# Patient Record
Sex: Male | Born: 1950 | Race: White | Hispanic: No | Smoking: Current every day smoker
Health system: Southern US, Community
[De-identification: ages and names within clinical notes are randomized; demographics above are authoritative.]

## PROBLEM LIST (undated history)

## (undated) DIAGNOSIS — G4700 Insomnia, unspecified: Secondary | ICD-10-CM

## (undated) DIAGNOSIS — K5520 Angiodysplasia of colon without hemorrhage: Secondary | ICD-10-CM

## (undated) DIAGNOSIS — E119 Type 2 diabetes mellitus without complications: Secondary | ICD-10-CM

## (undated) DIAGNOSIS — J449 Chronic obstructive pulmonary disease, unspecified: Secondary | ICD-10-CM

## (undated) DIAGNOSIS — M1990 Unspecified osteoarthritis, unspecified site: Secondary | ICD-10-CM

## (undated) DIAGNOSIS — D649 Anemia, unspecified: Secondary | ICD-10-CM

## (undated) DIAGNOSIS — I10 Essential (primary) hypertension: Secondary | ICD-10-CM

## (undated) DIAGNOSIS — E785 Hyperlipidemia, unspecified: Secondary | ICD-10-CM

## (undated) DIAGNOSIS — Z720 Tobacco use: Secondary | ICD-10-CM

## (undated) HISTORY — DX: Hyperlipidemia, unspecified: E78.5

## (undated) HISTORY — PX: TYMPANOPLASTY: SHX33

## (undated) HISTORY — PX: ROTATOR CUFF REPAIR: SHX139

## (undated) HISTORY — PX: HERNIA REPAIR: SHX51

## (undated) HISTORY — DX: Angiodysplasia of colon without hemorrhage: K55.20

## (undated) HISTORY — DX: Insomnia, unspecified: G47.00

## (undated) HISTORY — PX: COLON RESECTION: SHX5231

---

## 2004-01-25 ENCOUNTER — Ambulatory Visit (HOSPITAL_COMMUNITY): Admission: RE | Admit: 2004-01-25 | Discharge: 2004-01-25 | Payer: Self-pay | Admitting: Orthopedic Surgery

## 2006-03-04 ENCOUNTER — Ambulatory Visit: Payer: Self-pay | Admitting: *Deleted

## 2008-05-17 ENCOUNTER — Emergency Department: Payer: Self-pay | Admitting: Emergency Medicine

## 2009-09-05 ENCOUNTER — Emergency Department (HOSPITAL_COMMUNITY): Admission: EM | Admit: 2009-09-05 | Discharge: 2009-09-05 | Payer: Self-pay | Admitting: Emergency Medicine

## 2011-10-01 ENCOUNTER — Other Ambulatory Visit (HOSPITAL_COMMUNITY): Payer: Self-pay | Admitting: Family Medicine

## 2011-10-01 ENCOUNTER — Ambulatory Visit (HOSPITAL_COMMUNITY)
Admission: RE | Admit: 2011-10-01 | Discharge: 2011-10-01 | Disposition: A | Discharge status: Home / Self Care | Payer: PRIVATE HEALTH INSURANCE | Source: Ambulatory Visit | Attending: Family Medicine | Admitting: Family Medicine

## 2011-10-01 DIAGNOSIS — R0602 Shortness of breath: Secondary | ICD-10-CM | POA: Insufficient documentation

## 2011-10-01 DIAGNOSIS — F17210 Nicotine dependence, cigarettes, uncomplicated: Secondary | ICD-10-CM

## 2011-10-01 DIAGNOSIS — F17200 Nicotine dependence, unspecified, uncomplicated: Secondary | ICD-10-CM | POA: Insufficient documentation

## 2012-10-07 ENCOUNTER — Ambulatory Visit (HOSPITAL_COMMUNITY)
Admission: RE | Admit: 2012-10-07 | Discharge: 2012-10-07 | Disposition: A | Discharge status: Home / Self Care | Payer: PRIVATE HEALTH INSURANCE | Source: Ambulatory Visit | Attending: Family Medicine | Admitting: Family Medicine

## 2012-10-07 ENCOUNTER — Other Ambulatory Visit (HOSPITAL_COMMUNITY): Payer: Self-pay | Admitting: Family Medicine

## 2012-10-07 DIAGNOSIS — F17200 Nicotine dependence, unspecified, uncomplicated: Secondary | ICD-10-CM

## 2012-11-21 ENCOUNTER — Emergency Department (HOSPITAL_COMMUNITY)
Admission: EM | Admit: 2012-11-21 | Discharge: 2012-11-21 | Disposition: A | Discharge status: Home / Self Care | Payer: Medicare Other | Attending: Emergency Medicine | Admitting: Emergency Medicine

## 2012-11-21 ENCOUNTER — Emergency Department (HOSPITAL_COMMUNITY): Payer: Medicare Other

## 2012-11-21 ENCOUNTER — Encounter (HOSPITAL_COMMUNITY): Payer: Self-pay | Admitting: Emergency Medicine

## 2012-11-21 DIAGNOSIS — R059 Cough, unspecified: Secondary | ICD-10-CM | POA: Insufficient documentation

## 2012-11-21 DIAGNOSIS — F17200 Nicotine dependence, unspecified, uncomplicated: Secondary | ICD-10-CM | POA: Insufficient documentation

## 2012-11-21 DIAGNOSIS — H9190 Unspecified hearing loss, unspecified ear: Secondary | ICD-10-CM | POA: Insufficient documentation

## 2012-11-21 DIAGNOSIS — R51 Headache: Secondary | ICD-10-CM | POA: Insufficient documentation

## 2012-11-21 DIAGNOSIS — J3489 Other specified disorders of nose and nasal sinuses: Secondary | ICD-10-CM | POA: Insufficient documentation

## 2012-11-21 DIAGNOSIS — R42 Dizziness and giddiness: Secondary | ICD-10-CM | POA: Insufficient documentation

## 2012-11-21 DIAGNOSIS — H5500 Unspecified nystagmus: Secondary | ICD-10-CM | POA: Insufficient documentation

## 2012-11-21 DIAGNOSIS — R112 Nausea with vomiting, unspecified: Secondary | ICD-10-CM | POA: Insufficient documentation

## 2012-11-21 DIAGNOSIS — R05 Cough: Secondary | ICD-10-CM | POA: Insufficient documentation

## 2012-11-21 DIAGNOSIS — E119 Type 2 diabetes mellitus without complications: Secondary | ICD-10-CM | POA: Insufficient documentation

## 2012-11-21 DIAGNOSIS — Z79899 Other long term (current) drug therapy: Secondary | ICD-10-CM | POA: Insufficient documentation

## 2012-11-21 DIAGNOSIS — I10 Essential (primary) hypertension: Secondary | ICD-10-CM | POA: Insufficient documentation

## 2012-11-21 HISTORY — DX: Type 2 diabetes mellitus without complications: E11.9

## 2012-11-21 HISTORY — DX: Essential (primary) hypertension: I10

## 2012-11-21 LAB — CBC WITH DIFFERENTIAL/PLATELET
Eosinophils Absolute: 0.1 10*3/uL (ref 0.0–0.7)
Eosinophils Relative: 1 % (ref 0–5)
Hemoglobin: 14.6 g/dL (ref 13.0–17.0)
MCV: 87.2 fL (ref 78.0–100.0)
Monocytes Absolute: 0.6 10*3/uL (ref 0.1–1.0)
Monocytes Relative: 5 % (ref 3–12)
Neutro Abs: 8.8 10*3/uL — ABNORMAL HIGH (ref 1.7–7.7)
Neutrophils Relative %: 79 % — ABNORMAL HIGH (ref 43–77)
WBC: 11.2 10*3/uL — ABNORMAL HIGH (ref 4.0–10.5)

## 2012-11-21 LAB — COMPREHENSIVE METABOLIC PANEL
ALT: 20 U/L (ref 0–53)
AST: 21 U/L (ref 0–37)
Albumin: 3.8 g/dL (ref 3.5–5.2)
BUN: 11 mg/dL (ref 6–23)
Calcium: 9.3 mg/dL (ref 8.4–10.5)
GFR calc non Af Amer: >90 mL/min (ref >90)
Total Protein: 7.4 g/dL (ref 6.0–8.3)

## 2012-11-21 LAB — GLUCOSE, CAPILLARY: Glucose-Capillary: 214 mg/dL — ABNORMAL HIGH (ref 70–99)

## 2012-11-21 MED ORDER — DIAZEPAM 5 MG/ML IJ SOLN
5.0000 mg | Freq: Once | INTRAMUSCULAR | Status: AC
  Administered 2012-11-21: 5 mg via INTRAVENOUS
  Filled 2012-11-21: qty 2

## 2012-11-21 MED ORDER — ONDANSETRON HCL 4 MG/2ML IJ SOLN
4.0000 mg | Freq: Once | INTRAMUSCULAR | Status: AC
  Administered 2012-11-21: 4 mg via INTRAVENOUS
  Filled 2012-11-21: qty 2

## 2012-11-21 MED ORDER — PROMETHAZINE HCL 25 MG/ML IJ SOLN
12.5000 mg | Freq: Once | INTRAMUSCULAR | Status: AC
  Administered 2012-11-21: 12.5 mg via INTRAVENOUS
  Filled 2012-11-21: qty 1

## 2012-11-21 MED ORDER — MECLIZINE HCL 12.5 MG PO TABS
25.0000 mg | ORAL_TABLET | Freq: Once | ORAL | Status: AC
  Administered 2012-11-21: 25 mg via ORAL
  Filled 2012-11-21: qty 2

## 2012-11-21 MED ORDER — MECLIZINE HCL 25 MG PO TABS: 25.0000 mg | ORAL_TABLET | Freq: Three times a day (TID) | ORAL | Status: DC | PRN

## 2012-11-21 NOTE — ED Notes (Signed)
Pt in X ray

## 2012-11-21 NOTE — ED Notes (Signed)
Pt c/o n/dizziness x 1.5 hours. States pt seems slower that normal to respond. Denies cp/ha. Pt unable to stand due to dizziness. [id number]-[id number] in route per ems.

## 2012-11-21 NOTE — ED Provider Notes (Signed)
History    This chart was scribed for Gregory Lyons, MD by [name], ED Scribe. The patient was seen in room APA14/APA14. Patient's care was started at 1325.   CSN: [id number]  Arrival date & time 11/21/12  1322   First MD Initiated Contact with Patient 11/21/12 1325      Chief Complaint  Patient presents with  . Nausea  . Dizziness    The history is provided by the patient. No language interpreter was used.   Gregory Alvarado is a 62 y.o. male who presents to the Emergency Department complaining of constant, moderate, unchanged dizziness that started an hour and a half ago. He describes the onset as sudden while talking on the phone. He reports associated nausea and vomiting, headache on right side. Symptoms are worse with opening his eyes and improved with sitting still. He denies any tinnitus, new hearing loss, cough, congestion. He reports a h/o similar symptoms but never this severe. He has a h/o HTN and DM. He denies any cardiac hx or stents.   Past Medical History  Diagnosis Date  . Hypertension   . Diabetes mellitus without complication     Past Surgical History  Procedure Laterality Date  . Colon resection    . Rotator cuff repair      No family history on file.  History  Substance Use Topics  . Smoking status: Current Every Day Smoker -- 2.00 packs/day    Types: Cigarettes  . Smokeless tobacco: Not on file  . Alcohol Use: Yes     Comment: rare      Review of Systems  HENT: Negative for hearing loss, congestion and tinnitus.   Respiratory: Negative for cough.   Gastrointestinal: Positive for nausea and vomiting.  Neurological: Positive for dizziness and headaches.  All other systems reviewed and are negative.    Allergies  Review of patient's allergies indicates no known allergies.  Home Medications  No current outpatient prescriptions on file.  BP 196/108  Pulse 87  Temp(Src) 97.8 F (36.6 C)  Ht 6' (1.829 m)  Wt 265 lb (120.203 kg)   BMI 35.93 kg/m2  SpO2 97%  Physical Exam  Nursing note and vitals reviewed. Constitutional: He is oriented to person, place, and time. He appears well-developed and well-nourished. No distress.  HENT:  Head: Normocephalic and atraumatic.  Right Ear: External ear normal.  Left Ear: External ear normal.  Nose: Nose normal.  Mouth/Throat: Oropharynx is clear and moist. No oropharyngeal exudate.  Eyes: Conjunctivae and EOM are normal. Pupils are equal, round, and reactive to light.  Neck: Normal range of motion. Neck supple. No tracheal deviation present.  Cardiovascular: Normal rate, regular rhythm and normal heart sounds.   Pulmonary/Chest: Effort normal and breath sounds normal. No respiratory distress.  Abdominal: Soft. Bowel sounds are normal. He exhibits no distension. There is no tenderness.  Musculoskeletal: Normal range of motion. He exhibits no edema.  Neurological: He is alert and oriented to person, place, and time. No cranial nerve deficit.  Profound horizontal nystagmus noted. EOM otherwise intact. Halpike is positive. Strength 5/5 in all 4 extremities and symmetrical.   Skin: Skin is warm and dry.  Psychiatric: He has a normal mood and affect. His behavior is normal.    ED Course  Procedures (including critical care time)  DIAGNOSTIC STUDIES: Oxygen Saturation is 97% on room air, adequate by my interpretation.    COORDINATION OF CARE:  13:30-Discussed planned course of treatment with the patient including  CT of head, blood work, nausea and dizziness medication, who is agreeable at this time.   13:45-Medication Orders: Meclizine (Antivert) tablet 25 mg-once; Ondansetron (Zofran) injection 4 mg-once; Diazepam (Valium) injection 5 mg-once.    Labs Reviewed  GLUCOSE, CAPILLARY - Abnormal; Notable for the following:    Glucose-Capillary 214 (*)    All other components within normal limits   No results found.   No diagnosis found.   Date: 11/21/2012  Rate: 84   Rhythm: normal sinus rhythm  QRS Axis: normal  Intervals: normal  ST/T Wave abnormalities: normal  Conduction Disutrbances:none  Narrative Interpretation:   Old EKG Reviewed: none available    MDM  The patient presents with severe, incapacitating vertigo.  He was given the usual meds without much relief.  Workup revealed a normal head ct and labs.  As he did not improve, I proceeded to order an mri which was negative for stroke.  He is now feeling better and will be discharged with meclizine, return prn.    I personally performed the services described in this documentation, which was scribed in my presence. The recorded information has been reviewed and is accurate.        Gregory Lyons, MD 11/21/12 [phone]

## 2012-11-21 NOTE — ED Notes (Signed)
Report received 

## 2013-12-07 ENCOUNTER — Ambulatory Visit (HOSPITAL_COMMUNITY)
Admission: RE | Admit: 2013-12-07 | Discharge: 2013-12-07 | Disposition: A | Discharge status: Home / Self Care | Payer: Medicare Other | Source: Ambulatory Visit | Attending: Family Medicine | Admitting: Family Medicine

## 2013-12-07 ENCOUNTER — Encounter (HOSPITAL_COMMUNITY): Payer: Self-pay

## 2013-12-07 ENCOUNTER — Other Ambulatory Visit (HOSPITAL_COMMUNITY): Payer: Self-pay | Admitting: Family Medicine

## 2013-12-07 DIAGNOSIS — R110 Nausea: Secondary | ICD-10-CM

## 2013-12-07 DIAGNOSIS — K5730 Diverticulosis of large intestine without perforation or abscess without bleeding: Secondary | ICD-10-CM | POA: Insufficient documentation

## 2013-12-07 DIAGNOSIS — M5137 Other intervertebral disc degeneration, lumbosacral region: Secondary | ICD-10-CM | POA: Insufficient documentation

## 2013-12-07 DIAGNOSIS — M51379 Other intervertebral disc degeneration, lumbosacral region without mention of lumbar back pain or lower extremity pain: Secondary | ICD-10-CM | POA: Insufficient documentation

## 2013-12-07 DIAGNOSIS — K7689 Other specified diseases of liver: Secondary | ICD-10-CM | POA: Insufficient documentation

## 2013-12-07 DIAGNOSIS — Q762 Congenital spondylolisthesis: Secondary | ICD-10-CM | POA: Insufficient documentation

## 2013-12-07 DIAGNOSIS — R509 Fever, unspecified: Secondary | ICD-10-CM

## 2013-12-07 DIAGNOSIS — R1031 Right lower quadrant pain: Secondary | ICD-10-CM

## 2013-12-07 MED ORDER — IOHEXOL 300 MG/ML  SOLN
100.0000 mL | Freq: Once | INTRAMUSCULAR | Status: AC | PRN
  Administered 2013-12-07: 100 mL via INTRAVENOUS

## 2014-05-15 ENCOUNTER — Ambulatory Visit (HOSPITAL_COMMUNITY)
Admission: RE | Admit: 2014-05-15 | Discharge: 2014-05-15 | Disposition: A | Discharge status: Home / Self Care | Payer: Medicare Other | Source: Ambulatory Visit | Attending: Family Medicine | Admitting: Family Medicine

## 2014-05-15 ENCOUNTER — Other Ambulatory Visit (HOSPITAL_COMMUNITY): Payer: Self-pay | Admitting: Family Medicine

## 2014-05-15 DIAGNOSIS — IMO0002 Reserved for concepts with insufficient information to code with codable children: Secondary | ICD-10-CM | POA: Insufficient documentation

## 2014-05-15 DIAGNOSIS — I517 Cardiomegaly: Secondary | ICD-10-CM | POA: Diagnosis not present

## 2014-05-15 DIAGNOSIS — R059 Cough, unspecified: Secondary | ICD-10-CM | POA: Insufficient documentation

## 2014-05-15 DIAGNOSIS — R05 Cough: Secondary | ICD-10-CM | POA: Diagnosis not present

## 2014-05-15 DIAGNOSIS — F17200 Nicotine dependence, unspecified, uncomplicated: Secondary | ICD-10-CM | POA: Diagnosis not present

## 2014-05-15 DIAGNOSIS — Z87891 Personal history of nicotine dependence: Secondary | ICD-10-CM

## 2015-03-12 ENCOUNTER — Other Ambulatory Visit (HOSPITAL_COMMUNITY): Payer: Self-pay | Admitting: Family Medicine

## 2015-03-12 ENCOUNTER — Ambulatory Visit (HOSPITAL_COMMUNITY)
Admission: RE | Admit: 2015-03-12 | Discharge: 2015-03-12 | Disposition: A | Discharge status: Home / Self Care | Payer: Medicare Other | Source: Ambulatory Visit | Attending: Family Medicine | Admitting: Family Medicine

## 2015-03-12 ENCOUNTER — Other Ambulatory Visit (HOSPITAL_COMMUNITY)
Admission: RE | Admit: 2015-03-12 | Discharge: 2015-03-12 | Disposition: A | Discharge status: Home / Self Care | Payer: Medicare Other | Source: Ambulatory Visit | Attending: Family Medicine | Admitting: Family Medicine

## 2015-03-12 DIAGNOSIS — K5790 Diverticulosis of intestine, part unspecified, without perforation or abscess without bleeding: Secondary | ICD-10-CM

## 2015-03-12 DIAGNOSIS — R197 Diarrhea, unspecified: Secondary | ICD-10-CM | POA: Insufficient documentation

## 2015-03-12 DIAGNOSIS — R933 Abnormal findings on diagnostic imaging of other parts of digestive tract: Secondary | ICD-10-CM | POA: Diagnosis not present

## 2015-03-12 DIAGNOSIS — R1084 Generalized abdominal pain: Secondary | ICD-10-CM | POA: Diagnosis present

## 2015-03-12 DIAGNOSIS — K5730 Diverticulosis of large intestine without perforation or abscess without bleeding: Secondary | ICD-10-CM | POA: Insufficient documentation

## 2015-03-12 LAB — CBC WITH DIFFERENTIAL/PLATELET
BASOS PCT: 0 % (ref 0–1)
Basophils Absolute: 0 10*3/uL (ref 0.0–0.1)
EOS ABS: 0.1 10*3/uL (ref 0.0–0.7)
Eosinophils Relative: 1 % (ref 0–5)
HCT: 41.3 % (ref 39.0–52.0)
Hemoglobin: 13.2 g/dL (ref 13.0–17.0)
Lymphocytes Relative: 7 % — ABNORMAL LOW (ref 12–46)
Lymphs Abs: 0.9 10*3/uL (ref 0.7–4.0)
MCH: 27.7 pg (ref 26.0–34.0)
MCHC: 32 g/dL (ref 30.0–36.0)
MCV: 86.8 fL (ref 78.0–100.0)
Monocytes Absolute: 0.9 10*3/uL (ref 0.1–1.0)
Monocytes Relative: 7 % (ref 3–12)
Neutro Abs: 10.3 10*3/uL — ABNORMAL HIGH (ref 1.7–7.7)
Neutrophils Relative %: 85 % — ABNORMAL HIGH (ref 43–77)
PLATELETS: 235 10*3/uL (ref 150–400)
RBC: 4.76 MIL/uL (ref 4.22–5.81)
RDW: 14.4 % (ref 11.5–15.5)
WBC: 12.2 10*3/uL — ABNORMAL HIGH (ref 4.0–10.5)

## 2015-03-12 LAB — BASIC METABOLIC PANEL
Anion gap: 8 (ref 5–15)
BUN: 9 mg/dL (ref 6–20)
CALCIUM: 8.9 mg/dL (ref 8.9–10.3)
CHLORIDE: 101 mmol/L (ref 101–111)
CO2: 29 mmol/L (ref 22–32)
Creatinine, Ser: 0.86 mg/dL (ref 0.61–1.24)
GFR calc non Af Amer: >60 mL/min (ref >60)
Glucose, Bld: 123 mg/dL — ABNORMAL HIGH (ref 65–99)
POTASSIUM: 4.4 mmol/L (ref 3.5–5.1)
Sodium: 138 mmol/L (ref 135–145)

## 2015-03-12 MED ORDER — IOHEXOL 300 MG/ML  SOLN
100.0000 mL | Freq: Once | INTRAMUSCULAR | Status: AC | PRN
  Administered 2015-03-12: 100 mL via INTRAVENOUS

## 2015-06-19 DIAGNOSIS — M75102 Unspecified rotator cuff tear or rupture of left shoulder, not specified as traumatic: Secondary | ICD-10-CM | POA: Diagnosis not present

## 2015-06-24 ENCOUNTER — Ambulatory Visit (HOSPITAL_COMMUNITY)
Admission: RE | Admit: 2015-06-24 | Discharge: 2015-06-24 | Disposition: A | Discharge status: Home / Self Care | Payer: Medicare Other | Source: Ambulatory Visit | Attending: Family Medicine | Admitting: Family Medicine

## 2015-06-24 ENCOUNTER — Other Ambulatory Visit (HOSPITAL_COMMUNITY): Payer: Self-pay | Admitting: Family Medicine

## 2015-06-24 DIAGNOSIS — M25551 Pain in right hip: Secondary | ICD-10-CM | POA: Insufficient documentation

## 2015-06-26 DIAGNOSIS — M25551 Pain in right hip: Secondary | ICD-10-CM | POA: Diagnosis not present

## 2015-06-26 DIAGNOSIS — S76311A Strain of muscle, fascia and tendon of the posterior muscle group at thigh level, right thigh, initial encounter: Secondary | ICD-10-CM | POA: Diagnosis not present

## 2015-07-04 DIAGNOSIS — I10 Essential (primary) hypertension: Secondary | ICD-10-CM | POA: Diagnosis not present

## 2015-07-04 DIAGNOSIS — E119 Type 2 diabetes mellitus without complications: Secondary | ICD-10-CM | POA: Diagnosis not present

## 2015-07-04 DIAGNOSIS — Z6834 Body mass index (BMI) 34.0-34.9, adult: Secondary | ICD-10-CM | POA: Diagnosis not present

## 2015-07-04 DIAGNOSIS — M7582 Other shoulder lesions, left shoulder: Secondary | ICD-10-CM | POA: Diagnosis not present

## 2015-07-04 DIAGNOSIS — Z1389 Encounter for screening for other disorder: Secondary | ICD-10-CM | POA: Diagnosis not present

## 2015-07-04 DIAGNOSIS — E782 Mixed hyperlipidemia: Secondary | ICD-10-CM | POA: Diagnosis not present

## 2015-07-10 DIAGNOSIS — S76311D Strain of muscle, fascia and tendon of the posterior muscle group at thigh level, right thigh, subsequent encounter: Secondary | ICD-10-CM | POA: Diagnosis not present

## 2015-07-10 DIAGNOSIS — M75102 Unspecified rotator cuff tear or rupture of left shoulder, not specified as traumatic: Secondary | ICD-10-CM | POA: Diagnosis not present

## 2015-07-17 DIAGNOSIS — M25512 Pain in left shoulder: Secondary | ICD-10-CM | POA: Diagnosis not present

## 2015-07-19 DIAGNOSIS — M5431 Sciatica, right side: Secondary | ICD-10-CM | POA: Diagnosis not present

## 2015-07-19 DIAGNOSIS — S76311D Strain of muscle, fascia and tendon of the posterior muscle group at thigh level, right thigh, subsequent encounter: Secondary | ICD-10-CM | POA: Diagnosis not present

## 2015-07-19 DIAGNOSIS — M75102 Unspecified rotator cuff tear or rupture of left shoulder, not specified as traumatic: Secondary | ICD-10-CM | POA: Diagnosis not present

## 2015-08-01 DIAGNOSIS — M7522 Bicipital tendinitis, left shoulder: Secondary | ICD-10-CM | POA: Diagnosis not present

## 2015-08-02 DIAGNOSIS — M75122 Complete rotator cuff tear or rupture of left shoulder, not specified as traumatic: Secondary | ICD-10-CM | POA: Diagnosis not present

## 2015-08-04 DIAGNOSIS — I10 Essential (primary) hypertension: Secondary | ICD-10-CM | POA: Diagnosis not present

## 2015-08-20 DIAGNOSIS — Z6834 Body mass index (BMI) 34.0-34.9, adult: Secondary | ICD-10-CM | POA: Diagnosis not present

## 2015-08-20 DIAGNOSIS — J180 Bronchopneumonia, unspecified organism: Secondary | ICD-10-CM | POA: Diagnosis not present

## 2015-08-20 DIAGNOSIS — Z0001 Encounter for general adult medical examination with abnormal findings: Secondary | ICD-10-CM | POA: Diagnosis not present

## 2015-08-20 DIAGNOSIS — I10 Essential (primary) hypertension: Secondary | ICD-10-CM | POA: Diagnosis not present

## 2015-08-20 DIAGNOSIS — E6609 Other obesity due to excess calories: Secondary | ICD-10-CM | POA: Diagnosis not present

## 2015-08-20 DIAGNOSIS — E1165 Type 2 diabetes mellitus with hyperglycemia: Secondary | ICD-10-CM | POA: Diagnosis not present

## 2015-08-20 DIAGNOSIS — Z1389 Encounter for screening for other disorder: Secondary | ICD-10-CM | POA: Diagnosis not present

## 2015-08-20 DIAGNOSIS — E782 Mixed hyperlipidemia: Secondary | ICD-10-CM | POA: Diagnosis not present

## 2015-09-04 ENCOUNTER — Other Ambulatory Visit: Payer: Self-pay | Admitting: Orthopedic Surgery

## 2015-09-04 DIAGNOSIS — M75122 Complete rotator cuff tear or rupture of left shoulder, not specified as traumatic: Secondary | ICD-10-CM | POA: Diagnosis not present

## 2015-09-18 ENCOUNTER — Encounter (HOSPITAL_BASED_OUTPATIENT_CLINIC_OR_DEPARTMENT_OTHER): Payer: Self-pay | Admitting: *Deleted

## 2015-09-23 ENCOUNTER — Ambulatory Visit (HOSPITAL_BASED_OUTPATIENT_CLINIC_OR_DEPARTMENT_OTHER): Admission: RE | Admit: 2015-09-23 | Payer: Medicare Other | Source: Ambulatory Visit | Admitting: Orthopedic Surgery

## 2015-09-23 HISTORY — DX: Unspecified osteoarthritis, unspecified site: M19.90

## 2015-09-23 HISTORY — DX: Chronic obstructive pulmonary disease, unspecified: J44.9

## 2015-09-23 SURGERY — SHOULDER ARTHROSCOPY WITH BICEPS TENOTOMY
Anesthesia: Choice | Laterality: Left

## 2015-09-24 DIAGNOSIS — M75112 Incomplete rotator cuff tear or rupture of left shoulder, not specified as traumatic: Secondary | ICD-10-CM | POA: Diagnosis not present

## 2015-09-24 DIAGNOSIS — M75122 Complete rotator cuff tear or rupture of left shoulder, not specified as traumatic: Secondary | ICD-10-CM | POA: Diagnosis not present

## 2015-09-24 DIAGNOSIS — M66322 Spontaneous rupture of flexor tendons, left upper arm: Secondary | ICD-10-CM | POA: Diagnosis not present

## 2015-09-24 DIAGNOSIS — M66812 Spontaneous rupture of other tendons, left shoulder: Secondary | ICD-10-CM | POA: Diagnosis not present

## 2015-09-24 DIAGNOSIS — M24112 Other articular cartilage disorders, left shoulder: Secondary | ICD-10-CM | POA: Diagnosis not present

## 2015-09-24 DIAGNOSIS — M659 Synovitis and tenosynovitis, unspecified: Secondary | ICD-10-CM | POA: Diagnosis not present

## 2015-09-24 DIAGNOSIS — M94212 Chondromalacia, left shoulder: Secondary | ICD-10-CM | POA: Diagnosis not present

## 2015-09-24 DIAGNOSIS — G8918 Other acute postprocedural pain: Secondary | ICD-10-CM | POA: Diagnosis not present

## 2015-11-05 DIAGNOSIS — I10 Essential (primary) hypertension: Secondary | ICD-10-CM | POA: Diagnosis not present

## 2015-11-05 DIAGNOSIS — E1165 Type 2 diabetes mellitus with hyperglycemia: Secondary | ICD-10-CM | POA: Diagnosis not present

## 2015-11-05 DIAGNOSIS — Z1389 Encounter for screening for other disorder: Secondary | ICD-10-CM | POA: Diagnosis not present

## 2015-11-05 DIAGNOSIS — E782 Mixed hyperlipidemia: Secondary | ICD-10-CM | POA: Diagnosis not present

## 2015-12-11 DIAGNOSIS — Z9889 Other specified postprocedural states: Secondary | ICD-10-CM | POA: Diagnosis not present

## 2015-12-23 DIAGNOSIS — Z1389 Encounter for screening for other disorder: Secondary | ICD-10-CM | POA: Diagnosis not present

## 2015-12-23 DIAGNOSIS — E1129 Type 2 diabetes mellitus with other diabetic kidney complication: Secondary | ICD-10-CM | POA: Diagnosis not present

## 2015-12-23 DIAGNOSIS — E1165 Type 2 diabetes mellitus with hyperglycemia: Secondary | ICD-10-CM | POA: Diagnosis not present

## 2016-01-01 DIAGNOSIS — Z1389 Encounter for screening for other disorder: Secondary | ICD-10-CM | POA: Diagnosis not present

## 2016-01-01 DIAGNOSIS — E1165 Type 2 diabetes mellitus with hyperglycemia: Secondary | ICD-10-CM | POA: Diagnosis not present

## 2016-01-01 DIAGNOSIS — N289 Disorder of kidney and ureter, unspecified: Secondary | ICD-10-CM | POA: Diagnosis not present

## 2016-02-10 DIAGNOSIS — I10 Essential (primary) hypertension: Secondary | ICD-10-CM | POA: Diagnosis not present

## 2016-02-10 DIAGNOSIS — N179 Acute kidney failure, unspecified: Secondary | ICD-10-CM | POA: Diagnosis not present

## 2016-02-10 DIAGNOSIS — Z720 Tobacco use: Secondary | ICD-10-CM | POA: Diagnosis not present

## 2016-02-10 DIAGNOSIS — E1129 Type 2 diabetes mellitus with other diabetic kidney complication: Secondary | ICD-10-CM | POA: Diagnosis not present

## 2016-02-12 ENCOUNTER — Ambulatory Visit (INDEPENDENT_AMBULATORY_CARE_PROVIDER_SITE_OTHER): Payer: Medicare Other | Admitting: Endocrinology

## 2016-02-12 ENCOUNTER — Encounter: Payer: Self-pay | Admitting: Endocrinology

## 2016-02-12 VITALS — BP 126/70 | HR 104 | Ht 72.0 in | Wt 256.0 lb

## 2016-02-12 DIAGNOSIS — I10 Essential (primary) hypertension: Secondary | ICD-10-CM | POA: Diagnosis not present

## 2016-02-12 DIAGNOSIS — E119 Type 2 diabetes mellitus without complications: Secondary | ICD-10-CM | POA: Diagnosis not present

## 2016-02-12 DIAGNOSIS — M1990 Unspecified osteoarthritis, unspecified site: Secondary | ICD-10-CM

## 2016-02-12 DIAGNOSIS — J449 Chronic obstructive pulmonary disease, unspecified: Secondary | ICD-10-CM | POA: Diagnosis not present

## 2016-02-12 DIAGNOSIS — E785 Hyperlipidemia, unspecified: Secondary | ICD-10-CM

## 2016-02-12 DIAGNOSIS — G4700 Insomnia, unspecified: Secondary | ICD-10-CM

## 2016-02-12 NOTE — Patient Instructions (Addendum)
good diet and exercise significantly improve the control of your diabetes.  please let me know if you wish to be referred to a dietician.  high blood sugar is very risky to your health.  you should see an eye doctor and dentist every year.  It is very important to get all recommended vaccinations.  controlling your blood pressure and cholesterol drastically reduces the damage diabetes does to your body.  Those who smoke should quit.  please discuss these with your doctor.  check your blood sugar once a day.  vary the time of day when you check, between before the 3 meals, and at bedtime.  also check if you have symptoms of your blood sugar being too high or too low.  please keep a record of the readings and bring it to your next appointment here (or you can bring the meter itself).  You can write it on any piece of paper.  please call us sooner if your blood sugar goes below 70, or if you have a lot of readings over 200. If this happens, treatment options would be repaglinide, insulin, bromocriptine, or pioglitizone.  You has a very high risk for heart problems, so you should strongly consider having a treadmill test.  Please let Dr Hilma Favors know if you decide to do it.   Please come back for a follow-up appointment in 3 months.

## 2016-02-12 NOTE — Progress Notes (Signed)
Subjective:    Patient ID: Gregory Alvarado, male    DOB: [birth year], 65 y.o.   MRN: [id number]  HPI pt states DM was dx'ed in 2012; he has mild neuropathy of the lower extremities, and associated renal insufficiency; he has never been on insulin; pt says his diet and exercise are good; he has never had pancreatitis, severe hypoglycemia or DKA.  He took victoza for a short time in 2016, but stopped due to cost.  he brings a record of his cbg's which i have reviewed today, checked in am.  It varies from 100-150.  He stopped metformin 2 weeks ago, and stopped glimepiride 2 days ago.  He says he cannot afford brand name meds Past Medical History  Diagnosis Date  . Hypertension   . Diabetes mellitus without complication ([place])   . COPD (chronic obstructive pulmonary disease) ([place])   . Arthritis     left shoulder  . Dyslipidemia   . Insomnia     Past Surgical History  Procedure Laterality Date  . Colon resection      DIVERTICULITIS  . Rotator cuff repair Bilateral   . Tympanoplasty    . Hernia repair      UHR    Social History   Social History  . Marital Status: Divorced    Spouse Name: N/A  . Number of Children: N/A  . Years of Education: N/A   Occupational History  . Not on file.   Social History Main Topics  . Smoking status: Current Every Day Smoker -- 2.00 packs/day    Types: Cigarettes  . Smokeless tobacco: Not on file  . Alcohol Use: Yes     Comment: rare  . Drug Use: No  . Sexual Activity: Not on file   Other Topics Concern  . Not on file   Social History Narrative    Current Outpatient Prescriptions on File Prior to Visit  Medication Sig Dispense Refill  . albuterol (PROVENTIL HFA;VENTOLIN HFA) 108 (90 Base) MCG/ACT inhaler Inhale into the lungs every 6 (six) hours as needed for wheezing or shortness of breath.    Marland Kitchen aspirin EC 81 MG tablet Take 81 mg by mouth daily.    . fenofibrate (TRICOR) 145 MG tablet Take 145 mg by mouth daily.    Marland Kitchen  HYDROcodone-acetaminophen (NORCO/VICODIN) 5-325 MG tablet Take 1 tablet by mouth every 6 (six) hours as needed for moderate pain.    . simvastatin (ZOCOR) 40 MG tablet Take 40 mg by mouth daily.    . vitamin B-12 (CYANOCOBALAMIN) 250 MCG tablet Take 250 mcg by mouth daily.    Marland Kitchen zolpidem (AMBIEN) 10 MG tablet Take 5-10 mg by mouth at bedtime as needed for sleep.    . vitamin E 100 UNIT capsule Take by mouth daily. Reported on 02/12/2016     No current facility-administered medications on file prior to visit.    Allergies  Allergen Reactions  . Caduet [Amlodipine-Atorvastatin]     swelling    Family History  Problem Relation Age of Onset  . Diabetes Mother     BP 126/70 mmHg  Pulse 104  Ht 6' (1.829 m)  Wt 256 lb (116.121 kg)  BMI 34.71 kg/m2  SpO2 96%   Review of Systems denies blurry vision, headache, chest pain, n/v, muscle cramps, excessive diaphoresis, depression, cold intolerance, rhinorrhea, and easy bruising.  He has urinary frequency, easy bruising, doe, and weight gain    Objective:   Physical Exam VS: see vs page  GEN: no distress HEAD: head: no deformity eyes: no periorbital swelling, no proptosis external nose and ears are normal mouth: no lesion seen NECK: supple, thyroid is not enlarged CHEST WALL: no deformity LUNGS: clear to auscultation CV: reg rate and rhythm, no murmur ABD: abdomen is soft, nontender.  no hepatosplenomegaly.  not distended.  no hernia MUSCULOSKELETAL: muscle bulk and strength are grossly normal.  no obvious joint swelling.  gait is normal and steady EXTEMITIES: no deformity.  no ulcer on the feet.  feet are of normal color and temp.  no edema PULSES: dorsalis pedis intact bilat.  no carotid bruit NEURO:  cn 2-12 grossly intact.   readily moves all 4's.  sensation is intact to touch on the feet SKIN:  Normal texture and temperature.  No rash or suspicious lesion is visible.   NODES:  None palpable at the neck PSYCH: alert,  well-oriented.  Does not appear anxious nor depressed.     outside test results are reviewed: A1c=8.1%  I have reviewed outside records, and summarized: Pt was noted to have elevated a1c, and referred here.  He was also noted to have renal insufficiency, so meds were adjusted  outside test results are reviewed: Creat=2    Assessment & Plan:  Doe: he declines treadmill for now.  Renal insufficiency: this limits oral DM rx options.  Type 2 DM: he declines meds for now.   Patient is advised the following: Patient Instructions  good diet and exercise significantly improve the control of your diabetes.  please let me know if you wish to be referred to a dietician.  high blood sugar is very risky to your health.  you should see an eye doctor and dentist every year.  It is very important to get all recommended vaccinations.  controlling your blood pressure and cholesterol drastically reduces the damage diabetes does to your body.  Those who smoke should quit.  please discuss these with your doctor.  check your blood sugar once a day.  vary the time of day when you check, between before the 3 meals, and at bedtime.  also check if you have symptoms of your blood sugar being too high or too low.  please keep a record of the readings and bring it to your next appointment here (or you can bring the meter itself).  You can write it on any piece of paper.  please call us sooner if your blood sugar goes below 70, or if you have a lot of readings over 200. If this happens, treatment options would be repaglinide, insulin, bromocriptine, or pioglitizone.  You has a very high risk for heart problems, so you should strongly consider having a treadmill test.  Please let Dr Hilma Favors know if you decide to do it.   Please come back for a follow-up appointment in 3 months.      Renato Shin, MD

## 2016-02-13 ENCOUNTER — Encounter: Payer: Self-pay | Admitting: Endocrinology

## 2016-02-13 DIAGNOSIS — E1122 Type 2 diabetes mellitus with diabetic chronic kidney disease: Secondary | ICD-10-CM | POA: Insufficient documentation

## 2016-02-13 DIAGNOSIS — J449 Chronic obstructive pulmonary disease, unspecified: Secondary | ICD-10-CM | POA: Insufficient documentation

## 2016-02-13 DIAGNOSIS — G4700 Insomnia, unspecified: Secondary | ICD-10-CM | POA: Insufficient documentation

## 2016-02-13 DIAGNOSIS — J441 Chronic obstructive pulmonary disease with (acute) exacerbation: Secondary | ICD-10-CM | POA: Insufficient documentation

## 2016-02-13 DIAGNOSIS — I10 Essential (primary) hypertension: Secondary | ICD-10-CM | POA: Insufficient documentation

## 2016-02-13 DIAGNOSIS — E785 Hyperlipidemia, unspecified: Secondary | ICD-10-CM | POA: Insufficient documentation

## 2016-02-13 DIAGNOSIS — E119 Type 2 diabetes mellitus without complications: Secondary | ICD-10-CM | POA: Insufficient documentation

## 2016-02-13 DIAGNOSIS — E782 Mixed hyperlipidemia: Secondary | ICD-10-CM | POA: Insufficient documentation

## 2016-02-13 DIAGNOSIS — E1165 Type 2 diabetes mellitus with hyperglycemia: Secondary | ICD-10-CM | POA: Insufficient documentation

## 2016-02-13 DIAGNOSIS — N183 Chronic kidney disease, stage 3 (moderate): Secondary | ICD-10-CM

## 2016-02-13 DIAGNOSIS — M1990 Unspecified osteoarthritis, unspecified site: Secondary | ICD-10-CM | POA: Insufficient documentation

## 2016-02-20 ENCOUNTER — Other Ambulatory Visit (HOSPITAL_COMMUNITY): Payer: Self-pay | Admitting: Medical

## 2016-02-20 DIAGNOSIS — N183 Chronic kidney disease, stage 3 unspecified: Secondary | ICD-10-CM

## 2016-02-27 ENCOUNTER — Telehealth: Payer: Self-pay

## 2016-02-27 NOTE — Telephone Encounter (Signed)
PATIENT CALLED TO SET UP TCS  RECEIVED LETTER   [phone]

## 2016-03-05 NOTE — Telephone Encounter (Signed)
LMOM to call.

## 2016-03-06 ENCOUNTER — Ambulatory Visit (HOSPITAL_COMMUNITY)
Admission: RE | Admit: 2016-03-06 | Discharge: 2016-03-06 | Disposition: A | Discharge status: Home / Self Care | Payer: Medicare Other | Source: Ambulatory Visit | Attending: Medical | Admitting: Medical

## 2016-03-06 DIAGNOSIS — E559 Vitamin D deficiency, unspecified: Secondary | ICD-10-CM | POA: Diagnosis not present

## 2016-03-06 DIAGNOSIS — I10 Essential (primary) hypertension: Secondary | ICD-10-CM | POA: Diagnosis not present

## 2016-03-06 DIAGNOSIS — R809 Proteinuria, unspecified: Secondary | ICD-10-CM | POA: Diagnosis not present

## 2016-03-06 DIAGNOSIS — N183 Chronic kidney disease, stage 3 unspecified: Secondary | ICD-10-CM

## 2016-03-06 DIAGNOSIS — Z79899 Other long term (current) drug therapy: Secondary | ICD-10-CM | POA: Diagnosis not present

## 2016-03-12 DIAGNOSIS — I10 Essential (primary) hypertension: Secondary | ICD-10-CM | POA: Diagnosis not present

## 2016-03-12 DIAGNOSIS — Z1389 Encounter for screening for other disorder: Secondary | ICD-10-CM | POA: Diagnosis not present

## 2016-03-12 DIAGNOSIS — E782 Mixed hyperlipidemia: Secondary | ICD-10-CM | POA: Diagnosis not present

## 2016-03-12 NOTE — Telephone Encounter (Signed)
Mailed letter to call.  

## 2016-04-01 DIAGNOSIS — R809 Proteinuria, unspecified: Secondary | ICD-10-CM | POA: Diagnosis not present

## 2016-04-01 DIAGNOSIS — D649 Anemia, unspecified: Secondary | ICD-10-CM | POA: Diagnosis not present

## 2016-04-01 DIAGNOSIS — E559 Vitamin D deficiency, unspecified: Secondary | ICD-10-CM | POA: Diagnosis not present

## 2016-04-01 DIAGNOSIS — N183 Chronic kidney disease, stage 3 (moderate): Secondary | ICD-10-CM | POA: Diagnosis not present

## 2016-04-14 DIAGNOSIS — E119 Type 2 diabetes mellitus without complications: Secondary | ICD-10-CM | POA: Diagnosis not present

## 2016-05-13 ENCOUNTER — Ambulatory Visit: Payer: Medicare Other | Admitting: Endocrinology

## 2016-07-06 DIAGNOSIS — N183 Chronic kidney disease, stage 3 (moderate): Secondary | ICD-10-CM | POA: Diagnosis not present

## 2016-07-06 DIAGNOSIS — E1129 Type 2 diabetes mellitus with other diabetic kidney complication: Secondary | ICD-10-CM | POA: Diagnosis not present

## 2016-07-06 DIAGNOSIS — E1165 Type 2 diabetes mellitus with hyperglycemia: Secondary | ICD-10-CM | POA: Diagnosis not present

## 2016-07-06 DIAGNOSIS — I10 Essential (primary) hypertension: Secondary | ICD-10-CM | POA: Diagnosis not present

## 2016-07-06 DIAGNOSIS — E782 Mixed hyperlipidemia: Secondary | ICD-10-CM | POA: Diagnosis not present

## 2016-07-06 DIAGNOSIS — Z1389 Encounter for screening for other disorder: Secondary | ICD-10-CM | POA: Diagnosis not present

## 2016-07-10 DIAGNOSIS — E559 Vitamin D deficiency, unspecified: Secondary | ICD-10-CM | POA: Diagnosis not present

## 2016-07-10 DIAGNOSIS — R809 Proteinuria, unspecified: Secondary | ICD-10-CM | POA: Diagnosis not present

## 2016-07-10 DIAGNOSIS — Z79899 Other long term (current) drug therapy: Secondary | ICD-10-CM | POA: Diagnosis not present

## 2016-07-10 DIAGNOSIS — D509 Iron deficiency anemia, unspecified: Secondary | ICD-10-CM | POA: Diagnosis not present

## 2016-07-10 DIAGNOSIS — N183 Chronic kidney disease, stage 3 (moderate): Secondary | ICD-10-CM | POA: Diagnosis not present

## 2016-07-15 DIAGNOSIS — I10 Essential (primary) hypertension: Secondary | ICD-10-CM | POA: Diagnosis not present

## 2016-07-15 DIAGNOSIS — N183 Chronic kidney disease, stage 3 (moderate): Secondary | ICD-10-CM | POA: Diagnosis not present

## 2016-07-15 DIAGNOSIS — D509 Iron deficiency anemia, unspecified: Secondary | ICD-10-CM | POA: Diagnosis not present

## 2016-07-15 DIAGNOSIS — E1129 Type 2 diabetes mellitus with other diabetic kidney complication: Secondary | ICD-10-CM | POA: Diagnosis not present

## 2016-07-15 DIAGNOSIS — N250 Renal osteodystrophy: Secondary | ICD-10-CM | POA: Diagnosis not present

## 2016-08-04 DIAGNOSIS — M545 Low back pain: Secondary | ICD-10-CM | POA: Diagnosis not present

## 2016-08-04 DIAGNOSIS — M7062 Trochanteric bursitis, left hip: Secondary | ICD-10-CM | POA: Diagnosis not present

## 2016-08-04 DIAGNOSIS — M7061 Trochanteric bursitis, right hip: Secondary | ICD-10-CM | POA: Diagnosis not present

## 2016-09-02 DIAGNOSIS — L820 Inflamed seborrheic keratosis: Secondary | ICD-10-CM | POA: Diagnosis not present

## 2016-11-03 DIAGNOSIS — E1121 Type 2 diabetes mellitus with diabetic nephropathy: Secondary | ICD-10-CM | POA: Diagnosis not present

## 2016-11-03 LAB — HEMOGLOBIN A1C: HEMOGLOBIN A1C: 13.2

## 2016-11-09 DIAGNOSIS — R002 Palpitations: Secondary | ICD-10-CM | POA: Diagnosis not present

## 2016-11-09 DIAGNOSIS — R008 Other abnormalities of heart beat: Secondary | ICD-10-CM | POA: Diagnosis not present

## 2016-11-09 DIAGNOSIS — E1129 Type 2 diabetes mellitus with other diabetic kidney complication: Secondary | ICD-10-CM | POA: Diagnosis not present

## 2016-11-25 DIAGNOSIS — Z79899 Other long term (current) drug therapy: Secondary | ICD-10-CM | POA: Diagnosis not present

## 2016-11-25 DIAGNOSIS — R809 Proteinuria, unspecified: Secondary | ICD-10-CM | POA: Diagnosis not present

## 2016-11-25 DIAGNOSIS — N183 Chronic kidney disease, stage 3 (moderate): Secondary | ICD-10-CM | POA: Diagnosis not present

## 2016-11-25 DIAGNOSIS — D509 Iron deficiency anemia, unspecified: Secondary | ICD-10-CM | POA: Diagnosis not present

## 2016-11-25 DIAGNOSIS — E559 Vitamin D deficiency, unspecified: Secondary | ICD-10-CM | POA: Diagnosis not present

## 2016-11-27 DIAGNOSIS — E871 Hypo-osmolality and hyponatremia: Secondary | ICD-10-CM | POA: Diagnosis not present

## 2016-11-27 DIAGNOSIS — R809 Proteinuria, unspecified: Secondary | ICD-10-CM | POA: Diagnosis not present

## 2016-11-27 DIAGNOSIS — N183 Chronic kidney disease, stage 3 (moderate): Secondary | ICD-10-CM | POA: Diagnosis not present

## 2016-11-27 DIAGNOSIS — E559 Vitamin D deficiency, unspecified: Secondary | ICD-10-CM | POA: Diagnosis not present

## 2016-12-21 ENCOUNTER — Ambulatory Visit (INDEPENDENT_AMBULATORY_CARE_PROVIDER_SITE_OTHER): Payer: Medicare Other | Admitting: "Endocrinology

## 2016-12-21 ENCOUNTER — Encounter: Payer: Self-pay | Admitting: "Endocrinology

## 2016-12-21 VITALS — BP 121/77 | HR 102 | Ht 72.0 in | Wt 262.0 lb

## 2016-12-21 DIAGNOSIS — E1122 Type 2 diabetes mellitus with diabetic chronic kidney disease: Secondary | ICD-10-CM

## 2016-12-21 DIAGNOSIS — E6609 Other obesity due to excess calories: Secondary | ICD-10-CM | POA: Diagnosis not present

## 2016-12-21 DIAGNOSIS — N183 Chronic kidney disease, stage 3 (moderate): Secondary | ICD-10-CM | POA: Diagnosis not present

## 2016-12-21 DIAGNOSIS — IMO0001 Reserved for inherently not codable concepts without codable children: Secondary | ICD-10-CM

## 2016-12-21 DIAGNOSIS — Z6835 Body mass index (BMI) 35.0-35.9, adult: Secondary | ICD-10-CM

## 2016-12-21 DIAGNOSIS — I10 Essential (primary) hypertension: Secondary | ICD-10-CM

## 2016-12-21 DIAGNOSIS — E782 Mixed hyperlipidemia: Secondary | ICD-10-CM | POA: Diagnosis not present

## 2016-12-21 MED ORDER — ONETOUCH ULTRA BLUE VI STRP: 1.0000 | ORAL_STRIP | Freq: Every day | 2 refills | Status: DC

## 2016-12-21 MED ORDER — SITAGLIPTIN PHOSPHATE 25 MG PO TABS: 25.0000 mg | ORAL_TABLET | Freq: Every day | ORAL | 2 refills | Status: DC

## 2016-12-21 NOTE — Progress Notes (Signed)
Subjective:    Patient ID: Gregory Alvarado, male    DOB: 1950/09/30. Patient is being seen in consultation for management of diabetes requested by  Gregory Herring, MD  Past Medical History:  Diagnosis Date  . Arthritis    left shoulder  . COPD (chronic obstructive pulmonary disease) ([place])   . Diabetes mellitus without complication ([place])   . Dyslipidemia   . Hypertension   . Insomnia    Past Surgical History:  Procedure Laterality Date  . COLON RESECTION     DIVERTICULITIS  . HERNIA REPAIR     UHR  . ROTATOR CUFF REPAIR Bilateral   . TYMPANOPLASTY     Social History   Social History  . Marital status: Divorced    Spouse name: N/A  . Number of children: N/A  . Years of education: N/A   Social History Main Topics  . Smoking status: Current Every Day Smoker    Packs/day: 3.00    Types: Cigarettes  . Smokeless tobacco: Never Used  . Alcohol use No     Comment: rare  . Drug use: No  . Sexual activity: Not Asked   Other Topics Concern  . None   Social History Narrative  . None   Outpatient Encounter Prescriptions as of 12/21/2016  Medication Sig  . glimepiride (AMARYL) 4 MG tablet Take 4 mg by mouth daily with breakfast.  . albuterol (PROVENTIL HFA;VENTOLIN HFA) 108 (90 Base) MCG/ACT inhaler Inhale into the lungs every 6 (six) hours as needed for wheezing or shortness of breath.  Marland Kitchen aspirin EC 81 MG tablet Take 81 mg by mouth daily.  . fenofibrate (TRICOR) 145 MG tablet Take 145 mg by mouth daily.  Marland Kitchen HYDROcodone-acetaminophen (NORCO/VICODIN) 5-325 MG tablet Take 1 tablet by mouth every 6 (six) hours as needed for moderate pain.  . ONE TOUCH ULTRA TEST test strip 1 each by Other route daily. And lancets 4/day  . simvastatin (ZOCOR) 40 MG tablet Take 40 mg by mouth daily.  . sitaGLIPtin (JANUVIA) 25 MG tablet Take 1 tablet (25 mg total) by mouth daily.  . vitamin B-12 (CYANOCOBALAMIN) 250 MCG tablet Take 250 mcg by mouth daily.  . vitamin E 100 UNIT capsule Take by  mouth daily. Reported on 02/12/2016  . zolpidem (AMBIEN) 10 MG tablet Take 5-10 mg by mouth at bedtime as needed for sleep.  . [DISCONTINUED] ONE TOUCH ULTRA TEST test strip 1 each by Other route daily. And lancets 1/day   No facility-administered encounter medications on file as of 64/05/2017.    ALLERGIES: Allergies  Allergen Reactions  . Caduet [Amlodipine-Atorvastatin]     swelling   VACCINATION STATUS:  There is no immunization history on file for this patient.  Diabetes  He presents for his initial diabetic visit. He has type 2 diabetes mellitus. Onset time: He was diagnosed at approximate age of 66 years. His disease course has been worsening. There are no hypoglycemic associated symptoms. Pertinent negatives for hypoglycemia include no confusion, headaches, pallor or seizures. Associated symptoms include blurred vision, foot paresthesias, polydipsia and polyuria. Pertinent negatives for diabetes include no chest pain, no fatigue, no polyphagia and no weakness. There are no hypoglycemic complications. Symptoms are worsening. Diabetic complications include nephropathy. Risk factors for coronary artery disease include diabetes mellitus, dyslipidemia, hypertension, male sex, sedentary lifestyle, tobacco exposure, obesity and family history. Current diabetic treatment includes oral agent (monotherapy) (He is taking glimepiride 4 mg by mouth twice a day.). His weight is increasing  steadily. He is following a generally unhealthy diet. When asked about meal planning, he reported none. He has not had a previous visit with a dietitian. He never participates in exercise. (He did not bring any meter nor logs to review today. He admits that he is not monitoring blood glucose regularly. ) An ACE inhibitor/angiotensin II receptor blocker is not being taken.  Hyperlipidemia  This is a chronic problem. The current episode started more than 1 year ago. The problem is uncontrolled. Exacerbating diseases  include diabetes and obesity. Pertinent negatives include no chest pain, myalgias or shortness of breath. Current antihyperlipidemic treatment includes statins. Risk factors for coronary artery disease include diabetes mellitus, dyslipidemia, family history, obesity, male sex and a sedentary lifestyle.       Review of Systems  Constitutional: Negative for chills, fatigue, fever and unexpected weight change.  HENT: Negative for dental problem, mouth sores and trouble swallowing.   Eyes: Positive for blurred vision. Negative for visual disturbance.  Respiratory: Negative for cough, choking, chest tightness, shortness of breath and wheezing.   Cardiovascular: Negative for chest pain, palpitations and leg swelling.  Gastrointestinal: Negative for abdominal distention, abdominal pain, constipation, diarrhea, nausea and vomiting.  Endocrine: Positive for polydipsia and polyuria. Negative for polyphagia.  Genitourinary: Negative for dysuria, flank pain, hematuria and urgency.  Musculoskeletal: Negative for back pain, gait problem, myalgias and neck pain.  Skin: Negative for pallor, rash and wound.  Neurological: Negative for seizures, syncope, weakness, numbness and headaches.  Psychiatric/Behavioral: Negative.  Negative for confusion and dysphoric mood.    Objective:    BP 121/77   Pulse (!) 102   Ht 6' (1.829 m)   Wt 262 lb (118.8 kg)   BMI 35.53 kg/m   Wt Readings from Last 3 Encounters:  12/21/16 262 lb (118.8 kg)  02/12/16 256 lb (116.1 kg)  11/21/12 265 lb (120.2 kg)    Physical Exam  Constitutional: He is oriented to person, place, and time. He appears well-developed. He is cooperative. No distress.  HENT:  Head: Normocephalic and atraumatic.  Eyes: EOM are normal.  Neck: Normal range of motion. Neck supple. No tracheal deviation present. No thyromegaly present.  Cardiovascular: Normal rate, S1 normal, S2 normal and normal heart sounds.  Exam reveals no gallop.   No murmur  heard. Pulses:      Dorsalis pedis pulses are 1+ on the right side, and 1+ on the left side.       Posterior tibial pulses are 1+ on the right side, and 1+ on the left side.  Pulmonary/Chest: Breath sounds normal. No respiratory distress. He has no wheezes.  Abdominal: Soft. Bowel sounds are normal. He exhibits no distension. There is no tenderness. There is no guarding and no CVA tenderness.  Musculoskeletal: He exhibits no edema.       Right shoulder: He exhibits no swelling and no deformity.  Neurological: He is alert and oriented to person, place, and time. He has normal strength and normal reflexes. No cranial nerve deficit or sensory deficit. Gait normal.  Skin: Skin is warm and dry. No rash noted. No cyanosis. Nails show no clubbing.  Psychiatric: His speech is normal. Cognition and memory are normal.  Reluctant affect.    CMP     Component Value Date/Time   NA 138 03/12/2015 0900   K 4.4 03/12/2015 0900   CL 101 03/12/2015 0900   CO2 29 03/12/2015 0900   GLUCOSE 123 (H) 03/12/2015 0900   BUN 9 03/12/2015 0900  CREATININE 0.86 03/12/2015 0900   CALCIUM 8.9 03/12/2015 0900   PROT 7.4 11/21/2012 1234   ALBUMIN 3.8 11/21/2012 1234   AST 21 11/21/2012 1234   ALT 20 11/21/2012 1234   ALKPHOS 82 11/21/2012 1234   BILITOT 0.2 (L) 11/21/2012 1234   GFRNONAA >60 03/12/2015 0900   GFRAA >60 03/12/2015 0900     Diabetic Labs (most recent): Lab Results  Component Value Date   HGBA1C 13.2 11/03/2016      Assessment & Plan:   1. Type 2 diabetes mellitus with stage 3 chronic kidney disease, without long-term current use of insulin ([place])  - Patient has currently uncontrolled symptomatic type 2 DM since  66 years of age,  with most recent A1c of 13 point %. Recent labs reviewed, showing stage 3-4 CKD.   His diabetes is complicated by stage 3-4 renal insufficiency, obesity/sedentary life, chronic heavy smoking (reaching 150 pack years) and patient remains at a high risk for  more acute and chronic complications of diabetes which include CAD, CVA, CKD, retinopathy, and neuropathy. These are all discussed in detail with the patient.  - I have counseled the patient on diet management and weight loss, by adopting a carbohydrate restricted/protein rich diet.  - Suggestion is made for patient to avoid simple carbohydrates   from their diet including Cakes , Desserts, Ice Cream,  Soda (  diet and regular) , Sweet Tea , Candies,  Chips, Cookies, Artificial Sweeteners,   and "Sugar-free" Products . This will help patient to have stable blood glucose profile and potentially avoid unintended weight gain.  - I encouraged the patient to switch to  unprocessed or minimally processed complex starch and increased protein intake (animal or plant source), fruits, and vegetables.  - Patient is advised to stick to a routine mealtimes to eat 3 meals  a day and avoid unnecessary snacks ( to snack only to correct hypoglycemia).  - The patient will be scheduled with Gregory Alvarado, RDN, CDE for individualized DM education.  - I have approached patient with the following individualized plan to manage diabetes and patient agrees:   -  Based on his current glycemic burden with A1c of 13.2%, he would immediately need at least basal insulin treatment. However, unfortunately she declined this offer.  - He agrees to initiate glucose monitoring 4 times a day-before meals and at bedtime and return in 1 week for reevaluation.  -Patient is encouraged to call clinic for blood glucose levels less than 70 or above 300 mg /dl. - In the meantime, I will readjust his therapy as follows : I  will lower his glimepiride to 4 mg by mouth daily with breakfast, start Januvia 25 minute grams by mouth daily with breakfast. -Patient is not a candidate for metformin,SGLT2 inhibitors due to CKD.  - Patient will be considered for incretin therapy as appropriate next visit. - Patient specific target  A1c;  LDL, HDL,  Triglycerides, and  Waist Circumference were discussed in detail.  2) BP/HTN: Controlled. Continue current medications, he is not on ACEI/ARB. 3) Lipids/HPL:    controlled unknown,   Patient is advised tocontinue statins. 4)  Weight/Diet: CDE Consult will be initiated , exercise, and detailed carbohydrates information provided.  5) Chronic Care/Health Maintenance:  -Patient is on statin  medications and encouraged to continue to follow up with Ophthalmology, Podiatrist at least yearly or according to recommendations, and advised to  Quit quit quit smoking he has 150 pack years of smoking). I have recommended yearly  flu vaccine and pneumonia vaccination at least every 5 years; moderate intensity exercise for up to 150 minutes weekly; and  sleep for at least 7 hours a day.  - 60 minutes of time was spent on the care of this patient , 50% of which was applied for counseling on diabetes complications and their preventions.  - Patient to bring meter and  blood glucose logs during his next visit.   - I advised patient to maintain close follow up with Gregory Herring, MD for primary care needs.  Follow up plan: - Return in about 10 days (around 12/31/2016) for follow up with meter and logs- no labs.  Glade Lloyd, MD Phone: [phone]  Fax: [phone]   12/21/2016, 4:20 PM

## 2016-12-21 NOTE — Patient Instructions (Signed)

## 2017-01-05 ENCOUNTER — Encounter: Payer: Self-pay | Admitting: "Endocrinology

## 2017-01-05 ENCOUNTER — Other Ambulatory Visit: Payer: Self-pay

## 2017-01-05 ENCOUNTER — Ambulatory Visit (INDEPENDENT_AMBULATORY_CARE_PROVIDER_SITE_OTHER): Payer: Medicare Other | Admitting: "Endocrinology

## 2017-01-05 VITALS — BP 146/79 | HR 106 | Ht 72.0 in | Wt 265.0 lb

## 2017-01-05 DIAGNOSIS — I10 Essential (primary) hypertension: Secondary | ICD-10-CM | POA: Diagnosis not present

## 2017-01-05 DIAGNOSIS — N183 Chronic kidney disease, stage 3 (moderate): Secondary | ICD-10-CM

## 2017-01-05 DIAGNOSIS — Z720 Tobacco use: Secondary | ICD-10-CM | POA: Insufficient documentation

## 2017-01-05 DIAGNOSIS — Z6835 Body mass index (BMI) 35.0-35.9, adult: Secondary | ICD-10-CM | POA: Diagnosis not present

## 2017-01-05 DIAGNOSIS — F17200 Nicotine dependence, unspecified, uncomplicated: Secondary | ICD-10-CM | POA: Insufficient documentation

## 2017-01-05 DIAGNOSIS — E6609 Other obesity due to excess calories: Secondary | ICD-10-CM | POA: Insufficient documentation

## 2017-01-05 DIAGNOSIS — E782 Mixed hyperlipidemia: Secondary | ICD-10-CM | POA: Diagnosis not present

## 2017-01-05 DIAGNOSIS — IMO0001 Reserved for inherently not codable concepts without codable children: Secondary | ICD-10-CM | POA: Insufficient documentation

## 2017-01-05 DIAGNOSIS — E1122 Type 2 diabetes mellitus with diabetic chronic kidney disease: Secondary | ICD-10-CM | POA: Diagnosis not present

## 2017-01-05 DIAGNOSIS — E66812 Obesity, class 2: Secondary | ICD-10-CM | POA: Insufficient documentation

## 2017-01-05 MED ORDER — LISINOPRIL 10 MG PO TABS: 10.0000 mg | ORAL_TABLET | Freq: Every day | ORAL | 2 refills | Status: DC

## 2017-01-05 MED ORDER — INSULIN PEN NEEDLE 31G X 8 MM MISC: 1.0000 | 3 refills | Status: DC

## 2017-01-05 MED ORDER — ONETOUCH ULTRA BLUE VI STRP: 1.0000 | ORAL_STRIP | Freq: Every day | 2 refills | Status: DC

## 2017-01-05 MED ORDER — BASAGLAR KWIKPEN 100 UNIT/ML ~~LOC~~ SOPN: 20.0000 [IU] | PEN_INJECTOR | Freq: Every day | SUBCUTANEOUS | 2 refills | Status: DC

## 2017-01-05 NOTE — Progress Notes (Signed)
Subjective:    Patient ID: Gregory Alvarado, male    DOB: [birth year]. Patient is being seen in consultation for management of diabetes requested by  Glo Herring, MD  Past Medical History:  Diagnosis Date  . Arthritis    left shoulder  . COPD (chronic obstructive pulmonary disease) ([place])   . Diabetes mellitus without complication ([place])   . Dyslipidemia   . Hypertension   . Insomnia    Past Surgical History:  Procedure Laterality Date  . COLON RESECTION     DIVERTICULITIS  . HERNIA REPAIR     UHR  . ROTATOR CUFF REPAIR Bilateral   . TYMPANOPLASTY     Social History   Social History  . Marital status: Divorced    Spouse name: N/A  . Number of children: N/A  . Years of education: N/A   Social History Main Topics  . Smoking status: Current Every Day Smoker    Packs/day: 3.00    Types: Cigarettes  . Smokeless tobacco: Never Used  . Alcohol use No     Comment: rare  . Drug use: No  . Sexual activity: Not Asked   Other Topics Concern  . None   Social History Narrative  . None   Outpatient Encounter Prescriptions as of 01/05/2017  Medication Sig  . albuterol (PROVENTIL HFA;VENTOLIN HFA) 108 (90 Base) MCG/ACT inhaler Inhale into the lungs every 6 (six) hours as needed for wheezing or shortness of breath.  Marland Kitchen aspirin EC 81 MG tablet Take 81 mg by mouth daily.  . fenofibrate (TRICOR) 145 MG tablet Take 145 mg by mouth daily.  Marland Kitchen glimepiride (AMARYL) 4 MG tablet Take 4 mg by mouth daily with breakfast.  . HYDROcodone-acetaminophen (NORCO/VICODIN) 5-325 MG tablet Take 1 tablet by mouth every 6 (six) hours as needed for moderate pain.  . Insulin Glargine (BASAGLAR KWIKPEN) 100 UNIT/ML SOPN Inject 0.2 mLs (20 Units total) into the skin at bedtime.  . Insulin Pen Needle (B-D ULTRAFINE III SHORT PEN) 31G X 8 MM MISC 1 each by Does not apply route as directed.  Marland Kitchen lisinopril (PRINIVIL,ZESTRIL) 10 MG tablet Take 1 tablet (10 mg total) by mouth daily.  . simvastatin (ZOCOR) 40  MG tablet Take 40 mg by mouth daily.  . vitamin B-12 (CYANOCOBALAMIN) 250 MCG tablet Take 250 mcg by mouth daily.  . vitamin E 100 UNIT capsule Take by mouth daily. Reported on 02/12/2016  . zolpidem (AMBIEN) 10 MG tablet Take 5-10 mg by mouth at bedtime as needed for sleep.  . [DISCONTINUED] ONE TOUCH ULTRA TEST test strip 1 each by Other route daily. And lancets 4/day  . [DISCONTINUED] ONE TOUCH ULTRA TEST test strip 1 each by Other route daily. And lancets 4/day  . [DISCONTINUED] sitaGLIPtin (JANUVIA) 25 MG tablet Take 1 tablet (25 mg total) by mouth daily. (Patient not taking: Reported on 01/05/2017)   No facility-administered encounter medications on file as of 01/05/2017.    ALLERGIES: Allergies  Allergen Reactions  . Caduet [Amlodipine-Atorvastatin]     swelling   VACCINATION STATUS:  There is no immunization history on file for this patient.  Diabetes  He presents for his follow-up diabetic visit. He has type 2 diabetes mellitus. Onset time: He was diagnosed at approximate age of 66 years. His disease course has been worsening. There are no hypoglycemic associated symptoms. Pertinent negatives for hypoglycemia include no confusion, headaches, pallor or seizures. Associated symptoms include blurred vision, foot paresthesias, polydipsia and polyuria. Pertinent negatives  for diabetes include no chest pain, no fatigue, no polyphagia and no weakness. There are no hypoglycemic complications. Symptoms are worsening. Diabetic complications include nephropathy. Risk factors for coronary artery disease include diabetes mellitus, dyslipidemia, hypertension, male sex, sedentary lifestyle, tobacco exposure, obesity and family history. Current diabetic treatment includes oral agent (monotherapy) (He is taking glimepiride 4 mg by mouth twice a day.). His weight is increasing steadily. He is following a generally unhealthy diet. When asked about meal planning, he reported none. He has not had a previous  visit with a dietitian. He never participates in exercise. His breakfast blood glucose range is generally >200 mg/dl. His lunch blood glucose range is generally >200 mg/dl. His dinner blood glucose range is generally >200 mg/dl. His overall blood glucose range is >200 mg/dl. An ACE inhibitor/angiotensin II receptor blocker is not being taken.  Hyperlipidemia  This is a chronic problem. The current episode started more than 1 year ago. The problem is uncontrolled. Exacerbating diseases include diabetes and obesity. Pertinent negatives include no chest pain, myalgias or shortness of breath. Current antihyperlipidemic treatment includes statins. Risk factors for coronary artery disease include diabetes mellitus, dyslipidemia, family history, obesity, male sex and a sedentary lifestyle.       Review of Systems  Constitutional: Negative for chills, fatigue, fever and unexpected weight change.  HENT: Negative for dental problem, mouth sores and trouble swallowing.   Eyes: Positive for blurred vision. Negative for visual disturbance.  Respiratory: Negative for cough, choking, chest tightness, shortness of breath and wheezing.   Cardiovascular: Negative for chest pain, palpitations and leg swelling.  Gastrointestinal: Negative for abdominal distention, abdominal pain, constipation, diarrhea, nausea and vomiting.  Endocrine: Positive for polydipsia and polyuria. Negative for polyphagia.  Genitourinary: Negative for dysuria, flank pain, hematuria and urgency.  Musculoskeletal: Negative for back pain, gait problem, myalgias and neck pain.  Skin: Negative for pallor, rash and wound.  Neurological: Negative for seizures, syncope, weakness, numbness and headaches.  Psychiatric/Behavioral: Negative.  Negative for confusion and dysphoric mood.    Objective:    BP (!) 146/79   Pulse (!) 106   Ht 6' (1.829 m)   Wt 265 lb (120.2 kg)   BMI 35.94 kg/m   Wt Readings from Last 3 Encounters:  01/05/17 265 lb  (120.2 kg)  12/21/16 262 lb (118.8 kg)  02/12/16 256 lb (116.1 kg)    Physical Exam  Constitutional: He is oriented to person, place, and time. He appears well-developed. He is cooperative. No distress.  HENT:  Head: Normocephalic and atraumatic.  Eyes: EOM are normal.  Neck: Normal range of motion. Neck supple. No tracheal deviation present. No thyromegaly present.  Cardiovascular: Normal rate, S1 normal, S2 normal and normal heart sounds.  Exam reveals no gallop.   No murmur heard. Pulses:      Dorsalis pedis pulses are 1+ on the right side, and 1+ on the left side.       Posterior tibial pulses are 1+ on the right side, and 1+ on the left side.  Pulmonary/Chest: Breath sounds normal. No respiratory distress. He has no wheezes.  Abdominal: Soft. Bowel sounds are normal. He exhibits no distension. There is no tenderness. There is no guarding and no CVA tenderness.  Musculoskeletal: He exhibits no edema.       Right shoulder: He exhibits no swelling and no deformity.  Neurological: He is alert and oriented to person, place, and time. He has normal strength and normal reflexes. No cranial nerve deficit or  sensory deficit. Gait normal.  Skin: Skin is warm and dry. No rash noted. No cyanosis. Nails show no clubbing.  Psychiatric: His speech is normal. Cognition and memory are normal.  Reluctant affect.    CMP     Component Value Date/Time   NA 138 03/12/2015 0900   K 4.4 03/12/2015 0900   CL 101 03/12/2015 0900   CO2 29 03/12/2015 0900   GLUCOSE 123 (H) 03/12/2015 0900   BUN 9 03/12/2015 0900   CREATININE 0.86 03/12/2015 0900   CALCIUM 8.9 03/12/2015 0900   PROT 7.4 11/21/2012 1234   ALBUMIN 3.8 11/21/2012 1234   AST 21 11/21/2012 1234   ALT 20 11/21/2012 1234   ALKPHOS 82 11/21/2012 1234   BILITOT 0.2 (L) 11/21/2012 1234   GFRNONAA >60 03/12/2015 0900   GFRAA >60 03/12/2015 0900    Diabetic Labs (most recent): Lab Results  Component Value Date   HGBA1C 13.2 11/03/2016      Assessment & Plan:   1. Type 2 diabetes mellitus with stage 3 chronic kidney disease, without long-term current use of insulin ([place])  - Patient has currently uncontrolled symptomatic type 2 DM since  66 years of age,  with most recent A1c of 13.2%. Recent labs reviewed, showing stage 3-4 CKD.   His diabetes is complicated by stage 3-4 renal insufficiency, obesity/sedentary life, chronic heavy smoking (reaching 150 pack years) and patient remains at a high risk for more acute and chronic complications of diabetes which include CAD, CVA, CKD, retinopathy, and neuropathy. These are all discussed in detail with the patient.  - I have counseled the patient on diet management and weight loss, by adopting a carbohydrate restricted/protein rich diet.  - Suggestion is made for patient to avoid simple carbohydrates   from their diet including Cakes , Desserts, Ice Cream,  Soda (  diet and regular) , Sweet Tea , Candies,  Chips, Cookies, Artificial Sweeteners,   and "Sugar-free" Products . This will help patient to have stable blood glucose profile and potentially avoid unintended weight gain.  - I encouraged the patient to switch to  unprocessed or minimally processed complex starch and increased protein intake (animal or plant source), fruits, and vegetables.  - Patient is advised to stick to a routine mealtimes to eat 3 meals  a day and avoid unnecessary snacks ( to snack only to correct hypoglycemia).  - The patient will be scheduled with Jearld Fenton, RDN, CDE for individualized DM education.  - I have approached patient with the following individualized plan to manage diabetes and patient agrees:   - He came with significant glycemic burden E AG of 279 over the last 7 days. -   Based on his current glycemic burden with A1c of 13.2%, he is reapproached for insulin treatment. -  After some hesitance, he accepted offer of basal insulin.  - I would initiate 20 units of Basaglar subcutaneously  daily at bedtime associated with monitoring of blood glucose twice daily-before breakfast and at bedtime.  -Patient is encouraged to call clinic for blood glucose levels less than 70 or above 300 mg /dl.  I  will intend to  his glimepiride  4 mg by mouth daily with breakfast- this will likely be stopped after next visit.  - He could not afford Januvia.  -Patient is not a candidate for metformin,SGLT2 inhibitors due to CKD.  - Patient will be considered for incretin therapy as appropriate next visit. - Patient specific target  A1c;  LDL, HDL,  Triglycerides, and  Waist Circumference were discussed in detail.  2) BP/HTN: uncontrolled. Continue current medications, he will be initiated on low-dose lisinopril.  3) Lipids/HPL:    controlled unknown,   Patient is advised tocontinue statins. 4)  Weight/Diet: CDE Consult has been  initiated , exercise, and detailed carbohydrates information provided.  5) Chronic Care/Health Maintenance:  -Patient is on statin  medications and encouraged to continue to follow up with Ophthalmology, Podiatrist at least yearly or according to recommendations, and advised to  Quit quit quit smoking he has 150 pack years of smoking). I have recommended yearly flu vaccine and pneumonia vaccination at least every 5 years; moderate intensity exercise for up to 150 minutes weekly; and  sleep for at least 7 hours a day.  - 60 minutes of time was spent on the care of this patient , 50% of which was applied for counseling on diabetes complications and their preventions.  - Patient to bring meter and  blood glucose logs during his next visit.   - I advised patient to maintain close follow up with Glo Herring, MD for primary care needs.  Follow up plan: - Return in about 6 weeks (around 02/16/2017) for follow up with pre-visit labs, meter, and logs.  Glade Lloyd, MD Phone: [phone]  Fax: [phone]   01/05/2017, 10:24 AM

## 2017-01-05 NOTE — Patient Instructions (Signed)

## 2017-01-07 ENCOUNTER — Other Ambulatory Visit: Payer: Self-pay

## 2017-01-07 MED ORDER — INSULIN DETEMIR 100 UNIT/ML FLEXPEN: 20.0000 [IU] | PEN_INJECTOR | Freq: Every day | SUBCUTANEOUS | 2 refills | Status: DC

## 2017-01-11 ENCOUNTER — Other Ambulatory Visit: Payer: Self-pay

## 2017-01-11 MED ORDER — INSULIN DETEMIR 100 UNIT/ML FLEXPEN: 20.0000 [IU] | PEN_INJECTOR | Freq: Every day | SUBCUTANEOUS | 0 refills | Status: DC

## 2017-02-09 DIAGNOSIS — H9202 Otalgia, left ear: Secondary | ICD-10-CM | POA: Diagnosis not present

## 2017-02-09 DIAGNOSIS — Z1389 Encounter for screening for other disorder: Secondary | ICD-10-CM | POA: Diagnosis not present

## 2017-02-09 DIAGNOSIS — E782 Mixed hyperlipidemia: Secondary | ICD-10-CM | POA: Diagnosis not present

## 2017-02-09 DIAGNOSIS — E1129 Type 2 diabetes mellitus with other diabetic kidney complication: Secondary | ICD-10-CM | POA: Diagnosis not present

## 2017-02-10 DIAGNOSIS — E1129 Type 2 diabetes mellitus with other diabetic kidney complication: Secondary | ICD-10-CM | POA: Diagnosis not present

## 2017-02-10 DIAGNOSIS — Z1389 Encounter for screening for other disorder: Secondary | ICD-10-CM | POA: Diagnosis not present

## 2017-02-10 DIAGNOSIS — E781 Pure hyperglyceridemia: Secondary | ICD-10-CM | POA: Diagnosis not present

## 2017-02-16 ENCOUNTER — Ambulatory Visit: Payer: Medicare Other | Admitting: "Endocrinology

## 2017-02-18 ENCOUNTER — Other Ambulatory Visit: Payer: Self-pay | Admitting: "Endocrinology

## 2017-02-18 DIAGNOSIS — N183 Chronic kidney disease, stage 3 (moderate): Secondary | ICD-10-CM | POA: Diagnosis not present

## 2017-02-18 DIAGNOSIS — E1122 Type 2 diabetes mellitus with diabetic chronic kidney disease: Secondary | ICD-10-CM | POA: Diagnosis not present

## 2017-02-18 DIAGNOSIS — E782 Mixed hyperlipidemia: Secondary | ICD-10-CM | POA: Diagnosis not present

## 2017-02-19 LAB — COMPREHENSIVE METABOLIC PANEL
ALK PHOS: 57 IU/L (ref 39–117)
ALT: 15 IU/L (ref 0–44)
AST: 16 IU/L (ref 0–40)
Albumin/Globulin Ratio: 1.6 (ref 1.2–2.2)
Albumin: 4.3 g/dL (ref 3.6–4.8)
BUN/Creatinine Ratio: 15 (ref 10–24)
BUN: 27 mg/dL (ref 8–27)
CHLORIDE: 97 mmol/L (ref 96–106)
CO2: 25 mmol/L (ref 18–29)
CREATININE: 1.76 mg/dL — AB (ref 0.76–1.27)
Calcium: 9.7 mg/dL (ref 8.6–10.2)
GFR calc non Af Amer: 40 mL/min/{1.73_m2} — ABNORMAL LOW (ref >59)
GFR, EST AFRICAN AMERICAN: 47 mL/min/{1.73_m2} — AB (ref >59)
GLUCOSE: 184 mg/dL — AB (ref 65–99)
Globulin, Total: 2.7 g/dL (ref 1.5–4.5)
Potassium: 4.3 mmol/L (ref 3.5–5.2)
Sodium: 137 mmol/L (ref 134–144)
TOTAL PROTEIN: 7 g/dL (ref 6.0–8.5)

## 2017-02-19 LAB — T4, FREE: Free T4: 1.27 ng/dL (ref 0.82–1.77)

## 2017-02-19 LAB — LIPID PANEL W/O CHOL/HDL RATIO
CHOLESTEROL TOTAL: 186 mg/dL (ref 100–199)
HDL: 28 mg/dL — AB (ref >39)
LDL Calculated: 92 mg/dL (ref 0–99)
Triglycerides: 329 mg/dL — ABNORMAL HIGH (ref 0–149)
VLDL CHOLESTEROL CAL: 66 mg/dL — AB (ref 5–40)

## 2017-02-19 LAB — MICROALBUMIN / CREATININE URINE RATIO
Creatinine, Urine: 74 mg/dL
Microalb/Creat Ratio: 21.9 mg/g creat (ref 0.0–30.0)
Microalbumin, Urine: 16.2 ug/mL

## 2017-02-19 LAB — AMBIG ABBREV CMP14 DEFAULT

## 2017-02-19 LAB — AMBIG ABBREV LP DEFAULT

## 2017-02-19 LAB — HGB A1C W/O EAG: Hgb A1c MFr Bld: 11.3 % — ABNORMAL HIGH (ref 4.8–5.6)

## 2017-02-19 LAB — TSH: TSH: 0.827 u[IU]/mL (ref 0.450–4.500)

## 2017-02-24 ENCOUNTER — Ambulatory Visit (INDEPENDENT_AMBULATORY_CARE_PROVIDER_SITE_OTHER): Payer: Medicare Other | Admitting: "Endocrinology

## 2017-02-24 ENCOUNTER — Encounter: Payer: Self-pay | Admitting: "Endocrinology

## 2017-02-24 ENCOUNTER — Telehealth: Payer: Self-pay | Admitting: "Endocrinology

## 2017-02-24 VITALS — BP 129/76 | HR 101 | Ht 72.0 in | Wt 266.0 lb

## 2017-02-24 DIAGNOSIS — IMO0001 Reserved for inherently not codable concepts without codable children: Secondary | ICD-10-CM

## 2017-02-24 DIAGNOSIS — E6609 Other obesity due to excess calories: Secondary | ICD-10-CM | POA: Diagnosis not present

## 2017-02-24 DIAGNOSIS — N183 Chronic kidney disease, stage 3 unspecified: Secondary | ICD-10-CM

## 2017-02-24 DIAGNOSIS — Z6835 Body mass index (BMI) 35.0-35.9, adult: Secondary | ICD-10-CM

## 2017-02-24 DIAGNOSIS — I10 Essential (primary) hypertension: Secondary | ICD-10-CM | POA: Diagnosis not present

## 2017-02-24 DIAGNOSIS — E782 Mixed hyperlipidemia: Secondary | ICD-10-CM

## 2017-02-24 DIAGNOSIS — Z9119 Patient's noncompliance with other medical treatment and regimen: Secondary | ICD-10-CM | POA: Diagnosis not present

## 2017-02-24 DIAGNOSIS — Z91199 Patient's noncompliance with other medical treatment and regimen due to unspecified reason: Secondary | ICD-10-CM | POA: Insufficient documentation

## 2017-02-24 DIAGNOSIS — F17200 Nicotine dependence, unspecified, uncomplicated: Secondary | ICD-10-CM | POA: Diagnosis not present

## 2017-02-24 DIAGNOSIS — E1122 Type 2 diabetes mellitus with diabetic chronic kidney disease: Secondary | ICD-10-CM

## 2017-02-24 MED ORDER — "INSULIN SYRINGE 29G X 1/2"" 0.5 ML MISC": 3 refills | Status: DC

## 2017-02-24 MED ORDER — INSULIN NPH ISOPHANE & REGULAR (70-30) 100 UNIT/ML ~~LOC~~ SUSP: 25.0000 [IU] | Freq: Two times a day (BID) | SUBCUTANEOUS | 2 refills | Status: DC

## 2017-02-24 NOTE — Progress Notes (Signed)
Subjective:    Patient ID: Gregory Alvarado, male    DOB: 1951-04-17. Patient is being seen in consultation for management of diabetes requested by  Redmond School, MD  Past Medical History:  Diagnosis Date  . Arthritis    left shoulder  . COPD (chronic obstructive pulmonary disease) ([place])   . Diabetes mellitus without complication ([place])   . Dyslipidemia   . Hypertension   . Insomnia    Past Surgical History:  Procedure Laterality Date  . COLON RESECTION     DIVERTICULITIS  . HERNIA REPAIR     UHR  . ROTATOR CUFF REPAIR Bilateral   . TYMPANOPLASTY     Social History   Social History  . Marital status: Divorced    Spouse name: N/A  . Number of children: N/A  . Years of education: N/A   Social History Main Topics  . Smoking status: Current Every Day Smoker    Packs/day: 3.00    Types: Cigarettes  . Smokeless tobacco: Never Used  . Alcohol use No     Comment: rare  . Drug use: No  . Sexual activity: Not Asked   Other Topics Concern  . None   Social History Narrative  . None   Outpatient Encounter Prescriptions as of 02/24/2017  Medication Sig  . albuterol (PROVENTIL HFA;VENTOLIN HFA) 108 (90 Base) MCG/ACT inhaler Inhale into the lungs every 6 (six) hours as needed for wheezing or shortness of breath.  Marland Kitchen aspirin EC 81 MG tablet Take 81 mg by mouth daily.  . fenofibrate (TRICOR) 145 MG tablet Take 145 mg by mouth daily.  Marland Kitchen glimepiride (AMARYL) 4 MG tablet Take 4 mg by mouth daily with breakfast.  . HYDROcodone-acetaminophen (NORCO/VICODIN) 5-325 MG tablet Take 1 tablet by mouth every 6 (six) hours as needed for moderate pain.  Marland Kitchen insulin NPH-regular Human (NOVOLIN 70/30) (70-30) 100 UNIT/ML injection Inject 25 Units into the skin 2 (two) times daily with a meal.  . Insulin Pen Needle (B-D ULTRAFINE III SHORT PEN) 31G X 8 MM MISC 1 each by Does not apply route as directed.  . INSULIN SYRINGE .5CC/29G 29G X 1/2" 0.5 ML MISC Use to inject insulin 2 times a day  .  lisinopril (PRINIVIL,ZESTRIL) 10 MG tablet Take 1 tablet (10 mg total) by mouth daily.  . ONE TOUCH ULTRA TEST test strip 1 each by Other route daily. And lancets 4/day  . simvastatin (ZOCOR) 40 MG tablet Take 40 mg by mouth daily.  . vitamin B-12 (CYANOCOBALAMIN) 250 MCG tablet Take 250 mcg by mouth daily.  . vitamin E 100 UNIT capsule Take by mouth daily. Reported on 02/12/2016  . zolpidem (AMBIEN) 10 MG tablet Take 5-10 mg by mouth at bedtime as needed for sleep.  . [DISCONTINUED] Insulin Detemir (LEVEMIR FLEXTOUCH) 100 UNIT/ML Pen Inject 20 Units into the skin at bedtime.  . [DISCONTINUED] Insulin Glargine (BASAGLAR KWIKPEN) 100 UNIT/ML SOPN Inject 0.2 mLs (20 Units total) into the skin at bedtime.   No facility-administered encounter medications on file as of 02/24/2017.    ALLERGIES: Allergies  Allergen Reactions  . Caduet [Amlodipine-Atorvastatin]     swelling   VACCINATION STATUS:  There is no immunization history on file for this patient.  Diabetes  He presents for his follow-up diabetic visit. He has type 2 diabetes mellitus. Onset time: He was diagnosed at approximate age of 63 years. His disease course has been improving. There are no hypoglycemic associated symptoms. Pertinent negatives for hypoglycemia  include no confusion, headaches, pallor or seizures. Associated symptoms include blurred vision, foot paresthesias, polydipsia and polyuria. Pertinent negatives for diabetes include no chest pain, no fatigue, no polyphagia and no weakness. There are no hypoglycemic complications. Symptoms are improving. Diabetic complications include nephropathy. Risk factors for coronary artery disease include diabetes mellitus, dyslipidemia, hypertension, male sex, sedentary lifestyle, tobacco exposure, obesity and family history. Current diabetic treatment includes oral agent (monotherapy). He is compliant with treatment none of the time. His weight is increasing steadily. He is following a  generally unhealthy diet. When asked about meal planning, he reported none. He has not had a previous visit with a dietitian. He never participates in exercise. His breakfast blood glucose range is generally >200 mg/dl. His overall blood glucose range is >200 mg/dl. An ACE inhibitor/angiotensin II receptor blocker is not being taken.  Hyperlipidemia  This is a chronic problem. The current episode started more than 1 year ago. The problem is uncontrolled. Exacerbating diseases include diabetes and obesity. Pertinent negatives include no chest pain, myalgias or shortness of breath. Current antihyperlipidemic treatment includes statins. Risk factors for coronary artery disease include diabetes mellitus, dyslipidemia, family history, obesity, male sex and a sedentary lifestyle.       Review of Systems  Constitutional: Negative for chills, fatigue, fever and unexpected weight change.  HENT: Negative for dental problem, mouth sores and trouble swallowing.   Eyes: Positive for blurred vision. Negative for visual disturbance.  Respiratory: Negative for cough, choking, chest tightness, shortness of breath and wheezing.   Cardiovascular: Negative for chest pain, palpitations and leg swelling.  Gastrointestinal: Negative for abdominal distention, abdominal pain, constipation, diarrhea, nausea and vomiting.  Endocrine: Positive for polydipsia and polyuria. Negative for polyphagia.  Genitourinary: Negative for dysuria, flank pain, hematuria and urgency.  Musculoskeletal: Negative for back pain, gait problem, myalgias and neck pain.  Skin: Negative for pallor, rash and wound.  Neurological: Negative for seizures, syncope, weakness, numbness and headaches.  Psychiatric/Behavioral: Negative.  Negative for confusion and dysphoric mood.    Objective:    BP 129/76   Pulse (!) 101   Ht 6' (1.829 m)   Wt 266 lb (120.7 kg)   BMI 36.08 kg/m   Wt Readings from Last 3 Encounters:  02/24/17 266 lb (120.7 kg)   01/05/17 265 lb (120.2 kg)  12/21/16 262 lb (118.8 kg)    Physical Exam  Constitutional: He is oriented to person, place, and time. He appears well-developed. He is cooperative. No distress.  HENT:  Head: Normocephalic and atraumatic.  Eyes: EOM are normal.  Neck: Normal range of motion. Neck supple. No tracheal deviation present. No thyromegaly present.  Cardiovascular: Normal rate, S1 normal, S2 normal and normal heart sounds.  Exam reveals no gallop.   No murmur heard. Pulses:      Dorsalis pedis pulses are 1+ on the right side, and 1+ on the left side.       Posterior tibial pulses are 1+ on the right side, and 1+ on the left side.  Pulmonary/Chest: Breath sounds normal. No respiratory distress. He has no wheezes.  Abdominal: Soft. Bowel sounds are normal. He exhibits no distension. There is no tenderness. There is no guarding and no CVA tenderness.  Musculoskeletal: He exhibits no edema.       Right shoulder: He exhibits no swelling and no deformity.  Neurological: He is alert and oriented to person, place, and time. He has normal strength and normal reflexes. No cranial nerve deficit or sensory deficit.  Gait normal.  Skin: Skin is warm and dry. No rash noted. No cyanosis. Nails show no clubbing.  Psychiatric: His speech is normal. Cognition and memory are normal.  Reluctant affect.    CMP     Component Value Date/Time   NA 137 02/18/2017 0737   K 4.3 02/18/2017 0737   CL 97 02/18/2017 0737   CO2 25 02/18/2017 0737   GLUCOSE 184 (H) 02/18/2017 0737   GLUCOSE 123 (H) 03/12/2015 0900   BUN 27 02/18/2017 0737   CREATININE 1.76 (H) 02/18/2017 0737   CALCIUM 9.7 02/18/2017 0737   PROT 7.0 02/18/2017 0737   ALBUMIN 4.3 02/18/2017 0737   AST 16 02/18/2017 0737   ALT 15 02/18/2017 0737   ALKPHOS 57 02/18/2017 0737   BILITOT <0.2 02/18/2017 0737   GFRNONAA 40 (L) 02/18/2017 0737   GFRAA 47 (L) 02/18/2017 0737    Diabetic Labs (most recent): Lab Results  Component Value  Date   HGBA1C 11.3 (H) 02/18/2017   HGBA1C 13.2 11/03/2016     Assessment & Plan:   1. Type 2 diabetes mellitus with stage 3 chronic kidney disease, without long-term current use of insulin ([place])  - Patient has currently uncontrolled symptomatic type 2 DM since  66 years of age. - He came with slight improvement in his A1c to 11.3% from 13.2%. Recent labs reviewed, showing stage 3-4 CKD.   His diabetes is complicated by stage 3-4 renal insufficiency, obesity/sedentary life, chronic heavy smoking (reaching 150 pack years) and patient remains at a high risk for more acute and chronic complications of diabetes which include CAD, CVA, CKD, retinopathy, and neuropathy. These are all discussed in detail with the patient.  - I have counseled the patient on diet management and weight loss, by adopting a carbohydrate restricted/protein rich diet.  - Suggestion is made for patient to avoid simple carbohydrates   from their diet including Cakes , Desserts, Ice Cream,  Soda (  diet and regular) , Sweet Tea , Candies,  Chips, Cookies, Artificial Sweeteners,   and "Sugar-free" Products . This will help patient to have stable blood glucose profile and potentially avoid unintended weight gain.  - I encouraged the patient to switch to  unprocessed or minimally processed complex starch and increased protein intake (animal or plant source), fruits, and vegetables.  - Patient is advised to stick to a routine mealtimes to eat 3 meals  a day and avoid unnecessary snacks ( to snack only to correct hypoglycemia).  - The patient will be scheduled with Jearld Fenton, RDN, CDE for individualized DM education.  - I have approached patient with the following individualized plan to manage diabetes and patient agrees:   -  He did not feel his prescriptions saying that he can't afford, however, he continued to smoke 3 packs of cigarettes daily.  - With A1c of 11.3%, he still has significant glycemic burden averaging  above 270 mg/dL.   -  After some hesitance, he accepted offer of a cheaper premixed insulin, I will initiate Novolin 70/3025 units at breakfast and 25 units with supper when pre-meal blood glucose is above 90 mg/dL. - He is advised to continue to strictly monitor blood glucose 2 times a day-before breakfast and before supper.   - I would initiate 20 units of Basaglar subcutaneously daily at bedtime associated with monitoring of blood glucose twice daily-before breakfast and at bedtime.  -Patient is encouraged to call clinic for blood glucose levels less than 70 or above 300  mg /dl. - I will continue with glimepiride 4 mg by mouth daily with breakfast for now. - He could not afford Januvia.  -Patient is not a candidate for metformin,SGLT2 inhibitors due to CKD.  - Patient  is not a candidate for  incretin therapy ,  with significant hypertriglyceridemia and chronic heavy smoking.  - Patient specific target  A1c;  LDL, HDL, Triglycerides, and  Waist Circumference were discussed in detail.  2) BP/HTN: controlled. Continue current medications, he will be initiated on low-dose lisinopril.  3) Lipids/HPL:  controlled unknown,   Patient is advised tocontinue statins. 4)  Weight/Diet: CDE Consult has been  initiated , exercise, and detailed carbohydrates information provided.  5) Chronic Care/Health Maintenance:  -Patient is on statin  medications and encouraged to continue to follow up with Ophthalmology, Podiatrist at least yearly or according to recommendations, and advised to  Quit quit quit smoking he has 150 pack years of smoking). I have recommended yearly flu vaccine and pneumonia vaccination at least every 5 years; moderate intensity exercise for up to 150 minutes weekly; and  sleep for at least 7 hours a day.  - 60 minutes of time was spent on the care of this patient , 50% of which was applied for counseling on diabetes complications and their preventions.  - Patient to bring meter and   blood glucose logs during his next visit.   - I advised patient to maintain close follow up with Redmond School, MD for primary care needs.  Follow up plan: - Return in about 3 months (around 05/27/2017) for follow up with pre-visit labs, meter, and logs.  Glade Lloyd, MD Phone: [phone]  Fax: [phone]   02/24/2017, 10:20 AM

## 2017-02-24 NOTE — Patient Instructions (Signed)

## 2017-02-24 NOTE — Telephone Encounter (Signed)
Medication is going to cost over $200 and he cant afford it, please advise?

## 2017-03-01 MED ORDER — INSULIN NPH ISOPHANE & REGULAR (70-30) 100 UNIT/ML ~~LOC~~ SUSP: 25.0000 [IU] | Freq: Two times a day (BID) | SUBCUTANEOUS | 2 refills | Status: DC

## 2017-03-01 NOTE — Telephone Encounter (Signed)
If Novolin 70/30 is $200, which is unusual, he can try Humulin 70/30.

## 2017-03-01 NOTE — Telephone Encounter (Signed)
Left message for pt to call us back. 

## 2017-03-01 NOTE — Telephone Encounter (Signed)
Rx sent to [facility]

## 2017-03-11 ENCOUNTER — Ambulatory Visit (INDEPENDENT_AMBULATORY_CARE_PROVIDER_SITE_OTHER): Payer: Medicare Other | Admitting: Otolaryngology

## 2017-03-11 DIAGNOSIS — H7012 Chronic mastoiditis, left ear: Secondary | ICD-10-CM

## 2017-03-19 ENCOUNTER — Other Ambulatory Visit: Payer: Self-pay | Admitting: "Endocrinology

## 2017-03-23 ENCOUNTER — Other Ambulatory Visit: Payer: Self-pay

## 2017-03-23 MED ORDER — LISINOPRIL 10 MG PO TABS: 10.0000 mg | ORAL_TABLET | Freq: Every day | ORAL | 0 refills | Status: DC

## 2017-04-16 DIAGNOSIS — Z79899 Other long term (current) drug therapy: Secondary | ICD-10-CM | POA: Diagnosis not present

## 2017-04-16 DIAGNOSIS — D509 Iron deficiency anemia, unspecified: Secondary | ICD-10-CM | POA: Diagnosis not present

## 2017-04-16 DIAGNOSIS — I10 Essential (primary) hypertension: Secondary | ICD-10-CM | POA: Diagnosis not present

## 2017-04-16 DIAGNOSIS — R809 Proteinuria, unspecified: Secondary | ICD-10-CM | POA: Diagnosis not present

## 2017-04-16 DIAGNOSIS — N183 Chronic kidney disease, stage 3 (moderate): Secondary | ICD-10-CM | POA: Diagnosis not present

## 2017-04-21 DIAGNOSIS — N183 Chronic kidney disease, stage 3 (moderate): Secondary | ICD-10-CM | POA: Diagnosis not present

## 2017-04-21 DIAGNOSIS — Z720 Tobacco use: Secondary | ICD-10-CM | POA: Diagnosis not present

## 2017-04-21 DIAGNOSIS — N250 Renal osteodystrophy: Secondary | ICD-10-CM | POA: Diagnosis not present

## 2017-04-21 DIAGNOSIS — D649 Anemia, unspecified: Secondary | ICD-10-CM | POA: Diagnosis not present

## 2017-04-21 DIAGNOSIS — I10 Essential (primary) hypertension: Secondary | ICD-10-CM | POA: Diagnosis not present

## 2017-05-04 DIAGNOSIS — Z0001 Encounter for general adult medical examination with abnormal findings: Secondary | ICD-10-CM | POA: Diagnosis not present

## 2017-05-04 DIAGNOSIS — E781 Pure hyperglyceridemia: Secondary | ICD-10-CM | POA: Diagnosis not present

## 2017-05-04 DIAGNOSIS — Z125 Encounter for screening for malignant neoplasm of prostate: Secondary | ICD-10-CM | POA: Diagnosis not present

## 2017-05-04 DIAGNOSIS — E1129 Type 2 diabetes mellitus with other diabetic kidney complication: Secondary | ICD-10-CM | POA: Diagnosis not present

## 2017-05-04 DIAGNOSIS — Z23 Encounter for immunization: Secondary | ICD-10-CM | POA: Diagnosis not present

## 2017-05-11 DIAGNOSIS — G894 Chronic pain syndrome: Secondary | ICD-10-CM | POA: Diagnosis not present

## 2017-05-11 DIAGNOSIS — E119 Type 2 diabetes mellitus without complications: Secondary | ICD-10-CM | POA: Diagnosis not present

## 2017-05-11 DIAGNOSIS — I10 Essential (primary) hypertension: Secondary | ICD-10-CM | POA: Diagnosis not present

## 2017-05-18 ENCOUNTER — Ambulatory Visit: Payer: Medicare Other | Admitting: "Endocrinology

## 2017-05-18 ENCOUNTER — Other Ambulatory Visit: Payer: Self-pay | Admitting: "Endocrinology

## 2017-05-18 DIAGNOSIS — N183 Chronic kidney disease, stage 3 (moderate): Secondary | ICD-10-CM | POA: Diagnosis not present

## 2017-05-18 DIAGNOSIS — E1122 Type 2 diabetes mellitus with diabetic chronic kidney disease: Secondary | ICD-10-CM | POA: Diagnosis not present

## 2017-05-19 ENCOUNTER — Other Ambulatory Visit: Payer: Self-pay | Admitting: "Endocrinology

## 2017-05-19 LAB — RENAL FUNCTION PANEL
Albumin: 4.4 g/dL (ref 3.6–4.8)
BUN / CREAT RATIO: 14 (ref 10–24)
BUN: 22 mg/dL (ref 8–27)
CO2: 28 mmol/L (ref 20–29)
CREATININE: 1.54 mg/dL — AB (ref 0.76–1.27)
Calcium: 9.6 mg/dL (ref 8.6–10.2)
Chloride: 92 mmol/L — ABNORMAL LOW (ref 96–106)
GFR, EST AFRICAN AMERICAN: 54 mL/min/{1.73_m2} — AB (ref >59)
GFR, EST NON AFRICAN AMERICAN: 47 mL/min/{1.73_m2} — AB (ref >59)
Glucose: 319 mg/dL — ABNORMAL HIGH (ref 65–99)
Phosphorus: 3 mg/dL (ref 2.5–4.5)
Potassium: 4.5 mmol/L (ref 3.5–5.2)
SODIUM: 138 mmol/L (ref 134–144)

## 2017-06-09 ENCOUNTER — Other Ambulatory Visit: Payer: Self-pay | Admitting: "Endocrinology

## 2017-06-09 ENCOUNTER — Encounter: Payer: Self-pay | Admitting: "Endocrinology

## 2017-06-09 ENCOUNTER — Ambulatory Visit (INDEPENDENT_AMBULATORY_CARE_PROVIDER_SITE_OTHER): Payer: Medicare Other | Admitting: "Endocrinology

## 2017-06-09 VITALS — BP 156/81 | HR 104 | Ht 72.0 in | Wt 270.0 lb

## 2017-06-09 DIAGNOSIS — E782 Mixed hyperlipidemia: Secondary | ICD-10-CM | POA: Diagnosis not present

## 2017-06-09 DIAGNOSIS — N183 Chronic kidney disease, stage 3 (moderate): Secondary | ICD-10-CM

## 2017-06-09 DIAGNOSIS — Z6835 Body mass index (BMI) 35.0-35.9, adult: Secondary | ICD-10-CM

## 2017-06-09 DIAGNOSIS — Z9119 Patient's noncompliance with other medical treatment and regimen: Secondary | ICD-10-CM

## 2017-06-09 DIAGNOSIS — F17200 Nicotine dependence, unspecified, uncomplicated: Secondary | ICD-10-CM | POA: Diagnosis not present

## 2017-06-09 DIAGNOSIS — E6609 Other obesity due to excess calories: Secondary | ICD-10-CM

## 2017-06-09 DIAGNOSIS — E1122 Type 2 diabetes mellitus with diabetic chronic kidney disease: Secondary | ICD-10-CM

## 2017-06-09 DIAGNOSIS — I10 Essential (primary) hypertension: Secondary | ICD-10-CM

## 2017-06-09 DIAGNOSIS — Z91199 Patient's noncompliance with other medical treatment and regimen due to unspecified reason: Secondary | ICD-10-CM

## 2017-06-09 DIAGNOSIS — IMO0001 Reserved for inherently not codable concepts without codable children: Secondary | ICD-10-CM

## 2017-06-09 MED ORDER — LISINOPRIL 20 MG PO TABS: 20.0000 mg | ORAL_TABLET | Freq: Every day | ORAL | 6 refills | Status: DC

## 2017-06-09 NOTE — Progress Notes (Signed)
Subjective:    Patient ID: Gregory Alvarado, male    DOB: June 29, 1951. Patient is being seen in f/u for management of diabetes requested by  Redmond School, MD  Past Medical History:  Diagnosis Date  . Arthritis    left shoulder  . COPD (chronic obstructive pulmonary disease) ([place])   . Diabetes mellitus without complication ([place])   . Dyslipidemia   . Hypertension   . Insomnia    Past Surgical History:  Procedure Laterality Date  . COLON RESECTION     DIVERTICULITIS  . HERNIA REPAIR     UHR  . ROTATOR CUFF REPAIR Bilateral   . TYMPANOPLASTY     Social History   Social History  . Marital status: Divorced    Spouse name: N/A  . Number of children: N/A  . Years of education: N/A   Social History Main Topics  . Smoking status: Current Every Day Smoker    Packs/day: 3.00    Types: Cigarettes  . Smokeless tobacco: Never Used  . Alcohol use No     Comment: rare  . Drug use: No  . Sexual activity: Not Asked   Other Topics Concern  . None   Social History Narrative  . None   Outpatient Encounter Prescriptions as of 06/09/2017  Medication Sig  . insulin NPH-regular Human (NOVOLIN 70/30) (70-30) 100 UNIT/ML injection Inject 30 Units into the skin 2 (two) times daily with a meal.  . albuterol (PROVENTIL HFA;VENTOLIN HFA) 108 (90 Base) MCG/ACT inhaler Inhale into the lungs every 6 (six) hours as needed for wheezing or shortness of breath.  Marland Kitchen aspirin EC 81 MG tablet Take 81 mg by mouth daily.  . fenofibrate (TRICOR) 145 MG tablet Take 145 mg by mouth daily.  Marland Kitchen glimepiride (AMARYL) 4 MG tablet Take 4 mg by mouth daily with breakfast.  . HYDROcodone-acetaminophen (NORCO/VICODIN) 5-325 MG tablet Take 1 tablet by mouth every 6 (six) hours as needed for moderate pain.  . Insulin Pen Needle (B-D ULTRAFINE III SHORT PEN) 31G X 8 MM MISC 1 each by Does not apply route as directed.  . INSULIN SYRINGE .5CC/29G 29G X 1/2" 0.5 ML MISC Use to inject insulin 2 times a day  . lisinopril  (PRINIVIL,ZESTRIL) 10 MG tablet TAKE ONE TABLET BY MOUTH DAILY.  . ONE TOUCH ULTRA TEST test strip TEST FOUR TIMES DAILY AS DIRECTED.  Marland Kitchen simvastatin (ZOCOR) 40 MG tablet Take 40 mg by mouth daily.  . vitamin B-12 (CYANOCOBALAMIN) 250 MCG tablet Take 250 mcg by mouth daily.  . vitamin E 100 UNIT capsule Take by mouth daily. Reported on 02/12/2016  . zolpidem (AMBIEN) 10 MG tablet Take 5-10 mg by mouth at bedtime as needed for sleep.  . [DISCONTINUED] insulin NPH-regular Human (NOVOLIN 70/30) (70-30) 100 UNIT/ML injection Inject 25 Units into the skin 2 (two) times daily with a meal.   No facility-administered encounter medications on file as of 06/09/2017.    ALLERGIES: Allergies  Allergen Reactions  . Caduet [Amlodipine-Atorvastatin]     swelling   VACCINATION STATUS:  There is no immunization history on file for this patient.  Diabetes  He presents for his follow-up diabetic visit. He has type 2 diabetes mellitus. Onset time: He was diagnosed at approximate age of 22 years. His disease course has been improving. There are no hypoglycemic associated symptoms. Pertinent negatives for hypoglycemia include no confusion, headaches, pallor or seizures. Associated symptoms include blurred vision, foot paresthesias, polydipsia and polyuria. Pertinent negatives for  diabetes include no chest pain, no fatigue, no polyphagia and no weakness. There are no hypoglycemic complications. Symptoms are improving. Diabetic complications include nephropathy. Risk factors for coronary artery disease include diabetes mellitus, dyslipidemia, hypertension, male sex, sedentary lifestyle, tobacco exposure, obesity and family history. Current diabetic treatment includes oral agent (monotherapy). He is compliant with treatment none of the time. His weight is increasing steadily. He is following a generally unhealthy diet. When asked about meal planning, he reported none. He has not had a previous visit with a dietitian. He  never participates in exercise. His breakfast blood glucose range is generally 180-200 mg/dl. His dinner blood glucose range is generally >200 mg/dl. His overall blood glucose range is >200 mg/dl. An ACE inhibitor/angiotensin II receptor blocker is not being taken.  Hyperlipidemia  This is a chronic problem. The current episode started more than 1 year ago. The problem is uncontrolled. Exacerbating diseases include diabetes and obesity. Pertinent negatives include no chest pain, myalgias or shortness of breath. Current antihyperlipidemic treatment includes statins. Risk factors for coronary artery disease include diabetes mellitus, dyslipidemia, family history, obesity, male sex and a sedentary lifestyle.       Review of Systems  Constitutional: Negative for chills, fatigue, fever and unexpected weight change.  HENT: Negative for dental problem, mouth sores and trouble swallowing.   Eyes: Positive for blurred vision. Negative for visual disturbance.  Respiratory: Negative for cough, choking, chest tightness, shortness of breath and wheezing.   Cardiovascular: Negative for chest pain, palpitations and leg swelling.  Gastrointestinal: Negative for abdominal distention, abdominal pain, constipation, diarrhea, nausea and vomiting.  Endocrine: Positive for polydipsia and polyuria. Negative for polyphagia.  Genitourinary: Negative for dysuria, flank pain, hematuria and urgency.  Musculoskeletal: Negative for back pain, gait problem, myalgias and neck pain.  Skin: Negative for pallor, rash and wound.  Neurological: Negative for seizures, syncope, weakness, numbness and headaches.  Psychiatric/Behavioral: Negative.  Negative for confusion and dysphoric mood.    Objective:    BP (!) 156/81   Pulse (!) 104   Ht 6' (1.829 m)   Wt 270 lb (122.5 kg)   BMI 36.62 kg/m   Wt Readings from Last 3 Encounters:  06/09/17 270 lb (122.5 kg)  02/24/17 266 lb (120.7 kg)  01/05/17 265 lb (120.2 kg)     Physical Exam  Constitutional: He is oriented to person, place, and time. He appears well-developed. He is cooperative. No distress.  HENT:  Head: Normocephalic and atraumatic.  Eyes: EOM are normal.  Neck: Normal range of motion. Neck supple. No tracheal deviation present. No thyromegaly present.  Cardiovascular: Normal rate, S1 normal, S2 normal and normal heart sounds.  Exam reveals no gallop.   No murmur heard. Pulses:      Dorsalis pedis pulses are 1+ on the right side, and 1+ on the left side.       Posterior tibial pulses are 1+ on the right side, and 1+ on the left side.  Pulmonary/Chest: Breath sounds normal. No respiratory distress. He has no wheezes.  Abdominal: Soft. Bowel sounds are normal. He exhibits no distension. There is no tenderness. There is no guarding and no CVA tenderness.  Musculoskeletal: He exhibits no edema.       Right shoulder: He exhibits no swelling and no deformity.  Neurological: He is alert and oriented to person, place, and time. He has normal strength and normal reflexes. No cranial nerve deficit or sensory deficit. Gait normal.  Skin: Skin is warm and dry. No  rash noted. No cyanosis. Nails show no clubbing.  Psychiatric: His speech is normal. Cognition and memory are normal.  Reluctant affect.    CMP     Component Value Date/Time   NA 138 05/18/2017 1002   K 4.5 05/18/2017 1002   CL 92 (L) 05/18/2017 1002   CO2 28 05/18/2017 1002   GLUCOSE 319 (H) 05/18/2017 1002   GLUCOSE 123 (H) 03/12/2015 0900   BUN 22 05/18/2017 1002   CREATININE 1.54 (H) 05/18/2017 1002   CALCIUM 9.6 05/18/2017 1002   PROT 7.0 02/18/2017 0737   ALBUMIN 4.4 05/18/2017 1002   AST 16 02/18/2017 0737   ALT 15 02/18/2017 0737   ALKPHOS 57 02/18/2017 0737   BILITOT <0.2 02/18/2017 0737   GFRNONAA 47 (L) 05/18/2017 1002   GFRAA 54 (L) 05/18/2017 1002    Diabetic Labs (most recent): Lab Results  Component Value Date   HGBA1C 11.3 (H) 02/18/2017   HGBA1C 13.2  11/03/2016     Assessment & Plan:   1. Type 2 diabetes mellitus with stage 3 chronic kidney disease, without long-term current use of insulin ([place])  - Patient has currently uncontrolled symptomatic type 2 DM since  66 years of age. - He came with slight improvement in his Blood glucose profile, his labs did not include A1c. His last A1c was 11.3%. - He will be sent to lab today for A1c and thyroid function test.  His diabetes is complicated by stage 3-4 renal insufficiency, obesity/sedentary life, chronic heavy smoking (reaching 150 pack years) and patient remains at a high risk for more acute and chronic complications of diabetes which include CAD, CVA, CKD, retinopathy, and neuropathy. These are all discussed in detail with the patient.  - I have counseled the patient on diet management and weight loss, by adopting a carbohydrate restricted/protein rich diet.  - Suggestion is made for him to avoid simple carbohydrates  from his diet including Cakes, Sweet Desserts, Ice Cream, Soda (diet and regular), Sweet Tea, Candies, Chips, Cookies, Store Bought Juices, Alcohol in Excess of  1-2 drinks a day, Artificial Sweeteners, and "Sugar-free" Products. This will help patient to have stable blood glucose profile and potentially avoid unintended weight gain.   - I encouraged the patient to switch to  unprocessed or minimally processed complex starch and increased protein intake (animal or plant source), fruits, and vegetables.  - Patient is advised to stick to a routine mealtimes to eat 3 meals  a day and avoid unnecessary snacks ( to snack only to correct hypoglycemia).   - I have approached patient with the following individualized plan to manage diabetes and patient agrees:   - He came with improved blood glucose profile, however, he did not do his A1c. - I will increase Novolin 70/30 to 30 units at breakfast and 30 units with supper when pre-meal blood glucose is above 90 mg/dL. - He is advised  to continue to strictly monitor blood glucose 2 times a day-before breakfast and before supper.  -Patient is encouraged to call clinic for blood glucose levels less than 70 or above 300 mg /dl. - I will continue with glimepiride 4 mg by mouth daily with breakfast for now. -Patient is not a candidate for metformin,SGLT2 inhibitors due to CKD.  - Patient  is not a candidate for  incretin therapy ,  with significant hypertriglyceridemia and chronic heavy smoking.  - Patient specific target  A1c;  LDL, HDL, Triglycerides, and  Waist Circumference were discussed in detail.  2) BP/HTN: uncontrolled. I would increase his lisinopril to 20 mg by mouth daily.   3) Lipids/HPL:  Uncontrolled with triglycerides 329,   Patient is advised to  continue statins. 4)  Weight/Diet: CDE Consult has been  initiated , exercise, and detailed carbohydrates information provided.  5) Chronic Care/Health Maintenance:  -Patient is on statin  medications and encouraged to continue to follow up with Ophthalmology, Podiatrist at least yearly or according to recommendations, and advised to  Quit quit quit smoking he has 150 pack years of smoking). I have recommended yearly flu vaccine and pneumonia vaccination at least every 5 years; moderate intensity exercise for up to 150 minutes weekly; and  sleep for at least 7 hours a day.  - Time spent with the patient: 25 min, of which >50% was spent in reviewing his sugar logs , discussing his hypo- and hyper-glycemic episodes, reviewing his current and  previous labs and insulin doses and developing a plan to avoid hypo- and hyper-glycemia.   - I advised patient to maintain close follow up with Redmond School, MD for primary care needs.  Follow up plan: - Return in about 3 months (around 09/08/2017) for labs today, follow up with pre-visit labs, meter, and logs.  Glade Lloyd, MD Phone: [phone]  Fax: [phone]   This note was partially dictated with voice recognition  software. Similar sounding words can be transcribed inadequately or may not  be corrected upon review.  06/09/2017, 11:20 AM

## 2017-06-09 NOTE — Patient Instructions (Signed)

## 2017-06-10 ENCOUNTER — Ambulatory Visit (INDEPENDENT_AMBULATORY_CARE_PROVIDER_SITE_OTHER): Payer: Medicare Other | Admitting: Otolaryngology

## 2017-06-10 LAB — HEMOGLOBIN: Hemoglobin: 13.7 g/dL (ref 13.0–17.7)

## 2017-06-10 LAB — T4, FREE: FREE T4: 1.34 ng/dL (ref 0.82–1.77)

## 2017-06-10 LAB — TSH: TSH: 0.323 u[IU]/mL — ABNORMAL LOW (ref 0.450–4.500)

## 2017-07-26 DIAGNOSIS — N183 Chronic kidney disease, stage 3 (moderate): Secondary | ICD-10-CM | POA: Diagnosis not present

## 2017-07-26 DIAGNOSIS — E559 Vitamin D deficiency, unspecified: Secondary | ICD-10-CM | POA: Diagnosis not present

## 2017-07-26 DIAGNOSIS — I10 Essential (primary) hypertension: Secondary | ICD-10-CM | POA: Diagnosis not present

## 2017-07-26 DIAGNOSIS — Z79899 Other long term (current) drug therapy: Secondary | ICD-10-CM | POA: Diagnosis not present

## 2017-07-26 DIAGNOSIS — D509 Iron deficiency anemia, unspecified: Secondary | ICD-10-CM | POA: Diagnosis not present

## 2017-07-28 DIAGNOSIS — R809 Proteinuria, unspecified: Secondary | ICD-10-CM | POA: Diagnosis not present

## 2017-07-28 DIAGNOSIS — E559 Vitamin D deficiency, unspecified: Secondary | ICD-10-CM | POA: Diagnosis not present

## 2017-07-28 DIAGNOSIS — Z720 Tobacco use: Secondary | ICD-10-CM | POA: Diagnosis not present

## 2017-07-28 DIAGNOSIS — N183 Chronic kidney disease, stage 3 (moderate): Secondary | ICD-10-CM | POA: Diagnosis not present

## 2017-09-15 ENCOUNTER — Ambulatory Visit: Payer: Medicare Other | Admitting: "Endocrinology

## 2017-10-21 ENCOUNTER — Other Ambulatory Visit: Payer: Self-pay | Admitting: "Endocrinology

## 2018-01-13 DIAGNOSIS — J449 Chronic obstructive pulmonary disease, unspecified: Secondary | ICD-10-CM | POA: Diagnosis not present

## 2018-01-13 DIAGNOSIS — I10 Essential (primary) hypertension: Secondary | ICD-10-CM | POA: Diagnosis not present

## 2018-01-13 DIAGNOSIS — G894 Chronic pain syndrome: Secondary | ICD-10-CM | POA: Diagnosis not present

## 2018-01-13 DIAGNOSIS — G4700 Insomnia, unspecified: Secondary | ICD-10-CM | POA: Diagnosis not present

## 2018-01-13 DIAGNOSIS — E1165 Type 2 diabetes mellitus with hyperglycemia: Secondary | ICD-10-CM | POA: Diagnosis not present

## 2018-01-19 DIAGNOSIS — G894 Chronic pain syndrome: Secondary | ICD-10-CM | POA: Diagnosis not present

## 2018-01-19 DIAGNOSIS — I10 Essential (primary) hypertension: Secondary | ICD-10-CM | POA: Diagnosis not present

## 2018-01-19 DIAGNOSIS — Z0000 Encounter for general adult medical examination without abnormal findings: Secondary | ICD-10-CM | POA: Diagnosis not present

## 2018-01-19 DIAGNOSIS — J449 Chronic obstructive pulmonary disease, unspecified: Secondary | ICD-10-CM | POA: Diagnosis not present

## 2018-01-19 DIAGNOSIS — E1165 Type 2 diabetes mellitus with hyperglycemia: Secondary | ICD-10-CM | POA: Diagnosis not present

## 2018-01-26 DIAGNOSIS — G5622 Lesion of ulnar nerve, left upper limb: Secondary | ICD-10-CM | POA: Diagnosis not present

## 2018-02-08 ENCOUNTER — Other Ambulatory Visit: Payer: Self-pay | Admitting: "Endocrinology

## 2018-02-08 DIAGNOSIS — E782 Mixed hyperlipidemia: Secondary | ICD-10-CM | POA: Diagnosis not present

## 2018-02-08 DIAGNOSIS — I10 Essential (primary) hypertension: Secondary | ICD-10-CM | POA: Diagnosis not present

## 2018-02-08 DIAGNOSIS — D509 Iron deficiency anemia, unspecified: Secondary | ICD-10-CM | POA: Diagnosis not present

## 2018-02-08 DIAGNOSIS — E1165 Type 2 diabetes mellitus with hyperglycemia: Secondary | ICD-10-CM | POA: Diagnosis not present

## 2018-02-23 DIAGNOSIS — G5622 Lesion of ulnar nerve, left upper limb: Secondary | ICD-10-CM | POA: Diagnosis not present

## 2018-02-28 DIAGNOSIS — L820 Inflamed seborrheic keratosis: Secondary | ICD-10-CM | POA: Diagnosis not present

## 2018-03-16 ENCOUNTER — Other Ambulatory Visit: Payer: Self-pay | Admitting: "Endocrinology

## 2018-05-10 DIAGNOSIS — E119 Type 2 diabetes mellitus without complications: Secondary | ICD-10-CM | POA: Diagnosis not present

## 2018-05-10 DIAGNOSIS — Z0100 Encounter for examination of eyes and vision without abnormal findings: Secondary | ICD-10-CM | POA: Diagnosis not present

## 2018-05-25 DIAGNOSIS — I10 Essential (primary) hypertension: Secondary | ICD-10-CM | POA: Diagnosis not present

## 2018-05-25 DIAGNOSIS — E1165 Type 2 diabetes mellitus with hyperglycemia: Secondary | ICD-10-CM | POA: Diagnosis not present

## 2018-05-25 DIAGNOSIS — E782 Mixed hyperlipidemia: Secondary | ICD-10-CM | POA: Diagnosis not present

## 2018-05-25 DIAGNOSIS — D509 Iron deficiency anemia, unspecified: Secondary | ICD-10-CM | POA: Diagnosis not present

## 2018-05-25 DIAGNOSIS — Z0000 Encounter for general adult medical examination without abnormal findings: Secondary | ICD-10-CM | POA: Diagnosis not present

## 2018-05-26 DIAGNOSIS — E1165 Type 2 diabetes mellitus with hyperglycemia: Secondary | ICD-10-CM | POA: Diagnosis not present

## 2018-05-26 DIAGNOSIS — I10 Essential (primary) hypertension: Secondary | ICD-10-CM | POA: Diagnosis not present

## 2018-05-26 DIAGNOSIS — D509 Iron deficiency anemia, unspecified: Secondary | ICD-10-CM | POA: Diagnosis not present

## 2018-05-26 DIAGNOSIS — E782 Mixed hyperlipidemia: Secondary | ICD-10-CM | POA: Diagnosis not present

## 2018-05-26 DIAGNOSIS — G4700 Insomnia, unspecified: Secondary | ICD-10-CM | POA: Diagnosis not present

## 2018-07-28 DIAGNOSIS — N183 Chronic kidney disease, stage 3 (moderate): Secondary | ICD-10-CM | POA: Diagnosis not present

## 2018-07-28 DIAGNOSIS — R809 Proteinuria, unspecified: Secondary | ICD-10-CM | POA: Diagnosis not present

## 2018-07-28 DIAGNOSIS — I10 Essential (primary) hypertension: Secondary | ICD-10-CM | POA: Diagnosis not present

## 2018-07-28 DIAGNOSIS — D649 Anemia, unspecified: Secondary | ICD-10-CM | POA: Diagnosis not present

## 2018-07-28 DIAGNOSIS — Z79899 Other long term (current) drug therapy: Secondary | ICD-10-CM | POA: Diagnosis not present

## 2018-07-29 DIAGNOSIS — I10 Essential (primary) hypertension: Secondary | ICD-10-CM | POA: Diagnosis not present

## 2018-07-29 DIAGNOSIS — D649 Anemia, unspecified: Secondary | ICD-10-CM | POA: Diagnosis not present

## 2018-07-29 DIAGNOSIS — Z79899 Other long term (current) drug therapy: Secondary | ICD-10-CM | POA: Diagnosis not present

## 2018-07-29 DIAGNOSIS — R809 Proteinuria, unspecified: Secondary | ICD-10-CM | POA: Diagnosis not present

## 2018-07-29 DIAGNOSIS — N183 Chronic kidney disease, stage 3 (moderate): Secondary | ICD-10-CM | POA: Diagnosis not present

## 2018-08-15 DIAGNOSIS — I10 Essential (primary) hypertension: Secondary | ICD-10-CM | POA: Diagnosis not present

## 2018-08-15 DIAGNOSIS — R252 Cramp and spasm: Secondary | ICD-10-CM | POA: Diagnosis not present

## 2018-08-15 DIAGNOSIS — R600 Localized edema: Secondary | ICD-10-CM | POA: Diagnosis not present

## 2018-08-15 NOTE — Progress Notes (Signed)
Received order from Dr. Lowanda Foster for Feraheme infusions.  Patient wants to know how much they will cost out of pocket.  Gave patient [facility] Customer Service number [phone].  Once he has information and agrees with cost, he will call us at [phone] to schedule.

## 2018-08-15 NOTE — Addendum Note (Signed)
Encounter addended by: Holley Dexter, RN on: 08/15/2018 4:48 PM  Actions taken: Sign clinical note

## 2018-08-26 ENCOUNTER — Ambulatory Visit: Payer: Medicare Other | Admitting: Cardiology

## 2018-08-26 ENCOUNTER — Encounter: Payer: Self-pay | Admitting: Cardiology

## 2018-08-26 VITALS — BP 150/80 | HR 92 | Ht 72.0 in | Wt 303.0 lb

## 2018-08-26 DIAGNOSIS — I10 Essential (primary) hypertension: Secondary | ICD-10-CM

## 2018-08-26 DIAGNOSIS — R0602 Shortness of breath: Secondary | ICD-10-CM | POA: Diagnosis not present

## 2018-08-26 DIAGNOSIS — R600 Localized edema: Secondary | ICD-10-CM | POA: Diagnosis not present

## 2018-08-26 MED ORDER — TORSEMIDE 20 MG PO TABS: 20.0000 mg | ORAL_TABLET | Freq: Two times a day (BID) | ORAL | 3 refills | Status: DC

## 2018-08-26 NOTE — Patient Instructions (Signed)
Medication Instructions:  Your physician recommends that you continue on your current medications as directed. Please refer to the Current Medication list given to you today.   Labwork: NONE  Testing/Procedures: Your physician has requested that you have an echocardiogram. Echocardiography is a painless test that uses sound waves to create images of your heart. It provides your doctor with information about the size and shape of your heart and how well your heart's chambers and valves are working. This procedure takes approximately one hour. There are no restrictions for this procedure.    Follow-Up: Your physician recommends that you schedule a follow-up appointment in: 1 MONTH    Any Other Special Instructions Will Be Listed Below (If Applicable).     If you need a refill on your cardiac medications before your next appointment, please call your pharmacy.   

## 2018-08-26 NOTE — Progress Notes (Signed)
Clinical Summary Gregory Alvarado is a 67 y.o.male seen as new consult  1. SOB/Leg swelling - ongoing 6-8 months. Increased SOB/DOE. DOE just walking in from [employer] lot - no orthopnea. - no chest pain.  - lasix caused diffuse cramps.  - torsemide prescribed by pcp 20mg  daily, has not tried. - limiting sodium intake.      Past Medical History:  Diagnosis Date  . Arthritis    left shoulder  . COPD (chronic obstructive pulmonary disease) ([place])   . Diabetes mellitus without complication ([place])   . Dyslipidemia   . Hypertension   . Insomnia      Allergies  Allergen Reactions  . Caduet [Amlodipine-Atorvastatin]     swelling     Current Outpatient Medications  Medication Sig Dispense Refill  . albuterol (PROVENTIL HFA;VENTOLIN HFA) 108 (90 Base) MCG/ACT inhaler Inhale into the lungs every 6 (six) hours as needed for wheezing or shortness of breath.    Marland Kitchen aspirin EC 81 MG tablet Take 81 mg by mouth daily.    . fenofibrate (TRICOR) 145 MG tablet Take 145 mg by mouth daily.    Marland Kitchen glimepiride (AMARYL) 4 MG tablet Take 4 mg by mouth daily with breakfast.    . HYDROcodone-acetaminophen (NORCO/VICODIN) 5-325 MG tablet Take 1 tablet by mouth every 6 (six) hours as needed for moderate pain.    Marland Kitchen insulin NPH-regular Human (NOVOLIN 70/30) (70-30) 100 UNIT/ML injection Inject 30 Units into the skin 2 (two) times daily with a meal.    . Insulin Pen Needle (B-D ULTRAFINE III SHORT PEN) 31G X 8 MM MISC 1 each by Does not apply route as directed. 100 each 3  . INSULIN SYRINGE .5CC/29G 29G X 1/2" 0.5 ML MISC Use to inject insulin 2 times a day 100 each 3  . lisinopril (PRINIVIL,ZESTRIL) 10 MG tablet TAKE ONE TABLET BY MOUTH DAILY. 90 tablet 0  . lisinopril (PRINIVIL,ZESTRIL) 20 MG tablet Take 1 tablet (20 mg total) by mouth daily. 30 tablet 6  . ONE TOUCH ULTRA TEST test strip TEST FOUR TIMES DAILY AS DIRECTED. 150 each 5  . simvastatin (ZOCOR) 40 MG tablet Take 40 mg by mouth daily.    .  vitamin B-12 (CYANOCOBALAMIN) 250 MCG tablet Take 250 mcg by mouth daily.    . vitamin E 100 UNIT capsule Take by mouth daily. Reported on 02/12/2016    . zolpidem (AMBIEN) 10 MG tablet Take 5-10 mg by mouth at bedtime as needed for sleep.     No current facility-administered medications for this visit.      Past Surgical History:  Procedure Laterality Date  . COLON RESECTION     DIVERTICULITIS  . HERNIA REPAIR     UHR  . ROTATOR CUFF REPAIR Bilateral   . TYMPANOPLASTY       Allergies  Allergen Reactions  . Caduet [Amlodipine-Atorvastatin]     swelling      Family History  Problem Relation Age of Onset  . Diabetes Mother      Social History Gregory Alvarado reports that he has been smoking cigarettes. He has been smoking about 3.00 packs per day. He has never used smokeless tobacco. Gregory Alvarado reports no history of alcohol use.   Review of Systems CONSTITUTIONAL: No weight loss, fever, chills, weakness or fatigue.  HEENT: Eyes: No visual loss, blurred vision, double vision or yellow sclerae.No hearing loss, sneezing, congestion, runny nose or sore throat.  SKIN: No rash or itching.  CARDIOVASCULAR: per  hpi RESPIRATORY: per hpi  GASTROINTESTINAL: No anorexia, nausea, vomiting or diarrhea. No abdominal pain or blood.  GENITOURINARY: No burning on urination, no polyuria NEUROLOGICAL: No headache, dizziness, syncope, paralysis, ataxia, numbness or tingling in the extremities. No change in bowel or bladder control.  MUSCULOSKELETAL: No muscle, back pain, joint pain or stiffness.  LYMPHATICS: No enlarged nodes. No history of splenectomy.  PSYCHIATRIC: No history of depression or anxiety.  ENDOCRINOLOGIC: No reports of sweating, cold or heat intolerance. No polyuria or polydipsia.  Marland Kitchen   Physical Examination Vitals:   08/26/18 1050 08/26/18 1053  BP: (!) 146/84 (!) 150/80  Pulse: 92   SpO2: 94%    Vitals:   08/26/18 1050  Weight: (!) 303 lb (137.4 kg)  Height: 6' (1.829 m)     Gen: resting comfortably, no acute distress HEENT: no scleral icterus, pupils equal round and reactive, no palptable cervical adenopathy,  CV: RRR, no m/r/g no jvd Resp: faint crackles bilateral bases GI: abdomen is soft, non-tender, non-distended, normal bowel sounds, no hepatosplenomegaly MSK: extremities are warm, 2+ bilateral LE edema Skin: warm, no rash Neuro:  no focal deficits Psych: appropriate affect    Assessment and Plan  1. LE edema/DOB - significant volume overload by exam - we will obtain echo to evaluate for underlying cardiac dysfunction - cramps on lasix, agree with pcp to try torsemide 20mg  daily. If does not tolerate daily due to cramps would try every other day. If able to take steady diuretic would need to repeat his BMET/Mg prior to f/u  2. HTN - elevated in clinic, follow with diuresis   EKG shows SR, nonspecific ST/T changes  F/u 1 month.   Arnoldo Lenis, M.D.

## 2018-08-29 ENCOUNTER — Ambulatory Visit (HOSPITAL_COMMUNITY)
Admission: RE | Admit: 2018-08-29 | Discharge: 2018-08-29 | Disposition: A | Discharge status: Home / Self Care | Payer: Medicare Other | Source: Ambulatory Visit | Attending: Cardiology | Admitting: Cardiology

## 2018-08-29 DIAGNOSIS — R0602 Shortness of breath: Secondary | ICD-10-CM | POA: Diagnosis not present

## 2018-08-29 NOTE — Progress Notes (Signed)
*  PRELIMINARY RESULTS* Echocardiogram 2D Echocardiogram has been performed.  Samuel Germany 08/29/2018, 9:03 AM

## 2018-08-31 ENCOUNTER — Telehealth: Payer: Self-pay

## 2018-08-31 DIAGNOSIS — R0602 Shortness of breath: Secondary | ICD-10-CM

## 2018-08-31 NOTE — Telephone Encounter (Signed)
Called pt. No answer, left message for  Pt to return call. Placed order, messaged front office to schedule.

## 2018-08-31 NOTE — Telephone Encounter (Signed)
-----   Message from Arnoldo Lenis, MD sent at 08/31/2018  9:53 AM EST ----- Echo suggests heart function may be mildly decreased, but the pictures are somewhat limited and we need to confirm this. Can we obtain a limited echo with contrast to evaluate for LV function  Zandra Abts MD

## 2018-09-08 ENCOUNTER — Ambulatory Visit (HOSPITAL_COMMUNITY)
Admission: RE | Admit: 2018-09-08 | Discharge: 2018-09-08 | Disposition: A | Discharge status: Home / Self Care | Payer: Medicare Other | Source: Ambulatory Visit | Attending: Cardiology | Admitting: Cardiology

## 2018-09-08 DIAGNOSIS — R0602 Shortness of breath: Secondary | ICD-10-CM | POA: Diagnosis not present

## 2018-09-08 MED ORDER — PERFLUTREN LIPID MICROSPHERE
1.0000 mL | INTRAVENOUS | Status: AC | PRN
  Administered 2018-09-08: 1 mL via INTRAVENOUS
  Filled 2018-09-08: qty 10

## 2018-09-08 NOTE — Progress Notes (Signed)
*  PRELIMINARY RESULTS* Echocardiogram 2D Echocardiogram LIMITED WITH DEFINITY has been performed.  Leavy Cella 09/08/2018, 11:21 AM

## 2018-09-26 DIAGNOSIS — E1165 Type 2 diabetes mellitus with hyperglycemia: Secondary | ICD-10-CM | POA: Diagnosis not present

## 2018-09-26 DIAGNOSIS — E782 Mixed hyperlipidemia: Secondary | ICD-10-CM | POA: Diagnosis not present

## 2018-09-26 DIAGNOSIS — D509 Iron deficiency anemia, unspecified: Secondary | ICD-10-CM | POA: Diagnosis not present

## 2018-09-26 DIAGNOSIS — I10 Essential (primary) hypertension: Secondary | ICD-10-CM | POA: Diagnosis not present

## 2018-10-05 ENCOUNTER — Ambulatory Visit: Payer: Medicare Other | Admitting: Cardiology

## 2018-10-05 ENCOUNTER — Encounter

## 2018-10-13 ENCOUNTER — Encounter: Payer: Self-pay | Admitting: Cardiology

## 2018-10-13 ENCOUNTER — Ambulatory Visit (INDEPENDENT_AMBULATORY_CARE_PROVIDER_SITE_OTHER): Payer: Medicare Other | Admitting: Cardiology

## 2018-10-13 VITALS — BP 150/70 | HR 72 | Ht 72.0 in | Wt 306.0 lb

## 2018-10-13 DIAGNOSIS — I5033 Acute on chronic diastolic (congestive) heart failure: Secondary | ICD-10-CM

## 2018-10-13 MED ORDER — CHLORTHALIDONE 25 MG PO TABS: 12.5000 mg | ORAL_TABLET | Freq: Every day | ORAL | 0 refills | Status: DC

## 2018-10-13 NOTE — Patient Instructions (Addendum)
Medication Instructions:  START CHLORTHALIDONE 12.5 MG DAILY ( 1/2 TABLET)   Labwork: NONE  Testing/Procedures: NONE  Follow-Up: Your physician recommends that you schedule a follow-up appointment in: 3 months    Any Other Special Instructions Will Be Listed Below (If Applicable).  PLEASE CALL OFFICE NEXT Thursday WITH WEIGHTS AND UPDATE ON SYMPTOMS.    If you need a refill on your cardiac medications before your next appointment, please call your pharmacy.

## 2018-10-13 NOTE — Progress Notes (Signed)
Clinical Summary Gregory Alvarado is a 68 y.o.male seen today for follow up of the following medical problems.   1. SOB/Leg swelling/Chronic diastolic HF - ongoing 6-8 months. Increased SOB/DOE. DOE just walking in from [employer] lot - cramps with low dose furosemide and torsemide, he has stopped taking   - 08/2018 initial echo poor visualization,suggestion LVEF 40-45%, grade I diastoilc dysfunction - 08/2018 limited contrast echo LVEF 55-60%, no WMAs.    Past Medical History:  Diagnosis Date  . Arthritis    left shoulder  . COPD (chronic obstructive pulmonary disease) ([place])   . Diabetes mellitus without complication ([place])   . Dyslipidemia   . Hypertension   . Insomnia      Allergies  Allergen Reactions  . Caduet [Amlodipine-Atorvastatin]     swelling     Current Outpatient Medications  Medication Sig Dispense Refill  . albuterol (PROVENTIL HFA;VENTOLIN HFA) 108 (90 Base) MCG/ACT inhaler Inhale into the lungs every 6 (six) hours as needed for wheezing or shortness of breath.    Marland Kitchen aspirin EC 81 MG tablet Take 81 mg by mouth daily.    . fenofibrate (TRICOR) 145 MG tablet Take 145 mg by mouth daily.    Marland Kitchen HYDROcodone-acetaminophen (NORCO/VICODIN) 5-325 MG tablet Take 1 tablet by mouth every 6 (six) hours as needed for moderate pain.    Marland Kitchen insulin NPH-regular Human (NOVOLIN 70/30) (70-30) 100 UNIT/ML injection Inject 30 Units into the skin 2 (two) times daily with a meal.    . Insulin Pen Needle (B-D ULTRAFINE III SHORT PEN) 31G X 8 MM MISC 1 each by Does not apply route as directed. 100 each 3  . INSULIN SYRINGE .5CC/29G 29G X 1/2" 0.5 ML MISC Use to inject insulin 2 times a day 100 each 3  . lisinopril (PRINIVIL,ZESTRIL) 20 MG tablet Take 1 tablet (20 mg total) by mouth daily. 30 tablet 6  . ONE TOUCH ULTRA TEST test strip TEST FOUR TIMES DAILY AS DIRECTED. 150 each 5  . simvastatin (ZOCOR) 40 MG tablet Take 40 mg by mouth daily.    Marland Kitchen torsemide (DEMADEX) 20 MG tablet Take 1  tablet (20 mg total) by mouth 2 (two) times daily. 180 tablet 3  . vitamin B-12 (CYANOCOBALAMIN) 250 MCG tablet Take 250 mcg by mouth daily.    Marland Kitchen zolpidem (AMBIEN) 10 MG tablet Take 5-10 mg by mouth at bedtime as needed for sleep.     No current facility-administered medications for this visit.      Past Surgical History:  Procedure Laterality Date  . COLON RESECTION     DIVERTICULITIS  . HERNIA REPAIR     UHR  . ROTATOR CUFF REPAIR Bilateral   . TYMPANOPLASTY       Allergies  Allergen Reactions  . Caduet [Amlodipine-Atorvastatin]     swelling      Family History  Problem Relation Age of Onset  . Diabetes Mother   . Heart attack Mother   . Heart attack Father   . Heart attack Brother      Social History Mr. Mannella reports that he has been smoking cigarettes. He has been smoking about 3.00 packs per day. He has never used smokeless tobacco. Mr. Balboa reports no history of alcohol use.   Review of Systems CONSTITUTIONAL: No weight loss, fever, chills, weakness or fatigue.  HEENT: Eyes: No visual loss, blurred vision, double vision or yellow sclerae.No hearing loss, sneezing, congestion, runny nose or sore throat.  SKIN: No rash  or itching.  CARDIOVASCULAR: no chest pain, no palpitations.  RESPIRATORY: na GASTROINTESTINAL: No anorexia, nausea, vomiting or diarrhea. No abdominal pain or blood.  GENITOURINARY: No burning on urination, no polyuria NEUROLOGICAL: No headache, dizziness, syncope, paralysis, ataxia, numbness or tingling in the extremities. No change in bowel or bladder control.  MUSCULOSKELETAL: No muscle, back pain, joint pain or stiffness.  LYMPHATICS: No enlarged nodes. No history of splenectomy.  PSYCHIATRIC: No history of depression or anxiety.  ENDOCRINOLOGIC: No reports of sweating, cold or heat intolerance. No polyuria or polydipsia.  Marland Kitchen   Physical Examination Vitals:   10/13/18 1031  BP: (!) 150/70  Pulse: 72  SpO2: 94%   Vitals:   10/13/18  1031  Weight: (!) 306 lb (138.8 kg)  Height: 6' (1.829 m)    Gen: resting comfortably, no acute distress HEENT: no scleral icterus, pupils equal round and reactive, no palptable cervical adenopathy,  CV: RRR, no m/r/g, no jvd Resp: Clear to auscultation bilaterally GI: abdomen is soft, non-tender, non-distended, normal bowel sounds, no hepatosplenomegaly MSK: extremities are warm, 2+ bilateral LE edema Skin: warm, no rash Neuro:  no focal deficits Psych: appropriate affect   Diagnostic Studies 08/2018 echo Study Conclusions  - Left ventricle: The cavity size was normal. Wall thickness was   increased increased in a pattern of mild to moderate LVH.   Systolic function was mildly to moderately reduced. The estimated   ejection fraction was in the range of 40% to 45%. Diffuse   hypokinesis. Doppler parameters are consistent with abnormal left   ventricular relaxation (grade 1 diastolic dysfunction). Doppler   parameters are consistent with high ventricular filling pressure. - Aortic valve: Valve area (VTI): 3.22 cm^2. Valve area (Vmax):   2.74 cm^2. Valve area (Vmean): 2.29 cm^2. - Left atrium: The atrium was moderately dilated. - Atrial septum: No defect or patent foramen ovale was identified. - Techncically difficult study, recommend limited study with   echocontrast to better clarify LVEF. Based on available images   appears mildly decreased.  08/2018 contrast echo Study Conclusions  - Left ventricle: Limited study with Definity contrast. Systolic   function was normal. The estimated ejection fraction was in the   range of 55% to 60%. Wall motion was normal; there were no   regional wall motion abnormalities. - Mitral valve: There was trivial regurgitation. - Pericardium, extracardiac: A prominent pericardial fat pad was   present.    Assessment and Plan  1. Acute on chronic diastolic HF - did not tolerate low dose lasix or torsemide due to body cramps - we will  try chlorthalidone 12.5mg  daily. He has asked for just a 7 day prescription to try, will call us in a week to update [place] on his medication tolerance and weights - if tolerates would need bMET/mg in 2 weeks   F/u 3 months   Arnoldo Lenis, M.D.

## 2018-11-14 DIAGNOSIS — Z79899 Other long term (current) drug therapy: Secondary | ICD-10-CM | POA: Diagnosis not present

## 2018-11-14 DIAGNOSIS — R809 Proteinuria, unspecified: Secondary | ICD-10-CM | POA: Diagnosis not present

## 2018-11-14 DIAGNOSIS — D509 Iron deficiency anemia, unspecified: Secondary | ICD-10-CM | POA: Diagnosis not present

## 2018-11-14 DIAGNOSIS — E559 Vitamin D deficiency, unspecified: Secondary | ICD-10-CM | POA: Diagnosis not present

## 2018-11-14 DIAGNOSIS — I10 Essential (primary) hypertension: Secondary | ICD-10-CM | POA: Diagnosis not present

## 2018-11-14 DIAGNOSIS — N183 Chronic kidney disease, stage 3 (moderate): Secondary | ICD-10-CM | POA: Diagnosis not present

## 2018-11-16 DIAGNOSIS — Z720 Tobacco use: Secondary | ICD-10-CM | POA: Diagnosis not present

## 2018-11-16 DIAGNOSIS — E559 Vitamin D deficiency, unspecified: Secondary | ICD-10-CM | POA: Diagnosis not present

## 2018-11-16 DIAGNOSIS — D509 Iron deficiency anemia, unspecified: Secondary | ICD-10-CM | POA: Diagnosis not present

## 2018-11-16 DIAGNOSIS — N182 Chronic kidney disease, stage 2 (mild): Secondary | ICD-10-CM | POA: Diagnosis not present

## 2018-11-16 DIAGNOSIS — R809 Proteinuria, unspecified: Secondary | ICD-10-CM | POA: Diagnosis not present

## 2018-12-08 ENCOUNTER — Encounter: Payer: Self-pay | Admitting: Gastroenterology

## 2018-12-27 ENCOUNTER — Other Ambulatory Visit: Payer: Self-pay

## 2018-12-27 NOTE — Patient Outreach (Signed)
[facility]) Care Management  12/27/2018  Armani Brar Brennan 29-Mar-1951 [id number]   Medication Adherence call to Mr. Bradd Eve HIPPA Compliant Voice message left with a call back number. Mr. Steward is showing past due on Losartan 100 mg under [place].   [place] Management Direct Dial [phone]  Fax [phone] [email or web address]

## 2019-01-20 ENCOUNTER — Telehealth: Payer: Self-pay

## 2019-01-20 DIAGNOSIS — Z0000 Encounter for general adult medical examination without abnormal findings: Secondary | ICD-10-CM | POA: Diagnosis not present

## 2019-01-20 NOTE — Telephone Encounter (Signed)
Pt agrees to Virtual visit. Consent obtained. Knows to have vitals/medications ready prior to appointment.

## 2019-01-20 NOTE — Telephone Encounter (Signed)
Virtual Visit Pre-Appointment Phone Call  "(Name), I am calling you today to discuss your upcoming appointment. We are currently trying to limit exposure to the virus that causes COVID-19 by seeing patients at home rather than in the office."  1. "What is the BEST phone number to call the day of the visit?" - include this in appointment notes  2. "Do you have or have access to (through a family member/friend) a smartphone with video capability that we can use for your visit?" a. If yes - list this number in appt notes as "cell" (if different from BEST phone #) and list the appointment type as a VIDEO visit in appointment notes b. If no - list the appointment type as a PHONE visit in appointment notes  Confirm consent - "In the setting of the current Covid19 crisis, you are scheduled for a (phone or video) visit with your provider on (date) at (time).  Just as we do with many in-office visits, in order for you to participate in this visit, we must obtain consent.  If you'd like, I can send this to your mychart (if signed up) or email for you to review.  Otherwise, I can obtain your verbal consent now.  All virtual visits are billed to your insurance company just like a normal visit would be.  By agreeing to a virtual visit, we'd like you to understand that the technology does not allow for your provider to perform an examination, and thus may limit your provider's ability to fully assess your condition. If your provider identifies any concerns that need to be evaluated in person, we will make arrangements to do so.  Finally, though the technology is pretty good, we cannot assure that it will always work on either your or our end, and in the setting of a video visit, we may have to convert it to a phone-only visit.  In either situation, we cannot ensure that we have a secure connection.  Are you willing to proceed?" YES  3. Advise patient to be prepared - "Two hours prior to your appointment, go ahead  and check your blood pressure, pulse, oxygen saturation, and your weight (if you have the equipment to check those) and write them all down. When your visit starts, your provider will ask you for this information. If you have an Apple Watch or Kardia device, please plan to have heart rate information ready on the day of your appointment. Please have a pen and paper handy nearby the day of the visit as well."  4. Give patient instructions for MyChart download to smartphone OR [email or web address] as below if video visit (depending on what platform provider is using)  5. Inform patient they will receive a phone call 15 minutes prior to their appointment time (may be from unknown caller ID) so they should be prepared to answer    TELEPHONE CALL NOTE  Orlando Penner Arabie has been deemed a candidate for a follow-up tele-health visit to limit community exposure during the Covid-19 pandemic. I spoke with the patient via phone to ensure availability of phone/video source, confirm preferred email & phone number, and discuss instructions and expectations.  I reminded Lillie Bollig Mcmahill to be prepared with any vital sign and/or heart rhythm information that could potentially be obtained via home monitoring, at the time of his visit. I reminded [place] Chelf Chasen to expect a phone call prior to his visit.  Drema Dallas, CMA 01/20/2019 8:32 AM   INSTRUCTIONS FOR  DOWNLOADING THE MYCHART APP TO SMARTPHONE  - The patient must first make sure to have activated MyChart and know their login information - If Apple, go to [employer] and type in MyChart in the search bar and download the app. If Android, ask patient to go to [employer] and type in [place] in the search bar and download the app. The app is free but as with any other app downloads, their phone may require them to verify saved payment information or Apple/Android password.  - The patient will need to then log into the app with their MyChart username and password, and  select [facility] as their healthcare provider to link the account. When it is time for your visit, go to the MyChart app, find appointments, and click Begin Video Visit. Be sure to Select Allow for your device to access the Microphone and Camera for your visit. You will then be connected, and your provider will be with you shortly.  **If they have any issues connecting, or need assistance please contact MyChart service desk [phone])**  **If using a computer, in order to ensure the best quality for their visit they will need to use either of the following Internet Browsers: [employer], or Google Chrome**  IF USING DOXIMITY or DOXY.ME - The patient will receive a link just prior to their visit by text.     FULL LENGTH CONSENT FOR TELE-HEALTH VISIT   I hereby voluntarily request, consent and authorize [facility] and its employed or contracted physicians, physician assistants, nurse practitioners or other licensed health care professionals (the Practitioner), to provide me with telemedicine health care services (the "Services") as deemed necessary by the treating Practitioner. I acknowledge and consent to receive the Services by the Practitioner via telemedicine. I understand that the telemedicine visit will involve communicating with the Practitioner through live audiovisual communication technology and the disclosure of certain medical information by electronic transmission. I acknowledge that I have been given the opportunity to request an in-person assessment or other available alternative prior to the telemedicine visit and am voluntarily participating in the telemedicine visit.  I understand that I have the right to withhold or withdraw my consent to the use of telemedicine in the course of my care at any time, without affecting my right to future care or treatment, and that the Practitioner or I may terminate the telemedicine visit at any time. I understand that I have  the right to inspect all information obtained and/or recorded in the course of the telemedicine visit and may receive copies of available information for a reasonable fee.  I understand that some of the potential risks of receiving the Services via telemedicine include:  Marland Kitchen Delay or interruption in medical evaluation due to technological equipment failure or disruption; . Information transmitted may not be sufficient (e.g. poor resolution of images) to allow for appropriate medical decision making by the Practitioner; and/or  . In rare instances, security protocols could fail, causing a breach of personal health information.  Furthermore, I acknowledge that it is my responsibility to provide information about my medical history, conditions and care that is complete and accurate to the best of my ability. I acknowledge that Practitioner's advice, recommendations, and/or decision may be based on factors not within their control, such as incomplete or inaccurate data provided by me or distortions of diagnostic images or specimens that may result from electronic transmissions. I understand that the practice of medicine is not an exact science and that  Practitioner makes no warranties or guarantees regarding treatment outcomes. I acknowledge that I will receive a copy of this consent concurrently upon execution via email to the email address I last provided but may also request a printed copy by calling the office of [facility].    I understand that my insurance will be billed for this visit.   I have read or had this consent read to me. . I understand the contents of this consent, which adequately explains the benefits and risks of the Services being provided via telemedicine.  . I have been provided ample opportunity to ask questions regarding this consent and the Services and have had my questions answered to my satisfaction. . I give my informed consent for the services to be provided through the use  of telemedicine in my medical care  By participating in this telemedicine visit I agree to the above.

## 2019-01-21 DIAGNOSIS — G894 Chronic pain syndrome: Secondary | ICD-10-CM | POA: Diagnosis not present

## 2019-01-21 DIAGNOSIS — G4700 Insomnia, unspecified: Secondary | ICD-10-CM | POA: Diagnosis not present

## 2019-01-21 DIAGNOSIS — I10 Essential (primary) hypertension: Secondary | ICD-10-CM | POA: Diagnosis not present

## 2019-01-21 DIAGNOSIS — E1165 Type 2 diabetes mellitus with hyperglycemia: Secondary | ICD-10-CM | POA: Diagnosis not present

## 2019-01-21 DIAGNOSIS — E782 Mixed hyperlipidemia: Secondary | ICD-10-CM | POA: Diagnosis not present

## 2019-01-25 ENCOUNTER — Telehealth: Payer: Medicare Other | Admitting: Cardiology

## 2019-01-27 ENCOUNTER — Telehealth (INDEPENDENT_AMBULATORY_CARE_PROVIDER_SITE_OTHER): Payer: Medicare Other | Admitting: Cardiology

## 2019-01-27 ENCOUNTER — Encounter: Payer: Self-pay | Admitting: Cardiology

## 2019-01-27 VITALS — BP 146/75 | Ht 72.0 in | Wt 300.0 lb

## 2019-01-27 DIAGNOSIS — R600 Localized edema: Secondary | ICD-10-CM

## 2019-01-27 DIAGNOSIS — R0602 Shortness of breath: Secondary | ICD-10-CM

## 2019-01-27 DIAGNOSIS — I5032 Chronic diastolic (congestive) heart failure: Secondary | ICD-10-CM

## 2019-01-27 NOTE — Progress Notes (Signed)

## 2019-01-27 NOTE — Addendum Note (Signed)
Addended by: Debbora Lacrosse R on: 01/27/2019 02:53 PM   Modules accepted: Orders

## 2019-01-27 NOTE — Progress Notes (Signed)
Virtual Visit via Telephone Note   This visit type was conducted due to national recommendations for restrictions regarding the COVID-19 Pandemic (e.g. social distancing) in an effort to limit this patient's exposure and mitigate transmission in our community.  Due to his co-morbid illnesses, this patient is at least at moderate risk for complications without adequate follow up.  This format is felt to be most appropriate for this patient at this time.  The patient did not have access to video technology/had technical difficulties with video requiring transitioning to audio format only (telephone).  All issues noted in this document were discussed and addressed.  No physical exam could be performed with this format.  Please refer to the patient's chart for his  consent to telehealth for [facility].   Date:  01/27/2019   ID:  Gregory Alvarado, DOB 1951/07/29, MRN [id number]  Patient Location: Home Provider Location: Home  PCP:  Celene Squibb, MD  Cardiologist:  Carlyle Dolly, MD  Electrophysiologist:  None   Evaluation Performed:  Follow-Up Visit  Chief Complaint:  Follow up visit  History of Present Illness:    Gregory Alvarado is a 68 y.o. male seen today for follow up of the following medical problems.   1. SOB/Leg swelling/Chronic diastolic HF - ongoing 6-8 months. Increased SOB/DOE. DOE just walking in from [employer] lot - cramps with low dose furosemide and torsemide, he has stopped taking   - 08/2018 initial echo poor visualization,suggestion LVEF 40-45%, grade I diastoilc dysfunction - 08/2018 limited contrast echo LVEF 55-60%, no WMAs.    - cramps on both daily lasix and torsemide. We tried chlorthalidone but he stopped taking due to lack of benefit. He is taking lasix 40mg  every other day and tolerating, swelling has been improving. By documented weights he is down 6 lbs. He reports recent labs with pcp. SOB improving    The patient does not have symptoms concerning  for COVID-19 infection (fever, chills, cough, or new shortness of breath).    Past Medical History:  Diagnosis Date  . Arthritis    left shoulder  . COPD (chronic obstructive pulmonary disease) ([place])   . Diabetes mellitus without complication ([place])   . Dyslipidemia   . Hypertension   . Insomnia    Past Surgical History:  Procedure Laterality Date  . COLON RESECTION     DIVERTICULITIS  . HERNIA REPAIR     UHR  . ROTATOR CUFF REPAIR Bilateral   . TYMPANOPLASTY       Current Meds  Medication Sig  . albuterol (PROVENTIL HFA;VENTOLIN HFA) 108 (90 Base) MCG/ACT inhaler Inhale into the lungs every 6 (six) hours as needed for wheezing or shortness of breath.  Marland Kitchen aspirin EC 81 MG tablet Take 81 mg by mouth daily.  . chlorthalidone (HYGROTON) 25 MG tablet Take 0.5 tablets (12.5 mg total) by mouth daily.  . fenofibrate (TRICOR) 145 MG tablet Take 145 mg by mouth daily.  . insulin NPH-regular Human (NOVOLIN 70/30) (70-30) 100 UNIT/ML injection Inject 30 Units into the skin 2 (two) times daily with a meal.  . Insulin Pen Needle (B-D ULTRAFINE III SHORT PEN) 31G X 8 MM MISC 1 each by Does not apply route as directed.  . INSULIN SYRINGE .5CC/29G 29G X 1/2" 0.5 ML MISC Use to inject insulin 2 times a day  . lisinopril (PRINIVIL,ZESTRIL) 20 MG tablet Take 1 tablet (20 mg total) by mouth daily.  . ONE TOUCH ULTRA TEST test strip TEST FOUR TIMES  DAILY AS DIRECTED.  Marland Kitchen simvastatin (ZOCOR) 40 MG tablet Take 40 mg by mouth daily.  . vitamin B-12 (CYANOCOBALAMIN) 250 MCG tablet Take 250 mcg by mouth daily.  Marland Kitchen zolpidem (AMBIEN) 10 MG tablet Take 5-10 mg by mouth at bedtime as needed for sleep.     Allergies:   Caduet [amlodipine-atorvastatin]   Social History   Tobacco Use  . Smoking status: Current Every Day Smoker    Packs/day: 3.00    Types: Cigarettes  . Smokeless tobacco: Never Used  Substance Use Topics  . Alcohol use: No    Comment: rare  . Drug use: No     Family Hx: The  patient's family history includes Diabetes in his mother; Heart attack in his brother, father, and mother.  ROS:   Please see the history of present illness.    All other systems reviewed and are negative.   Prior CV studies:   The following studies were reviewed today:  08/2018 echo Study Conclusions  - Left ventricle: The cavity size was normal. Wall thickness was increased increased in a pattern of mild to moderate LVH. Systolic function was mildly to moderately reduced. The estimated ejection fraction was in the range of 40% to 45%. Diffuse hypokinesis. Doppler parameters are consistent with abnormal left ventricular relaxation (grade 1 diastolic dysfunction). Doppler parameters are consistent with high ventricular filling pressure. - Aortic valve: Valve area (VTI): 3.22 cm^2. Valve area (Vmax): 2.74 cm^2. Valve area (Vmean): 2.29 cm^2. - Left atrium: The atrium was moderately dilated. - Atrial septum: No defect or patent foramen ovale was identified. - Techncically difficult study, recommend limited study with echocontrast to better clarify LVEF. Based on available images appears mildly decreased.  08/2018 contrast echo Study Conclusions  - Left ventricle: Limited study with Definity contrast. Systolic function was normal. The estimated ejection fraction was in the range of 55% to 60%. Wall motion was normal; there were no regional wall motion abnormalities. - Mitral valve: There was trivial regurgitation. - Pericardium, extracardiac: A prominent pericardial fat pad was present.   Labs/Other Tests and Data Reviewed:    EKG:    Recent Labs: No results found for requested labs within last 8760 hours.   Recent Lipid Panel Lab Results  Component Value Date/Time   CHOL 186 02/18/2017 07:37 AM   TRIG 329 (H) 02/18/2017 07:37 AM   HDL 28 (L) 02/18/2017 07:37 AM   LDLCALC 92 02/18/2017 07:37 AM    Wt Readings from Last 3 Encounters:   01/27/19 300 lb (136.1 kg)  10/13/18 (!) 306 lb (138.8 kg)  08/26/18 (!) 303 lb (137.4 kg)     Objective:    Vital Signs:  BP (!) 146/75   Ht 6' (1.829 m)   Wt 300 lb (136.1 kg)   BMI 40.69 kg/m    Normal affect. Normal speech pattern and tone. Comfortable, no apparent distress. No audible signs of SOB or wheezing.   ASSESSMENT & PLAN:    1. Chronic diastolic HF - difficultly finding a diuretic regimen as outlined above due to side effects, tolerating lasix 40mg  qod and will continue - continue to limit sodium intake - overall doing well    COVID-19 Education: The signs and symptoms of COVID-19 were discussed with the patient and how to seek care for testing (follow up with PCP or arrange E-visit).  The importance of social distancing was discussed today.  Time:   Today, I have spent 14 minutes with the patient with telehealth technology discussing  the above problems.     Medication Adjustments/Labs and Tests Ordered: Current medicines are reviewed at length with the patient today.  Concerns regarding medicines are outlined above.   Tests Ordered: No orders of the defined types were placed in this encounter.   Medication Changes: No orders of the defined types were placed in this encounter.   Disposition:  Follow up 6 months  Signed, Carlyle Dolly, MD  01/27/2019 2:06 PM    [place]

## 2019-02-17 ENCOUNTER — Ambulatory Visit: Payer: Medicare Other | Admitting: Gastroenterology

## 2019-05-16 DIAGNOSIS — I10 Essential (primary) hypertension: Secondary | ICD-10-CM | POA: Diagnosis not present

## 2019-05-16 DIAGNOSIS — E782 Mixed hyperlipidemia: Secondary | ICD-10-CM | POA: Diagnosis not present

## 2019-05-16 DIAGNOSIS — E1165 Type 2 diabetes mellitus with hyperglycemia: Secondary | ICD-10-CM | POA: Diagnosis not present

## 2019-05-16 DIAGNOSIS — G4700 Insomnia, unspecified: Secondary | ICD-10-CM | POA: Diagnosis not present

## 2019-05-16 DIAGNOSIS — G894 Chronic pain syndrome: Secondary | ICD-10-CM | POA: Diagnosis not present

## 2019-05-31 DIAGNOSIS — D509 Iron deficiency anemia, unspecified: Secondary | ICD-10-CM | POA: Diagnosis not present

## 2019-05-31 DIAGNOSIS — E782 Mixed hyperlipidemia: Secondary | ICD-10-CM | POA: Diagnosis not present

## 2019-05-31 DIAGNOSIS — I10 Essential (primary) hypertension: Secondary | ICD-10-CM | POA: Diagnosis not present

## 2019-05-31 DIAGNOSIS — E1165 Type 2 diabetes mellitus with hyperglycemia: Secondary | ICD-10-CM | POA: Diagnosis not present

## 2019-06-05 DIAGNOSIS — E782 Mixed hyperlipidemia: Secondary | ICD-10-CM | POA: Diagnosis not present

## 2019-06-05 DIAGNOSIS — E1165 Type 2 diabetes mellitus with hyperglycemia: Secondary | ICD-10-CM | POA: Diagnosis not present

## 2019-06-05 DIAGNOSIS — I10 Essential (primary) hypertension: Secondary | ICD-10-CM | POA: Diagnosis not present

## 2019-06-05 DIAGNOSIS — G4700 Insomnia, unspecified: Secondary | ICD-10-CM | POA: Diagnosis not present

## 2019-06-05 DIAGNOSIS — D509 Iron deficiency anemia, unspecified: Secondary | ICD-10-CM | POA: Diagnosis not present

## 2019-06-14 ENCOUNTER — Other Ambulatory Visit: Payer: Self-pay

## 2019-06-14 NOTE — Patient Outreach (Signed)
[facility]) Care Management  06/14/2019  [place] Ottmar Fehnel March 26, 1951 [id number]   Medication Adherence call to Mr. Alante Leppanen Hippa Identifiers Verify spoke with patient he is past due on Metformin 500 mg patient explain he is no longer taking this medication doctor took him off he was having side effects,he is now using insulin instead. Mr. Arcos is showing past due under [place].   [facility] Management Direct Dial [phone]  Fax [phone] [email or web address]

## 2019-07-17 ENCOUNTER — Other Ambulatory Visit: Payer: Self-pay

## 2019-07-17 NOTE — Patient Outreach (Signed)
[facility]) Care Management  07/17/2019  Yeraldo Dorff Chismar [birth year] [id number]   Medication Adherence call to Mr. Finneus Mccadden Hippa Identifiers Verify spoke with patient he is past due on Metformin 500 mg patient explain he is no longer taking this medication. Mr. Baylis is showing past due under [place].   [facility] Management Direct Dial [phone]  Fax [phone] [email or web address]

## 2019-07-18 DIAGNOSIS — E1165 Type 2 diabetes mellitus with hyperglycemia: Secondary | ICD-10-CM | POA: Diagnosis not present

## 2019-07-18 DIAGNOSIS — I10 Essential (primary) hypertension: Secondary | ICD-10-CM | POA: Diagnosis not present

## 2019-07-18 DIAGNOSIS — G4700 Insomnia, unspecified: Secondary | ICD-10-CM | POA: Diagnosis not present

## 2019-07-18 DIAGNOSIS — D509 Iron deficiency anemia, unspecified: Secondary | ICD-10-CM | POA: Diagnosis not present

## 2019-07-18 DIAGNOSIS — E782 Mixed hyperlipidemia: Secondary | ICD-10-CM | POA: Diagnosis not present

## 2019-08-22 DIAGNOSIS — E782 Mixed hyperlipidemia: Secondary | ICD-10-CM | POA: Diagnosis not present

## 2019-08-22 DIAGNOSIS — I10 Essential (primary) hypertension: Secondary | ICD-10-CM | POA: Diagnosis not present

## 2019-08-22 DIAGNOSIS — J449 Chronic obstructive pulmonary disease, unspecified: Secondary | ICD-10-CM | POA: Diagnosis not present

## 2019-08-22 DIAGNOSIS — D509 Iron deficiency anemia, unspecified: Secondary | ICD-10-CM | POA: Diagnosis not present

## 2019-08-22 DIAGNOSIS — E1165 Type 2 diabetes mellitus with hyperglycemia: Secondary | ICD-10-CM | POA: Diagnosis not present

## 2019-09-20 DIAGNOSIS — I10 Essential (primary) hypertension: Secondary | ICD-10-CM | POA: Diagnosis not present

## 2019-09-20 DIAGNOSIS — E782 Mixed hyperlipidemia: Secondary | ICD-10-CM | POA: Diagnosis not present

## 2019-09-20 DIAGNOSIS — E1165 Type 2 diabetes mellitus with hyperglycemia: Secondary | ICD-10-CM | POA: Diagnosis not present

## 2019-09-20 DIAGNOSIS — D509 Iron deficiency anemia, unspecified: Secondary | ICD-10-CM | POA: Diagnosis not present

## 2019-09-20 DIAGNOSIS — G4700 Insomnia, unspecified: Secondary | ICD-10-CM | POA: Diagnosis not present

## 2019-09-21 DIAGNOSIS — D509 Iron deficiency anemia, unspecified: Secondary | ICD-10-CM | POA: Diagnosis not present

## 2019-09-21 DIAGNOSIS — J449 Chronic obstructive pulmonary disease, unspecified: Secondary | ICD-10-CM | POA: Diagnosis not present

## 2019-09-21 DIAGNOSIS — I10 Essential (primary) hypertension: Secondary | ICD-10-CM | POA: Diagnosis not present

## 2019-09-21 DIAGNOSIS — E782 Mixed hyperlipidemia: Secondary | ICD-10-CM | POA: Diagnosis not present

## 2019-09-21 DIAGNOSIS — E1165 Type 2 diabetes mellitus with hyperglycemia: Secondary | ICD-10-CM | POA: Diagnosis not present

## 2019-10-23 DIAGNOSIS — D509 Iron deficiency anemia, unspecified: Secondary | ICD-10-CM | POA: Diagnosis not present

## 2019-10-23 DIAGNOSIS — E1165 Type 2 diabetes mellitus with hyperglycemia: Secondary | ICD-10-CM | POA: Diagnosis not present

## 2019-10-23 DIAGNOSIS — E782 Mixed hyperlipidemia: Secondary | ICD-10-CM | POA: Diagnosis not present

## 2019-10-23 DIAGNOSIS — J449 Chronic obstructive pulmonary disease, unspecified: Secondary | ICD-10-CM | POA: Diagnosis not present

## 2019-10-23 DIAGNOSIS — I10 Essential (primary) hypertension: Secondary | ICD-10-CM | POA: Diagnosis not present

## 2019-10-24 DIAGNOSIS — D509 Iron deficiency anemia, unspecified: Secondary | ICD-10-CM | POA: Diagnosis not present

## 2019-10-24 DIAGNOSIS — R944 Abnormal results of kidney function studies: Secondary | ICD-10-CM | POA: Diagnosis not present

## 2019-10-24 DIAGNOSIS — I10 Essential (primary) hypertension: Secondary | ICD-10-CM | POA: Diagnosis not present

## 2019-10-24 DIAGNOSIS — M545 Low back pain: Secondary | ICD-10-CM | POA: Diagnosis not present

## 2019-10-24 DIAGNOSIS — Z0000 Encounter for general adult medical examination without abnormal findings: Secondary | ICD-10-CM | POA: Diagnosis not present

## 2019-10-24 DIAGNOSIS — Z712 Person consulting for explanation of examination or test findings: Secondary | ICD-10-CM | POA: Diagnosis not present

## 2019-10-24 DIAGNOSIS — D72829 Elevated white blood cell count, unspecified: Secondary | ICD-10-CM | POA: Diagnosis not present

## 2019-10-24 DIAGNOSIS — E1165 Type 2 diabetes mellitus with hyperglycemia: Secondary | ICD-10-CM | POA: Diagnosis not present

## 2019-10-24 DIAGNOSIS — G4700 Insomnia, unspecified: Secondary | ICD-10-CM | POA: Diagnosis not present

## 2019-10-24 DIAGNOSIS — E782 Mixed hyperlipidemia: Secondary | ICD-10-CM | POA: Diagnosis not present

## 2019-10-24 DIAGNOSIS — Z720 Tobacco use: Secondary | ICD-10-CM | POA: Diagnosis not present

## 2019-10-26 ENCOUNTER — Inpatient Hospital Stay (HOSPITAL_COMMUNITY)
Admission: EM | Admit: 2019-10-26 | Discharge: 2019-10-29 | DRG: 377 | Disposition: A | Discharge status: Home / Self Care | Payer: Medicare Other | Attending: Family Medicine | Admitting: Family Medicine

## 2019-10-26 ENCOUNTER — Encounter (HOSPITAL_COMMUNITY): Payer: Self-pay

## 2019-10-26 ENCOUNTER — Other Ambulatory Visit: Payer: Self-pay

## 2019-10-26 DIAGNOSIS — Z79899 Other long term (current) drug therapy: Secondary | ICD-10-CM

## 2019-10-26 DIAGNOSIS — K921 Melena: Secondary | ICD-10-CM | POA: Diagnosis not present

## 2019-10-26 DIAGNOSIS — E1122 Type 2 diabetes mellitus with diabetic chronic kidney disease: Secondary | ICD-10-CM | POA: Diagnosis not present

## 2019-10-26 DIAGNOSIS — K259 Gastric ulcer, unspecified as acute or chronic, without hemorrhage or perforation: Secondary | ICD-10-CM | POA: Diagnosis not present

## 2019-10-26 DIAGNOSIS — Z9119 Patient's noncompliance with other medical treatment and regimen: Secondary | ICD-10-CM | POA: Diagnosis not present

## 2019-10-26 DIAGNOSIS — R000 Tachycardia, unspecified: Secondary | ICD-10-CM | POA: Diagnosis not present

## 2019-10-26 DIAGNOSIS — B9681 Helicobacter pylori [H. pylori] as the cause of diseases classified elsewhere: Secondary | ICD-10-CM | POA: Diagnosis not present

## 2019-10-26 DIAGNOSIS — M19012 Primary osteoarthritis, left shoulder: Secondary | ICD-10-CM | POA: Diagnosis not present

## 2019-10-26 DIAGNOSIS — K2970 Gastritis, unspecified, without bleeding: Secondary | ICD-10-CM | POA: Diagnosis not present

## 2019-10-26 DIAGNOSIS — Z720 Tobacco use: Secondary | ICD-10-CM | POA: Diagnosis present

## 2019-10-26 DIAGNOSIS — E538 Deficiency of other specified B group vitamins: Secondary | ICD-10-CM | POA: Diagnosis present

## 2019-10-26 DIAGNOSIS — K5909 Other constipation: Secondary | ICD-10-CM | POA: Diagnosis present

## 2019-10-26 DIAGNOSIS — E785 Hyperlipidemia, unspecified: Secondary | ICD-10-CM | POA: Diagnosis not present

## 2019-10-26 DIAGNOSIS — Z7982 Long term (current) use of aspirin: Secondary | ICD-10-CM

## 2019-10-26 DIAGNOSIS — D509 Iron deficiency anemia, unspecified: Secondary | ICD-10-CM | POA: Diagnosis not present

## 2019-10-26 DIAGNOSIS — K922 Gastrointestinal hemorrhage, unspecified: Secondary | ICD-10-CM | POA: Diagnosis present

## 2019-10-26 DIAGNOSIS — K2951 Unspecified chronic gastritis with bleeding: Principal | ICD-10-CM | POA: Diagnosis present

## 2019-10-26 DIAGNOSIS — Z833 Family history of diabetes mellitus: Secondary | ICD-10-CM

## 2019-10-26 DIAGNOSIS — F17210 Nicotine dependence, cigarettes, uncomplicated: Secondary | ICD-10-CM | POA: Diagnosis present

## 2019-10-26 DIAGNOSIS — E1165 Type 2 diabetes mellitus with hyperglycemia: Secondary | ICD-10-CM | POA: Diagnosis present

## 2019-10-26 DIAGNOSIS — D62 Acute posthemorrhagic anemia: Secondary | ICD-10-CM | POA: Diagnosis not present

## 2019-10-26 DIAGNOSIS — F17200 Nicotine dependence, unspecified, uncomplicated: Secondary | ICD-10-CM | POA: Diagnosis not present

## 2019-10-26 DIAGNOSIS — Q394 Esophageal web: Secondary | ICD-10-CM | POA: Diagnosis not present

## 2019-10-26 DIAGNOSIS — K5900 Constipation, unspecified: Secondary | ICD-10-CM | POA: Diagnosis not present

## 2019-10-26 DIAGNOSIS — N183 Chronic kidney disease, stage 3 unspecified: Secondary | ICD-10-CM | POA: Diagnosis not present

## 2019-10-26 DIAGNOSIS — K625 Hemorrhage of anus and rectum: Secondary | ICD-10-CM

## 2019-10-26 DIAGNOSIS — Z794 Long term (current) use of insulin: Secondary | ICD-10-CM

## 2019-10-26 DIAGNOSIS — K2980 Duodenitis without bleeding: Secondary | ICD-10-CM | POA: Diagnosis present

## 2019-10-26 DIAGNOSIS — Z20822 Contact with and (suspected) exposure to covid-19: Secondary | ICD-10-CM | POA: Diagnosis present

## 2019-10-26 DIAGNOSIS — E66813 Obesity, class 3: Secondary | ICD-10-CM | POA: Diagnosis present

## 2019-10-26 DIAGNOSIS — I129 Hypertensive chronic kidney disease with stage 1 through stage 4 chronic kidney disease, or unspecified chronic kidney disease: Secondary | ICD-10-CM | POA: Diagnosis present

## 2019-10-26 DIAGNOSIS — Z8249 Family history of ischemic heart disease and other diseases of the circulatory system: Secondary | ICD-10-CM

## 2019-10-26 DIAGNOSIS — Z6841 Body Mass Index (BMI) 40.0 and over, adult: Secondary | ICD-10-CM | POA: Diagnosis not present

## 2019-10-26 DIAGNOSIS — Z888 Allergy status to other drugs, medicaments and biological substances status: Secondary | ICD-10-CM | POA: Diagnosis not present

## 2019-10-26 DIAGNOSIS — K219 Gastro-esophageal reflux disease without esophagitis: Secondary | ICD-10-CM | POA: Diagnosis not present

## 2019-10-26 DIAGNOSIS — E119 Type 2 diabetes mellitus without complications: Secondary | ICD-10-CM | POA: Diagnosis present

## 2019-10-26 DIAGNOSIS — R1310 Dysphagia, unspecified: Secondary | ICD-10-CM | POA: Diagnosis not present

## 2019-10-26 DIAGNOSIS — I10 Essential (primary) hypertension: Secondary | ICD-10-CM | POA: Diagnosis present

## 2019-10-26 DIAGNOSIS — D649 Anemia, unspecified: Secondary | ICD-10-CM | POA: Diagnosis present

## 2019-10-26 DIAGNOSIS — J449 Chronic obstructive pulmonary disease, unspecified: Secondary | ICD-10-CM | POA: Diagnosis not present

## 2019-10-26 DIAGNOSIS — Z91199 Patient's noncompliance with other medical treatment and regimen due to unspecified reason: Secondary | ICD-10-CM

## 2019-10-26 DIAGNOSIS — K2950 Unspecified chronic gastritis without bleeding: Secondary | ICD-10-CM | POA: Diagnosis not present

## 2019-10-26 DIAGNOSIS — J441 Chronic obstructive pulmonary disease with (acute) exacerbation: Secondary | ICD-10-CM | POA: Diagnosis not present

## 2019-10-26 HISTORY — DX: Tobacco use: Z72.0

## 2019-10-26 HISTORY — DX: Morbid (severe) obesity due to excess calories: E66.01

## 2019-10-26 LAB — GLUCOSE, CAPILLARY
Glucose-Capillary: 79 mg/dL (ref 70–99)
Glucose-Capillary: 101 mg/dL — ABNORMAL HIGH (ref 70–99)

## 2019-10-26 LAB — COMPREHENSIVE METABOLIC PANEL
ALT: 13 U/L (ref 0–44)
AST: 12 U/L — ABNORMAL LOW (ref 15–41)
Albumin: 3.3 g/dL — ABNORMAL LOW (ref 3.5–5.0)
Alkaline Phosphatase: 74 U/L (ref 38–126)
Anion gap: 5 (ref 5–15)
BUN: 17 mg/dL (ref 8–23)
CO2: 28 mmol/L (ref 22–32)
Calcium: 8.3 mg/dL — ABNORMAL LOW (ref 8.9–10.3)
Chloride: 103 mmol/L (ref 98–111)
Creatinine, Ser: 1.4 mg/dL — ABNORMAL HIGH (ref 0.61–1.24)
GFR calc Af Amer: >60 mL/min (ref >60)
GFR calc non Af Amer: 52 mL/min — ABNORMAL LOW (ref >60)
Glucose, Bld: 119 mg/dL — ABNORMAL HIGH (ref 70–99)
Potassium: 4.1 mmol/L (ref 3.5–5.1)
Sodium: 136 mmol/L (ref 135–145)
Total Bilirubin: 0.5 mg/dL (ref 0.3–1.2)
Total Protein: 6.6 g/dL (ref 6.5–8.1)

## 2019-10-26 LAB — HIV ANTIBODY (ROUTINE TESTING W REFLEX): HIV Screen 4th Generation wRfx: NONREACTIVE

## 2019-10-26 LAB — CBC WITH DIFFERENTIAL/PLATELET
Abs Immature Granulocytes: 0.13 10*3/uL — ABNORMAL HIGH (ref 0.00–0.07)
Basophils Absolute: 0 10*3/uL (ref 0.0–0.1)
Basophils Relative: 0 %
Eosinophils Absolute: 0.2 10*3/uL (ref 0.0–0.5)
Eosinophils Relative: 2 %
HCT: 21.5 % — ABNORMAL LOW (ref 39.0–52.0)
Hemoglobin: 5.4 g/dL — CL (ref 13.0–17.0)
Immature Granulocytes: 1 %
Lymphocytes Relative: 7 %
Lymphs Abs: 0.9 10*3/uL (ref 0.7–4.0)
MCH: 18.5 pg — ABNORMAL LOW (ref 26.0–34.0)
MCHC: 25.1 g/dL — ABNORMAL LOW (ref 30.0–36.0)
MCV: 73.6 fL — ABNORMAL LOW (ref 80.0–100.0)
Monocytes Absolute: 0.8 10*3/uL (ref 0.1–1.0)
Monocytes Relative: 7 %
Neutro Abs: 10.2 10*3/uL — ABNORMAL HIGH (ref 1.7–7.7)
Neutrophils Relative %: 83 %
Platelets: 298 10*3/uL (ref 150–400)
RBC: 2.92 MIL/uL — ABNORMAL LOW (ref 4.22–5.81)
RDW: 19.6 % — ABNORMAL HIGH (ref 11.5–15.5)
WBC: 12.3 10*3/uL — ABNORMAL HIGH (ref 4.0–10.5)
nRBC: 0.2 % (ref 0.0–0.2)

## 2019-10-26 LAB — IRON AND TIBC
Iron: 12 ug/dL — ABNORMAL LOW (ref 45–182)
Saturation Ratios: 2 % — ABNORMAL LOW (ref 17.9–39.5)
TIBC: 516 ug/dL — ABNORMAL HIGH (ref 250–450)
UIBC: 504 ug/dL

## 2019-10-26 LAB — PROTIME-INR
INR: 1.1 (ref 0.8–1.2)
Prothrombin Time: 14.3 seconds (ref 11.4–15.2)

## 2019-10-26 LAB — RETICULOCYTES
Immature Retic Fract: 32.1 % — ABNORMAL HIGH (ref 2.3–15.9)
RBC.: 2.9 MIL/uL — ABNORMAL LOW (ref 4.22–5.81)
Retic Count, Absolute: 105.4 10*3/uL (ref 19.0–186.0)
Retic Ct Pct: 3.6 % — ABNORMAL HIGH (ref 0.4–3.1)

## 2019-10-26 LAB — FERRITIN: Ferritin: 4 ng/mL — ABNORMAL LOW (ref 24–336)

## 2019-10-26 LAB — PREPARE RBC (CROSSMATCH)

## 2019-10-26 LAB — SARS CORONAVIRUS 2 (TAT 6-24 HRS): SARS Coronavirus 2: NEGATIVE

## 2019-10-26 LAB — HEMOGLOBIN A1C
Hgb A1c MFr Bld: 6.2 % — ABNORMAL HIGH (ref 4.8–5.6)
Mean Plasma Glucose: 131.24 mg/dL

## 2019-10-26 LAB — ABO/RH: ABO/RH(D): A POS

## 2019-10-26 LAB — POC OCCULT BLOOD, ED: Fecal Occult Bld: POSITIVE — AB

## 2019-10-26 LAB — VITAMIN B12: Vitamin B-12: 224 pg/mL (ref 180–914)

## 2019-10-26 LAB — FOLATE: Folate: 3.7 ng/mL — ABNORMAL LOW (ref >5.9)

## 2019-10-26 LAB — CBG MONITORING, ED: Glucose-Capillary: 125 mg/dL — ABNORMAL HIGH (ref 70–99)

## 2019-10-26 MED ORDER — POLYETHYLENE GLYCOL 3350 17 G PO PACK: 17.0000 g | PACK | Freq: Every day | ORAL | Status: DC | PRN

## 2019-10-26 MED ORDER — SODIUM CHLORIDE 0.9% FLUSH
3.0000 mL | Freq: Two times a day (BID) | INTRAVENOUS | Status: DC
  Administered 2019-10-26 – 2019-10-29 (×7): 3 mL via INTRAVENOUS

## 2019-10-26 MED ORDER — ONDANSETRON HCL 4 MG/2ML IJ SOLN: 4.0000 mg | Freq: Four times a day (QID) | INTRAMUSCULAR | Status: DC | PRN

## 2019-10-26 MED ORDER — INSULIN ASPART 100 UNIT/ML ~~LOC~~ SOLN
0.0000 [IU] | Freq: Three times a day (TID) | SUBCUTANEOUS | Status: DC
  Administered 2019-10-26: 12:00:00 2 [IU] via SUBCUTANEOUS
  Administered 2019-10-27: 17:00:00 5 [IU] via SUBCUTANEOUS
  Administered 2019-10-27: 13:00:00 2 [IU] via SUBCUTANEOUS
  Administered 2019-10-28: 8 [IU] via SUBCUTANEOUS
  Administered 2019-10-28 – 2019-10-29 (×2): 5 [IU] via SUBCUTANEOUS
  Administered 2019-10-29: 12:00:00 8 [IU] via SUBCUTANEOUS
  Filled 2019-10-26: qty 1

## 2019-10-26 MED ORDER — VITAMIN B-12 100 MCG PO TABS
250.0000 ug | ORAL_TABLET | Freq: Every day | ORAL | Status: DC
  Administered 2019-10-26 – 2019-10-28 (×3): 250 ug via ORAL
  Filled 2019-10-26 (×5): qty 3

## 2019-10-26 MED ORDER — ALBUTEROL SULFATE (2.5 MG/3ML) 0.083% IN NEBU: 2.5000 mg | INHALATION_SOLUTION | RESPIRATORY_TRACT | Status: DC | PRN

## 2019-10-26 MED ORDER — SODIUM CHLORIDE 0.9% IV SOLUTION: Freq: Once | INTRAVENOUS | Status: AC

## 2019-10-26 MED ORDER — ONDANSETRON HCL 4 MG PO TABS: 4.0000 mg | ORAL_TABLET | Freq: Four times a day (QID) | ORAL | Status: DC | PRN

## 2019-10-26 MED ORDER — FUROSEMIDE 10 MG/ML IJ SOLN: 20.0000 mg | Freq: Once | INTRAMUSCULAR | Status: DC

## 2019-10-26 MED ORDER — SODIUM CHLORIDE 0.9 % IV BOLUS
500.0000 mL | Freq: Once | INTRAVENOUS | Status: AC
  Administered 2019-10-26: 10:00:00 500 mL via INTRAVENOUS

## 2019-10-26 MED ORDER — FOLIC ACID 1 MG PO TABS
1.0000 mg | ORAL_TABLET | Freq: Every day | ORAL | Status: DC
  Administered 2019-10-26 – 2019-10-29 (×4): 1 mg via ORAL
  Filled 2019-10-26 (×4): qty 1

## 2019-10-26 MED ORDER — ACETAMINOPHEN 325 MG PO TABS
650.0000 mg | ORAL_TABLET | Freq: Four times a day (QID) | ORAL | Status: DC | PRN
  Administered 2019-10-27: 650 mg via ORAL
  Filled 2019-10-26: qty 2

## 2019-10-26 MED ORDER — PANTOPRAZOLE SODIUM 40 MG IV SOLR
40.0000 mg | Freq: Two times a day (BID) | INTRAVENOUS | Status: DC
  Administered 2019-10-26 – 2019-10-28 (×5): 40 mg via INTRAVENOUS
  Filled 2019-10-26 (×5): qty 40

## 2019-10-26 MED ORDER — TRAZODONE HCL 50 MG PO TABS
50.0000 mg | ORAL_TABLET | Freq: Every evening | ORAL | Status: DC | PRN
  Administered 2019-10-26: 50 mg via ORAL
  Filled 2019-10-26: qty 1

## 2019-10-26 MED ORDER — SODIUM CHLORIDE 0.9 % IV SOLN: 250.0000 mL | INTRAVENOUS | Status: DC | PRN

## 2019-10-26 MED ORDER — LISINOPRIL 10 MG PO TABS
20.0000 mg | ORAL_TABLET | Freq: Every day | ORAL | Status: DC
Start: 2019-10-26 — End: 2019-10-29
  Administered 2019-10-26 – 2019-10-29 (×4): 20 mg via ORAL
  Filled 2019-10-26 (×4): qty 2

## 2019-10-26 MED ORDER — ATORVASTATIN CALCIUM 40 MG PO TABS
40.0000 mg | ORAL_TABLET | Freq: Every day | ORAL | Status: DC
  Administered 2019-10-26 – 2019-10-28 (×3): 40 mg via ORAL
  Filled 2019-10-26 (×3): qty 1

## 2019-10-26 MED ORDER — ACETAMINOPHEN 650 MG RE SUPP: 650.0000 mg | Freq: Four times a day (QID) | RECTAL | Status: DC | PRN

## 2019-10-26 MED ORDER — SODIUM CHLORIDE 0.9% FLUSH: 3.0000 mL | INTRAVENOUS | Status: DC | PRN

## 2019-10-26 MED ORDER — ZOLPIDEM TARTRATE 5 MG PO TABS
5.0000 mg | ORAL_TABLET | Freq: Every evening | ORAL | Status: DC | PRN
  Administered 2019-10-26: 21:00:00 5 mg via ORAL
  Filled 2019-10-26: qty 1

## 2019-10-26 MED ORDER — ALBUTEROL SULFATE HFA 108 (90 BASE) MCG/ACT IN AERS: 2.0000 | INHALATION_SPRAY | RESPIRATORY_TRACT | Status: DC | PRN

## 2019-10-26 MED ORDER — CYANOCOBALAMIN 1000 MCG/ML IJ SOLN
1000.0000 ug | Freq: Once | INTRAMUSCULAR | Status: AC
  Administered 2019-10-26: 1000 ug via INTRAMUSCULAR
  Filled 2019-10-26: qty 1

## 2019-10-26 MED ORDER — INSULIN ASPART 100 UNIT/ML ~~LOC~~ SOLN
0.0000 [IU] | Freq: Every day | SUBCUTANEOUS | Status: DC
  Administered 2019-10-27: 3 [IU] via SUBCUTANEOUS
  Administered 2019-10-28: 21:00:00 2 [IU] via SUBCUTANEOUS

## 2019-10-26 MED ORDER — FUROSEMIDE 10 MG/ML IJ SOLN
40.0000 mg | Freq: Once | INTRAMUSCULAR | Status: AC
  Administered 2019-10-26: 19:00:00 40 mg via INTRAVENOUS
  Filled 2019-10-26: qty 4

## 2019-10-26 MED ORDER — LINACLOTIDE 145 MCG PO CAPS
145.0000 ug | ORAL_CAPSULE | Freq: Every day | ORAL | Status: DC
  Administered 2019-10-26 – 2019-10-29 (×4): 145 ug via ORAL
  Filled 2019-10-26 (×4): qty 1

## 2019-10-26 NOTE — Consult Note (Addendum)
Referring Provider: Roxan Hockey, MD Primary Care Physician:  Celene Squibb, MD Primary Gastroenterologist:  Garfield Cornea, MD (previously unassigned)  Reason for Consultation:  IDA, GI bleeding  HPI: Gregory Alvarado is a 69 y.o. male presenting to the ED this morning due to low hemoglobin at the request of PCP.  Patient reports progressive fatigue, dyspnea on exertion, black stools off and on for 6 months.  Heartburn depending on what he eats.  No dysphagia.  No vomiting.  No abdominal pain.  Chronically constipated for years.  BM 1-2 times per week.  Failed over-the-counter stool softeners, MiraLAX.  Denies bright red blood per rectum.  Remote colonoscopy prior to segmental colon resection for complicated diverticulitis back in the 1990s.  No prior upper endoscopy.  No family history of colon cancer.  Admits to frequent aspirin powder use.  At baseline some shortness of breath and dyspnea on exertion but symptoms have definitely worsened over the past several weeks.  Having increased issues with edema.  In the ED, BUN 17, creatinine 1.4, albumin 3.3 otherwise LFTs normal.  White blood cell count 12,300, hemoglobin 5.4, MCV 73.6, platelets 298,000.  Hemoglobin normal 2 years ago.  INR 1.1.  Folate 3.7, B12 normal.  Iron 12, TIBC 516, iron saturations 2%, ferritin 4.  On digital rectal exam, stool was brown, heme positive.   Prior to Admission medications   Medication Sig Start Date End Date Taking? Authorizing Provider  albuterol (PROVENTIL HFA;VENTOLIN HFA) 108 (90 Base) MCG/ACT inhaler Inhale into the lungs every 6 (six) hours as needed for wheezing or shortness of breath.   Yes [provider]  aspirin EC 81 MG tablet Take 81 mg by mouth daily.   Yes [provider]  atorvastatin (LIPITOR) 40 MG tablet Take 40 mg by mouth at bedtime. 08/30/19  Yes [provider]  furosemide (LASIX) 40 MG tablet Take 40 mg by mouth every other day.  01/21/19  Yes [provider]  insulin NPH-regular Human (NOVOLIN 70/30) (70-30) 100 UNIT/ML injection Inject 30 Units into the skin 2 (two) times daily with a meal.   Yes [provider]  Insulin Pen Needle (B-D ULTRAFINE III SHORT PEN) 31G X 8 MM MISC 1 each by Does not apply route as directed. 01/05/17  Yes Nida, Marella Chimes, MD  INSULIN SYRINGE .5CC/29G 29G X 1/2" 0.5 ML MISC Use to inject insulin 2 times a day 02/24/17  Yes Nida, Marella Chimes, MD  lisinopril (PRINIVIL,ZESTRIL) 20 MG tablet Take 1 tablet (20 mg total) by mouth daily. 06/09/17  Yes Nida, Marella Chimes, MD  traMADol (ULTRAM) 50 MG tablet Take 50 mg by mouth every 6 (six) hours as needed. 09/20/19  Yes [provider]  vitamin B-12 (CYANOCOBALAMIN) 250 MCG tablet Take 250 mcg by mouth daily.   Yes [provider]  zolpidem (AMBIEN) 10 MG tablet Take 5-10 mg by mouth at bedtime as needed for sleep.   Yes [provider]  ONE TOUCH ULTRA TEST test strip TEST FOUR TIMES DAILY AS DIRECTED. 05/21/17   Cassandria Anger, MD    Current Facility-Administered Medications  Medication Dose Route Frequency Provider Last Rate Last Admin   insulin aspart (novoLOG) injection 0-15 Units  0-15 Units Subcutaneous TID WC Emokpae, Courage, MD   2 Units at 10/26/19 1212   insulin aspart (novoLOG) injection 0-5 Units  0-5 Units Subcutaneous QHS Emokpae, Courage, MD       pantoprazole (PROTONIX) injection 40 mg  40 mg  Intravenous Q12H Roxan Hockey, MD   40 mg at 10/26/19 1212   Current Outpatient Medications  Medication Sig Dispense Refill   albuterol (PROVENTIL HFA;VENTOLIN HFA) 108 (90 Base) MCG/ACT inhaler Inhale into the lungs every 6 (six) hours as needed for wheezing or shortness of breath.     aspirin EC 81 MG tablet Take 81 mg by mouth daily.     atorvastatin (LIPITOR) 40 MG tablet Take 40 mg by mouth at bedtime.     furosemide (LASIX) 40 MG tablet Take 40 mg by mouth every other day.      insulin NPH-regular Human  (NOVOLIN 70/30) (70-30) 100 UNIT/ML injection Inject 30 Units into the skin 2 (two) times daily with a meal.     Insulin Pen Needle (B-D ULTRAFINE III SHORT PEN) 31G X 8 MM MISC 1 each by Does not apply route as directed. 100 each 3   INSULIN SYRINGE .5CC/29G 29G X 1/2" 0.5 ML MISC Use to inject insulin 2 times a day 100 each 3   lisinopril (PRINIVIL,ZESTRIL) 20 MG tablet Take 1 tablet (20 mg total) by mouth daily. 30 tablet 6   traMADol (ULTRAM) 50 MG tablet Take 50 mg by mouth every 6 (six) hours as needed.     vitamin B-12 (CYANOCOBALAMIN) 250 MCG tablet Take 250 mcg by mouth daily.     zolpidem (AMBIEN) 10 MG tablet Take 5-10 mg by mouth at bedtime as needed for sleep.     ONE TOUCH ULTRA TEST test strip TEST FOUR TIMES DAILY AS DIRECTED. 150 each 5    Allergies as of 10/26/2019 - Review Complete 10/26/2019  Allergen Reaction Noted   Caduet [amlodipine-atorvastatin]  09/18/2015    Past Medical History:  Diagnosis Date   Arthritis    left shoulder   COPD (chronic obstructive pulmonary disease) ([place])    Diabetes mellitus without complication ([place])    Dyslipidemia    Hypertension    Insomnia    Insomnia    Morbid obesity ([place])    Tobacco use     Past Surgical History:  Procedure Laterality Date   COLON RESECTION  1990s?   DIVERTICULITIS   HERNIA REPAIR     UHR   ROTATOR CUFF REPAIR Bilateral    TYMPANOPLASTY      Family History  Problem Relation Age of Onset   Diabetes Mother    Heart attack Mother    Heart attack Father    Heart attack Brother    Colon cancer Neg Hx     Social History   Socioeconomic History   Marital status: Divorced    Spouse name: Not on file   Number of children: Not on file   Years of education: Not on file   Highest education level: Not on file  Occupational History   Not on file  Tobacco Use   Smoking status: Current Every Day Smoker    Packs/day: 3.00    Types: Cigarettes   Smokeless tobacco: Never Used  Substance and Sexual  Activity   Alcohol use: No    Comment: rare   Drug use: No   Sexual activity: Not on file  Other Topics Concern   Not on file  Social History Narrative   Not on file   Social Determinants of Health   Financial Resource Strain:    Difficulty of Paying Living Expenses: Not on file  Food Insecurity:    Worried About [place] in the Last Year: Not on file  Ran Out of Food in the Last Year: Not on file  Transportation Needs:    Lack of Transportation (Medical): Not on file   Lack of Transportation (Non-Medical): Not on file  Physical Activity:    Days of Exercise per Week: Not on file   Minutes of Exercise per Session: Not on file  Stress:    Feeling of Stress : Not on file  Social Connections:    Frequency of Communication with Friends and Family: Not on file   Frequency of Social Gatherings with Friends and Family: Not on file   Attends Religious Services: Not on file   Active Member of Clubs or Organizations: Not on file   Attends Archivist Meetings: Not on file   Marital Status: Not on file  Intimate Partner Violence:    Fear of Current or Ex-Partner: Not on file   Emotionally Abused: Not on file   Physically Abused: Not on file   Sexually Abused: Not on file     ROS:  General: Negative for anorexia, weight loss, fever, chills, fatigue, positive weakness. Eyes: Negative for vision changes.  ENT: Negative for hoarseness, difficulty swallowing , nasal congestion. CV: Negative for chest pain, angina, palpitations, positive dyspnea on exertion, positive peripheral edema.  Respiratory: Negative for dyspnea at rest, positive dyspnea on exertion, no cough, sputum, or wheezing.  GI: See history of present illness. GU:  Negative for dysuria, hematuria, urinary incontinence, urinary frequency, nocturnal urination.  MS: Negative for joint pain, low back pain.  Derm: Negative for rash or itching.  Neuro: Negative for weakness, abnormal sensation, seizure,  frequent headaches, memory loss, confusion.  Psych: Negative for anxiety, depression, suicidal ideation, hallucinations.  Endo: Negative for unusual weight change.  Heme: Negative for bruising or bleeding. Allergy: Negative for rash or hives.       Physical Examination: Vital signs in last 24 hours: Temp:  [98 F (36.7 C)-98.3 F (36.8 C)] 98.3 F (36.8 C) (02/11 1143) Pulse Rate:  [92-98] 95 (02/11 1143) Resp:  [15-20] 18 (02/11 1143) BP: (114-133)/(49-65) 133/52 (02/11 1143) SpO2:  [94 %-100 %] 99 % (02/11 1143) Weight:  [140.6 kg] 140.6 kg (02/11 0900)    General: Obese white male appears slightly short of breath at rest.  He is able to complete sentences. Head: Normocephalic, atraumatic.   Eyes: Conjunctiva pink, no icterus. Mouth: masked Neck: Supple without thyromegaly, masses, or lymphadenopathy.  Lungs: Diminished breath sounds but no audible wheezing, rhonchi  heart: Regular rate and rhythm, no murmurs rubs or gallops.  Abdomen: Bowel sounds are normal, nontender, nondistended, no hepatosplenomegaly or masses, no abdominal bruits or    hernia , no rebound or guarding.   Rectal: Not performed Extremities: 2+ pitting edema bilateral lower extremity edema, clubbing, deformity.  Neuro: Alert and oriented x 4 , grossly normal neurologically.  Skin: Warm and dry, no rash or jaundice.   Psych: Alert and cooperative, normal mood and affect.        Intake/Output from previous day: No intake/output data recorded. Intake/Output this shift: No intake/output data recorded.  Lab Results: CBC Recent Labs    10/26/19 0924  WBC 12.3*  HGB 5.4*  HCT 21.5*  MCV 73.6*  PLT 298   BMET Recent Labs    10/26/19 0924  NA 136  K 4.1  CL 103  CO2 28  GLUCOSE 119*  BUN 17  CREATININE 1.40*  CALCIUM 8.3*   LFT Recent Labs    10/26/19 0924  BILITOT 0.5  ALKPHOS 74  AST 12*  ALT 13  PROT 6.6  ALBUMIN 3.3*    Lipase No results for input(s): LIPASE in the last 72  hours.  PT/INR Recent Labs    10/26/19 1013  LABPROT 14.3  INR 1.1      Imaging Studies: No results found.Minnie.Brome week]   Impression: 69 year old gentleman with history of COPD, obesity, hypertension, diabetes presenting for symptomatic anemia.  Hemoglobin 5.4 on admission with evidence of both iron deficiency and folate deficiency.  He reports intermittent black stools for several months, denies bright red blood per rectum.  On digital rectal exam in the ED, stool was brown, heme positive.  Admits to frequent aspirin powder use.  Given reported melena, cannot exclude upper GI source for his iron deficiency anemia but I do not suspect acute upper GI bleed at this time given normal BUN and lack of melena on exam. Would be concerned about colonic source as well.  Chronic constipation.  Evaluate at time of colonoscopy.  With iron deficiency anemia, need to exclude malignancy.  Plan: Transfuse as needed. Clear liquid diet. Anticipate colonoscopy with upper endoscopy this admission, but acutely needs blood products and stabilization of his respiratory status.  Agree with PPI IV for now. Follow up pending covid results.  Folic acid 1 mg daily. For chronic constipation, try Linzess 198mcg daily.   We would like to thank you for the opportunity to participate in the care of Willis.  Laureen Ochs. Bernarda Caffey [facility] [phone] 2/11/20212:17 PM     LOS: 0 days   Attending note:  Agree with consult as outlined above.  Timing of EGD and colonoscopy while he is here to be determined.  He will be reassessed tomorrow morning.

## 2019-10-26 NOTE — ED Notes (Signed)
Date and time results received: 10/26/19 1004 (use smartphrase ".now" to insert current time)  Test: hemoglobin  Critical Value: 5.4  Name of Provider Notified: Dr Eulis Foster @1005   Orders Received? Or Actions Taken?: see new orders

## 2019-10-26 NOTE — ED Provider Notes (Signed)
[facility] Provider Note   CSN: [id number] Arrival date & time: 10/26/19  0849     History Chief Complaint  Patient presents with  . Abnormal Lab    Gregory Alvarado is a 69 y.o. male.  HPI He presents for evaluation of known anemia associated with dyspnea on exertion, and general fatigue.  He has seen black-colored stool on and off for 6 months.  No prior history of rectal bleeding.  He has a history of diverticulitis.  He denies fever, chills, chest pain, persistent shortness of breath, nausea, vomiting, anorexia, diarrhea or visible blood in the urine or stool.  There are no other known modifying factors.    Past Medical History:  Diagnosis Date  . Arthritis    left shoulder  . COPD (chronic obstructive pulmonary disease) ([place])   . Diabetes mellitus without complication ([place])   . Dyslipidemia   . Hypertension   . Insomnia   . Insomnia   . Morbid obesity ([place])   . Tobacco use     Patient Active Problem List   Diagnosis Date Noted  . Symptomatic anemia 10/26/2019  . Personal history of noncompliance with medical treatment, presenting hazards to health 02/24/2017  . Class 2 obesity due to excess calories with serious comorbidity and body mass index (BMI) of 35.0 to 35.9 in adult 01/05/2017  . Current smoker 01/05/2017  . Hypertension   . Type 2 diabetes mellitus with stage 3 chronic kidney disease, without long-term current use of insulin ([place])   . COPD (chronic obstructive pulmonary disease) ([place])   . Arthritis   . Hyperlipidemia   . Insomnia     Past Surgical History:  Procedure Laterality Date  . COLON RESECTION     DIVERTICULITIS  . HERNIA REPAIR     UHR  . ROTATOR CUFF REPAIR Bilateral   . TYMPANOPLASTY         Family History  Problem Relation Age of Onset  . Diabetes Mother   . Heart attack Mother   . Heart attack Father   . Heart attack Brother     Social History   Tobacco Use  . Smoking status: Current Every Day Smoker      Packs/day: 3.00    Types: Cigarettes  . Smokeless tobacco: Never Used  Substance Use Topics  . Alcohol use: No    Comment: rare  . Drug use: No    Home Medications Prior to Admission medications   Medication Sig Start Date End Date Taking? Authorizing Provider  albuterol (PROVENTIL HFA;VENTOLIN HFA) 108 (90 Base) MCG/ACT inhaler Inhale into the lungs every 6 (six) hours as needed for wheezing or shortness of breath.   Yes [provider]  aspirin EC 81 MG tablet Take 81 mg by mouth daily.   Yes [provider]  atorvastatin (LIPITOR) 40 MG tablet Take 40 mg by mouth at bedtime. 08/30/19  Yes [provider]  furosemide (LASIX) 40 MG tablet Take 40 mg by mouth every other day.  01/21/19  Yes [provider]  insulin NPH-regular Human (NOVOLIN 70/30) (70-30) 100 UNIT/ML injection Inject 30 Units into the skin 2 (two) times daily with a meal.   Yes [provider]  Insulin Pen Needle (B-D ULTRAFINE III SHORT PEN) 31G X 8 MM MISC 1 each by Does not apply route as directed. 01/05/17  Yes Nida, Marella Chimes, MD  INSULIN SYRINGE .5CC/29G 29G X 1/2" 0.5 ML MISC Use to inject insulin 2 times a  day 02/24/17  Yes Nida, Marella Chimes, MD  lisinopril (PRINIVIL,ZESTRIL) 20 MG tablet Take 1 tablet (20 mg total) by mouth daily. 06/09/17  Yes Nida, Marella Chimes, MD  traMADol (ULTRAM) 50 MG tablet Take 50 mg by mouth every 6 (six) hours as needed. 09/20/19  Yes [provider]  vitamin B-12 (CYANOCOBALAMIN) 250 MCG tablet Take 250 mcg by mouth daily.   Yes [provider]  zolpidem (AMBIEN) 10 MG tablet Take 5-10 mg by mouth at bedtime as needed for sleep.   Yes [provider]  ONE TOUCH ULTRA TEST test strip TEST FOUR TIMES DAILY AS DIRECTED. 05/21/17   Nida, Marella Chimes, MD    Allergies    Caduet [amlodipine-atorvastatin]  Review of Systems   Review of Systems  All other systems reviewed and are negative.   Physical  Exam Updated Vital Signs BP 128/65   Pulse 93   Temp 98.2 F (36.8 C) (Oral)   Resp 19   Wt (!) 140.6 kg   SpO2 98%   BMI 42.04 kg/m   Physical Exam Vitals and nursing note reviewed.  Constitutional:      General: He is not in acute distress.    Appearance: He is well-developed. He is obese. He is not ill-appearing, toxic-appearing or diaphoretic.  HENT:     Head: Normocephalic and atraumatic.     Right Ear: External ear normal.     Left Ear: External ear normal.     Mouth/Throat:     Mouth: Mucous membranes are moist.     Pharynx: No oropharyngeal exudate or posterior oropharyngeal erythema.  Eyes:     Conjunctiva/sclera: Conjunctivae normal.     Pupils: Pupils are equal, round, and reactive to light.  Neck:     Trachea: Phonation normal.  Cardiovascular:     Rate and Rhythm: Normal rate and regular rhythm.     Heart sounds: Normal heart sounds.  Pulmonary:     Effort: Pulmonary effort is normal.     Breath sounds: Normal breath sounds.  Abdominal:     General: There is no distension.     Palpations: Abdomen is soft.     Tenderness: There is no abdominal tenderness.  Genitourinary:    Comments: Brown stool in rectal vault.  No rectal mass.  Stool is heme positive. Musculoskeletal:        General: Normal range of motion.     Cervical back: Normal range of motion and neck supple.  Skin:    General: Skin is warm and dry.  Neurological:     Mental Status: He is alert and oriented to person, place, and time.     Cranial Nerves: No cranial nerve deficit.     Sensory: No sensory deficit.     Motor: No abnormal muscle tone.     Coordination: Coordination normal.  Psychiatric:        Mood and Affect: Mood normal.        Behavior: Behavior normal.        Thought Content: Thought content normal.        Judgment: Judgment normal.     ED Results / Procedures / Treatments   Labs (all labs ordered are listed, but only abnormal results are displayed) Labs Reviewed   COMPREHENSIVE METABOLIC PANEL - Abnormal; Notable for the following components:      Result Value   Glucose, Bld 119 (*)    Creatinine, Ser 1.40 (*)    Calcium 8.3 (*)  Albumin 3.3 (*)    AST 12 (*)    GFR calc non Af Amer 52 (*)    All other components within normal limits  CBC WITH DIFFERENTIAL/PLATELET - Abnormal; Notable for the following components:   WBC 12.3 (*)    RBC 2.92 (*)    Hemoglobin 5.4 (*)    HCT 21.5 (*)    MCV 73.6 (*)    MCH 18.5 (*)    MCHC 25.1 (*)    RDW 19.6 (*)    Neutro Abs 10.2 (*)    Abs Immature Granulocytes 0.13 (*)    All other components within normal limits  RETICULOCYTES - Abnormal; Notable for the following components:   Retic Ct Pct 3.6 (*)    RBC. 2.90 (*)    Immature Retic Fract 32.1 (*)    All other components within normal limits  POC OCCULT BLOOD, ED - Abnormal; Notable for the following components:   Fecal Occult Bld POSITIVE (*)    All other components within normal limits  PROTIME-INR  VITAMIN B12  FOLATE  IRON AND TIBC  FERRITIN  OCCULT BLOOD X 1 CARD TO LAB, STOOL  HEMOGLOBIN A1C  PREPARE RBC (CROSSMATCH)  TYPE AND SCREEN  ABO/RH    EKG EKG Interpretation  Date/Time:  Thursday October 26 2019 09:01:52 EST Ventricular Rate:  102 PR Interval:    QRS Duration: 96 QT Interval:  363 QTC Calculation: 473 R Axis:   86 Text Interpretation: Sinus tachycardia Borderline right axis deviation Nonspecific repol abnormality, diffuse leads Since last tracing ST abnormality is new Otherwise no significant change Confirmed by Daleen Bo [phone]) on 10/26/2019 9:06:59 AM   Radiology No results found.  Procedures .Critical Care Performed by: Daleen Bo, MD Authorized by: Daleen Bo, MD   Critical care provider statement:    Critical care time (minutes):  35   Critical care start time:  10/26/2019 9:01 AM   Critical care end time:  10/26/2019 11:26 AM   Critical care time was exclusive of:  Separately billable  procedures and treating other patients   Critical care was necessary to treat or prevent imminent or life-threatening deterioration of the following conditions:  Circulatory failure   Critical care was time spent personally by me on the following activities:  Blood draw for specimens, development of treatment plan with patient or surrogate, discussions with consultants, evaluation of patient's response to treatment, examination of patient, obtaining history from patient or surrogate, ordering and performing treatments and interventions, ordering and review of laboratory studies, pulse oximetry, re-evaluation of patient's condition, review of old charts and ordering and review of radiographic studies   (including critical care time)  Medications Ordered in ED Medications  insulin aspart (novoLOG) injection 0-5 Units (has no administration in time range)  insulin aspart (novoLOG) injection 0-15 Units (has no administration in time range)  pantoprazole (PROTONIX) injection 40 mg (has no administration in time range)  sodium chloride 0.9 % bolus 500 mL (0 mLs Intravenous Stopped 10/26/19 1136)  0.9 %  sodium chloride infusion (Manually program via Guardrails IV Fluids) ( Intravenous New Bag/Given 10/26/19 1136)    ED Course  I have reviewed the triage vital signs and the nursing notes.  Pertinent labs & imaging results that were available during my care of the patient were reviewed by me and considered in my medical decision making (see chart for details).  Clinical Course as of Oct 26 1135  Thu Oct 26, 2019  1055 Abnormal, blood present  POC occult blood, ED Provider will collect [EW]  1134 Case discussed with gastroenterology, consultation accepted by Dr. Gala Romney.   [EW]    Clinical Course User Index [EW] Daleen Bo, MD   MDM Rules/Calculators/A&P                       Patient Vitals for the past 24 hrs:  BP Temp Temp src Pulse Resp SpO2 Weight  10/26/19 1117 128/65 98.2 F (36.8  C) Oral 93 19 98 % --  10/26/19 1100 131/64 -- -- 93 19 94 % --  10/26/19 1030 (!) 129/49 -- -- 92 20 100 % --  10/26/19 1000 (!) 118/52 -- -- 94 19 98 % --  10/26/19 0930 (!) 114/56 -- -- 96 15 94 % --  10/26/19 0904 121/60 98 F (36.7 C) Oral 98 18 100 % --  10/26/19 0900 -- -- -- -- -- -- (!) 140.6 kg    10:32 AM Reevaluation with update and discussion. After initial assessment and treatment, an updated evaluation reveals he remains comfortable and has no further complaints.  Findings discussed, he agrees to transfusion, all questions answered. Daleen Bo   Medical Decision Making: Rectal bleeding, with significant anemia, causing symptoms.  Patient has significant shortness of breath and general weakness.  Doubt congestive heart failure, pneumonia or metabolic instability.  Blood pressure is reassuring.  Suspect all intestinal bleeding since stool is brown color, and he has normal.  He will require hospitalization for treatment with blood transfusion, and assessment by GI.  He is stable for floor admission.  Gregory Alvarado was evaluated in Emergency Department on 10/26/2019 for the symptoms described in the history of present illness. He was evaluated in the context of the global COVID-19 pandemic, which necessitated consideration that the patient might be at risk for infection with the SARS-CoV-2 virus that causes COVID-19. Institutional protocols and algorithms that pertain to the evaluation of patients at risk for COVID-19 are in a state of rapid change based on information released by regulatory bodies including the CDC and federal and state organizations. These policies and algorithms were followed during the patient's care in the ED.   CRITICAL CARE- yes Performed by: Daleen Bo   Nursing Notes Reviewed/ Care Coordinated Applicable Imaging Reviewed Interpretation of Laboratory Data incorporated into ED treatment   11:04 AM-Consult complete with hospitalist. Patient case  explained and discussed.  He agrees to admit patient for further evaluation and treatment. Call ended at 11:10 AM  Plan: admit   Final Clinical Impression(s) / ED Diagnoses Final diagnoses:  Rectal bleeding  Iron deficiency anemia, unspecified iron deficiency anemia type    Rx / DC Orders ED Discharge Orders    None       Daleen Bo, MD 10/26/19 1137

## 2019-10-26 NOTE — Progress Notes (Signed)
Patient refusing post-blood lasix.  Explained the importance of the medication to the patient and the patient still refused.  On-call MD  Notified via text page.  Will continue to monitor.

## 2019-10-26 NOTE — H&P (Signed)
Patient Demographics:    Gregory Alvarado, is a 69 y.o. male  MRN: [id number]   DOB - 07-Nov-1950  Admit Date - 10/26/2019  Outpatient Primary MD for the patient is Celene Squibb, MD   Assessment & Plan:    Principal Problem:   Symptomatic anemia Active Problems:   Hypertension   Type 2 diabetes mellitus with stage 3 chronic kidney disease, without long-term current use of insulin (HCC)   COPD (chronic obstructive pulmonary disease) ([place])   Current smoker   Personal history of noncompliance with medical treatment, presenting hazards to health   Obesity, Class III, BMI 40-49.9 (morbid obesity) ([place])    1) symptomatic possibly acute blood loss anemia--- admitted with fatigue, dizziness and shortness of breath in the setting of hemoglobin of 5.4 with previous baseline around 13 -Patient admits to [facility] powder use at least twice daily as well as intermittent dark stools and epigastric pain -IV Protonix as ordered -GI consult for endoluminal evaluation appreciated -Transfused 2 units of PRBC -Patient with hypochromic and microcytic anemia -Work-up reveals both iron and B12 as well as operative folate deficiency -Give IM B12 shot and replace folic acid -  2) GI bleed presumed acute--- manage as above #1 -Note that patient had segmental colon resection for complicated diverticulitis in the 1990s, he apparently had colonoscopy at that time, no endoluminal evaluation since then  3)DM2--A1c 6.2 reflecting excellent diabetic control PTA, hold scheduled insulin,Use Novolog/Humalog Sliding scale insulin with Accu-Cheks/Fingersticks as ordered   4)HTN-stable, continue lisinopril 20 mg daily, hold p.o. Lasix  5)HLD-stable, continue atorvastatin, hold aspirin  6) COPD/tobacco abuse--- patient smokes 2 packs/day, smoking  cessation advised, bronchodilators as ordered     With History of - Reviewed by me  Past Medical History:  Diagnosis Date  . Arthritis    left shoulder  . COPD (chronic obstructive pulmonary disease) ([place])   . Diabetes mellitus without complication ([place])   . Dyslipidemia   . Hypertension   . Insomnia   . Insomnia   . Morbid obesity ([place])   . Tobacco use       Past Surgical History:  Procedure Laterality Date  . COLON RESECTION  1990s?   DIVERTICULITIS  . HERNIA REPAIR     UHR  . ROTATOR CUFF REPAIR Bilateral   . TYMPANOPLASTY      Chief Complaint  Patient presents with  . Abnormal Lab      HPI:    Gregory Alvarado  is a 69 y.o. male smoker with past medical history relevant for DM2, HTN and morbid obesity who presents with complaints of fatigue, dyspnea and dyspnea on exertion as well and dizziness after being told by PCP to come to the ED due to low hemoglobin  -Patient reports using BC powders twice a day for the last 6 to 8 months, having intermittent dark stools as well as epigastric discomfort, -In the ED hemoglobin is 5.4 with low MCV and  low MCH, as well as low ferritin, low serum iron and low B12 and relatively low folate  -Denies hematochezia denies hematemesis denies emesis, no chest pains -No fevers or chills, no productive cough, no leg pains no leg swelling no pleuritic type symptoms -Note that patient had segmental colon resection for complicated diverticulitis in the 1990s, he apparently had colonoscopy at that time, no endoluminal evaluation since then  =-Stool occult blood is positive    Review of systems:    In addition to the HPI above,   A full Review of  Systems was done, all other systems reviewed are negative except as noted above in HPI , .    Social History:  Reviewed by me    Social History   Tobacco Use  . Smoking status: Current Every Day Smoker    Packs/day: 2.00    Types: Cigarettes  . Smokeless tobacco: Never Used    Substance Use Topics  . Alcohol use: No    Comment: rare     Family History :  Reviewed by me    Family History  Problem Relation Age of Onset  . Diabetes Mother   . Heart attack Mother   . Heart attack Father   . Heart attack Brother   . Colon cancer Neg Hx      Home Medications:   Prior to Admission medications   Medication Sig Start Date End Date Taking? Authorizing Provider  albuterol (PROVENTIL HFA;VENTOLIN HFA) 108 (90 Base) MCG/ACT inhaler Inhale into the lungs every 6 (six) hours as needed for wheezing or shortness of breath.   Yes [provider]  aspirin EC 81 MG tablet Take 81 mg by mouth daily.   Yes [provider]  atorvastatin (LIPITOR) 40 MG tablet Take 40 mg by mouth at bedtime. 08/30/19  Yes [provider]  furosemide (LASIX) 40 MG tablet Take 40 mg by mouth every other day.  01/21/19  Yes [provider]  insulin NPH-regular Human (NOVOLIN 70/30) (70-30) 100 UNIT/ML injection Inject 30 Units into the skin 2 (two) times daily with a meal.   Yes [provider]  Insulin Pen Needle (B-D ULTRAFINE III SHORT PEN) 31G X 8 MM MISC 1 each by Does not apply route as directed. 01/05/17  Yes Nida, Marella Chimes, MD  INSULIN SYRINGE .5CC/29G 29G X 1/2" 0.5 ML MISC Use to inject insulin 2 times a day 02/24/17  Yes Nida, Marella Chimes, MD  lisinopril (PRINIVIL,ZESTRIL) 20 MG tablet Take 1 tablet (20 mg total) by mouth daily. 06/09/17  Yes Nida, Marella Chimes, MD  traMADol (ULTRAM) 50 MG tablet Take 50 mg by mouth every 6 (six) hours as needed. 09/20/19  Yes [provider]  vitamin B-12 (CYANOCOBALAMIN) 250 MCG tablet Take 250 mcg by mouth daily.   Yes [provider]  zolpidem (AMBIEN) 10 MG tablet Take 5-10 mg by mouth at bedtime as needed for sleep.   Yes [provider]  ONE TOUCH ULTRA TEST test strip TEST FOUR TIMES DAILY AS DIRECTED. 05/21/17   Cassandria Anger, MD     Allergies:      Allergies  Allergen Reactions  . Caduet [Amlodipine-Atorvastatin]     swelling     Physical Exam:   Vitals  Blood pressure (!) 154/79, pulse 96, temperature 98.8 F (37.1 C), temperature source Oral, resp. rate 20, height 6' (1.829 m), weight (!) 139.8 kg, SpO2 98 %.  Physical Examination: General appearance - alert, obese appearing, and in  no distress  Mental status - alert, oriented to person, place, and time, Eyes - sclera anicteric Neck - supple, no JVD elevation , Chest - clear  to auscultation bilaterally, symmetrical air movement,  Heart - S1 and S2 normal, regular  Abdomen - soft, nontender, nondistended, healed lower abdominal prior laparotomy scar  neurological - screening mental status exam normal, neck supple without rigidity, cranial nerves II through XII intact, DTR's normal and symmetric Extremities - no pedal edema noted, intact peripheral pulses  Skin - warm, dry     Data Review:    CBC Recent Labs  Lab 10/26/19 0924  WBC 12.3*  HGB 5.4*  HCT 21.5*  PLT 298  MCV 73.6*  MCH 18.5*  MCHC 25.1*  RDW 19.6*  LYMPHSABS 0.9  MONOABS 0.8  EOSABS 0.2  BASOSABS 0.0   ------------------------------------------------------------------------------------------------------------------  Chemistries  Recent Labs  Lab 10/26/19 0924  NA 136  K 4.1  CL 103  CO2 28  GLUCOSE 119*  BUN 17  CREATININE 1.40*  CALCIUM 8.3*  AST 12*  ALT 13  ALKPHOS 74  BILITOT 0.5   ------------------------------------------------------------------------------------------------------------------ estimated creatinine clearance is 75.2 mL/min (A) (by C-G formula based on SCr of 1.4 mg/dL (H)). ------------------------------------------------------------------------------------------------------------------ No results for input(s): TSH, T4TOTAL, T3FREE, THYROIDAB in the last 72 hours.  Invalid input(s): FREET3   Coagulation profile Recent Labs  Lab 10/26/19 1013  INR  1.1   ------------------------------------------------------------------------------------------------------------------- No results for input(s): DDIMER in the last 72 hours. -------------------------------------------------------------------------------------------------------------------  Cardiac Enzymes No results for input(s): CKMB, TROPONINI, MYOGLOBIN in the last 168 hours.  Invalid input(s): CK ------------------------------------------------------------------------------------------------------------------ No results found for: BNP   ---------------------------------------------------------------------------------------------------------------  Urinalysis No results found for: COLORURINE, APPEARANCEUR, LABSPEC, [place], GLUCOSEU, HGBUR, BILIRUBINUR, KETONESUR, PROTEINUR, UROBILINOGEN, NITRITE, LEUKOCYTESUR  ----------------------------------------------------------------------------------------------------------------   Imaging Results:    No results found.  Radiological Exams on Admission: No results found.  DVT Prophylaxis -SCD (Gi bleed)  AM Labs Ordered, also please review Full Orders  Family Communication: Admission, patients condition and plan of care including tests being ordered have been discussed with the patient  who indicate understanding and agree with the plan   Code Status - Full Code  Likely DC to home after stabilization and transfusion and endoluminal evaluation  Condition   stable  Roxan Hockey M.D on 10/26/2019 at 6:39 PM Go to [email or web address] -  for contact info  [facility] - Office  [phone]

## 2019-10-26 NOTE — ED Notes (Signed)
Dr. Wentz at bedside. 

## 2019-10-26 NOTE — ED Notes (Signed)
Pt pacing in room and in hall at times.  Asked twice to not pace in hall.  Pt cooperated and when back in room.

## 2019-10-26 NOTE — Progress Notes (Signed)
Blood transfusion is started and pt is sitting up in bedside chair. Breathing has improved with positional change. This will be pt's second unit of the day. He shows no visible distress at this time and is talking on the phone with family. Pt confirms understanding of notifying staff if he has any adverse reactions to transfusion. Call bell is within reach.

## 2019-10-26 NOTE — Progress Notes (Signed)
Pt arrived on floor at approximately 1500. He is alert and oriented and shows no visible signs of distress. He is requesting something to drink. Call bell is within reach.

## 2019-10-26 NOTE — ED Triage Notes (Signed)
Pt reports that he was sent by Dr Nevada Crane due to low hemoglobin. Pt reports he has had black stools for 6 months

## 2019-10-27 DIAGNOSIS — Z833 Family history of diabetes mellitus: Secondary | ICD-10-CM | POA: Diagnosis not present

## 2019-10-27 DIAGNOSIS — K219 Gastro-esophageal reflux disease without esophagitis: Secondary | ICD-10-CM | POA: Diagnosis present

## 2019-10-27 DIAGNOSIS — K2980 Duodenitis without bleeding: Secondary | ICD-10-CM | POA: Diagnosis present

## 2019-10-27 DIAGNOSIS — Z9119 Patient's noncompliance with other medical treatment and regimen: Secondary | ICD-10-CM

## 2019-10-27 DIAGNOSIS — E1122 Type 2 diabetes mellitus with diabetic chronic kidney disease: Secondary | ICD-10-CM | POA: Diagnosis present

## 2019-10-27 DIAGNOSIS — J449 Chronic obstructive pulmonary disease, unspecified: Secondary | ICD-10-CM | POA: Diagnosis not present

## 2019-10-27 DIAGNOSIS — N183 Chronic kidney disease, stage 3 unspecified: Secondary | ICD-10-CM | POA: Diagnosis present

## 2019-10-27 DIAGNOSIS — I129 Hypertensive chronic kidney disease with stage 1 through stage 4 chronic kidney disease, or unspecified chronic kidney disease: Secondary | ICD-10-CM | POA: Diagnosis present

## 2019-10-27 DIAGNOSIS — Z20822 Contact with and (suspected) exposure to covid-19: Secondary | ICD-10-CM | POA: Diagnosis present

## 2019-10-27 DIAGNOSIS — J441 Chronic obstructive pulmonary disease with (acute) exacerbation: Secondary | ICD-10-CM | POA: Diagnosis present

## 2019-10-27 DIAGNOSIS — F17210 Nicotine dependence, cigarettes, uncomplicated: Secondary | ICD-10-CM | POA: Diagnosis present

## 2019-10-27 DIAGNOSIS — B9681 Helicobacter pylori [H. pylori] as the cause of diseases classified elsewhere: Secondary | ICD-10-CM | POA: Diagnosis present

## 2019-10-27 DIAGNOSIS — K625 Hemorrhage of anus and rectum: Secondary | ICD-10-CM

## 2019-10-27 DIAGNOSIS — F17200 Nicotine dependence, unspecified, uncomplicated: Secondary | ICD-10-CM | POA: Diagnosis not present

## 2019-10-27 DIAGNOSIS — M19012 Primary osteoarthritis, left shoulder: Secondary | ICD-10-CM | POA: Diagnosis present

## 2019-10-27 DIAGNOSIS — K2970 Gastritis, unspecified, without bleeding: Secondary | ICD-10-CM | POA: Diagnosis present

## 2019-10-27 DIAGNOSIS — Q394 Esophageal web: Secondary | ICD-10-CM | POA: Diagnosis not present

## 2019-10-27 DIAGNOSIS — D509 Iron deficiency anemia, unspecified: Secondary | ICD-10-CM

## 2019-10-27 DIAGNOSIS — E785 Hyperlipidemia, unspecified: Secondary | ICD-10-CM | POA: Diagnosis present

## 2019-10-27 DIAGNOSIS — Z888 Allergy status to other drugs, medicaments and biological substances status: Secondary | ICD-10-CM | POA: Diagnosis not present

## 2019-10-27 DIAGNOSIS — K922 Gastrointestinal hemorrhage, unspecified: Secondary | ICD-10-CM | POA: Diagnosis present

## 2019-10-27 DIAGNOSIS — K2951 Unspecified chronic gastritis with bleeding: Secondary | ICD-10-CM | POA: Diagnosis present

## 2019-10-27 DIAGNOSIS — Z6841 Body Mass Index (BMI) 40.0 and over, adult: Secondary | ICD-10-CM | POA: Diagnosis not present

## 2019-10-27 DIAGNOSIS — E538 Deficiency of other specified B group vitamins: Secondary | ICD-10-CM | POA: Diagnosis present

## 2019-10-27 DIAGNOSIS — Z8249 Family history of ischemic heart disease and other diseases of the circulatory system: Secondary | ICD-10-CM | POA: Diagnosis not present

## 2019-10-27 DIAGNOSIS — Z7982 Long term (current) use of aspirin: Secondary | ICD-10-CM | POA: Diagnosis not present

## 2019-10-27 DIAGNOSIS — D649 Anemia, unspecified: Secondary | ICD-10-CM | POA: Diagnosis not present

## 2019-10-27 DIAGNOSIS — D62 Acute posthemorrhagic anemia: Secondary | ICD-10-CM | POA: Diagnosis present

## 2019-10-27 DIAGNOSIS — Z794 Long term (current) use of insulin: Secondary | ICD-10-CM | POA: Diagnosis not present

## 2019-10-27 LAB — CBC
HCT: 27.2 % — ABNORMAL LOW (ref 39.0–52.0)
Hemoglobin: 7.4 g/dL — ABNORMAL LOW (ref 13.0–17.0)
MCH: 20.6 pg — ABNORMAL LOW (ref 26.0–34.0)
MCHC: 27.2 g/dL — ABNORMAL LOW (ref 30.0–36.0)
MCV: 75.8 fL — ABNORMAL LOW (ref 80.0–100.0)
Platelets: 330 10*3/uL (ref 150–400)
RBC: 3.59 MIL/uL — ABNORMAL LOW (ref 4.22–5.81)
RDW: 20.1 % — ABNORMAL HIGH (ref 11.5–15.5)
WBC: 12.7 10*3/uL — ABNORMAL HIGH (ref 4.0–10.5)
nRBC: 0.3 % — ABNORMAL HIGH (ref 0.0–0.2)

## 2019-10-27 LAB — BASIC METABOLIC PANEL
Anion gap: 11 (ref 5–15)
BUN: 15 mg/dL (ref 8–23)
CO2: 27 mmol/L (ref 22–32)
Calcium: 9 mg/dL (ref 8.9–10.3)
Chloride: 100 mmol/L (ref 98–111)
Creatinine, Ser: 1.47 mg/dL — ABNORMAL HIGH (ref 0.61–1.24)
GFR calc Af Amer: 57 mL/min — ABNORMAL LOW (ref >60)
GFR calc non Af Amer: 49 mL/min — ABNORMAL LOW (ref >60)
Glucose, Bld: 109 mg/dL — ABNORMAL HIGH (ref 70–99)
Potassium: 4.6 mmol/L (ref 3.5–5.1)
Sodium: 138 mmol/L (ref 135–145)

## 2019-10-27 LAB — GLUCOSE, CAPILLARY
Glucose-Capillary: 108 mg/dL — ABNORMAL HIGH (ref 70–99)
Glucose-Capillary: 149 mg/dL — ABNORMAL HIGH (ref 70–99)
Glucose-Capillary: 231 mg/dL — ABNORMAL HIGH (ref 70–99)
Glucose-Capillary: 265 mg/dL — ABNORMAL HIGH (ref 70–99)

## 2019-10-27 LAB — PREPARE RBC (CROSSMATCH)

## 2019-10-27 MED ORDER — METHOCARBAMOL 500 MG PO TABS
750.0000 mg | ORAL_TABLET | Freq: Three times a day (TID) | ORAL | Status: DC
  Administered 2019-10-27 – 2019-10-29 (×6): 750 mg via ORAL
  Filled 2019-10-27 (×6): qty 2

## 2019-10-27 MED ORDER — IPRATROPIUM-ALBUTEROL 0.5-2.5 (3) MG/3ML IN SOLN
3.0000 mL | Freq: Four times a day (QID) | RESPIRATORY_TRACT | Status: DC
  Administered 2019-10-27 – 2019-10-29 (×8): 3 mL via RESPIRATORY_TRACT
  Filled 2019-10-27 (×8): qty 3

## 2019-10-27 MED ORDER — POTASSIUM CHLORIDE CRYS ER 20 MEQ PO TBCR
40.0000 meq | EXTENDED_RELEASE_TABLET | Freq: Once | ORAL | Status: AC
  Administered 2019-10-27: 40 meq via ORAL
  Filled 2019-10-27: qty 2

## 2019-10-27 MED ORDER — METHYLPREDNISOLONE SODIUM SUCC 40 MG IJ SOLR
40.0000 mg | Freq: Three times a day (TID) | INTRAMUSCULAR | Status: DC
  Administered 2019-10-27 – 2019-10-28 (×4): 40 mg via INTRAVENOUS
  Filled 2019-10-27 (×4): qty 1

## 2019-10-27 MED ORDER — GUAIFENESIN ER 600 MG PO TB12
600.0000 mg | ORAL_TABLET | Freq: Two times a day (BID) | ORAL | Status: DC
  Administered 2019-10-27 – 2019-10-29 (×5): 600 mg via ORAL
  Filled 2019-10-27 (×5): qty 1

## 2019-10-27 MED ORDER — TRAZODONE HCL 50 MG PO TABS
150.0000 mg | ORAL_TABLET | Freq: Every day | ORAL | Status: DC
  Administered 2019-10-27 – 2019-10-28 (×2): 150 mg via ORAL
  Filled 2019-10-27 (×2): qty 3

## 2019-10-27 MED ORDER — FUROSEMIDE 10 MG/ML IJ SOLN: 40.0000 mg | Freq: Once | INTRAMUSCULAR | Status: DC

## 2019-10-27 MED ORDER — NICOTINE 21 MG/24HR TD PT24
21.0000 mg | MEDICATED_PATCH | Freq: Every day | TRANSDERMAL | Status: DC
  Administered 2019-10-27 – 2019-10-29 (×3): 21 mg via TRANSDERMAL
  Filled 2019-10-27 (×3): qty 1

## 2019-10-27 MED ORDER — FUROSEMIDE 10 MG/ML IJ SOLN
40.0000 mg | Freq: Once | INTRAMUSCULAR | Status: AC
  Administered 2019-10-27: 40 mg via INTRAVENOUS
  Filled 2019-10-27: qty 4

## 2019-10-27 MED ORDER — SODIUM CHLORIDE 0.9% IV SOLUTION: Freq: Once | INTRAVENOUS | Status: DC

## 2019-10-27 NOTE — Progress Notes (Signed)
Subjective: Still with breathing issues. 2 ppd smoker since 69 years old. Refused Lasix and understands endoscopy cannot be completed until his respiratory status is improved. Has had 2 colonoscopies "many years ago" (>10 years). Admits prolonged melena, denies overt hematochezia. Takes about 4 [place]y powders a day. Occasional GERD symptoms, occasional/rare solid food dysphagia (every 2-3 months, no regurgitation). Denies ongoing abdominal pain, N/V. No other GI complaints this morning. Had a good bowel movement this morning with linzess.  Objective: Vital signs in last 24 hours: Temp:  [97.7 F (36.5 C)-99.3 F (37.4 C)] 99.3 F (37.4 C) (02/12 0610) Pulse Rate:  [88-108] 108 (02/12 0610) Resp:  [15-22] 18 (02/12 0610) BP: (100-159)/(49-79) 146/61 (02/12 0610) SpO2:  [92 %-100 %] 96 % (02/12 0610) Weight:  [139.8 kg] 139.8 kg (02/11 1546) Last BM Date: 10/26/19 General:   Alert and oriented, pleasant Head:  Normocephalic and atraumatic. Eyes:  No icterus, sclera clear. Conjuctiva pink.  Heart:  S1, S2 present, no murmurs noted.  Lungs: Bilateral wheezing and mild rales. Appears somewhat dyspneic.  Abdomen:  Bowel sounds present, soft, non-tender, non-distended. No HSM or hernias noted. No rebound or guarding. No masses appreciated  Msk:  Symmetrical without gross deformities. Pulses:  Normal pulses noted. Extremities:  Without clubbing. Mild bilateral non-pitting LE edema. Neurologic:  Alert and  oriented x4;  grossly normal neurologically. Psych:  Alert and cooperative. Normal mood and affect.  Intake/Output from previous day: 02/11 0701 - 02/12 0700 In: 250 [I.V.:250] Out: -  Intake/Output this shift: No intake/output data recorded.  Lab Results: Recent Labs    10/26/19 0924 10/27/19 0552  WBC 12.3* 12.7*  HGB 5.4* 7.4*  HCT 21.5* 27.2*  PLT 298 330   BMET Recent Labs    10/26/19 0924 10/27/19 0552  NA 136 138  K 4.1 4.6  CL 103 100  CO2 28 27  GLUCOSE  119* 109*  BUN 17 15  CREATININE 1.40* 1.47*  CALCIUM 8.3* 9.0   LFT Recent Labs    10/26/19 0924  PROT 6.6  ALBUMIN 3.3*  AST 12*  ALT 13  ALKPHOS 74  BILITOT 0.5   PT/INR Recent Labs    10/26/19 1013  LABPROT 14.3  INR 1.1   Hepatitis Panel No results for input(s): HEPBSAG, HCVAB, HEPAIGM, HEPBIGM in the last 72 hours.   Studies/Results: No results found.  Assessment: 69 year old gentleman with history of COPD, obesity, hypertension, diabetes presenting for symptomatic anemia.  Hemoglobin 5.4 on admission with evidence of both iron deficiency and folate deficiency.  He reported intermittent black stools for several months, denied bright red blood per rectum.  On digital rectal exam in the ED, stool was brown, heme positive.  Admits to frequent aspirin powder use.  Given reported melena, cannot exclude upper GI source for his iron deficiency anemia but I do not suspect acute upper GI bleed at this time given normal BUN and lack of melena on exam. Would be concerned about colonic source as well. Remains on IV PPI for now. COVID-19 resulted negative.  Chronic constipation.  Evaluate at time of colonoscopy.  With iron deficiency anemia, need to exclude malignancy. Started on Linzess 145 mcg (has worked well for him thus far)  Anemia: Hgb yesterday 5.6, improved today to 7.6 with 2 units PRBC. Refused post-transfusion lasix. Folate low (being replaced as per above), ferritin 4 and iron sat 2% representing likely chronic losses. Will eventually need colonoscopy and EGD to evaluate when respiratory status improved.  Respiratory difficulty: Per nursing, respiratory status improved yesterday after transfusion. Was a bit dyspneic yesterday with history of COPD, obesity in the setting of acute anemia. Today he is still dyspneic long-standing heavy smoker. Discussed need for improved respiratory status for endoscopic evaluation. Previously refused Lasix, hospitalist ordered steroid and  lasix with frank discussion about need to follow medical advice for improvement in clinical scenario.    Plan: 1. Continue IV PPI 2. Will discuss timing of colonoscopy/EGD with Dr. Oneida Alar (has not prepped yet) 3. Remains on clear liquids, will change to full liquids today 4. NPO after midnight 5. Reassess hgb and respiratory status in the morning   Thank you for allowing [place] to participate in the care of Gregory Alvarado  Walden Field, DNP, AGNP-C Adult & Gerontological Nurse Practitioner [facility]     LOS: 0 days    10/27/2019, 9:02 AM

## 2019-10-27 NOTE — Progress Notes (Signed)
Patient Demographics:    Gregory Alvarado, is a 69 y.o. male, DOB - 1951/08/08, [id number]  Admit date - 10/26/2019   Admitting Physician Michelene Keniston Denton Brick, MD  Outpatient Primary MD for the patient is Celene Squibb, MD  LOS - 0   Chief Complaint  Patient presents with  . Abnormal Lab        Subjective:    Gregory Alvarado today has no fevers, no emesis,  No chest pain --has cough, wheezing and dyspnea -expiratory wheezes audible from the doorway   Assessment  & Plan :    Principal Problem:   Symptomatic anemia Active Problems:   Hypertension   Type 2 diabetes mellitus with stage 3 chronic kidney disease, without long-term current use of insulin (HCC)   COPD (chronic obstructive pulmonary disease) (HCC)   Current smoker   Personal history of noncompliance with medical treatment, presenting hazards to health   Obesity, Class III, BMI 40-49.9 (morbid obesity) ([place])   Rectal bleeding  Brief Summary:- 69 y.o. male smoker with past medical history relevant for DM2, HTN and morbid obesity admitted on 10/25/2018 with symptomatic anemia with hemoglobin down to 5.4 in the setting of twice daily BC powder use  A/p symptomatic possibly acute blood loss anemia--- admitted with fatigue, dizziness and shortness of breath in the setting of hemoglobin of 5.4 with previous baseline around 13 -Hemoglobin up to 7.4 after transfusion of 2 units of packed red blood cell -Transfuse additional unit of packed cells with Lasix -Patient admits to [facility] powder use at least twice daily as well as intermittent dark stools and epigastric pain for over 6 months -Continue IV Protonix  -GI consult for endoluminal evaluation appreciated --Patient with hypochromic and microcytic anemia -Work-up reveals both iron and B12 as well as operative folate deficiency -Patient received IM B12 shot and replace folic acid --Plan is possibly for  EGD on 10/28/2019  2) GI bleed presumed acute--- manage as above #1 -Note that patient had segmental colon resection for complicated diverticulitis in the 1990s, he apparently had colonoscopy at that time, no endoluminal evaluation since then  3)DM2--A1c 6.2 reflecting excellent diabetic control PTA, hold scheduled insulin,Use Novolog/Humalog Sliding scale insulin with Accu-Cheks/Fingersticks as ordered   4)HTN-stable, continue lisinopril 20 mg daily, hold p.o. Lasix  5)HLD-stable, continue atorvastatin, hold aspirin  6) acute COPD exacerbation-patient very wheezy, short of breath and cough--- IV Solu-Medrol, bronchodilators as ordered -expiratory wheezes audible from the doorway   7)Tobacco Abuse--- patient smokes 2 packs/day, smoking cessation advised, nicotine patch given  Disposition/Need for in-Hospital Stay- patient unable to be discharged at this time due to --- symptomatic anemia, -We will transfuse additional unit of packed cells, will optimize respiratory status to allow for EGD with sedation on 10/28/2019  Code Status : Full  Family Communication:   NA (patient is alert, awake and coherent)  Consults  :  Gi  DVT Prophylaxis  :  Gi bleed- SCDs   Lab Results  Component Value Date   PLT 330 10/27/2019    Inpatient Medications  Scheduled Meds: . atorvastatin  40 mg Oral QHS  . folic acid  1 mg Oral Daily  . guaiFENesin  600 mg Oral BID  . insulin aspart  0-15 Units Subcutaneous TID  WC  . insulin aspart  0-5 Units Subcutaneous QHS  . ipratropium-albuterol  3 mL Nebulization QID  . linaclotide  145 mcg Oral QAC breakfast  . lisinopril  20 mg Oral Daily  . methocarbamol  750 mg Oral TID  . methylPREDNISolone (SOLU-MEDROL) injection  40 mg Intravenous Q8H  . nicotine  21 mg Transdermal Daily  . pantoprazole (PROTONIX) IV  40 mg Intravenous Q12H  . sodium chloride flush  3 mL Intravenous Q12H  . vitamin B-12  250 mcg Oral Daily   Continuous Infusions: . sodium  chloride     PRN Meds:.sodium chloride, acetaminophen **OR** acetaminophen, albuterol, ondansetron **OR** ondansetron (ZOFRAN) IV, polyethylene glycol, sodium chloride flush, traZODone, zolpidem   Anti-infectives (From admission, onward)   None        Objective:   Vitals:   10/27/19 0610 10/27/19 1204 10/27/19 1409 10/27/19 1606  BP: (!) 146/61  (!) 153/79   Pulse: (!) 108  98   Resp: 18     Temp: 99.3 F (37.4 C)  98.4 F (36.9 C)   TempSrc: Oral  Oral   SpO2: 96% 94% 96% 94%  Weight:      Height:        Wt Readings from Last 3 Encounters:  10/26/19 (!) 139.8 kg  01/27/19 136.1 kg  10/13/18 (!) 138.8 kg     Intake/Output Summary (Last 24 hours) at 10/27/2019 1754 Last data filed at 10/27/2019 1531 Gross per 24 hour  Intake 1210 ml  Output -  Net 1210 ml     Physical Exam  Gen:- Awake Alert,  In no apparent distress  HEENT:- Mark.AT, No sclera icterus Neck-Supple Neck,No JVD,.  Lungs-diminished breath sounds, expiratory wheezes audible from the doorway CV- S1, S2 normal, regular  Abd-  +ve B.Sounds, Abd Soft, epigastric discomfort without rebound, increased truncal adiposity    Extremity/Skin:- No  edema, pedal pulses present  Psych-affect is appropriate, oriented x3 Neuro-no new focal deficits, no tremors   Data Review:   Micro Results Recent Results (from the past 240 hour(s))  SARS CORONAVIRUS 2 (TAT 6-24 HRS) Nasopharyngeal Nasopharyngeal Swab     Status: None   Collection Time: 10/26/19 11:59 AM   Specimen: Nasopharyngeal Swab  Result Value Ref Range Status   SARS Coronavirus 2 NEGATIVE NEGATIVE Final    Comment: (NOTE) SARS-CoV-2 target nucleic acids are NOT DETECTED. The SARS-CoV-2 RNA is generally detectable in upper and lower respiratory specimens during the acute phase of infection. Negative results do not preclude SARS-CoV-2 infection, do not rule out co-infections with other pathogens, and should not be used as the sole basis for treatment  or other patient management decisions. Negative results must be combined with clinical observations, patient history, and epidemiological information. The expected result is Negative. Fact Sheet for Patients: SugarRoll.be Fact Sheet for Healthcare Providers: https://www.woods-mathews.com/ This test is not yet approved or cleared by the [place] FDA and  has been authorized for detection and/or diagnosis of SARS-CoV-2 by FDA under an Emergency Use Authorization (EUA). This EUA will remain  in effect (meaning this test can be used) for the duration of the COVID-19 declaration under Section 56 4(b)(1) of the Act, 21 U.S.C. section 360bbb-3(b)(1), unless the authorization is terminated or revoked sooner. Performed at [facility], [place] [address] [place], [place] [address]    Radiology Reports No results found.   CBC Recent Labs  Lab 10/26/19 0924 10/27/19 0552  WBC 12.3* 12.7*  HGB 5.4* 7.4*  HCT  21.5* 27.2*  PLT 298 330  MCV 73.6* 75.8*  MCH 18.5* 20.6*  MCHC 25.1* 27.2*  RDW 19.6* 20.1*  LYMPHSABS 0.9  --   MONOABS 0.8  --   EOSABS 0.2  --   BASOSABS 0.0  --     Chemistries  Recent Labs  Lab 10/26/19 0924 10/27/19 0552  NA 136 138  K 4.1 4.6  CL 103 100  CO2 28 27  GLUCOSE 119* 109*  BUN 17 15  CREATININE 1.40* 1.47*  CALCIUM 8.3* 9.0  AST 12*  --   ALT 13  --   ALKPHOS 74  --   BILITOT 0.5  --    ------------------------------------------------------------------------------------------------------------------ No results for input(s): CHOL, HDL, LDLCALC, TRIG, CHOLHDL, LDLDIRECT in the last 72 hours.  Lab Results  Component Value Date   HGBA1C 6.2 (H) 10/26/2019   ------------------------------------------------------------------------------------------------------------------ No results for input(s): TSH, T4TOTAL, T3FREE, THYROIDAB in the last 72 hours.  Invalid input(s): FREET3  ------------------------------------------------------------------------------------------------------------------ Recent Labs    10/26/19 1105 10/26/19 1128  VITAMINB12 224  --   FOLATE 3.7*  --   FERRITIN 4*  --   TIBC 516*  --   IRON 12*  --   RETICCTPCT  --  3.6*    Coagulation profile Recent Labs  Lab 10/26/19 1013  INR 1.1    No results for input(s): DDIMER in the last 72 hours.  Cardiac Enzymes No results for input(s): CKMB, TROPONINI, MYOGLOBIN in the last 168 hours.  Invalid input(s): CK ------------------------------------------------------------------------------------------------------------------ No results found for: BNP   Roxan Hockey M.D on 10/27/2019 at 5:54 PM  Go to [email or web address] - for contact info  [facility] - Office  [phone]

## 2019-10-27 NOTE — Care Management Obs Status (Signed)
[facility] NOTIFICATION   Patient Details  Name: Gregory Alvarado MRN: [id number] Date of Birth: 05-Dec-1950   Medicare Observation Status Notification Given:  Yes    [name] 10/27/2019, 3:09 PM

## 2019-10-27 NOTE — Progress Notes (Signed)
Patient has seemed to improve since his last round of medications. His breathing sounds much better. He has tolerated his medications well. I will continue to monitor throughout the shift.

## 2019-10-27 NOTE — H&P (View-Only) (Signed)
Subjective: Still with breathing issues. 2 ppd smoker since 69 years old. Refused Lasix and understands endoscopy cannot be completed until his respiratory status is improved. Has had 2 colonoscopies "many years ago" (>10 years). Admits prolonged melena, denies overt hematochezia. Takes about 4 Goody powders a day. Occasional GERD symptoms, occasional/rare solid food dysphagia (every 2-3 months, no regurgitation). Denies ongoing abdominal pain, N/V. No other GI complaints this morning. Had a good bowel movement this morning with linzess.  Objective: Vital signs in last 24 hours: Temp:  [97.7 F (36.5 C)-99.3 F (37.4 C)] 99.3 F (37.4 C) (02/12 0610) Pulse Rate:  [88-108] 108 (02/12 0610) Resp:  [15-22] 18 (02/12 0610) BP: (100-159)/(49-79) 146/61 (02/12 0610) SpO2:  [92 %-100 %] 96 % (02/12 0610) Weight:  [139.8 kg] 139.8 kg (02/11 1546) Last BM Date: 10/26/19 General:   Alert and oriented, pleasant Head:  Normocephalic and atraumatic. Eyes:  No icterus, sclera clear. Conjuctiva pink.  Heart:  S1, S2 present, no murmurs noted.  Lungs: Bilateral wheezing and mild rales. Appears somewhat dyspneic.  Abdomen:  Bowel sounds present, soft, non-tender, non-distended. No HSM or hernias noted. No rebound or guarding. No masses appreciated  Msk:  Symmetrical without gross deformities. Pulses:  Normal pulses noted. Extremities:  Without clubbing. Mild bilateral non-pitting LE edema. Neurologic:  Alert and  oriented x4;  grossly normal neurologically. Psych:  Alert and cooperative. Normal mood and affect.  Intake/Output from previous day: 02/11 0701 - 02/12 0700 In: 250 [I.V.:250] Out: -  Intake/Output this shift: No intake/output data recorded.  Lab Results: Recent Labs    10/26/19 0924 10/27/19 0552  WBC 12.3* 12.7*  HGB 5.4* 7.4*  HCT 21.5* 27.2*  PLT 298 330   BMET Recent Labs    10/26/19 0924 10/27/19 0552  NA 136 138  K 4.1 4.6  CL 103 100  CO2 28 27  GLUCOSE  119* 109*  BUN 17 15  CREATININE 1.40* 1.47*  CALCIUM 8.3* 9.0   LFT Recent Labs    10/26/19 0924  PROT 6.6  ALBUMIN 3.3*  AST 12*  ALT 13  ALKPHOS 74  BILITOT 0.5   PT/INR Recent Labs    10/26/19 1013  LABPROT 14.3  INR 1.1   Hepatitis Panel No results for input(s): HEPBSAG, HCVAB, HEPAIGM, HEPBIGM in the last 72 hours.   Studies/Results: No results found.  Assessment: 69 year old gentleman with history of COPD, obesity, hypertension, diabetes presenting for symptomatic anemia.  Hemoglobin 5.4 on admission with evidence of both iron deficiency and folate deficiency.  He reported intermittent black stools for several months, denied bright red blood per rectum.  On digital rectal exam in the ED, stool was brown, heme positive.  Admits to frequent aspirin powder use.  Given reported melena, cannot exclude upper GI source for his iron deficiency anemia but I do not suspect acute upper GI bleed at this time given normal BUN and lack of melena on exam. Would be concerned about colonic source as well. Remains on IV PPI for now. COVID-19 resulted negative.  Chronic constipation.  Evaluate at time of colonoscopy.  With iron deficiency anemia, need to exclude malignancy. Started on Linzess 145 mcg (has worked well for him thus far)  Anemia: Hgb yesterday 5.6, improved today to 7.6 with 2 units PRBC. Refused post-transfusion lasix. Folate low (being replaced as per above), ferritin 4 and iron sat 2% representing likely chronic losses. Will eventually need colonoscopy and EGD to evaluate when respiratory status improved.  Respiratory difficulty: Per nursing, respiratory status improved yesterday after transfusion. Was a bit dyspneic yesterday with history of COPD, obesity in the setting of acute anemia. Today he is still dyspneic long-standing heavy smoker. Discussed need for improved respiratory status for endoscopic evaluation. Previously refused Lasix, hospitalist ordered steroid and  lasix with frank discussion about need to follow medical advice for improvement in clinical scenario.    Plan: 1. Continue IV PPI 2. Will discuss timing of colonoscopy/EGD with Dr. Oneida Alar (has not prepped yet) 3. Remains on clear liquids, will change to full liquids today 4. NPO after midnight 5. Reassess hgb and respiratory status in the morning   Thank you for allowing [place] to participate in the care of Gregory Alvarado  Walden Field, DNP, AGNP-C Adult & Gerontological Nurse Practitioner [facility]     LOS: 0 days    10/27/2019, 9:02 AM

## 2019-10-27 NOTE — Progress Notes (Signed)
Patient is alert and oriented this morning, he was noncompliant about taking his medications at first but changed his mind. He tolerated medications with no complications. Will continue to monitor patient for the duration of the shift.

## 2019-10-28 ENCOUNTER — Encounter (HOSPITAL_COMMUNITY)
Admission: EM | Disposition: A | Discharge status: Home / Self Care | Payer: Self-pay | Source: Home / Self Care | Attending: Family Medicine

## 2019-10-28 ENCOUNTER — Inpatient Hospital Stay (HOSPITAL_COMMUNITY): Payer: Medicare Other | Admitting: Anesthesiology

## 2019-10-28 HISTORY — PX: BIOPSY: SHX5522

## 2019-10-28 HISTORY — PX: ESOPHAGOGASTRODUODENOSCOPY (EGD) WITH PROPOFOL: SHX5813

## 2019-10-28 LAB — TYPE AND SCREEN
ABO/RH(D): A POS
Antibody Screen: NEGATIVE
Unit division: 0
Unit division: 0
Unit division: 0

## 2019-10-28 LAB — BASIC METABOLIC PANEL WITH GFR
Anion gap: 12 (ref 5–15)
BUN: 23 mg/dL (ref 8–23)
CO2: 25 mmol/L (ref 22–32)
Calcium: 9 mg/dL (ref 8.9–10.3)
Chloride: 96 mmol/L — ABNORMAL LOW (ref 98–111)
Creatinine, Ser: 1.41 mg/dL — ABNORMAL HIGH (ref 0.61–1.24)
GFR calc Af Amer: 60 mL/min — ABNORMAL LOW
GFR calc non Af Amer: 52 mL/min — ABNORMAL LOW
Glucose, Bld: 250 mg/dL — ABNORMAL HIGH (ref 70–99)
Potassium: 4.9 mmol/L (ref 3.5–5.1)
Sodium: 133 mmol/L — ABNORMAL LOW (ref 135–145)

## 2019-10-28 LAB — GLUCOSE, CAPILLARY
Glucose-Capillary: 250 mg/dL — ABNORMAL HIGH (ref 70–99)
Glucose-Capillary: 245 mg/dL — ABNORMAL HIGH (ref 70–99)
Glucose-Capillary: 225 mg/dL — ABNORMAL HIGH (ref 70–99)
Glucose-Capillary: 276 mg/dL — ABNORMAL HIGH (ref 70–99)
Glucose-Capillary: 211 mg/dL — ABNORMAL HIGH (ref 70–99)

## 2019-10-28 LAB — CBC
HCT: 30.2 % — ABNORMAL LOW (ref 39.0–52.0)
Hemoglobin: 8.4 g/dL — ABNORMAL LOW (ref 13.0–17.0)
MCH: 21.4 pg — ABNORMAL LOW (ref 26.0–34.0)
MCHC: 27.8 g/dL — ABNORMAL LOW (ref 30.0–36.0)
MCV: 76.8 fL — ABNORMAL LOW (ref 80.0–100.0)
Platelets: 354 10*3/uL (ref 150–400)
RBC: 3.93 MIL/uL — ABNORMAL LOW (ref 4.22–5.81)
RDW: 21.4 % — ABNORMAL HIGH (ref 11.5–15.5)
WBC: 13 10*3/uL — ABNORMAL HIGH (ref 4.0–10.5)
nRBC: 0 % (ref 0.0–0.2)

## 2019-10-28 LAB — BPAM RBC
Blood Product Expiration Date: 202102112359
Blood Product Expiration Date: 202102242359
Blood Product Expiration Date: 202102282359
ISSUE DATE / TIME: 202102111124
ISSUE DATE / TIME: 202102111811
ISSUE DATE / TIME: 202102121845
Unit Type and Rh: 600
Unit Type and Rh: 6200
Unit Type and Rh: 6200

## 2019-10-28 SURGERY — ESOPHAGOGASTRODUODENOSCOPY (EGD) WITH PROPOFOL
Anesthesia: General

## 2019-10-28 MED ORDER — LACTATED RINGERS IV SOLN: INTRAVENOUS | Status: DC | PRN

## 2019-10-28 MED ORDER — PROPOFOL 10 MG/ML IV BOLUS
INTRAVENOUS | Status: AC
  Filled 2019-10-28: qty 40

## 2019-10-28 MED ORDER — PROPOFOL 500 MG/50ML IV EMUL: INTRAVENOUS | Status: DC | PRN

## 2019-10-28 MED ORDER — ONDANSETRON HCL 4 MG/2ML IJ SOLN
INTRAMUSCULAR | Status: DC | PRN
  Administered 2019-10-28: 4 mg via INTRAVENOUS

## 2019-10-28 MED ORDER — GLYCOPYRROLATE PF 0.2 MG/ML IJ SOSY
PREFILLED_SYRINGE | INTRAMUSCULAR | Status: AC
  Filled 2019-10-28: qty 1

## 2019-10-28 MED ORDER — LIDOCAINE 2% (20 MG/ML) 5 ML SYRINGE
INTRAMUSCULAR | Status: AC
  Filled 2019-10-28: qty 5

## 2019-10-28 MED ORDER — PANTOPRAZOLE SODIUM 40 MG PO TBEC
40.0000 mg | DELAYED_RELEASE_TABLET | Freq: Two times a day (BID) | ORAL | Status: DC
  Administered 2019-10-28 – 2019-10-29 (×2): 40 mg via ORAL
  Filled 2019-10-28 (×2): qty 1

## 2019-10-28 MED ORDER — KETAMINE HCL 50 MG/5ML IJ SOSY
PREFILLED_SYRINGE | INTRAMUSCULAR | Status: AC
  Filled 2019-10-28: qty 5

## 2019-10-28 MED ORDER — PROPOFOL 500 MG/50ML IV EMUL
INTRAVENOUS | Status: DC | PRN
  Administered 2019-10-28: 25 ug/kg/min via INTRAVENOUS

## 2019-10-28 MED ORDER — GLYCOPYRROLATE PF 0.2 MG/ML IJ SOSY
PREFILLED_SYRINGE | INTRAMUSCULAR | Status: DC | PRN
  Administered 2019-10-28: .2 mg via INTRAVENOUS

## 2019-10-28 MED ORDER — METHYLPREDNISOLONE SODIUM SUCC 40 MG IJ SOLR
20.0000 mg | Freq: Three times a day (TID) | INTRAMUSCULAR | Status: DC
  Administered 2019-10-28 – 2019-10-29 (×3): 20 mg via INTRAVENOUS
  Filled 2019-10-28 (×3): qty 1

## 2019-10-28 MED ORDER — KETAMINE HCL 10 MG/ML IJ SOLN
INTRAMUSCULAR | Status: DC | PRN
  Administered 2019-10-28 (×2): 10 mg via INTRAVENOUS

## 2019-10-28 MED ORDER — PROPOFOL 10 MG/ML IV BOLUS
INTRAVENOUS | Status: DC | PRN
  Administered 2019-10-28: 100 mg via INTRAVENOUS

## 2019-10-28 MED ORDER — ONDANSETRON HCL 4 MG/2ML IJ SOLN
INTRAMUSCULAR | Status: AC
  Filled 2019-10-28: qty 2

## 2019-10-28 MED ORDER — SIMETHICONE 40 MG/0.6ML PO SUSP
ORAL | Status: DC | PRN
  Administered 2019-10-28: 12:00:00 2.5 mL

## 2019-10-28 NOTE — Interval H&P Note (Signed)
History and Physical Interval Note:  10/28/2019 12:07 PM  Gregory Alvarado  has presented today for surgery, with the diagnosis of MELENA.  The various methods of treatment have been discussed with the patient and family. After consideration of risks, benefits and other options for treatment, the patient has consented to  Procedure(s): ESOPHAGOGASTRODUODENOSCOPY (EGD) WITH PROPOFOL (N/A) as a surgical intervention.  The patient's history has been reviewed, patient examined, no change in status, stable for surgery.  I have reviewed the patient's chart and labs.  Questions were answered to the patient's satisfaction.     [employer]

## 2019-10-28 NOTE — Progress Notes (Signed)
Held 1000 am meds due to patient having EDG this am. Will give when patient returns to floor.

## 2019-10-28 NOTE — Progress Notes (Addendum)
Pt refused his post blood dose of lasix tonight. Educated patient as to why he needed it. He verbalized understanding but states that "I don't want to be up all night using the bathroom."

## 2019-10-28 NOTE — Anesthesia Preprocedure Evaluation (Signed)
Anesthesia Evaluation  Patient identified by MRN, date of birth, ID band Patient awake    Reviewed: Allergy & Precautions, NPO status , Patient's Chart, lab work & pertinent test results  Airway Mallampati: II  TM Distance: >3 FB Neck ROM: Full    Dental  (+) Edentulous Upper, Edentulous Lower   Pulmonary COPD,  COPD inhaler, Current Smoker and Patient abstained from smoking.,    breath sounds clear to auscultation       Cardiovascular Exercise Tolerance: Poor hypertension, Pt. on medications + Valvular Problems/Murmurs  Rhythm:Regular Rate:Normal  26-Oct-2019 09:01:52 [place] [facility]-ER ROUTINE RECORD Sinus tachycardia Borderline right axis deviation Nonspecific repol abnormality, diffuse leads Since last tracing ST abnormality is new Otherwise no significant change Confirmed by Daleen Bo [phone]) on 10/26/2019 9:06:59 AM   Neuro/Psych negative neurological ROS  negative psych ROS   GI/Hepatic GERD (occasional)  ,  Endo/Other  diabetes, Poorly Controlled, Type 2, Oral Hypoglycemic Agents, Insulin Dependent  Renal/GU Renal disease     Musculoskeletal  (+) Arthritis , Osteoarthritis,    Abdominal   Peds  Hematology  (+) anemia ,   Anesthesia Other Findings   Reproductive/Obstetrics                             Anesthesia Physical Anesthesia Plan  ASA: IV  Anesthesia Plan: General   Post-op Pain Management:    Induction: Intravenous  PONV Risk Score and Plan: 1 and TIVA  Airway Management Planned: Nasal Cannula, Natural Airway and Simple Face Mask  Additional Equipment:   Intra-op Plan:   Post-operative Plan: Possible Post-op intubation/ventilation  Informed Consent: I have reviewed the patients History and Physical, chart, labs and discussed the procedure including the risks, benefits and alternatives for the proposed anesthesia with the patient or authorized  representative who has indicated his/her understanding and acceptance.       Plan Discussed with: Surgeon  Anesthesia Plan Comments:         Anesthesia Quick Evaluation

## 2019-10-28 NOTE — Progress Notes (Signed)
Patient Demographics:    Gregory Alvarado, is a 69 y.o. male, DOB - [birth year], [id number]  Admit date - 10/26/2019   Admitting Physician Shuaib Corsino Denton Brick, MD  Outpatient Primary MD for the patient is Celene Squibb, MD  LOS - 1   Chief Complaint  Patient presents with  . Abnormal Lab        Subjective:    Gregory Alvarado today has no fevers, no emesis,  No chest pain No dyspnea at rest   Assessment  & Plan :    Principal Problem:   Symptomatic anemia Active Problems:   Hypertension   Type 2 diabetes mellitus with stage 3 chronic kidney disease, without long-term current use of insulin (HCC)   COPD (chronic obstructive pulmonary disease) (HCC)   Current smoker   Personal history of noncompliance with medical treatment, presenting hazards to health   Obesity, Class III, BMI 40-49.9 (morbid obesity) (HCC)   Rectal bleeding   GI bleed  Brief Summary:- 69 y.o. male smoker with past medical history relevant for DM2, HTN and morbid obesity admitted on 10/25/2018 with symptomatic anemia with hemoglobin down to 5.4 in the setting of twice daily BC powder use  A/p 1)symptomatic possibly acute blood loss anemia--- admitted with fatigue, dizziness and shortness of breath in the setting of hemoglobin of 5.4 with previous baseline around 13 -Hemoglobin up to 8.4 after transfusion of 3 units of packed red blood cell -Patient admits to [facility] powder use at least twice daily as well as intermittent dark stools and epigastric pain for over 6 months -EGD on 10/27/2018 with gastric ulcers and moderate gastroduodenitis, biopsy/pathology pending --Continue IV Protonix  -GI consult for endoluminal evaluation appreciated --Patient with hypochromic and microcytic anemia -Work-up reveals both iron and B12 as well as operative folate deficiency -Patient received IM B12 shot and replace folic acid   2) GI bleed presumed  acute--- manage as above #1 -Note that patient had segmental colon resection for complicated diverticulitis in the 1990s, he apparently had colonoscopy at that time, no endoluminal evaluation since then  3)DM2--A1c 6.2 reflecting excellent diabetic control PTA, hold scheduled insulin,Use Novolog/Humalog Sliding scale insulin with Accu-Cheks/Fingersticks as ordered   4)HTN-stable, continue lisinopril 20 mg daily, hold p.o. Lasix  5)HLD-stable, continue atorvastatin, hold aspirin  6) acute COPD exacerbation-resolved wheezing,, dyspnea and cough improving --- continue IV Solu-Medrol, bronchodilators as ordered -Respiratory status much improved with steroids and bronchodilators   7)Tobacco Abuse--- patient smokes 2 packs/day, smoking cessation advised, nicotine patch given  Disposition/Need for in-Hospital Stay- patient unable to be discharged at this time due to --- symptomatic anemia, -We will discharge home on 10/29/2019 if tolerating oral intake and no further GI bleed concerns  Code Status : Full  Family Communication:   NA (patient is alert, awake and coherent)  Consults  :  Gi  -Procedures:-  EGD 10/28/2019   Dysphagia DUE TO UNCONTROLLED GERD V. ESOPHAGEAL WEB.                           - MELENA DUE TO gastric ulcers.                           -  MODERATE Gastritis/Duodenitis.  DVT Prophylaxis  :  Gi bleed- SCDs   Lab Results  Component Value Date   PLT 354 10/28/2019    Inpatient Medications  Scheduled Meds: . sodium chloride   Intravenous Once  . atorvastatin  40 mg Oral QHS  . folic acid  1 mg Oral Daily  . furosemide  40 mg Intravenous Once  . guaiFENesin  600 mg Oral BID  . insulin aspart  0-15 Units Subcutaneous TID WC  . insulin aspart  0-5 Units Subcutaneous QHS  . ipratropium-albuterol  3 mL Nebulization QID  . linaclotide  145 mcg Oral QAC breakfast  . lisinopril  20 mg Oral Daily  . methocarbamol  750 mg Oral TID  . methylPREDNISolone (SOLU-MEDROL)  injection  20 mg Intravenous Q8H  . nicotine  21 mg Transdermal Daily  . pantoprazole  40 mg Oral BID AC  . sodium chloride flush  3 mL Intravenous Q12H  . traZODone  150 mg Oral QHS  . vitamin B-12  250 mcg Oral Daily   Continuous Infusions: . sodium chloride     PRN Meds:.sodium chloride, acetaminophen **OR** acetaminophen, albuterol, ondansetron **OR** ondansetron (ZOFRAN) IV, polyethylene glycol, sodium chloride flush, zolpidem   Anti-infectives (From admission, onward)   None        Objective:   Vitals:   10/28/19 1242 10/28/19 1245 10/28/19 1300 10/28/19 1523  BP:  (!) 111/55 (!) 116/57   Pulse: (!) 104 (!) 108 (!) 101   Resp: 19 20 14    Temp:      TempSrc:      SpO2: 93% 92% 91% 93%  Weight:      Height:        Wt Readings from Last 3 Encounters:  10/26/19 (!) 139.8 kg  01/27/19 136.1 kg  10/13/18 (!) 138.8 kg     Intake/Output Summary (Last 24 hours) at 10/28/2019 1621 Last data filed at 10/28/2019 1238 Gross per 24 hour  Intake 1762 ml  Output 2100 ml  Net -338 ml     Physical Exam  Gen:- Awake Alert,  In no apparent distress  HEENT:- [place].AT, No sclera icterus Neck-Supple Neck,No JVD,.  Lungs-fair air movement, no wheezing CV- S1, S2 normal, regular  Abd-  +ve B.Sounds, Abd Soft, epigastric discomfort without rebound, increased truncal adiposity    Extremity/Skin:- No  edema, pedal pulses present  Psych-affect is appropriate, oriented x3 Neuro-no new focal deficits, no tremors   Data Review:   Micro Results Recent Results (from the past 240 hour(s))  SARS CORONAVIRUS 2 (TAT 6-24 HRS) Nasopharyngeal Nasopharyngeal Swab     Status: None   Collection Time: 10/26/19 11:59 AM   Specimen: Nasopharyngeal Swab  Result Value Ref Range Status   SARS Coronavirus 2 NEGATIVE NEGATIVE Final    Comment: (NOTE) SARS-CoV-2 target nucleic acids are NOT DETECTED. The SARS-CoV-2 RNA is generally detectable in upper and lower respiratory specimens during the  acute phase of infection. Negative results do not preclude SARS-CoV-2 infection, do not rule out co-infections with other pathogens, and should not be used as the sole basis for treatment or other patient management decisions. Negative results must be combined with clinical observations, patient history, and epidemiological information. The expected result is Negative. Fact Sheet for Patients: SugarRoll.be Fact Sheet for Healthcare Providers: https://www.woods-mathews.com/ This test is not yet approved or cleared by the [place] FDA and  has been authorized for detection and/or diagnosis of SARS-CoV-2 by FDA under an Emergency Use Authorization (EUA). This  EUA will remain  in effect (meaning this test can be used) for the duration of the COVID-19 declaration under Section 56 4(b)(1) of the Act, 21 U.S.C. section 360bbb-3(b)(1), unless the authorization is terminated or revoked sooner. Performed at [facility], [place] [address] [place], [place] [address]    Radiology Reports No results found.   CBC Recent Labs  Lab 10/26/19 0924 10/27/19 0552 10/28/19 0656  WBC 12.3* 12.7* 13.0*  HGB 5.4* 7.4* 8.4*  HCT 21.5* 27.2* 30.2*  PLT 298 330 354  MCV 73.6* 75.8* 76.8*  MCH 18.5* 20.6* 21.4*  MCHC 25.1* 27.2* 27.8*  RDW 19.6* 20.1* 21.4*  LYMPHSABS 0.9  --   --   MONOABS 0.8  --   --   EOSABS 0.2  --   --   BASOSABS 0.0  --   --     Chemistries  Recent Labs  Lab 10/26/19 0924 10/27/19 0552 10/28/19 0656  NA 136 138 133*  K 4.1 4.6 4.9  CL 103 100 96*  CO2 28 27 25   GLUCOSE 119* 109* 250*  BUN 17 15 23   CREATININE 1.40* 1.47* 1.41*  CALCIUM 8.3* 9.0 9.0  AST 12*  --   --   ALT 13  --   --   ALKPHOS 74  --   --   BILITOT 0.5  --   --    ------------------------------------------------------------------------------------------------------------------ No results for input(s): CHOL, HDL, LDLCALC, TRIG, CHOLHDL,  LDLDIRECT in the last 72 hours.  Lab Results  Component Value Date   HGBA1C 6.2 (H) 10/26/2019   ------------------------------------------------------------------------------------------------------------------ No results for input(s): TSH, T4TOTAL, T3FREE, THYROIDAB in the last 72 hours.  Invalid input(s): FREET3 ------------------------------------------------------------------------------------------------------------------ Recent Labs    10/26/19 1105 10/26/19 1128  VITAMINB12 224  --   FOLATE 3.7*  --   FERRITIN 4*  --   TIBC 516*  --   IRON 12*  --   RETICCTPCT  --  3.6*    Coagulation profile Recent Labs  Lab 10/26/19 1013  INR 1.1    No results for input(s): DDIMER in the last 72 hours.  Cardiac Enzymes No results for input(s): CKMB, TROPONINI, MYOGLOBIN in the last 168 hours.  Invalid input(s): CK ------------------------------------------------------------------------------------------------------------------ No results found for: BNP   Roxan Hockey M.D on 10/28/2019 at 4:21 PM  Go to [email or web address] - for contact info  [facility] - Office  [phone]

## 2019-10-28 NOTE — Op Note (Addendum)
[facility] Patient Name: Gregory Alvarado Procedure Date: 10/28/2019 11:23 AM MRN: [id number] Date of Birth: August 31, 1951 Attending MD: Barney Drain MD, MD CSN: [id number] Age: 69 Admit Type: Inpatient Procedure:                Upper GI endoscopy WITH COLD FORCEPS                            BIOPSY/ESOPHAGEAL DILATION Indications:              Melena Providers:                Barney Drain MD, MD, Lurline Del, RN, Aram Candela Referring MD:             Norvel Richards, MD Medicines:                Propofol per Anesthesia Complications:            No immediate complications. Estimated Blood Loss:     Estimated blood loss was minimal. Procedure:                Pre-Anesthesia Assessment:                           - Prior to the procedure, a History and Physical                            was performed, and patient medications and                            allergies were reviewed. The patient's tolerance of                            previous anesthesia was also reviewed. The risks                            and benefits of the procedure and the sedation                            options and risks were discussed with the patient.                            All questions were answered, and informed consent                            was obtained. Prior Anticoagulants: The patient has                            taken no previous anticoagulant or antiplatelet                            agents except for aspirin. ASA Grade Assessment:                            III - A patient with severe systemic disease. After  reviewing the risks and benefits, the patient was                            deemed in satisfactory condition to undergo the                            procedure. After obtaining informed consent, the                            endoscope was passed under direct vision.                            Throughout the procedure, the patient's blood                pressure, pulse, and oxygen saturations were                            monitored continuously. The [id number]                            scope was introduced through the mouth, and                            advanced to the second part of duodenum. The upper                            GI endoscopy was accomplished without difficulty.                            The patient tolerated the procedure well. Scope In: 12:13:54 PM Scope Out: 12:23:09 PM Total Procedure Duration: 0 hours 9 minutes 15 seconds  Findings:      No endoscopic abnormality was evident in the esophagus to explain the       patient's complaint of dysphagia. It was decided, however, to proceed       with dilation of the entire esophagus DUE TO POSSIBLE OCCULT ESOPAHGEAL       WEB. A guidewire was placed and the scope was withdrawn. Dilation was       performed with a Savary dilator with mild resistance at 15 mm, 16 mm and       17 mm.      Six non-bleeding cratered gastric ulcers with no stigmata of bleeding       were found in the gastric antrum and in the prepyloric region of the       stomach.      Diffuse moderate inflammation characterized by congestion (edema),       erosions and erythema was found in the entire examined stomach.       Biopsies(2;BODY,1:INCISURA,2:ANTRUM) were taken with a cold forceps for       Helicobacter pylori testing.      Diffuse moderate inflammation characterized by congestion (edema),       erosions and erythema was found in the entire duodenum. Impression:               - Dysphagia DUE TO UNCONTROLLED GERD V. ESOPHAGEAL  WEB.                           - MELENA DUE TO gastric ulcers.                           - MODERATE Gastritis/Duodenitis. Moderate Sedation:      Per Anesthesia Care Recommendation:           - Return patient to hospital [facility] for ongoing care.                           - Diabetic (ADA) diet.                           -  Continue present medications. PROTONIX BID FOR                            ONE MONTH THEN ONCE DAILY FOR 4 MOS.AFTER 5 MOS USE                            PROTONIX PRN TO PREVENT ULCERS IF ASA/NSAIDS ARE                            MEDICALLY NECESSARY.                           - Await pathology results.                           - Repeat upper endoscopy in 3 months for                            surveillance AS WELL AS A SCREENING COLONOSCOPY                           - Return to GI office in 6 months. Procedure Code(s):        --- Professional ---                           [phone], Esophagogastroduodenoscopy, flexible,                            transoral; with insertion of guide wire followed by                            passage of dilator(s) through esophagus over guide                            wire                           [id number], 59, Esophagogastroduodenoscopy, flexible,                            transoral; with biopsy, single or multiple Diagnosis Code(s):        ---  Professional ---                           R13.10, Dysphagia, unspecified                           K25.9, Gastric ulcer, unspecified as acute or                            chronic, without hemorrhage or perforation                           K29.70, Gastritis, unspecified, without bleeding                           K29.80, Duodenitis without bleeding                           K92.1, Melena (includes Hematochezia) CPT copyright 2019 American Medical Association. All rights reserved. The codes documented in this report are preliminary and upon coder review may  be revised to meet current compliance requirements. Barney Drain, MD Barney Drain MD, MD 10/28/2019 12:35:47 PM This report has been signed electronically. Number of Addenda: 0

## 2019-10-28 NOTE — Transfer of Care (Signed)
Immediate Anesthesia Transfer of Care Note  Patient: Gregory Alvarado  Procedure(s) Performed: ESOPHAGOGASTRODUODENOSCOPY (EGD) WITH PROPOFOL (N/A ) BIOPSY  Patient Location: PACU  Anesthesia Type:General  Level of Consciousness: awake, sedated and drowsy  Airway & Oxygen Therapy: Patient Spontanous Breathing and Patient connected to nasal cannula oxygen  Post-op Assessment: Report given to RN and Post -op Vital signs reviewed and stable  Post vital signs: stable  Last Vitals:  Vitals Value Taken Time  BP 127/64   Temp 98.4   Pulse 109   Resp 18   SpO2 95     Last Pain:  Vitals:   10/28/19 1149  TempSrc: Oral  PainSc: 0-No pain      Patients Stated Pain Goal: 6 ([phone])  Complications: No apparent anesthesia complications

## 2019-10-28 NOTE — Anesthesia Postprocedure Evaluation (Signed)
Anesthesia Post Note  Patient: Gregory Alvarado  Procedure(s) Performed: ESOPHAGOGASTRODUODENOSCOPY (EGD) WITH PROPOFOL (N/A ) BIOPSY  Patient location during evaluation: PACU Anesthesia Type: General Level of consciousness: awake and alert and oriented Pain management: pain level controlled Vital Signs Assessment: post-procedure vital signs reviewed and stable Respiratory status: spontaneous breathing Cardiovascular status: blood pressure returned to baseline and stable (heart rate to preop levels) Postop Assessment: no apparent nausea or vomiting Anesthetic complications: no     Last Vitals:  Vitals:   10/28/19 1232 10/28/19 1245  BP: 127/64   Pulse: (!) 109 (!) 108  Resp: 18 20  Temp: 36.9 C   SpO2: 93% 92%    Last Pain:  Vitals:   10/28/19 1232  TempSrc:   PainSc: 0-No pain                 Zyan Mirkin C Alinna Siple

## 2019-10-29 ENCOUNTER — Telehealth: Payer: Self-pay | Admitting: Gastroenterology

## 2019-10-29 DIAGNOSIS — D649 Anemia, unspecified: Secondary | ICD-10-CM

## 2019-10-29 LAB — BASIC METABOLIC PANEL
Anion gap: 9 (ref 5–15)
BUN: 34 mg/dL — ABNORMAL HIGH (ref 8–23)
CO2: 27 mmol/L (ref 22–32)
Calcium: 9.1 mg/dL (ref 8.9–10.3)
Chloride: 97 mmol/L — ABNORMAL LOW (ref 98–111)
Creatinine, Ser: 1.38 mg/dL — ABNORMAL HIGH (ref 0.61–1.24)
GFR calc Af Amer: >60 mL/min (ref >60)
GFR calc non Af Amer: 53 mL/min — ABNORMAL LOW (ref >60)
Glucose, Bld: 220 mg/dL — ABNORMAL HIGH (ref 70–99)
Potassium: 5.2 mmol/L — ABNORMAL HIGH (ref 3.5–5.1)
Sodium: 133 mmol/L — ABNORMAL LOW (ref 135–145)

## 2019-10-29 LAB — CBC
HCT: 31.1 % — ABNORMAL LOW (ref 39.0–52.0)
Hemoglobin: 8.4 g/dL — ABNORMAL LOW (ref 13.0–17.0)
MCH: 21.3 pg — ABNORMAL LOW (ref 26.0–34.0)
MCHC: 27 g/dL — ABNORMAL LOW (ref 30.0–36.0)
MCV: 78.7 fL — ABNORMAL LOW (ref 80.0–100.0)
Platelets: 342 10*3/uL (ref 150–400)
RBC: 3.95 MIL/uL — ABNORMAL LOW (ref 4.22–5.81)
RDW: 22.5 % — ABNORMAL HIGH (ref 11.5–15.5)
WBC: 20.5 10*3/uL — ABNORMAL HIGH (ref 4.0–10.5)
nRBC: 0 % (ref 0.0–0.2)

## 2019-10-29 LAB — GLUCOSE, CAPILLARY
Glucose-Capillary: 214 mg/dL — ABNORMAL HIGH (ref 70–99)
Glucose-Capillary: 276 mg/dL — ABNORMAL HIGH (ref 70–99)

## 2019-10-29 MED ORDER — GUAIFENESIN ER 600 MG PO TB12: 600.0000 mg | ORAL_TABLET | Freq: Two times a day (BID) | ORAL | 0 refills | Status: DC

## 2019-10-29 MED ORDER — POLYETHYLENE GLYCOL 3350 17 G PO PACK: 17.0000 g | PACK | Freq: Every day | ORAL | 0 refills | Status: DC

## 2019-10-29 MED ORDER — NICOTINE 21 MG/24HR TD PT24: 21.0000 mg | MEDICATED_PATCH | Freq: Every day | TRANSDERMAL | 0 refills | Status: DC

## 2019-10-29 MED ORDER — PREDNISONE 20 MG PO TABS: 40.0000 mg | ORAL_TABLET | Freq: Every day | ORAL | 0 refills | Status: DC

## 2019-10-29 MED ORDER — FOLIC ACID 1 MG PO TABS: 1.0000 mg | ORAL_TABLET | Freq: Every day | ORAL | 2 refills | Status: DC

## 2019-10-29 MED ORDER — ALBUTEROL SULFATE HFA 108 (90 BASE) MCG/ACT IN AERS: 2.0000 | INHALATION_SPRAY | Freq: Four times a day (QID) | RESPIRATORY_TRACT | 0 refills | Status: DC | PRN

## 2019-10-29 MED ORDER — FLUTICASONE-SALMETEROL 250-50 MCG/DOSE IN AEPB: 1.0000 | INHALATION_SPRAY | Freq: Two times a day (BID) | RESPIRATORY_TRACT | 2 refills | Status: DC

## 2019-10-29 MED ORDER — PANTOPRAZOLE SODIUM 40 MG PO TBEC: 40.0000 mg | DELAYED_RELEASE_TABLET | Freq: Two times a day (BID) | ORAL | 2 refills | Status: DC

## 2019-10-29 MED ORDER — ACETAMINOPHEN 325 MG PO TABS: 650.0000 mg | ORAL_TABLET | Freq: Four times a day (QID) | ORAL | 0 refills | Status: DC | PRN

## 2019-10-29 MED ORDER — SODIUM ZIRCONIUM CYCLOSILICATE 5 G PO PACK
5.0000 g | PACK | Freq: Once | ORAL | Status: AC
  Administered 2019-10-29: 5 g via ORAL
  Filled 2019-10-29: qty 1

## 2019-10-29 MED ORDER — VITAMIN B-12 100 MCG PO TABS
250.0000 ug | ORAL_TABLET | Freq: Every day | ORAL | Status: DC
  Administered 2019-10-29: 12:00:00 250 ug via ORAL

## 2019-10-29 NOTE — Progress Notes (Signed)
  Assessment/Plan: ADMITTED WITH MELENA/ANEMIA WHILE TAKING 4 BC POWDERS A DAY AND NO PPI. EGD APR 13: PUD. CLINICALLY IMPROVED.  PLAN: 1. BID PPI FOR ONE MONTH THEN DAILY FOR 4 MOS THEN PRN. 2. OPV IN 3 MOS TO SCHEDULE EGD/TCS WITH DR. Gala Romney. 3. MAY RE-START ASA PRODUCTS IN 2 WEEKS.   Subjective: Since I last evaluated the patient HE DENIES ABDOMINAL PAIN, NAUSEA, OR VOMITING. NO BRBPR OR MELENA.  Objective: Vital signs in last 24 hours: Vitals:   10/29/19 0513 10/29/19 0823  BP: (!) 120/51   Pulse: 94   Resp: 17   Temp: 98.4 F (36.9 C)   SpO2: 93% 94%   General appearance: alert, cooperative and no distress Resp: clear to auscultation bilaterally Cardio: regular rate and rhythm GI: soft, non-tender; bowel sounds normal;   Lab Results:  K 5.2 Cr 1.38 WBC 20.5 Hb 8.4 PLT CT 342   Studies/Results: No results found.  Medications: I have reviewed the patient's current medications.

## 2019-10-29 NOTE — Discharge Summary (Signed)
Shanna Rugar Stuber, is a 69 y.o. male  DOB Feb 21, 1951  MRN [id number].  Admission date:  10/26/2019  Admitting Physician  Shemika Robbs Denton Brick, MD  Discharge Date:  10/29/2019   Primary MD  Celene Squibb, MD  Recommendations for primary care physician for things to follow:   1)Avoid ibuprofen/Advil/Aleve/Motrin/Goody Powders/Naproxen/BC powders/Meloxicam/Diclofenac/Indomethacin and other Nonsteroidal anti-inflammatory medications as these will make you more likely to bleed and can cause stomach ulcers, can also cause Kidney problems.   2) please take Protonix 40 mg twice a day as prescribed for stomach ulcers  3) please call or return if dark stools or concerns about further bleeding  4) follow-up with gastroenterologist Dr. Oneida Alar as advised in 3 months for recheck and reevaluation and possible colonoscopy as outpatient  5) abstinence from tobacco and complete smoking cessation advised  6) you have B12 deficiency----you need to get vitamin B12 injections every month starting around March 10th, 2021  Admission Diagnosis  Rectal bleeding [K62.5] Symptomatic anemia [D64.9] Iron deficiency anemia, unspecified iron deficiency anemia type [D50.9] GI bleed [K92.2]   Discharge Diagnosis  Rectal bleeding [K62.5] Symptomatic anemia [D64.9] Iron deficiency anemia, unspecified iron deficiency anemia type [D50.9] GI bleed [K92.2]   Principal Problem:   Symptomatic anemia Active Problems:   Hypertension   Type 2 diabetes mellitus with stage 3 chronic kidney disease, without long-term current use of insulin (HCC)   COPD (chronic obstructive pulmonary disease) ([place])   Current smoker   Personal history of noncompliance with medical treatment, presenting hazards to health   Obesity, Class III, BMI 40-49.9 (morbid obesity) ([place])   Rectal bleeding   GI bleed      Past Medical History:  Diagnosis Date  . Arthritis    left shoulder  . COPD (chronic obstructive pulmonary disease) ([place])   . Diabetes mellitus without complication ([place])   . Dyslipidemia   . Hypertension   . Insomnia   . Insomnia   . Morbid obesity ([place])   . Tobacco use     Past Surgical History:  Procedure Laterality Date  . COLON RESECTION  1990s?   DIVERTICULITIS  . HERNIA REPAIR     UHR  . ROTATOR CUFF REPAIR Bilateral   . TYMPANOPLASTY       HPI  from the history and physical done on the day of admission:     Joniel Kokoszka  is a 69 y.o. male smoker with past medical history relevant for DM2, HTN and morbid obesity who presents with complaints of fatigue, dyspnea and dyspnea on exertion as well and dizziness after being told by PCP to come to the ED due to low hemoglobin  -Patient reports using BC powders twice a day for the last 6 to 8 months, having intermittent dark stools as well as epigastric discomfort, -In the ED hemoglobin is 5.4 with low MCV and low MCH, as well as low ferritin, low serum iron and low B12 and relatively low folate  -Denies hematochezia denies hematemesis denies emesis, no chest pains -No fevers or  chills, no productive cough, no leg pains no leg swelling no pleuritic type symptoms -Note that patient had segmental colon resection for complicated diverticulitis in the 1990s, he apparently had colonoscopy at that time, no endoluminal evaluation since then  =-Stool occult blood is positive   Hospital Course:    Brief Summary:- 69 y.o.malesmoker with past medical history relevant for DM2, HTN and morbid obesity admitted on 10/25/2018 with symptomatic anemia with hemoglobin down to 5.4 in the setting of twice daily BC powder use  A/p 1)symptomatic  acute blood loss anemia---admitted with fatigue, dizziness and shortness of breath in the setting of hemoglobin of 5.4 with previous baseline around 13 -Hemoglobin up to 8.4 after transfusion of 3 units of packed red blood cell -Patient [facility] to [facility]  powder use at least twice daily as well as intermittent dark stools and epigastric pain for over 6 months -EGD on 10/27/2018 with gastric ulcers and moderate gastroduodenitis, biopsy/pathology pending --Continue IV Protonix  -GI consult for endoluminal evaluation appreciated --Patient with hypochromic and microcytic anemia -Work-up reveals both iron and B12 as well as operative folate deficiency -Patient received IM B12 shot and replace folic acid -Continue monthly B12 shots  2)GI bleed presumed acute---manage as above #1 -Note that patient had segmental colon resection for complicated diverticulitis in the 1990s, he apparently had colonoscopy at that time,no endoluminal evaluation since then -Please see #1 above,  3)DM2--A1c 6.2 reflecting excellent diabetic control PTA,resume home insulin regimen   4)HTN-stable, continue lisinopril 20 mg daily,hold p.o. Lasix  5)HLD-stable, continue atorvastatin, hold aspirin  6) acute COPD exacerbation-overall improved respiratory status, transition from IV Solu-Medrol to p.o. prednisone, discharged on scheduled Advair and as needed albuterol inhaler  7)Tobacco Abuse---patient smokes 2 packs/day, smoking cessation advised, okay to use OTC nicotine patch   Disposition/--- discharge home in stable condition  Code Status : Full  Family Communication:   NA (patient is alert, awake and coherent)  Consults  :  Gi  -Procedures:-  EGD 10/28/2019  Dysphagia DUE TO UNCONTROLLED GERD V. ESOPHAGEAL WEB. - MELENA DUE TO gastric ulcers. - MODERATE Gastritis/Duodenitis.   Discharge Condition: stable  Follow UP--- GI physician Dr. Oneida Alar in a couple months   Diet and Activity recommendation:  As advised  Discharge Instructions    Discharge Instructions    Call MD for:  difficulty breathing, headache or visual disturbances   Complete by: As directed    Call MD for:  persistant  dizziness or light-headedness   Complete by: As directed    Call MD for:  persistant nausea and vomiting   Complete by: As directed    Call MD for:  severe uncontrolled pain   Complete by: As directed    Call MD for:  temperature >100.4   Complete by: As directed    Diet - low sodium heart healthy   Complete by: As directed    Discharge instructions   Complete by: As directed    1)Avoid ibuprofen/Advil/Aleve/Motrin/Goody Powders/Naproxen/BC powders/Meloxicam/Diclofenac/Indomethacin and other Nonsteroidal anti-inflammatory medications as these will make you more likely to bleed and can cause stomach ulcers, can also cause Kidney problems.   2) please take Protonix 40 mg twice a day as prescribed for stomach ulcers  3) please call or return if dark stools or concerns about further bleeding  4) follow-up with gastroenterologist Dr. Oneida Alar as advised in 3 months for recheck and reevaluation and possible colonoscopy as outpatient  5) abstinence from tobacco and complete smoking cessation advised  6) you have  B12 deficiency----you need to get vitamin B12 injections every month starting around March 10th, 2021   Increase activity slowly   Complete by: As directed         Discharge Medications     Allergies as of 10/29/2019      Reactions   Caduet [amlodipine-atorvastatin]    swelling      Medication List    STOP taking these medications   aspirin EC 81 MG tablet     TAKE these medications   acetaminophen 325 MG tablet Commonly known as: TYLENOL Take 2 tablets (650 mg total) by mouth every 6 (six) hours as needed for mild pain (or Fever >/= 101).   albuterol 108 (90 Base) MCG/ACT inhaler Commonly known as: VENTOLIN HFA Inhale 2 puffs into the lungs every 6 (six) hours as needed for wheezing or shortness of breath. What changed: how much to take   atorvastatin 40 MG tablet Commonly known as: LIPITOR Take 40 mg by mouth at bedtime.   Fluticasone-Salmeterol 250-50  MCG/DOSE Aepb Commonly known as: Advair Diskus Inhale 1 puff into the lungs 2 (two) times daily.   folic acid 1 MG tablet Commonly known as: FOLVITE Take 1 tablet (1 mg total) by mouth daily. Start taking on: October 30, 2019   furosemide 40 MG tablet Commonly known as: LASIX Take 40 mg by mouth every other day.   guaiFENesin 600 MG 12 hr tablet Commonly known as: MUCINEX Take 1 tablet (600 mg total) by mouth 2 (two) times daily.   insulin NPH-regular Human (70-30) 100 UNIT/ML injection Inject 30 Units into the skin 2 (two) times daily with a meal.   Insulin Pen Needle 31G X 8 MM Misc Commonly known as: B-D ULTRAFINE III SHORT PEN 1 each by Does not apply route as directed.   INSULIN SYRINGE .5CC/29G 29G X 1/2" 0.5 ML Misc Use to inject insulin 2 times a day   lisinopril 20 MG tablet Commonly known as: ZESTRIL Take 1 tablet (20 mg total) by mouth daily.   nicotine 21 mg/24hr patch Commonly known as: NICODERM CQ - dosed in mg/24 hours Place 1 patch (21 mg total) onto the skin daily. Start taking on: October 30, 2019   ONE TOUCH ULTRA TEST test strip Generic drug: glucose blood TEST FOUR TIMES DAILY AS DIRECTED.   pantoprazole 40 MG tablet Commonly known as: PROTONIX Take 1 tablet (40 mg total) by mouth 2 (two) times daily before a meal.   polyethylene glycol 17 g packet Commonly known as: MIRALAX / GLYCOLAX Take 17 g by mouth daily.   predniSONE 20 MG tablet Commonly known as: Deltasone Take 2 tablets (40 mg total) by mouth daily with breakfast.   traMADol 50 MG tablet Commonly known as: ULTRAM Take 50 mg by mouth every 6 (six) hours as needed.   vitamin B-12 250 MCG tablet Commonly known as: CYANOCOBALAMIN Take 250 mcg by mouth daily.   zolpidem 10 MG tablet Commonly known as: AMBIEN Take 5-10 mg by mouth at bedtime as needed for sleep.      Major procedures and Radiology Reports - PLEASE review detailed and final reports for all details, in brief -     No results found.  Micro Results    Recent Results (from the past 240 hour(s))  SARS CORONAVIRUS 2 (TAT 6-24 HRS) Nasopharyngeal Nasopharyngeal Swab     Status: None   Collection Time: 10/26/19 11:59 AM   Specimen: Nasopharyngeal Swab  Result Value Ref Range Status  SARS Coronavirus 2 NEGATIVE NEGATIVE Final    Comment: (NOTE) SARS-CoV-2 target nucleic acids are NOT DETECTED. The SARS-CoV-2 RNA is generally detectable in upper and lower respiratory specimens during the acute phase of infection. Negative results do not preclude SARS-CoV-2 infection, do not rule out co-infections with other pathogens, and should not be used as the sole basis for treatment or other patient management decisions. Negative results must be combined with clinical observations, patient history, and epidemiological information. The expected result is Negative. Fact Sheet for Patients: SugarRoll.be Fact Sheet for Healthcare Providers: https://www.woods-mathews.com/ This test is not yet approved or cleared by the [place] FDA and  has been authorized for detection and/or diagnosis of SARS-CoV-2 by FDA under an Emergency Use Authorization (EUA). This EUA will remain  in effect (meaning this test can be used) for the duration of the COVID-19 declaration under Section 56 4(b)(1) of the Act, 21 U.S.C. section 360bbb-3(b)(1), unless the authorization is terminated or revoked sooner. Performed at [facility], [place] [address], [place], [place] [address]        Today   Subjective    Gregory Alvarado today has no new complaints No fever  Or chills   No Nausea, Vomiting or Diarrhea           Patient has been seen and examined prior to discharge   Objective   Blood pressure (!) 120/51, pulse 94, temperature 98.4 F (36.9 C), resp. rate 17, height 6' (1.829 m), weight (!) 139.8 kg, SpO2 94 %.   Intake/Output Summary (Last 24 hours) at 10/29/2019  1206 Last data filed at 10/29/2019 0846 Gross per 24 hour  Intake 920 ml  Output --  Net 920 ml    Exam Gen:- Awake Alert, no acute distress  HEENT:- [place].AT, No sclera icterus Neck-Supple Neck,No JVD,.  Lungs-  CTAB , good air movement bilaterally  CV- S1, S2 normal, regular Abd-  +ve B.Sounds, Abd Soft, No tenderness,    Extremity/Skin:- No  edema,   good pulses Psych-affect is appropriate, oriented x3 Neuro-no new focal deficits, no tremors    Data Review   CBC w Diff:  Lab Results  Component Value Date   WBC 20.5 (H) 10/29/2019   HGB 8.4 (L) 10/29/2019   HGB 13.7 06/09/2017   HCT 31.1 (L) 10/29/2019   PLT 342 10/29/2019   LYMPHOPCT 7 10/26/2019   MONOPCT 7 10/26/2019   EOSPCT 2 10/26/2019   BASOPCT 0 10/26/2019    CMP:  Lab Results  Component Value Date   NA 133 (L) 10/29/2019   NA 138 05/18/2017   K 5.2 (H) 10/29/2019   CL 97 (L) 10/29/2019   CO2 27 10/29/2019   BUN 34 (H) 10/29/2019   BUN 22 05/18/2017   CREATININE 1.38 (H) 10/29/2019   PROT 6.6 10/26/2019   PROT 7.0 02/18/2017   ALBUMIN 3.3 (L) 10/26/2019   ALBUMIN 4.4 05/18/2017   BILITOT 0.5 10/26/2019   BILITOT <0.2 02/18/2017   ALKPHOS 74 10/26/2019   AST 12 (L) 10/26/2019   ALT 13 10/26/2019  .   Total Discharge time is about 33 minutes  Roxan Hockey M.D on 10/29/2019 at 12:06 PM  Go to [email or web address] -  for contact info  [facility] - Office  [phone]

## 2019-10-29 NOTE — Progress Notes (Signed)
Nsg Discharge Note  Admit Date:  10/26/2019 Discharge date: 10/29/2019   Gregory Alvarado to be D/C'd Home per MD order.  AVS completed.  Copy for chart, and copy for patient signed, and dated. Patient/caregiver able to verbalize understanding.  Discharge Medication: Allergies as of 10/29/2019      Reactions   Caduet [amlodipine-atorvastatin]    swelling      Medication List    STOP taking these medications   aspirin EC 81 MG tablet     TAKE these medications   acetaminophen 325 MG tablet Commonly known as: TYLENOL Take 2 tablets (650 mg total) by mouth every 6 (six) hours as needed for mild pain (or Fever >/= 101).   albuterol 108 (90 Base) MCG/ACT inhaler Commonly known as: VENTOLIN HFA Inhale 2 puffs into the lungs every 6 (six) hours as needed for wheezing or shortness of breath. What changed: how much to take   atorvastatin 40 MG tablet Commonly known as: LIPITOR Take 40 mg by mouth at bedtime.   Fluticasone-Salmeterol 250-50 MCG/DOSE Aepb Commonly known as: Advair Diskus Inhale 1 puff into the lungs 2 (two) times daily.   folic acid 1 MG tablet Commonly known as: FOLVITE Take 1 tablet (1 mg total) by mouth daily. Start taking on: October 30, 2019   furosemide 40 MG tablet Commonly known as: LASIX Take 40 mg by mouth every other day.   guaiFENesin 600 MG 12 hr tablet Commonly known as: MUCINEX Take 1 tablet (600 mg total) by mouth 2 (two) times daily.   insulin NPH-regular Human (70-30) 100 UNIT/ML injection Inject 30 Units into the skin 2 (two) times daily with a meal.   Insulin Pen Needle 31G X 8 MM Misc Commonly known as: B-D ULTRAFINE III SHORT PEN 1 each by Does not apply route as directed.   INSULIN SYRINGE .5CC/29G 29G X 1/2" 0.5 ML Misc Use to inject insulin 2 times a day   lisinopril 20 MG tablet Commonly known as: ZESTRIL Take 1 tablet (20 mg total) by mouth daily.   nicotine 21 mg/24hr patch Commonly known as: NICODERM CQ - dosed in mg/24  hours Place 1 patch (21 mg total) onto the skin daily. Start taking on: October 30, 2019   ONE TOUCH ULTRA TEST test strip Generic drug: glucose blood TEST FOUR TIMES DAILY AS DIRECTED.   pantoprazole 40 MG tablet Commonly known as: PROTONIX Take 1 tablet (40 mg total) by mouth 2 (two) times daily before a meal.   polyethylene glycol 17 g packet Commonly known as: MIRALAX / GLYCOLAX Take 17 g by mouth daily.   predniSONE 20 MG tablet Commonly known as: Deltasone Take 2 tablets (40 mg total) by mouth daily with breakfast.   traMADol 50 MG tablet Commonly known as: ULTRAM Take 50 mg by mouth every 6 (six) hours as needed.   vitamin B-12 250 MCG tablet Commonly known as: CYANOCOBALAMIN Take 250 mcg by mouth daily.   zolpidem 10 MG tablet Commonly known as: AMBIEN Take 5-10 mg by mouth at bedtime as needed for sleep.       Discharge Assessment: Vitals:   10/29/19 0823 10/29/19 1223  BP:    Pulse:    Resp:    Temp:    SpO2: 94% 94%   Skin clean, dry and intact without evidence of skin break down, no evidence of skin tears noted. IV catheter discontinued intact. Site without signs and symptoms of complications - no redness or edema noted at insertion  site, patient denies c/o pain - only slight tenderness at site.  Dressing with slight pressure applied.  D/c Instructions-Education: Discharge instructions given to patient/family with verbalized understanding. D/c education completed with patient/family including follow up instructions, medication list, d/c activities limitations if indicated, with other d/c instructions as indicated by MD - patient able to verbalize understanding, all questions fully answered. Patient instructed to return to ED, call 911, or call MD for any changes in condition.  Patient escorted via [place], and D/C home via private auto.  Loa Socks, RN 10/29/2019 1:18 PM

## 2019-10-29 NOTE — Telephone Encounter (Signed)
OPV IN 3 MOS, Dx:PUD/COLON CANCER SCREENING. NEEDSTO SCHEDULE EGD/TCS WITH DR. Gala Romney.

## 2019-10-29 NOTE — Discharge Instructions (Signed)
1)Avoid ibuprofen/Advil/Aleve/Motrin/Goody Powders/Naproxen/BC powders/Meloxicam/Diclofenac/Indomethacin and other Nonsteroidal anti-inflammatory medications as these will make you more likely to bleed and can cause stomach ulcers, can also cause Kidney problems.   2) please take Protonix 40 mg twice a day as prescribed for stomach ulcers  3) please call or return if dark stools or concerns about further bleeding  4) follow-up with gastroenterologist Dr. Oneida Alar as advised in 3 months for recheck and reevaluation and possible colonoscopy as outpatient  5) abstinence from tobacco and complete smoking cessation advised  6) you have B12 deficiency----you need to get vitamin B12 injections every month starting around March 10th, 2021

## 2019-10-30 ENCOUNTER — Encounter: Payer: Self-pay | Admitting: Gastroenterology

## 2019-10-30 DIAGNOSIS — Z0001 Encounter for general adult medical examination with abnormal findings: Secondary | ICD-10-CM | POA: Diagnosis not present

## 2019-10-30 DIAGNOSIS — D509 Iron deficiency anemia, unspecified: Secondary | ICD-10-CM | POA: Diagnosis not present

## 2019-10-30 LAB — GLUCOSE, CAPILLARY: Glucose-Capillary: 221 mg/dL — ABNORMAL HIGH (ref 70–99)

## 2019-10-31 ENCOUNTER — Telehealth: Payer: Self-pay | Admitting: Gastroenterology

## 2019-10-31 ENCOUNTER — Other Ambulatory Visit: Payer: Self-pay

## 2019-10-31 LAB — SURGICAL PATHOLOGY

## 2019-10-31 MED ORDER — AMOXICILLIN 500 MG PO TABS: ORAL_TABLET | ORAL | 0 refills | Status: DC

## 2019-10-31 MED ORDER — CLARITHROMYCIN 500 MG PO TABS: ORAL_TABLET | ORAL | 0 refills | Status: DC

## 2019-10-31 NOTE — Telephone Encounter (Signed)
LMOM to call.

## 2019-10-31 NOTE — Telephone Encounter (Signed)
PLEASE CALL PT. HIS stomach Bx showed H. Pylori infection. He needs AMOXICILLIN 500 mg 2 po BID for 10 days and Biaxin 500 mg po bid for 10 days. TAKE PROTONIX BID . DO NOT TAKE LIPITOR WHILE TAKING THE ANTIBIOTICS. Med side effects include NVD, abd pain, and metallic taste.  OPV IN MAY 2021. Dx: PUD/COLON CANCER SCREENING.

## 2019-11-01 ENCOUNTER — Telehealth: Payer: Self-pay | Admitting: Gastroenterology

## 2019-11-01 DIAGNOSIS — I10 Essential (primary) hypertension: Secondary | ICD-10-CM | POA: Diagnosis not present

## 2019-11-01 DIAGNOSIS — Z09 Encounter for follow-up examination after completed treatment for conditions other than malignant neoplasm: Secondary | ICD-10-CM | POA: Diagnosis not present

## 2019-11-01 DIAGNOSIS — G4700 Insomnia, unspecified: Secondary | ICD-10-CM | POA: Diagnosis not present

## 2019-11-01 DIAGNOSIS — D509 Iron deficiency anemia, unspecified: Secondary | ICD-10-CM | POA: Diagnosis not present

## 2019-11-01 DIAGNOSIS — E1165 Type 2 diabetes mellitus with hyperglycemia: Secondary | ICD-10-CM | POA: Diagnosis not present

## 2019-11-01 NOTE — Telephone Encounter (Signed)
Documentation in other phone note.

## 2019-11-01 NOTE — Telephone Encounter (Signed)
OV MADE °

## 2019-11-01 NOTE — Telephone Encounter (Signed)
Pt returned call. Pt notified of results. Pt has picked up his medication.

## 2019-11-01 NOTE — Telephone Encounter (Signed)
Pt was returning a call from DS. I told him DS was not here today and would let her be aware that he had called. He also said that he requested a video or pictures of his procedure and wanted SF to be aware of that as well. [phone]

## 2019-11-02 ENCOUNTER — Other Ambulatory Visit (HOSPITAL_COMMUNITY): Payer: Self-pay | Admitting: Internal Medicine

## 2019-11-02 ENCOUNTER — Other Ambulatory Visit: Payer: Self-pay | Admitting: Internal Medicine

## 2019-11-02 DIAGNOSIS — I739 Peripheral vascular disease, unspecified: Secondary | ICD-10-CM

## 2019-11-06 ENCOUNTER — Other Ambulatory Visit: Payer: Self-pay | Admitting: *Deleted

## 2019-11-06 NOTE — Patient Outreach (Signed)
[facility]) Care Management  11/06/2019  Gregory Alvarado Feb 10, 1951 [id number]    EMMI-GENERAL DISCHARGE-RESOLVED RED ON EMMI ALERT Day # 4 Date: 11/03/2019 Red Alert Reason: Lost of interest  Outreach #1 RN spoke with pt concerning the above EMMI. Pt clarifies and indicated no lost of interest. States it was a lack of energy. Pt denies any depression or again lost of interest in things however reports he is still having some bleeding from the recent endoscopy. States his stools remain "dark". Pending a colonoscopy to find the source of his dark stools but still weak with lack of energy. No other source of bleeding and pt is no in an acute state at this time. No other needs or issues at to address via [facility] RN case manager. Pt has not contacted his provider with this information. RN offered to communicate this information to his GI provider (receptive). RN send message to Dr. Oneida Alar concerning the above complaint from pt and requested an outreach to pt for possible intervention.   Plan: Will close this EMMI with no other needs to address at this time as RN has notified pt's provider with the above information and encourage pt to contact this provider if no response over the next 24 hrs. Pt very appreciative and grateful for the assistance. Pt feels he does not need a followed call from this RN case manager concerning this matter at this time. No other issues or needs at this time.  Raina Mina, RN Care Management Coordinator [place] Office [phone]

## 2019-11-08 DIAGNOSIS — M545 Low back pain: Secondary | ICD-10-CM | POA: Diagnosis not present

## 2019-11-08 DIAGNOSIS — R600 Localized edema: Secondary | ICD-10-CM | POA: Diagnosis not present

## 2019-11-08 DIAGNOSIS — R944 Abnormal results of kidney function studies: Secondary | ICD-10-CM | POA: Diagnosis not present

## 2019-11-08 DIAGNOSIS — Z720 Tobacco use: Secondary | ICD-10-CM | POA: Diagnosis not present

## 2019-11-08 DIAGNOSIS — G4700 Insomnia, unspecified: Secondary | ICD-10-CM | POA: Diagnosis not present

## 2019-11-08 DIAGNOSIS — I10 Essential (primary) hypertension: Secondary | ICD-10-CM | POA: Diagnosis not present

## 2019-11-08 DIAGNOSIS — D72829 Elevated white blood cell count, unspecified: Secondary | ICD-10-CM | POA: Diagnosis not present

## 2019-11-08 DIAGNOSIS — R062 Wheezing: Secondary | ICD-10-CM | POA: Diagnosis not present

## 2019-11-08 DIAGNOSIS — D519 Vitamin B12 deficiency anemia, unspecified: Secondary | ICD-10-CM | POA: Diagnosis not present

## 2019-11-08 DIAGNOSIS — K922 Gastrointestinal hemorrhage, unspecified: Secondary | ICD-10-CM | POA: Diagnosis not present

## 2019-11-08 DIAGNOSIS — D649 Anemia, unspecified: Secondary | ICD-10-CM | POA: Diagnosis not present

## 2019-11-08 DIAGNOSIS — E1165 Type 2 diabetes mellitus with hyperglycemia: Secondary | ICD-10-CM | POA: Diagnosis not present

## 2019-11-08 DIAGNOSIS — Z712 Person consulting for explanation of examination or test findings: Secondary | ICD-10-CM | POA: Diagnosis not present

## 2019-11-08 DIAGNOSIS — D509 Iron deficiency anemia, unspecified: Secondary | ICD-10-CM | POA: Diagnosis not present

## 2019-11-13 NOTE — Telephone Encounter (Signed)
Report printed and will mail.

## 2019-11-14 NOTE — Telephone Encounter (Signed)
REVIEWED-NO ADDITIONAL RECOMMENDATIONS. 

## 2019-11-14 NOTE — Telephone Encounter (Signed)
Mailed report to pt.

## 2019-11-15 DIAGNOSIS — E1165 Type 2 diabetes mellitus with hyperglycemia: Secondary | ICD-10-CM | POA: Diagnosis not present

## 2019-11-15 DIAGNOSIS — D509 Iron deficiency anemia, unspecified: Secondary | ICD-10-CM | POA: Diagnosis not present

## 2019-11-15 DIAGNOSIS — G4700 Insomnia, unspecified: Secondary | ICD-10-CM | POA: Diagnosis not present

## 2019-11-15 DIAGNOSIS — I10 Essential (primary) hypertension: Secondary | ICD-10-CM | POA: Diagnosis not present

## 2019-11-15 DIAGNOSIS — D519 Vitamin B12 deficiency anemia, unspecified: Secondary | ICD-10-CM | POA: Diagnosis not present

## 2019-11-20 DIAGNOSIS — E1165 Type 2 diabetes mellitus with hyperglycemia: Secondary | ICD-10-CM | POA: Diagnosis not present

## 2019-11-20 DIAGNOSIS — I10 Essential (primary) hypertension: Secondary | ICD-10-CM | POA: Diagnosis not present

## 2019-11-20 DIAGNOSIS — J449 Chronic obstructive pulmonary disease, unspecified: Secondary | ICD-10-CM | POA: Diagnosis not present

## 2019-11-20 DIAGNOSIS — E782 Mixed hyperlipidemia: Secondary | ICD-10-CM | POA: Diagnosis not present

## 2019-11-20 DIAGNOSIS — D509 Iron deficiency anemia, unspecified: Secondary | ICD-10-CM | POA: Diagnosis not present

## 2019-12-06 DIAGNOSIS — K922 Gastrointestinal hemorrhage, unspecified: Secondary | ICD-10-CM | POA: Diagnosis not present

## 2019-12-06 DIAGNOSIS — E1165 Type 2 diabetes mellitus with hyperglycemia: Secondary | ICD-10-CM | POA: Diagnosis not present

## 2019-12-06 DIAGNOSIS — D509 Iron deficiency anemia, unspecified: Secondary | ICD-10-CM | POA: Diagnosis not present

## 2019-12-06 DIAGNOSIS — I10 Essential (primary) hypertension: Secondary | ICD-10-CM | POA: Diagnosis not present

## 2019-12-06 DIAGNOSIS — G4700 Insomnia, unspecified: Secondary | ICD-10-CM | POA: Diagnosis not present

## 2019-12-06 DIAGNOSIS — D649 Anemia, unspecified: Secondary | ICD-10-CM | POA: Diagnosis not present

## 2019-12-06 DIAGNOSIS — D519 Vitamin B12 deficiency anemia, unspecified: Secondary | ICD-10-CM | POA: Diagnosis not present

## 2019-12-06 DIAGNOSIS — I509 Heart failure, unspecified: Secondary | ICD-10-CM | POA: Diagnosis not present

## 2019-12-06 DIAGNOSIS — D72829 Elevated white blood cell count, unspecified: Secondary | ICD-10-CM | POA: Diagnosis not present

## 2020-01-03 DIAGNOSIS — I10 Essential (primary) hypertension: Secondary | ICD-10-CM | POA: Diagnosis not present

## 2020-01-03 DIAGNOSIS — E1165 Type 2 diabetes mellitus with hyperglycemia: Secondary | ICD-10-CM | POA: Diagnosis not present

## 2020-01-03 DIAGNOSIS — J449 Chronic obstructive pulmonary disease, unspecified: Secondary | ICD-10-CM | POA: Diagnosis not present

## 2020-01-03 DIAGNOSIS — D509 Iron deficiency anemia, unspecified: Secondary | ICD-10-CM | POA: Diagnosis not present

## 2020-01-03 DIAGNOSIS — D519 Vitamin B12 deficiency anemia, unspecified: Secondary | ICD-10-CM | POA: Diagnosis not present

## 2020-01-03 DIAGNOSIS — G4700 Insomnia, unspecified: Secondary | ICD-10-CM | POA: Diagnosis not present

## 2020-01-03 DIAGNOSIS — E782 Mixed hyperlipidemia: Secondary | ICD-10-CM | POA: Diagnosis not present

## 2020-01-03 DIAGNOSIS — K922 Gastrointestinal hemorrhage, unspecified: Secondary | ICD-10-CM | POA: Diagnosis not present

## 2020-01-05 DIAGNOSIS — J449 Chronic obstructive pulmonary disease, unspecified: Secondary | ICD-10-CM | POA: Diagnosis not present

## 2020-01-22 NOTE — Progress Notes (Signed)
Primary Care Physician:  Celene Squibb, MD Primary GI:  Garfield Cornea, MD    Patient Location: Home  Provider Location: [facility] office  Reason for Phone Visit:  Chief Complaint  Patient presents with   PUD/COLON CANCER SCREENING    f/u. Doing okay. no concerns. needs TCS/EGD     Persons present on the phone encounter, with roles: Patient, myself (provider),Mindy Estudillo CMA (updated meds and allergies)  Total time (minutes) spent on medical discussion: 15 minutes  Due to COVID-19, visit was conducted using telephonic method (no video was available).  Visit was requested by patient.  Virtual Visit via Telephone only  I connected with Mr. Arcos on 01/23/20 at  9:00 AM EDT by telephone and verified that I am speaking with the correct person using two identifiers.   I discussed the limitations, risks, security and privacy concerns of performing an evaluation and management service by telephone and the availability of in person appointments. I also discussed with the patient that there may be a patient responsible charge related to this service. The patient expressed understanding and agreed to proceed.   HPI:   Patient is a pleasant 69 y/o male presents for telephone visit regarding PUD, need for surveillance EGD and screening colonoscopy.  Patient here to consider colon cancer screening and for follow up of PUD/schedule surveillance EGD. He was seen while inpatient back in 10/2019 for symptomatic anemia with intermittent melena. Hgb was 5.4, ferritin 4, folate 3.7. Patient had EGD 10/2019 with multiple cratered gastric ulcers, gastritis/duodenitis. Path showed H.pylori, and patient was treated with PPI BID, amoxicillin, biaxin. At time of discharge his Hgb was 8.4. He was also noted to take 4 BC powders daily without PPI.   Last colonoscopy prior to colon resection in the 1990s.  Patient feels good.  BM every 2-3 days is his normal. Miralax did not seem to help. If gets bad, then  prune juice, which helps.  And then "I am good for a month".  Sometimes stools hard.  Does not feel he needs a daily bowel regimen.  We will plan to use prune juice more regularly.  No melena, brbpr. Stools are very dark. Admits to taking two Alka Seltzer plus in the past two months. No other ASA products. No heartburn, but if has it it is usually food related. No n/v. Sometimes feels a "knot" in the throat while eating and has to wait until goes down.  Recent esophageal dilation did not seem to help much.  Tramadol once per day.  Has chronic back pain.  If he does too much he hurts.  History of COPD.  Has oxygen at home but does not use on a regular basis.  Current Outpatient Medications  Medication Sig Dispense Refill   acetaminophen (TYLENOL) 325 MG tablet Take 2 tablets (650 mg total) by mouth every 6 (six) hours as needed for mild pain (or Fever >/= 101). 12 tablet 0   albuterol (VENTOLIN HFA) 108 (90 Base) MCG/ACT inhaler Inhale 2 puffs into the lungs every 6 (six) hours as needed for wheezing or shortness of breath. 18 g 0   atorvastatin (LIPITOR) 40 MG tablet Take 40 mg by mouth at bedtime.     Fluticasone-Salmeterol (ADVAIR DISKUS) 250-50 MCG/DOSE AEPB Inhale 1 puff into the lungs 2 (two) times daily. 60 each 2   folic acid (FOLVITE) 1 MG tablet Take 1 tablet (1 mg total) by mouth daily. 30 tablet 2   furosemide (LASIX) 40  MG tablet Take 40 mg by mouth every other day. As needed     insulin NPH-regular Human (NOVOLIN 70/30) (70-30) 100 UNIT/ML injection 50 units in AM and 100 units at night     Insulin Pen Needle (B-D ULTRAFINE III SHORT PEN) 31G X 8 MM MISC 1 each by Does not apply route as directed. 100 each 3   INSULIN SYRINGE .5CC/29G 29G X 1/2" 0.5 ML MISC Use to inject insulin 2 times a day 100 each 3   lisinopril (PRINIVIL,ZESTRIL) 20 MG tablet Take 1 tablet (20 mg total) by mouth daily. 30 tablet 6   ONE TOUCH ULTRA TEST test strip TEST FOUR TIMES DAILY AS DIRECTED. 150  each 5   pantoprazole (PROTONIX) 40 MG tablet Take 1 tablet (40 mg total) by mouth 2 (two) times daily before a meal. 60 tablet 2   traMADol (ULTRAM) 50 MG tablet Take 50 mg by mouth every 6 (six) hours as needed.     vitamin B-12 (CYANOCOBALAMIN) 250 MCG tablet Take 250 mcg by mouth. Once a month     zolpidem (AMBIEN) 10 MG tablet Take 5-10 mg by mouth at bedtime as needed for sleep.     No current facility-administered medications for this visit.    Past Medical History:  Diagnosis Date   Arthritis    left shoulder   COPD (chronic obstructive pulmonary disease) ([place])    Diabetes mellitus without complication ([place])    Dyslipidemia    Hypertension    Insomnia    Insomnia    Morbid obesity ([place])    Tobacco use     Past Surgical History:  Procedure Laterality Date   BIOPSY  10/28/2019   Procedure: BIOPSY;  Surgeon: Danie Binder, MD;  [facility];  Service: Endoscopy;;  gastric   COLON RESECTION  1990s?   DIVERTICULITIS   ESOPHAGOGASTRODUODENOSCOPY (EGD) WITH PROPOFOL N/A 10/28/2019   Dr. Oneida Alar: Multiple cratered gastric ulcers, gastritis/duodenitis.  Path showed H. pylori.  Patient treated with amoxicillin/Biaxin/PPI twice daily.   HERNIA REPAIR     UHR   ROTATOR CUFF REPAIR Bilateral    TYMPANOPLASTY      Family History  Problem Relation Age of Onset   Diabetes Mother    Heart attack Mother    Heart attack Father    Heart attack Brother    Colon cancer Neg Hx     Social History   Socioeconomic History   Marital status: Divorced    Spouse name: Not on file   Number of children: Not on file   Years of education: Not on file   Highest education level: Not on file  Occupational History   Not on file  Tobacco Use   Smoking status: Current Every Day Smoker    Packs/day: 2.00    Types: Cigarettes   Smokeless tobacco: Never Used  Substance and Sexual Activity   Alcohol use: Yes    Comment: rare   Drug use: No    Sexual activity: Not on file  Other Topics Concern   Not on file  Social History Narrative   Not on file   Social Determinants of Health   Financial Resource Strain:    Difficulty of Paying Living Expenses:   Food Insecurity:    Worried About [place] in the Last Year:    Human resources officer of Food in the Last Year:   Transportation Needs:    Lack of Transportation (Medical):    Lack of  Transportation (Non-Medical):   Physical Activity:    Days of Exercise per Week:    Minutes of Exercise per Session:   Stress:    Feeling of Stress :   Social Connections:    Frequency of Communication with Friends and Family:    Frequency of Social Gatherings with Friends and Family:    Attends Religious Services:    Active Member of Clubs or Organizations:    Attends Music therapist:    Marital Status:   Intimate Partner Violence:    Fear of Current or Ex-Partner:    Emotionally Abused:    Physically Abused:    Sexually Abused:       ROS:  General: Negative for anorexia, weight loss, fever, chills, fatigue, weakness. Eyes: Negative for vision changes.  ENT: Negative for hoarseness,  nasal congestion. See hpi CV: Negative for chest pain, angina, palpitations, dyspnea on exertion, peripheral edema.  Respiratory: Negative for dyspnea at rest, dyspnea on exertion, intermittent cough, sputum, wheezing.  GI: See history of present illness. GU:  Negative for dysuria, hematuria, urinary incontinence, urinary frequency, nocturnal urination.  MS: Negative for joint pain, chronic low back pain.  Derm: Negative for rash or itching.  Neuro: Negative for weakness, abnormal sensation, seizure, frequent headaches, memory loss, confusion.  Psych: Negative for anxiety, depression, suicidal ideation, hallucinations.  Endo: Negative for unusual weight change.  Heme: Negative for bruising or bleeding. Allergy: Negative for rash or hives.    Observations/Objective: Very pleasant cooperative.  No apparent distress over the phone.  Otherwise exam unavailable.   Lab Results  Component Value Date   WBC 20.5 (H) 10/29/2019   HGB 8.4 (L) 10/29/2019   HCT 31.1 (L) 10/29/2019   MCV 78.7 (L) 10/29/2019   PLT 342 10/29/2019   Lab Results  Component Value Date   CREATININE 1.38 (H) 10/29/2019   BUN 34 (H) 10/29/2019   NA 133 (L) 10/29/2019   K 5.2 (H) 10/29/2019   CL 97 (L) 10/29/2019   CO2 27 10/29/2019   Lab Results  Component Value Date   ALT 13 10/26/2019   AST 12 (L) 10/26/2019   ALKPHOS 74 10/26/2019   BILITOT 0.5 10/26/2019   Lab Results  Component Value Date   IRON 12 (L) 10/26/2019   TIBC 516 (H) 10/26/2019   FERRITIN 4 (L) 10/26/2019   Lab Results  Component Value Date   FOLATE 3.7 (L) 10/26/2019   Lab Results  Component Value Date   VITAMINB12 224 10/26/2019    Assessment and Plan: Pleasant 69 year old gentleman with history of iron deficiency anemia, profound symptomatic anemia with hospitalization back in February.  Required blood transfusion.  Found to have multiple gastric ulcers in the setting of H. pylori and ASA powder use.  Patient reports follow-up hemoglobin with PCP up in the 10 range.  We have requested records.  Clinically he is doing well from a GI standpoint.  He completed H. pylori treatment.  Continues pantoprazole twice daily.  Due for 19-month surveillance EGD for gastric ulcers.  He continues to have some solid food dysphagia, previous esophageal dilation in February did not seem to help.  Consider repeat dilation if appropriate at time of endoscopy.  Plan for deep sedation given chronic pain medication use and COPD.  I have discussed the risks, alternatives, benefits with regards to but not limited to the risk of reaction to medication, bleeding, infection, perforation and the patient is agreeable to proceed. Written consent to be obtained.  He  will continue pantoprazole 40 mg twice  daily until endoscopy, consider dropping to once daily at that time based on findings.  Colon cancer screening, remote colonoscopy in the 90s.  He has had previous partial colon resection due to complicated diverticulitis.  No family history of colon cancer.  He has mild constipation which she manages with prune juice.  He declines daily bowel regimen.  Previously did not respond to MiraLAX.  Discussed need to keep bowels regulated especially prior to colonoscopy to ensure adequate bowel prep.  We will plan for extra Dulcolax 10 mg daily for 3 days prior to prep.  He will use prune juice regularly as needed.  Plan for deep sedation given chronic pain medication and COPD.  I have discussed the risks, alternatives, benefits with regards to but not limited to the risk of reaction to medication, bleeding, infection, perforation and the patient is agreeable to proceed. Written consent to be obtained.  We will request copy of most recent labs from PCP for follow-up of anemia.   Follow Up Instructions:    I discussed the assessment and treatment plan with the patient. The patient was provided an opportunity to ask questions and all were answered. The patient agreed with the plan and demonstrated an understanding of the instructions. AVS mailed to patient's home address.   The patient was advised to call back or seek an in-person evaluation if the symptoms worsen or if the condition fails to improve as anticipated.  I provided 15 minutes of non-face-to-face time during this encounter.   Neil Crouch, PA-C

## 2020-01-23 ENCOUNTER — Other Ambulatory Visit: Payer: Self-pay

## 2020-01-23 ENCOUNTER — Encounter: Payer: Self-pay | Admitting: Gastroenterology

## 2020-01-23 ENCOUNTER — Telehealth: Payer: Self-pay | Admitting: Cardiology

## 2020-01-23 ENCOUNTER — Ambulatory Visit (INDEPENDENT_AMBULATORY_CARE_PROVIDER_SITE_OTHER): Payer: Medicare Other | Admitting: Gastroenterology

## 2020-01-23 DIAGNOSIS — K5900 Constipation, unspecified: Secondary | ICD-10-CM | POA: Diagnosis not present

## 2020-01-23 DIAGNOSIS — D500 Iron deficiency anemia secondary to blood loss (chronic): Secondary | ICD-10-CM | POA: Diagnosis not present

## 2020-01-23 DIAGNOSIS — I10 Essential (primary) hypertension: Secondary | ICD-10-CM | POA: Diagnosis not present

## 2020-01-23 DIAGNOSIS — E782 Mixed hyperlipidemia: Secondary | ICD-10-CM | POA: Diagnosis not present

## 2020-01-23 DIAGNOSIS — E1165 Type 2 diabetes mellitus with hyperglycemia: Secondary | ICD-10-CM | POA: Diagnosis not present

## 2020-01-23 DIAGNOSIS — R1319 Other dysphagia: Secondary | ICD-10-CM | POA: Insufficient documentation

## 2020-01-23 DIAGNOSIS — Z1211 Encounter for screening for malignant neoplasm of colon: Secondary | ICD-10-CM | POA: Diagnosis not present

## 2020-01-23 DIAGNOSIS — K279 Peptic ulcer, site unspecified, unspecified as acute or chronic, without hemorrhage or perforation: Secondary | ICD-10-CM | POA: Diagnosis not present

## 2020-01-23 DIAGNOSIS — D509 Iron deficiency anemia, unspecified: Secondary | ICD-10-CM | POA: Diagnosis not present

## 2020-01-23 DIAGNOSIS — A048 Other specified bacterial intestinal infections: Secondary | ICD-10-CM | POA: Insufficient documentation

## 2020-01-23 DIAGNOSIS — R1310 Dysphagia, unspecified: Secondary | ICD-10-CM

## 2020-01-23 DIAGNOSIS — J449 Chronic obstructive pulmonary disease, unspecified: Secondary | ICD-10-CM | POA: Diagnosis not present

## 2020-01-23 NOTE — Telephone Encounter (Signed)

## 2020-01-23 NOTE — Patient Instructions (Signed)
1. Continue pantoprazole twice daily until your upper endoscopy. At that time, based on findings, we may decrease to once daily. 2. Upper endoscopy and colonoscopy as scheduled. See separate instructions.  3. Continue prune juice on regular basis to keep bowels regular. It is important not to get "backed up" before your colonoscopy so we can make sure you are cleaned out enough to complete your exam.  4. I will get copy of your last labs from Dr. Nevada Crane for review.

## 2020-01-24 ENCOUNTER — Telehealth (INDEPENDENT_AMBULATORY_CARE_PROVIDER_SITE_OTHER): Payer: Medicare Other | Admitting: Cardiology

## 2020-01-24 ENCOUNTER — Encounter: Payer: Self-pay | Admitting: Cardiology

## 2020-01-24 ENCOUNTER — Encounter: Payer: Self-pay | Admitting: *Deleted

## 2020-01-24 VITALS — BP 170/96 | HR 95 | Ht 72.0 in | Wt 300.0 lb

## 2020-01-24 DIAGNOSIS — I5032 Chronic diastolic (congestive) heart failure: Secondary | ICD-10-CM

## 2020-01-24 DIAGNOSIS — I10 Essential (primary) hypertension: Secondary | ICD-10-CM

## 2020-01-24 NOTE — Progress Notes (Signed)
Virtual Visit via Telephone Note   This visit type was conducted due to national recommendations for restrictions regarding the COVID-19 Pandemic (e.g. social distancing) in an effort to limit this patient's exposure and mitigate transmission in our community.  Due to his co-morbid illnesses, this patient is at least at moderate risk for complications without adequate follow up.  This format is felt to be most appropriate for this patient at this time.  The patient did not have access to video technology/had technical difficulties with video requiring transitioning to audio format only (telephone).  All issues noted in this document were discussed and addressed.  No physical exam could be performed with this format.  Please refer to the patient's chart for his  consent to telehealth for [facility].   The patient was identified using 2 identifiers.  Date:  01/24/2020   ID:  Gregory Alvarado, DOB 1950/11/22, MRN [id number]  Patient Location: Home Provider Location: Home  PCP:  Celene Squibb, MD  Cardiologist:  Carlyle Dolly, MD  Electrophysiologist:  None   Evaluation Performed:  Follow-Up Visit  Chief Complaint:  Follow up visit  History of Present Illness:    Gregory Alvarado is a 69 y.o. male seen today for follow up of the following medical problems.  1. SOB/Leg swelling/Chronic diastolic HF - ongoing 6-8 months. Increased SOB/DOE. DOE just walking in from [employer] lot -cramps with low dose furosemide and torsemide, he has stopped taking   - 08/2018 initial echo poor visualization,suggestion LVEF 40-45%, grade I diastoilc dysfunction - 08/2018 limited contrast echo LVEF 55-60%, no WMAs.   - cramps on both daily lasix and torsemide, even with low dosing and every other day dosing. . We tried chlorthalidone but he stopped taking due to lack of benefit.  -pcp just started on HCTZ 12.5mg  daily.  - some ongoing swelling    2.Anemia - admission 10/2019 with symptomatic  anemia and GI bleed - heavy bc powder use at home at the time - Hgb 5.4 on admissoin. Given 3 units pRBCs - EGD with gastric ulcers   3. HTN - pcp started HCTZ 12.5mg  dialy - not checking home bp's regularly    The patient does not have symptoms concerning for COVID-19 infection (fever, chills, cough, or new shortness of breath).    Past Medical History:  Diagnosis Date  . Arthritis    left shoulder  . COPD (chronic obstructive pulmonary disease) ([place])   . Diabetes mellitus without complication ([place])   . Dyslipidemia   . Hypertension   . Insomnia   . Insomnia   . Morbid obesity ([place])   . Tobacco use    Past Surgical History:  Procedure Laterality Date  . BIOPSY  10/28/2019   Procedure: BIOPSY;  Surgeon: Danie Binder, MD;  [facility];  Service: Endoscopy;;  gastric  . COLON RESECTION  1990s?   DIVERTICULITIS  . ESOPHAGOGASTRODUODENOSCOPY (EGD) WITH PROPOFOL N/A 10/28/2019   Dr. Oneida Alar: Multiple cratered gastric ulcers, gastritis/duodenitis.  Path showed H. pylori.  Patient treated with amoxicillin/Biaxin/PPI twice daily.  Marland Kitchen HERNIA REPAIR     UHR  . ROTATOR CUFF REPAIR Bilateral   . TYMPANOPLASTY       No outpatient medications have been marked as taking for the 01/24/20 encounter (Appointment) with Arnoldo Lenis, MD.     Allergies:   Caduet [amlodipine-atorvastatin]   Social History   Tobacco Use  . Smoking status: Current Every Day Smoker    Packs/day: 2.00  Types: Cigarettes  . Smokeless tobacco: Never Used  Substance Use Topics  . Alcohol use: Yes    Comment: rare  . Drug use: No     Family Hx: The patient's family history includes Diabetes in his mother; Heart attack in his brother, father, and mother. There is no history of Colon cancer.  ROS:   Please see the history of present illness.     All other systems reviewed and are negative.   Prior CV studies:   The following studies were reviewed today:  08/2018 echo Study  Conclusions  - Left ventricle: The cavity size was normal. Wall thickness was increased increased in a pattern of mild to moderate LVH. Systolic function was mildly to moderately reduced. The estimated ejection fraction was in the range of 40% to 45%. Diffuse hypokinesis. Doppler parameters are consistent with abnormal left ventricular relaxation (grade 1 diastolic dysfunction). Doppler parameters are consistent with high ventricular filling pressure. - Aortic valve: Valve area (VTI): 3.22 cm^2. Valve area (Vmax): 2.74 cm^2. Valve area (Vmean): 2.29 cm^2. - Left atrium: The atrium was moderately dilated. - Atrial septum: No defect or patent foramen ovale was identified. - Techncically difficult study, recommend limited study with echocontrast to better clarify LVEF. Based on available images appears mildly decreased.  08/2018 contrast echo Study Conclusions  - Left ventricle: Limited study with Definity contrast. Systolic function was normal. The estimated ejection fraction was in the range of 55% to 60%. Wall motion was normal; there were no regional wall motion abnormalities. - Mitral valve: There was trivial regurgitation. - Pericardium, extracardiac: A prominent pericardial fat pad was present.  Labs/Other Tests and Data Reviewed:    EKG:  No ECG reviewed.  Recent Labs: 10/26/2019: ALT 13 10/29/2019: BUN 34; Creatinine, Ser 1.38; Hemoglobin 8.4; Platelets 342; Potassium 5.2; Sodium 133   Recent Lipid Panel Lab Results  Component Value Date/Time   CHOL 186 02/18/2017 07:37 AM   TRIG 329 (H) 02/18/2017 07:37 AM   HDL 28 (L) 02/18/2017 07:37 AM   LDLCALC 92 02/18/2017 07:37 AM    Wt Readings from Last 3 Encounters:  10/26/19 (!) 308 lb 3.3 oz (139.8 kg)  01/27/19 300 lb (136.1 kg)  10/13/18 (!) 306 lb (138.8 kg)     Objective:    Vital Signs:   Today's Vitals   01/24/20 0911  BP: (!) 170/96  Pulse: 95  Weight: 300 lb (136.1 kg)    Height: 6' (1.829 m)   Body mass index is 40.69 kg/m. Normal affect. Normal speech pattern and tone.Comfortbale, no apparent distress. No audible signs of sob or wheezing.   ASSESSMENT & PLAN:    1.Chronic diastolic HF - difficultly finding a diuretic regimen as outlined above due to side effects - just started on HCTZ 12.5mg  daily by pcp, follow response   2.. HTN - elevated today but by 10/2019 admission bp's were ok - just started on HCTZ by pcp, has f/u in just a few weeks, f/u bp    COVID-19 Education: The signs and symptoms of COVID-19 were discussed with the patient and how to seek care for testing (follow up with PCP or arrange E-visit).  The importance of social distancing was discussed today.  Time:   Today, I have spent 17 minutes with the patient with telehealth technology discussing the above problems.     Medication Adjustments/Labs and Tests Ordered: Current medicines are reviewed at length with the patient today.  Concerns regarding medicines are outlined above.  Tests Ordered: No orders of the defined types were placed in this encounter.   Medication Changes: No orders of the defined types were placed in this encounter.   Follow Up:  In Person in 6 month(s)  Signed, Carlyle Dolly, MD  01/24/2020 8:15 AM    [place]

## 2020-01-24 NOTE — Patient Instructions (Signed)
Medication Instructions:  Your physician recommends that you continue on your current medications as directed. Please refer to the Current Medication list given to you today.  *If you need a refill on your cardiac medications before your next appointment, please call your pharmacy*   Lab Work: NONE   If you have labs (blood work) drawn today and your tests are completely normal, you will receive your results only by: . MyChart Message (if you have MyChart) OR . A paper copy in the mail If you have any lab test that is abnormal or we need to change your treatment, we will call you to review the results.   Testing/Procedures: NONE    Follow-Up: At [facility], you and your health needs are our priority.  As part of our continuing mission to provide you with exceptional heart care, we have created designated Provider Care Teams.  These Care Teams include your primary Cardiologist (physician) and Advanced Practice Providers (APPs -  Physician Assistants and Nurse Practitioners) who all work together to provide you with the care you need, when you need it.  We recommend signing up for the patient portal called "MyChart".  Sign up information is provided on this After Visit Summary.  MyChart is used to connect with patients for Virtual Visits (Telemedicine).  Patients are able to view lab/test results, encounter notes, upcoming appointments, etc.  Non-urgent messages can be sent to your provider as well.   To learn more about what you can do with MyChart, go to [email or web address].    Your next appointment:   6 month(s)  The format for your next appointment:   In Person  Provider:   Jonathan Branch, MD   Other Instructions Thank you for choosing [facility]!    

## 2020-01-29 DIAGNOSIS — E1165 Type 2 diabetes mellitus with hyperglycemia: Secondary | ICD-10-CM | POA: Diagnosis not present

## 2020-01-29 DIAGNOSIS — D519 Vitamin B12 deficiency anemia, unspecified: Secondary | ICD-10-CM | POA: Diagnosis not present

## 2020-01-29 DIAGNOSIS — D649 Anemia, unspecified: Secondary | ICD-10-CM | POA: Diagnosis not present

## 2020-01-29 DIAGNOSIS — D72829 Elevated white blood cell count, unspecified: Secondary | ICD-10-CM | POA: Diagnosis not present

## 2020-01-29 DIAGNOSIS — D509 Iron deficiency anemia, unspecified: Secondary | ICD-10-CM | POA: Diagnosis not present

## 2020-02-01 DIAGNOSIS — K922 Gastrointestinal hemorrhage, unspecified: Secondary | ICD-10-CM | POA: Diagnosis not present

## 2020-02-01 DIAGNOSIS — D509 Iron deficiency anemia, unspecified: Secondary | ICD-10-CM | POA: Diagnosis not present

## 2020-02-01 DIAGNOSIS — D519 Vitamin B12 deficiency anemia, unspecified: Secondary | ICD-10-CM | POA: Diagnosis not present

## 2020-02-01 DIAGNOSIS — G4700 Insomnia, unspecified: Secondary | ICD-10-CM | POA: Diagnosis not present

## 2020-02-01 DIAGNOSIS — I10 Essential (primary) hypertension: Secondary | ICD-10-CM | POA: Diagnosis not present

## 2020-02-04 DIAGNOSIS — J449 Chronic obstructive pulmonary disease, unspecified: Secondary | ICD-10-CM | POA: Diagnosis not present

## 2020-02-07 ENCOUNTER — Telehealth: Payer: Self-pay | Admitting: *Deleted

## 2020-02-07 NOTE — Telephone Encounter (Signed)
Pt consented to a telephone visit on 01/23/20. 

## 2020-02-07 NOTE — Telephone Encounter (Signed)
Orlando Penner Spilde, you are scheduled for a virtual visit with your provider today.  Just as we do with appointments in the office, we must obtain your consent to participate.  Your consent will be active for this visit and any virtual visit you may have with one of our providers in the next 365 days.  If you have a MyChart account, I can also send a copy of this consent to you electronically.  All virtual visits are billed to your insurance company just like a traditional visit in the office.  As this is a virtual visit, video technology does not allow for your provider to perform a traditional examination.  This may limit your provider's ability to fully assess your condition.  If your provider identifies any concerns that need to be evaluated in person or the need to arrange testing such as labs, EKG, etc, we will make arrangements to do so.  Although advances in technology are sophisticated, we cannot ensure that it will always work on either your end or our end.  If the connection with a video visit is poor, we may have to switch to a telephone visit.  With either a video or telephone visit, we are not always able to ensure that we have a secure connection.   I need to obtain your verbal consent now.   Are you willing to proceed with your visit today?

## 2020-02-13 DIAGNOSIS — D509 Iron deficiency anemia, unspecified: Secondary | ICD-10-CM | POA: Diagnosis not present

## 2020-02-13 DIAGNOSIS — K922 Gastrointestinal hemorrhage, unspecified: Secondary | ICD-10-CM | POA: Diagnosis not present

## 2020-02-13 DIAGNOSIS — I10 Essential (primary) hypertension: Secondary | ICD-10-CM | POA: Diagnosis not present

## 2020-02-13 DIAGNOSIS — J441 Chronic obstructive pulmonary disease with (acute) exacerbation: Secondary | ICD-10-CM | POA: Diagnosis not present

## 2020-02-13 DIAGNOSIS — D519 Vitamin B12 deficiency anemia, unspecified: Secondary | ICD-10-CM | POA: Diagnosis not present

## 2020-03-04 DIAGNOSIS — M25561 Pain in right knee: Secondary | ICD-10-CM | POA: Diagnosis not present

## 2020-03-04 DIAGNOSIS — I10 Essential (primary) hypertension: Secondary | ICD-10-CM | POA: Diagnosis not present

## 2020-03-06 DIAGNOSIS — J449 Chronic obstructive pulmonary disease, unspecified: Secondary | ICD-10-CM | POA: Diagnosis not present

## 2020-03-13 ENCOUNTER — Encounter: Payer: Self-pay | Admitting: *Deleted

## 2020-03-13 ENCOUNTER — Telehealth: Payer: Self-pay | Admitting: *Deleted

## 2020-03-13 MED ORDER — NA SULFATE-K SULFATE-MG SULF 17.5-3.13-1.6 GM/177ML PO SOLN: 1.0000 | Freq: Once | ORAL | 0 refills | Status: AC

## 2020-03-13 NOTE — Telephone Encounter (Signed)
CALLED PT. TCS/EGD +/-ED with propofol scheduled for 8/2 at 7:30am. Patient aware will need pre-op/covid test appt prior. Confirmed mailing address. Will mail prep instructions with pre-op/covid test appt. Also confirmed pharmacy is [facility] Rx sent in. Orders entered.    PA for EGD/DIL approved via [facility] website. Auth# [id number] dates 04/15/2020-07/14/2020

## 2020-04-01 ENCOUNTER — Other Ambulatory Visit (HOSPITAL_COMMUNITY): Payer: Self-pay | Admitting: Internal Medicine

## 2020-04-01 ENCOUNTER — Other Ambulatory Visit: Payer: Self-pay

## 2020-04-01 ENCOUNTER — Ambulatory Visit (HOSPITAL_COMMUNITY)
Admission: RE | Admit: 2020-04-01 | Discharge: 2020-04-01 | Disposition: A | Discharge status: Home / Self Care | Payer: Medicare Other | Source: Ambulatory Visit | Attending: Internal Medicine | Admitting: Internal Medicine

## 2020-04-01 DIAGNOSIS — M25561 Pain in right knee: Secondary | ICD-10-CM

## 2020-04-02 ENCOUNTER — Ambulatory Visit: Payer: Medicare Other | Admitting: Orthopaedic Surgery

## 2020-04-02 ENCOUNTER — Encounter: Payer: Self-pay | Admitting: Orthopaedic Surgery

## 2020-04-02 VITALS — BP 183/86 | HR 102 | Ht 72.0 in | Wt 300.0 lb

## 2020-04-02 DIAGNOSIS — M25561 Pain in right knee: Secondary | ICD-10-CM

## 2020-04-02 DIAGNOSIS — Z6841 Body Mass Index (BMI) 40.0 and over, adult: Secondary | ICD-10-CM

## 2020-04-02 DIAGNOSIS — G8929 Other chronic pain: Secondary | ICD-10-CM | POA: Diagnosis not present

## 2020-04-02 NOTE — Progress Notes (Signed)
Subjective:    Patient ID: Gregory Alvarado, male    DOB: 1951-04-17, 69 y.o.   MRN: [id number]  HPI He has had pain in the right knee for over two months.  It is gradually getting worse. He has swelling and popping.  He has medial pain.  He has seen Dr. Wende Neighbors.  I have reviewed the notes.  He has had X-rays yesterday.  I have independently reviewed and interpreted x-rays of this patient done at another site by another physician or qualified health professional.  He has some giving way of the right knee.  He has taken tylenol.  It is not getting better.  He has no redness.     Review of Systems  Constitutional: Positive for activity change.  Musculoskeletal: Positive for arthralgias, gait problem and joint swelling.   For Review of Systems, all other systems reviewed and are negative.  The following is a summary of the past history medically, past history surgically, known current medicines, social history and family history.  This information is gathered electronically by the computer from prior information and documentation.  I review this each visit and have found including this information at this point in the chart is beneficial and informative.   Past Medical History:  Diagnosis Date  . Arthritis    left shoulder  . COPD (chronic obstructive pulmonary disease) ([place])   . Diabetes mellitus without complication ([place])   . Dyslipidemia   . Hypertension   . Insomnia   . Insomnia   . Morbid obesity ([place])   . Tobacco use     Past Surgical History:  Procedure Laterality Date  . BIOPSY  10/28/2019   Procedure: BIOPSY;  Surgeon: Danie Binder, MD;  Location: [facility];  Service: Endoscopy;;  gastric  . COLON RESECTION  1990s?   DIVERTICULITIS  . ESOPHAGOGASTRODUODENOSCOPY (EGD) WITH PROPOFOL N/A 10/28/2019   Dr. Oneida Alar: Multiple cratered gastric ulcers, gastritis/duodenitis.  Path showed H. pylori.  Patient treated with amoxicillin/Biaxin/PPI twice daily.  Marland Kitchen HERNIA REPAIR      UHR  . ROTATOR CUFF REPAIR Bilateral   . TYMPANOPLASTY      Current Outpatient Medications on File Prior to Visit  Medication Sig Dispense Refill  . acetaminophen (TYLENOL) 325 MG tablet Take 2 tablets (650 mg total) by mouth every 6 (six) hours as needed for mild pain (or Fever >/= 101). 12 tablet 0  . albuterol (VENTOLIN HFA) 108 (90 Base) MCG/ACT inhaler Inhale 2 puffs into the lungs every 6 (six) hours as needed for wheezing or shortness of breath. 18 g 0  . atorvastatin (LIPITOR) 40 MG tablet Take 40 mg by mouth at bedtime.    . Fluticasone-Salmeterol (ADVAIR DISKUS) 250-50 MCG/DOSE AEPB Inhale 1 puff into the lungs 2 (two) times daily. 60 each 2  . folic acid (FOLVITE) 1 MG tablet Take 1 tablet (1 mg total) by mouth daily. 30 tablet 2  . hydrochlorothiazide (HYDRODIURIL) 12.5 MG tablet Take 12.5 mg by mouth daily.    . insulin NPH-regular Human (NOVOLIN 70/30) (70-30) 100 UNIT/ML injection 50 units in AM and 100 units at night    . Insulin Pen Needle (B-D ULTRAFINE III SHORT PEN) 31G X 8 MM MISC 1 each by Does not apply route as directed. 100 each 3  . INSULIN SYRINGE .5CC/29G 29G X 1/2" 0.5 ML MISC Use to inject insulin 2 times a day 100 each 3  . lisinopril (PRINIVIL,ZESTRIL) 20 MG tablet Take 1 tablet (20  mg total) by mouth daily. 30 tablet 6  . ONE TOUCH ULTRA TEST test strip TEST FOUR TIMES DAILY AS DIRECTED. 150 each 5  . pantoprazole (PROTONIX) 40 MG tablet Take 1 tablet (40 mg total) by mouth 2 (two) times daily before a meal. 60 tablet 2  . traMADol (ULTRAM) 50 MG tablet Take 50 mg by mouth every 6 (six) hours as needed.    . vitamin B-12 (CYANOCOBALAMIN) 250 MCG tablet Take 250 mcg by mouth. Once a month    . zolpidem (AMBIEN) 10 MG tablet Take 5-10 mg by mouth at bedtime as needed for sleep.     No current facility-administered medications on file prior to visit.    Social History   Socioeconomic History  . Marital status: Divorced    Spouse name: Not on file  .  Number of children: Not on file  . Years of education: Not on file  . Highest education level: Not on file  Occupational History  . Not on file  Tobacco Use  . Smoking status: Current Every Day Smoker    Packs/day: 2.00    Types: Cigarettes  . Smokeless tobacco: Never Used  Vaping Use  . Vaping Use: Never used  Substance and Sexual Activity  . Alcohol use: Not Currently    Comment: rare  . Drug use: No  . Sexual activity: Not on file  Other Topics Concern  . Not on file  Social History Narrative  . Not on file   Social Determinants of Health   Financial Resource Strain:   . Difficulty of Paying Living Expenses:   Food Insecurity:   . Worried About Charity fundraiser in the Last Year:   . Arboriculturist in the Last Year:   Transportation Needs:   . Film/video editor (Medical):   Marland Kitchen Lack of Transportation (Non-Medical):   Physical Activity:   . Days of Exercise per Week:   . Minutes of Exercise per Session:   Stress:   . Feeling of Stress :   Social Connections:   . Frequency of Communication with Friends and Family:   . Frequency of Social Gatherings with Friends and Family:   . Attends Religious Services:   . Active Member of Clubs or Organizations:   . Attends Archivist Meetings:   Marland Kitchen Marital Status:   Intimate Partner Violence:   . Fear of Current or Ex-Partner:   . Emotionally Abused:   Marland Kitchen Physically Abused:   . Sexually Abused:     Family History  Problem Relation Age of Onset  . Diabetes Mother   . Heart attack Mother   . Heart attack Father   . Heart attack Brother   . Colon cancer Neg Hx     BP (!) 183/86   Pulse (!) 102   Ht 6' (1.829 m)   Wt 300 lb (136.1 kg)   BMI 40.69 kg/m   Body mass index is 40.69 kg/m.     Objective:   Physical Exam Vitals and nursing note reviewed.  Constitutional:      Appearance: He is well-developed.  HENT:     Head: Normocephalic and atraumatic.  Eyes:     Conjunctiva/sclera:  Conjunctivae normal.     Pupils: Pupils are equal, round, and reactive to light.  Cardiovascular:     Rate and Rhythm: Normal rate and regular rhythm.  Pulmonary:     Effort: Pulmonary effort is normal.  Abdominal:  Palpations: Abdomen is soft.  Musculoskeletal:     Cervical back: Normal range of motion and neck supple.       Legs:  Skin:    General: Skin is warm and dry.  Neurological:     Mental Status: He is alert and oriented to person, place, and time.     Cranial Nerves: No cranial nerve deficit.     Motor: No abnormal muscle tone.     Coordination: Coordination normal.     Deep Tendon Reflexes: Reflexes are normal and symmetric. Reflexes normal.  Psychiatric:        Behavior: Behavior normal.        Thought Content: Thought content normal.        Judgment: Judgment normal.           Assessment & Plan:   Encounter Diagnoses  Name Primary?  . Chronic pain of right knee Yes  . Body mass index 40.0-44.9, adult ([place])   . Morbid obesity ([place])    PROCEDURE NOTE:  The patient requests injections of the right knee , verbal consent was obtained.  The right knee was prepped appropriately after time out was performed.   Sterile technique was observed and injection of 1 cc of Depo-Medrol 40 mg with several cc's of plain xylocaine. Anesthesia was provided by ethyl chloride and a 20-gauge needle was used to inject the knee area. The injection was tolerated well.  A band aid dressing was applied.  The patient was advised to apply ice later today and tomorrow to the injection sight as needed.  I am concerned about medial meniscus tear.  I have ordered a MRI of the right knee.  Return in two weeks.  Call if any problem.  Precautions discussed.   Electronically Signed Sanjuana Kava, MD 7/20/20218:50 AM

## 2020-04-05 DIAGNOSIS — J449 Chronic obstructive pulmonary disease, unspecified: Secondary | ICD-10-CM | POA: Diagnosis not present

## 2020-04-10 NOTE — Patient Instructions (Signed)
Gregory Alvarado  04/10/2020     @PREFPERIOPPHARMACY @   Your procedure is scheduled on  04/15/2020.  Report to [name] at  [phone]  A.M.  Call this number if you have problems the morning of surgery:  [phone]   Remember:  Follow the diet and prep instructions given to you by the office.                    Take these medicines the morning of surgery with A SIP OF WATER   Protonix, tramadol(if needed). Take 1/2 of your usual night time insulin(25 units), the night before your procedure. DO NOT take any medications for diabetes the morning of your procedure. When you check your glucose the morning of your procedure and you are having symptoms because it is too high or too low, come to daysurgery immediately. Make sure you have some clear liquids with sugar in them to drink, in case your glucose gets low during your prep.   Do not wear jewelry, make-up or nail polish.  Do not wear lotions, powders, or perfumes. Please wear deodorant and brush your teeth.  Do not shave 48 hours prior to surgery.  Men may shave face and neck.  Do not bring valuables to the hospital.  [facility] is not responsible for any belongings or valuables.  Contacts, dentures or bridgework may not be worn into surgery.  Leave your suitcase in the car.  After surgery it may be brought to your room.  For patients admitted to the hospital, discharge time will be determined by your treatment team.  Patients discharged the day of surgery will not be allowed to drive home.   Name and phone number of your driver:   family Special instructions:  DO NOT smoke the morning of your procedure.  Please read over the following fact sheets that you were given. Anesthesia Post-op Instructions and Care and Recovery After Surgery       Upper Endoscopy, Adult, Care After This sheet gives you information about how to care for yourself after your procedure. Your health care provider may also give you more specific  instructions. If you have problems or questions, contact your health care provider. What can I expect after the procedure? After the procedure, it is common to have:  A sore throat.  Mild stomach pain or discomfort.  Bloating.  Nausea. Follow these instructions at home:   Follow instructions from your health care provider about what to eat or drink after your procedure.  Return to your normal activities as told by your health care provider. Ask your health care provider what activities are safe for you.  Take over-the-counter and prescription medicines only as told by your health care provider.  Do not drive for 24 hours if you were given a sedative during your procedure.  Keep all follow-up visits as told by your health care provider. This is important. Contact a health care provider if you have:  A sore throat that lasts longer than one day.  Trouble swallowing. Get help right away if:  You vomit blood or your vomit looks like coffee grounds.  You have: ? A fever. ? Bloody, black, or tarry stools. ? A severe sore throat or you cannot swallow. ? Difficulty breathing. ? Severe pain in your chest or abdomen. Summary  After the procedure, it is common to have a sore throat, mild stomach discomfort, bloating, and nausea.  Do not drive for 24 hours if you were given a sedative during the procedure.  Follow instructions from your health care provider about what to eat or drink after your procedure.  Return to your normal activities as told by your health care provider. This information is not intended to replace advice given to you by your health care provider. Make sure you discuss any questions you have with your health care provider. Document Revised: 02/22/2018 Document Reviewed: 01/31/2018 Elsevier Patient Education  [place].  Esophageal Dilatation Esophageal dilatation, also called esophageal dilation, is a procedure to widen or open (dilate) a blocked  or narrowed part of the esophagus. The esophagus is the part of the body that moves food and liquid from the mouth to the stomach. You may need this procedure if:  You have a buildup of scar tissue in your esophagus that makes it difficult, painful, or impossible to swallow. This can be caused by gastroesophageal reflux disease (GERD).  You have cancer of the esophagus.  There is a problem with how food moves through your esophagus. In some cases, you may need this procedure repeated at a later time to dilate the esophagus gradually. Tell a health care provider about:  Any allergies you have.  All medicines you are taking, including vitamins, herbs, eye drops, creams, and over-the-counter medicines.  Any problems you or family members have had with anesthetic medicines.  Any blood disorders you have.  Any surgeries you have had.  Any medical conditions you have.  Any antibiotic medicines you are required to take before dental procedures.  Whether you are pregnant or may be pregnant. What are the risks? Generally, this is a safe procedure. However, problems may occur, including:  Bleeding due to a tear in the lining of the esophagus.  A hole (perforation) in the esophagus. What happens before the procedure?  Follow instructions from your health care provider about eating or drinking restrictions.  Ask your health care provider about changing or stopping your regular medicines. This is especially important if you are taking diabetes medicines or blood thinners.  Plan to have someone take you home from the hospital or clinic.  Plan to have a responsible adult care for you for at least 24 hours after you leave the hospital or clinic. This is important. What happens during the procedure?  You may be given a medicine to help you relax (sedative).  A numbing medicine may be sprayed into the back of your throat, or you may gargle the medicine.  Your health care provider may  perform the dilatation using various surgical instruments, such as: ? Simple dilators. This instrument is carefully placed in the esophagus to stretch it. ? Guided wire bougies. This involves using an endoscope to insert a wire into the esophagus. A dilator is passed over this wire to enlarge the esophagus. Then the wire is removed. ? Balloon dilators. An endoscope with a small balloon at the end is inserted into the esophagus. The balloon is inflated to stretch the esophagus and open it up. The procedure may vary among health care providers and hospitals. What happens after the procedure?  Your blood pressure, heart rate, breathing rate, and blood oxygen level will be monitored until the medicines you were given have worn off.  Your throat may feel slightly sore and numb. This will improve slowly over time.  You will not be allowed to eat or drink until your throat is no longer numb.  When you are able  to drink, urinate, and sit on the edge of the bed without nausea or dizziness, you may be able to return home. Follow these instructions at home:  Take over-the-counter and prescription medicines only as told by your health care provider.  Do not drive for 24 hours if you were given a sedative during your procedure.  You should have a responsible adult with you for 24 hours after the procedure.  Follow instructions from your health care provider about any eating or drinking restrictions.  Do not use any products that contain nicotine or tobacco, such as cigarettes and e-cigarettes. If you need help quitting, ask your health care provider.  Keep all follow-up visits as told by your health care provider. This is important. Get help right away if you:  Have a fever.  Have chest pain.  Have pain that is not relieved by medication.  Have trouble breathing.  Have trouble swallowing.  Vomit blood. Summary  Esophageal dilatation, also called esophageal dilation, is a procedure to  widen or open (dilate) a blocked or narrowed part of the esophagus.  Plan to have someone take you home from the hospital or clinic.  For this procedure, a numbing medicine may be sprayed into the back of your throat, or you may gargle the medicine.  Do not drive for 24 hours if you were given a sedative during your procedure. This information is not intended to replace advice given to you by your health care provider. Make sure you discuss any questions you have with your health care provider. Document Revised: 06/28/2019 Document Reviewed: 07/06/2017 Elsevier Patient Education  [place].  Colonoscopy, Adult, Care After This sheet gives you information about how to care for yourself after your procedure. Your health care provider may also give you more specific instructions. If you have problems or questions, contact your health care provider. What can I expect after the procedure? After the procedure, it is common to have:  A small amount of blood in your stool for 24 hours after the procedure.  Some gas.  Mild cramping or bloating of your abdomen. Follow these instructions at home: Eating and drinking   Drink enough fluid to keep your urine pale yellow.  Follow instructions from your health care provider about eating or drinking restrictions.  Resume your normal diet as instructed by your health care provider. Avoid heavy or fried foods that are hard to digest. Activity  Rest as told by your health care provider.  Avoid sitting for a long time without moving. Get up to take short walks every 1-2 hours. This is important to improve blood flow and breathing. Ask for help if you feel weak or unsteady.  Return to your normal activities as told by your health care provider. Ask your health care provider what activities are safe for you. Managing cramping and bloating   Try walking around when you have cramps or feel bloated.  Apply heat to your abdomen as told by  your health care provider. Use the heat source that your health care provider recommends, such as a moist heat pack or a heating pad. ? Place a towel between your skin and the heat source. ? Leave the heat on for 20-30 minutes. ? Remove the heat if your skin turns bright red. This is especially important if you are unable to feel pain, heat, or cold. You may have a greater risk of getting burned. General instructions  For the first 24 hours after the procedure: ? Do  not drive or use machinery. ? Do not sign important documents. ? Do not drink alcohol. ? Do your regular daily activities at a slower pace than normal. ? Eat soft foods that are easy to digest.  Take over-the-counter and prescription medicines only as told by your health care provider.  Keep all follow-up visits as told by your health care provider. This is important. Contact a health care provider if:  You have blood in your stool 2-3 days after the procedure. Get help right away if you have:  More than a small spotting of blood in your stool.  Large blood clots in your stool.  Swelling of your abdomen.  Nausea or vomiting.  A fever.  Increasing pain in your abdomen that is not relieved with medicine. Summary  After the procedure, it is common to have a small amount of blood in your stool. You may also have mild cramping and bloating of your abdomen.  For the first 24 hours after the procedure, do not drive or use machinery, sign important documents, or drink alcohol.  Get help right away if you have a lot of blood in your stool, nausea or vomiting, a fever, or increased pain in your abdomen. This information is not intended to replace advice given to you by your health care provider. Make sure you discuss any questions you have with your health care provider. Document Revised: 03/27/2019 Document Reviewed: 03/27/2019 Elsevier Patient Education  [place] After These  instructions provide you with information about caring for yourself after your procedure. Your health care provider may also give you more specific instructions. Your treatment has been planned according to current medical practices, but problems sometimes occur. Call your health care provider if you have any problems or questions after your procedure. What can I expect after the procedure? After your procedure, you may:  Feel sleepy for several hours.  Feel clumsy and have poor balance for several hours.  Feel forgetful about what happened after the procedure.  Have poor judgment for several hours.  Feel nauseous or vomit.  Have a sore throat if you had a breathing tube during the procedure. Follow these instructions at home: For at least 24 hours after the procedure:      Have a responsible adult stay with you. It is important to have someone help care for you until you are awake and alert.  Rest as needed.  Do not: ? Participate in activities in which you could fall or become injured. ? Drive. ? Use heavy machinery. ? Drink alcohol. ? Take sleeping pills or medicines that cause drowsiness. ? Make important decisions or sign legal documents. ? Take care of children on your own. Eating and drinking  Follow the diet that is recommended by your health care provider.  If you vomit, drink water, juice, or soup when you can drink without vomiting.  Make sure you have little or no nausea before eating solid foods. General instructions  Take over-the-counter and prescription medicines only as told by your health care provider.  If you have sleep apnea, surgery and certain medicines can increase your risk for breathing problems. Follow instructions from your health care provider about wearing your sleep device: ? Anytime you are sleeping, including during daytime naps. ? While taking prescription pain medicines, sleeping medicines, or medicines that make you drowsy.  If you  smoke, do not smoke without supervision.  Keep all follow-up visits as told by your health care provider.  This is important. Contact a health care provider if:  You keep feeling nauseous or you keep vomiting.  You feel light-headed.  You develop a rash.  You have a fever. Get help right away if:  You have trouble breathing. Summary  For several hours after your procedure, you may feel sleepy and have poor judgment.  Have a responsible adult stay with you for at least 24 hours or until you are awake and alert. This information is not intended to replace advice given to you by your health care provider. Make sure you discuss any questions you have with your health care provider. Document Revised: 11/29/2017 Document Reviewed: 12/22/2015 Elsevier Patient Education  [place].

## 2020-04-12 ENCOUNTER — Other Ambulatory Visit: Payer: Self-pay

## 2020-04-12 ENCOUNTER — Other Ambulatory Visit (HOSPITAL_COMMUNITY)
Admission: RE | Admit: 2020-04-12 | Discharge: 2020-04-12 | Disposition: A | Discharge status: Home / Self Care | Payer: Medicare Other | Source: Ambulatory Visit | Attending: Internal Medicine | Admitting: Internal Medicine

## 2020-04-12 ENCOUNTER — Encounter (HOSPITAL_COMMUNITY): Payer: Self-pay

## 2020-04-12 ENCOUNTER — Encounter (HOSPITAL_COMMUNITY)
Admission: RE | Admit: 2020-04-12 | Discharge: 2020-04-12 | Disposition: A | Discharge status: Home / Self Care | Payer: Medicare Other | Source: Ambulatory Visit | Attending: Internal Medicine | Admitting: Internal Medicine

## 2020-04-12 DIAGNOSIS — Z01812 Encounter for preprocedural laboratory examination: Secondary | ICD-10-CM | POA: Insufficient documentation

## 2020-04-12 DIAGNOSIS — Z20822 Contact with and (suspected) exposure to covid-19: Secondary | ICD-10-CM | POA: Diagnosis not present

## 2020-04-12 LAB — BASIC METABOLIC PANEL
Anion gap: 11 (ref 5–15)
BUN: 20 mg/dL (ref 8–23)
CO2: 26 mmol/L (ref 22–32)
Calcium: 9.1 mg/dL (ref 8.9–10.3)
Chloride: 103 mmol/L (ref 98–111)
Creatinine, Ser: 1.22 mg/dL (ref 0.61–1.24)
GFR calc Af Amer: >60 mL/min (ref >60)
GFR calc non Af Amer: >60 mL/min (ref >60)
Glucose, Bld: 162 mg/dL — ABNORMAL HIGH (ref 70–99)
Potassium: 4.2 mmol/L (ref 3.5–5.1)
Sodium: 140 mmol/L (ref 135–145)

## 2020-04-12 LAB — SARS CORONAVIRUS 2 (TAT 6-24 HRS): SARS Coronavirus 2: NEGATIVE

## 2020-04-15 ENCOUNTER — Encounter (HOSPITAL_COMMUNITY): Payer: Self-pay | Admitting: Internal Medicine

## 2020-04-15 ENCOUNTER — Encounter (HOSPITAL_COMMUNITY)
Admission: RE | Disposition: A | Discharge status: Home / Self Care | Payer: Self-pay | Source: Home / Self Care | Attending: Internal Medicine

## 2020-04-15 ENCOUNTER — Ambulatory Visit (HOSPITAL_COMMUNITY): Payer: Medicare Other | Admitting: Anesthesiology

## 2020-04-15 ENCOUNTER — Ambulatory Visit (HOSPITAL_COMMUNITY)
Admission: RE | Admit: 2020-04-15 | Discharge: 2020-04-15 | Disposition: A | Discharge status: Home / Self Care | Payer: Medicare Other | Attending: Internal Medicine | Admitting: Internal Medicine

## 2020-04-15 DIAGNOSIS — Z9981 Dependence on supplemental oxygen: Secondary | ICD-10-CM | POA: Diagnosis not present

## 2020-04-15 DIAGNOSIS — E119 Type 2 diabetes mellitus without complications: Secondary | ICD-10-CM | POA: Diagnosis not present

## 2020-04-15 DIAGNOSIS — J449 Chronic obstructive pulmonary disease, unspecified: Secondary | ICD-10-CM | POA: Diagnosis not present

## 2020-04-15 DIAGNOSIS — B9681 Helicobacter pylori [H. pylori] as the cause of diseases classified elsewhere: Secondary | ICD-10-CM | POA: Diagnosis not present

## 2020-04-15 DIAGNOSIS — Z79899 Other long term (current) drug therapy: Secondary | ICD-10-CM | POA: Diagnosis not present

## 2020-04-15 DIAGNOSIS — Z7951 Long term (current) use of inhaled steroids: Secondary | ICD-10-CM | POA: Insufficient documentation

## 2020-04-15 DIAGNOSIS — K279 Peptic ulcer, site unspecified, unspecified as acute or chronic, without hemorrhage or perforation: Secondary | ICD-10-CM | POA: Diagnosis not present

## 2020-04-15 DIAGNOSIS — K2970 Gastritis, unspecified, without bleeding: Secondary | ICD-10-CM | POA: Insufficient documentation

## 2020-04-15 DIAGNOSIS — F17210 Nicotine dependence, cigarettes, uncomplicated: Secondary | ICD-10-CM | POA: Diagnosis not present

## 2020-04-15 DIAGNOSIS — Z8711 Personal history of peptic ulcer disease: Secondary | ICD-10-CM | POA: Diagnosis not present

## 2020-04-15 DIAGNOSIS — K2960 Other gastritis without bleeding: Secondary | ICD-10-CM | POA: Diagnosis not present

## 2020-04-15 DIAGNOSIS — D123 Benign neoplasm of transverse colon: Secondary | ICD-10-CM | POA: Diagnosis not present

## 2020-04-15 DIAGNOSIS — N289 Disorder of kidney and ureter, unspecified: Secondary | ICD-10-CM | POA: Diagnosis not present

## 2020-04-15 DIAGNOSIS — M19012 Primary osteoarthritis, left shoulder: Secondary | ICD-10-CM | POA: Diagnosis not present

## 2020-04-15 DIAGNOSIS — K635 Polyp of colon: Secondary | ICD-10-CM

## 2020-04-15 DIAGNOSIS — Z833 Family history of diabetes mellitus: Secondary | ICD-10-CM | POA: Insufficient documentation

## 2020-04-15 DIAGNOSIS — Z794 Long term (current) use of insulin: Secondary | ICD-10-CM | POA: Diagnosis not present

## 2020-04-15 DIAGNOSIS — R1310 Dysphagia, unspecified: Secondary | ICD-10-CM | POA: Insufficient documentation

## 2020-04-15 DIAGNOSIS — Z8249 Family history of ischemic heart disease and other diseases of the circulatory system: Secondary | ICD-10-CM | POA: Diagnosis not present

## 2020-04-15 DIAGNOSIS — Z1211 Encounter for screening for malignant neoplasm of colon: Secondary | ICD-10-CM | POA: Diagnosis not present

## 2020-04-15 DIAGNOSIS — I10 Essential (primary) hypertension: Secondary | ICD-10-CM | POA: Insufficient documentation

## 2020-04-15 DIAGNOSIS — G4700 Insomnia, unspecified: Secondary | ICD-10-CM | POA: Diagnosis not present

## 2020-04-15 DIAGNOSIS — K3189 Other diseases of stomach and duodenum: Secondary | ICD-10-CM | POA: Diagnosis not present

## 2020-04-15 DIAGNOSIS — E785 Hyperlipidemia, unspecified: Secondary | ICD-10-CM | POA: Insufficient documentation

## 2020-04-15 HISTORY — PX: MALONEY DILATION: SHX5535

## 2020-04-15 HISTORY — PX: ESOPHAGOGASTRODUODENOSCOPY (EGD) WITH PROPOFOL: SHX5813

## 2020-04-15 HISTORY — PX: POLYPECTOMY: SHX5525

## 2020-04-15 HISTORY — PX: COLONOSCOPY WITH PROPOFOL: SHX5780

## 2020-04-15 HISTORY — PX: BIOPSY: SHX5522

## 2020-04-15 LAB — GLUCOSE, CAPILLARY
Glucose-Capillary: 98 mg/dL (ref 70–99)
Glucose-Capillary: 141 mg/dL — ABNORMAL HIGH (ref 70–99)

## 2020-04-15 SURGERY — COLONOSCOPY WITH PROPOFOL
Anesthesia: General

## 2020-04-15 MED ORDER — LIDOCAINE VISCOUS HCL 2 % MT SOLN
OROMUCOSAL | Status: AC
  Filled 2020-04-15: qty 15

## 2020-04-15 MED ORDER — IPRATROPIUM-ALBUTEROL 0.5-2.5 (3) MG/3ML IN SOLN
3.0000 mL | Freq: Once | RESPIRATORY_TRACT | Status: AC
  Administered 2020-04-15: 3 mL via RESPIRATORY_TRACT

## 2020-04-15 MED ORDER — DEXAMETHASONE SODIUM PHOSPHATE 4 MG/ML IJ SOLN
INTRAMUSCULAR | Status: AC
  Filled 2020-04-15: qty 2

## 2020-04-15 MED ORDER — KETAMINE HCL 50 MG/5ML IJ SOSY
PREFILLED_SYRINGE | INTRAMUSCULAR | Status: AC
  Filled 2020-04-15: qty 5

## 2020-04-15 MED ORDER — ESMOLOL HCL 100 MG/10ML IV SOLN
INTRAVENOUS | Status: DC | PRN
Start: 2020-04-15 — End: 2020-04-15
  Administered 2020-04-15: 50 mg via INTRAVENOUS

## 2020-04-15 MED ORDER — METOPROLOL TARTRATE 5 MG/5ML IV SOLN
INTRAVENOUS | Status: DC | PRN
  Administered 2020-04-15 (×2): 2.5 mg via INTRAVENOUS
  Administered 2020-04-15: 5 mg via INTRAVENOUS

## 2020-04-15 MED ORDER — PHENYLEPHRINE 40 MCG/ML (10ML) SYRINGE FOR IV PUSH (FOR BLOOD PRESSURE SUPPORT)
PREFILLED_SYRINGE | INTRAVENOUS | Status: AC
  Filled 2020-04-15: qty 20

## 2020-04-15 MED ORDER — PROPOFOL 10 MG/ML IV BOLUS
INTRAVENOUS | Status: AC
  Filled 2020-04-15: qty 80

## 2020-04-15 MED ORDER — LACTATED RINGERS IV SOLN: Freq: Once | INTRAVENOUS | Status: AC

## 2020-04-15 MED ORDER — ONDANSETRON HCL 4 MG/2ML IJ SOLN
INTRAMUSCULAR | Status: AC
  Filled 2020-04-15: qty 2

## 2020-04-15 MED ORDER — IPRATROPIUM-ALBUTEROL 0.5-2.5 (3) MG/3ML IN SOLN
RESPIRATORY_TRACT | Status: AC
  Filled 2020-04-15: qty 3

## 2020-04-15 MED ORDER — GLYCOPYRROLATE 0.2 MG/ML IJ SOLN
0.2000 mg | Freq: Once | INTRAMUSCULAR | Status: AC
  Administered 2020-04-15: 0.2 mg via INTRAVENOUS
  Filled 2020-04-15: qty 1

## 2020-04-15 MED ORDER — KETAMINE HCL 10 MG/ML IJ SOLN
INTRAMUSCULAR | Status: DC | PRN
  Administered 2020-04-15: 20 mg via INTRAVENOUS

## 2020-04-15 MED ORDER — PROPOFOL 10 MG/ML IV BOLUS
INTRAVENOUS | Status: DC | PRN
  Administered 2020-04-15: 20 mg via INTRAVENOUS
  Administered 2020-04-15: 50 mg via INTRAVENOUS
  Administered 2020-04-15: 25 mg via INTRAVENOUS
  Administered 2020-04-15: 40 mg via INTRAVENOUS
  Administered 2020-04-15 (×3): 20 mg via INTRAVENOUS

## 2020-04-15 MED ORDER — LIDOCAINE VISCOUS HCL 2 % MT SOLN
15.0000 mL | Freq: Once | OROMUCOSAL | Status: AC
  Administered 2020-04-15: 15 mL via OROMUCOSAL

## 2020-04-15 MED ORDER — ESMOLOL HCL 100 MG/10ML IV SOLN
INTRAVENOUS | Status: AC
  Filled 2020-04-15: qty 20

## 2020-04-15 MED ORDER — GLYCOPYRROLATE PF 0.2 MG/ML IJ SOSY
PREFILLED_SYRINGE | INTRAMUSCULAR | Status: AC
  Filled 2020-04-15: qty 2

## 2020-04-15 MED ORDER — PROPOFOL 500 MG/50ML IV EMUL
INTRAVENOUS | Status: DC | PRN
  Administered 2020-04-15: 100 ug/kg/min via INTRAVENOUS

## 2020-04-15 MED ORDER — METOPROLOL TARTRATE 5 MG/5ML IV SOLN
INTRAVENOUS | Status: AC
  Filled 2020-04-15: qty 10

## 2020-04-15 MED ORDER — ETOMIDATE 2 MG/ML IV SOLN
INTRAVENOUS | Status: AC
  Filled 2020-04-15: qty 200

## 2020-04-15 NOTE — Discharge Instructions (Signed)
Colonoscopy Discharge Instructions  Read the instructions outlined below and refer to this sheet in the next few weeks. These discharge instructions provide you with general information on caring for yourself after you leave the hospital. Your doctor may also give you specific instructions. While your treatment has been planned according to the most current medical practices available, unavoidable complications occasionally occur. If you have any problems or questions after discharge, call Dr. Gala Romney at [phone]. ACTIVITY  You may resume your regular activity, but move at a slower pace for the next 24 hours.   Take frequent rest periods for the next 24 hours.   Walking will help get rid of the air and reduce the bloated feeling in your belly (abdomen).   No driving for 24 hours (because of the medicine (anesthesia) used during the test).    Do not sign any important legal documents or operate any machinery for 24 hours (because of the anesthesia used during the test).  NUTRITION  Drink plenty of fluids.   You may resume your normal diet as instructed by your doctor.   Begin with a light meal and progress to your normal diet. Heavy or fried foods are harder to digest and may make you feel sick to your stomach (nauseated).   Avoid alcoholic beverages for 24 hours or as instructed.  MEDICATIONS  You may resume your normal medications unless your doctor tells you otherwise.  WHAT YOU CAN EXPECT TODAY  Some feelings of bloating in the abdomen.   Passage of more gas than usual.   Spotting of blood in your stool or on the toilet paper.  IF YOU HAD POLYPS REMOVED DURING THE COLONOSCOPY:  No aspirin products for 7 days or as instructed.   No alcohol for 7 days or as instructed.   Eat a soft diet for the next 24 hours.  FINDING OUT THE RESULTS OF YOUR TEST Not all test results are available during your visit. If your test results are not back during the visit, make an appointment  with your caregiver to find out the results. Do not assume everything is normal if you have not heard from your caregiver or the medical facility. It is important for you to follow up on all of your test results.  SEEK IMMEDIATE MEDICAL ATTENTION IF:  You have more than a spotting of blood in your stool.   Your belly is swollen (abdominal distention).   You are nauseated or vomiting.   You have a temperature over 101.   You have abdominal pain or discomfort that is severe or gets worse throughout the day.    EGD Discharge instructions Please read the instructions outlined below and refer to this sheet in the next few weeks. These discharge instructions provide you with general information on caring for yourself after you leave the hospital. Your doctor may also give you specific instructions. While your treatment has been planned according to the most current medical practices available, unavoidable complications occasionally occur. If you have any problems or questions after discharge, please call your doctor. ACTIVITY  You may resume your regular activity but move at a slower pace for the next 24 hours.   Take frequent rest periods for the next 24 hours.   Walking will help expel (get rid of) the air and reduce the bloated feeling in your abdomen.   No driving for 24 hours (because of the anesthesia (medicine) used during the test).   You may shower.   Do not sign  any important legal documents or operate any machinery for 24 hours (because of the anesthesia used during the test).  NUTRITION  Drink plenty of fluids.   You may resume your normal diet.   Begin with a light meal and progress to your normal diet.   Avoid alcoholic beverages for 24 hours or as instructed by your caregiver.  MEDICATIONS  You may resume your normal medications unless your caregiver tells you otherwise.  WHAT YOU CAN EXPECT TODAY  You may experience abdominal discomfort such as a feeling of  fullness or "gas" pains.  FOLLOW-UP  Your doctor will discuss the results of your test with you.  SEEK IMMEDIATE MEDICAL ATTENTION IF ANY OF THE FOLLOWING OCCUR:  Excessive nausea (feeling sick to your stomach) and/or vomiting.   Severe abdominal pain and distention (swelling).   Trouble swallowing.   Temperature over 101 F (37.8 C).  Rectal bleeding or vomiting of blood.    Colon Polyps  Polyps are tissue growths inside the body. Polyps can grow in many places, including the large intestine (colon). A polyp may be a round bump or a mushroom-shaped growth. You could have one polyp or several. Most colon polyps are noncancerous (benign). However, some colon polyps can become cancerous over time. Finding and removing the polyps early can help prevent this. What are the causes? The exact cause of colon polyps is not known. What increases the risk? You are more likely to develop this condition if you: Have a family history of colon cancer or colon polyps. Are older than 24 or older than 45 if you are African American. Have inflammatory bowel disease, such as ulcerative colitis or Crohn's disease. Have certain hereditary conditions, such as: Familial adenomatous polyposis. Lynch syndrome. Turcot syndrome. Peutz-Jeghers syndrome. Are overweight. Smoke cigarettes. Do not get enough exercise. Drink too much alcohol. Eat a diet that is high in fat and red meat and low in fiber. Had childhood cancer that was treated with abdominal radiation. What are the signs or symptoms? Most polyps do not cause symptoms. If you have symptoms, they may include: Blood coming from your rectum when having a bowel movement. Blood in your stool. The stool may look dark red or black. Abdominal pain. A change in bowel habits, such as constipation or diarrhea. How is this diagnosed? This condition is diagnosed with a colonoscopy. This is a procedure in which a lighted, flexible scope is inserted into  the anus and then passed into the colon to examine the area. Polyps are sometimes found when a colonoscopy is done as part of routine cancer screening tests. How is this treated? Treatment for this condition involves removing any polyps that are found. Most polyps can be removed during a colonoscopy. Those polyps will then be tested for cancer. Additional treatment may be needed depending on the results of testing. Follow these instructions at home: Lifestyle Maintain a healthy weight, or lose weight if recommended by your health care provider. Exercise every day or as told by your health care provider. Do not use any products that contain nicotine or tobacco, such as cigarettes and e-cigarettes. If you need help quitting, ask your health care provider. If you drink alcohol, limit how much you have: 0-1 drink a day for women. 0-2 drinks a day for men. Be aware of how much alcohol is in your drink. In the [place] one drink equals one 12 oz bottle of beer (355 mL), one 5 oz glass of wine (148 mL), or one 1  oz shot of hard liquor (44 mL). Eating and drinking  Eat foods that are high in fiber, such as fruits, vegetables, and whole grains. Eat foods that are high in calcium and vitamin D, such as milk, cheese, yogurt, eggs, liver, fish, and broccoli. Limit foods that are high in fat, such as fried foods and desserts. Limit the amount of red meat and processed meat you eat, such as hot dogs, sausage, bacon, and lunch meats. General instructions Keep all follow-up visits as told by your health care provider. This is important. This includes having regularly scheduled colonoscopies. Talk to your health care provider about when you need a colonoscopy. Contact a health care provider if: You have new or worsening bleeding during a bowel movement. You have new or increased blood in your stool. You have a change in bowel habits. You lose weight for no known reason. Summary Polyps are tissue growths  inside the body. Polyps can grow in many places, including the colon. Most colon polyps are noncancerous (benign), but some can become cancerous over time. This condition is diagnosed with a colonoscopy. Treatment for this condition involves removing any polyps that are found. Most polyps can be removed during a colonoscopy. This information is not intended to replace advice given to you by your health care provider. Make sure you discuss any questions you have with your health care provider. Document Revised: 12/16/2017 Document Reviewed: 12/16/2017 Elsevier Patient Education  [place] esophagus was dilated today  Biopsies of your stomach were taken.  No ulcer seen.  Polyps removed from your colon today.  Your prep was poor.  Office visit with [place] in 3 months  Further recommendations to follow pending review of pathology report  At patient request, I called Olivia Mackie at [phone] and reviewed findings and recommendations

## 2020-04-15 NOTE — Op Note (Addendum)
[facility] Patient Name: Gregory Alvarado Procedure Date: 04/15/2020 7:13 AM MRN: [id number] Date of Birth: October 08, 1950 Attending MD: Norvel Richards , MD CSN: [id number] Age: 69 Admit Type: Outpatient Procedure:                Upper GI endoscopy Indications:              Dysphagia, Peptic ulcer Providers:                Norvel Richards, MD, Jeanann Lewandowsky. Sharon Seller, RN,                            Crystal Page, Nelma Rothman, Technician Referring MD:             Delphina Cahill, MD Medicines:                Propofol per Anesthesia Complications:            No immediate complications. Please note patient                            easily desaturated throughout the procedure. This                            was attended to successfully by the anesthesia                            staff. Estimated Blood Loss:     Estimated blood loss was minimal. Estimated blood                            loss: none. Procedure:                Pre-Anesthesia Assessment:                           - Prior to the procedure, a History and Physical                            was performed, and patient medications and                            allergies were reviewed. The patient's tolerance of                            previous anesthesia was also reviewed. The risks                            and benefits of the procedure and the sedation                            options and risks were discussed with the patient.                            All questions were answered, and informed consent  was obtained. Prior Anticoagulants: The patient has                            taken no previous anticoagulant or antiplatelet                            agents. ASA Grade Assessment: III - A patient with                            severe systemic disease. After reviewing the risks                            and benefits, the patient was deemed in                            satisfactory condition to  undergo the procedure.                           After obtaining informed consent, the endoscope was                            passed under direct vision. Throughout the                            procedure, the patient's blood pressure, pulse, and                            oxygen saturations were monitored continuously. The                            [id number]-[id number] ([id number]) scope was introduced through the                            mouth, and advanced to the second part of duodenum.                            The upper GI endoscopy was accomplished without                            difficulty. The patient tolerated the procedure                            well. Scope In: 0:16:01 AM Scope Out: 8:11:31 AM Total Procedure Duration: 0 hours 24 minutes 53 seconds  Findings:      The examined esophagus was normal.      Localized moderately erythematous mucosa was found in the stomach.       Previously noted gastric ulcer is completely healed.      The duodenal bulb and second portion of the duodenum were normal. The       scope was withdrawn. Dilation was performed with a Maloney dilator with       mild resistance at 56 Fr. The scope was withdrawn. Dilation was       performed with a Maloney dilator with mild resistance at 60 Fr.  The       dilation site was examined following endoscope reinsertion and showed no       change. Estimated blood loss: none. Finally, the abnormal gastric mucosa       was biopsied with a cold forceps for histology. Estimated blood loss was       minimal. Impression:               - Normal esophagus. Status post dilation.                           -Patchy gastric erythema?status post biopsy to                            document eradication of H. pylori. Previously noted                            gastric ulcers completely healed.                           - Normal duodenal bulb and second portion of the                            duodenum. Moderate Sedation:       Moderate (conscious) sedation was personally administered by an       anesthesia professional. The following parameters were monitored: oxygen       saturation, heart rate, blood pressure, respiratory rate, EKG, adequacy       of pulmonary ventilation, and response to care. Recommendation:           - Patient has a contact number available for                            emergencies. The signs and symptoms of potential                            delayed complications were discussed with the                            patient. Return to normal activities tomorrow.                            Written discharge instructions were provided to the                            patient.                           - Advance diet as tolerated.                           - Continue present medications. Follow-up on                            pathology. Office visit with [place] in 3 months. See  colonoscopy report. Procedure Code(s):        --- Professional ---                           [phone], Esophagogastroduodenoscopy, flexible,                            transoral; with biopsy, single or multiple                           43450, Dilation of esophagus, by unguided sound or                            bougie, single or multiple passes Diagnosis Code(s):        --- Professional ---                           K31.89, Other diseases of stomach and duodenum                           R13.10, Dysphagia, unspecified                           K27.9, Peptic ulcer, site unspecified, unspecified                            as acute or chronic, without hemorrhage or                            perforation CPT copyright 2019 American Medical Association. All rights reserved. The codes documented in this report are preliminary and upon coder review may  be revised to meet current compliance requirements. Cristopher Estimable. Zan Triska, MD Norvel Richards, MD 04/15/2020 8:50:39 AM This report has been signed  electronically. Number of Addenda: 0

## 2020-04-15 NOTE — Anesthesia Procedure Notes (Signed)
Procedure Name: MAC Date/Time: 04/15/2020 9:37 AM Performed by: Vista Deck, CRNA Pre-anesthesia Checklist: Patient identified, Emergency Drugs available, Suction available, Timeout performed and Patient being monitored Patient Re-evaluated:Patient Re-evaluated prior to induction Oxygen Delivery Method: Non-rebreather mask

## 2020-04-15 NOTE — Anesthesia Postprocedure Evaluation (Signed)
Anesthesia Post Note  Patient: Gregory Alvarado  Procedure(s) Performed: COLONOSCOPY WITH PROPOFOL (N/A ) ESOPHAGOGASTRODUODENOSCOPY (EGD) WITH PROPOFOL (N/A ) MALONEY DILATION (N/A ) BIOPSY POLYPECTOMY  Patient location during evaluation: Phase II Anesthesia Type: General Level of consciousness: awake and alert and patient cooperative Pain management: satisfactory to patient Vital Signs Assessment: post-procedure vital signs reviewed and stable Respiratory status: spontaneous breathing Cardiovascular status: stable Postop Assessment: no apparent nausea or vomiting Anesthetic complications: no   No complications documented.   Last Vitals:  Vitals:   04/15/20 1013 04/15/20 1019  BP:  (!) 135/73  Pulse:  84  Resp:  16  Temp:  36.6 C  SpO2: 90% 97%    Last Pain:  Vitals:   04/15/20 1019  TempSrc: Oral  PainSc:                  Drucie Opitz

## 2020-04-15 NOTE — Op Note (Signed)
[facility] Patient Name: Gregory Alvarado Procedure Date: 04/15/2020 8:14 AM MRN: [id number] Date of Birth: 03/16/1951 Attending MD: Norvel Richards , MD CSN: [id number] Age: 69 Admit Type: Outpatient Procedure:                Colonoscopy Indications:              Screening for colorectal malignant neoplasm Providers:                Norvel Richards, MD, Jeanann Lewandowsky. Sharon Seller, RN,                            Crystal Page, Nelma Rothman, Technician Referring MD:              Medicines:                Propofol per Anesthesia Complications:            No immediate complications. Estimated Blood Loss:     Estimated blood loss was minimal. Estimated blood                            loss was minimal. Procedure:                Pre-Anesthesia Assessment:                           - Prior to the procedure, a History and Physical                            was performed, and patient medications and                            allergies were reviewed. The patient's tolerance of                            previous anesthesia was also reviewed. The risks                            and benefits of the procedure and the sedation                            options and risks were discussed with the patient.                            All questions were answered, and informed consent                            was obtained. Prior Anticoagulants: The patient has                            taken no previous anticoagulant or antiplatelet                            agents. ASA Grade Assessment: III - A patient with  severe systemic disease. After reviewing the risks                            and benefits, the patient was deemed in                            satisfactory condition to undergo the procedure.                           After obtaining informed consent, the colonoscope                            was passed under direct vision. Throughout the                             procedure, the patient's blood pressure, pulse, and                            oxygen saturations were monitored continuously. The                            [id number]-[id number] ([id number]) scope was introduced through                            the anus and advanced to the the cecum, identified                            by appendiceal orifice and ileocecal valve. The                            colonoscopy was performed without difficulty. The                            patient tolerated the procedure well. The quality                            of the bowel preparation was inadequate. Scope In: 8:18:12 AM Scope Out: 8:34:32 AM Scope Withdrawal Time: 0 hours 7 minutes 19 seconds  Total Procedure Duration: 0 hours 16 minutes 20 seconds  Findings:      The perianal and digital rectal examinations were normal. Inadequate       preparation. Formed stool elements and vegetable matter throughout the       colon made exam more difficult. No gross carcinoma other lesion. A       smaller lesion may have been obscured by the poor prep.      Two sessile polyps were found in the hepatic flexure. The polyps were 6       to 8 mm in size. These polyps were removed with a cold snare. Resection       and retrieval were complete. Estimated blood loss was minimal. Estimated       blood loss was minimal.      The exam was otherwise without abnormality on direct and retroflexion       views. I did not identify any surgical anastomosis. Impression:               -  Preparation of the colon was inadequate.                           - Two 6 to 8 mm polyps at the hepatic flexure,                            removed with a cold snare. Resected and retrieved.                           - The examination was otherwise normal on direct                            and retroflexion views. Moderate Sedation:      Moderate (conscious) sedation was personally administered by an       anesthesia professional. The following parameters  were monitored: oxygen       saturation, heart rate, blood pressure, respiratory rate, EKG, adequacy       of pulmonary ventilation, and response to care. Recommendation:           - Patient has a contact number available for                            emergencies. The signs and symptoms of potential                            delayed complications were discussed with the                            patient. Return to normal activities tomorrow.                            Written discharge instructions were provided to the                            patient.                           - Advance diet as tolerated.                           - Repeat colonoscopy date to be determined after                            pending pathology results are reviewed for                            surveillance based on pathology results in the                            setting of an inadequate preparation today.                           - Return to GI office in 3 months. See EGD report. Procedure Code(s):        --- Professional ---  45385, Colonoscopy, flexible; with removal of                            tumor(s), polyp(s), or other lesion(s) by snare                            technique Diagnosis Code(s):        --- Professional ---                           Z12.11, Encounter for screening for malignant                            neoplasm of colon                           K63.5, Polyp of colon CPT copyright 2019 American Medical Association. All rights reserved. The codes documented in this report are preliminary and upon coder review may  be revised to meet current compliance requirements. Cristopher Estimable. Devita Nies, MD Norvel Richards, MD 04/15/2020 8:56:31 AM This report has been signed electronically. Number of Addenda: 0

## 2020-04-15 NOTE — Anesthesia Postprocedure Evaluation (Signed)
Anesthesia Post Note  Patient: [name]  Procedure(s) Performed: COLONOSCOPY WITH PROPOFOL (N/A ) ESOPHAGOGASTRODUODENOSCOPY (EGD) WITH PROPOFOL (N/A ) MALONEY DILATION (N/A ) BIOPSY POLYPECTOMY  Patient location during evaluation: PACU Anesthesia Type: General Level of consciousness: awake and alert and patient cooperative Pain management: satisfactory to patient Vital Signs Assessment: post-procedure vital signs reviewed and stable Respiratory status: spontaneous breathing and patient connected to nasal cannula oxygen Cardiovascular status: stable Postop Assessment: no apparent nausea or vomiting Anesthetic complications: no   No complications documented. Patient received breathing treatment. Denies complaints.  Last Vitals:  Vitals:   04/15/20 0915 04/15/20 0930  BP: 118/70 120/66  Pulse: 92 88  Resp: 20 19  Temp:    SpO2: 95% 92%    Last Pain:  Vitals:   04/15/20 0930  PainSc: 0-No pain                 Tracen Mahler

## 2020-04-15 NOTE — H&P (Signed)
@LOGO @   Primary Care Physician:  Celene Squibb, MD Primary Gastroenterologist:  Dr. Gala Romney  Pre-Procedure History & Physical: HPI:  Gregory Castrogiovanni Alvarado is a 69 y.o. male here for further evaluation of dysphagia via EGD.  Surveillance EGD to verify ulcer healing and eradication of H. pylori microcytic anemia here for colonoscopy as well.  History of complicated diverticulitis.  Past Medical History:  Diagnosis Date  . Arthritis    left shoulder  . COPD (chronic obstructive pulmonary disease) ([place])   . Diabetes mellitus without complication ([place])   . Dyslipidemia   . Hypertension   . Insomnia   . Insomnia   . Morbid obesity ([place])   . Tobacco use     Past Surgical History:  Procedure Laterality Date  . BIOPSY  10/28/2019   Procedure: BIOPSY;  Surgeon: Danie Binder, MD;  [facility];  Service: Endoscopy;;  gastric  . COLON RESECTION  1990s?   DIVERTICULITIS  . ESOPHAGOGASTRODUODENOSCOPY (EGD) WITH PROPOFOL N/A 10/28/2019   Dr. Oneida Alar: Multiple cratered gastric ulcers, gastritis/duodenitis.  Path showed H. pylori.  Patient treated with amoxicillin/Biaxin/PPI twice daily.  Marland Kitchen HERNIA REPAIR     UHR  . ROTATOR CUFF REPAIR Bilateral   . TYMPANOPLASTY      Prior to Admission medications   Medication Sig Start Date End Date Taking? Authorizing Provider  acetaminophen (TYLENOL) 325 MG tablet Take 2 tablets (650 mg total) by mouth every 6 (six) hours as needed for mild pain (or Fever >/= 101). 10/29/19  Yes Emokpae, Courage, MD  albuterol (VENTOLIN HFA) 108 (90 Base) MCG/ACT inhaler Inhale 2 puffs into the lungs every 6 (six) hours as needed for wheezing or shortness of breath. 10/29/19  Yes Emokpae, Courage, MD  atorvastatin (LIPITOR) 40 MG tablet Take 40 mg by mouth at bedtime. 08/30/19  Yes [provider]  cyanocobalamin (,VITAMIN B-12,) 1000 MCG/ML injection Inject 1,000 mcg into the muscle every 30 (thirty) days.    Yes [provider]  Fluticasone-Salmeterol  (ADVAIR DISKUS) 250-50 MCG/DOSE AEPB Inhale 1 puff into the lungs 2 (two) times daily. 10/29/19 10/28/20 Yes Emokpae, Courage, MD  folic acid (FOLVITE) 1 MG tablet Take 1 tablet (1 mg total) by mouth daily. Patient taking differently: Take 1 mg by mouth at bedtime.  10/30/19  Yes Emokpae, Courage, MD  hydrochlorothiazide (HYDRODIURIL) 12.5 MG tablet Take 12.5 mg by mouth at bedtime.  01/23/20  Yes [provider]  insulin NPH-regular Human (NOVOLIN 70/30) (70-30) 100 UNIT/ML injection Inject 50 Units into the skin in the morning and at bedtime.    Yes [provider]  lisinopril (PRINIVIL,ZESTRIL) 20 MG tablet Take 1 tablet (20 mg total) by mouth daily. 06/09/17  Yes Cassandria Anger, MD  pantoprazole (PROTONIX) 40 MG tablet Take 1 tablet (40 mg total) by mouth 2 (two) times daily before a meal. 10/29/19  Yes Emokpae, Courage, MD  traMADol (ULTRAM) 50 MG tablet Take 100 mg by mouth in the morning and at bedtime.  09/20/19  Yes [provider]  zolpidem (AMBIEN) 10 MG tablet Take 10 mg by mouth at bedtime.    Yes [provider]  Insulin Pen Needle (B-D ULTRAFINE III SHORT PEN) 31G X 8 MM MISC 1 each by Does not apply route as directed. 01/05/17   Cassandria Anger, MD  INSULIN SYRINGE .5CC/29G 29G X 1/2" 0.5 ML MISC Use to inject insulin 2 times a day 02/24/17   Cassandria Anger, MD  ONE [facility]  ULTRA TEST test strip TEST FOUR TIMES DAILY AS DIRECTED. 05/21/17   Cassandria Anger, MD    Allergies as of 03/13/2020 - Review Complete 01/23/2020  Allergen Reaction Noted  . Caduet [amlodipine-atorvastatin]  09/18/2015    Family History  Problem Relation Age of Onset  . Diabetes Mother   . Heart attack Mother   . Heart attack Father   . Heart attack Brother   . Colon cancer Neg Hx     Social History   Socioeconomic History  . Marital status: Divorced    Spouse name: Not on file  . Number of children: Not on file  . Years of education: Not on file   . Highest education level: Not on file  Occupational History  . Not on file  Tobacco Use  . Smoking status: Current Every Day Smoker    Packs/day: 2.00    Types: Cigarettes  . Smokeless tobacco: Never Used  Vaping Use  . Vaping Use: Never used  Substance and Sexual Activity  . Alcohol use: Not Currently    Comment: rare  . Drug use: No  . Sexual activity: Not on file  Other Topics Concern  . Not on file  Social History Narrative  . Not on file   Social Determinants of Health   Financial Resource Strain:   . Difficulty of Paying Living Expenses:   Food Insecurity:   . Worried About Charity fundraiser in the Last Year:   . Arboriculturist in the Last Year:   Transportation Needs:   . Film/video editor (Medical):   Marland Kitchen Lack of Transportation (Non-Medical):   Physical Activity:   . Days of Exercise per Week:   . Minutes of Exercise per Session:   Stress:   . Feeling of Stress :   Social Connections:   . Frequency of Communication with Friends and Family:   . Frequency of Social Gatherings with Friends and Family:   . Attends Religious Services:   . Active Member of Clubs or Organizations:   . Attends Archivist Meetings:   Marland Kitchen Marital Status:   Intimate Partner Violence:   . Fear of Current or Ex-Partner:   . Emotionally Abused:   Marland Kitchen Physically Abused:   . Sexually Abused:     Review of Systems: See HPI, otherwise negative ROS  Physical Exam: There were no vitals taken for this visit. General:   Alert,  Well-developed, well-nourished, pleasant and cooperative in NAD SNeck:  Supple; no masses or thyromegaly. No significant cervical adenopathy. Lungs:  Clear throughout to auscultation.   No wheezes, crackles, or rhonchi. No acute distress. Heart:  Regular rate and rhythm; no murmurs, clicks, rubs,  or gallops. Abdomen: Non-distended, normal bowel sounds.  Soft and nontender without appreciable mass or hepatosplenomegaly.  Pulses:  Normal pulses  noted. Extremities:  Without clubbing or edema.  Impression/Plan: 69 year old gentleman here for further evaluation of and treatment of dysphagia.  EGD and esophageal dilation.  History of microcytic anemia.  Here for colonoscopy as well. The risks, benefits, limitations, imponderables and alternatives regarding both EGD and colonoscopy have been reviewed with the patient. Questions have been answered. All parties agreeable.     Notice: This dictation was prepared with Dragon dictation along with smaller phrase technology. Any transcriptional errors that result from this process are unintentional and may not be corrected upon review.

## 2020-04-15 NOTE — Anesthesia Preprocedure Evaluation (Signed)
Anesthesia Evaluation  Patient identified by MRN, date of birth, ID band Patient awake    Reviewed: Allergy & Precautions, NPO status , Patient's Chart, lab work & pertinent test results  History of Anesthesia Complications Negative for: history of anesthetic complications  Airway Mallampati: II  TM Distance: >3 FB Neck ROM: Full    Dental  (+) Edentulous Upper, Edentulous Lower   Pulmonary COPD,  COPD inhaler and oxygen dependent, Current Smoker and Patient abstained from smoking.,    breath sounds clear to auscultation       Cardiovascular Exercise Tolerance: Poor hypertension, Pt. on medications + Valvular Problems/Murmurs  Rhythm:Regular Rate:Normal  26-Oct-2019 09:01:52 [facility] ROUTINE RECORD Sinus tachycardia Borderline right axis deviation Nonspecific repol abnormality, diffuse leads Since last tracing ST abnormality is new Otherwise no significant change Confirmed by Daleen Bo [phone]) on 10/26/2019 9:06:59 AM  Study Conclusions   - Left ventricle: Limited study with Definity contrast. Systolic  function was normal. The estimated ejection fraction was in the  range of 55% to 60%. Wall motion was normal; there were no  regional wall motion abnormalities.  - Mitral valve: There was trivial regurgitation.  - Pericardium, extracardiac: A prominent pericardial fat pad was  present.    Neuro/Psych negative neurological ROS  negative psych ROS   GI/Hepatic PUD, GERD (occasional)  ,  Endo/Other  diabetes, Poorly Controlled, Type 2, Oral Hypoglycemic Agents, Insulin Dependent  Renal/GU Renal disease     Musculoskeletal  (+) Arthritis , Osteoarthritis,    Abdominal   Peds  Hematology  (+) anemia ,   Anesthesia Other Findings   Reproductive/Obstetrics                            Anesthesia Physical  Anesthesia Plan  ASA: IV  Anesthesia Plan:  General   Post-op Pain Management:    Induction: Intravenous  PONV Risk Score and Plan: 1 and TIVA  Airway Management Planned: Nasal Cannula, Natural Airway and Simple Face Mask  Additional Equipment:   Intra-op Plan:   Post-operative Plan: Possible Post-op intubation/ventilation  Informed Consent: I have reviewed the patients History and Physical, chart, labs and discussed the procedure including the risks, benefits and alternatives for the proposed anesthesia with the patient or authorized representative who has indicated his/her understanding and acceptance.       Plan Discussed with: Surgeon and CRNA  Anesthesia Plan Comments:        Anesthesia Quick Evaluation

## 2020-04-15 NOTE — Transfer of Care (Signed)
Immediate Anesthesia Transfer of Care Note  Patient: Gregory Alvarado  Procedure(s) Performed: COLONOSCOPY WITH PROPOFOL (N/A ) ESOPHAGOGASTRODUODENOSCOPY (EGD) WITH PROPOFOL (N/A ) MALONEY DILATION (N/A ) BIOPSY POLYPECTOMY  Patient Location: PACU  Anesthesia Type:General  Level of Consciousness: awake and patient cooperative  Airway & Oxygen Therapy: Patient Spontanous Breathing and Patient connected to nasal cannula oxygen  Post-op Assessment: Report given to RN and Post -op Vital signs reviewed and stable  Post vital signs: Reviewed and stable  Last Vitals:  Vitals Value Taken Time  BP 121/73 04/15/20 0849  Temp 97.8   Pulse 100 04/15/20 0852  Resp 18 04/15/20 0852  SpO2 96 % 04/15/20 0852  Vitals shown include unvalidated device data.  Last Pain:  Vitals:   04/15/20 0742  PainSc: 0-No pain         Complications: No complications documented.

## 2020-04-16 ENCOUNTER — Other Ambulatory Visit: Payer: Self-pay

## 2020-04-16 LAB — SURGICAL PATHOLOGY

## 2020-04-18 ENCOUNTER — Encounter: Payer: Self-pay | Admitting: Internal Medicine

## 2020-04-18 ENCOUNTER — Ambulatory Visit: Payer: Medicare Other | Admitting: Orthopaedic Surgery

## 2020-04-18 ENCOUNTER — Encounter (HOSPITAL_COMMUNITY): Payer: Self-pay | Admitting: Internal Medicine

## 2020-04-25 ENCOUNTER — Other Ambulatory Visit: Payer: Self-pay

## 2020-04-25 ENCOUNTER — Ambulatory Visit
Admission: RE | Admit: 2020-04-25 | Discharge: 2020-04-25 | Disposition: A | Discharge status: Home / Self Care | Payer: Medicare Other | Source: Ambulatory Visit | Attending: Orthopaedic Surgery | Admitting: Orthopaedic Surgery

## 2020-04-25 ENCOUNTER — Other Ambulatory Visit: Payer: Self-pay | Admitting: Gastroenterology

## 2020-04-25 DIAGNOSIS — M25561 Pain in right knee: Secondary | ICD-10-CM | POA: Diagnosis not present

## 2020-04-25 DIAGNOSIS — G8929 Other chronic pain: Secondary | ICD-10-CM

## 2020-04-25 MED ORDER — PANTOPRAZOLE SODIUM 40 MG PO TBEC: 40.0000 mg | DELAYED_RELEASE_TABLET | Freq: Two times a day (BID) | ORAL | 5 refills | Status: DC

## 2020-04-25 MED ORDER — METRONIDAZOLE 500 MG PO TABS: 500.0000 mg | ORAL_TABLET | Freq: Four times a day (QID) | ORAL | 0 refills | Status: AC

## 2020-04-25 MED ORDER — TETRACYCLINE HCL 500 MG PO CAPS: 500.0000 mg | ORAL_CAPSULE | Freq: Four times a day (QID) | ORAL | 0 refills | Status: AC

## 2020-04-25 MED ORDER — BISMUTH SUBSALICYLATE 262 MG PO CHEW: 524.0000 mg | CHEWABLE_TABLET | Freq: Three times a day (TID) | ORAL | 0 refills | Status: AC

## 2020-04-26 ENCOUNTER — Telehealth: Payer: Self-pay | Admitting: Internal Medicine

## 2020-04-26 NOTE — Telephone Encounter (Signed)
OK. Please ask patient to hold on taking the other medications until we either get him tetracycline or an alternative.

## 2020-04-26 NOTE — Telephone Encounter (Signed)
[phone] please call patient about his prescriptions, one was over 100$ and he can not remember which one it was   Needs alternative

## 2020-04-26 NOTE — Telephone Encounter (Signed)
Lmom, waiting on a return call.  

## 2020-04-26 NOTE — Telephone Encounter (Signed)
FYI Spoke with pt.  Tetracycline is going to cost pt over $100.00. I called pts insurance and filled a cost reduction for tetracycline (case number [id number]) waiting to hear back from pts insurance. Pt was able to get all of the other medications to treat H. Pylori.

## 2020-04-26 NOTE — Telephone Encounter (Signed)
Noted, pt is aware to hold all mediation until he gets the Tetracycline.

## 2020-04-30 ENCOUNTER — Ambulatory Visit: Payer: Medicare Other

## 2020-04-30 ENCOUNTER — Encounter: Payer: Self-pay | Admitting: Orthopaedic Surgery

## 2020-04-30 ENCOUNTER — Other Ambulatory Visit: Payer: Self-pay

## 2020-04-30 ENCOUNTER — Ambulatory Visit: Payer: Medicare Other | Admitting: Orthopaedic Surgery

## 2020-04-30 VITALS — BP 146/69 | HR 92 | Ht 72.0 in | Wt 290.0 lb

## 2020-04-30 DIAGNOSIS — G8929 Other chronic pain: Secondary | ICD-10-CM

## 2020-04-30 DIAGNOSIS — M25552 Pain in left hip: Secondary | ICD-10-CM

## 2020-04-30 DIAGNOSIS — M25561 Pain in right knee: Secondary | ICD-10-CM | POA: Diagnosis not present

## 2020-04-30 MED ORDER — NAPROXEN 500 MG PO TABS
500.0000 mg | ORAL_TABLET | Freq: Two times a day (BID) | ORAL | 5 refills | Status: DC
Start: 2020-04-30 — End: 2020-07-16

## 2020-04-30 NOTE — Progress Notes (Signed)
[name], male DOB:[birth year], 69 y.o. UXN:[id number]  Chief Complaint  Patient presents with  . Knee Pain    GO over MRI Right knee, doing better    HPI  Gregory Alvarado is a 69 y.o. male who has right knee pain.  He had MRI which showed: IMPRESSION: 1. Complete tear of the medial meniscus posterior root with mild extrusion of the body. 2. Small longitudinal tear of the lateral meniscus posterior horn with undersurface fraying. 3. Near full-thickness cartilage loss over the posterior lateral tibial plateau. 6 mm intra-articular body in the popliteus tendon Sheath.  I have explained the findings to him.  I recommend arthroscopy.  He wants to wait.  He has a new problem:  persistent left hip pain.  He has no trauma.  It is getting worse over the last few months.  He has more pain after walking a while.   Body mass index is 39.33 kg/m.  ROS  Review of Systems  All other systems reviewed and are negative.  The following is a summary of the past history medically, past history surgically, known current medicines, social history and family history.  This information is gathered electronically by the computer from prior information and documentation.  I review this each visit and have found including this information at this point in the chart is beneficial and informative.    Past Medical History:  Diagnosis Date  . Arthritis    left shoulder  . COPD (chronic obstructive pulmonary disease) ([place])   . Diabetes mellitus without complication ([place])   . Dyslipidemia   . Hypertension   . Insomnia   . Insomnia   . Morbid obesity (Gregory Alvarado)   . Tobacco use     Past Surgical History:  Procedure Laterality Date  . BIOPSY  10/28/2019   Procedure: BIOPSY;  Surgeon: Gregory Binder, MD;  [facility];  Service: Endoscopy;;  gastric  . BIOPSY  04/15/2020   Procedure: BIOPSY;  Surgeon: Gregory Dolin, MD;  [facility];  Service: Endoscopy;;  gastric  .  COLON RESECTION  1990s?   DIVERTICULITIS  . COLONOSCOPY WITH PROPOFOL N/A 04/15/2020   Procedure: COLONOSCOPY WITH PROPOFOL;  Surgeon: Gregory Dolin, MD;  [facility];  Service: Endoscopy;  Laterality: N/A;  7:30am  . ESOPHAGOGASTRODUODENOSCOPY (EGD) WITH PROPOFOL N/A 10/28/2019   Dr. Oneida Alvarado: Multiple cratered gastric ulcers, gastritis/duodenitis.  Path showed H. pylori.  Patient treated with amoxicillin/Biaxin/PPI twice daily.  . ESOPHAGOGASTRODUODENOSCOPY (EGD) WITH PROPOFOL N/A 04/15/2020   Procedure: ESOPHAGOGASTRODUODENOSCOPY (EGD) WITH PROPOFOL;  Surgeon: Gregory Dolin, MD;  [facility];  Service: Endoscopy;  Laterality: N/A;  . HERNIA REPAIR     UHR  . MALONEY DILATION N/A 04/15/2020   Procedure: Gregory Alvarado DILATION;  Surgeon: Gregory Dolin, MD;  [facility];  Service: Endoscopy;  Laterality: N/A;  . POLYPECTOMY  04/15/2020   Procedure: POLYPECTOMY;  Surgeon: Gregory Dolin, MD;  [facility];  Service: Endoscopy;;  colon  . ROTATOR CUFF REPAIR Bilateral   . TYMPANOPLASTY      Family History  Problem Relation Age of Onset  . Diabetes Mother   . Heart attack Mother   . Heart attack Father   . Heart attack Brother   . Colon cancer Neg Hx     Social History Social History   Tobacco Use  . Smoking status: Current Every Day Smoker    Packs/day: 2.00  Types: Cigarettes  . Smokeless tobacco: Never Used  Vaping Use  . Vaping Use: Never used  Substance Use Topics  . Alcohol use: Not Currently    Comment: rare  . Drug use: No    Allergies  Allergen Reactions  . Caduet [Amlodipine-Atorvastatin] Swelling    Current Outpatient Medications  Medication Sig Dispense Refill  . acetaminophen (TYLENOL) 325 MG tablet Take 2 tablets (650 mg total) by mouth every 6 (six) hours as needed for mild pain (or Fever >/= 101). 12 tablet 0  . albuterol (VENTOLIN HFA) 108 (90 Base) MCG/ACT inhaler Inhale 2 puffs into the lungs every 6 (six) hours  as needed for wheezing or shortness of breath. 18 g 0  . atorvastatin (LIPITOR) 40 MG tablet Take 40 mg by mouth at bedtime.    . bismuth subsalicylate (PEPTO-BISMOL) 262 MG chewable tablet Chew 2 tablets (524 mg total) by mouth 4 (four) times daily -  before meals and at bedtime for 14 days. 112 tablet 0  . cyanocobalamin (,VITAMIN B-12,) 1000 MCG/ML injection Inject 1,000 mcg into the muscle every 30 (thirty) days.     . Fluticasone-Salmeterol (ADVAIR DISKUS) 250-50 MCG/DOSE AEPB Inhale 1 puff into the lungs 2 (two) times daily. 60 each 2  . folic acid (FOLVITE) 1 MG tablet Take 1 tablet (1 mg total) by mouth daily. (Patient taking differently: Take 1 mg by mouth at bedtime. ) 30 tablet 2  . hydrochlorothiazide (HYDRODIURIL) 12.5 MG tablet Take 12.5 mg by mouth at bedtime.     . insulin NPH-regular Human (NOVOLIN 70/30) (70-30) 100 UNIT/ML injection Inject 50 Units into the skin in the morning and at bedtime.     . Insulin Pen Needle (B-D ULTRAFINE III SHORT PEN) 31G X 8 MM MISC 1 each by Does not apply route as directed. 100 each 3  . INSULIN SYRINGE .5CC/29G 29G X 1/2" 0.5 ML MISC Use to inject insulin 2 times a day 100 each 3  . lisinopril (PRINIVIL,ZESTRIL) 20 MG tablet Take 1 tablet (20 mg total) by mouth daily. 30 tablet 6  . metroNIDAZOLE (FLAGYL) 500 MG tablet Take 1 tablet (500 mg total) by mouth 4 (four) times daily for 14 days. With food 56 tablet 0  . ONE TOUCH ULTRA TEST test strip TEST FOUR TIMES DAILY AS DIRECTED. 150 each 5  . pantoprazole (PROTONIX) 40 MG tablet Take 1 tablet (40 mg total) by mouth 2 (two) times daily before a meal. 60 tablet 5  . tetracycline (SUMYCIN) 500 MG capsule Take 1 capsule (500 mg total) by mouth 4 (four) times daily for 14 days. With food 56 capsule 0  . traMADol (ULTRAM) 50 MG tablet Take 100 mg by mouth in the morning and at bedtime.     Marland Kitchen zolpidem (AMBIEN) 10 MG tablet Take 10 mg by mouth at bedtime.     . naproxen (NAPROSYN) 500 MG tablet Take 1  tablet (500 mg total) by mouth 2 (two) times daily with a meal. 60 tablet 5   No current facility-administered medications for this visit.     Physical Exam  Blood pressure (!) 146/69, pulse 92, height 6' (1.829 m), weight 290 lb (131.5 kg).  Constitutional: overall normal hygiene, normal nutrition, well developed, normal grooming, normal body habitus. Assistive device:none  Musculoskeletal: gait and station Limp right, muscle tone and strength are normal, no tremors or atrophy is present.  .  Neurological: coordination overall normal.  Deep tendon reflex/nerve stretch intact.  Sensation normal.  Cranial nerves II-XII intact.   Skin:   Normal overall no scars, lesions, ulcers or rashes. No psoriasis.  Psychiatric: Alert and oriented x 3.  Recent memory intact, remote memory unclear.  Normal mood and affect. Well groomed.  Good eye contact.  Cardiovascular: overall no swelling, no varicosities, no edema bilaterally, normal temperatures of the legs and arms, no clubbing, cyanosis and good capillary refill.  Lymphatic: palpation is normal.  Right knee has tenderness, ROM 0 to 110, positive medial Mc[place]ray, limp right.  Left hip has decreased external rotation of 10 degrees, internal of 15, flexes to 90, extension 5.    All other systems reviewed and are negative   The patient has been educated about the nature of the problem(s) and counseled on treatment options.  The patient appeared to understand what I have discussed and is in agreement with it.  Encounter Diagnoses  Name Primary?  . Chronic left hip pain Yes  . Chronic pain of right knee    X-rays were done of the left hip, reported separately.  I will begin naprosyn for the left hip.  He will call if he wants any surgery on the right knee.  PLAN Call if any problems.  Precautions discussed.  Continue current medications.   Return to clinic 1 month   Electronically Signed Sanjuana Kava, MD 8/17/202111:19 AM

## 2020-04-30 NOTE — Progress Notes (Signed)
Dg h

## 2020-05-01 NOTE — Telephone Encounter (Signed)
If cost is going to be an issue, we can substitute the tetracycline with doxycycline 100mg  BID for 14 days. You can send in RX but make sure pharmacy knows we are substituting and patient aware to pick up.  The doxycycline, pepto, metronidazole, and pantoprazole will need to be taken as prescribed for 14 days and then continue pantoprazole BID thereafter.

## 2020-05-02 ENCOUNTER — Other Ambulatory Visit: Payer: Self-pay

## 2020-05-02 MED ORDER — DOXYCYCLINE MONOHYDRATE 100 MG PO TABS: 100.0000 mg | ORAL_TABLET | Freq: Two times a day (BID) | ORAL | 0 refills | Status: AC

## 2020-05-02 NOTE — Telephone Encounter (Signed)
Lmom, waiting on a return call.  

## 2020-05-02 NOTE — Telephone Encounter (Signed)
Noted. Spoke with pt. Pt states the medication is still too expensive. Doxycyline 100 mg one capsule bid x 14 days was sent to pts pharmacy. Pt is aware that he will take Doxycycline, Pepto, Metronidazole and Pantoprazole.

## 2020-05-06 DIAGNOSIS — J449 Chronic obstructive pulmonary disease, unspecified: Secondary | ICD-10-CM | POA: Diagnosis not present

## 2020-05-13 DIAGNOSIS — Z720 Tobacco use: Secondary | ICD-10-CM | POA: Diagnosis not present

## 2020-05-13 DIAGNOSIS — Z0001 Encounter for general adult medical examination with abnormal findings: Secondary | ICD-10-CM | POA: Diagnosis not present

## 2020-05-13 DIAGNOSIS — D72829 Elevated white blood cell count, unspecified: Secondary | ICD-10-CM | POA: Diagnosis not present

## 2020-05-13 DIAGNOSIS — M545 Low back pain: Secondary | ICD-10-CM | POA: Diagnosis not present

## 2020-05-13 DIAGNOSIS — E1165 Type 2 diabetes mellitus with hyperglycemia: Secondary | ICD-10-CM | POA: Diagnosis not present

## 2020-05-13 DIAGNOSIS — Z712 Person consulting for explanation of examination or test findings: Secondary | ICD-10-CM | POA: Diagnosis not present

## 2020-05-14 DIAGNOSIS — I10 Essential (primary) hypertension: Secondary | ICD-10-CM | POA: Diagnosis not present

## 2020-05-14 DIAGNOSIS — G4700 Insomnia, unspecified: Secondary | ICD-10-CM | POA: Diagnosis not present

## 2020-05-14 DIAGNOSIS — R600 Localized edema: Secondary | ICD-10-CM | POA: Diagnosis not present

## 2020-05-14 DIAGNOSIS — D509 Iron deficiency anemia, unspecified: Secondary | ICD-10-CM | POA: Diagnosis not present

## 2020-05-14 DIAGNOSIS — J441 Chronic obstructive pulmonary disease with (acute) exacerbation: Secondary | ICD-10-CM | POA: Diagnosis not present

## 2020-05-22 ENCOUNTER — Other Ambulatory Visit: Payer: Self-pay

## 2020-05-22 ENCOUNTER — Encounter (HOSPITAL_COMMUNITY)
Admission: RE | Admit: 2020-05-22 | Discharge: 2020-05-22 | Disposition: A | Discharge status: Home / Self Care | Payer: Medicare Other | Source: Ambulatory Visit | Attending: Internal Medicine | Admitting: Internal Medicine

## 2020-05-22 DIAGNOSIS — D509 Iron deficiency anemia, unspecified: Secondary | ICD-10-CM | POA: Diagnosis not present

## 2020-05-22 MED ORDER — SODIUM CHLORIDE 0.9 % IV SOLN: Freq: Once | INTRAVENOUS | Status: AC

## 2020-05-22 MED ORDER — SODIUM CHLORIDE 0.9 % IV SOLN
510.0000 mg | Freq: Once | INTRAVENOUS | Status: AC
  Administered 2020-05-22: 510 mg via INTRAVENOUS
  Filled 2020-05-22: qty 510

## 2020-05-28 ENCOUNTER — Ambulatory Visit (INDEPENDENT_AMBULATORY_CARE_PROVIDER_SITE_OTHER): Payer: Medicare Other | Admitting: Orthopaedic Surgery

## 2020-05-28 ENCOUNTER — Encounter: Payer: Self-pay | Admitting: Orthopaedic Surgery

## 2020-05-28 ENCOUNTER — Other Ambulatory Visit: Payer: Self-pay

## 2020-05-28 VITALS — BP 154/75 | HR 106 | Ht 72.0 in | Wt 300.0 lb

## 2020-05-28 DIAGNOSIS — Z6841 Body Mass Index (BMI) 40.0 and over, adult: Secondary | ICD-10-CM | POA: Diagnosis not present

## 2020-05-28 DIAGNOSIS — M25561 Pain in right knee: Secondary | ICD-10-CM | POA: Diagnosis not present

## 2020-05-28 DIAGNOSIS — G8929 Other chronic pain: Secondary | ICD-10-CM | POA: Diagnosis not present

## 2020-05-28 NOTE — Progress Notes (Signed)
My knee is better  His right knee has less pain today.  He still has effusion and crepitus but the pain is less.  ROM 0 to 105.  Encounter Diagnoses  Name Primary?  . Chronic pain of right knee Yes  . Body mass index 40.0-44.9, adult ([place])   . Morbid obesity ([place])    I will see him as needed.  He will need arthroscopy when he decides he wants it.  Call if any problem.  Precautions discussed.   Electronically Signed Sanjuana Kava, MD 9/14/20219:31 AM

## 2020-05-29 ENCOUNTER — Encounter (HOSPITAL_COMMUNITY): Payer: Self-pay

## 2020-05-29 ENCOUNTER — Encounter (HOSPITAL_COMMUNITY)
Admission: RE | Admit: 2020-05-29 | Discharge: 2020-05-29 | Disposition: A | Discharge status: Home / Self Care | Payer: Medicare Other | Source: Ambulatory Visit | Attending: Internal Medicine | Admitting: Internal Medicine

## 2020-05-29 DIAGNOSIS — D509 Iron deficiency anemia, unspecified: Secondary | ICD-10-CM | POA: Diagnosis not present

## 2020-05-29 DIAGNOSIS — B029 Zoster without complications: Secondary | ICD-10-CM | POA: Diagnosis not present

## 2020-05-29 DIAGNOSIS — J441 Chronic obstructive pulmonary disease with (acute) exacerbation: Secondary | ICD-10-CM | POA: Diagnosis not present

## 2020-05-29 HISTORY — DX: Anemia, unspecified: D64.9

## 2020-05-29 MED ORDER — SODIUM CHLORIDE 0.9 % IV SOLN: Freq: Once | INTRAVENOUS | Status: AC

## 2020-05-29 MED ORDER — SODIUM CHLORIDE 0.9 % IV SOLN
510.0000 mg | Freq: Once | INTRAVENOUS | Status: AC
  Administered 2020-05-29: 510 mg via INTRAVENOUS
  Filled 2020-05-29: qty 510

## 2020-06-06 DIAGNOSIS — J449 Chronic obstructive pulmonary disease, unspecified: Secondary | ICD-10-CM | POA: Diagnosis not present

## 2020-06-12 DIAGNOSIS — E1129 Type 2 diabetes mellitus with other diabetic kidney complication: Secondary | ICD-10-CM | POA: Diagnosis not present

## 2020-06-12 DIAGNOSIS — Z720 Tobacco use: Secondary | ICD-10-CM | POA: Diagnosis not present

## 2020-06-12 DIAGNOSIS — Z0001 Encounter for general adult medical examination with abnormal findings: Secondary | ICD-10-CM | POA: Diagnosis not present

## 2020-06-12 DIAGNOSIS — D72829 Elevated white blood cell count, unspecified: Secondary | ICD-10-CM | POA: Diagnosis not present

## 2020-06-12 DIAGNOSIS — M545 Low back pain: Secondary | ICD-10-CM | POA: Diagnosis not present

## 2020-06-12 DIAGNOSIS — E782 Mixed hyperlipidemia: Secondary | ICD-10-CM | POA: Diagnosis not present

## 2020-06-12 DIAGNOSIS — R252 Cramp and spasm: Secondary | ICD-10-CM | POA: Diagnosis not present

## 2020-06-12 DIAGNOSIS — Z712 Person consulting for explanation of examination or test findings: Secondary | ICD-10-CM | POA: Diagnosis not present

## 2020-06-19 DIAGNOSIS — I10 Essential (primary) hypertension: Secondary | ICD-10-CM | POA: Diagnosis not present

## 2020-06-19 DIAGNOSIS — R600 Localized edema: Secondary | ICD-10-CM | POA: Diagnosis not present

## 2020-06-19 DIAGNOSIS — J441 Chronic obstructive pulmonary disease with (acute) exacerbation: Secondary | ICD-10-CM | POA: Diagnosis not present

## 2020-06-19 DIAGNOSIS — G4700 Insomnia, unspecified: Secondary | ICD-10-CM | POA: Diagnosis not present

## 2020-06-19 DIAGNOSIS — D509 Iron deficiency anemia, unspecified: Secondary | ICD-10-CM | POA: Diagnosis not present

## 2020-07-06 DIAGNOSIS — J449 Chronic obstructive pulmonary disease, unspecified: Secondary | ICD-10-CM | POA: Diagnosis not present

## 2020-07-09 ENCOUNTER — Other Ambulatory Visit (HOSPITAL_COMMUNITY)
Admission: RE | Admit: 2020-07-09 | Discharge: 2020-07-09 | Disposition: A | Discharge status: Home / Self Care | Payer: Medicare Other | Source: Ambulatory Visit | Attending: Cardiology | Admitting: Cardiology

## 2020-07-09 ENCOUNTER — Encounter: Payer: Self-pay | Admitting: Cardiology

## 2020-07-09 ENCOUNTER — Ambulatory Visit: Payer: Medicare Other | Admitting: Cardiology

## 2020-07-09 ENCOUNTER — Other Ambulatory Visit: Payer: Self-pay

## 2020-07-09 VITALS — BP 138/80 | HR 90 | Ht 72.0 in | Wt 290.2 lb

## 2020-07-09 DIAGNOSIS — I10 Essential (primary) hypertension: Secondary | ICD-10-CM | POA: Diagnosis not present

## 2020-07-09 DIAGNOSIS — I5032 Chronic diastolic (congestive) heart failure: Secondary | ICD-10-CM | POA: Diagnosis not present

## 2020-07-09 DIAGNOSIS — I2089 Other forms of angina pectoris: Secondary | ICD-10-CM | POA: Insufficient documentation

## 2020-07-09 DIAGNOSIS — J449 Chronic obstructive pulmonary disease, unspecified: Secondary | ICD-10-CM

## 2020-07-09 DIAGNOSIS — Z794 Long term (current) use of insulin: Secondary | ICD-10-CM

## 2020-07-09 DIAGNOSIS — I5042 Chronic combined systolic (congestive) and diastolic (congestive) heart failure: Secondary | ICD-10-CM | POA: Insufficient documentation

## 2020-07-09 DIAGNOSIS — K279 Peptic ulcer, site unspecified, unspecified as acute or chronic, without hemorrhage or perforation: Secondary | ICD-10-CM

## 2020-07-09 DIAGNOSIS — I208 Other forms of angina pectoris: Secondary | ICD-10-CM

## 2020-07-09 DIAGNOSIS — E119 Type 2 diabetes mellitus without complications: Secondary | ICD-10-CM

## 2020-07-09 DIAGNOSIS — N182 Chronic kidney disease, stage 2 (mild): Secondary | ICD-10-CM

## 2020-07-09 LAB — BASIC METABOLIC PANEL
Anion gap: 6 (ref 5–15)
BUN: 16 mg/dL (ref 8–23)
CO2: 27 mmol/L (ref 22–32)
Calcium: 9 mg/dL (ref 8.9–10.3)
Chloride: 101 mmol/L (ref 98–111)
Creatinine, Ser: 1.07 mg/dL (ref 0.61–1.24)
GFR, Estimated: >60 mL/min (ref >60)
Glucose, Bld: 101 mg/dL — ABNORMAL HIGH (ref 70–99)
Potassium: 4.2 mmol/L (ref 3.5–5.1)
Sodium: 134 mmol/L — ABNORMAL LOW (ref 135–145)

## 2020-07-09 LAB — CBC
HCT: 31.3 % — ABNORMAL LOW (ref 39.0–52.0)
Hemoglobin: 8.4 g/dL — ABNORMAL LOW (ref 13.0–17.0)
MCH: 22.7 pg — ABNORMAL LOW (ref 26.0–34.0)
MCHC: 26.8 g/dL — ABNORMAL LOW (ref 30.0–36.0)
MCV: 84.6 fL (ref 80.0–100.0)
Platelets: 343 10*3/uL (ref 150–400)
RBC: 3.7 MIL/uL — ABNORMAL LOW (ref 4.22–5.81)
RDW: 22.1 % — ABNORMAL HIGH (ref 11.5–15.5)
WBC: 10.7 10*3/uL — ABNORMAL HIGH (ref 4.0–10.5)
nRBC: 0 % (ref 0.0–0.2)

## 2020-07-09 MED ORDER — ISOSORBIDE MONONITRATE ER 30 MG PO TB24: 30.0000 mg | ORAL_TABLET | Freq: Every day | ORAL | 3 refills | Status: DC

## 2020-07-09 MED ORDER — NITROGLYCERIN 0.4 MG SL SUBL: 0.4000 mg | SUBLINGUAL_TABLET | SUBLINGUAL | 3 refills | Status: DC | PRN

## 2020-07-09 NOTE — Assessment & Plan Note (Signed)
2 PPD smoker- now on chronic O2

## 2020-07-09 NOTE — Patient Instructions (Addendum)
Medication Instructions:  Your physician has recommended you make the following change in your medication:   Start Imdur 30 mg Daily May take 2 if needed  Start Nitro 0.4 mg as needed for chest pain.   *If you need a refill on your cardiac medications before your next appointment, please call your pharmacy*   Lab Work: Your physician recommends that you return for lab work in: Today   If you have labs (blood work) drawn today and your tests are completely normal, you will receive your results only by: Marland Kitchen MyChart Message (if you have MyChart) OR . A paper copy in the mail If you have any lab test that is abnormal or we need to change your treatment, we will call you to review the results.   Testing/Procedures: NONE    Follow-Up: At [facility], you and your health needs are our priority.  As part of our continuing mission to provide you with exceptional heart care, we have created designated Provider Care Teams.  These Care Teams include your primary Cardiologist (physician) and Advanced Practice Providers (APPs -  Physician Assistants and Nurse Practitioners) who all work together to provide you with the care you need, when you need it.  We recommend signing up for the patient portal called "MyChart".  Sign up information is provided on this After Visit Summary.  MyChart is used to connect with patients for Virtual Visits (Telemedicine).  Patients are able to view lab/test results, encounter notes, upcoming appointments, etc.  Non-urgent messages can be sent to your provider as well.   To learn more about what you can do with MyChart, go to [email or web address].    Your next appointment:    As planned   The format for your next appointment:   In Person  Provider:   Carlyle Dolly, MD   Other Instructions Thank you for choosing [facility]!

## 2020-07-09 NOTE — Assessment & Plan Note (Signed)
New for him- c/o exertional chest ache with pain down both arms for the past 3 weeks. Relieved with rest.

## 2020-07-09 NOTE — Progress Notes (Signed)
Cardiology Office Note:    Date:  07/09/2020   ID:  Gregory Alvarado, DOB 1951/08/25, MRN [id number]  PCP:  Celene Squibb, MD  Cardiologist:  Carlyle Dolly, MD  Electrophysiologist:  None   Referring MD: Celene Squibb, MD   CC: exertional chest pain with radiation down both arms  History of Present Illness:    Gregory Alvarado is a 69 y.o. male with a hx of morbid obesity, COPD-2 pack-a-day smoker now on chronic O2, insulin-dependent diabetes, chronic renal insufficiency stage II, history of peptic ulcer disease and GI bleeding, hypertension, dyslipidemia, and chronic diastolic heart failure.  He has no history of CAD or MI and has not had functional study in the past.  Echocardiogram in 2019 was limited and showed preserved LV function.  Patient has had chronic diastolic heart failure with lower extremity edema.  He has had problems with diuretic intolerance.  He presents to the office today for 30-month follow-up.  Since we saw him last he has been placed on chronic O2.  He saw Dr. Work in August for a follow-up EGD from his GI bleed in February 2021.  His follow-up EGD showed healed ulcers.  The patient says he continues to have dark stools.  For the past 3 weeks he has been complaining of exertional chest discomfort which radiates down his arms.  His activity level is limited to wheelchair and short walking distances.  He has not had rest chest pain.  Past Medical History:  Diagnosis Date  . Anemia   . Arthritis    left shoulder  . COPD (chronic obstructive pulmonary disease) ([place])   . Diabetes mellitus without complication ([place])   . Dyslipidemia   . Hypertension   . Insomnia   . Insomnia   . Morbid obesity ([place])   . Tobacco use     Past Surgical History:  Procedure Laterality Date  . BIOPSY  10/28/2019   Procedure: BIOPSY;  Surgeon: Danie Binder, MD;  [facility];  Service: Endoscopy;;  gastric  . BIOPSY  04/15/2020   Procedure: BIOPSY;  Surgeon: Daneil Dolin,  MD;  [facility];  Service: Endoscopy;;  gastric  . COLON RESECTION  1990s?   DIVERTICULITIS  . COLONOSCOPY WITH PROPOFOL N/A 04/15/2020   Procedure: COLONOSCOPY WITH PROPOFOL;  Surgeon: Daneil Dolin, MD;  [facility];  Service: Endoscopy;  Laterality: N/A;  7:30am  . ESOPHAGOGASTRODUODENOSCOPY (EGD) WITH PROPOFOL N/A 10/28/2019   Dr. Oneida Alar: Multiple cratered gastric ulcers, gastritis/duodenitis.  Path showed H. pylori.  Patient treated with amoxicillin/Biaxin/PPI twice daily.  . ESOPHAGOGASTRODUODENOSCOPY (EGD) WITH PROPOFOL N/A 04/15/2020   Procedure: ESOPHAGOGASTRODUODENOSCOPY (EGD) WITH PROPOFOL;  Surgeon: Daneil Dolin, MD;  [facility];  Service: Endoscopy;  Laterality: N/A;  . HERNIA REPAIR     UHR  . MALONEY DILATION N/A 04/15/2020   Procedure: Venia Minks DILATION;  Surgeon: Daneil Dolin, MD;  [facility];  Service: Endoscopy;  Laterality: N/A;  . POLYPECTOMY  04/15/2020   Procedure: POLYPECTOMY;  Surgeon: Daneil Dolin, MD;  [facility];  Service: Endoscopy;;  colon  . ROTATOR CUFF REPAIR Bilateral   . TYMPANOPLASTY      Current Medications: Current Meds  Medication Sig  . acetaminophen (TYLENOL) 325 MG tablet Take 2 tablets (650 mg total) by mouth every 6 (six) hours as needed for mild pain (or Fever >/= 101).  Marland Kitchen albuterol (VENTOLIN HFA) 108 (90 Base) MCG/ACT  inhaler Inhale 2 puffs into the lungs every 6 (six) hours as needed for wheezing or shortness of breath.  Marland Kitchen atorvastatin (LIPITOR) 40 MG tablet Take 40 mg by mouth at bedtime.  . cyanocobalamin (,VITAMIN B-12,) 1000 MCG/ML injection Inject 1,000 mcg into the muscle every 30 (thirty) days.   . folic acid (FOLVITE) 1 MG tablet Take 1 tablet (1 mg total) by mouth daily. (Patient taking differently: Take 1 mg by mouth at bedtime. )  . hydrochlorothiazide (HYDRODIURIL) 12.5 MG tablet Take 12.5 mg by mouth at bedtime.   . insulin NPH-regular Human (NOVOLIN 70/30) (70-30)  100 UNIT/ML injection Inject 50 Units into the skin in the morning and at bedtime.   . Insulin Pen Needle (B-D ULTRAFINE III SHORT PEN) 31G X 8 MM MISC 1 each by Does not apply route as directed.  . INSULIN SYRINGE .5CC/29G 29G X 1/2" 0.5 ML MISC Use to inject insulin 2 times a day  . lisinopril (PRINIVIL,ZESTRIL) 20 MG tablet Take 1 tablet (20 mg total) by mouth daily.  . naproxen (NAPROSYN) 500 MG tablet Take 1 tablet (500 mg total) by mouth 2 (two) times daily with a meal.  . ONE TOUCH ULTRA TEST test strip TEST FOUR TIMES DAILY AS DIRECTED.  Marland Kitchen pantoprazole (PROTONIX) 40 MG tablet Take 1 tablet (40 mg total) by mouth 2 (two) times daily before a meal.  . traMADol (ULTRAM) 50 MG tablet Take 100 mg by mouth in the morning and at bedtime.   Marland Kitchen zolpidem (AMBIEN) 10 MG tablet Take 10 mg by mouth at bedtime.      Allergies:   Caduet [amlodipine-atorvastatin]   Social History   Socioeconomic History  . Marital status: Divorced    Spouse name: Not on file  . Number of children: Not on file  . Years of education: Not on file  . Highest education level: Not on file  Occupational History  . Not on file  Tobacco Use  . Smoking status: Current Every Day Smoker    Packs/day: 2.00    Types: Cigarettes  . Smokeless tobacco: Never Used  Vaping Use  . Vaping Use: Never used  Substance and Sexual Activity  . Alcohol use: Not Currently    Comment: rare  . Drug use: No  . Sexual activity: Not on file  Other Topics Concern  . Not on file  Social History Narrative  . Not on file   Social Determinants of Health   Financial Resource Strain:   . Difficulty of Paying Living Expenses: Not on file  Food Insecurity:   . Worried About Charity fundraiser in the Last Year: Not on file  . Ran Out of Food in the Last Year: Not on file  Transportation Needs:   . Lack of Transportation (Medical): Not on file  . Lack of Transportation (Non-Medical): Not on file  Physical Activity:   . Days of  Exercise per Week: Not on file  . Minutes of Exercise per Session: Not on file  Stress:   . Feeling of Stress : Not on file  Social Connections:   . Frequency of Communication with Friends and Family: Not on file  . Frequency of Social Gatherings with Friends and Family: Not on file  . Attends Religious Services: Not on file  . Active Member of Clubs or Organizations: Not on file  . Attends Archivist Meetings: Not on file  . Marital Status: Not on file     Family History: The  patient's family history includes Diabetes in his mother; Heart attack in his brother, father, and mother. There is no history of Colon cancer.  ROS:   Please see the history of present illness.     All other systems reviewed and are negative.  EKGs/Labs/Other Studies Reviewed:    The following studies were reviewed today: Echo dec 2019- Limited- EF 55-60%  EKG:  EKG is ordered today.  The ekg ordered today demonstrates NSR, HR 94, NSST changes  Recent Labs: 10/26/2019: ALT 13 10/29/2019: Hemoglobin 8.4; Platelets 342 04/12/2020: BUN 20; Creatinine, Ser 1.22; Potassium 4.2; Sodium 140  Recent Lipid Panel    Component Value Date/Time   CHOL 186 02/18/2017 0737   TRIG 329 (H) 02/18/2017 0737   HDL 28 (L) 02/18/2017 0737   LDLCALC 92 02/18/2017 0737    Physical Exam:    VS:  BP 138/80   Pulse 90   Ht 6' (1.829 m)   Wt 290 lb 3.2 oz (131.6 kg)   SpO2 98% Comment: 2 L O2  BMI 39.36 kg/m     Wt Readings from Last 3 Encounters:  07/09/20 290 lb 3.2 oz (131.6 kg)  05/28/20 300 lb (136.1 kg)  05/22/20 300 lb (136.1 kg)     GEN: Obese, chronically ill appearing Caucasian male who presents in a wheelchair on O2 in no acute distress HEENT: Normal NECK: No JVD; No carotid bruits CARDIAC: RRR, no murmurs, rubs, gallops RESPIRATORY:  Scatter rhonchi and faint expiratory wheezing  ABDOMEN: truncal obesity MUSCULOSKELETAL: 1+ LE edema Lt > Rt; No deformity  SKIN: Warm and dry NEUROLOGIC:   Alert and oriented x 3 PSYCHIATRIC:  Normal affect   ASSESSMENT:    Exertional angina ([place]) New for him- c/o exertional chest ache with pain down both arms for the past 3 weeks. Relieved with rest.  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  He is not a candidate for Nuclear stress secondary to wheezing.  He is not a candidate for CT secondary to weight.  He would be a poor candidate for cath secondary to CRI, inability to lay flat, and PUD may prohibit DAPT Rx.    I reviewed his case with Dr Harl Bowie- for now add Imdur 30 mg.  If his symptoms improve but don't resolve he can increase this to 60mg .  Check CBC and BMP today.  If his symptoms progress we will need to consider a cath.  F/U in one month with Dr Harl Bowie. SL NTG provided.   Medication Adjustments/Labs and Tests Ordered: Current medicines are reviewed at length with the patient today.  Concerns regarding medicines are outlined above.  Orders Placed This Encounter  Procedures  . Basic Metabolic Panel (BMET)  . CBC   Meds ordered this encounter  Medications  . isosorbide mononitrate (IMDUR) 30 MG 24 hr tablet    Sig: Take 1 tablet (30 mg total)  by mouth daily. May take 2 if needed    Dispense:  180 tablet    Refill:  3  . nitroGLYCERIN (NITROSTAT) 0.4 MG SL tablet    Sig: Place 1  tablet (0.4 mg total) under the tongue every 5 (five) minutes as needed for chest pain.    Dispense:  25 tablet    Refill:  3    Patient Instructions  Medication Instructions:  Your physician has recommended you make the following change in your medication:   Start Imdur 30 mg Daily May take 2 if needed  Start Nitro 0.4 mg as needed for chest pain.   *If you need a refill on your cardiac medications before your next appointment, please call your pharmacy*   Lab Work: Your physician recommends that you return for lab work in: Today   If you have labs (blood work) drawn today and your tests are completely normal, you will receive your results only by: Marland Kitchen MyChart Message (if you have MyChart) OR . A paper copy in the mail If you have any lab test that is abnormal or we need to change your treatment, we will call you to review the results.   Testing/Procedures: NONE    Follow-Up: At [facility], you and your health needs are our priority.  As part of our continuing mission to provide you with exceptional heart care, we have created designated Provider Care Teams.  These Care Teams include your primary Cardiologist (physician) and Advanced Practice Providers (APPs -  Physician Assistants and Nurse Practitioners) who all work together to provide you with the care you need, when you need it.  We recommend signing up for the patient portal called "MyChart".  Sign up information is provided on this After Visit Summary.  MyChart is used to connect with patients for Virtual Visits (Telemedicine).  Patients are able to view lab/test results, encounter notes, upcoming appointments, etc.  Non-urgent messages can be sent to your provider as well.   To learn more about what you can do with MyChart, go to [email or web address].    Your next appointment:    As planned   The format for your next appointment:   In Person  Provider:   Carlyle Dolly, MD   Other Instructions Thank you for  choosing [facility]!       Angelena Form, PA-C  07/09/2020 8:53 AM    [facility]

## 2020-07-09 NOTE — Assessment & Plan Note (Signed)
GFR 40-50

## 2020-07-09 NOTE — Assessment & Plan Note (Signed)
Secondary to NSAIDs- GI bleed Feb 2021 requiring transfusion Currently c/o of melana- recent EGD showed healed ulcers (Aug 2021)

## 2020-07-09 NOTE — Assessment & Plan Note (Signed)
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

## 2020-07-09 NOTE — Assessment & Plan Note (Signed)
With CRI-2

## 2020-07-09 NOTE — Assessment & Plan Note (Signed)
Limited echo Dec 2019 showed EF 55-60% Pt has had problems with diuretic intolerance

## 2020-07-12 DIAGNOSIS — R944 Abnormal results of kidney function studies: Secondary | ICD-10-CM | POA: Diagnosis not present

## 2020-07-12 DIAGNOSIS — D72829 Elevated white blood cell count, unspecified: Secondary | ICD-10-CM | POA: Diagnosis not present

## 2020-07-12 DIAGNOSIS — K922 Gastrointestinal hemorrhage, unspecified: Secondary | ICD-10-CM | POA: Diagnosis not present

## 2020-07-12 DIAGNOSIS — Z720 Tobacco use: Secondary | ICD-10-CM | POA: Diagnosis not present

## 2020-07-16 ENCOUNTER — Encounter: Payer: Self-pay | Admitting: Gastroenterology

## 2020-07-16 ENCOUNTER — Ambulatory Visit: Payer: Medicare Other | Admitting: Gastroenterology

## 2020-07-16 ENCOUNTER — Other Ambulatory Visit: Payer: Self-pay

## 2020-07-16 VITALS — BP 127/62 | HR 109 | Temp 96.8°F | Ht 72.0 in | Wt 294.2 lb

## 2020-07-16 DIAGNOSIS — D649 Anemia, unspecified: Secondary | ICD-10-CM | POA: Diagnosis not present

## 2020-07-16 DIAGNOSIS — Z86010 Personal history of colonic polyps: Secondary | ICD-10-CM | POA: Insufficient documentation

## 2020-07-16 DIAGNOSIS — K5900 Constipation, unspecified: Secondary | ICD-10-CM | POA: Diagnosis not present

## 2020-07-16 DIAGNOSIS — A048 Other specified bacterial intestinal infections: Secondary | ICD-10-CM

## 2020-07-16 NOTE — Patient Instructions (Signed)
1. Please stop pantoprazole for 2 weeks. Then go to the lab for your H.pylori breath test at [place] cannot take pantoprazole or other antacids or antibiotics within two weeks of your breath test.  2. I will follow up on labs done through Dr. Juel Burrow office.  3. Please see cardiology for your chest pain as discussed. If your chest pain is persisting despite nitroglycerin you need to call 911.  4. At some point you should consider completing colonoscopy so we can adequately work up your anemia. This cannot be done until your cardiac work up is complete.  5. Return to the office in 2 months.

## 2020-07-16 NOTE — Progress Notes (Addendum)
Primary Care Physician: Celene Squibb, MD  Primary Gastroenterologist:  Garfield Cornea, MD   Chief Complaint  Patient presents with  . Dysphagia    worse since procedure    HPI: Gregory Alvarado is a 69 y.o. male here for follow-up of EGD and colonoscopy done back in August 2021.  Patient has a history of GI bleed back in February 2021, intermittent melena.  Hemoglobin 5.4, ferritin 4, folate 3.7.Patient had EGD 10/2019 with multiple cratered gastric ulcers, gastritis/duodenitis. Path showed H.pylori, and patient was treated with PPI BID, amoxicillin, biaxin. At time of discharge his Hgb was 8.4. He was also noted to take 4 BC powders daily without PPI.   EGD and colonoscopy August 2021.  Patient had poor bowel prep.  2 polyps removed, tubular adenoma.  Advised to follow-up colonoscopy later this year.  EGD showed previous gastric ulcers completely healed.  Gastric erythema somewhat patchy status post biopsy showed persistent H. pylori.  Esophagus appeared normal, status post dilation.  Patient reports completing pantoprazole twice daily along with Pepto-Bismol/tetracycline/metronidazole for repeat H. pylori treatment.  Continues Pantoprazole twice a day.  Sometimes spicy food causes heartburn.  Reported dysphagia to nursing staff today but denied when I spoke with him.  Saw his cardiologist last week.  Patient complains of exertional chest pain.  Associated with achy pain in both arms when it happens.  Short of breath with minimal exertion.  Sleeps in a recliner.  States he was provided nitroglycerin last week.  It is helping with the chest pain that he gets with exertion.  Follows up with cardiology on November 16.      From a GI standpoint he believes his heartburn is fairly well controlled.  He continues to have a bowel movement about every 2 to 3 days.  When he starts going again he has several bowel movements within a day.  Sometimes uses prune juice.  Does not feel like he  needs a laxative and declines a laxative at this time.  Denies melena or rectal bleeding.  No abdominal pain.  At this time he declines colonoscopy, stating he has never been to have one again.  Discussed at length with him that his colonoscopy was incomplete because of his persistent anemia would be concerned that we could not found the source for his anemia and completing a colonoscopy is the next step and then potentially small bowel capsule endoscopy.  First and foremost however his chest pain needs to be addressed.   Current Outpatient Medications  Medication Sig Dispense Refill  . acetaminophen (TYLENOL) 325 MG tablet Take 2 tablets (650 mg total) by mouth every 6 (six) hours as needed for mild pain (or Fever >/= 101). 12 tablet 0  . albuterol (VENTOLIN HFA) 108 (90 Base) MCG/ACT inhaler Inhale 2 puffs into the lungs every 6 (six) hours as needed for wheezing or shortness of breath. 18 g 0  . atorvastatin (LIPITOR) 40 MG tablet Take 40 mg by mouth at bedtime.    . cyanocobalamin (,VITAMIN B-12,) 1000 MCG/ML injection Inject 1,000 mcg into the muscle every 30 (thirty) days.     . folic acid (FOLVITE) 1 MG tablet Take 1 tablet (1 mg total) by mouth daily. (Patient taking differently: Take 1 mg by mouth at bedtime. ) 30 tablet 2  . hydrochlorothiazide (HYDRODIURIL) 12.5 MG tablet Take 12.5 mg by mouth at bedtime.     . insulin NPH-regular Human (NOVOLIN 70/30) (70-30) 100  UNIT/ML injection Inject 50 Units into the skin in the morning and at bedtime.     . Insulin Pen Needle (B-D ULTRAFINE III SHORT PEN) 31G X 8 MM MISC 1 each by Does not apply route as directed. 100 each 3  . INSULIN SYRINGE .5CC/29G 29G X 1/2" 0.5 ML MISC Use to inject insulin 2 times a day 100 each 3  . isosorbide mononitrate (IMDUR) 30 MG 24 hr tablet Take 1 tablet (30 mg total) by mouth daily. May take 2 if needed 180 tablet 3  . lisinopril (PRINIVIL,ZESTRIL) 20 MG tablet Take 1 tablet (20 mg total) by mouth daily. 30 tablet 6    . nitroGLYCERIN (NITROSTAT) 0.4 MG SL tablet Place 1 tablet (0.4 mg total) under the tongue every 5 (five) minutes as needed for chest pain. 25 tablet 3  . ONE TOUCH ULTRA TEST test strip TEST FOUR TIMES DAILY AS DIRECTED. 150 each 5  . pantoprazole (PROTONIX) 40 MG tablet Take 1 tablet (40 mg total) by mouth 2 (two) times daily before a meal. 60 tablet 5  . traMADol (ULTRAM) 50 MG tablet Take 100 mg by mouth in the morning and at bedtime.     Marland Kitchen zolpidem (AMBIEN) 10 MG tablet Take 10 mg by mouth at bedtime.      No current facility-administered medications for this visit.    Allergies as of 07/16/2020 - Review Complete 07/16/2020  Allergen Reaction Noted  . Caduet [amlodipine-atorvastatin] Swelling 09/18/2015    ROS:  General: Negative for anorexia, weight loss, fever, chills, fatigue, weakness. ENT: Negative for hoarseness, difficulty swallowing , nasal congestion. CV: Positive for chest pain, angina, palpitations, positive dyspnea on exertion, positive peripheral edema.  See HPI Respiratory: Negative for dyspnea at rest, positive dyspnea on exertion, positive ough, sputum, positive wheezing.  GI: See history of present illness. GU:  Negative for dysuria, hematuria, urinary incontinence, urinary frequency, nocturnal urination.  Endo: Negative for unusual weight change.    Physical Examination:   BP 127/62   Pulse (!) 109   Temp (!) 96.8 F (36 C) (Temporal)   Ht 6' (1.829 m)   Wt 294 lb 3.2 oz (133.4 kg)   BMI 39.90 kg/m   General: Chronically ill-appearing male in no acute distress.  With minimal ambulation within the exam room he becomes short of breath.  Denies chest pain at this time.  Eyes: No icterus. Mouth: masked Lungs: Scattered wheezes Heart: Regular rate and rhythm, no murmurs rubs or gallops.  Abdomen: Bowel sounds are normal, nontender, nondistended, no hepatosplenomegaly or masses, no abdominal bruits or hernia , no rebound or guarding.   Extremities: 1+  lower extremity edema. No clubbing or deformities. Neuro: Alert and oriented x 4   Skin: Warm and dry, no jaundice.   Psych: Alert and cooperative, normal mood and affect.  Labs:  Lab Results  Component Value Date   WBC 10.7 (H) 07/09/2020   HGB 8.4 (L) 07/09/2020   HCT 31.3 (L) 07/09/2020   MCV 84.6 07/09/2020   PLT 343 07/09/2020   Lab Results  Component Value Date   CREATININE 1.07 07/09/2020   BUN 16 07/09/2020   NA 134 (L) 07/09/2020   K 4.2 07/09/2020   CL 101 07/09/2020   CO2 27 07/09/2020   Lab Results  Component Value Date   IRON 12 (L) 10/26/2019   TIBC 516 (H) 10/26/2019   FERRITIN 4 (L) 10/26/2019   Lab Results  Component Value Date   FOLATE 3.7 (L)  10/26/2019     Imaging Studies: No results found.  Impression/plan: 69 year old male with diabetes, COPD, symptomatic iron deficiency anemia, history of gastric ulcers and H. pylori presenting for follow-up.  Patient was seen in February when he presented with symptomatic anemia/melena and found to have cratered gastric ulcers/duodenitis.  Path showed H. pylori.  Also aspirin powder use at that time.  Treated with amoxicillin/Biaxin/PPI.  Received 3 units of packed red blood cells during February 2021 admission.  At time of discharge his hemoglobin was in the 8 range.  Also started on folic acid for folate deficiency.  Patient's current hemoglobin remains in the mid 8 range.  He has received 2 iron infusions as well back in September under the direction of Dr. [place] Crane.  EGD in August showed completely healed gastric ulcers but continued to have H. pylori gastritis.  He completed treatment with PPI, Pepto-Bismol, tetracycline, metronidazole.  Colonoscopy was attempted, prep was poor.  2 polyps removed, pathology showed tubular adenoma.  Advised follow-up colonoscopy this year.  Discussed with patient at length, persisting anemia unexplained at this point.  He denies melena or rectal bleeding.  His colon has not been  cleared given poor bowel prep recently.  Patient currently declines colonoscopy although may be open to the possibility in the future.  If, Colonoscopy were unremarkable, capsule endoscopy would be needed to complete GI work-up.  Also will check to make sure H. pylori has been eradicated, H. pylori breath test scheduled after holding pantoprazole for 2 weeks.  We will request copy of labs from PCP as patient believes his hemoglobin may have improved in the interim and now has fallen again.  Unfortunately the only hemoglobins I can see are from February and October.  Patient complains of chest pain with exertion which improves with nitroglycerin.  Recent cardiology evaluation with follow-up pending.  They are contemplating catheterization.  We will have patient follow-up in 2 months with [place].

## 2020-07-17 ENCOUNTER — Telehealth (HOSPITAL_COMMUNITY): Payer: Self-pay | Admitting: *Deleted

## 2020-07-17 DIAGNOSIS — I10 Essential (primary) hypertension: Secondary | ICD-10-CM | POA: Diagnosis not present

## 2020-07-17 DIAGNOSIS — D509 Iron deficiency anemia, unspecified: Secondary | ICD-10-CM | POA: Diagnosis not present

## 2020-07-17 DIAGNOSIS — J441 Chronic obstructive pulmonary disease with (acute) exacerbation: Secondary | ICD-10-CM | POA: Diagnosis not present

## 2020-07-17 DIAGNOSIS — Z720 Tobacco use: Secondary | ICD-10-CM | POA: Diagnosis not present

## 2020-07-17 DIAGNOSIS — G4700 Insomnia, unspecified: Secondary | ICD-10-CM | POA: Diagnosis not present

## 2020-07-17 NOTE — Telephone Encounter (Signed)
Order from Dr. [place] Crane received to transfuse 2 units of PRBC.  Spoke with patient and he does not wish to receive blood at this time.  Dr. Nevada Crane notified.

## 2020-07-22 ENCOUNTER — Other Ambulatory Visit: Payer: Self-pay

## 2020-07-22 ENCOUNTER — Emergency Department (HOSPITAL_COMMUNITY): Payer: Medicare Other

## 2020-07-22 ENCOUNTER — Inpatient Hospital Stay (HOSPITAL_COMMUNITY)
Admission: EM | Admit: 2020-07-22 | Discharge: 2020-07-30 | DRG: 378 | Disposition: A | Discharge status: Home / Self Care | Payer: Medicare Other | Attending: Internal Medicine | Admitting: Internal Medicine

## 2020-07-22 ENCOUNTER — Other Ambulatory Visit (HOSPITAL_COMMUNITY)
Admission: RE | Admit: 2020-07-22 | Discharge: 2020-07-22 | Disposition: A | Discharge status: Home / Self Care | Payer: Medicare Other | Source: Ambulatory Visit | Attending: Gastroenterology | Admitting: Gastroenterology

## 2020-07-22 ENCOUNTER — Encounter (HOSPITAL_COMMUNITY)
Admission: RE | Admit: 2020-07-22 | Discharge: 2020-07-22 | Disposition: A | Discharge status: Home / Self Care | Payer: Medicare Other | Source: Ambulatory Visit | Attending: Internal Medicine | Admitting: Internal Medicine

## 2020-07-22 ENCOUNTER — Encounter (HOSPITAL_COMMUNITY): Payer: Self-pay | Admitting: Nephrology

## 2020-07-22 DIAGNOSIS — I209 Angina pectoris, unspecified: Secondary | ICD-10-CM | POA: Diagnosis present

## 2020-07-22 DIAGNOSIS — M7989 Other specified soft tissue disorders: Secondary | ICD-10-CM | POA: Diagnosis present

## 2020-07-22 DIAGNOSIS — R0602 Shortness of breath: Secondary | ICD-10-CM

## 2020-07-22 DIAGNOSIS — R0682 Tachypnea, not elsewhere classified: Secondary | ICD-10-CM | POA: Diagnosis present

## 2020-07-22 DIAGNOSIS — J449 Chronic obstructive pulmonary disease, unspecified: Secondary | ICD-10-CM | POA: Diagnosis present

## 2020-07-22 DIAGNOSIS — Z794 Long term (current) use of insulin: Secondary | ICD-10-CM

## 2020-07-22 DIAGNOSIS — K5520 Angiodysplasia of colon without hemorrhage: Secondary | ICD-10-CM | POA: Diagnosis not present

## 2020-07-22 DIAGNOSIS — K56600 Partial intestinal obstruction, unspecified as to cause: Secondary | ICD-10-CM | POA: Diagnosis present

## 2020-07-22 DIAGNOSIS — K8020 Calculus of gallbladder without cholecystitis without obstruction: Secondary | ICD-10-CM | POA: Diagnosis not present

## 2020-07-22 DIAGNOSIS — R000 Tachycardia, unspecified: Secondary | ICD-10-CM | POA: Diagnosis present

## 2020-07-22 DIAGNOSIS — Z720 Tobacco use: Secondary | ICD-10-CM | POA: Diagnosis present

## 2020-07-22 DIAGNOSIS — K5521 Angiodysplasia of colon with hemorrhage: Secondary | ICD-10-CM | POA: Diagnosis present

## 2020-07-22 DIAGNOSIS — F17200 Nicotine dependence, unspecified, uncomplicated: Secondary | ICD-10-CM

## 2020-07-22 DIAGNOSIS — E66812 Obesity, class 2: Secondary | ICD-10-CM

## 2020-07-22 DIAGNOSIS — K56609 Unspecified intestinal obstruction, unspecified as to partial versus complete obstruction: Secondary | ICD-10-CM

## 2020-07-22 DIAGNOSIS — I130 Hypertensive heart and chronic kidney disease with heart failure and stage 1 through stage 4 chronic kidney disease, or unspecified chronic kidney disease: Secondary | ICD-10-CM | POA: Diagnosis present

## 2020-07-22 DIAGNOSIS — D62 Acute posthemorrhagic anemia: Secondary | ICD-10-CM | POA: Diagnosis present

## 2020-07-22 DIAGNOSIS — E6609 Other obesity due to excess calories: Secondary | ICD-10-CM | POA: Diagnosis not present

## 2020-07-22 DIAGNOSIS — E785 Hyperlipidemia, unspecified: Secondary | ICD-10-CM | POA: Diagnosis not present

## 2020-07-22 DIAGNOSIS — I5042 Chronic combined systolic (congestive) and diastolic (congestive) heart failure: Secondary | ICD-10-CM | POA: Diagnosis present

## 2020-07-22 DIAGNOSIS — E1165 Type 2 diabetes mellitus with hyperglycemia: Secondary | ICD-10-CM | POA: Diagnosis not present

## 2020-07-22 DIAGNOSIS — B9681 Helicobacter pylori [H. pylori] as the cause of diseases classified elsewhere: Secondary | ICD-10-CM | POA: Diagnosis present

## 2020-07-22 DIAGNOSIS — R059 Cough, unspecified: Secondary | ICD-10-CM | POA: Diagnosis not present

## 2020-07-22 DIAGNOSIS — K921 Melena: Secondary | ICD-10-CM | POA: Diagnosis present

## 2020-07-22 DIAGNOSIS — R195 Other fecal abnormalities: Secondary | ICD-10-CM | POA: Diagnosis not present

## 2020-07-22 DIAGNOSIS — K56699 Other intestinal obstruction unspecified as to partial versus complete obstruction: Secondary | ICD-10-CM | POA: Diagnosis not present

## 2020-07-22 DIAGNOSIS — Z6839 Body mass index (BMI) 39.0-39.9, adult: Secondary | ICD-10-CM | POA: Diagnosis not present

## 2020-07-22 DIAGNOSIS — R9431 Abnormal electrocardiogram [ECG] [EKG]: Secondary | ICD-10-CM | POA: Diagnosis present

## 2020-07-22 DIAGNOSIS — K254 Chronic or unspecified gastric ulcer with hemorrhage: Secondary | ICD-10-CM | POA: Diagnosis not present

## 2020-07-22 DIAGNOSIS — I10 Essential (primary) hypertension: Secondary | ICD-10-CM | POA: Diagnosis present

## 2020-07-22 DIAGNOSIS — K31811 Angiodysplasia of stomach and duodenum with bleeding: Secondary | ICD-10-CM | POA: Diagnosis not present

## 2020-07-22 DIAGNOSIS — D649 Anemia, unspecified: Secondary | ICD-10-CM | POA: Diagnosis not present

## 2020-07-22 DIAGNOSIS — K922 Gastrointestinal hemorrhage, unspecified: Secondary | ICD-10-CM

## 2020-07-22 DIAGNOSIS — Z86010 Personal history of colonic polyps: Secondary | ICD-10-CM

## 2020-07-22 DIAGNOSIS — Z888 Allergy status to other drugs, medicaments and biological substances status: Secondary | ICD-10-CM

## 2020-07-22 DIAGNOSIS — I5032 Chronic diastolic (congestive) heart failure: Secondary | ICD-10-CM | POA: Diagnosis not present

## 2020-07-22 DIAGNOSIS — E538 Deficiency of other specified B group vitamins: Secondary | ICD-10-CM | POA: Diagnosis not present

## 2020-07-22 DIAGNOSIS — J9811 Atelectasis: Secondary | ICD-10-CM | POA: Diagnosis not present

## 2020-07-22 DIAGNOSIS — E119 Type 2 diabetes mellitus without complications: Secondary | ICD-10-CM

## 2020-07-22 DIAGNOSIS — D509 Iron deficiency anemia, unspecified: Secondary | ICD-10-CM | POA: Diagnosis not present

## 2020-07-22 DIAGNOSIS — K6389 Other specified diseases of intestine: Secondary | ICD-10-CM | POA: Diagnosis not present

## 2020-07-22 DIAGNOSIS — K567 Ileus, unspecified: Secondary | ICD-10-CM | POA: Diagnosis present

## 2020-07-22 DIAGNOSIS — Z8619 Personal history of other infectious and parasitic diseases: Secondary | ICD-10-CM | POA: Diagnosis not present

## 2020-07-22 DIAGNOSIS — E1122 Type 2 diabetes mellitus with diabetic chronic kidney disease: Secondary | ICD-10-CM | POA: Diagnosis present

## 2020-07-22 DIAGNOSIS — K5900 Constipation, unspecified: Secondary | ICD-10-CM | POA: Diagnosis not present

## 2020-07-22 DIAGNOSIS — I340 Nonrheumatic mitral (valve) insufficiency: Secondary | ICD-10-CM | POA: Diagnosis not present

## 2020-07-22 DIAGNOSIS — J441 Chronic obstructive pulmonary disease with (acute) exacerbation: Secondary | ICD-10-CM | POA: Diagnosis present

## 2020-07-22 DIAGNOSIS — K219 Gastro-esophageal reflux disease without esophagitis: Secondary | ICD-10-CM | POA: Diagnosis present

## 2020-07-22 DIAGNOSIS — N182 Chronic kidney disease, stage 2 (mild): Secondary | ICD-10-CM | POA: Diagnosis not present

## 2020-07-22 DIAGNOSIS — Z79899 Other long term (current) drug therapy: Secondary | ICD-10-CM

## 2020-07-22 DIAGNOSIS — Z20822 Contact with and (suspected) exposure to covid-19: Secondary | ICD-10-CM | POA: Diagnosis present

## 2020-07-22 DIAGNOSIS — Z833 Family history of diabetes mellitus: Secondary | ICD-10-CM

## 2020-07-22 DIAGNOSIS — K31819 Angiodysplasia of stomach and duodenum without bleeding: Secondary | ICD-10-CM | POA: Diagnosis not present

## 2020-07-22 DIAGNOSIS — J42 Unspecified chronic bronchitis: Secondary | ICD-10-CM

## 2020-07-22 DIAGNOSIS — R079 Chest pain, unspecified: Secondary | ICD-10-CM | POA: Diagnosis not present

## 2020-07-22 DIAGNOSIS — R590 Localized enlarged lymph nodes: Secondary | ICD-10-CM | POA: Diagnosis present

## 2020-07-22 DIAGNOSIS — M1990 Unspecified osteoarthritis, unspecified site: Secondary | ICD-10-CM | POA: Diagnosis not present

## 2020-07-22 DIAGNOSIS — F17210 Nicotine dependence, cigarettes, uncomplicated: Secondary | ICD-10-CM | POA: Diagnosis present

## 2020-07-22 DIAGNOSIS — E782 Mixed hyperlipidemia: Secondary | ICD-10-CM | POA: Diagnosis not present

## 2020-07-22 DIAGNOSIS — D72829 Elevated white blood cell count, unspecified: Secondary | ICD-10-CM | POA: Diagnosis present

## 2020-07-22 DIAGNOSIS — R109 Unspecified abdominal pain: Secondary | ICD-10-CM | POA: Diagnosis not present

## 2020-07-22 DIAGNOSIS — Z8249 Family history of ischemic heart disease and other diseases of the circulatory system: Secondary | ICD-10-CM

## 2020-07-22 DIAGNOSIS — Z0189 Encounter for other specified special examinations: Secondary | ICD-10-CM

## 2020-07-22 DIAGNOSIS — D500 Iron deficiency anemia secondary to blood loss (chronic): Secondary | ICD-10-CM | POA: Diagnosis not present

## 2020-07-22 LAB — BRAIN NATRIURETIC PEPTIDE: B Natriuretic Peptide: 165 pg/mL — ABNORMAL HIGH (ref 0.0–100.0)

## 2020-07-22 LAB — CBC WITH DIFFERENTIAL/PLATELET
Abs Immature Granulocytes: 0.04 10*3/uL (ref 0.00–0.07)
Basophils Absolute: 0 10*3/uL (ref 0.0–0.1)
Basophils Relative: 0 %
Eosinophils Absolute: 0.3 10*3/uL (ref 0.0–0.5)
Eosinophils Relative: 3 %
HCT: 22.6 % — ABNORMAL LOW (ref 39.0–52.0)
Hemoglobin: 6.2 g/dL — CL (ref 13.0–17.0)
Immature Granulocytes: 0 %
Lymphocytes Relative: 10 %
Lymphs Abs: 1 10*3/uL (ref 0.7–4.0)
MCH: 21.6 pg — ABNORMAL LOW (ref 26.0–34.0)
MCHC: 27.4 g/dL — ABNORMAL LOW (ref 30.0–36.0)
MCV: 78.7 fL — ABNORMAL LOW (ref 80.0–100.0)
Monocytes Absolute: 0.7 10*3/uL (ref 0.1–1.0)
Monocytes Relative: 7 %
Neutro Abs: 8.2 10*3/uL — ABNORMAL HIGH (ref 1.7–7.7)
Neutrophils Relative %: 80 %
Platelets: 264 10*3/uL (ref 150–400)
RBC: 2.87 MIL/uL — ABNORMAL LOW (ref 4.22–5.81)
RDW: 20.8 % — ABNORMAL HIGH (ref 11.5–15.5)
WBC: 10.3 10*3/uL (ref 4.0–10.5)
nRBC: 0 % (ref 0.0–0.2)

## 2020-07-22 LAB — COMPREHENSIVE METABOLIC PANEL
ALT: 10 U/L (ref 0–44)
AST: 10 U/L — ABNORMAL LOW (ref 15–41)
Albumin: 3.6 g/dL (ref 3.5–5.0)
Alkaline Phosphatase: 62 U/L (ref 38–126)
Anion gap: 8 (ref 5–15)
BUN: 15 mg/dL (ref 8–23)
CO2: 28 mmol/L (ref 22–32)
Calcium: 9 mg/dL (ref 8.9–10.3)
Chloride: 98 mmol/L (ref 98–111)
Creatinine, Ser: 1.08 mg/dL (ref 0.61–1.24)
GFR, Estimated: >60 mL/min (ref >60)
Glucose, Bld: 170 mg/dL — ABNORMAL HIGH (ref 70–99)
Potassium: 4.4 mmol/L (ref 3.5–5.1)
Sodium: 134 mmol/L — ABNORMAL LOW (ref 135–145)
Total Bilirubin: 0.4 mg/dL (ref 0.3–1.2)
Total Protein: 6.8 g/dL (ref 6.5–8.1)

## 2020-07-22 LAB — IRON AND TIBC
Iron: 13 ug/dL — ABNORMAL LOW (ref 45–182)
Saturation Ratios: 3 % — ABNORMAL LOW (ref 17.9–39.5)
TIBC: 481 ug/dL — ABNORMAL HIGH (ref 250–450)
UIBC: 468 ug/dL

## 2020-07-22 LAB — FERRITIN: Ferritin: 4 ng/mL — ABNORMAL LOW (ref 24–336)

## 2020-07-22 LAB — PREPARE RBC (CROSSMATCH)

## 2020-07-22 LAB — HEMOGLOBIN AND HEMATOCRIT, BLOOD
HCT: 23.2 % — ABNORMAL LOW (ref 39.0–52.0)
Hemoglobin: 6.4 g/dL — CL (ref 13.0–17.0)

## 2020-07-22 LAB — RETICULOCYTES
Immature Retic Fract: 23.7 % — ABNORMAL HIGH (ref 2.3–15.9)
RBC.: 2.91 MIL/uL — ABNORMAL LOW (ref 4.22–5.81)
Retic Count, Absolute: 60.5 10*3/uL (ref 19.0–186.0)
Retic Ct Pct: 2.1 % (ref 0.4–3.1)

## 2020-07-22 LAB — RESP PANEL BY RT PCR (RSV, FLU A&B, COVID)
Influenza A by PCR: NEGATIVE
Influenza B by PCR: NEGATIVE
Respiratory Syncytial Virus by PCR: NEGATIVE
SARS Coronavirus 2 by RT PCR: NEGATIVE

## 2020-07-22 LAB — TROPONIN I (HIGH SENSITIVITY)
Troponin I (High Sensitivity): 41 ng/L — ABNORMAL HIGH (ref <18)
Troponin I (High Sensitivity): 50 ng/L — ABNORMAL HIGH (ref <18)

## 2020-07-22 LAB — SAMPLE TO BLOOD BANK

## 2020-07-22 LAB — FOLATE: Folate: 5.4 ng/mL — ABNORMAL LOW (ref >5.9)

## 2020-07-22 LAB — CBG MONITORING, ED: Glucose-Capillary: 163 mg/dL — ABNORMAL HIGH (ref 70–99)

## 2020-07-22 LAB — POC OCCULT BLOOD, ED: Fecal Occult Bld: POSITIVE — AB

## 2020-07-22 LAB — VITAMIN B12: Vitamin B-12: 201 pg/mL (ref 180–914)

## 2020-07-22 MED ORDER — TRAMADOL HCL 50 MG PO TABS
50.0000 mg | ORAL_TABLET | Freq: Two times a day (BID) | ORAL | Status: DC | PRN
  Administered 2020-07-23 – 2020-07-24 (×2): 50 mg via ORAL
  Filled 2020-07-22 (×2): qty 1

## 2020-07-22 MED ORDER — AEROCHAMBER Z-STAT PLUS/MEDIUM MISC
1.0000 | Freq: Once | Status: DC
  Filled 2020-07-22: qty 1

## 2020-07-22 MED ORDER — ZOLPIDEM TARTRATE 5 MG PO TABS
5.0000 mg | ORAL_TABLET | Freq: Every evening | ORAL | Status: DC | PRN
  Administered 2020-07-22 – 2020-07-29 (×4): 5 mg via ORAL
  Filled 2020-07-22 (×5): qty 1

## 2020-07-22 MED ORDER — NICOTINE 21 MG/24HR TD PT24
21.0000 mg | MEDICATED_PATCH | Freq: Every day | TRANSDERMAL | Status: DC
  Administered 2020-07-23 – 2020-07-30 (×8): 21 mg via TRANSDERMAL
  Filled 2020-07-22 (×8): qty 1

## 2020-07-22 MED ORDER — LISINOPRIL 10 MG PO TABS
20.0000 mg | ORAL_TABLET | Freq: Every day | ORAL | Status: DC
  Administered 2020-07-22 – 2020-07-24 (×3): 20 mg via ORAL
  Filled 2020-07-22 (×3): qty 2

## 2020-07-22 MED ORDER — ALBUTEROL SULFATE HFA 108 (90 BASE) MCG/ACT IN AERS: 2.0000 | INHALATION_SPRAY | Freq: Four times a day (QID) | RESPIRATORY_TRACT | Status: DC | PRN

## 2020-07-22 MED ORDER — NICOTINE 21 MG/24HR TD PT24
21.0000 mg | MEDICATED_PATCH | Freq: Once | TRANSDERMAL | Status: AC
  Administered 2020-07-22: 21 mg via TRANSDERMAL
  Filled 2020-07-22: qty 1

## 2020-07-22 MED ORDER — ONDANSETRON HCL 4 MG PO TABS: 4.0000 mg | ORAL_TABLET | Freq: Four times a day (QID) | ORAL | Status: DC | PRN

## 2020-07-22 MED ORDER — POLYETHYLENE GLYCOL 3350 17 G PO PACK
17.0000 g | PACK | Freq: Every day | ORAL | Status: DC | PRN
  Administered 2020-07-27 (×2): 17 g via ORAL
  Filled 2020-07-22 (×2): qty 1

## 2020-07-22 MED ORDER — INSULIN DETEMIR 100 UNIT/ML ~~LOC~~ SOLN
15.0000 [IU] | Freq: Two times a day (BID) | SUBCUTANEOUS | Status: DC
  Administered 2020-07-22 – 2020-07-29 (×13): 15 [IU] via SUBCUTANEOUS
  Filled 2020-07-22 (×18): qty 0.15

## 2020-07-22 MED ORDER — SODIUM CHLORIDE 0.9 % IV SOLN
10.0000 mL/h | Freq: Once | INTRAVENOUS | Status: AC
  Administered 2020-07-22: 10 mL/h via INTRAVENOUS

## 2020-07-22 MED ORDER — FUROSEMIDE 10 MG/ML IJ SOLN
20.0000 mg | Freq: Once | INTRAMUSCULAR | Status: AC
  Administered 2020-07-22: 20 mg via INTRAVENOUS
  Filled 2020-07-22: qty 2

## 2020-07-22 MED ORDER — INSULIN DETEMIR 100 UNIT/ML ~~LOC~~ SOLN: 30.0000 [IU] | Freq: Two times a day (BID) | SUBCUTANEOUS | Status: DC

## 2020-07-22 MED ORDER — PANTOPRAZOLE SODIUM 40 MG IV SOLR
40.0000 mg | Freq: Once | INTRAVENOUS | Status: AC
  Administered 2020-07-22: 40 mg via INTRAVENOUS
  Filled 2020-07-22: qty 40

## 2020-07-22 MED ORDER — ATORVASTATIN CALCIUM 40 MG PO TABS
40.0000 mg | ORAL_TABLET | Freq: Every day | ORAL | Status: DC
  Administered 2020-07-22 – 2020-07-23 (×2): 40 mg via ORAL
  Filled 2020-07-22 (×2): qty 1

## 2020-07-22 MED ORDER — SODIUM CHLORIDE 0.9% FLUSH
3.0000 mL | Freq: Two times a day (BID) | INTRAVENOUS | Status: DC
  Administered 2020-07-23 – 2020-07-29 (×13): 3 mL via INTRAVENOUS

## 2020-07-22 MED ORDER — ONDANSETRON HCL 4 MG/2ML IJ SOLN
4.0000 mg | Freq: Four times a day (QID) | INTRAMUSCULAR | Status: DC | PRN
  Administered 2020-07-23: 4 mg via INTRAVENOUS
  Filled 2020-07-22: qty 2

## 2020-07-22 MED ORDER — INSULIN DETEMIR 100 UNIT/ML ~~LOC~~ SOLN
20.0000 [IU] | Freq: Two times a day (BID) | SUBCUTANEOUS | Status: DC
  Filled 2020-07-22 (×2): qty 0.2

## 2020-07-22 MED ORDER — INSULIN ASPART 100 UNIT/ML ~~LOC~~ SOLN
0.0000 [IU] | SUBCUTANEOUS | Status: DC
  Administered 2020-07-22 – 2020-07-23 (×4): 4 [IU] via SUBCUTANEOUS
  Administered 2020-07-24 (×2): 3 [IU] via SUBCUTANEOUS
  Administered 2020-07-24: 4 [IU] via SUBCUTANEOUS
  Administered 2020-07-24: 3 [IU] via SUBCUTANEOUS
  Administered 2020-07-24: 4 [IU] via SUBCUTANEOUS
  Administered 2020-07-25 – 2020-07-27 (×3): 3 [IU] via SUBCUTANEOUS
  Administered 2020-07-27: 4 [IU] via SUBCUTANEOUS
  Filled 2020-07-22: qty 1

## 2020-07-22 MED ORDER — ISOSORBIDE MONONITRATE ER 60 MG PO TB24
30.0000 mg | ORAL_TABLET | Freq: Every day | ORAL | Status: DC
  Administered 2020-07-23 – 2020-07-30 (×8): 30 mg via ORAL
  Filled 2020-07-22 (×10): qty 1

## 2020-07-22 MED ORDER — FOLIC ACID 1 MG PO TABS
1.0000 mg | ORAL_TABLET | Freq: Every day | ORAL | Status: DC
  Administered 2020-07-22 – 2020-07-23 (×2): 1 mg via ORAL
  Filled 2020-07-22 (×2): qty 1

## 2020-07-22 MED ORDER — SODIUM CHLORIDE 0.9% FLUSH: 3.0000 mL | INTRAVENOUS | Status: DC | PRN

## 2020-07-22 MED ORDER — ALBUTEROL SULFATE HFA 108 (90 BASE) MCG/ACT IN AERS
2.0000 | INHALATION_SPRAY | Freq: Once | RESPIRATORY_TRACT | Status: AC
  Administered 2020-07-22: 2 via RESPIRATORY_TRACT
  Filled 2020-07-22: qty 6.7

## 2020-07-22 MED ORDER — ALBUTEROL SULFATE HFA 108 (90 BASE) MCG/ACT IN AERS
2.0000 | INHALATION_SPRAY | Freq: Three times a day (TID) | RESPIRATORY_TRACT | Status: DC
  Administered 2020-07-23: 2 via RESPIRATORY_TRACT

## 2020-07-22 MED ORDER — HYDROCHLOROTHIAZIDE 25 MG PO TABS
12.5000 mg | ORAL_TABLET | Freq: Every day | ORAL | Status: DC
  Administered 2020-07-22: 12.5 mg via ORAL
  Filled 2020-07-22 (×2): qty 1

## 2020-07-22 MED ORDER — SODIUM CHLORIDE 0.9 % IV SOLN: 250.0000 mL | INTRAVENOUS | Status: DC | PRN

## 2020-07-22 MED ORDER — PANTOPRAZOLE SODIUM 40 MG IV SOLR
40.0000 mg | Freq: Two times a day (BID) | INTRAVENOUS | Status: DC
  Administered 2020-07-23 – 2020-07-29 (×13): 40 mg via INTRAVENOUS
  Filled 2020-07-22 (×13): qty 40

## 2020-07-22 NOTE — ED Provider Notes (Signed)
[facility] EMERGENCY DEPARTMENT Provider Note   CSN: [id number] Arrival date & time: 07/22/20  1634     History Chief Complaint  Patient presents with  . Shortness of Breath  . low hemoglobin    Keyron Pokorski Graf is a 69 y.o. male with a past medical history of tobacco abuse, CHF, CKD, COPD, DM, hypertension, obesity, dyslipidemia, GI bleed, who presents today for evaluation of anemia.  He had his hemoglobin checked today and was told it was 6.4.  He reports that over the past 2 weeks he has had shortness of breath and fatigue.  He is not vaccinated against Covid.  He denies any known Covid contacts.  He reports leg swelling consistent with his baseline.  He is not having any pain in his abdomen.  He states that since his last GI bleed he has had rust colored stools that is been unchanged.  He denies any known fevers.  He states that he smokes "a lot."  He does not normally wear oxygen.  He does wish for blood transfusion if needed.     His shortness of breath is made worse with moving around and any exertion.     HPI     Past Medical History:  Diagnosis Date  . Anemia   . Arthritis    left shoulder  . COPD (chronic obstructive pulmonary disease) ([place])   . Diabetes mellitus without complication ([place])   . Dyslipidemia   . Hypertension   . Insomnia   . Insomnia   . Morbid obesity ([place])   . Tobacco use     Patient Active Problem List   Diagnosis Date Noted  . History of colonic polyps 07/16/2020  . Chronic diastolic CHF (congestive heart failure) ([place]) 07/09/2020  . Exertional angina ([place]) 07/09/2020  . CRI (chronic renal insufficiency), stage 2 (mild) 07/09/2020  . Colon cancer screening 01/23/2020  . PUD (peptic ulcer disease) 01/23/2020  . Esophageal dysphagia 01/23/2020  . H. pylori infection 01/23/2020  . GI bleed 10/27/2019  . Rectal bleeding   . Symptomatic anemia 10/26/2019  . Obesity, Class III, BMI 40-49.9 (morbid obesity) ([place]) 10/26/2019  . Iron deficiency  anemia   . Melena   . Constipation   . Personal history of noncompliance with medical treatment, presenting hazards to health 02/24/2017  . Class 2 obesity due to excess calories with serious comorbidity and body mass index (BMI) of 35.0 to 35.9 in adult 01/05/2017  . Current smoker 01/05/2017  . Essential hypertension   . Insulin dependent type 2 diabetes mellitus ([place])   . COPD (chronic obstructive pulmonary disease) ([place])   . Arthritis   . Hyperlipidemia   . Insomnia     Past Surgical History:  Procedure Laterality Date  . BIOPSY  10/28/2019   Procedure: BIOPSY;  Surgeon: Danie Binder, MD;  Location: [facility] END[facility];  Service: Endoscopy;;  gastric  . BIOPSY  04/15/2020   Procedure: BIOPSY;  Surgeon: Daneil Dolin, MD;  Location: [facility] END[facility];  Service: Endoscopy;;  gastric  . COLON RESECTION  1990s?   DIVERTICULITIS  . COLONOSCOPY WITH PROPOFOL N/A 04/15/2020   Rourk: Poor colon prep. two 6 to 8 mm polyps removed from the hepatic flexure, tubular adenomas.  Repeat colonoscopy later this year.  . ESOPHAGOGASTRODUODENOSCOPY (EGD) WITH PROPOFOL N/A 10/28/2019   Dr. Oneida Alar: Multiple cratered gastric ulcers, gastritis/duodenitis.  Path showed H. pylori.  Patient treated with amoxicillin/Biaxin/PPI twice daily.  . ESOPHAGOGASTRODUODENOSCOPY (EGD) WITH PROPOFOL N/A 04/15/2020  Rourk: Patchy gastric erythema status post biopsy to document eradication of H. pylori.  Biopsy showed persistent H. pylori.  Previously noted gastric ulcer is completely healed.  Marland Kitchen HERNIA REPAIR     UHR  . MALONEY DILATION N/A 04/15/2020   Procedure: Venia Minks DILATION;  Surgeon: Daneil Dolin, MD;  Location: [facility];  Service: Endoscopy;  Laterality: N/A;  . POLYPECTOMY  04/15/2020   Procedure: POLYPECTOMY;  Surgeon: Daneil Dolin, MD;  Location: [facility];  Service: Endoscopy;;  colon  . ROTATOR CUFF REPAIR Bilateral   . TYMPANOPLASTY         Family History  Problem Relation Age of Onset  .  Diabetes Mother   . Heart attack Mother   . Heart attack Father   . Heart attack Brother   . Colon cancer Neg Hx     Social History   Tobacco Use  . Smoking status: Current Every Day Smoker    Packs/day: 2.00    Types: Cigarettes  . Smokeless tobacco: Never Used  Vaping Use  . Vaping Use: Never used  Substance Use Topics  . Alcohol use: Not Currently    Comment: rare  . Drug use: No    Home Medications Prior to Admission medications   Medication Sig Start Date End Date Taking? Authorizing Provider  acetaminophen (TYLENOL) 325 MG tablet Take 2 tablets (650 mg total) by mouth every 6 (six) hours as needed for mild pain (or Fever >/= 101). 10/29/19  Yes Emokpae, Courage, MD  albuterol (VENTOLIN HFA) 108 (90 Base) MCG/ACT inhaler Inhale 2 puffs into the lungs every 6 (six) hours as needed for wheezing or shortness of breath. 10/29/19  Yes Emokpae, Courage, MD  atorvastatin (LIPITOR) 40 MG tablet Take 40 mg by mouth at bedtime. 08/30/19  Yes [provider]  cyanocobalamin (,VITAMIN B-12,) 1000 MCG/ML injection Inject 1,000 mcg into the muscle every 30 (thirty) days.    Yes [provider]  folic acid (FOLVITE) 1 MG tablet Take 1 tablet (1 mg total) by mouth daily. Patient taking differently: Take 1 mg by mouth at bedtime.  10/30/19  Yes Emokpae, Courage, MD  hydrochlorothiazide (HYDRODIURIL) 12.5 MG tablet Take 12.5 mg by mouth at bedtime.  01/23/20  Yes [provider]  insulin NPH-regular Human (NOVOLIN 70/30) (70-30) 100 UNIT/ML injection Inject 50 Units into the skin in the morning and at bedtime. Inject 50 units in morning and 60 units every evening   Yes [provider]  isosorbide mononitrate (IMDUR) 30 MG 24 hr tablet Take 1 tablet (30 mg total) by mouth daily. May take 2 if needed 07/09/20 10/07/20 Yes Kilroy, Lurena Joiner K, PA-C  lisinopril (PRINIVIL,ZESTRIL) 20 MG tablet Take 1 tablet (20 mg total) by mouth daily. 06/09/17  Yes Nida, Marella Chimes, MD   nitroGLYCERIN (NITROSTAT) 0.4 MG SL tablet Place 1 tablet (0.4 mg total) under the tongue every 5 (five) minutes as needed for chest pain. 07/09/20 10/07/20 Yes Kilroy, Luke K, PA-C  pantoprazole (PROTONIX) 40 MG tablet Take 1 tablet (40 mg total) by mouth 2 (two) times daily before a meal. 04/25/20  Yes Mahala Menghini, PA-C  traMADol (ULTRAM) 50 MG tablet Take 50 mg by mouth in the morning and at bedtime.  09/20/19  Yes [provider]  zolpidem (AMBIEN) 10 MG tablet Take 10 mg by mouth at bedtime.    Yes [provider]  Insulin Pen Needle (B-D ULTRAFINE III SHORT PEN) 31G X 8 MM MISC 1  each by Does not apply route as directed. 01/05/17   Cassandria Anger, MD  INSULIN SYRINGE .5CC/29G 29G X 1/2" 0.5 ML MISC Use to inject insulin 2 times a day 02/24/17   Cassandria Anger, MD  ONE TOUCH ULTRA TEST test strip TEST FOUR TIMES DAILY AS DIRECTED. 05/21/17   Cassandria Anger, MD    Allergies    Caduet [amlodipine-atorvastatin]  Review of Systems   Review of Systems  Constitutional: Negative for chills and fever.  HENT: Negative for congestion.   Eyes: Negative for visual disturbance.  Respiratory: Positive for cough, chest tightness and shortness of breath.   Cardiovascular: Positive for chest pain. Negative for palpitations and leg swelling.  Gastrointestinal: Positive for blood in stool. Negative for abdominal pain.  Genitourinary: Negative for dysuria.  Musculoskeletal: Negative for back pain and neck pain.  Skin: Negative for color change, rash and wound.  Neurological: Negative for weakness and headaches.  Psychiatric/Behavioral: Negative for confusion.  All other systems reviewed and are negative.   Physical Exam Updated Vital Signs BP (!) 169/82   Pulse (!) 109   Temp 98.4 F (36.9 C) (Oral)   Resp (!) 22   SpO2 99%   Physical Exam Vitals and nursing note reviewed. Exam conducted with a chaperone present.  Constitutional:      Appearance: He is  obese. He is ill-appearing.  Eyes:     Conjunctiva/sclera: Conjunctivae normal.  Cardiovascular:     Rate and Rhythm: Normal rate.  Pulmonary:     Effort: Pulmonary effort is normal. Tachypnea present. No respiratory distress.     Breath sounds: Examination of the right-upper field reveals wheezing. Examination of the left-upper field reveals wheezing. Examination of the right-middle field reveals wheezing. Examination of the left-middle field reveals wheezing. Examination of the right-lower field reveals wheezing. Examination of the left-lower field reveals wheezing. Wheezing present.  Abdominal:     Palpations: Abdomen is soft.     Tenderness: There is no abdominal tenderness.  Genitourinary:    Comments: DRE with scant soft brown stool in the rectal vault.  Musculoskeletal:     Cervical back: Normal range of motion and neck supple.     Right lower leg: No tenderness. Edema present.     Left lower leg: No tenderness. Edema present.  Skin:    General: Skin is warm.     Coloration: Skin is pale.  Neurological:     Mental Status: He is alert.     Comments: Awake and alert, answers all questions appropriately.  Speech is not slurred.  Psychiatric:        Mood and Affect: Mood normal.        Behavior: Behavior normal.      ED Results / Procedures / Treatments   Labs (all labs ordered are listed, but only abnormal results are displayed) Labs Reviewed  CBC WITH DIFFERENTIAL/PLATELET - Abnormal; Notable for the following components:      Result Value   RBC 2.87 (*)    Hemoglobin 6.2 (*)    HCT 22.6 (*)    MCV 78.7 (*)    MCH 21.6 (*)    MCHC 27.4 (*)    RDW 20.8 (*)    Neutro Abs 8.2 (*)    All other components within normal limits  COMPREHENSIVE METABOLIC PANEL - Abnormal; Notable for the following components:   Sodium 134 (*)    Glucose, Bld 170 (*)    AST 10 (*)    All  other components within normal limits  RETICULOCYTES - Abnormal; Notable for the following components:    RBC. 2.91 (*)    Immature Retic Fract 23.7 (*)    All other components within normal limits  POC OCCULT BLOOD, ED - Abnormal; Notable for the following components:   Fecal Occult Bld POSITIVE (*)    All other components within normal limits  TROPONIN I (HIGH SENSITIVITY) - Abnormal; Notable for the following components:   Troponin I (High Sensitivity) 41 (*)    All other components within normal limits  RESP PANEL BY RT PCR (RSV, FLU A&B, COVID)  VITAMIN B12  FOLATE  IRON AND TIBC  FERRITIN  BRAIN NATRIURETIC PEPTIDE  TYPE AND SCREEN  PREPARE RBC (CROSSMATCH)    EKG EKG Interpretation  Date/Time:  Monday July 22 2020 17:09:38 EST Ventricular Rate:  114 PR Interval:  132 QRS Duration: 90 QT Interval:  330 QTC Calculation: 454 R Axis:   66 Text Interpretation: Sinus tachycardia ST depression, consider subendocardial injury Nonspecific T wave abnormality Abnormal ECG since last tracing no significant change Confirmed by Daleen Bo [phone]) on 07/22/2020 5:29:07 PM   Radiology DG Chest 2 View  Result Date: 07/22/2020 CLINICAL DATA:  69 year old male with cough, shortness of breath, congested. COVID-19 status pending. EXAM: CHEST - 2 VIEW COMPARISON:  Chest radiographs 05/15/2014 and earlier. FINDINGS: PA and lateral views. Stable lung volumes and mediastinal contours, within normal limits. Visualized tracheal air column is within normal limits. No pneumothorax, pulmonary edema, pleural effusion or confluent pulmonary opacity. However, there is mildly increased interstitial opacity at both lung bases. No acute osseous abnormality identified. Negative visible bowel gas pattern. IMPRESSION: Mild increased interstitial opacity at both lung bases suspicious for acute viral/atypical respiratory infection in this setting. Electronically Signed   By: Genevie Ann M.D.   On: 07/22/2020 17:55    Procedures .Critical Care Performed by: Lorin Glass, PA-C Authorized by: Lorin Glass, PA-C   Critical care provider statement:    Critical care time (minutes):  45   Critical care time was exclusive of:  Separately billable procedures and treating other patients and teaching time   Critical care was time spent personally by me on the following activities:  Discussions with consultants, evaluation of patient's response to treatment, examination of patient, ordering and performing treatments and interventions, ordering and review of laboratory studies, ordering and review of radiographic studies, pulse oximetry, re-evaluation of patient's condition, obtaining history from patient or surrogate and review of old charts Comments:     Anemia requiring blood, admission.    (including critical care time)  Medications Ordered in ED Medications  nicotine (NICODERM CQ - dosed in mg/24 hours) patch 21 mg (21 mg Transdermal Patch Applied 07/22/20 2050)  aerochamber Z-Stat Plus/medium 1 each (has no administration in time range)  0.9 %  sodium chloride infusion (has no administration in time range)  albuterol (VENTOLIN HFA) 108 (90 Base) MCG/ACT inhaler 2 puff (2 puffs Inhalation Given 07/22/20 2033)  pantoprazole (PROTONIX) injection 40 mg (40 mg Intravenous Given 07/22/20 2050)    ED Course  I have reviewed the triage vital signs and the nursing notes.  Pertinent labs & imaging results that were available during my care of the patient were reviewed by me and considered in my medical decision making (see chart for details).  Clinical Course as of Jul 23 2131  [facility] Jul 22, 2020  1950 I spoke with on call GI Dr. Amedeo Kinsman who recommended clear  fluids, n.p.o. at midnight, starting on Protonix and have them reconsult.   [EH]  1957 POC occult blood, ED(!) [EH]  2104 I spoke with Dr. [name] who will see patient for admission.    [EH]    Clinical Course User Index [EH] Ollen Gross   MDM Rules/Calculators/A&P                         Patient is a 69 year old man  who presents today for evaluation of shortness of breath and low hemoglobin.  He had labs obtained earlier today showing that his hemoglobin was low in the sixes.  He has previously required blood transfusion for anemia from GI bleed.  On exam he is pale, he is tachycardic and tachypneic.  While he does not endorse chest pain there have been notes that he has been having chest pain therefore EKG obtained.  Troponin is slightly elevated, no prior for comparison however given his anemia suspect possible demand related.  CBC is obtained, hemoglobin is low at 6.2.  Patient consented for blood transfusion.  Anemia panel is sent.  2 units PRBC ordered.  Patient's Covid test was negative, his chest x-ray does show a pattern concerning for atypical/viral infection and he is not vaccinated with shortness of breath.  Patient was given albuterol after which his breathing improved.  He is given nicotine patch.  His occult blood is positive.  I spoke with GI Dr. Amedeo Kinsman who is in agreement with transfusion, starting him on Protonix and reconsulting GI in the morning, request he be n.p.o. at midnight and until then can have clears.  Hospitalist is consulted for admission.  Winifred Balogh Llerena was evaluated in Emergency Department on 07/22/2020 for the symptoms described in the history of present illness. He was evaluated in the context of the global COVID-19 pandemic, which necessitated consideration that the patient might be at risk for infection with the SARS-CoV-2 virus that causes COVID-19. Institutional protocols and algorithms that pertain to the evaluation of patients at risk for COVID-19 are in a state of rapid change based on information released by regulatory bodies including the CDC and federal and state organizations. These policies and algorithms were followed during the patient's care in the ED.  Note: Portions of this report may have been transcribed using voice recognition software. Every effort was made to ensure  accuracy; however, inadvertent computerized transcription errors may be present   Final Clinical Impression(s) / ED Diagnoses Final diagnoses:  Gastrointestinal hemorrhage, unspecified gastrointestinal hemorrhage type  Anemia, unspecified type  Shortness of breath    Rx / DC Orders ED Discharge Orders    None       Ollen Gross 07/22/20 2132    Daleen Bo, MD 07/23/20 [phone]

## 2020-07-22 NOTE — ED Triage Notes (Signed)
Pt reports has been having rust colored stools.  Reports had hemoglobin checked today and was told in 6.4  Pt pale, very congested, and c/o chest pain.

## 2020-07-22 NOTE — ED Provider Notes (Signed)
  Face-to-face evaluation   History: He is here for evaluation of rectal bleeding.  He saw his doctor about 10 days ago for same.  Blood was tested at that time.  He does not see bright red blood.  He has been told that he had a "stomach ulcer," in the past.  He had a colonoscopy which was limited because of poor prep.  Physical exam: Obese, alert and cooperative.  He appears comfortable.  Abdomen soft and nontender to palpation.  There is no dysarthria.  He is lucid.  No respiratory distress.  Medical screening examination/treatment/procedure(s) were conducted as a shared visit with non-physician practitioner(s) and myself.  I personally evaluated the patient during the encounter    Daleen Bo, MD 07/23/20 [phone]

## 2020-07-22 NOTE — H&P (Signed)
Clear until MN, then NPO, start PPI  [facility] History & Physical  Kelly Splinter MD  Xiomar Crompton [place] 07/22/2020  Chief Complaint: Low Hb from PCP's office HPI: The patient is a 69 y.o. year-old w/ hx of 85 yr tobacco use/ current, COPD, DM2 on insulin, HTN, obesity, HL sent to ED for low Hb on office labs , Hb 6.4. Has been fatigued w/ SOB/ DOE for about 1 week. No hx COVID exposure, no fevers, chills, n/v/d or abd pain. Has chronic smoker's cough. Has oxygen at home , wears it "some times". In ED Hb was 6.2, creat 1.0, other labs okay. ED have ordered 2u prbc's for transfusion. GI contacted by ED and plan is to start pt on PPI , clear liquids until MN then npo and GI will see in am.  Asked to see for admission.   Pt states has had black stools for last few days.  No bloody stools. Tired when getting aroung, more tired than usual.  Chronic smoker's cough unchanged, no sputum color change or quantity change. No severe SOB.   Pt lives w/ his son, he is divorced.  Says he doesn't follow the diabetes diet at home.   ROS  denies CP  no joint pain   no HA  no blurry vision  no rash  no diarrhea  no nausea/ vomiting  no dysuria  no difficulty voiding  no change in urine color   Past Medical History  Past Medical History:  Diagnosis Date  . Anemia   . Arthritis    left shoulder  . COPD (chronic obstructive pulmonary disease) ([place])   . Diabetes mellitus without complication ([place])   . Dyslipidemia   . Hypertension   . Insomnia   . Insomnia   . Morbid obesity ([place])   . Tobacco use    Past Surgical History  Past Surgical History:  Procedure Laterality Date  . BIOPSY  10/28/2019   Procedure: BIOPSY;  Surgeon: Danie Binder, MD;  Location: [facility];  Service: Endoscopy;;  gastric  . BIOPSY  04/15/2020   Procedure: BIOPSY;  Surgeon: Daneil Dolin, MD;  Location: [facility];  Service: Endoscopy;;  gastric  . COLON RESECTION  1990s?   DIVERTICULITIS  . COLONOSCOPY  WITH PROPOFOL N/A 04/15/2020   Rourk: Poor colon prep. two 6 to 8 mm polyps removed from the hepatic flexure, tubular adenomas.  Repeat colonoscopy later this year.  . ESOPHAGOGASTRODUODENOSCOPY (EGD) WITH PROPOFOL N/A 10/28/2019   Dr. Oneida Alar: Multiple cratered gastric ulcers, gastritis/duodenitis.  Path showed H. pylori.  Patient treated with amoxicillin/Biaxin/PPI twice daily.  . ESOPHAGOGASTRODUODENOSCOPY (EGD) WITH PROPOFOL N/A 04/15/2020   Rourk: Patchy gastric erythema status post biopsy to document eradication of H. pylori.  Biopsy showed persistent H. pylori.  Previously noted gastric ulcer is completely healed.  Marland Kitchen HERNIA REPAIR     UHR  . MALONEY DILATION N/A 04/15/2020   Procedure: Venia Minks DILATION;  Surgeon: Daneil Dolin, MD;  Location: [facility];  Service: Endoscopy;  Laterality: N/A;  . POLYPECTOMY  04/15/2020   Procedure: POLYPECTOMY;  Surgeon: Daneil Dolin, MD;  Location: [facility];  Service: Endoscopy;;  colon  . ROTATOR CUFF REPAIR Bilateral   . TYMPANOPLASTY     Family History  Family History  Problem Relation Age of Onset  . Diabetes Mother   . Heart attack Mother   . Heart attack Father   . Heart attack Brother   . Colon  cancer Neg Hx    Social History  reports that he has been smoking cigarettes. He has been smoking about 2.00 packs per day. He has never used smokeless tobacco. He reports previous alcohol use. He reports that he does not use drugs. Allergies  Allergies  Allergen Reactions  . Caduet [Amlodipine-Atorvastatin] Swelling   Home medications Prior to Admission medications   Medication Sig Start Date End Date Taking? Authorizing Provider  acetaminophen (TYLENOL) 325 MG tablet Take 2 tablets (650 mg total) by mouth every 6 (six) hours as needed for mild pain (or Fever >/= 101). 10/29/19  Yes Emokpae, Courage, MD  albuterol (VENTOLIN HFA) 108 (90 Base) MCG/ACT inhaler Inhale 2 puffs into the lungs every 6 (six) hours as needed for wheezing or  shortness of breath. 10/29/19  Yes Emokpae, Courage, MD  atorvastatin (LIPITOR) 40 MG tablet Take 40 mg by mouth at bedtime. 08/30/19  Yes [provider]  cyanocobalamin (,VITAMIN B-12,) 1000 MCG/ML injection Inject 1,000 mcg into the muscle every 30 (thirty) days.    Yes [provider]  folic acid (FOLVITE) 1 MG tablet Take 1 tablet (1 mg total) by mouth daily. Patient taking differently: Take 1 mg by mouth at bedtime.  10/30/19  Yes Emokpae, Courage, MD  hydrochlorothiazide (HYDRODIURIL) 12.5 MG tablet Take 12.5 mg by mouth at bedtime.  01/23/20  Yes [provider]  insulin NPH-regular Human (NOVOLIN 70/30) (70-30) 100 UNIT/ML injection Inject 50 Units into the skin in the morning and at bedtime. Inject 50 units in morning and 60 units every evening   Yes [provider]  isosorbide mononitrate (IMDUR) 30 MG 24 hr tablet Take 1 tablet (30 mg total) by mouth daily. May take 2 if needed 07/09/20 10/07/20 Yes Kilroy, Lurena Joiner K, PA-C  lisinopril (PRINIVIL,ZESTRIL) 20 MG tablet Take 1 tablet (20 mg total) by mouth daily. 06/09/17  Yes Nida, Marella Chimes, MD  nitroGLYCERIN (NITROSTAT) 0.4 MG SL tablet Place 1 tablet (0.4 mg total) under the tongue every 5 (five) minutes as needed for chest pain. 07/09/20 10/07/20 Yes Kilroy, Luke K, PA-C  pantoprazole (PROTONIX) 40 MG tablet Take 1 tablet (40 mg total) by mouth 2 (two) times daily before a meal. 04/25/20  Yes Mahala Menghini, PA-C  traMADol (ULTRAM) 50 MG tablet Take 50 mg by mouth in the morning and at bedtime.  09/20/19  Yes [provider]  zolpidem (AMBIEN) 10 MG tablet Take 10 mg by mouth at bedtime.    Yes [provider]  Insulin Pen Needle (B-D ULTRAFINE III SHORT PEN) 31G X 8 MM MISC 1 each by Does not apply route as directed. 01/05/17   Cassandria Anger, MD  INSULIN SYRINGE .5CC/29G 29G X 1/2" 0.5 ML MISC Use to inject insulin 2 times a day 02/24/17   Cassandria Anger, MD  ONE TOUCH ULTRA  TEST test strip TEST FOUR TIMES DAILY AS DIRECTED. 05/21/17   Cassandria Anger, MD       Exam Gen older adult male, nasal O2, chronic cough, no distress No rash, cyanosis or gangrene Sclera anicteric, throat clear  No jvd or bruits Chest insp / exp mild wheezing throughout, no rales/ bronchial b RRR no MRG  Abd soft ntnd no mass or ascites +bs GU normal male MS no joint effusions or deformity Ext 1-2+ bilat pretib/ calf edema, no wounds or ulcers Neuro is alert, Ox 3 , nf    Home meds:  - lipitor 40/ hctz 12.5 hs/ imdur  30 qd/ lisinopril 20 qd/ sl ntg prn  - nph+ reg  insulin 70/30 50u am + 60u pm  - protonix 40 qd/ ultram 50 bid/ ambien 10 hs  - albuterol hfa 2 puffs qid prn/ albuterol nebulizer (doesn't use much)  - prn's/ vitamins/ supplements    Na 134  K 4.4  CO2 28  BN 15  Cr 1.08  Ca 9.0  Alb 3.6  lft's okay    WBC 10K  Hb 6.2, MCB 78       fe sat 3%  Fe 13/ tibc 481   Folate 5.4 (low)     Last echo 08/2018 - normal LVEF 55-60%, no sig valve disease       Assessment/ Plan: 1. Anemia - severe, multifactorial.  Appears to be Fe and folate deficient as well as guiac + in ED.  GI consulted, they recommend clears til MN then NPO, start PPI.  2u prbc's ordered per ED.  Hx gastric ulcers in Feb 2021  2. COPD/ long-term tobacco use - uses albuterol hfa prn at home, plan the same here. O2 2L. Has O2 at home as well.  No sig bronchospasm. Chronic cough is unchanged. CXR w/o edema.  3. HTN - cont home meds (acei, hctz), BP's normal to slightly high here.  4. DM2 - will give insulin levemir bid 20u and SSI q 4h, hold home NPH for now 5. Chronic LE edema - was on lasix per pt but gives him cramps so now is just on hctz. Will give 1 dose IV lasix w/ prbc's given chronic LE edema.        Kelly Splinter  MD 07/22/2020, 9:21 PM

## 2020-07-22 NOTE — ED Notes (Signed)
First unit PRBC complete

## 2020-07-22 NOTE — ED Notes (Signed)
Fingerstick blood glucose checked. Patient given a urinal per request.

## 2020-07-22 NOTE — ED Notes (Signed)
Per lab, patient's PRBC 2 units are ready for transfusion.

## 2020-07-22 NOTE — ED Notes (Signed)
Second troponin drawn prior to initiation of PRBC.

## 2020-07-22 NOTE — ED Notes (Signed)
1st unit PRBC transfusion initiated.

## 2020-07-22 NOTE — Progress Notes (Signed)
Per previous note on 11/3, Dr. Juel Burrow office called on Thursday November 4th to follow up on wether or not patient had spoken to the provider so that I could add him to schedule that day for H/H and Type and screen.  Advised that I would need a call back no later than 3 pm so that I could bring him in for labs Thursday and transfuse Friday, provided he met criteria.  Received telephone call from Mechele Claude at Dr. Josue Hector office this am stating that Mr. Granzow was at front desk of hospital to get his type and screen for transfusion.  Advised her that he needed a Clinic appointment for that testing, therefore I contacted patient and made appointment for him to come for H/H and Hold clot.  Upon arrival, patient stated that he had been having chest pain, for which he took 3 nitroglycerin.  In addition, was extremely short of breath.  Blood work was drawn and I discussed his symptoms, advising that he should be seen in the emergency department.  He declined.  I also made him aware that if his hemaglobin was less than 6.5, I would not be able to administer blood in the Clinic setting, per guidelines, to include his symptoms, as well.  Verbalized understanding.  Patient and Juliann Pulse at Dr. Juel Burrow office notified for results. Made him aware that the office would contact him with the next steps in his care.

## 2020-07-23 ENCOUNTER — Encounter (HOSPITAL_COMMUNITY): Payer: Self-pay | Admitting: Nephrology

## 2020-07-23 ENCOUNTER — Inpatient Hospital Stay (HOSPITAL_COMMUNITY): Payer: Medicare Other | Admitting: Anesthesiology

## 2020-07-23 ENCOUNTER — Inpatient Hospital Stay (HOSPITAL_COMMUNITY): Payer: Medicare Other

## 2020-07-23 ENCOUNTER — Encounter (HOSPITAL_COMMUNITY)
Admission: EM | Disposition: A | Discharge status: Home / Self Care | Payer: Self-pay | Source: Home / Self Care | Attending: Internal Medicine

## 2020-07-23 DIAGNOSIS — K5520 Angiodysplasia of colon without hemorrhage: Secondary | ICD-10-CM

## 2020-07-23 DIAGNOSIS — D500 Iron deficiency anemia secondary to blood loss (chronic): Secondary | ICD-10-CM

## 2020-07-23 DIAGNOSIS — K31819 Angiodysplasia of stomach and duodenum without bleeding: Secondary | ICD-10-CM

## 2020-07-23 DIAGNOSIS — K921 Melena: Secondary | ICD-10-CM

## 2020-07-23 HISTORY — PX: ESOPHAGOGASTRODUODENOSCOPY (EGD) WITH PROPOFOL: SHX5813

## 2020-07-23 LAB — CBC
HCT: 29.4 % — ABNORMAL LOW (ref 39.0–52.0)
Hemoglobin: 8.5 g/dL — ABNORMAL LOW (ref 13.0–17.0)
MCH: 23 pg — ABNORMAL LOW (ref 26.0–34.0)
MCHC: 28.9 g/dL — ABNORMAL LOW (ref 30.0–36.0)
MCV: 79.7 fL — ABNORMAL LOW (ref 80.0–100.0)
Platelets: 296 10*3/uL (ref 150–400)
RBC: 3.69 MIL/uL — ABNORMAL LOW (ref 4.22–5.81)
RDW: 19.9 % — ABNORMAL HIGH (ref 11.5–15.5)
WBC: 12 10*3/uL — ABNORMAL HIGH (ref 4.0–10.5)
nRBC: 0 % (ref 0.0–0.2)

## 2020-07-23 LAB — CBG MONITORING, ED
Glucose-Capillary: 179 mg/dL — ABNORMAL HIGH (ref 70–99)
Glucose-Capillary: 124 mg/dL — ABNORMAL HIGH (ref 70–99)

## 2020-07-23 LAB — BASIC METABOLIC PANEL
Anion gap: 11 (ref 5–15)
BUN: 15 mg/dL (ref 8–23)
CO2: 29 mmol/L (ref 22–32)
Calcium: 9.6 mg/dL (ref 8.9–10.3)
Chloride: 97 mmol/L — ABNORMAL LOW (ref 98–111)
Creatinine, Ser: 1.13 mg/dL (ref 0.61–1.24)
GFR, Estimated: >60 mL/min (ref >60)
Glucose, Bld: 152 mg/dL — ABNORMAL HIGH (ref 70–99)
Potassium: 4 mmol/L (ref 3.5–5.1)
Sodium: 137 mmol/L (ref 135–145)

## 2020-07-23 LAB — HEMOGLOBIN AND HEMATOCRIT, BLOOD
HCT: 30.5 % — ABNORMAL LOW (ref 39.0–52.0)
Hemoglobin: 8.7 g/dL — ABNORMAL LOW (ref 13.0–17.0)

## 2020-07-23 LAB — GLUCOSE, CAPILLARY
Glucose-Capillary: 182 mg/dL — ABNORMAL HIGH (ref 70–99)
Glucose-Capillary: 187 mg/dL — ABNORMAL HIGH (ref 70–99)

## 2020-07-23 LAB — HEMOGLOBIN A1C
Hgb A1c MFr Bld: 5.7 % — ABNORMAL HIGH (ref 4.8–5.6)
Mean Plasma Glucose: 116.89 mg/dL

## 2020-07-23 SURGERY — ESOPHAGOGASTRODUODENOSCOPY (EGD) WITH PROPOFOL
Anesthesia: General

## 2020-07-23 MED ORDER — ESMOLOL HCL 100 MG/10ML IV SOLN
INTRAVENOUS | Status: AC
  Filled 2020-07-23: qty 10

## 2020-07-23 MED ORDER — AZITHROMYCIN 250 MG PO TABS
500.0000 mg | ORAL_TABLET | Freq: Every day | ORAL | Status: DC
  Administered 2020-07-23 – 2020-07-24 (×2): 500 mg via ORAL
  Filled 2020-07-23: qty 2
  Filled 2020-07-23: qty 1
  Filled 2020-07-23: qty 2

## 2020-07-23 MED ORDER — SIMETHICONE 40 MG/0.6ML PO SUSP: 40.0000 mg | Freq: Four times a day (QID) | ORAL | Status: DC | PRN

## 2020-07-23 MED ORDER — LIDOCAINE VISCOUS HCL 2 % MT SOLN
OROMUCOSAL | Status: AC
  Filled 2020-07-23: qty 15

## 2020-07-23 MED ORDER — STERILE WATER FOR IRRIGATION IR SOLN
Status: DC | PRN
  Administered 2020-07-23: 100 mL

## 2020-07-23 MED ORDER — IPRATROPIUM-ALBUTEROL 0.5-2.5 (3) MG/3ML IN SOLN
3.0000 mL | Freq: Once | RESPIRATORY_TRACT | Status: AC
  Administered 2020-07-23: 3 mL via RESPIRATORY_TRACT

## 2020-07-23 MED ORDER — GLUCAGON HCL RDNA (DIAGNOSTIC) 1 MG IJ SOLR
INTRAMUSCULAR | Status: AC
  Filled 2020-07-23: qty 1

## 2020-07-23 MED ORDER — FENTANYL CITRATE (PF) 100 MCG/2ML IJ SOLN
50.0000 ug | Freq: Once | INTRAMUSCULAR | Status: AC
  Administered 2020-07-23: 50 ug via INTRAVENOUS

## 2020-07-23 MED ORDER — LIDOCAINE VISCOUS HCL 2 % MT SOLN
15.0000 mL | Freq: Once | OROMUCOSAL | Status: AC
  Administered 2020-07-23: 15 mL via OROMUCOSAL

## 2020-07-23 MED ORDER — FENTANYL CITRATE (PF) 100 MCG/2ML IJ SOLN
25.0000 ug | Freq: Once | INTRAMUSCULAR | Status: AC
  Administered 2020-07-23: 25 ug via INTRAVENOUS
  Filled 2020-07-23: qty 2

## 2020-07-23 MED ORDER — LACTATED RINGERS IV SOLN: Freq: Once | INTRAVENOUS | Status: AC

## 2020-07-23 MED ORDER — METOPROLOL TARTRATE 5 MG/5ML IV SOLN
INTRAVENOUS | Status: AC
  Filled 2020-07-23: qty 5

## 2020-07-23 MED ORDER — FENTANYL CITRATE (PF) 100 MCG/2ML IJ SOLN
INTRAMUSCULAR | Status: AC
  Filled 2020-07-23: qty 2

## 2020-07-23 MED ORDER — MORPHINE SULFATE (PF) 2 MG/ML IV SOLN
0.5000 mg | INTRAVENOUS | Status: DC | PRN
  Administered 2020-07-23 – 2020-07-24 (×5): 1 mg via INTRAVENOUS
  Administered 2020-07-24: 0.5 mg via INTRAVENOUS
  Filled 2020-07-23 (×6): qty 1

## 2020-07-23 MED ORDER — IPRATROPIUM-ALBUTEROL 0.5-2.5 (3) MG/3ML IN SOLN
3.0000 mL | Freq: Four times a day (QID) | RESPIRATORY_TRACT | Status: DC | PRN
  Filled 2020-07-23: qty 3

## 2020-07-23 MED ORDER — ESMOLOL HCL 100 MG/10ML IV SOLN
INTRAVENOUS | Status: DC | PRN
  Administered 2020-07-23: 30 mg via INTRAVENOUS
  Administered 2020-07-23: 50 mg via INTRAVENOUS
  Administered 2020-07-23: 20 mg via INTRAVENOUS

## 2020-07-23 MED ORDER — IOHEXOL 300 MG/ML  SOLN
150.0000 mL | Freq: Once | INTRAMUSCULAR | Status: AC | PRN
  Administered 2020-07-23: 125 mL via INTRAVENOUS

## 2020-07-23 MED ORDER — LACTATED RINGERS IV SOLN: INTRAVENOUS | Status: DC | PRN

## 2020-07-23 MED ORDER — PROPOFOL 10 MG/ML IV BOLUS
INTRAVENOUS | Status: DC | PRN
  Administered 2020-07-23 (×2): 20 mg via INTRAVENOUS

## 2020-07-23 MED ORDER — METOPROLOL TARTRATE 5 MG/5ML IV SOLN
INTRAVENOUS | Status: DC | PRN
  Administered 2020-07-23: 3 mg via INTRAVENOUS
  Administered 2020-07-23: 2 mg via INTRAVENOUS

## 2020-07-23 MED ORDER — PROPOFOL 500 MG/50ML IV EMUL
INTRAVENOUS | Status: DC | PRN
  Administered 2020-07-23: 100 ug/kg/min via INTRAVENOUS

## 2020-07-23 MED ORDER — SODIUM CHLORIDE 0.9 % IV SOLN: INTRAVENOUS | Status: DC

## 2020-07-23 NOTE — ED Notes (Signed)
Transfusion complete of 2nd unit PRBC.  Patient tolerated well.  0 s/s reaction.

## 2020-07-23 NOTE — Consult Note (Signed)
@LOGO @   Referring Provider: [facility] hospital[facility] Primary Care Physician:  Celene Squibb, MD Primary Gastroenterologist:  Dr. Gala Romney  Date of Admission: 07/22/2020 Date of Consultation: 07/23/2020  Reason for Consultation:  GI bleed, anemia  HPI:  Gregory Alvarado is a 69 y.o. year old male with history of COPD, diabetes, chronic renal insufficiency, HTN, HLD, chronic diastolic heart failure, IDA, GI bleed secondary to PUD with multiple cratered gastric ulcers noted on EGD in February 2021, H. pylori also noted at that time s/p treatment with PPI BID, amoxicillin, and Biaxin repeat EGD in August 2021 with previous gastric ulcers completely healed but with persistent H. Pylori which was treated a second time with Pepto-Bismol/tetracycline/metronidazole/ PPI BID with no documentation of eradication, incomplete colonoscopy in August 2021 with 2 adenomatous colon polyps with recommendations repeat colonoscopy later this year.  Patient presented to the emergency room 07/22/2020 due to having " rust colored stools", having his hemoglobin checked earlier that day by PCP and was told it was 6.4.  He also reported chest pain and shortness of breath with minimal exertion.  Of note, he has recently been evaluated by cardiology on 07/09/2020 due to exertional angina.  Stated he was not a candidate for nuclear stress secondary to wheezing, not a candidate for CT secondary to weight, poor candidate for cath secondary to CRI, inability to lie flat, and PUD may prohibit DPAT therapy.  He was started on indoor 30 mg.  Stated if symptoms progress, would need to consider cath.  ED course: Patient was pale, tachycardic, and tachypneic.  Mildly hypertensive, afebrile.  Hemoglobin 6.2 (L) with microcytic indices, ferritin 4 (L), iron 13 (L), saturation 3% (L), folate 5.4 (L), B12 201, sodium 134 (L), potassium 4.4, BUN 15, creatinine 1.08, LFTs within normal limits.  BNP elevated at 165.  First troponin 41, repeat troponin 50.  EKG  with nonspecific ST and T wave abnormality, prolonged QT.  Chest x-ray with mild increase interstitial opacity at both lung bases suspicious for acute viral/atypical respiratory infection in the setting.  Respiratory panel negative.  Patient was given albuterol and breathing improved.  Patient was started on IV PPI twice daily and transfused 2 units PRBCs with hemoglobin up to 8.5 this morning.    Today:  Came to the emergency room due to hemoglobin being 6.4. Has been having dark red/rust colored stools. Water also turns red. Also on toilet tissue. Reports stools are sticky. No black stools. Has been taking Protonix BID. No GERD. No dysphagia. No NSAIDs other than baby aspirin. BM every 3 days. Constipation has been present at least since February. Hasn't been taking iron.   Upper abdominal pain just started about 1 hour ago. Nausea started last night. No vomiting but has been dry heaving. States when he lays down, he feels phlegm is coming back up and he has to cough it up. Sleeps in a recliner due to worsening shortness of breath when laying down. Doesn't take diuretics at home. Lasix gives him cramps.   Having a lot of congestion for a while. Present for weeks. Coughing up clear phlegm. No fever or chills. Has been having shortness of breath with exertion which has been somewhat worse lately. When he moves around, he has centralized chest pain. Isosorbide has helped the chest pain somewhat. Has also been using nitroglycerine intermittently. Last used yesterday. Nitro helps chest pain. Occasionally with chest pain when sitting still.   No lightheadedness, pre-syncope or syncope. Lost his balance this morning and almost  fell. No weakness.   Rare alcohol use. No history of drug use. 2 to 2.5 pack of cigarette daily.   Patient reports having a pack of graham crackers this morning and some water around 4-5 AM.   Past Medical History:  Diagnosis Date  . Anemia   . Arthritis    left shoulder  .  COPD (chronic obstructive pulmonary disease) ([place])   . Diabetes mellitus without complication ([place])   . Dyslipidemia   . Hypertension   . Insomnia   . Insomnia   . Morbid obesity ([place])   . Tobacco use     Past Surgical History:  Procedure Laterality Date  . BIOPSY  10/28/2019   Procedure: BIOPSY;  Surgeon: Danie Binder, MD;  [facility];  Service: Endoscopy;;  gastric  . BIOPSY  04/15/2020   Procedure: BIOPSY;  Surgeon: Daneil Dolin, MD;  Location: [facility];  Service: Endoscopy;;  gastric  . COLON RESECTION  1990s?   DIVERTICULITIS  . COLONOSCOPY WITH PROPOFOL N/A 04/15/2020   Rourk: Poor colon prep. two 6 to 8 mm polyps removed from the hepatic flexure, tubular adenomas.  Repeat colonoscopy later this year.  . ESOPHAGOGASTRODUODENOSCOPY (EGD) WITH PROPOFOL N/A 10/28/2019   Dr. Oneida Alar: Multiple cratered gastric ulcers, gastritis/duodenitis.  Path showed H. pylori.  Patient treated with amoxicillin/Biaxin/PPI twice daily.  . ESOPHAGOGASTRODUODENOSCOPY (EGD) WITH PROPOFOL N/A 04/15/2020   Rourk: Patchy gastric erythema status post biopsy to document eradication of H. pylori.  Biopsy showed persistent H. pylori.  Previously noted gastric ulcer is completely healed.  Marland Kitchen HERNIA REPAIR     UHR  . MALONEY DILATION N/A 04/15/2020   Procedure: Venia Minks DILATION;  Surgeon: Daneil Dolin, MD;  Location: [facility];  Service: Endoscopy;  Laterality: N/A;  . POLYPECTOMY  04/15/2020   Procedure: POLYPECTOMY;  Surgeon: Daneil Dolin, MD;  Location: [facility];  Service: Endoscopy;;  colon  . ROTATOR CUFF REPAIR Bilateral   . TYMPANOPLASTY      Prior to Admission medications   Medication Sig Start Date End Date Taking? Authorizing Provider  acetaminophen (TYLENOL) 325 MG tablet Take 2 tablets (650 mg total) by mouth every 6 (six) hours as needed for mild pain (or Fever >/= 101). 10/29/19  Yes Emokpae, Courage, MD  albuterol (VENTOLIN HFA) 108 (90 Base) MCG/ACT inhaler Inhale 2  puffs into the lungs every 6 (six) hours as needed for wheezing or shortness of breath. 10/29/19  Yes Emokpae, Courage, MD  atorvastatin (LIPITOR) 40 MG tablet Take 40 mg by mouth at bedtime. 08/30/19  Yes [provider]  cyanocobalamin (,VITAMIN B-12,) 1000 MCG/ML injection Inject 1,000 mcg into the muscle every 30 (thirty) days.    Yes [provider]  folic acid (FOLVITE) 1 MG tablet Take 1 tablet (1 mg total) by mouth daily. Patient taking differently: Take 1 mg by mouth at bedtime.  10/30/19  Yes Emokpae, Courage, MD  hydrochlorothiazide (HYDRODIURIL) 12.5 MG tablet Take 12.5 mg by mouth at bedtime.  01/23/20  Yes [provider]  insulin NPH-regular Human (NOVOLIN 70/30) (70-30) 100 UNIT/ML injection Inject 50 Units into the skin in the morning and at bedtime. Inject 50 units in morning and 60 units every evening   Yes [provider]  isosorbide mononitrate (IMDUR) 30 MG 24 hr tablet Take 1 tablet (30 mg total) by mouth daily. May take 2 if needed 07/09/20 10/07/20 Yes Kilroy, Lurena Joiner K, PA-C  lisinopril (PRINIVIL,ZESTRIL) 20 MG tablet Take  1 tablet (20 mg total) by mouth daily. 06/09/17  Yes Nida, Marella Chimes, MD  nitroGLYCERIN (NITROSTAT) 0.4 MG SL tablet Place 1 tablet (0.4 mg total) under the tongue every 5 (five) minutes as needed for chest pain. 07/09/20 10/07/20 Yes Kilroy, Luke K, PA-C  pantoprazole (PROTONIX) 40 MG tablet Take 1 tablet (40 mg total) by mouth 2 (two) times daily before a meal. 04/25/20  Yes Mahala Menghini, PA-C  traMADol (ULTRAM) 50 MG tablet Take 50 mg by mouth in the morning and at bedtime.  09/20/19  Yes [provider]  zolpidem (AMBIEN) 10 MG tablet Take 10 mg by mouth at bedtime.    Yes [provider]  Insulin Pen Needle (B-D ULTRAFINE III SHORT PEN) 31G X 8 MM MISC 1 each by Does not apply route as directed. 01/05/17   Cassandria Anger, MD  INSULIN SYRINGE .5CC/29G 29G X 1/2" 0.5 ML MISC Use to inject insulin 2  times a day 02/24/17   Cassandria Anger, MD  ONE TOUCH ULTRA TEST test strip TEST FOUR TIMES DAILY AS DIRECTED. 05/21/17   Cassandria Anger, MD    Current Facility-Administered Medications  Medication Dose Route Frequency Provider Last Rate Last Admin  . 0.9 %  sodium chloride infusion  250 mL Intravenous PRN Roney Jaffe, MD      . aerochamber Z-Stat Plus/medium 1 each  1 each Other Once Lorin Glass, PA-C      . albuterol (VENTOLIN HFA) 108 (90 Base) MCG/ACT inhaler 2 puff  2 puff Inhalation TID Roney Jaffe, MD      . atorvastatin (LIPITOR) tablet 40 mg  40 mg Oral QHS Roney Jaffe, MD   40 mg at 07/22/20 2347  . folic acid (FOLVITE) tablet 1 mg  1 mg Oral QHS Roney Jaffe, MD   1 mg at 07/22/20 2347  . hydrochlorothiazide (HYDRODIURIL) tablet 12.5 mg  12.5 mg Oral QHS Roney Jaffe, MD   12.5 mg at 07/22/20 2338  . insulin aspart (novoLOG) injection 0-20 Units  0-20 Units Subcutaneous Q4H Roney Jaffe, MD   4 Units at 07/23/20 0507  . insulin detemir (LEVEMIR) injection 15 Units  15 Units Subcutaneous BID Roney Jaffe, MD   15 Units at 07/22/20 2342  . isosorbide mononitrate (IMDUR) 24 hr tablet 30 mg  30 mg Oral Daily Roney Jaffe, MD      . lisinopril (ZESTRIL) tablet 20 mg  20 mg Oral Daily Roney Jaffe, MD   20 mg at 07/22/20 2339  . nicotine (NICODERM CQ - dosed in mg/24 hours) patch 21 mg  21 mg Transdermal Once Lorin Glass, PA-C   21 mg at 07/22/20 2050  . nicotine (NICODERM CQ - dosed in mg/24 hours) patch 21 mg  21 mg Transdermal Daily Roney Jaffe, MD      . ondansetron [facility]) tablet 4 mg  4 mg Oral Q6H PRN Roney Jaffe, MD       Or  . ondansetron (ZOFRAN) injection 4 mg  4 mg Intravenous Q6H PRN Roney Jaffe, MD      . pantoprazole (PROTONIX) injection 40 mg  40 mg Intravenous Q12H Roney Jaffe, MD      . polyethylene glycol (MIRALAX / GLYCOLAX) packet 17 g  17 g Oral Daily PRN Roney Jaffe, MD      . sodium  chloride flush (NS) 0.9 % injection 3 mL  3 mL Intravenous Q12H Roney Jaffe, MD      . sodium chloride flush (  NS) 0.9 % injection 3 mL  3 mL Intravenous PRN Roney Jaffe, MD      . traMADol Veatrice Bourbon) tablet 50 mg  50 mg Oral Q12H PRN Roney Jaffe, MD      . zolpidem Lorrin Mais) tablet 5 mg  5 mg Oral QHS PRN Roney Jaffe, MD   5 mg at 07/22/20 2338   Current Outpatient Medications  Medication Sig Dispense Refill  . acetaminophen (TYLENOL) 325 MG tablet Take 2 tablets (650 mg total) by mouth every 6 (six) hours as needed for mild pain (or Fever >/= 101). 12 tablet 0  . albuterol (VENTOLIN HFA) 108 (90 Base) MCG/ACT inhaler Inhale 2 puffs into the lungs every 6 (six) hours as needed for wheezing or shortness of breath. 18 g 0  . atorvastatin (LIPITOR) 40 MG tablet Take 40 mg by mouth at bedtime.    . cyanocobalamin (,VITAMIN B-12,) 1000 MCG/ML injection Inject 1,000 mcg into the muscle every 30 (thirty) days.     . folic acid (FOLVITE) 1 MG tablet Take 1 tablet (1 mg total) by mouth daily. (Patient taking differently: Take 1 mg by mouth at bedtime. ) 30 tablet 2  . hydrochlorothiazide (HYDRODIURIL) 12.5 MG tablet Take 12.5 mg by mouth at bedtime.     . insulin NPH-regular Human (NOVOLIN 70/30) (70-30) 100 UNIT/ML injection Inject 50 Units into the skin in the morning and at bedtime. Inject 50 units in morning and 60 units every evening    . isosorbide mononitrate (IMDUR) 30 MG 24 hr tablet Take 1 tablet (30 mg total) by mouth daily. May take 2 if needed 180 tablet 3  . lisinopril (PRINIVIL,ZESTRIL) 20 MG tablet Take 1 tablet (20 mg total) by mouth daily. 30 tablet 6  . nitroGLYCERIN (NITROSTAT) 0.4 MG SL tablet Place 1 tablet (0.4 mg total) under the tongue every 5 (five) minutes as needed for chest pain. 25 tablet 3  . pantoprazole (PROTONIX) 40 MG tablet Take 1 tablet (40 mg total) by mouth 2 (two) times daily before a meal. 60 tablet 5  . traMADol (ULTRAM) 50 MG tablet Take 50 mg by mouth  in the morning and at bedtime.     Marland Kitchen zolpidem (AMBIEN) 10 MG tablet Take 10 mg by mouth at bedtime.     . Insulin Pen Needle (B-D ULTRAFINE III SHORT PEN) 31G X 8 MM MISC 1 each by Does not apply route as directed. 100 each 3  . INSULIN SYRINGE .5CC/29G 29G X 1/2" 0.5 ML MISC Use to inject insulin 2 times a day 100 each 3  . ONE TOUCH ULTRA TEST test strip TEST FOUR TIMES DAILY AS DIRECTED. 150 each 5    Allergies as of 07/22/2020 - Review Complete 07/22/2020  Allergen Reaction Noted  . Caduet [amlodipine-atorvastatin] Swelling 09/18/2015    Family History  Problem Relation Age of Onset  . Diabetes Mother   . Heart attack Mother   . Heart attack Father   . Heart attack Brother   . Colon cancer Neg Hx     Social History   Socioeconomic History  . Marital status: Divorced    Spouse name: Not on file  . Number of children: Not on file  . Years of education: Not on file  . Highest education level: Not on file  Occupational History  . Not on file  Tobacco Use  . Smoking status: Current Every Day Smoker    Packs/day: 2.00    Types: Cigarettes  . Smokeless tobacco: Never  Used  Vaping Use  . Vaping Use: Never used  Substance and Sexual Activity  . Alcohol use: Not Currently    Comment: rare  . Drug use: No  . Sexual activity: Not on file  Other Topics Concern  . Not on file  Social History Narrative  . Not on file   Social Determinants of Health   Financial Resource Strain:   . Difficulty of Paying Living Expenses: Not on file  Food Insecurity:   . Worried About Charity fundraiser in the Last Year: Not on file  . Ran Out of Food in the Last Year: Not on file  Transportation Needs:   . Lack of Transportation (Medical): Not on file  . Lack of Transportation (Non-Medical): Not on file  Physical Activity:   . Days of Exercise per Week: Not on file  . Minutes of Exercise per Session: Not on file  Stress:   . Feeling of Stress : Not on file  Social Connections:   .  Frequency of Communication with Friends and Family: Not on file  . Frequency of Social Gatherings with Friends and Family: Not on file  . Attends Religious Services: Not on file  . Active Member of Clubs or Organizations: Not on file  . Attends Archivist Meetings: Not on file  . Marital Status: Not on file  Intimate Partner Violence:   . Fear of Current or Ex-Partner: Not on file  . Emotionally Abused: Not on file  . Physically Abused: Not on file  . Sexually Abused: Not on file    Review of Systems: Gen: Denies fever, chills.  CV: See HPI Resp: See HPI GI: See HPI.  MS: Chronic back pain.  Heme: See HPI  Physical Exam: Vital signs in last 24 hours: Temp:  [98.4 F (36.9 C)-98.6 F (37 C)] 98.5 F (36.9 C) (11/09 0305) Pulse Rate:  [96-131] 105 (11/09 0608) Resp:  [11-36] 26 (11/09 0608) BP: (99-173)/(56-105) 146/72 (11/09 0608) SpO2:  [85 %-100 %] 94 % (11/09 2876)   General:   Alert,  Well-developed, well-nourished, pleasant and cooperative in NAD but notably some increased work of breathing when moving in bed. Somewhat pale.  Head:  Normocephalic and atraumatic. Eyes:  Sclera clear, no icterus.   Conjunctiva pink. Ears:  Normal auditory acuity. Lungs:  Rhonchi throughout lower lung fields.  Heart:  Regular rate and rhythm; no murmurs, clicks, rubs,  or gallops. Abdomen:  Obese, soft, mild TTP in epigastric area. No masses, hepatosplenomegaly or hernias noted. Normal bowel sounds, without guarding, and without rebound.   Rectal:  Deferred  Msk:  Symmetrical without gross deformities. Normal posture. Extremities:  With 1+ bilateral LE edema. Neurologic:  Alert and  oriented x4;  grossly normal neurologically. Skin:  Intact without significant lesions or rashes. Psych: Normal mood and affect.  Intake/Output from previous day: 11/08 0701 - 11/09 0700 In: 1070 [I.V.:25; Blood:1045] Out: 575 [Urine:575] Intake/Output this shift: No intake/output data  recorded.  Lab Results: Recent Labs    07/22/20 1253 07/22/20 1938 07/23/20 0600  WBC  --  10.3 12.0*  HGB 6.4* 6.2* 8.5*  HCT 23.2* 22.6* 29.4*  PLT  --  264 296   BMET Recent Labs    07/22/20 1938 07/23/20 0600  NA 134* 137  K 4.4 4.0  CL 98 97*  CO2 28 29  GLUCOSE 170* 152*  BUN 15 15  CREATININE 1.08 1.13  CALCIUM 9.0 9.6   LFT Recent Labs  07/22/20 1938  PROT 6.8  ALBUMIN 3.6  AST 10*  ALT 10  ALKPHOS 62  BILITOT 0.4   Studies/Results: DG Chest 2 View  Result Date: 07/22/2020 CLINICAL DATA:  69 year old male with cough, shortness of breath, congested. COVID-19 status pending. EXAM: CHEST - 2 VIEW COMPARISON:  Chest radiographs 05/15/2014 and earlier. FINDINGS: PA and lateral views. Stable lung volumes and mediastinal contours, within normal limits. Visualized tracheal air column is within normal limits. No pneumothorax, pulmonary edema, pleural effusion or confluent pulmonary opacity. However, there is mildly increased interstitial opacity at both lung bases. No acute osseous abnormality identified. Negative visible bowel gas pattern. IMPRESSION: Mild increased interstitial opacity at both lung bases suspicious for acute viral/atypical respiratory infection in this setting. Electronically Signed   By: Genevie Ann M.D.   On: 07/22/2020 17:55    Impression: 69 year old male with history ofCOPD, diabetes, chronic renal insufficiency, HTN, HLD, chronic diastolic heart failure, IDA, GI bleed secondary to PUD with multiple cratered gastric ulcers noted on EGD in February 2021, H. pylori also noted at that time s/p treatment with PPI BID, amoxicillin, and Biaxin repeat EGD in August 2021 with previous gastric ulcers completely healed but with persistent H. Pylori which was treated a second time with Pepto-Bismol/tetracycline/metronidazole/ PPI BID with no documentation of eradication, incomplete colonoscopy in August 2021 with 2 adenomatous colon polyps with recommendations  repeat colonoscopy later this year who presented to the emergency room due to low hemoglobin and ongoing "rust colored stools". Also reported few week history of shortness of breath and intermittent chest pain.  He was found have hemoglobin 6.2 with microcytic indices, ferritin 4, occult blood positive.  Folate low at 5.4 as well.  BUN within normal limits. BNP elevated at 165.  First troponin 41, repeat troponin 50.  EKG with nonspecific ST and T wave abnormality, prolonged QT.  Chest x-ray with mild increase interstitial opacity at both lung bases suspicious for acute viral/atypical respiratory infection in the setting.  Respiratory panel negative.  Patient was given albuterol and breathing improved.  Respiratory panel negative.  Anemia with rectal bleeding: Several months of ongoing "rust colored" bowel movements.  Hemoglobin 6.2 on admission s/p 2 units PRBCs with hemoglobin 8.5 this morning. Denies NSAIDs other than baby aspirin.  He has been on Protonix twice daily for several months now.  No significant GERD symptoms.  Had not been having any upper abdominal pain until the last hour which she thinks may be secondary to being hungry.  Also with mild nausea and dry heaving but no vomiting.  Differentials include small bowel AVMs, H. pylori gastritis, intermittent diverticular bleed, or possible right-sided colonic lesion as prior colonoscopy in August was incomplete.  Discussed case with Dr. Jenetta Downer.  Recommended EGD with push enteroscopy today.  May need to consider colonoscopy.  Notably, he is a marginal candidate for procedure considering shortness of breath, congestion, and intermittent chest pain. Discussed with Dr. Jenetta Downer who will discuss this with anesthesiology to determine appropriateness of moving forward with procedure.  The symptoms could be secondary to anemia, but cannot rule out other etiology. May need further evaluation cardiology.   Plan: NPO Plan for EGD with push enteroscopy  with propofol today with Dr. Jenetta Downer. The risks, benefits, and alternatives have been discussed with the patient in detail. The patient states understanding and desires to proceed.  Anesthesia to review chart to determine appropriateness of procedure in light of SOB and intermittent chest discomfort.  Continue IV PPI BID Further recommendations  to follow procedures.  He will need iron infusion or oral iron as an outpatient.      LOS: 1 day    07/23/2020, 7:29 AM   Aliene Altes, PA-C [facility]

## 2020-07-23 NOTE — Op Note (Signed)
[facility] Patient Name: Gregory Alvarado Procedure Date: 07/23/2020 12:43 PM MRN: [id number] Date of Birth: [birth year] Attending MD: Maylon Peppers ,  CSN: [id number] Age: 69 Admit Type: Outpatient Procedure:                Small bowel enteroscopy Indications:              Melena Providers:                Maylon Peppers, Lambert Mody, Aram Candela Referring MD:              Medicines:                Monitored Anesthesia Care Complications:            No immediate complications. Estimated Blood Loss:     Estimated blood loss: none. Procedure:                Pre-Anesthesia Assessment:                           - Prior to the procedure, a History and Physical                            was performed, and patient medications, allergies                            and sensitivities were reviewed. The patient's                            tolerance of previous anesthesia was reviewed.                           - The risks and benefits of the procedure and the                            sedation options and risks were discussed with the                            patient. All questions were answered and informed                            consent was obtained.                           - ASA Grade Assessment: III - A patient with severe                            systemic disease.                           After obtaining informed consent, the endoscope was                            passed under direct vision. Throughout the                            procedure, the patient's blood pressure, pulse, and  oxygen saturations were monitored continuously. The                            [id number]-[id number] ([id number]) scope was introduced through                            the mouth, and advanced to the proximal jejunum.                            After obtaining informed consent, the endoscope was                            passed under direct vision. Throughout the                             procedure, the patient's blood pressure, pulse, and                            oxygen saturations were monitored continuously.The                            small bowel enteroscopy was accomplished without                            difficulty. The patient tolerated the procedure                            well. Scope In: 2:52:01 PM Scope Out: 3:23:41 PM Total Procedure Duration: 0 hours 31 minutes 40 seconds  Findings:      The esophagus was normal.      The stomach was normal.      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 Coagulation for bleeding prevention using argon plasma at 0.3       liters/minute and 20 watts was successful.      Three angiodysplastic lesions with no bleeding were found in the       proximal jejunum. Coagulation for bleeding prevention using argon plasma       at 0.3 liters/minute and 20 watts was successful. Impression:               - Normal esophagus.                           - Normal stomach.                           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                            Treated with argon plasma coagulation (APC).                           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 Treated with argon plasma coagulation                            (APC).                           - No specimens collected. Moderate Sedation:      Per Anesthesia Care Recommendation:           - Return patient to hospital [facility] for ongoing care.                           - Clear liquid diet.                           - If patient rebleeds clinically or has further                            drop in hemoglobin, may consider performing capsule                            endoscopy in-house.                           - May consider octreotide SQ if recurrent drop in                            hemoglobin. Procedure Code(s):        ---  Professional ---                           [id number], GC, Small intestinal endoscopy, enteroscopy                            beyond second portion of duodenum, not including                            ileum; with control of bleeding (eg, injection,                            bipolar cautery, unipolar cautery, laser, heater                            probe, stapler, plasma coagulator) Diagnosis Code(s):        --- Professional ---                           X32.355, Angiodysplasia of stomach and duodenum                            without bleeding                           K55.20, Angiodysplasia of colon without hemorrhage                           K92.1, Melena (includes Hematochezia) CPT copyright 2019 American  Medical Association. All rights reserved. The codes documented in this report are preliminary and upon coder review may  be revised to meet current compliance requirements. Maylon Peppers, MD Maylon Peppers,  07/23/2020 3:30:37 PM This report has been signed electronically. Number of Addenda: 0

## 2020-07-23 NOTE — Progress Notes (Signed)
PROGRESS NOTE    Gregory Alvarado  TML:[id number] DOB: 05/25/1951 DOA: 07/22/2020 PCP: Celene Squibb, MD    Chief Complaint  Patient presents with  . Shortness of Breath  . low hemoglobin    Brief Narrative:  69 year old gentleman with known prior history of tobacco abuse, COPD, diabetes, hypertension, obesity, hyperlipidemia sent to ED from office with a low hemoglobin.  In addition patient patient reports exertional chest pain. GI and gastroenterology consulted.  Patient is scheduled to undergo EGD later today.  Assessment & Plan:   Principal Problem:   Symptomatic anemia Active Problems:   Essential hypertension   Insulin dependent type 2 diabetes mellitus (HCC)   COPD (chronic obstructive pulmonary disease) (HCC)   Current smoker   Iron deficiency anemia   Melena   GI bleed   Chronic diastolic CHF (congestive heart failure) (HCC)   Guaiac positive stools   Folate deficiency   Acute anemia of blood loss/GI bleed in the setting of prior history of gastric ulcers, GERD, tubular adenoma, recent H pylori infection. .  Stool for occult blood is positive.  S/p 1 unit of PRBC transfusion, repeat hemoglobin is around 8.5.  Continue with IV PPI BID. GI on board and plan for EGD today.     Nausea, abdominal pain:  Pt's abd appears distended, reports feeling bloated as he didn't have anything to eat.  If his pain is persistent will get CT abd and pelvis for further evaluation.  Liver enzymes wnl.    Exertional chest pain Minimally elevated troponins and EKG shows ST and T wave abnormalities in the lateral leads and elevated BNP.  Echocardiogram ordered for further evaluation. Heart score is 7, has h/o hypertension, DM and smoker.  Cardiology consulted, who will see the patient in the morning. Repeat EKG in am.    Iron deficiency/ microcytic anemia and folate deficiency:  Plan for IV iron infusion tomorrow as his iron stores are low. , replace folic acid.   Anemia panel  shows low ferritin, iron and low folate levels.    Leukocytosis: CXR shows  Mild increased interstitial opacity at both lung bases suspicious for acute viral/atypical respiratory infection. He is a smoker and has a cough.  Will start him on oral azithromycin for 3 days for bronchitis.     COPD/ current smoker:  No wheezing heard, resume duonebs.  Keep sats greater than 90 %   Essential Hypertension:  Well controlled. Resume home meds.     Type 2 DM:  CBG (last 3)  Recent Labs    07/22/20 2251 07/23/20 0457 07/23/20 0842  GLUCAP 163* 179* 124*   Resume SSI. Well controlled CBG'S. Resume Levemir.  Last A1c is 5.7%   Active smoker on nicotine patch.       DVT prophylaxis: scd's Code Status: (Full code) Family Communication: None at bedside.  Disposition:   Status is: Inpatient  Remains inpatient appropriate because:Ongoing diagnostic testing needed not appropriate for outpatient work up, IV treatments appropriate due to intensity of illness or inability to take PO and Inpatient level of care appropriate due to severity of illness   Dispo: The patient is from: Home              Anticipated d/c is to: Home              Anticipated d/c date is: 2 days              Patient currently is not medically stable to d/c.  Consultants:   Cardiology  GI  Procedures: egd ECHOCARDIOGRAM.    Antimicrobials: Azithromycin.     Subjective: Pt reports feeling bloated, requesting something for gas,.   Objective: Vitals:   07/23/20 1300 07/23/20 1315 07/23/20 1330 07/23/20 1403  BP: 139/78 (!) 142/82 138/83 (!) 144/69  Pulse: 95 96 92 91  Resp: 19 (!) 22 13   Temp:    98.8 F (37.1 C)  TempSrc:    Oral  SpO2: 97% 96% 99% 99%    Intake/Output Summary (Last 24 hours) at 07/23/2020 1418 Last data filed at 07/23/2020 0514 Gross per 24 hour  Intake 1070 ml  Output 575 ml  Net 495 ml   There were no vitals filed for this  visit.  Examination:  General exam: appears to be in mild discomfort   Respiratory system: Clear to auscultation. Respiratory effort normal. Cardiovascular system: S1 & S2 heard, RRR. No JVD, pedal edema present.  Gastrointestinal system: Abdomen is soft, distended, mild discomfort on palpation.  bowel sounds heard.  Central nervous system: Alert and oriented. No focal neurological deficits. Extremities: Symmetric 5 x 5 power. Skin: No rashes, lesions or ulcers Psychiatry:  Mood & affect appropriate.     Data Reviewed: I have personally reviewed following labs and imaging studies  CBC: Recent Labs  Lab 07/22/20 1253 07/22/20 1938 07/23/20 0600  WBC  --  10.3 12.0*  NEUTROABS  --  8.2*  --   HGB 6.4* 6.2* 8.5*  HCT 23.2* 22.6* 29.4*  MCV  --  78.7* 79.7*  PLT  --  264 867    Basic Metabolic Panel: Recent Labs  Lab 07/22/20 1938 07/23/20 0600  NA 134* 137  K 4.4 4.0  CL 98 97*  CO2 28 29  GLUCOSE 170* 152*  BUN 15 15  CREATININE 1.08 1.13  CALCIUM 9.0 9.6    GFR: Estimated Creatinine Clearance: 89.6 mL/min (by C-G formula based on SCr of 1.13 mg/dL).  Liver Function Tests: Recent Labs  Lab 07/22/20 1938  AST 10*  ALT 10  ALKPHOS 62  BILITOT 0.4  PROT 6.8  ALBUMIN 3.6    CBG: Recent Labs  Lab 07/22/20 2251 07/23/20 0457 07/23/20 0842  GLUCAP 163* 179* 124*     Recent Results (from the past 240 hour(s))  Resp Panel by RT PCR (RSV, Flu A&B, Covid) - Nasopharyngeal Swab     Status: None   Collection Time: 07/22/20  5:12 PM   Specimen: Nasopharyngeal Swab  Result Value Ref Range Status   SARS Coronavirus 2 by RT PCR NEGATIVE NEGATIVE Final    Comment: (NOTE) SARS-CoV-2 target nucleic acids are NOT DETECTED.  The SARS-CoV-2 RNA is generally detectable in upper respiratoy specimens during the acute phase of infection. The lowest concentration of SARS-CoV-2 viral copies this assay can detect is 131 copies/mL. A negative result does not preclude  SARS-Cov-2 infection and should not be used as the sole basis for treatment or other patient management decisions. A negative result may occur with  improper specimen collection/handling, submission of specimen other than nasopharyngeal swab, presence of viral mutation(s) within the areas targeted by this assay, and inadequate number of viral copies (<131 copies/mL). A negative result must be combined with clinical observations, patient history, and epidemiological information. The expected result is Negative.  Fact Sheet for Patients:  PinkCheek.be  Fact Sheet for Healthcare Providers:  GravelBags.it  This test is no t yet approved or cleared by the [place] FDA and  has been  authorized for detection and/or diagnosis of SARS-CoV-2 by FDA under an Emergency Use Authorization (EUA). This EUA will remain  in effect (meaning this test can be used) for the duration of the COVID-19 declaration under Section 564(b)(1) of the Act, 21 U.S.C. section 360bbb-3(b)(1), unless the authorization is terminated or revoked sooner.     Influenza A by PCR NEGATIVE NEGATIVE Final   Influenza B by PCR NEGATIVE NEGATIVE Final    Comment: (NOTE) The Xpert Xpress SARS-CoV-2/FLU/RSV assay is intended as an aid in  the diagnosis of influenza from Nasopharyngeal swab specimens and  should not be used as a sole basis for treatment. Nasal washings and  aspirates are unacceptable for Xpert Xpress SARS-CoV-2/FLU/RSV  testing.  Fact Sheet for Patients: PinkCheek.be  Fact Sheet for Healthcare Providers: GravelBags.it  This test is not yet approved or cleared by the [place] FDA and  has been authorized for detection and/or diagnosis of SARS-CoV-2 by  FDA under an Emergency Use Authorization (EUA). This EUA will remain  in effect (meaning this test can be used) for the duration of the   Covid-19 declaration under Section 564(b)(1) of the Act, 21  U.S.C. section 360bbb-3(b)(1), unless the authorization is  terminated or revoked.    Respiratory Syncytial Virus by PCR NEGATIVE NEGATIVE Final    Comment: (NOTE) Fact Sheet for Patients: PinkCheek.be  Fact Sheet for Healthcare Providers: GravelBags.it  This test is not yet approved or cleared by the [place] FDA and  has been authorized for detection and/or diagnosis of SARS-CoV-2 by  FDA under an Emergency Use Authorization (EUA). This EUA will remain  in effect (meaning this test can be used) for the duration of the  COVID-19 declaration under Section 564(b)(1) of the Act, 21 U.S.C.  section 360bbb-3(b)(1), unless the authorization is terminated or  revoked. Performed at [facility], [address], [place], [place] [address]          Radiology Studies: DG Chest 2 View  Result Date: 07/22/2020 CLINICAL DATA:  69 year old male with cough, shortness of breath, congested. COVID-19 status pending. EXAM: CHEST - 2 VIEW COMPARISON:  Chest radiographs 05/15/2014 and earlier. FINDINGS: PA and lateral views. Stable lung volumes and mediastinal contours, within normal limits. Visualized tracheal air column is within normal limits. No pneumothorax, pulmonary edema, pleural effusion or confluent pulmonary opacity. However, there is mildly increased interstitial opacity at both lung bases. No acute osseous abnormality identified. Negative visible bowel gas pattern. IMPRESSION: Mild increased interstitial opacity at both lung bases suspicious for acute viral/atypical respiratory infection in this setting. Electronically Signed   By: Genevie Ann M.D.   On: 07/22/2020 17:55        Scheduled Meds: . [MAR Hold] aerochamber Z-Stat Plus/medium  1 each Other Once  . [MAR Hold] atorvastatin  40 mg Oral QHS  . [MAR Hold] folic acid  1 mg Oral QHS  . [MAR Hold]  hydrochlorothiazide  12.5 mg Oral QHS  . [MAR Hold] insulin aspart  0-20 Units Subcutaneous Q4H  . [MAR Hold] insulin detemir  15 Units Subcutaneous BID  . [MAR Hold] isosorbide mononitrate  30 mg Oral Daily  . [MAR Hold] lisinopril  20 mg Oral Daily  . nicotine  21 mg Transdermal Once  . [MAR Hold] nicotine  21 mg Transdermal Daily  . [MAR Hold] pantoprazole (PROTONIX) IV  40 mg Intravenous Q12H  . [MAR Hold] sodium chloride flush  3 mL Intravenous Q12H   Continuous Infusions: . [MAR Hold] sodium chloride    .  sodium chloride    . lactated ringers       LOS: 1 day        Hosie Poisson, MD [facility]   To contact the attending provider between 7A-7P or the covering provider during after hours 7P-7A, please log into the web site [email or web address] and access using universal [place] password for that web site. If you do not have the password, please call the hospital operator.  07/23/2020, 2:18 PM

## 2020-07-23 NOTE — Anesthesia Preprocedure Evaluation (Addendum)
Anesthesia Evaluation  Patient identified by MRN, date of birth, ID band Patient awake    Reviewed: Allergy & Precautions, NPO status , Patient's Chart, lab work & pertinent test results  History of Anesthesia Complications (+) AWARENESS UNDER ANESTHESIANegative for: history of anesthetic complications  Airway Mallampati: II  TM Distance: >3 FB Neck ROM: Full    Dental  (+) Edentulous Upper, Edentulous Lower   Pulmonary COPD,  COPD inhaler and oxygen dependent, Current Smoker and Patient abstained from smoking.,     (-) decreased breath sounds+ wheezing  rales    Cardiovascular Exercise Tolerance: Poor hypertension, Pt. on medications + angina with exertion +CHF  Normal cardiovascular exam+ Valvular Problems/Murmurs  Rhythm:Regular Rate:Normal  26-Oct-2019 09:01:52 [place] [facility]-ER ROUTINE RECORD Sinus tachycardia Borderline right axis deviation Nonspecific repol abnormality, diffuse leads Since last tracing ST abnormality is new Otherwise no significant change Confirmed by Daleen Bo [id number]) on 10/26/2019 9:06:59 AM  Study Conclusions   - Left ventricle: Limited study with Definity contrast. Systolic  function was normal. The estimated ejection fraction was in the  range of 55% to 60%. Wall motion was normal; there were no  regional wall motion abnormalities.  - Mitral valve: There was trivial regurgitation.  - Pericardium, extracardiac: A prominent pericardial fat pad was  present.    Neuro/Psych negative neurological ROS  negative psych ROS   GI/Hepatic PUD, GERD (occasional)  ,  Endo/Other  diabetes, Poorly Controlled, Type 2, Oral Hypoglycemic Agents, Insulin Dependent  Renal/GU Renal disease     Musculoskeletal  (+) Arthritis , Osteoarthritis,    Abdominal   Peds  Hematology  (+) anemia ,   Anesthesia Other Findings   Reproductive/Obstetrics                             Anesthesia Physical  Anesthesia Plan  ASA: IV  Anesthesia Plan: General   Post-op Pain Management:    Induction: Intravenous  PONV Risk Score and Plan: 1 and TIVA  Airway Management Planned: Nasal Cannula, Natural Airway and Simple Face Mask  Additional Equipment:   Intra-op Plan:   Post-operative Plan: Possible Post-op intubation/ventilation  Informed Consent: I have reviewed the patients History and Physical, chart, labs and discussed the procedure including the risks, benefits and alternatives for the proposed anesthesia with the patient or authorized representative who has indicated his/her understanding and acceptance.       Plan Discussed with: Surgeon and CRNA  Anesthesia Plan Comments:         Anesthesia Quick Evaluation

## 2020-07-23 NOTE — Transfer of Care (Signed)
Immediate Anesthesia Transfer of Care Note  Patient: Gregory Alvarado  Procedure(s) Performed: ESOPHAGOGASTRODUODENOSCOPY (EGD) WITH PROPOFOL (N/A )  Patient Location: PACU  Anesthesia Type:General  Level of Consciousness: awake, alert  and patient cooperative  Airway & Oxygen Therapy: Patient Spontanous Breathing and Patient connected to face mask oxygen  Post-op Assessment: Report given to RN, Post -op Vital signs reviewed and stable and Patient moving all extremities X 4  Post vital signs: Reviewed and stable  Last Vitals:  Vitals Value Taken Time  BP    Temp    Pulse    Resp    SpO2      Last Pain:  Vitals:   07/23/20 1445  TempSrc:   PainSc: 4       Patients Stated Pain Goal: 0 (02/98/47 3085)  Complications: No complications documented.

## 2020-07-23 NOTE — ED Notes (Signed)
Report given to oncoming nurse.

## 2020-07-23 NOTE — ED Notes (Signed)
Pt states he is feeling worse. Note sent to Dr. Karleen Hampshire as below: pt reporting to me that his stomach is feeling worse, he states it feels tight and he is in pain. He has a ready bed room 313. I was about to take him upstairs but wanted to let you know he is feeling worse

## 2020-07-23 NOTE — ED Notes (Signed)
Transfusion of 2nd unit PRBC initiated.  

## 2020-07-23 NOTE — Anesthesia Postprocedure Evaluation (Signed)
Anesthesia Post Note  Patient: Gregory Alvarado  Procedure(s) Performed: ESOPHAGOGASTRODUODENOSCOPY (EGD) WITH PROPOFOL (N/A )  Patient location during evaluation: PACU Anesthesia Type: General Level of consciousness: awake, patient cooperative and oriented Pain management: pain level controlled Vital Signs Assessment: post-procedure vital signs reviewed and stable Respiratory status: spontaneous breathing and patient connected to face mask oxygen Cardiovascular status: stable Postop Assessment: no apparent nausea or vomiting Anesthetic complications: no   No complications documented.   Last Vitals:  Vitals:   07/23/20 1403 07/23/20 1530  BP: (!) 144/69 (P) 129/76  Pulse: 91 (!) (P) 101  Resp:  (!) (P) 26  Temp: 37.1 C (P) 36.9 C  SpO2: 99%     Last Pain:  Vitals:   07/23/20 1445  TempSrc:   PainSc: 4                  Clarabelle Oscarson

## 2020-07-23 NOTE — Brief Op Note (Signed)
07/22/2020 - 07/23/2020  3:30 PM  PATIENT:  Gregory Alvarado  69 y.o. male  PRE-OPERATIVE DIAGNOSIS:  melena  POST-OPERATIVE DIAGNOSIS:  9 AVM's  PROCEDURE:  Procedure(s): ESOPHAGOGASTRODUODENOSCOPY (EGD) WITH PROPOFOL (N/A)  SURGEON:  Surgeon(s) and Role:    * Harvel Quale, MD - Primary  Performed push enteroscopy under propofol sedation today.  Patient had normal esophagus and stomach.  There was evidence of 3 nonbleeding small AVMs in his duodenum and 5 nonbleeding small AVMs in his proximal jejunum, all of which were ablated with APC with adequate hemostasis.  No presence of hematin or any alterations were observed.  RECOMMENDATIONS: - Return patient to hospital [facility] for ongoing care.  - Clear liquid diet.  - If patient rebleeds clinically or has further drop in hemoglobin, may consider performing capsule endoscopy in-house. - May consider octreotide SQ if recurrent drop in hemoglobin.  Maylon Peppers, MD Gastroenterology and Hepatology [facility]

## 2020-07-24 ENCOUNTER — Inpatient Hospital Stay (HOSPITAL_COMMUNITY): Payer: Medicare Other

## 2020-07-24 DIAGNOSIS — D62 Acute posthemorrhagic anemia: Secondary | ICD-10-CM

## 2020-07-24 DIAGNOSIS — I5032 Chronic diastolic (congestive) heart failure: Secondary | ICD-10-CM

## 2020-07-24 DIAGNOSIS — R079 Chest pain, unspecified: Secondary | ICD-10-CM

## 2020-07-24 DIAGNOSIS — D649 Anemia, unspecified: Secondary | ICD-10-CM

## 2020-07-24 DIAGNOSIS — K921 Melena: Secondary | ICD-10-CM

## 2020-07-24 DIAGNOSIS — K56609 Unspecified intestinal obstruction, unspecified as to partial versus complete obstruction: Secondary | ICD-10-CM

## 2020-07-24 DIAGNOSIS — E538 Deficiency of other specified B group vitamins: Secondary | ICD-10-CM

## 2020-07-24 DIAGNOSIS — I10 Essential (primary) hypertension: Secondary | ICD-10-CM

## 2020-07-24 DIAGNOSIS — I340 Nonrheumatic mitral (valve) insufficiency: Secondary | ICD-10-CM

## 2020-07-24 DIAGNOSIS — N182 Chronic kidney disease, stage 2 (mild): Secondary | ICD-10-CM

## 2020-07-24 LAB — BPAM RBC
Blood Product Expiration Date: 202111282359
Blood Product Expiration Date: 202112092359
ISSUE DATE / TIME: 202111082128
ISSUE DATE / TIME: 202111090246
Unit Type and Rh: 6200
Unit Type and Rh: 6200

## 2020-07-24 LAB — ECHOCARDIOGRAM COMPLETE
AR max vel: 2.04 cm2
AV Area VTI: 2.48 cm2
AV Area mean vel: 1.97 cm2
AV Mean grad: 5.5 mmHg
AV Peak grad: 10.9 mmHg
Ao pk vel: 1.65 m/s
Area-P 1/2: 5.79 cm2
Height: 72 in
MV M vel: 5.3 m/s
MV Peak grad: 112.4 mmHg
S' Lateral: 4.14 cm
Weight: 4691.39 oz

## 2020-07-24 LAB — TYPE AND SCREEN
ABO/RH(D): A POS
Antibody Screen: NEGATIVE
Unit division: 0
Unit division: 0

## 2020-07-24 LAB — GLUCOSE, CAPILLARY
Glucose-Capillary: 127 mg/dL — ABNORMAL HIGH (ref 70–99)
Glucose-Capillary: 132 mg/dL — ABNORMAL HIGH (ref 70–99)
Glucose-Capillary: 139 mg/dL — ABNORMAL HIGH (ref 70–99)
Glucose-Capillary: 145 mg/dL — ABNORMAL HIGH (ref 70–99)
Glucose-Capillary: 147 mg/dL — ABNORMAL HIGH (ref 70–99)
Glucose-Capillary: 156 mg/dL — ABNORMAL HIGH (ref 70–99)
Glucose-Capillary: 168 mg/dL — ABNORMAL HIGH (ref 70–99)
Glucose-Capillary: 171 mg/dL — ABNORMAL HIGH (ref 70–99)

## 2020-07-24 LAB — CBC
HCT: 31.5 % — ABNORMAL LOW (ref 39.0–52.0)
Hemoglobin: 8.9 g/dL — ABNORMAL LOW (ref 13.0–17.0)
MCH: 22.2 pg — ABNORMAL LOW (ref 26.0–34.0)
MCHC: 28.3 g/dL — ABNORMAL LOW (ref 30.0–36.0)
MCV: 78.6 fL — ABNORMAL LOW (ref 80.0–100.0)
Platelets: 273 10*3/uL (ref 150–400)
RBC: 4.01 MIL/uL — ABNORMAL LOW (ref 4.22–5.81)
RDW: 20.2 % — ABNORMAL HIGH (ref 11.5–15.5)
WBC: 16 10*3/uL — ABNORMAL HIGH (ref 4.0–10.5)
nRBC: 0 % (ref 0.0–0.2)

## 2020-07-24 LAB — BASIC METABOLIC PANEL
Anion gap: 11 (ref 5–15)
BUN: 17 mg/dL (ref 8–23)
CO2: 27 mmol/L (ref 22–32)
Calcium: 9.2 mg/dL (ref 8.9–10.3)
Chloride: 93 mmol/L — ABNORMAL LOW (ref 98–111)
Creatinine, Ser: 1.05 mg/dL (ref 0.61–1.24)
GFR, Estimated: >60 mL/min (ref >60)
Glucose, Bld: 146 mg/dL — ABNORMAL HIGH (ref 70–99)
Potassium: 3.8 mmol/L (ref 3.5–5.1)
Sodium: 131 mmol/L — ABNORMAL LOW (ref 135–145)

## 2020-07-24 MED ORDER — PERFLUTREN LIPID MICROSPHERE
1.0000 mL | INTRAVENOUS | Status: AC | PRN
  Administered 2020-07-24: 2 mL via INTRAVENOUS
  Filled 2020-07-24: qty 10

## 2020-07-24 MED ORDER — SODIUM CHLORIDE 0.9 % IV SOLN: INTRAVENOUS | Status: AC

## 2020-07-24 MED ORDER — TRAMADOL HCL 50 MG PO TABS
50.0000 mg | ORAL_TABLET | Freq: Two times a day (BID) | ORAL | Status: DC | PRN
  Administered 2020-07-25 – 2020-07-29 (×3): 50 mg via ORAL
  Filled 2020-07-24 (×4): qty 1

## 2020-07-24 MED ORDER — DEXTROSE 5 % IV SOLN
250.0000 mg | INTRAVENOUS | Status: DC
  Filled 2020-07-24 (×2): qty 250

## 2020-07-24 MED ORDER — HYDROMORPHONE HCL 1 MG/ML IJ SOLN
2.0000 mg | INTRAMUSCULAR | Status: DC | PRN
  Administered 2020-07-24 – 2020-07-28 (×11): 2 mg via INTRAVENOUS
  Filled 2020-07-24 (×12): qty 2

## 2020-07-24 MED FILL — Insulin Aspart Inj 100 Unit/ML: SUBCUTANEOUS | Qty: 0.04 | Status: AC

## 2020-07-24 NOTE — Progress Notes (Signed)
GI Inpatient Follow-up Note  Subjective: Reports abdominal pain beginning yesterday around 10 AM.  He reports tightness in his mid abdomen.  Does not feel pain was present on admission.  Does not feel it is progressed overnight and feels it is similar to onset.  He reports some heaving overnight.  Last bowel movement was yesterday and appeared normal.  Reports he got cranberry juice broth and coffee for breakfast.  His pain is resolved since he received 2 units of blood.  Scheduled Inpatient Medications:  . aerochamber Z-Stat Plus/medium  1 each Other Once  . atorvastatin  40 mg Oral QHS  . azithromycin  500 mg Oral Daily  . folic acid  1 mg Oral QHS  . hydrochlorothiazide  12.5 mg Oral QHS  . insulin aspart  0-20 Units Subcutaneous Q4H  . insulin detemir  15 Units Subcutaneous BID  . isosorbide mononitrate  30 mg Oral Daily  . lisinopril  20 mg Oral Daily  . nicotine  21 mg Transdermal Daily  . pantoprazole (PROTONIX) IV  40 mg Intravenous Q12H  . sodium chloride flush  3 mL Intravenous Q12H    Continuous Inpatient Infusions:   . sodium chloride      PRN Inpatient Medications:  sodium chloride, ipratropium-albuterol, morphine injection, ondansetron **OR** ondansetron (ZOFRAN) IV, polyethylene glycol, simethicone, sodium chloride flush, traMADol, zolpidem  Review of Systems: Constitutional: Weight is stable.  Eyes: No changes in vision. ENT: No oral lesions, sore throat.  GI: see HPI.  Heme/Lymph: No easy bruising.  CV: No chest pain.  GU: No hematuria.  Integumentary: No rashes.  Neuro: No headaches.  Psych: No depression/anxiety.  Endocrine: No heat/cold intolerance.  Allergic/Immunologic: No urticaria.  Resp: No cough, SOB.  Musculoskeletal: No joint swelling.    Physical Examination: BP 138/67 (BP Location: Right Arm)   Pulse (!) 102   Temp 98.3 F (36.8 C) (Oral)   Resp 19   Ht 6' (1.829 m)   Wt 133 kg   SpO2 94%   BMI 39.77 kg/m  Gen: NAD, alert and  oriented x 4 HEENT: PEERLA, EOMI, Neck: supple, no JVD or thyromegaly Chest: CTA bilaterally, no wheezes, crackles, or other adventitious sounds CV: RRR, no m/g/c/r Abd: soft, full but not tensely distended. TTP mid upper abd. Hypoactive bowel sounds  Ext: no edema, well perfused with 2+ pulses, Skin: no rash or lesions noted Lymph: no LAD  Data: Lab Results  Component Value Date   WBC 16.0 (H) 07/24/2020   HGB 8.9 (L) 07/24/2020   HCT 31.5 (L) 07/24/2020   MCV 78.6 (L) 07/24/2020   PLT 273 07/24/2020   Recent Labs  Lab 07/23/20 0600 07/23/20 1931 07/24/20 0435  HGB 8.5* 8.7* 8.9*   Lab Results  Component Value Date   NA 131 (L) 07/24/2020   K 3.8 07/24/2020   CL 93 (L) 07/24/2020   CO2 27 07/24/2020   BUN 17 07/24/2020   CREATININE 1.05 07/24/2020   Lab Results  Component Value Date   ALT 10 07/22/2020   AST 10 (L) 07/22/2020   ALKPHOS 62 07/22/2020   BILITOT 0.4 07/22/2020   No results for input(s): APTT, INR, PTT in the last 168 hours.   IMPRESSION: CT Scan yesterday  1. Small-bowel obstruction, transition in the mid jejunum in the central abdomen as above. 2. Lymphadenopathy within the bilateral external iliac and retroperitoneal distributions as above, of uncertain etiology. 3. Cholelithiasis without cholecystitis. 4. Left adrenal myelolipoma, increased in size since prior  study now measuring 3.4 cm. 5. 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 6.  Aortic Atherosclerosis (ICD10-I70.0).  Assessment/Plan: Mr. Vancuren is a 69 y.o. male with past medical history of prior gastric ulcers, COPD, diabetes, chronic kidney disease, hypertension, hyperlipidemia, heart failure, IDA, history of H. pylori in August 2021 admitted for dark-colored stools.  Pale & tachycardic in ER.  Found to have hemoglobin 6.2 with ferritin of 4.  1.  Iron deficiency anemia-severe.  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  3 angiectasia's in jejunum treated with APC.  Normal esophagus and stomach.  Currently on clear liquid diet.  Morning hemoglobin was stable at 8.9. On PPI BID. Denies dark stool overnight.  2. Abd pain - acute x 24 hrs. CT last night showed small bowel obstruction.  Reports a history of surgery for diverticulitis many years ago, likely due to adhesions. Will check acute abdominal series and transition back to n.p.o. except oral meds.   3.  Leukocytosis-white blood count increased to 16 this morning. Was 10.3 on admission. On zithromax since yesterday. Afebrile. Plan as above.     Recommendations: 1. NPO for now given SBO  2. Acute abd series 3. Continue PPI BID and monitoring H/H   Dr Laural Golden to see patient after xray with further recommendations   Please call with questions or concerns.    Ronney Asters, PA-C [facility]

## 2020-07-24 NOTE — Consult Note (Addendum)
Cardiology Consult    Patient ID: Gregory Alvarado; [id number]; 19-Jun-1951   Admit date: 07/22/2020 Date of Consult: 07/24/2020  Primary Care Provider: Celene Squibb, MD Primary Cardiologist: Carlyle Dolly, MD   Patient Profile    Gregory Alvarado is a 69 y.o. male with past medical history of chronic diastolic CHF, HTN, HLD, Type 2 DM, Stage 2-3 CKD, symptomatic anemia/GIB (occurring in 10/2019 and EGD showing gastric ulcers) and COPD who is being seen today for the evaluation of chest pain at the request of Dr. Karleen Hampshire.   History of Present Illness    Gregory Alvarado was examined in the office by Kerin Ransom, PA-C on 07/09/2020 and reported having chest pain with exertion for the past 3 weeks which would radiate down his arms but also reported continuing to have dark stools. Was not felt to be a candidate for NST given his wheezing and cath was not ideal given his continued melena and renal insufficiency. His case was reviewed with Dr. Harl Bowie and it was recommended to add Imdur 30mg  daily and recheck labs which showed Hgb at 8.4 and creatinine improved to 1.07. Scheduled to see Dr. Harl Bowie next week for reassessment of his symptoms.   He presented to [facility] ED on 07/22/2020 after being found to have progressive anemia with Hgb at 6.4 when checked by his PCP. Reported still having dark stools and had experienced worsening dyspnea and fatigue for the past week. Initial labs showed WBC 10.3, Hgb 6.2, platelets 264, Ferritin 4, Na+ 134, K+ 4.4 and creatinine 1.08. BNP 165. Hgb A1c 5.7. Initial HS Troponin 41 with repeat of 50. CXR showed mild increased interstitial opacity at both lung bases suspicious for acute viral/atypical respiratory infection (COVID negative this admission). EKG shows sinus tachycardia, HR 114 with ST depression along the lateral leads which is similar to prior tracings.   He did receive 2 units pRBC's on admission. 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 CT Abdomen was also performed yesterday and showed a small bowel obstruction , lymphadenopathy and a left adrenal myelolipoma. Repeat labs this AM show Hgb is stable at 8.9. He also had a repeat EKG this AM that shows the ST depression along his lateral leads has resolved when compared to his prior tracing on admission.   Cardiology is asked to see for reports of exertional chest pain prior to admission. In talking with the patient today, he reports he was having pain along his epigastric region prior to admission which was sometimes worse with activity but could also occur at rest. Upon receiving his transfusion on admission, he reports that pain resolved and has not represented. Still reports generalized abdominal discomfort which waxes and wanes. He has been mostly in bed while on the floor but says he has walked to and from the bathroom and feels like his dyspnea has improved. No specific orthopnea or PND. He does have chronic lower extremity edema but says he cannot take Lasix daily due to lower extremity cramps.  Past Medical History:  Diagnosis Date   Anemia    Arthritis    left shoulder   COPD (chronic obstructive pulmonary disease) (HCC)    Diabetes mellitus without complication ([place])    Dyslipidemia    Hypertension    Insomnia    Insomnia    Morbid obesity ([place])    Tobacco use  Past Surgical History:  Procedure Laterality Date   BIOPSY  10/28/2019   Procedure: BIOPSY;  Surgeon: Danie Binder, MD;  Location: [facility];  Service: Endoscopy;;  gastric   BIOPSY  04/15/2020   Procedure: BIOPSY;  Surgeon: Daneil Dolin, MD;  Location: [facility];  Service: Endoscopy;;  gastric   COLON RESECTION  1990s?   DIVERTICULITIS   COLONOSCOPY WITH PROPOFOL N/A 04/15/2020   Rourk: Poor colon prep. two 6 to 8 mm polyps removed from the hepatic flexure, tubular  adenomas.  Repeat colonoscopy later this year.   ESOPHAGOGASTRODUODENOSCOPY (EGD) WITH PROPOFOL N/A 10/28/2019   Dr. Oneida Alar: Multiple cratered gastric ulcers, gastritis/duodenitis.  Path showed H. pylori.  Patient treated with amoxicillin/Biaxin/PPI twice daily.   ESOPHAGOGASTRODUODENOSCOPY (EGD) WITH PROPOFOL N/A 04/15/2020   Rourk: Patchy gastric erythema status post biopsy to document eradication of H. pylori.  Biopsy showed persistent H. pylori.  Previously noted gastric ulcer is completely healed.   HERNIA REPAIR     UHR   MALONEY DILATION N/A 04/15/2020   Procedure: Venia Minks DILATION;  Surgeon: Daneil Dolin, MD;  Location: [facility];  Service: Endoscopy;  Laterality: N/A;   POLYPECTOMY  04/15/2020   Procedure: POLYPECTOMY;  Surgeon: Daneil Dolin, MD;  Location: [facility];  Service: Endoscopy;;  colon   ROTATOR CUFF REPAIR Bilateral    TYMPANOPLASTY       Home Medications:  Prior to Admission medications   Medication Sig Start Date End Date Taking? Authorizing Provider  acetaminophen (TYLENOL) 325 MG tablet Take 2 tablets (650 mg total) by mouth every 6 (six) hours as needed for mild pain (or Fever >/= 101). 10/29/19  Yes Emokpae, Courage, MD  albuterol (VENTOLIN HFA) 108 (90 Base) MCG/ACT inhaler Inhale 2 puffs into the lungs every 6 (six) hours as needed for wheezing or shortness of breath. 10/29/19  Yes Emokpae, Courage, MD  atorvastatin (LIPITOR) 40 MG tablet Take 40 mg by mouth at bedtime. 08/30/19  Yes [provider]  cyanocobalamin (,VITAMIN B-12,) 1000 MCG/ML injection Inject 1,000 mcg into the muscle every 30 (thirty) days.    Yes [provider]  folic acid (FOLVITE) 1 MG tablet Take 1 tablet (1 mg total) by mouth daily. Patient taking differently: Take 1 mg by mouth at bedtime.  10/30/19  Yes Emokpae, Courage, MD  hydrochlorothiazide (HYDRODIURIL) 12.5 MG tablet Take 12.5 mg by mouth at bedtime.  01/23/20  Yes [provider]  insulin  NPH-regular Human (NOVOLIN 70/30) (70-30) 100 UNIT/ML injection Inject 50 Units into the skin in the morning and at bedtime. Inject 50 units in morning and 60 units every evening   Yes [provider]  isosorbide mononitrate (IMDUR) 30 MG 24 hr tablet Take 1 tablet (30 mg total) by mouth daily. May take 2 if needed 07/09/20 10/07/20 Yes Kilroy, Lurena Joiner K, PA-C  lisinopril (PRINIVIL,ZESTRIL) 20 MG tablet Take 1 tablet (20 mg total) by mouth daily. 06/09/17  Yes Nida, Marella Chimes, MD  nitroGLYCERIN (NITROSTAT) 0.4 MG SL tablet Place 1 tablet (0.4 mg total) under the tongue every 5 (five) minutes as needed for chest pain. 07/09/20 10/07/20 Yes Kilroy, Luke K, PA-C  pantoprazole (PROTONIX) 40 MG tablet Take 1 tablet (40 mg total) by mouth 2 (two) times daily before a meal. 04/25/20  Yes Mahala Menghini, PA-C  traMADol (ULTRAM) 50 MG tablet Take 50 mg by mouth in the morning and at bedtime.  09/20/19  Yes [provider]  zolpidem (  AMBIEN) 10 MG tablet Take 10 mg by mouth at bedtime.    Yes [provider]  Insulin Pen Needle (B-D ULTRAFINE III SHORT PEN) 31G X 8 MM MISC 1 each by Does not apply route as directed. 01/05/17   Cassandria Anger, MD  INSULIN SYRINGE .5CC/29G 29G X 1/2" 0.5 ML MISC Use to inject insulin 2 times a day 02/24/17   Cassandria Anger, MD  ONE TOUCH ULTRA TEST test strip TEST FOUR TIMES DAILY AS DIRECTED. 05/21/17   Cassandria Anger, MD    Inpatient Medications: Scheduled Meds:  aerochamber Z-Stat Plus/medium  1 each Other Once   atorvastatin  40 mg Oral QHS   azithromycin  500 mg Oral Daily   folic acid  1 mg Oral QHS   hydrochlorothiazide  12.5 mg Oral QHS   insulin aspart  0-20 Units Subcutaneous Q4H   insulin detemir  15 Units Subcutaneous BID   isosorbide mononitrate  30 mg Oral Daily   lisinopril  20 mg Oral Daily   nicotine  21 mg Transdermal Daily   pantoprazole (PROTONIX) IV  40 mg Intravenous Q12H   sodium chloride flush   3 mL Intravenous Q12H   Continuous Infusions:  sodium chloride     PRN Meds: sodium chloride, ipratropium-albuterol, morphine injection, ondansetron **OR** ondansetron (ZOFRAN) IV, polyethylene glycol, simethicone, sodium chloride flush, traMADol, zolpidem  Allergies:    Allergies  Allergen Reactions   Caduet [Amlodipine-Atorvastatin] Swelling    Social History:   Social History   Socioeconomic History   Marital status: Divorced    Spouse name: Not on file   Number of children: Not on file   Years of education: Not on file   Highest education level: Not on file  Occupational History   Not on file  Tobacco Use   Smoking status: Current Every Day Smoker    Packs/day: 2.00    Types: Cigarettes   Smokeless tobacco: Never Used  Vaping Use   Vaping Use: Never used  Substance and Sexual Activity   Alcohol use: Not Currently    Comment: rare   Drug use: No   Sexual activity: Not on file  Other Topics Concern   Not on file  Social History Narrative   Not on file   Social Determinants of Health   Financial Resource Strain:    Difficulty of Paying Living Expenses: Not on file  Food Insecurity:    Worried About [place] in the Last Year: Not on file   Ran Out of Food in the Last Year: Not on file  Transportation Needs:    Lack of Transportation (Medical): Not on file   Lack of Transportation (Non-Medical): Not on file  Physical Activity:    Days of Exercise per Week: Not on file   Minutes of Exercise per Session: Not on file  Stress:    Feeling of Stress : Not on file  Social Connections:    Frequency of Communication with Friends and Family: Not on file   Frequency of Social Gatherings with Friends and Family: Not on file   Attends Religious Services: Not on file   Active Member of Clubs or Organizations: Not on file   Attends Archivist Meetings: Not on file   Marital Status: Not on file  Intimate Partner  Violence:    Fear of Current or Ex-Partner: Not on file   Emotionally Abused: Not on file   Physically Abused: Not on file  Sexually Abused: Not on file     Family History:    Family History  Problem Relation Age of Onset   Diabetes Mother    Heart attack Mother    Heart attack Father    Heart attack Brother    Colon cancer Neg Hx    Stomach cancer Neg Hx       Review of Systems    General:  No chills, fever, night sweats or weight changes.  Cardiovascular:  No chest pain, orthopnea, palpitations, paroxysmal nocturnal dyspnea. Positive for dyspnea on exertion and edema.  Dermatological: No rash, lesions/masses Respiratory: No cough, dyspnea Urologic: No hematuria, dysuria Abdominal:   No nausea, vomiting, diarrhea, bright red blood per rectum, or hematemesis. Positive for melena and abdominal pain.  Neurologic:  No visual changes, wkns, changes in mental status. All other systems reviewed and are otherwise negative except as noted above.  Physical Exam/Data    Vitals:   07/23/20 2304 07/24/20 0101 07/24/20 0540 07/24/20 0800  BP: (!) 154/87 (!) 144/58 (!) 156/58 138/67  Pulse: (!) 101 98 (!) 104 (!) 102  Resp: 20 20 20 19   Temp: 98 F (36.7 C) 98.2 F (36.8 C) 98.4 F (36.9 C) 98.3 F (36.8 C)  TempSrc: Oral Oral Oral Oral  SpO2: 96% 99% 94%   Weight:      Height:        Intake/Output Summary (Last 24 hours) at 07/24/2020 0856 Last data filed at 07/24/2020 0700 Gross per 24 hour  Intake 500 ml  Output 350 ml  Net 150 ml   Filed Weights   07/23/20 1607  Weight: 133 kg   Body mass index is 39.77 kg/m.   General: Pleasant obese male appearing in NAD Psych: Normal affect. Neuro: Alert and oriented X 3. Moves all extremities spontaneously. HEENT: Normal  Neck: Supple without bruits or JVD. Lungs:  Resp regular and unlabored, scattered rhonchi throughout lung fields. Heart: Regular rhythm, tachycardiac. No s3, s4, or murmurs. Abdomen: Soft,  non-tender, non-distended, BS + x 4.  Extremities: No clubbing or cyanosis. 1+ pitting edema bilaterally. DP/PT/Radials 2+ and equal bilaterally.   EKG:  The EKG was personally reviewed and demonstrates: Sinus tachycardia, HR 114 with ST depression along the lateral leads which is similar to prior tracings.   Telemetry:  Not currently connected to telemetry.    Labs/Studies     Relevant CV Studies:  Echo Complete: 08/2018 Study Conclusions   - Left ventricle: The cavity size was normal. Wall thickness was  increased increased in a pattern of mild to moderate LVH.  Systolic function was mildly to moderately reduced. The estimated  ejection fraction was in the range of 40% to 45%. Diffuse  hypokinesis. Doppler parameters are consistent with abnormal left  ventricular relaxation (grade 1 diastolic dysfunction). Doppler  parameters are consistent with high ventricular filling pressure.  - Aortic valve: Valve area (VTI): 3.22 cm^2. Valve area (Vmax):  2.74 cm^2. Valve area (Vmean): 2.29 cm^2.  - Left atrium: The atrium was moderately dilated.  - Atrial septum: No defect or patent foramen ovale was identified.  - Techncically difficult study, recommend limited study with  echocontrast to better clarify LVEF. Based on available images  appears mildly decreased.   Limited Echo: 08/2018 Study Conclusions   - Left ventricle: Limited study with Definity contrast. Systolic  function was normal. The estimated ejection fraction was in the  range of 55% to 60%. Wall motion was normal; there were no  regional wall  motion abnormalities.  - Mitral valve: There was trivial regurgitation.  - Pericardium, extracardiac: A prominent pericardial fat pad was  present.    Laboratory Data:  Chemistry Recent Labs  Lab 07/22/20 1938 07/23/20 0600 07/24/20 0435  NA 134* 137 131*  K 4.4 4.0 3.8  CL 98 97* 93*  CO2 28 29 27   GLUCOSE 170* 152* 146*  BUN 15 15 17    CREATININE 1.08 1.13 1.05  CALCIUM 9.0 9.6 9.2  GFRNONAA >60 >60 >60  ANIONGAP 8 11 11     Recent Labs  Lab 07/22/20 1938  PROT 6.8  ALBUMIN 3.6  AST 10*  ALT 10  ALKPHOS 62  BILITOT 0.4   Hematology Recent Labs  Lab 07/22/20 1938 07/22/20 1938 07/23/20 0600 07/23/20 1931 07/24/20 0435  WBC 10.3  --  12.0*  --  16.0*  RBC 2.87*  --  3.69*  --  4.01*  HGB 6.2*   < > 8.5* 8.7* 8.9*  HCT 22.6*   < > 29.4* 30.5* 31.5*  MCV 78.7*  --  79.7*  --  78.6*  MCH 21.6*  --  23.0*  --  22.2*  MCHC 27.4*  --  28.9*  --  28.3*  RDW 20.8*  --  19.9*  --  20.2*  PLT 264  --  296  --  273   < > = values in this interval not displayed.   Cardiac EnzymesNo results for input(s): TROPONINI in the last 168 hours. No results for input(s): TROPIPOC in the last 168 hours.  BNP Recent Labs  Lab 07/22/20 1936  BNP 165.0*    DDimer No results for input(s): DDIMER in the last 168 hours.  Radiology/Studies:  DG Chest 2 View  Result Date: 07/22/2020 CLINICAL DATA:  69 year old male with cough, shortness of breath, congested. COVID-19 status pending. EXAM: CHEST - 2 VIEW COMPARISON:  Chest radiographs 05/15/2014 and earlier. FINDINGS: PA and lateral views. Stable lung volumes and mediastinal contours, within normal limits. Visualized tracheal air column is within normal limits. No pneumothorax, pulmonary edema, pleural effusion or confluent pulmonary opacity. However, there is mildly increased interstitial opacity at both lung bases. No acute osseous abnormality identified. Negative visible bowel gas pattern. IMPRESSION: Mild increased interstitial opacity at both lung bases suspicious for acute viral/atypical respiratory infection in this setting. Electronically Signed   By: Genevie Ann M.D.   On: 07/22/2020 17:55   CT ABDOMEN PELVIS W CONTRAST  Result Date: 07/23/2020 CLINICAL DATA:  Nausea and abdominal pain since noon today EXAM: CT ABDOMEN AND PELVIS WITH CONTRAST TECHNIQUE: Multidetector CT  imaging of the abdomen and pelvis was performed using the standard protocol following bolus administration of intravenous contrast. CONTRAST:  112mL OMNIPAQUE IOHEXOL 300 MG/ML  SOLN COMPARISON:  03/12/2015 FINDINGS: Lower chest: No acute pleural or parenchymal lung disease. Hepatobiliary: Calcified gallstones are identified without cholecystitis. The liver is unremarkable. No biliary dilation. Pancreas: Unremarkable. No pancreatic ductal dilatation or surrounding inflammatory changes. Spleen: Normal in size without focal abnormality. Adrenals/Urinary Tract: Interval enlargement of the left adrenal myelolipoma, now measuring 3.4 x 3.2 cm. The right adrenal is unremarkable. There is bilateral renal cortical thinning. Stable cyst upper pole left kidney. Otherwise the kidneys enhance normally and symmetrically. No urinary tract calculi or obstructive uropathy. Bladder is decompressed, limiting its evaluation. Stomach/Bowel: There is dilation of the proximal small bowel measuring up to 3.3 cm in diameter. Scattered gas fluid levels are seen. There is abrupt transition within the mid jejunum in the central  abdomen, reference image 63 of series 2. This is likely due to adhesion, given the adjacent postsurgical changes from hernia repair. Normal appendix right lower quadrant. Scattered diverticulosis of the distal colon, without diverticulitis. Vascular/Lymphatic: Aortic atherosclerosis. Pathologic adenopathy is seen within the pelvis, with a 17 mm short axis left external iliac lymph node seen on image 80. 18 mm short axis right external iliac node, reference image 82. There are multiple subcentimeter lymph nodes within the retroperitoneum and left para-aortic distribution. Reproductive: Prostate is not enlarged. Other: No free fluid or free gas. Postsurgical changes from previous midline ventral hernia repair. No residual or recurrent hernia. Musculoskeletal: There are no acute or destructive bony lesions. There is  bilateral L5 spondylolysis, with grade 2 anterolisthesis of L5 on S1 and severe spondylosis. Reconstructed images demonstrate no additional findings. IMPRESSION: 1. Small-bowel obstruction, transition in the mid jejunum in the central abdomen as above. 2. Lymphadenopathy within the bilateral external iliac and retroperitoneal distributions as above, of uncertain etiology. 3. Cholelithiasis without cholecystitis. 4. Left adrenal myelolipoma, increased in size since prior study now measuring 3.4 cm. 5. 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 6.  Aortic Atherosclerosis (ICD10-I70.0). Electronically Signed   By: Randa Ngo M.D.   On: 07/23/2020 20:08     Assessment & Plan    1. Exertional Chest Pain - Reported pain mostly along his epigastric region prior to admission but this was at times associated with exertion. Resolved upon receiving 2 units pRBC's on admission.  - His EKG on admission showed ST depression along the lateral leads but this was in the setting of his anemia with Hgb at 6.2. Repeat tracing this AM shows ST depression has resolved. HS Troponin values flat at 41 and 50. An echocardiogram is pending to assess LV function and wall motion. Given his anemia, AVM's by Endoscopy and SBO by repeat CT Abdomen, would not anticipate further cardiac testing this admission. If EF reduced by echo, would anticipate adjusting his medications and reassessing as an outpatient. NST previously not pursued given his intermittent wheezing. Not a cardiac catheterization candidate at this time given his recurrent GIB and concerns with being able to tolerate DAPT. Suspect his symptoms were exacerbated by his anemia as both his dyspnea and chest discomfort have improved with Hgb trending up. Continue Imdur 30mg  daily (which can be further titrated as an outpatient if needed) and Atorvastatin 40mg  daily. Not on a BB given COPD and prior wheezing. Will order cardiac telemetry given his  persistent tachycardia.   2. Chronic Diastolic CHF - BNP mildly elevated at 165 on admission and he did receive IV Lasix 20mg  x1 along with being continued on HCTZ 12.5mg  daily. He does have 1+ pitting edema on examination today. Given his decreased oral intake, will hold on further IV Lasix for now.   3. Symptomatic Anemia/GIB/SBO - Previously hospitalized for GIB in 10/2019 and EGD showing gastric ulcers. Hgb was 6.2 this admission and EGD performed yesterday showed AVM's as outlined above and CT Abdomen shows a SBO. Further management per GI.   4. Abnormal CXR - Imaging on admission showed mild increased interstitial opacity at both lung bases suspicious for acute viral/atypical respiratory infection (COVID negative this admission). He was started on Azithromycin by the admitting team.   5. HTN - BP has been variable from 126/74 - 156/58 within the past 24 hours, at 138/67 on most recent check. Continue Lisinopril, Imdur and HCTZ for now.  6. Stage 2-3 CKD -  Creatinine stable at 1.05 this AM which is close to his baseline. Na+ low at 131 and will continue to follow. May need to hold HCTZ if hyponatremia persists.    For questions or updates, please contact [place] Please consult [email or web address] for contact info under Cardiology/STEMI.  Signed, Erma Heritage, PA-C 07/24/2020, 8:56 AM Pager: [phone]  Attending note Patient seen and discussed with PA Ahmed Prima, I agree with her documentation. 69 yo male history of chronic diastolic HF and LE edema, HTN, CKD 2-3, COPD, DM2 admitted with Hgb of 6.4 along with SOB and DOE. From notes FOBT + in ER    Hgb 6.4 Ferritin 4 BNP 165 K 4.4 Cr 1.08  hstrop 41-->50--> COVID neg CXR mild opacity bilateral bases CT A/P: SBO EKG SR, chronic ST/T changes 08/2018 echo LVEF 55-60%, no WMAs  Patient admitted with Hgb of 6.4 from outpatient labs. In this setting he has had exertional chest pain and SOB. Ongoing workup by GI, has been  transfused. Symptoms would be consistent with symptomatic anemia. He has a very mild flat troponin and chronic changes on EKG, would not suggest a combined component of ACS. In general not a cath candidate with severe anemia, FOBT +. No indication for noninvasive ischemic testing. Can f/u his pending echo. No other cardiac plans at this time. Management of his symptomatic anemia by GI and primary team, we will sign off. If recurrent symptoms once his anemia is resolved could reconsider ischemic testing as outpatient.    Carlyle Dolly MD

## 2020-07-24 NOTE — Progress Notes (Signed)
*  PRELIMINARY RESULTS* Echocardiogram 2D Echocardiogram has been performed.  Leavy Cella 07/24/2020, 11:36 AM

## 2020-07-24 NOTE — Progress Notes (Signed)
PROGRESS NOTE    Gregory Alvarado  NID:[id number] DOB: 06/18/1951 DOA: 07/22/2020 PCP: Celene Squibb, MD    Chief Complaint  Patient presents with  . Shortness of Breath  . low hemoglobin    Brief Narrative:  69 year old gentleman with known prior history of tobacco abuse, COPD, diabetes, hypertension, obesity, hyperlipidemia sent to ED from office with a low hemoglobin.  In addition patient patient reports exertional chest pain. GI and gastroenterology consulted.  Patient is scheduled to undergo EGD later today.  Assessment & Plan:   Principal Problem:   Symptomatic anemia Active Problems:   Essential hypertension   Insulin dependent type 2 diabetes mellitus (HCC)   COPD (chronic obstructive pulmonary disease) (HCC)   Current smoker   Iron deficiency anemia   Melena   GI bleed   Chronic diastolic CHF (congestive heart failure) (HCC)   Guaiac positive stools   Folate deficiency   Acute anemia of blood loss/GI bleed  -Secondary to AVMs -Continue PPI -patient is status post 1 unit PRBC transfusion and IV iron infusion. -Hemoglobin 8.9 -No overt bleeding reported -Continue to follow GI service recommendations.    Nausea, abdominal pain: Secondary to SBO -N.p.o. status -Supportive care and conservative management -General surgery has been consulted there is some transition point into the jejunal and a prior history of surgical intervention due to diverticulitis in the past; very suggestive for adhesions. -Follow abdominal x-ray -Continue as needed analgesics and antiemetics -No actively vomiting and will hold on the use of NG tube currently. -LFTs within normal limits.  Chronic diastolic heart failure -Appears stable and euvolemic -Will hold HCTZ at this time in the setting of SBO and n.p.o. status.  Exertional chest pain -Minimally elevated troponins and EKG shows ST and T wave abnormalities in the lateral leads and elevated BNP.  -Appears to be demand ischemia in  the setting of anemia -Telemetry and 2D echo reassuring -Cardiology consultation appreciated; no further ischemic work-up anticipated. -Continue risk factor modifications and outpatient follow-up.  Iron deficiency/ microcytic anemia and folate deficiency:  -Status post iron infusion -Continue folic acid supplementation -Patient is status 1 unit of PRBC transfusion during this admission -Hemoglobin stable at 8.9 currently -Follow hemoglobin trend.  Leukocytosis: -Stress demargination and transfusion -CXR shows  Mild increased interstitial opacity at both lung bases suspicious for acute viral/atypical respiratory infection. He is a smoker and has a cough.  Suggesting bronchitis component. -Continue Zithromax.  COPD/ current smoker:  -Cessation counseling has been provided -Good oxygen saturation on room air and no requiring oxygen supplementation -No wheezing on examination. -Continue as needed bronchodilator therapy  Essential Hypertension:  -Currently stable and well-controlled -Continue current antihypertensive agents -Follow vital signs.  Type 2 DM:  CBG (last 3)  Recent Labs    07/24/20 0357 07/24/20 0805 07/24/20 1200  GLUCAP 168* 127* 147*  -Continue to follow CBGs -last A1c is 5.7% -Continue the use of sliding scale insulin and Levemir.   Active smoker on nicotine patch.    DVT prophylaxis: scd's Code Status: (Full code) Family Communication: No family at bedside; patient expressed that he will update them by himself. Disposition:   Status is: Inpatient  Remains inpatient appropriate because:Ongoing diagnostic testing needed not appropriate for outpatient work up, IV treatments appropriate due to intensity of illness or inability to take PO and Inpatient level of care appropriate due to severity of illness   Dispo: The patient is from: Home  Anticipated d/c is to: Home              Anticipated d/c date is: 2 days              Patient  currently is not medically stable to d/c.  Patient is complaining of dry heaving/nausea and severe abdominal pain.  Images demonstrating SBO.  We will keep n.p.o., provide fluid resuscitation, antiemetics and analgesics.  Follow general surgery and GI recommendations.  Follow electrolytes and replete as needed.     Consultants:   Cardiology  GI  General surgery  Procedures: egd ECHOCARDIOGRAM: See below for full details.  But in summary preserved ejection fraction, no wall motion abnormalities, no significant valvular disorder.   Antimicrobials: Azithromycin.     Subjective: Reports dry heaving, no chest pain, no shortness of breath.  Complaining of significant abdominal pain mid/lower quadrants.  Patient is afebrile.  Reports decreased appetite.  She denies overt bleeding.  Objective: Vitals:   07/23/20 2304 07/24/20 0101 07/24/20 0540 07/24/20 0800  BP: (!) 154/87 (!) 144/58 (!) 156/58 138/67  Pulse: (!) 101 98 (!) 104 (!) 102  Resp: 20 20 20 19   Temp: 98 F (36.7 C) 98.2 F (36.8 C) 98.4 F (36.9 C) 98.3 F (36.8 C)  TempSrc: Oral Oral Oral Oral  SpO2: 96% 99% 94%   Weight:      Height:        Intake/Output Summary (Last 24 hours) at 07/24/2020 1645 Last data filed at 07/24/2020 1500 Gross per 24 hour  Intake 121.42 ml  Output 350 ml  Net -228.58 ml   Filed Weights   07/23/20 1607  Weight: 133 kg    Examination: General exam: Alert, awake, oriented x 3; reports dry heaving and significant abdominal pain.  Decreased appetite.  No fever Respiratory system: Good air movement bilaterally; no requiring oxygen supplementation.  No using accessory muscle. Cardiovascular system:RRR. No murmurs, rubs, gallops.  No JVD. Gastrointestinal system: Abdomen is obese, nondistended, soft and vaguely tender to palpation across mid/lower abdomen.  No organomegaly or masses felt. Central nervous system: Alert and oriented. No focal neurological deficits. Extremities: No  cyanosis or clubbing; pedal edema appreciated bilaterally. Skin: No petechiae. Psychiatry: Mood & affect appropriate.    Data Reviewed: I have personally reviewed following labs and imaging studies  CBC: Recent Labs  Lab 07/22/20 1253 07/22/20 1938 07/23/20 0600 07/23/20 1931 07/24/20 0435  WBC  --  10.3 12.0*  --  16.0*  NEUTROABS  --  8.2*  --   --   --   HGB 6.4* 6.2* 8.5* 8.7* 8.9*  HCT 23.2* 22.6* 29.4* 30.5* 31.5*  MCV  --  78.7* 79.7*  --  78.6*  PLT  --  264 296  --  169    Basic Metabolic Panel: Recent Labs  Lab 07/22/20 1938 07/23/20 0600 07/24/20 0435  NA 134* 137 131*  K 4.4 4.0 3.8  CL 98 97* 93*  CO2 28 29 27   GLUCOSE 170* 152* 146*  BUN 15 15 17   CREATININE 1.08 1.13 1.05  CALCIUM 9.0 9.6 9.2    GFR: Estimated Creatinine Clearance: 96.4 mL/min (by C-G formula based on SCr of 1.05 mg/dL).  Liver Function Tests: Recent Labs  Lab 07/22/20 1938  AST 10*  ALT 10  ALKPHOS 62  BILITOT 0.4  PROT 6.8  ALBUMIN 3.6    CBG: Recent Labs  Lab 07/23/20 2306 07/24/20 0024 07/24/20 0357 07/24/20 0805 07/24/20 1200  GLUCAP 187* 156*  168* 127* 147*     Recent Results (from the past 240 hour(s))  Resp Panel by RT PCR (RSV, Flu A&B, Covid) - Nasopharyngeal Swab     Status: None   Collection Time: 07/22/20  5:12 PM   Specimen: Nasopharyngeal Swab  Result Value Ref Range Status   SARS Coronavirus 2 by RT PCR NEGATIVE NEGATIVE Final    Comment: (NOTE) SARS-CoV-2 target nucleic acids are NOT DETECTED.  The SARS-CoV-2 RNA is generally detectable in upper respiratoy specimens during the acute phase of infection. The lowest concentration of SARS-CoV-2 viral copies this assay can detect is 131 copies/mL. A negative result does not preclude SARS-Cov-2 infection and should not be used as the sole basis for treatment or other patient management decisions. A negative result may occur with  improper specimen collection/handling, submission of specimen  other than nasopharyngeal swab, presence of viral mutation(s) within the areas targeted by this assay, and inadequate number of viral copies (<131 copies/mL). A negative result must be combined with clinical observations, patient history, and epidemiological information. The expected result is Negative.  Fact Sheet for Patients:  PinkCheek.be  Fact Sheet for Healthcare Providers:  GravelBags.it  This test is no t yet approved or cleared by the [place] FDA and  has been authorized for detection and/or diagnosis of SARS-CoV-2 by FDA under an Emergency Use Authorization (EUA). This EUA will remain  in effect (meaning this test can be used) for the duration of the COVID-19 declaration under Section 564(b)(1) of the Act, 21 U.S.C. section 360bbb-3(b)(1), unless the authorization is terminated or revoked sooner.     Influenza A by PCR NEGATIVE NEGATIVE Final   Influenza B by PCR NEGATIVE NEGATIVE Final    Comment: (NOTE) The Xpert Xpress SARS-CoV-2/FLU/RSV assay is intended as an aid in  the diagnosis of influenza from Nasopharyngeal swab specimens and  should not be used as a sole basis for treatment. Nasal washings and  aspirates are unacceptable for Xpert Xpress SARS-CoV-2/FLU/RSV  testing.  Fact Sheet for Patients: PinkCheek.be  Fact Sheet for Healthcare Providers: GravelBags.it  This test is not yet approved or cleared by the [place] FDA and  has been authorized for detection and/or diagnosis of SARS-CoV-2 by  FDA under an Emergency Use Authorization (EUA). This EUA will remain  in effect (meaning this test can be used) for the duration of the  Covid-19 declaration under Section 564(b)(1) of the Act, 21  U.S.C. section 360bbb-3(b)(1), unless the authorization is  terminated or revoked.    Respiratory Syncytial Virus by PCR NEGATIVE NEGATIVE Final     Comment: (NOTE) Fact Sheet for Patients: PinkCheek.be  Fact Sheet for Healthcare Providers: GravelBags.it  This test is not yet approved or cleared by the [place] FDA and  has been authorized for detection and/or diagnosis of SARS-CoV-2 by  FDA under an Emergency Use Authorization (EUA). This EUA will remain  in effect (meaning this test can be used) for the duration of the  COVID-19 declaration under Section 564(b)(1) of the Act, 21 U.S.C.  section 360bbb-3(b)(1), unless the authorization is terminated or  revoked. Performed at [facility], [address] [place],  [address]      Radiology Studies: DG Chest 2 View  Result Date: 07/22/2020 CLINICAL DATA:  69 year old male with cough, shortness of breath, congested. COVID-19 status pending. EXAM: CHEST - 2 VIEW COMPARISON:  Chest radiographs 05/15/2014 and earlier. FINDINGS: PA and lateral views. Stable lung volumes and mediastinal contours, within normal limits. Visualized  tracheal air column is within normal limits. No pneumothorax, pulmonary edema, pleural effusion or confluent pulmonary opacity. However, there is mildly increased interstitial opacity at both lung bases. No acute osseous abnormality identified. Negative visible bowel gas pattern. IMPRESSION: Mild increased interstitial opacity at both lung bases suspicious for acute viral/atypical respiratory infection in this setting. Electronically Signed   By: Genevie Ann M.D.   On: 07/22/2020 17:55   CT ABDOMEN PELVIS W CONTRAST  Result Date: 07/23/2020 CLINICAL DATA:  Nausea and abdominal pain since noon today EXAM: CT ABDOMEN AND PELVIS WITH CONTRAST TECHNIQUE: Multidetector CT imaging of the abdomen and pelvis was performed using the standard protocol following bolus administration of intravenous contrast. CONTRAST:  141mL OMNIPAQUE IOHEXOL 300 MG/ML  SOLN COMPARISON:  03/12/2015 FINDINGS: Lower chest: No acute  pleural or parenchymal lung disease. Hepatobiliary: Calcified gallstones are identified without cholecystitis. The liver is unremarkable. No biliary dilation. Pancreas: Unremarkable. No pancreatic ductal dilatation or surrounding inflammatory changes. Spleen: Normal in size without focal abnormality. Adrenals/Urinary Tract: Interval enlargement of the left adrenal myelolipoma, now measuring 3.4 x 3.2 cm. The right adrenal is unremarkable. There is bilateral renal cortical thinning. Stable cyst upper pole left kidney. Otherwise the kidneys enhance normally and symmetrically. No urinary tract calculi or obstructive uropathy. Bladder is decompressed, limiting its evaluation. Stomach/Bowel: There is dilation of the proximal small bowel measuring up to 3.3 cm in diameter. Scattered gas fluid levels are seen. There is abrupt transition within the mid jejunum in the central abdomen, reference image 63 of series 2. This is likely due to adhesion, given the adjacent postsurgical changes from hernia repair. Normal appendix right lower quadrant. Scattered diverticulosis of the distal colon, without diverticulitis. Vascular/Lymphatic: Aortic atherosclerosis. Pathologic adenopathy is seen within the pelvis, with a 17 mm short axis left external iliac lymph node seen on image 80. 18 mm short axis right external iliac node, reference image 82. There are multiple subcentimeter lymph nodes within the retroperitoneum and left para-aortic distribution. Reproductive: Prostate is not enlarged. Other: No free fluid or free gas. Postsurgical changes from previous midline ventral hernia repair. No residual or recurrent hernia. Musculoskeletal: There are no acute or destructive bony lesions. There is bilateral L5 spondylolysis, with grade 2 anterolisthesis of L5 on S1 and severe spondylosis. Reconstructed images demonstrate no additional findings. IMPRESSION: 1. Small-bowel obstruction, transition in the mid jejunum in the central abdomen  as above. 2. Lymphadenopathy within the bilateral external iliac and retroperitoneal distributions as above, of uncertain etiology. 3. Cholelithiasis without cholecystitis. 4. Left adrenal myelolipoma, increased in size since prior study now measuring 3.4 cm. 5. 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 6.  Aortic Atherosclerosis (ICD10-I70.0). Electronically Signed   By: Randa Ngo M.D.   On: 07/23/2020 20:08   ECHOCARDIOGRAM COMPLETE  Result Date: 07/24/2020    ECHOCARDIOGRAM REPORT   Patient Name:   EWEL LONA Date of Exam: 07/24/2020 Medical Rec #:  [id number]     Height:       72.0 in Accession #:    [id number]    Weight:       293.2 lb Date of Birth:  19-Sep-1950     BSA:          2.507 m Patient Age:    20 years      BP:           138/67 mmHg Patient Gender: M  HR:           102 bpm. Exam Location:  [name] Procedure: 2D Echo and Intracardiac Opacification Agent Indications:    Chest Pain 786.50 / R07.9  History:        Patient has prior history of Echocardiogram examinations, most                 recent 09/08/2018. CHF, COPD; Risk Factors:Current Smoker,                 Diabetes, Hypertension and Dyslipidemia.  Sonographer:    Leavy Cella RDCS (AE) Referring Phys: [place]  1. Left ventricular ejection fraction, by estimation, is 55 to 60%. The left ventricle has normal function. The left ventricle has no regional wall motion abnormalities. There is moderate left ventricular hypertrophy. Left ventricular diastolic parameters are indeterminate.  2. Right ventricular systolic function is normal. The right ventricular size is normal.  3. The mitral valve is normal in structure. Mild mitral valve regurgitation. No evidence of mitral stenosis.  4. The aortic valve is tricuspid. There is mild calcification of the aortic valve. There is mild thickening of the aortic valve. Aortic valve regurgitation is not visualized. No aortic stenosis is  present.  5. The inferior vena cava is normal in size with greater than 50% respiratory variability, suggesting right atrial pressure of 3 mmHg. FINDINGS  Left Ventricle: Left ventricular ejection fraction, by estimation, is 55 to 60%. The left ventricle has normal function. The left ventricle has no regional wall motion abnormalities. Definity contrast agent was given IV to delineate the left ventricular  endocardial borders. The left ventricular internal cavity size was normal in size. There is moderate left ventricular hypertrophy. Left ventricular diastolic parameters are indeterminate. Right Ventricle: The right ventricular size is normal. No increase in right ventricular wall thickness. Right ventricular systolic function is normal. Left Atrium: Left atrial size was normal in size. Right Atrium: Right atrial size was normal in size. Pericardium: There is no evidence of pericardial effusion. Mitral Valve: The mitral valve is normal in structure. There is mild thickening of the mitral valve leaflet(s). There is mild calcification of the mitral valve leaflet(s). Mild mitral annular calcification. Mild mitral valve regurgitation. No evidence of  mitral valve stenosis. Tricuspid Valve: The tricuspid valve is normal in structure. Tricuspid valve regurgitation is not demonstrated. No evidence of tricuspid stenosis. Aortic Valve: The aortic valve is tricuspid. There is mild calcification of the aortic valve. There is mild thickening of the aortic valve. There is mild aortic valve annular calcification. Aortic valve regurgitation is not visualized. No aortic stenosis  is present. Aortic valve mean gradient measures 5.5 mmHg. Aortic valve peak gradient measures 10.9 mmHg. Aortic valve area, by VTI measures 2.48 cm. Pulmonic Valve: The pulmonic valve was not well visualized. Pulmonic valve regurgitation is not visualized. No evidence of pulmonic stenosis. Aorta: The aortic root is normal in size and structure. Venous:  The inferior vena cava is normal in size with greater than 50% respiratory variability, suggesting right atrial pressure of 3 mmHg. IAS/Shunts: No atrial level shunt detected by color flow Doppler.  LEFT VENTRICLE PLAX 2D LVIDd:         4.96 cm  Diastology LVIDs:         4.14 cm  LV e' medial:    8.92 cm/s LV PW:         1.50 cm  LV E/e' medial:  14.9 LV IVS:  1.55 cm  LV e' lateral:   13.60 cm/s LVOT diam:     2.00 cm  LV E/e' lateral: 9.8 LV SV:         76 LV SV Index:   30 LVOT Area:     3.14 cm  RIGHT VENTRICLE RV S prime:     16.40 cm/s TAPSE (M-mode): 2.4 cm LEFT ATRIUM             Index       RIGHT ATRIUM           Index LA diam:        4.50 cm 1.80 cm/m  RA Area:     16.90 cm LA Vol (A2C):   61.2 ml 24.41 ml/m RA Volume:   45.90 ml  18.31 ml/m LA Vol (A4C):   60.8 ml 24.25 ml/m LA Biplane Vol: 67.6 ml 26.97 ml/m  AORTIC VALVE AV Area (Vmax):    2.04 cm AV Area (Vmean):   1.97 cm AV Area (VTI):     2.48 cm AV Vmax:           165.30 cm/s AV Vmean:          108.114 cm/s AV VTI:            0.305 m AV Peak Grad:      10.9 mmHg AV Mean Grad:      5.5 mmHg LVOT Vmax:         107.40 cm/s LVOT Vmean:        67.714 cm/s LVOT VTI:          0.241 m LVOT/AV VTI ratio: 0.79  AORTA Ao Root diam: 2.90 cm MITRAL VALVE MV Area (PHT): 5.79 cm     SHUNTS MV Decel Time: 131 msec     Systemic VTI:  0.24 m MR Peak grad: 112.4 mmHg    Systemic Diam: 2.00 cm MR Mean grad: 79.0 mmHg MR Vmax:      530.00 cm/s MR Vmean:     426.0 cm/s MV E velocity: 133.00 cm/s MV A velocity: 129.00 cm/s MV E/A ratio:  1.03 Carlyle Dolly MD Electronically signed by Carlyle Dolly MD Signature Date/Time: 07/24/2020/12:04:52 PM    Final     Scheduled Meds: . aerochamber Z-Stat Plus/medium  1 each Other Once  . azithromycin  500 mg Oral Daily  . folic acid  1 mg Oral QHS  . insulin aspart  0-20 Units Subcutaneous Q4H  . insulin detemir  15 Units Subcutaneous BID  . isosorbide mononitrate  30 mg Oral Daily  . nicotine  21 mg  Transdermal Daily  . pantoprazole (PROTONIX) IV  40 mg Intravenous Q12H  . sodium chloride flush  3 mL Intravenous Q12H   Continuous Infusions: . sodium chloride    . sodium chloride 50 mL/hr at 07/24/20 1500     LOS: 2 days     Barton Dubois, MD [facility]   To contact the attending provider between 7A-7P or the covering provider during after hours 7P-7A, please log into the web site [email or web address] and access using universal [place] password for that web site. If you do not have the password, please call the hospital operator.  07/24/2020, 4:45 PM

## 2020-07-24 NOTE — Care Management Important Message (Signed)
Important Message  Patient Details  Name: Gregory Alvarado MRN: [id number] Date of Birth: 12/31/1950   Medicare Important Message Given:  Yes     [name] 07/24/2020, 12:33 PM

## 2020-07-25 DIAGNOSIS — K921 Melena: Secondary | ICD-10-CM | POA: Diagnosis not present

## 2020-07-25 DIAGNOSIS — K56609 Unspecified intestinal obstruction, unspecified as to partial versus complete obstruction: Secondary | ICD-10-CM

## 2020-07-25 DIAGNOSIS — E538 Deficiency of other specified B group vitamins: Secondary | ICD-10-CM | POA: Diagnosis not present

## 2020-07-25 DIAGNOSIS — D649 Anemia, unspecified: Secondary | ICD-10-CM | POA: Diagnosis not present

## 2020-07-25 LAB — GLUCOSE, CAPILLARY
Glucose-Capillary: 115 mg/dL — ABNORMAL HIGH (ref 70–99)
Glucose-Capillary: 110 mg/dL — ABNORMAL HIGH (ref 70–99)
Glucose-Capillary: 100 mg/dL — ABNORMAL HIGH (ref 70–99)
Glucose-Capillary: 115 mg/dL — ABNORMAL HIGH (ref 70–99)
Glucose-Capillary: 102 mg/dL — ABNORMAL HIGH (ref 70–99)

## 2020-07-25 LAB — BASIC METABOLIC PANEL
Anion gap: 9 (ref 5–15)
BUN: 22 mg/dL (ref 8–23)
CO2: 28 mmol/L (ref 22–32)
Calcium: 9.2 mg/dL (ref 8.9–10.3)
Chloride: 97 mmol/L — ABNORMAL LOW (ref 98–111)
Creatinine, Ser: 1.25 mg/dL — ABNORMAL HIGH (ref 0.61–1.24)
GFR, Estimated: >60 mL/min (ref >60)
Glucose, Bld: 116 mg/dL — ABNORMAL HIGH (ref 70–99)
Potassium: 3.8 mmol/L (ref 3.5–5.1)
Sodium: 134 mmol/L — ABNORMAL LOW (ref 135–145)

## 2020-07-25 LAB — CBC
HCT: 31.1 % — ABNORMAL LOW (ref 39.0–52.0)
Hemoglobin: 8.7 g/dL — ABNORMAL LOW (ref 13.0–17.0)
MCH: 22.4 pg — ABNORMAL LOW (ref 26.0–34.0)
MCHC: 28 g/dL — ABNORMAL LOW (ref 30.0–36.0)
MCV: 80.2 fL (ref 80.0–100.0)
Platelets: 284 10*3/uL (ref 150–400)
RBC: 3.88 MIL/uL — ABNORMAL LOW (ref 4.22–5.81)
RDW: 20.1 % — ABNORMAL HIGH (ref 11.5–15.5)
WBC: 13.9 10*3/uL — ABNORMAL HIGH (ref 4.0–10.5)
nRBC: 0 % (ref 0.0–0.2)

## 2020-07-25 MED ORDER — SODIUM CHLORIDE 0.9 % IV SOLN
500.0000 mg | INTRAVENOUS | Status: AC
  Administered 2020-07-25 – 2020-07-28 (×4): 500 mg via INTRAVENOUS
  Filled 2020-07-25 (×4): qty 500

## 2020-07-25 MED ORDER — SODIUM CHLORIDE 0.9 % IV SOLN: INTRAVENOUS | Status: AC

## 2020-07-25 MED FILL — Insulin Aspart Inj 100 Unit/ML: SUBCUTANEOUS | Qty: 0.03 | Status: AC

## 2020-07-25 NOTE — Progress Notes (Addendum)
Subjective:  Denies nausea. Abdominal pain better controlled with dilaudid. No flatus or BM.   Objective: Vital signs in last 24 hours: Temp:  [98.1 F (36.7 C)-99.4 F (37.4 C)] 98.9 F (37.2 C) (11/11 0452) Pulse Rate:  [97-114] 114 (11/11 0452) Resp:  [18-20] 18 (11/11 0452) BP: (105-124)/(57-64) 111/57 (11/11 0452) SpO2:  [91 %-94 %] 92 % (11/11 0452) Last BM Date: 07/22/20 General:   Alert,  Well-developed, well-nourished, pleasant and cooperative in NAD Head:  Normocephalic and atraumatic. Eyes:  Sclera clear, no icterus.  Chest: CTA bilaterally without rales, rhonchi, crackles.    Heart:  Regular rate and rhythm; no murmurs, clicks, rubs,  or gallops. Abdomen:  Soft, nontender and nondistended. No masses, hepatosplenomegaly or hernias noted. Normal bowel sounds, without guarding, and without rebound.   Extremities:  Without clubbing, deformity or edema. Neurologic:  Alert and  oriented x4;  grossly normal neurologically. Skin:  Intact without significant lesions or rashes. Psych:  Alert and cooperative. Normal mood and affect.  Intake/Output from previous day: 11/10 0701 - 11/11 0700 In: 121.4 [I.V.:121.4] Out: -  Intake/Output this shift: No intake/output data recorded.  Lab Results: CBC Recent Labs    07/23/20 0600 07/23/20 0600 07/23/20 1931 07/24/20 0435 07/25/20 0402  WBC 12.0*  --   --  16.0* 13.9*  HGB 8.5*   < > 8.7* 8.9* 8.7*  HCT 29.4*   < > 30.5* 31.5* 31.1*  MCV 79.7*  --   --  78.6* 80.2  PLT 296  --   --  273 284   < > = values in this interval not displayed.   BMET Recent Labs    07/23/20 0600 07/24/20 0435 07/25/20 0402  NA 137 131* 134*  K 4.0 3.8 3.8  CL 97* 93* 97*  CO2 29 27 28   GLUCOSE 152* 146* 116*  BUN 15 17 22   CREATININE 1.13 1.05 1.25*  CALCIUM 9.6 9.2 9.2   LFTs Recent Labs    07/22/20 1938  BILITOT 0.4  ALKPHOS 62  AST 10*  ALT 10  PROT 6.8  ALBUMIN 3.6   No results for input(s): LIPASE in the last 72  hours. PT/INR No results for input(s): LABPROT, INR in the last 72 hours.    Imaging Studies: DG Chest 2 View  Result Date: 07/22/2020 CLINICAL DATA:  69 year old male with cough, shortness of breath, congested. COVID-19 status pending. EXAM: CHEST - 2 VIEW COMPARISON:  Chest radiographs 05/15/2014 and earlier. FINDINGS: PA and lateral views. Stable lung volumes and mediastinal contours, within normal limits. Visualized tracheal air column is within normal limits. No pneumothorax, pulmonary edema, pleural effusion or confluent pulmonary opacity. However, there is mildly increased interstitial opacity at both lung bases. No acute osseous abnormality identified. Negative visible bowel gas pattern. IMPRESSION: Mild increased interstitial opacity at both lung bases suspicious for acute viral/atypical respiratory infection in this setting. Electronically Signed   By: Genevie Ann M.D.   On: 07/22/2020 17:55   CT ABDOMEN PELVIS W CONTRAST  Result Date: 07/23/2020 CLINICAL DATA:  Nausea and abdominal pain since noon today EXAM: CT ABDOMEN AND PELVIS WITH CONTRAST TECHNIQUE: Multidetector CT imaging of the abdomen and pelvis was performed using the standard protocol following bolus administration of intravenous contrast. CONTRAST:  121mL OMNIPAQUE IOHEXOL 300 MG/ML  SOLN COMPARISON:  03/12/2015 FINDINGS: Lower chest: No acute pleural or parenchymal lung disease. Hepatobiliary: Calcified gallstones are identified without cholecystitis. The liver is unremarkable. No biliary dilation. Pancreas: Unremarkable. No pancreatic ductal  dilatation or surrounding inflammatory changes. Spleen: Normal in size without focal abnormality. Adrenals/Urinary Tract: Interval enlargement of the left adrenal myelolipoma, now measuring 3.4 x 3.2 cm. The right adrenal is unremarkable. There is bilateral renal cortical thinning. Stable cyst upper pole left kidney. Otherwise the kidneys enhance normally and symmetrically. No urinary tract  calculi or obstructive uropathy. Bladder is decompressed, limiting its evaluation. Stomach/Bowel: There is dilation of the proximal small bowel measuring up to 3.3 cm in diameter. Scattered gas fluid levels are seen. There is abrupt transition within the mid jejunum in the central abdomen, reference image 63 of series 2. This is likely due to adhesion, given the adjacent postsurgical changes from hernia repair. Normal appendix right lower quadrant. Scattered diverticulosis of the distal colon, without diverticulitis. Vascular/Lymphatic: Aortic atherosclerosis. Pathologic adenopathy is seen within the pelvis, with a 17 mm short axis left external iliac lymph node seen on image 80. 18 mm short axis right external iliac node, reference image 82. There are multiple subcentimeter lymph nodes within the retroperitoneum and left para-aortic distribution. Reproductive: Prostate is not enlarged. Other: No free fluid or free gas. Postsurgical changes from previous midline ventral hernia repair. No residual or recurrent hernia. Musculoskeletal: There are no acute or destructive bony lesions. There is bilateral L5 spondylolysis, with grade 2 anterolisthesis of L5 on S1 and severe spondylosis. Reconstructed images demonstrate no additional findings. IMPRESSION: 1. Small-bowel obstruction, transition in the mid jejunum in the central abdomen as above. 2. Lymphadenopathy within the bilateral external iliac and retroperitoneal distributions as above, of uncertain etiology. 3. Cholelithiasis without cholecystitis. 4. Left adrenal myelolipoma, increased in size since prior study now measuring 3.4 cm. 5. 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 6.  Aortic Atherosclerosis (ICD10-I70.0). Electronically Signed   By: Randa Ngo M.D.   On: 07/23/2020 20:08   DG ABD ACUTE 2+V W 1V CHEST  Result Date: 07/24/2020 CLINICAL DATA:  Abdominal pain EXAM: DG ABDOMEN ACUTE WITH 1 VIEW CHEST COMPARISON:  CT  07/23/2020 FINDINGS: Minimal left basilar atelectasis. Lungs are otherwise clear. No pneumothorax or pleural effusion. Cardiac size within normal limits. Multiple gas-filled dilated loops of small bowel are seen within the mid abdomen, similar to that noted on prior CT examination. There is relatively little gas within the colon, decreased since prior CT examination. Together, the findings are in keeping with a developing mid to distal small bowel obstruction. No free intraperitoneal gas. No abnormal calcifications within the abdomen. Osseous structures are unremarkable. IMPRESSION: Developing mid to distal small bowel obstruction.  No free air. Electronically Signed   By: Fidela Salisbury MD   On: 07/24/2020 20:03   ECHOCARDIOGRAM COMPLETE  Result Date: 07/24/2020    ECHOCARDIOGRAM REPORT   [name] Date of Exam: 07/24/2020 Medical Rec #:  [id number]     Height:       72.0 in Accession #:    [id number]    Weight:       293.2 lb Date of Birth:  1951/09/05     BSA:          2.507 m Patient Age:    8 years      BP:           138/67 mmHg Patient Gender: M             HR:           102 bpm. Exam Location:  [name] Procedure: 2D Echo and  Intracardiac Opacification Agent Indications:    Chest Pain 786.50 / R07.9  History:        Patient has prior history of Echocardiogram examinations, most                 recent 09/08/2018. CHF, COPD; Risk Factors:Current Smoker,                 Diabetes, Hypertension and Dyslipidemia.  Sonographer:    Leavy Cella RDCS (AE) Referring Phys: [place]  1. Left ventricular ejection fraction, by estimation, is 55 to 60%. The left ventricle has normal function. The left ventricle has no regional wall motion abnormalities. There is moderate left ventricular hypertrophy. Left ventricular diastolic parameters are indeterminate.  2. Right ventricular systolic function is normal. The right ventricular size is normal.  3. The mitral valve is normal  in structure. Mild mitral valve regurgitation. No evidence of mitral stenosis.  4. The aortic valve is tricuspid. There is mild calcification of the aortic valve. There is mild thickening of the aortic valve. Aortic valve regurgitation is not visualized. No aortic stenosis is present.  5. The inferior vena cava is normal in size with greater than 50% respiratory variability, suggesting right atrial pressure of 3 mmHg. FINDINGS  Left Ventricle: Left ventricular ejection fraction, by estimation, is 55 to 60%. The left ventricle has normal function. The left ventricle has no regional wall motion abnormalities. Definity contrast agent was given IV to delineate the left ventricular  endocardial borders. The left ventricular internal cavity size was normal in size. There is moderate left ventricular hypertrophy. Left ventricular diastolic parameters are indeterminate. Right Ventricle: The right ventricular size is normal. No increase in right ventricular wall thickness. Right ventricular systolic function is normal. Left Atrium: Left atrial size was normal in size. Right Atrium: Right atrial size was normal in size. Pericardium: There is no evidence of pericardial effusion. Mitral Valve: The mitral valve is normal in structure. There is mild thickening of the mitral valve leaflet(s). There is mild calcification of the mitral valve leaflet(s). Mild mitral annular calcification. Mild mitral valve regurgitation. No evidence of  mitral valve stenosis. Tricuspid Valve: The tricuspid valve is normal in structure. Tricuspid valve regurgitation is not demonstrated. No evidence of tricuspid stenosis. Aortic Valve: The aortic valve is tricuspid. There is mild calcification of the aortic valve. There is mild thickening of the aortic valve. There is mild aortic valve annular calcification. Aortic valve regurgitation is not visualized. No aortic stenosis  is present. Aortic valve mean gradient measures 5.5 mmHg. Aortic valve peak  gradient measures 10.9 mmHg. Aortic valve area, by VTI measures 2.48 cm. Pulmonic Valve: The pulmonic valve was not well visualized. Pulmonic valve regurgitation is not visualized. No evidence of pulmonic stenosis. Aorta: The aortic root is normal in size and structure. Venous: The inferior vena cava is normal in size with greater than 50% respiratory variability, suggesting right atrial pressure of 3 mmHg. IAS/Shunts: No atrial level shunt detected by color flow Doppler.  LEFT VENTRICLE PLAX 2D LVIDd:         4.96 cm  Diastology LVIDs:         4.14 cm  LV e' medial:    8.92 cm/s LV PW:         1.50 cm  LV E/e' medial:  14.9 LV IVS:        1.55 cm  LV e' lateral:   13.60 cm/s LVOT diam:     2.00 cm  LV E/e' lateral: 9.8 LV SV:         76 LV SV Index:   30 LVOT Area:     3.14 cm  RIGHT VENTRICLE RV S prime:     16.40 cm/s TAPSE (M-mode): 2.4 cm LEFT ATRIUM             Index       RIGHT ATRIUM           Index LA diam:        4.50 cm 1.80 cm/m  RA Area:     16.90 cm LA Vol (A2C):   61.2 ml 24.41 ml/m RA Volume:   45.90 ml  18.31 ml/m LA Vol (A4C):   60.8 ml 24.25 ml/m LA Biplane Vol: 67.6 ml 26.97 ml/m  AORTIC VALVE AV Area (Vmax):    2.04 cm AV Area (Vmean):   1.97 cm AV Area (VTI):     2.48 cm AV Vmax:           165.30 cm/s AV Vmean:          108.114 cm/s AV VTI:            0.305 m AV Peak Grad:      10.9 mmHg AV Mean Grad:      5.5 mmHg LVOT Vmax:         107.40 cm/s LVOT Vmean:        67.714 cm/s LVOT VTI:          0.241 m LVOT/AV VTI ratio: 0.79  AORTA Ao Root diam: 2.90 cm MITRAL VALVE MV Area (PHT): 5.79 cm     SHUNTS MV Decel Time: 131 msec     Systemic VTI:  0.24 m MR Peak grad: 112.4 mmHg    Systemic Diam: 2.00 cm MR Mean grad: 79.0 mmHg MR Vmax:      530.00 cm/s MR Vmean:     426.0 cm/s MV E velocity: 133.00 cm/s MV A velocity: 129.00 cm/s MV E/A ratio:  1.03 Carlyle Dolly MD Electronically signed by Carlyle Dolly MD Signature Date/Time: 07/24/2020/12:04:52 PM    Final   [2  weeks]   Assessment: Pleasant 69 year old male with past medical history of GI bleeding secondary to gastric ulcers (10/2019), COPD, diabetes, chronic kidney disease, hypertension, heart failure, IDA, history of H. pylori in August 2021 presenting to the hospital with dark rust-colored stools and Hgb of 6.4  Acute on chronic anemia with IDA: Patient's hemoglobin noted to be 6.2 down from 8.42 weeks ago.  Ferritin 4.  Folate also low.  He has a history of GI bleeding due to gastric ulcers back in February 2021.  In August EGD was performed that showed previous gastric ulcers completely healed but he had persistent H. pylori (failed twice daily PPI, amoxicillin, Biaxin).  He was retreated with Pepto-Bismol/tetracycline/metronidazole/EPI twice daily. He was recently scheduled for H.pylori breath test to confirm eradication but he had not completed yet. Incomplete colonoscopy (poor prep) August 2021 2 adenomatous colon polyps removed with recommendations for repeat colonoscopy later this year. 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  Normal esophagus and stomach.  After 2 units of packed red blood cells hemoglobin 8.7.  Abdominal pain: Acute onset this admission.  CT the evening of November 9 showed small bowel obstruction, transition in the mid jejunum in the central abdomen. Suspected to be secondary to adhesions from  prior surgeries.  This morning his abdominal pain is improved.  He has had no further nausea or heaving.  General surgery to be consulted in case patient does not progress.  Pelvic lymphadenopathy: Bilateral external iliac and retroperitoneal distributions of uncertain etiology.  Left external iliac lymph node measuring 17 mm.  18 mm short axis right external iliac node.  Multiple subcentimeter lymph nodes noted in the retroperitoneum and left para-aortic  distribution.  Folate deficiency: folate low at 5.4, up from 3.79 months ago.  Patient reports taking 1 mg of folic acid daily.  Plan: 1. NPO.  2. Supportive measures.  3. Given SBO, would avoid capsule endoscopy.  4. Follow of pelvic lymphadenopathy, consider hematology evaluation for this and management of IDA/iron infusions. 5. Continue PPI. Will need to confirm H.pylori eradication at later date when he can hold PPI for 2 weeks.  6. Complete colonoscopy in the near future once patient is agreeable.  Laureen Ochs. Bernarda Caffey [facility] [phone] 11/11/202112:22 PM     LOS: 3 days

## 2020-07-25 NOTE — Progress Notes (Signed)
PROGRESS NOTE    Gregory Alvarado  PNT:[id number] DOB: [birth year] DOA: 07/22/2020 PCP: Celene Squibb, MD    Chief Complaint  Patient presents with  . Shortness of Breath  . low hemoglobin    Brief Narrative:  69 year old gentleman with known prior history of tobacco abuse, COPD, diabetes, hypertension, obesity, hyperlipidemia sent to ED from office with a low hemoglobin.  In addition patient patient reports exertional chest pain. GI and gastroenterology consulted.  Patient is scheduled to undergo EGD later today.  Assessment & Plan:   Principal Problem:   Symptomatic anemia Active Problems:   Essential hypertension   Insulin dependent type 2 diabetes mellitus (HCC)   COPD (chronic obstructive pulmonary disease) (HCC)   Current smoker   Iron deficiency anemia   Melena   GI bleed   Chronic diastolic CHF (congestive heart failure) (HCC)   Guaiac positive stools   Folate deficiency   Acute anemia of blood loss/GI bleed  -Secondary to AVMs -Continue PPI -patient is status post 1 unit PRBC transfusion and IV iron infusion. -Hemoglobin 8.7 -No overt bleeding reported -Continue to follow GI service recommendations.    Nausea, abdominal pain: Secondary to SBO -N.p.o. status -Supportive care and conservative management -General surgery has been consulted as there is some transition point into the jejunum and a prior history of surgical intervention due to diverticulitis in the past; very suggestive for adhesions. -Follow general surgery recommendations; no need for NG tube currently -Continue as needed antiemetics and analgesics-continue IV fluids and follow-up lites -Will allow patient to have ice chips and sips with medications (but minimizing overall meds). -Might benefit of laxative/stool softener challenge  Chronic diastolic heart failure -Appears stable and euvolemic -Will hold HCTZ at this time in the setting of SBO and n.p.o. status.  Exertional chest pain -Minimally  elevated troponins and EKG shows ST and T wave abnormalities in the lateral leads and elevated BNP.  -Appears to be demand ischemia in the setting of anemia -Telemetry and 2D echo reassuring -Cardiology consultation appreciated; no further ischemic work-up anticipated. -Continue risk factor modifications and outpatient follow-up.  Iron deficiency/ microcytic anemia and folate deficiency:  -Status post iron infusion -Continue folic acid supplementation -Patient is status 1 unit of PRBC transfusion during this admission -Hemoglobin stable at 8.9 currently -Follow hemoglobin trend.  Leukocytosis: -Stress demargination and transfusion -CXR shows  Mild increased interstitial opacity at both lung bases suspicious for acute viral/atypical respiratory infection. He is a smoker and has a cough.  Suggesting bronchitis component. -Continue Zithromax.  COPD/ current smoker:  -Cessation counseling has been provided -Good oxygen saturation on room air and no requiring oxygen supplementation -No wheezing on examination. -Continue as needed bronchodilator therapy  Essential Hypertension:  -Currently stable and well-controlled -Continue current antihypertensive agents -Follow vital signs.  Type 2 DM:  CBG (last 3)  Recent Labs    07/25/20 0453 07/25/20 0756 07/25/20 1156  GLUCAP 115* 110* 100*  -Continue to follow CBGs -last A1c is 5.7% -Continue the use of sliding scale insulin and Levemir.   Tobacco abuse: -Cessation counseling has been provided -Continue nicotine patch.   DVT prophylaxis: scd's Code Status: (Full code) Family Communication: No family at bedside; patient expressed that he will update them by himself. Disposition:   Status is: Inpatient  Remains inpatient appropriate because:Ongoing diagnostic testing needed not appropriate for outpatient work up, IV treatments appropriate due to intensity of illness or inability to take PO and Inpatient level of care  appropriate due to  severity of illness   Dispo: The patient is from: Home              Anticipated d/c is to: Home              Anticipated d/c date is: 2 days              Patient currently is not medically stable to d/c.  Patient is complaining of dry heaving/nausea and severe abdominal pain.  Images demonstrating SBO.  Continue n.p.o. status; IV fluids will be provided and continue as needed antiemetics and analgesics.  Will follow general surgery recommendations.  No indication for NG tube at this time.  Continue to follow electrolytes stability.      Consultants:   Cardiology  GI  General surgery  Procedures: egd ECHOCARDIOGRAM: See below for full details.  But in summary preserved ejection fraction, no wall motion abnormalities, no significant valvular disorder.   Antimicrobials: Azithromycin.     Subjective: Reports still having some dry heaving, no bowel movements, some improvement in his abdominal pain.  No vomiting.  Patient is afebrile.  No overt bleeding.  Objective: Vitals:   07/24/20 0800 07/24/20 1840 07/24/20 2009 07/25/20 0452  BP: 138/67 124/64 105/60 (!) 111/57  Pulse: (!) 102 (!) 101 97 (!) 114  Resp: 19 20 20 18   Temp: 98.3 F (36.8 C) 98.1 F (36.7 C) 99.4 F (37.4 C) 98.9 F (37.2 C)  TempSrc: Oral Oral Oral Oral  SpO2:  94% 91% 92%  Weight:      Height:        Intake/Output Summary (Last 24 hours) at 07/25/2020 1224 Last data filed at 07/24/2020 1500 Gross per 24 hour  Intake 121.42 ml  Output --  Net 121.42 ml   Filed Weights   07/23/20 1607  Weight: 133 kg    Examination: General exam: Alert, awake, oriented x 3; reports an improvement in his abdominal pain with current analgesics medications.  Still having some dry heaving but no active vomiting.  No overt bleeding reported.  No shortness of breath, no chest pain, no fever. Respiratory system: Clear to auscultation. Respiratory effort normal.  No requiring oxygen supplementation and  having good O2 sat on room air. Cardiovascular system:RRR. No murmurs, rubs, gallops.  No JVD. Gastrointestinal system: Abdomen is nondistended, soft and nontender. No organomegaly or masses felt. Normal bowel sounds heard. Central nervous system: Alert and oriented. No focal neurological deficits. Extremities: No cyanosis or clubbing.  Trace edema appreciated bilaterally. Skin: No petechiae. Psychiatry: Judgement and insight appear normal. Mood & affect appropriate.    Data Reviewed: I have personally reviewed following labs and imaging studies  CBC: Recent Labs  Lab 07/22/20 1938 07/23/20 0600 07/23/20 1931 07/24/20 0435 07/25/20 0402  WBC 10.3 12.0*  --  16.0* 13.9*  NEUTROABS 8.2*  --   --   --   --   HGB 6.2* 8.5* 8.7* 8.9* 8.7*  HCT 22.6* 29.4* 30.5* 31.5* 31.1*  MCV 78.7* 79.7*  --  78.6* 80.2  PLT 264 296  --  273 465    Basic Metabolic Panel: Recent Labs  Lab 07/22/20 1938 07/23/20 0600 07/24/20 0435 07/25/20 0402  NA 134* 137 131* 134*  K 4.4 4.0 3.8 3.8  CL 98 97* 93* 97*  CO2 28 29 27 28   GLUCOSE 170* 152* 146* 116*  BUN 15 15 17 22   CREATININE 1.08 1.13 1.05 1.25*  CALCIUM 9.0 9.6 9.2 9.2    GFR: Estimated  Creatinine Clearance: 80.9 mL/min (A) (by C-G formula based on SCr of 1.25 mg/dL (H)).  Liver Function Tests: Recent Labs  Lab 07/22/20 1938  AST 10*  ALT 10  ALKPHOS 62  BILITOT 0.4  PROT 6.8  ALBUMIN 3.6    CBG: Recent Labs  Lab 07/24/20 2118 07/24/20 2353 07/25/20 0453 07/25/20 0756 07/25/20 1156  GLUCAP 145* 132* 115* 110* 100*     Recent Results (from the past 240 hour(s))  Resp Panel by RT PCR (RSV, Flu A&B, Covid) - Nasopharyngeal Swab     Status: None   Collection Time: 07/22/20  5:12 PM   Specimen: Nasopharyngeal Swab  Result Value Ref Range Status   SARS Coronavirus 2 by RT PCR NEGATIVE NEGATIVE Final    Comment: (NOTE) SARS-CoV-2 target nucleic acids are NOT DETECTED.  The SARS-CoV-2 RNA is generally detectable  in upper respiratoy specimens during the acute phase of infection. The lowest concentration of SARS-CoV-2 viral copies this assay can detect is 131 copies/mL. A negative result does not preclude SARS-Cov-2 infection and should not be used as the sole basis for treatment or other patient management decisions. A negative result may occur with  improper specimen collection/handling, submission of specimen other than nasopharyngeal swab, presence of viral mutation(s) within the areas targeted by this assay, and inadequate number of viral copies (<131 copies/mL). A negative result must be combined with clinical observations, patient history, and epidemiological information. The expected result is Negative.  Fact Sheet for Patients:  PinkCheek.be  Fact Sheet for Healthcare Providers:  GravelBags.it  This test is no t yet approved or cleared by the [place] FDA and  has been authorized for detection and/or diagnosis of SARS-CoV-2 by FDA under an Emergency Use Authorization (EUA). This EUA will remain  in effect (meaning this test can be used) for the duration of the COVID-19 declaration under Section 564(b)(1) of the Act, 21 U.S.C. section 360bbb-3(b)(1), unless the authorization is terminated or revoked sooner.     Influenza A by PCR NEGATIVE NEGATIVE Final   Influenza B by PCR NEGATIVE NEGATIVE Final    Comment: (NOTE) The Xpert Xpress SARS-CoV-2/FLU/RSV assay is intended as an aid in  the diagnosis of influenza from Nasopharyngeal swab specimens and  should not be used as a sole basis for treatment. Nasal washings and  aspirates are unacceptable for Xpert Xpress SARS-CoV-2/FLU/RSV  testing.  Fact Sheet for Patients: PinkCheek.be  Fact Sheet for Healthcare Providers: GravelBags.it  This test is not yet approved or cleared by the [place] FDA and  has been  authorized for detection and/or diagnosis of SARS-CoV-2 by  FDA under an Emergency Use Authorization (EUA). This EUA will remain  in effect (meaning this test can be used) for the duration of the  Covid-19 declaration under Section 564(b)(1) of the Act, 21  U.S.C. section 360bbb-3(b)(1), unless the authorization is  terminated or revoked.    Respiratory Syncytial Virus by PCR NEGATIVE NEGATIVE Final    Comment: (NOTE) Fact Sheet for Patients: PinkCheek.be  Fact Sheet for Healthcare Providers: GravelBags.it  This test is not yet approved or cleared by the [place] FDA and  has been authorized for detection and/or diagnosis of SARS-CoV-2 by  FDA under an Emergency Use Authorization (EUA). This EUA will remain  in effect (meaning this test can be used) for the duration of the  COVID-19 declaration under Section 564(b)(1) of the Act, 21 U.S.C.  section 360bbb-3(b)(1), unless the authorization is terminated or  revoked. Performed at  [facility], [address] [place], [place] [address]      Radiology Studies: CT ABDOMEN PELVIS W CONTRAST  Result Date: 07/23/2020 CLINICAL DATA:  Nausea and abdominal pain since noon today EXAM: CT ABDOMEN AND PELVIS WITH CONTRAST TECHNIQUE: Multidetector CT imaging of the abdomen and pelvis was performed using the standard protocol following bolus administration of intravenous contrast. CONTRAST:  196mL OMNIPAQUE IOHEXOL 300 MG/ML  SOLN COMPARISON:  03/12/2015 FINDINGS: Lower chest: No acute pleural or parenchymal lung disease. Hepatobiliary: Calcified gallstones are identified without cholecystitis. The liver is unremarkable. No biliary dilation. Pancreas: Unremarkable. No pancreatic ductal dilatation or surrounding inflammatory changes. Spleen: Normal in size without focal abnormality. Adrenals/Urinary Tract: Interval enlargement of the left adrenal myelolipoma, now measuring 3.4 x 3.2 cm. The  right adrenal is unremarkable. There is bilateral renal cortical thinning. Stable cyst upper pole left kidney. Otherwise the kidneys enhance normally and symmetrically. No urinary tract calculi or obstructive uropathy. Bladder is decompressed, limiting its evaluation. Stomach/Bowel: There is dilation of the proximal small bowel measuring up to 3.3 cm in diameter. Scattered gas fluid levels are seen. There is abrupt transition within the mid jejunum in the central abdomen, reference image 63 of series 2. This is likely due to adhesion, given the adjacent postsurgical changes from hernia repair. Normal appendix right lower quadrant. Scattered diverticulosis of the distal colon, without diverticulitis. Vascular/Lymphatic: Aortic atherosclerosis. Pathologic adenopathy is seen within the pelvis, with a 17 mm short axis left external iliac lymph node seen on image 80. 18 mm short axis right external iliac node, reference image 82. There are multiple subcentimeter lymph nodes within the retroperitoneum and left para-aortic distribution. Reproductive: Prostate is not enlarged. Other: No free fluid or free gas. Postsurgical changes from previous midline ventral hernia repair. No residual or recurrent hernia. Musculoskeletal: There are no acute or destructive bony lesions. There is bilateral L5 spondylolysis, with grade 2 anterolisthesis of L5 on S1 and severe spondylosis. Reconstructed images demonstrate no additional findings. IMPRESSION: 1. Small-bowel obstruction, transition in the mid jejunum in the central abdomen as above. 2. Lymphadenopathy within the bilateral external iliac and retroperitoneal distributions as above, of uncertain etiology. 3. Cholelithiasis without cholecystitis. 4. Left adrenal myelolipoma, increased in size since prior study now measuring 3.4 cm. 5. 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 6.  Aortic Atherosclerosis (ICD10-I70.0). Electronically Signed   By: Randa Ngo M.D.   On: 07/23/2020 20:08   DG ABD ACUTE 2+V W 1V CHEST  Result Date: 07/24/2020 CLINICAL DATA:  Abdominal pain EXAM: DG ABDOMEN ACUTE WITH 1 VIEW CHEST COMPARISON:  CT 07/23/2020 FINDINGS: Minimal left basilar atelectasis. Lungs are otherwise clear. No pneumothorax or pleural effusion. Cardiac size within normal limits. Multiple gas-filled dilated loops of small bowel are seen within the mid abdomen, similar to that noted on prior CT examination. There is relatively little gas within the colon, decreased since prior CT examination. Together, the findings are in keeping with a developing mid to distal small bowel obstruction. No free intraperitoneal gas. No abnormal calcifications within the abdomen. Osseous structures are unremarkable. IMPRESSION: Developing mid to distal small bowel obstruction.  No free air. Electronically Signed   By: Fidela Salisbury MD   On: 07/24/2020 20:03   ECHOCARDIOGRAM COMPLETE  Result Date: 07/24/2020    ECHOCARDIOGRAM REPORT   Patient Name:   BROGHAN PANNONE Date of Exam: 07/24/2020 Medical Rec #:  [id number]     Height:  72.0 in Accession #:    [id number]    Weight:       293.2 lb Date of Birth:  04-05-1951     BSA:          2.507 m Patient Age:    49 years      BP:           138/67 mmHg Patient Gender: M             HR:           102 bpm. Exam Location:  [name] Procedure: 2D Echo and Intracardiac Opacification Agent Indications:    Chest Pain 786.50 / R07.9  History:        Patient has prior history of Echocardiogram examinations, most                 recent 09/08/2018. CHF, COPD; Risk Factors:Current Smoker,                 Diabetes, Hypertension and Dyslipidemia.  Sonographer:    Leavy Cella RDCS (AE) Referring Phys: Kappa  1. Left ventricular ejection fraction, by estimation, is 55 to 60%. The left ventricle has normal function. The left ventricle has no regional wall motion abnormalities. There is moderate left ventricular  hypertrophy. Left ventricular diastolic parameters are indeterminate.  2. Right ventricular systolic function is normal. The right ventricular size is normal.  3. The mitral valve is normal in structure. Mild mitral valve regurgitation. No evidence of mitral stenosis.  4. The aortic valve is tricuspid. There is mild calcification of the aortic valve. There is mild thickening of the aortic valve. Aortic valve regurgitation is not visualized. No aortic stenosis is present.  5. The inferior vena cava is normal in size with greater than 50% respiratory variability, suggesting right atrial pressure of 3 mmHg. FINDINGS  Left Ventricle: Left ventricular ejection fraction, by estimation, is 55 to 60%. The left ventricle has normal function. The left ventricle has no regional wall motion abnormalities. Definity contrast agent was given IV to delineate the left ventricular  endocardial borders. The left ventricular internal cavity size was normal in size. There is moderate left ventricular hypertrophy. Left ventricular diastolic parameters are indeterminate. Right Ventricle: The right ventricular size is normal. No increase in right ventricular wall thickness. Right ventricular systolic function is normal. Left Atrium: Left atrial size was normal in size. Right Atrium: Right atrial size was normal in size. Pericardium: There is no evidence of pericardial effusion. Mitral Valve: The mitral valve is normal in structure. There is mild thickening of the mitral valve leaflet(s). There is mild calcification of the mitral valve leaflet(s). Mild mitral annular calcification. Mild mitral valve regurgitation. No evidence of  mitral valve stenosis. Tricuspid Valve: The tricuspid valve is normal in structure. Tricuspid valve regurgitation is not demonstrated. No evidence of tricuspid stenosis. Aortic Valve: The aortic valve is tricuspid. There is mild calcification of the aortic valve. There is mild thickening of the aortic valve. There  is mild aortic valve annular calcification. Aortic valve regurgitation is not visualized. No aortic stenosis  is present. Aortic valve mean gradient measures 5.5 mmHg. Aortic valve peak gradient measures 10.9 mmHg. Aortic valve area, by VTI measures 2.48 cm. Pulmonic Valve: The pulmonic valve was not well visualized. Pulmonic valve regurgitation is not visualized. No evidence of pulmonic stenosis. Aorta: The aortic root is normal in size and structure. Venous: The inferior vena cava is normal in size with greater than  50% respiratory variability, suggesting right atrial pressure of 3 mmHg. IAS/Shunts: No atrial level shunt detected by color flow Doppler.  LEFT VENTRICLE PLAX 2D LVIDd:         4.96 cm  Diastology LVIDs:         4.14 cm  LV e' medial:    8.92 cm/s LV PW:         1.50 cm  LV E/e' medial:  14.9 LV IVS:        1.55 cm  LV e' lateral:   13.60 cm/s LVOT diam:     2.00 cm  LV E/e' lateral: 9.8 LV SV:         76 LV SV Index:   30 LVOT Area:     3.14 cm  RIGHT VENTRICLE RV S prime:     16.40 cm/s TAPSE (M-mode): 2.4 cm LEFT ATRIUM             Index       RIGHT ATRIUM           Index LA diam:        4.50 cm 1.80 cm/m  RA Area:     16.90 cm LA Vol (A2C):   61.2 ml 24.41 ml/m RA Volume:   45.90 ml  18.31 ml/m LA Vol (A4C):   60.8 ml 24.25 ml/m LA Biplane Vol: 67.6 ml 26.97 ml/m  AORTIC VALVE AV Area (Vmax):    2.04 cm AV Area (Vmean):   1.97 cm AV Area (VTI):     2.48 cm AV Vmax:           165.30 cm/s AV Vmean:          108.114 cm/s AV VTI:            0.305 m AV Peak Grad:      10.9 mmHg AV Mean Grad:      5.5 mmHg LVOT Vmax:         107.40 cm/s LVOT Vmean:        67.714 cm/s LVOT VTI:          0.241 m LVOT/AV VTI ratio: 0.79  AORTA Ao Root diam: 2.90 cm MITRAL VALVE MV Area (PHT): 5.79 cm     SHUNTS MV Decel Time: 131 msec     Systemic VTI:  0.24 m MR Peak grad: 112.4 mmHg    Systemic Diam: 2.00 cm MR Mean grad: 79.0 mmHg MR Vmax:      530.00 cm/s MR Vmean:     426.0 cm/s MV E velocity: 133.00 cm/s  MV A velocity: 129.00 cm/s MV E/A ratio:  1.03 Carlyle Dolly MD Electronically signed by Carlyle Dolly MD Signature Date/Time: 07/24/2020/12:04:52 PM    Final     Scheduled Meds: . aerochamber Z-Stat Plus/medium  1 each Other Once  . insulin aspart  0-20 Units Subcutaneous Q4H  . insulin detemir  15 Units Subcutaneous BID  . isosorbide mononitrate  30 mg Oral Daily  . nicotine  21 mg Transdermal Daily  . pantoprazole (PROTONIX) IV  40 mg Intravenous Q12H  . sodium chloride flush  3 mL Intravenous Q12H   Continuous Infusions: . sodium chloride    . sodium chloride 75 mL/hr at 07/25/20 1013  . azithromycin       LOS: 3 days     Barton Dubois, MD [facility]   To contact the attending provider between 7A-7P or the covering provider during after hours 7P-7A, please log into the web site  [email or web address] and access using universal [facility] password for that web site. If you do not have the password, please call the hospital operator.  07/25/2020, 12:24 PM

## 2020-07-26 DIAGNOSIS — D649 Anemia, unspecified: Secondary | ICD-10-CM | POA: Diagnosis not present

## 2020-07-26 LAB — GLUCOSE, CAPILLARY
Glucose-Capillary: 100 mg/dL — ABNORMAL HIGH (ref 70–99)
Glucose-Capillary: 107 mg/dL — ABNORMAL HIGH (ref 70–99)
Glucose-Capillary: 124 mg/dL — ABNORMAL HIGH (ref 70–99)
Glucose-Capillary: 91 mg/dL (ref 70–99)
Glucose-Capillary: 96 mg/dL (ref 70–99)

## 2020-07-26 LAB — BASIC METABOLIC PANEL
Anion gap: 10 (ref 5–15)
BUN: 23 mg/dL (ref 8–23)
CO2: 27 mmol/L (ref 22–32)
Calcium: 8.7 mg/dL — ABNORMAL LOW (ref 8.9–10.3)
Chloride: 99 mmol/L (ref 98–111)
Creatinine, Ser: 1.26 mg/dL — ABNORMAL HIGH (ref 0.61–1.24)
GFR, Estimated: >60 mL/min (ref >60)
Glucose, Bld: 95 mg/dL (ref 70–99)
Potassium: 3.6 mmol/L (ref 3.5–5.1)
Sodium: 136 mmol/L (ref 135–145)

## 2020-07-26 LAB — HEMOGLOBIN AND HEMATOCRIT, BLOOD
HCT: 29.9 % — ABNORMAL LOW (ref 39.0–52.0)
Hemoglobin: 8.3 g/dL — ABNORMAL LOW (ref 13.0–17.0)

## 2020-07-26 LAB — MAGNESIUM: Magnesium: 2.2 mg/dL (ref 1.7–2.4)

## 2020-07-26 MED ORDER — POLYETHYLENE GLYCOL 3350 17 G PO PACK
17.0000 g | PACK | Freq: Once | ORAL | Status: AC
  Administered 2020-07-28: 17 g via ORAL
  Filled 2020-07-26: qty 1

## 2020-07-26 NOTE — Progress Notes (Signed)
Subjective: Feeling ok today. Ome abdominal soreness, but no significant pain. Started passing flatus last night, continues to do so today. Denies N/V. No bowel movement yet and subsequently no bleeding. Hungry and wants to eat. No other overt GI complaints.  Objective: Vital signs in last 24 hours: Temp:  [97.9 F (36.6 C)-98.4 F (36.9 C)] 97.9 F (36.6 C) (11/12 0430) Pulse Rate:  [69-95] 69 (11/12 0430) Resp:  [18] 18 (11/12 0430) BP: (123)/(59-64) 123/64 (11/12 0430) SpO2:  [90 %-98 %] 98 % (11/12 0430) Last BM Date: 07/23/20 General:   Alert and oriented, pleasant Head:  Normocephalic and atraumatic. Eyes:  No icterus, sclera clear. Conjuctiva pink.  Heart:  S1, S2 present, no murmurs noted.  Lungs: Clear to auscultation bilaterally, without wheezing, rales, or rhonchi.  Abdomen:  Bowel sounds present, soft, minimal to mild abdominal soreness with palpation, non-distended. No HSM or hernias noted. No rebound or guarding. Msk:  Symmetrical without gross deformities. Pulses:  Normal bilateral DP pulses noted. Extremities:  Without clubbing or edema. Neurologic:  Alert and  oriented x4;  grossly normal neurologically. Psych:  Alert and cooperative. Normal mood and affect.  Intake/Output from previous day: 11/11 0701 - 11/12 0700 In: 1352.8 [I.V.:1102.8; IV Piggyback:250] Out: -  Intake/Output this shift: No intake/output data recorded.  Lab Results: Recent Labs    07/23/20 1931 07/24/20 0435 07/25/20 0402  WBC  --  16.0* 13.9*  HGB 8.7* 8.9* 8.7*  HCT 30.5* 31.5* 31.1*  PLT  --  273 284   BMET Recent Labs    07/24/20 0435 07/25/20 0402 07/26/20 0607  NA 131* 134* 136  K 3.8 3.8 3.6  CL 93* 97* 99  CO2 27 28 27   GLUCOSE 146* 116* 95  BUN 17 22 23   CREATININE 1.05 1.25* 1.26*  CALCIUM 9.2 9.2 8.7*   LFT No results for input(s): PROT, ALBUMIN, AST, ALT, ALKPHOS, BILITOT, BILIDIR, IBILI in the last 72 hours. PT/INR No results for input(s): LABPROT,  INR in the last 72 hours. Hepatitis Panel No results for input(s): HEPBSAG, HCVAB, HEPAIGM, HEPBIGM in the last 72 hours.   Studies/Results: DG ABD ACUTE 2+V W 1V CHEST  Result Date: 07/24/2020 CLINICAL DATA:  Abdominal pain EXAM: DG ABDOMEN ACUTE WITH 1 VIEW CHEST COMPARISON:  CT 07/23/2020 FINDINGS: Minimal left basilar atelectasis. Lungs are otherwise clear. No pneumothorax or pleural effusion. Cardiac size within normal limits. Multiple gas-filled dilated loops of small bowel are seen within the mid abdomen, similar to that noted on prior CT examination. There is relatively little gas within the colon, decreased since prior CT examination. Together, the findings are in keeping with a developing mid to distal small bowel obstruction. No free intraperitoneal gas. No abnormal calcifications within the abdomen. Osseous structures are unremarkable. IMPRESSION: Developing mid to distal small bowel obstruction.  No free air. Electronically Signed   By: Fidela Salisbury MD   On: 07/24/2020 20:03   ECHOCARDIOGRAM COMPLETE  Result Date: 07/24/2020    ECHOCARDIOGRAM REPORT   [name] Date of Exam: 07/24/2020 Medical Rec #:  [id number]     Height:       72.0 in Accession #:    [id number]    Weight:       293.2 lb Date of Birth:  1950-12-13     BSA:          2.507 m Patient Age:    69 years      BP:  138/67 mmHg Patient Gender: M             HR:           102 bpm. Exam Location:  [name] Procedure: 2D Echo and Intracardiac Opacification Agent Indications:    Chest Pain 786.50 / R07.9  History:        Patient has prior history of Echocardiogram examinations, most                 recent 09/08/2018. CHF, COPD; Risk Factors:Current Smoker,                 Diabetes, Hypertension and Dyslipidemia.  Sonographer:    Leavy Cella RDCS (AE) Referring Phys: [place]  1. Left ventricular ejection fraction, by estimation, is 55 to 60%. The left ventricle has normal  function. The left ventricle has no regional wall motion abnormalities. There is moderate left ventricular hypertrophy. Left ventricular diastolic parameters are indeterminate.  2. Right ventricular systolic function is normal. The right ventricular size is normal.  3. The mitral valve is normal in structure. Mild mitral valve regurgitation. No evidence of mitral stenosis.  4. The aortic valve is tricuspid. There is mild calcification of the aortic valve. There is mild thickening of the aortic valve. Aortic valve regurgitation is not visualized. No aortic stenosis is present.  5. The inferior vena cava is normal in size with greater than 50% respiratory variability, suggesting right atrial pressure of 3 mmHg. FINDINGS  Left Ventricle: Left ventricular ejection fraction, by estimation, is 55 to 60%. The left ventricle has normal function. The left ventricle has no regional wall motion abnormalities. Definity contrast agent was given IV to delineate the left ventricular  endocardial borders. The left ventricular internal cavity size was normal in size. There is moderate left ventricular hypertrophy. Left ventricular diastolic parameters are indeterminate. Right Ventricle: The right ventricular size is normal. No increase in right ventricular wall thickness. Right ventricular systolic function is normal. Left Atrium: Left atrial size was normal in size. Right Atrium: Right atrial size was normal in size. Pericardium: There is no evidence of pericardial effusion. Mitral Valve: The mitral valve is normal in structure. There is mild thickening of the mitral valve leaflet(s). There is mild calcification of the mitral valve leaflet(s). Mild mitral annular calcification. Mild mitral valve regurgitation. No evidence of  mitral valve stenosis. Tricuspid Valve: The tricuspid valve is normal in structure. Tricuspid valve regurgitation is not demonstrated. No evidence of tricuspid stenosis. Aortic Valve: The aortic valve is  tricuspid. There is mild calcification of the aortic valve. There is mild thickening of the aortic valve. There is mild aortic valve annular calcification. Aortic valve regurgitation is not visualized. No aortic stenosis  is present. Aortic valve mean gradient measures 5.5 mmHg. Aortic valve peak gradient measures 10.9 mmHg. Aortic valve area, by VTI measures 2.48 cm. Pulmonic Valve: The pulmonic valve was not well visualized. Pulmonic valve regurgitation is not visualized. No evidence of pulmonic stenosis. Aorta: The aortic root is normal in size and structure. Venous: The inferior vena cava is normal in size with greater than 50% respiratory variability, suggesting right atrial pressure of 3 mmHg. IAS/Shunts: No atrial level shunt detected by color flow Doppler.  LEFT VENTRICLE PLAX 2D LVIDd:         4.96 cm  Diastology LVIDs:         4.14 cm  LV e' medial:    8.92 cm/s LV PW:  1.50 cm  LV E/e' medial:  14.9 LV IVS:        1.55 cm  LV e' lateral:   13.60 cm/s LVOT diam:     2.00 cm  LV E/e' lateral: 9.8 LV SV:         76 LV SV Index:   30 LVOT Area:     3.14 cm  RIGHT VENTRICLE RV S prime:     16.40 cm/s TAPSE (M-mode): 2.4 cm LEFT ATRIUM             Index       RIGHT ATRIUM           Index LA diam:        4.50 cm 1.80 cm/m  RA Area:     16.90 cm LA Vol (A2C):   61.2 ml 24.41 ml/m RA Volume:   45.90 ml  18.31 ml/m LA Vol (A4C):   60.8 ml 24.25 ml/m LA Biplane Vol: 67.6 ml 26.97 ml/m  AORTIC VALVE AV Area (Vmax):    2.04 cm AV Area (Vmean):   1.97 cm AV Area (VTI):     2.48 cm AV Vmax:           165.30 cm/s AV Vmean:          108.114 cm/s AV VTI:            0.305 m AV Peak Grad:      10.9 mmHg AV Mean Grad:      5.5 mmHg LVOT Vmax:         107.40 cm/s LVOT Vmean:        67.714 cm/s LVOT VTI:          0.241 m LVOT/AV VTI ratio: 0.79  AORTA Ao Root diam: 2.90 cm MITRAL VALVE MV Area (PHT): 5.79 cm     SHUNTS MV Decel Time: 131 msec     Systemic VTI:  0.24 m MR Peak grad: 112.4 mmHg    Systemic  Diam: 2.00 cm MR Mean grad: 79.0 mmHg MR Vmax:      530.00 cm/s MR Vmean:     426.0 cm/s MV E velocity: 133.00 cm/s MV A velocity: 129.00 cm/s MV E/A ratio:  1.03 Carlyle Dolly MD Electronically signed by Carlyle Dolly MD Signature Date/Time: 07/24/2020/12:04:52 PM    Final     Assessment: Pleasant 69 year old male with past medical history of GI bleeding secondary to gastric ulcers (10/2019), COPD, diabetes, chronic kidney disease, hypertension, heart failure, IDA, history of H. pylori in August 2021 presenting to the hospital with dark rust-colored stools and Hgb of 6.4  Acute on chronic anemia with IDA: Patient's hemoglobin noted to be 6.2 down from 8.4 two weeks ago.  Ferritin 4.  Folate also low.  He has a history of GI bleeding due to gastric ulcers back in February 2021.  In August EGD was performed that showed previous gastric ulcers completely healed but he had persistent H. pylori (failed twice daily PPI, amoxicillin, Biaxin).  He was retreated with Pepto-Bismol/tetracycline/metronidazole/EPI twice daily. He was recently scheduled for H.pylori breath test to confirm eradication but he had not completed yet. Incomplete colonoscopy (poor prep) August 2021 with two adenomatous colon polyps removed with recommendations for repeat colonoscopy later this year. 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  Normal esophagus and stomach.  After 2 units of packed red blood cells  hemoglobin 8.7. H/H today ordered and 8.3 (stable over the last 3 days).  Abdominal pain: Acute onset this admission.  CT the evening of November 9 showed small bowel obstruction, transition in the mid jejunum in the central abdomen. Suspected to be secondary to adhesions from prior surgeries.  Yesterday his abdominal pain improved and continues to do so today. Now passing flatus, but no BM; has  been NPO.  He has had no further nausea or heaving.  General surgery to be consulted in case patient does not progress.  Pelvic lymphadenopathy: Bilateral external iliac and retroperitoneal distributions of uncertain etiology.  Left external iliac lymph node measuring 17 mm.  18 mm short axis right external iliac node.  Multiple subcentimeter lymph nodes noted in the retroperitoneum and left para-aortic distribution.  Folate deficiency: folate low at 5.4, up from 3.79 months ago.  Patient reports taking 1 mg of folic acid daily.  Plan: 1. Cautious trial of clear liquid diet 2. Avoid capsule for now given SBO; if elected in the future would need patency capsule first 3. Needs outpatient colonoscopy per previous recommendations 4. Outpatient confirmation of H. Pylori eradication 5. Supportive measures 6. Follow for any surgical recommendations 7. Consider gastrografin small bowel follow-thorough if stalled improvement   Thank you for allowing [place] to participate in the care of Gregory Alvarado  [place] Field, DNP, AGNP-C Adult & Gerontological Nurse Practitioner [facility]    LOS: 4 days    07/26/2020, 11:27 AM

## 2020-07-26 NOTE — Progress Notes (Signed)
PROGRESS NOTE    Gregory Alvarado Born  TDD:[id number] DOB: [birth year] DOA: 07/22/2020 PCP: Celene Squibb, MD    Chief Complaint  Patient presents with  . Shortness of Breath  . low hemoglobin    Brief Narrative:  69 year old gentleman with known prior history of tobacco abuse, COPD, diabetes, hypertension, obesity, hyperlipidemia sent to ED from office with a low hemoglobin.  In addition patient patient reports exertional chest pain. GI and gastroenterology consulted.  Patient is scheduled to undergo EGD later today.  Assessment & Plan:   Principal Problem:   Symptomatic anemia Active Problems:   Essential hypertension   Insulin dependent type 2 diabetes mellitus (HCC)   COPD (chronic obstructive pulmonary disease) (HCC)   Current smoker   Iron deficiency anemia   Melena   GI bleed   Chronic diastolic CHF (congestive heart failure) (HCC)   Guaiac positive stools   Folate deficiency   SBO (small bowel obstruction) (HCC)   Acute anemia of blood loss/GI bleed  -Secondary to AVMs -Continue PPI -patient is status post 1 unit PRBC transfusion and IV iron infusion. -Hemoglobin 8.3 -No overt bleeding reported; minimal reduction in hemoglobin level secondary to IV fluids and blood draws. -Continue to follow GI service recommendations.    Nausea, abdominal pain: Secondary to SBO -N.p.o. status -Supportive care and conservative management -Patient with history of diverticulitis surgical intervention in the past and CT scan suggesting transitional point on jejunum with SBO. -Follow general surgery recommendations; no need for NG tube currently -Continue as needed antiemetics and analgesics-continue IV fluids and follow-up lites -Will allow patient to have clear liquid diets. -Passing gas with positive bowel sounds on examination; no guarding or rebound appreciated on abdominal exam. -Continue to follow GI and general surgery recommendation. -Repeat abdominal x-ray in a.m.  Chronic  diastolic heart failure -Appears stable and euvolemic -Will continue holding HCTZ at this time in the setting of SBO and n.p.o. status.  Exertional chest pain -Minimally elevated troponins and EKG shows ST and T wave abnormalities in the lateral leads and elevated BNP.  -Appears to be demand ischemia in the setting of anemia -Telemetry and 2D echo reassuring -Cardiology consultation appreciated; no further ischemic work-up anticipated. -Continue risk factor modifications and outpatient follow-up.  Iron deficiency/ microcytic anemia and folate deficiency:  -Status post iron infusion -Continue folic acid supplementation -Patient is status 1 unit of PRBC transfusion during this admission -Hemoglobin stable at 8.9 currently -Follow hemoglobin trend.  Leukocytosis: -Stress demargination and transfusion -CXR shows  Mild increased interstitial opacity at both lung bases suspicious for acute viral/atypical respiratory infection. He is a smoker and has a cough.  Suggesting bronchitis component. -Continue Zithromax.  COPD/ current smoker:  -Cessation counseling has been provided -Good oxygen saturation on room air and no requiring oxygen supplementation -No wheezing on examination. -Continue as needed bronchodilator therapy  Essential Hypertension:  -Currently stable and well-controlled -Continue current antihypertensive agents -Follow vital signs.  Type 2 DM:  CBG (last 3)  Recent Labs    07/26/20 0432 07/26/20 0805 07/26/20 1144  GLUCAP 107* 91 100*  -Continue to follow CBGs -last A1c is 5.7% -Continue the use of sliding scale insulin and Levemir.   Tobacco abuse: -Cessation counseling has been provided -Continue nicotine patch.   DVT prophylaxis: scd's Code Status: (Full code) Family Communication: No family at bedside; patient expressed that he will update them by himself. Disposition:   Status is: Inpatient  Remains inpatient appropriate because:Ongoing  diagnostic testing needed not appropriate  for outpatient work up, IV treatments appropriate due to intensity of illness or inability to take PO and Inpatient level of care appropriate due to severity of illness   Dispo: The patient is from: Home              Anticipated d/c is to: Home              Anticipated d/c date is: 2 days              Patient currently is not medically stable to d/c.  Patient is complaining of dry heaving/nausea and severe abdominal pain.  Images demonstrating SBO.  Passing gas, no guarding or rebound with improvement in abdominal pain.  Still without bowel movements but no nausea vomiting.  Will advance diet to clear liquids.  Continue to follow GI service and general surgery recommendations.      Consultants:   Cardiology  GI  General surgery  Procedures: egd ECHOCARDIOGRAM: See below for full details.  But in summary preserved ejection fraction, no wall motion abnormalities, no significant valvular disorder.   Antimicrobials: Azithromycin.     Subjective: No nausea vomiting today; reports significant improvement in his abdominal pain and is passing gas.  Still no bowel movements.  No overt bleeding.  Objective: Vitals:   07/25/20 1010 07/25/20 1855 07/25/20 2056 07/26/20 0430  BP:  (!) 123/59  123/64  Pulse: 100 95  69  Resp:  18  18  Temp:  98.4 F (36.9 C)  97.9 F (36.6 C)  TempSrc:  Oral  Oral  SpO2:  94% 90% 98%  Weight:      Height:        Intake/Output Summary (Last 24 hours) at 07/26/2020 1639 Last data filed at 07/26/2020 0300 Gross per 24 hour  Intake 755.51 ml  Output --  Net 755.51 ml   Filed Weights   07/23/20 1607  Weight: 133 kg    Examination: General exam: Alert, awake, oriented x 3; patient reports improvement in abdominal pain; no nausea vomiting currently.  No bowel movements, but passing gas and expressed to be hungry.  No overt bleeding reported. Respiratory system: Clear to auscultation. Respiratory effort  normal.  No using accessory muscles.  Normal O2 sat on room air. Cardiovascular system:RRR. No murmurs, rubs, gallops.  No JVD. Gastrointestinal system: Abdomen is nondistended, soft and with positive bowel sounds.  No guarding or rebound appreciated.  Passing gas. Central nervous system: Alert and oriented. No focal neurological deficits. Extremities: No cyanosis or clubbing. Skin: No rashes, no petechiae. Psychiatry: Judgement and insight appear normal. Mood & affect appropriate.   Data Reviewed: I have personally reviewed following labs and imaging studies  CBC: Recent Labs  Lab 07/22/20 1938 07/22/20 1938 07/23/20 0600 07/23/20 1931 07/24/20 0435 07/25/20 0402 07/26/20 1142  WBC 10.3  --  12.0*  --  16.0* 13.9*  --   NEUTROABS 8.2*  --   --   --   --   --   --   HGB 6.2*   < > 8.5* 8.7* 8.9* 8.7* 8.3*  HCT 22.6*   < > 29.4* 30.5* 31.5* 31.1* 29.9*  MCV 78.7*  --  79.7*  --  78.6* 80.2  --   PLT 264  --  296  --  273 284  --    < > = values in this interval not displayed.    Basic Metabolic Panel: Recent Labs  Lab 07/22/20 1938 07/23/20 0600 07/24/20 0435 07/25/20 0402  07/26/20 0607  NA 134* 137 131* 134* 136  K 4.4 4.0 3.8 3.8 3.6  CL 98 97* 93* 97* 99  CO2 28 29 27 28 27   GLUCOSE 170* 152* 146* 116* 95  BUN 15 15 17 22 23   CREATININE 1.08 1.13 1.05 1.25* 1.26*  CALCIUM 9.0 9.6 9.2 9.2 8.7*  MG  --   --   --   --  2.2    GFR: Estimated Creatinine Clearance: 80.3 mL/min (A) (by C-G formula based on SCr of 1.26 mg/dL (H)).  Liver Function Tests: Recent Labs  Lab 07/22/20 1938  AST 10*  ALT 10  ALKPHOS 62  BILITOT 0.4  PROT 6.8  ALBUMIN 3.6    CBG: Recent Labs  Lab 07/25/20 2025 07/26/20 0006 07/26/20 0432 07/26/20 0805 07/26/20 1144  GLUCAP 102* 96 107* 91 100*     Recent Results (from the past 240 hour(s))  Resp Panel by RT PCR (RSV, Flu A&B, Covid) - Nasopharyngeal Swab     Status: None   Collection Time: 07/22/20  5:12 PM   Specimen:  Nasopharyngeal Swab  Result Value Ref Range Status   SARS Coronavirus 2 by RT PCR NEGATIVE NEGATIVE Final    Comment: (NOTE) SARS-CoV-2 target nucleic acids are NOT DETECTED.  The SARS-CoV-2 RNA is generally detectable in upper respiratoy specimens during the acute phase of infection. The lowest concentration of SARS-CoV-2 viral copies this assay can detect is 131 copies/mL. A negative result does not preclude SARS-Cov-2 infection and should not be used as the sole basis for treatment or other patient management decisions. A negative result may occur with  improper specimen collection/handling, submission of specimen other than nasopharyngeal swab, presence of viral mutation(s) within the areas targeted by this assay, and inadequate number of viral copies (<131 copies/mL). A negative result must be combined with clinical observations, patient history, and epidemiological information. The expected result is Negative.  Fact Sheet for Patients:  PinkCheek.be  Fact Sheet for Healthcare Providers:  GravelBags.it  This test is no t yet approved or cleared by the [place] FDA and  has been authorized for detection and/or diagnosis of SARS-CoV-2 by FDA under an Emergency Use Authorization (EUA). This EUA will remain  in effect (meaning this test can be used) for the duration of the COVID-19 declaration under Section 564(b)(1) of the Act, 21 U.S.C. section 360bbb-3(b)(1), unless the authorization is terminated or revoked sooner.     Influenza A by PCR NEGATIVE NEGATIVE Final   Influenza B by PCR NEGATIVE NEGATIVE Final    Comment: (NOTE) The Xpert Xpress SARS-CoV-2/FLU/RSV assay is intended as an aid in  the diagnosis of influenza from Nasopharyngeal swab specimens and  should not be used as a sole basis for treatment. Nasal washings and  aspirates are unacceptable for Xpert Xpress SARS-CoV-2/FLU/RSV  testing.  Fact Sheet  for Patients: PinkCheek.be  Fact Sheet for Healthcare Providers: GravelBags.it  This test is not yet approved or cleared by the [place] FDA and  has been authorized for detection and/or diagnosis of SARS-CoV-2 by  FDA under an Emergency Use Authorization (EUA). This EUA will remain  in effect (meaning this test can be used) for the duration of the  Covid-19 declaration under Section 564(b)(1) of the Act, 21  U.S.C. section 360bbb-3(b)(1), unless the authorization is  terminated or revoked.    Respiratory Syncytial Virus by PCR NEGATIVE NEGATIVE Final    Comment: (NOTE) Fact Sheet for Patients: PinkCheek.be  Fact Sheet for Healthcare  Providers: GravelBags.it  This test is not yet approved or cleared by the [place] and  has been authorized for detection and/or diagnosis of SARS-CoV-2 by  FDA under an Emergency Use Authorization (EUA). This EUA will remain  in effect (meaning this test can be used) for the duration of the  COVID-19 declaration under Section 564(b)(1) of the Act, 21 U.S.C.  section 360bbb-3(b)(1), unless the authorization is terminated or  revoked. Performed at [facility], [address], [place], [place] [address]      Radiology Studies: DG ABD ACUTE 2+V W 1V CHEST  Result Date: 07/24/2020 CLINICAL DATA:  Abdominal pain EXAM: DG ABDOMEN ACUTE WITH 1 VIEW CHEST COMPARISON:  CT 07/23/2020 FINDINGS: Minimal left basilar atelectasis. Lungs are otherwise clear. No pneumothorax or pleural effusion. Cardiac size within normal limits. Multiple gas-filled dilated loops of small bowel are seen within the mid abdomen, similar to that noted on prior CT examination. There is relatively little gas within the colon, decreased since prior CT examination. Together, the findings are in keeping with a developing mid to distal small bowel obstruction. No  free intraperitoneal gas. No abnormal calcifications within the abdomen. Osseous structures are unremarkable. IMPRESSION: Developing mid to distal small bowel obstruction.  No free air. Electronically Signed   By: Fidela Salisbury MD   On: 07/24/2020 20:03    Scheduled Meds: . aerochamber Z-Stat Plus/medium  1 each Other Once  . insulin aspart  0-20 Units Subcutaneous Q4H  . insulin detemir  15 Units Subcutaneous BID  . isosorbide mononitrate  30 mg Oral Daily  . nicotine  21 mg Transdermal Daily  . pantoprazole (PROTONIX) IV  40 mg Intravenous Q12H  . sodium chloride flush  3 mL Intravenous Q12H   Continuous Infusions: . sodium chloride    . azithromycin 500 mg (07/26/20 1302)     LOS: 4 days     Barton Dubois, MD [facility]   To contact the attending provider between 7A-7P or the covering provider during after hours 7P-7A, please log into the web site [email or web address] and access using universal [place] password for that web site. If you do not have the password, please call the hospital operator.  07/26/2020, 4:39 PM

## 2020-07-26 NOTE — Care Management Important Message (Signed)
Important Message  Patient Details  Name: Gregory Alvarado MRN: [id number] Date of Birth: 28-Aug-1951   Medicare Important Message Given:  Yes     [name] 07/26/2020, 2:22 PM

## 2020-07-26 NOTE — Consult Note (Signed)
Reason for Consult:Small bowel obstruction   Referring Physician: Dr. Mertie Clause Gregory Alvarado is an 69 y.o. male.  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  The patient's only history is that of a ventral herniorrhaphy with mesh in the remote past.  He states he has a bowel movement every 2 to 3 days.  He was admitted to the hospital for further work-up.  Last night, he started passing flatus and denies any significant abdominal pain.  He is hungry and was started on a clear liquid diet.  He denies any nausea.  He states he has never had the diagnosis of a bowel obstruction before.  Past Medical History:  Diagnosis Date  . Anemia   . Arthritis    left shoulder  . COPD (chronic obstructive pulmonary disease) ([place])   . Diabetes mellitus without complication ([place])   . Dyslipidemia   . Hypertension   . Insomnia   . Insomnia   . Morbid obesity ([place])   . Tobacco use     Past Surgical History:  Procedure Laterality Date  . BIOPSY  10/28/2019   Procedure: BIOPSY;  Surgeon: Danie Binder, MD;  [facility];  Service: Endoscopy;;  gastric  . BIOPSY  04/15/2020   Procedure: BIOPSY;  Surgeon: Daneil Dolin, MD;  Location: [facility];  Service: Endoscopy;;  gastric  . COLON RESECTION  1990s?   DIVERTICULITIS  . COLONOSCOPY WITH PROPOFOL N/A 04/15/2020   Rourk: Poor colon prep. two 6 to 8 mm polyps removed from the hepatic flexure, tubular adenomas.  Repeat colonoscopy later this year.  . ESOPHAGOGASTRODUODENOSCOPY (EGD) WITH PROPOFOL N/A 10/28/2019   Dr. Oneida Alar: Multiple cratered gastric ulcers, gastritis/duodenitis.  Path showed H. pylori.  Patient treated with amoxicillin/Biaxin/PPI twice daily.  . ESOPHAGOGASTRODUODENOSCOPY (EGD) WITH PROPOFOL N/A 04/15/2020   Rourk: Patchy gastric erythema status post biopsy to document eradication  of H. pylori.  Biopsy showed persistent H. pylori.  Previously noted gastric ulcer is completely healed.  Marland Kitchen HERNIA REPAIR     UHR  . MALONEY DILATION N/A 04/15/2020   Procedure: Venia Minks DILATION;  Surgeon: Daneil Dolin, MD;  [facility];  Service: Endoscopy;  Laterality: N/A;  . POLYPECTOMY  04/15/2020   Procedure: POLYPECTOMY;  Surgeon: Daneil Dolin, MD;  Location: [facility];  Service: Endoscopy;;  colon  . ROTATOR CUFF REPAIR Bilateral   . TYMPANOPLASTY      Family History  Problem Relation Age of Onset  . Diabetes Mother   . Heart attack Mother   . Heart attack Father   . Heart attack Brother   . Colon cancer Neg Hx   . Stomach cancer Neg Hx     Social History:  reports that he has been smoking cigarettes. He has been smoking about 2.00 packs per day. He has never used smokeless tobacco. He reports previous alcohol use. He reports that he does not use drugs.  Allergies:  Allergies  Allergen Reactions  . Caduet [Amlodipine-Atorvastatin] Swelling    Medications: I have reviewed the patient's current medications.  Results for orders placed or performed during the hospital encounter of 07/22/20 (from the past 48 hour(s))  Glucose, capillary     Status: Abnormal   Collection Time: 07/24/20  9:18 PM  Result Value Ref Range   Glucose-Capillary  145 (H) 70 - 99 mg/dL    Comment: Glucose reference range applies only to samples taken after fasting for at least 8 hours.  Glucose, capillary     Status: Abnormal   Collection Time: 07/24/20 11:53 PM  Result Value Ref Range   Glucose-Capillary 132 (H) 70 - 99 mg/dL    Comment: Glucose reference range applies only to samples taken after fasting for at least 8 hours.   Comment 1 Notify RN    Comment 2 Document in Chart   CBC     Status: Abnormal   Collection Time: 07/25/20  4:02 AM  Result Value Ref Range   WBC 13.9 (H) 4.0 - 10.5 K/uL   RBC 3.88 (L) 4.22 - 5.81 MIL/uL   Hemoglobin 8.7 (L) 13.0 - 17.0 g/dL   HCT  31.1 (L) 39 - 52 %   MCV 80.2 80.0 - 100.0 fL   MCH 22.4 (L) 26.0 - 34.0 pg   MCHC 28.0 (L) 30.0 - 36.0 g/dL   RDW 20.1 (H) 11.5 - 15.5 %   Platelets 284 150 - 400 K/uL   nRBC 0.0 0.0 - 0.2 %    Comment: Performed at [facility], [address], [place], [place] [address]  Basic metabolic panel     Status: Abnormal   Collection Time: 07/25/20  4:02 AM  Result Value Ref Range   Sodium 134 (L) 135 - 145 mmol/L   Potassium 3.8 3.5 - 5.1 mmol/L   Chloride 97 (L) 98 - 111 mmol/L   CO2 28 22 - 32 mmol/L   Glucose, Bld 116 (H) 70 - 99 mg/dL    Comment: Glucose reference range applies only to samples taken after fasting for at least 8 hours.   BUN 22 8 - 23 mg/dL   Creatinine, Ser 1.25 (H) 0.61 - 1.24 mg/dL   Calcium 9.2 8.9 - 10.3 mg/dL   GFR, Estimated >60 >60 mL/min    Comment: (NOTE) Calculated using the CKD-EPI Creatinine Equation (2021)    Anion gap 9 5 - 15    Comment: Performed at [facility], [address], [place], [place] [address]  Glucose, capillary     Status: Abnormal   Collection Time: 07/25/20  4:53 AM  Result Value Ref Range   Glucose-Capillary 115 (H) 70 - 99 mg/dL    Comment: Glucose reference range applies only to samples taken after fasting for at least 8 hours.  Glucose, capillary     Status: Abnormal   Collection Time: 07/25/20  7:56 AM  Result Value Ref Range   Glucose-Capillary 110 (H) 70 - 99 mg/dL    Comment: Glucose reference range applies only to samples taken after fasting for at least 8 hours.  Glucose, capillary     Status: Abnormal   Collection Time: 07/25/20 11:56 AM  Result Value Ref Range   Glucose-Capillary 100 (H) 70 - 99 mg/dL    Comment: Glucose reference range applies only to samples taken after fasting for at least 8 hours.  Glucose, capillary     Status: Abnormal   Collection Time: 07/25/20  5:10 PM  Result Value Ref Range   Glucose-Capillary 115 (H) 70 - 99 mg/dL    Comment: Glucose reference range applies only to samples taken after  fasting for at least 8 hours.  Glucose, capillary     Status: Abnormal   Collection Time: 07/25/20  8:25 PM  Result Value Ref Range   Glucose-Capillary 102 (H) 70 - 99 mg/dL  Comment: Glucose reference range applies only to samples taken after fasting for at least 8 hours.  Glucose, capillary     Status: None   Collection Time: 07/26/20 12:06 AM  Result Value Ref Range   Glucose-Capillary 96 70 - 99 mg/dL    Comment: Glucose reference range applies only to samples taken after fasting for at least 8 hours.  Glucose, capillary     Status: Abnormal   Collection Time: 07/26/20  4:32 AM  Result Value Ref Range   Glucose-Capillary 107 (H) 70 - 99 mg/dL    Comment: Glucose reference range applies only to samples taken after fasting for at least 8 hours.  Basic metabolic panel     Status: Abnormal   Collection Time: 07/26/20  6:07 AM  Result Value Ref Range   Sodium 136 135 - 145 mmol/L   Potassium 3.6 3.5 - 5.1 mmol/L   Chloride 99 98 - 111 mmol/L   CO2 27 22 - 32 mmol/L   Glucose, Bld 95 70 - 99 mg/dL    Comment: Glucose reference range applies only to samples taken after fasting for at least 8 hours.   BUN 23 8 - 23 mg/dL   Creatinine, Ser 1.26 (H) 0.61 - 1.24 mg/dL   Calcium 8.7 (L) 8.9 - 10.3 mg/dL   GFR, Estimated >60 >60 mL/min    Comment: (NOTE) Calculated using the CKD-EPI Creatinine Equation (2021)    Anion gap 10 5 - 15    Comment: Performed at [facility], [address] [place], [place] [address]  Magnesium     Status: None   Collection Time: 07/26/20  6:07 AM  Result Value Ref Range   Magnesium 2.2 1.7 - 2.4 mg/dL    Comment: Performed at [facility], [address], [place], [place] [address]  Glucose, capillary     Status: None   Collection Time: 07/26/20  8:05 AM  Result Value Ref Range   Glucose-Capillary 91 70 - 99 mg/dL    Comment: Glucose reference range applies only to samples taken after fasting for at least 8 hours.  Hemoglobin and hematocrit, blood      Status: Abnormal   Collection Time: 07/26/20 11:42 AM  Result Value Ref Range   Hemoglobin 8.3 (L) 13.0 - 17.0 g/dL   HCT 29.9 (L) 39 - 52 %    Comment: Performed at [facility], [address] [place],  [address]  Glucose, capillary     Status: Abnormal   Collection Time: 07/26/20 11:44 AM  Result Value Ref Range   Glucose-Capillary 100 (H) 70 - 99 mg/dL    Comment: Glucose reference range applies only to samples taken after fasting for at least 8 hours.    DG ABD ACUTE 2+V W 1V CHEST  Result Date: 07/24/2020 CLINICAL DATA:  Abdominal pain EXAM: DG ABDOMEN ACUTE WITH 1 VIEW CHEST COMPARISON:  CT 07/23/2020 FINDINGS: Minimal left basilar atelectasis. Lungs are otherwise clear. No pneumothorax or pleural effusion. Cardiac size within normal limits. Multiple gas-filled dilated loops of small bowel are seen within the mid abdomen, similar to that noted on prior CT examination. There is relatively little gas within the colon, decreased since prior CT examination. Together, the findings are in keeping with a developing mid to distal small bowel obstruction. No free intraperitoneal gas. No abnormal calcifications within the abdomen. Osseous structures are unremarkable. IMPRESSION: Developing mid to distal small bowel obstruction.  No free air. Electronically Signed   By: Fidela Salisbury MD  On: 07/24/2020 20:03    ROS:  Pertinent items are noted in HPI.  Blood pressure 123/64, pulse 69, temperature 97.9 F (36.6 C), temperature source Oral, resp. rate 18, height 6' (1.829 m), weight 133 kg, SpO2 98 %. Physical Exam: Pleasant white male no acute distress Head is normocephalic, atraumatic Lungs clear to auscultation with good breath sounds bilaterally Abdomen is rotund, soft with minimal bowel sounds appreciated.  No rigidity is noted.  CT scan images personally reviewed  Assessment/Plan: Impression: Partial small bowel obstruction versus ileus as his bowel sounds are limited. Plan:  No need for acute surgical invention at this time.  I have advanced him to full liquid diet and gave him MiraLAX.  I told him we would reassess things in the morning.  He understands and agrees.  We will follow with you.  Aviva Signs 07/26/2020, 5:31 PM

## 2020-07-27 ENCOUNTER — Inpatient Hospital Stay (HOSPITAL_COMMUNITY): Payer: Medicare Other

## 2020-07-27 DIAGNOSIS — D649 Anemia, unspecified: Secondary | ICD-10-CM | POA: Diagnosis not present

## 2020-07-27 LAB — CBC
HCT: 29.7 % — ABNORMAL LOW (ref 39.0–52.0)
Hemoglobin: 8.1 g/dL — ABNORMAL LOW (ref 13.0–17.0)
MCH: 21.9 pg — ABNORMAL LOW (ref 26.0–34.0)
MCHC: 27.3 g/dL — ABNORMAL LOW (ref 30.0–36.0)
MCV: 80.3 fL (ref 80.0–100.0)
Platelets: 255 10*3/uL (ref 150–400)
RBC: 3.7 MIL/uL — ABNORMAL LOW (ref 4.22–5.81)
RDW: 19.6 % — ABNORMAL HIGH (ref 11.5–15.5)
WBC: 8.6 10*3/uL (ref 4.0–10.5)
nRBC: 0 % (ref 0.0–0.2)

## 2020-07-27 LAB — BASIC METABOLIC PANEL
Anion gap: 9 (ref 5–15)
BUN: 19 mg/dL (ref 8–23)
CO2: 26 mmol/L (ref 22–32)
Calcium: 8.7 mg/dL — ABNORMAL LOW (ref 8.9–10.3)
Chloride: 98 mmol/L (ref 98–111)
Creatinine, Ser: 1.12 mg/dL (ref 0.61–1.24)
GFR, Estimated: >60 mL/min (ref >60)
Glucose, Bld: 133 mg/dL — ABNORMAL HIGH (ref 70–99)
Potassium: 3.9 mmol/L (ref 3.5–5.1)
Sodium: 133 mmol/L — ABNORMAL LOW (ref 135–145)

## 2020-07-27 LAB — GLUCOSE, CAPILLARY
Glucose-Capillary: 95 mg/dL (ref 70–99)
Glucose-Capillary: 123 mg/dL — ABNORMAL HIGH (ref 70–99)
Glucose-Capillary: 152 mg/dL — ABNORMAL HIGH (ref 70–99)
Glucose-Capillary: 116 mg/dL — ABNORMAL HIGH (ref 70–99)
Glucose-Capillary: 141 mg/dL — ABNORMAL HIGH (ref 70–99)

## 2020-07-27 MED ORDER — INSULIN ASPART 100 UNIT/ML ~~LOC~~ SOLN
0.0000 [IU] | Freq: Three times a day (TID) | SUBCUTANEOUS | Status: DC
  Administered 2020-07-28: 3 [IU] via SUBCUTANEOUS
  Administered 2020-07-28: 4 [IU] via SUBCUTANEOUS
  Administered 2020-07-28 – 2020-07-29 (×2): 3 [IU] via SUBCUTANEOUS
  Administered 2020-07-29 (×2): 4 [IU] via SUBCUTANEOUS
  Administered 2020-07-30: 3 [IU] via SUBCUTANEOUS

## 2020-07-27 NOTE — Progress Notes (Signed)
Subjective: Patient tired this morning. No abdominal pain nausea or vomiting. Tolerating liquid diet without any issues. No bowel movement in previous 24 hours. Does note flatus yesterday.  Objective: Vital signs in last 24 hours: Temp:  [97.9 F (36.6 C)-98.1 F (36.7 C)] 97.9 F (36.6 C) (11/13 0514) Pulse Rate:  [89-90] 89 (11/13 0514) Resp:  [19] 19 (11/13 0514) BP: (138-143)/(49-60) 138/49 (11/13 0514) SpO2:  [86 %-96 %] 96 % (11/13 0514) Last BM Date: 07/23/20 General:   Alert and oriented, pleasant Head:  Normocephalic and atraumatic. Eyes:  No icterus, sclera clear. Conjuctiva pink.  Abdomen:  Bowel sounds present, soft, non-tender, non-distended. No HSM or hernias noted. No rebound or guarding. No masses appreciated  Msk:  Symmetrical without gross deformities. Normal posture. Pulses:  Normal pulses noted. Extremities:  Without clubbing or edema. Neurologic:  Alert and  oriented x4;  grossly normal neurologically. Skin:  Warm and dry, intact without significant lesions.  Cervical Nodes:  No significant cervical adenopathy. Psych:  Alert and cooperative. Normal mood and affect.  Intake/Output from previous day: No intake/output data recorded. Intake/Output this shift: No intake/output data recorded.  Lab Results: Recent Labs    07/25/20 0402 07/26/20 1142 07/27/20 0729  WBC 13.9*  --  8.6  HGB 8.7* 8.3* 8.1*  HCT 31.1* 29.9* 29.7*  PLT 284  --  255   BMET Recent Labs    07/25/20 0402 07/26/20 0607 07/27/20 0729  NA 134* 136 133*  K 3.8 3.6 3.9  CL 97* 99 98  CO2 28 27 26   GLUCOSE 116* 95 133*  BUN 22 23 19   CREATININE 1.25* 1.26* 1.12  CALCIUM 9.2 8.7* 8.7*   LFT No results for input(s): PROT, ALBUMIN, AST, ALT, ALKPHOS, BILITOT, BILIDIR, IBILI in the last 72 hours. PT/INR No results for input(s): LABPROT, INR in the last 72 hours. Hepatitis Panel No results for input(s): HEPBSAG, HCVAB, HEPAIGM, HEPBIGM in the last 72  hours.   Studies/Results: DG Abd 2 Views  Result Date: 07/27/2020 CLINICAL DATA:  69 year old male with abdominal pain, constipation and small-bowel obstruction EXAM: ABDOMEN - 2 VIEW COMPARISON:  Prior x-ray 07/24/2020 FINDINGS: No evidence of free air. The visualized lungs are essentially clear. Small amount of oral contrast material visualized within the cecum and hepatic flexure of the colon. Some gas is present within minimally dilated loops of small bowel in the left hemiabdomen. Overall, the degree of small-bowel distention has significantly improved compared to the prior radiograph. No acute osseous abnormality. IMPRESSION: 1. Significant improvement in the appearance of small-bowel obstruction compared to 07/24/2020. Only a small amount of gas-filled and borderline dilated loops of small bowel are identified in the left hemiabdomen. 2. No evidence of free air. Electronically Signed   By: Jacqulynn Cadet M.D.   On: 07/27/2020 10:49    Assessment: *Small bowel obstruction-partial, improving *Abdominal pain-resolved *Acute on chronic anemia  Plan: Patient improved, tolerating full liquid diet.  Surgery following, appreciate their input.  Can consider capsule endoscopy in the future 1 small bowel obstruction has resolved.  He will need outpatient colonoscopy which we will arrange.  We also need to test him for H. pylori eradication.  Consider Gastrografin small bowel follow-through if he does not improve as expected.  GI to continue to follow.  Elon Alas. Abbey Chatters, D.O. Gastroenterology and Hepatology [facility]   LOS: 5 days    07/27/2020, 11:13 AM

## 2020-07-27 NOTE — Progress Notes (Signed)
4 Days Post-Op  Subjective: Patient denies any abdominal pain, nausea, vomiting.  He has not had a bowel movement over the past 24 hours.  He is feeling movement in his stomach.  No flatus is noted.  Objective: Vital signs in last 24 hours: Temp:  [97.9 F (36.6 C)-98.1 F (36.7 C)] 97.9 F (36.6 C) (11/13 0514) Pulse Rate:  [89-90] 89 (11/13 0514) Resp:  [19] 19 (11/13 0514) BP: (138-143)/(49-60) 138/49 (11/13 0514) SpO2:  [86 %-96 %] 96 % (11/13 0514) Last BM Date: 07/23/20  Intake/Output from previous day: No intake/output data recorded. Intake/Output this shift: No intake/output data recorded.  General appearance: alert, cooperative and no distress GI: soft, non-tender; bowel sounds normal; no masses,  no organomegaly  Lab Results:  Recent Labs    07/25/20 0402 07/25/20 0402 07/26/20 1142 07/27/20 0729  WBC 13.9*  --   --  8.6  HGB 8.7*   < > 8.3* 8.1*  HCT 31.1*   < > 29.9* 29.7*  PLT 284  --   --  255   < > = values in this interval not displayed.   BMET Recent Labs    07/26/20 0607 07/27/20 0729  NA 136 133*  K 3.6 3.9  CL 99 98  CO2 27 26  GLUCOSE 95 133*  BUN 23 19  CREATININE 1.26* 1.12  CALCIUM 8.7* 8.7*   PT/INR No results for input(s): LABPROT, INR in the last 72 hours.  Studies/Results: No results found.  Anti-infectives: Anti-infectives (From admission, onward)   Start     Dose/Rate Route Frequency Ordered Stop   07/25/20 1200  azithromycin (ZITHROMAX) 250 mg in dextrose 5 % 125 mL IVPB  Status:  Discontinued        250 mg 125 mL/hr over 60 Minutes Intravenous Every 24 hours 07/24/20 1706 07/25/20 1056   07/25/20 1200  azithromycin (ZITHROMAX) 500 mg in sodium chloride 0.9 % 250 mL IVPB        500 mg 250 mL/hr over 60 Minutes Intravenous Every 24 hours 07/25/20 1056 07/29/20 1159   07/23/20 1600  azithromycin (ZITHROMAX) tablet 500 mg  Status:  Discontinued        500 mg Oral Daily 07/23/20 1448 07/24/20 1706       Assessment/Plan: Impression: Partial small bowel obstruction.  Clinically seems to be resolving.  Did receive MiraLAX this morning.  Would continue full liquid diet for now.  May advance if bowel function returns.  LOS: 5 days    Aviva Signs 07/27/2020

## 2020-07-27 NOTE — Progress Notes (Signed)
PROGRESS NOTE    Gregory Alvarado  WUJ:[id number] DOB: 1951-07-16 DOA: 07/22/2020 PCP: Celene Squibb, MD    Chief Complaint  Patient presents with   Shortness of Breath   low hemoglobin    Brief Narrative:  69 year old gentleman with known prior history of tobacco abuse, COPD, diabetes, hypertension, obesity, hyperlipidemia sent to ED from office with a low hemoglobin.  In addition patient patient reports exertional chest pain. GI and gastroenterology consulted.  Patient is scheduled to undergo EGD later today.  Assessment & Plan:   Principal Problem:   Symptomatic anemia Active Problems:   Essential hypertension   Insulin dependent type 2 diabetes mellitus (HCC)   COPD (chronic obstructive pulmonary disease) (HCC)   Current smoker   Iron deficiency anemia   Melena   GI bleed   Chronic diastolic CHF (congestive heart failure) (HCC)   Guaiac positive stools   Folate deficiency   SBO (small bowel obstruction) (HCC)   Acute anemia of blood loss/GI bleed  -Secondary to AVMs -Continue PPI -patient is status post 1 unit PRBC transfusion and IV iron infusion. -Hemoglobin 8.3 -No overt bleeding reported; minimal reduction in hemoglobin level secondary to IV fluids and blood draws. -Continue to follow GI service recommendations.    Nausea, abdominal pain: Secondary to SBO -N.p.o. status -Supportive care and conservative management -Patient with history of diverticulitis surgical intervention in the past and CT scan suggesting transitional point on jejunum with SBO. -Follow general surgery recommendations; no need for NG tube currently -Continue as needed antiemetics and analgesics-continue IV fluids and follow-up lites -So far tolerating full liquid diet, passing gas but no bowel movement.  Reports no abdominal pain, no nausea, no vomiting.  -Laxative/stool softener challenge using MiraLAX recommended by general surgery. -Continue to follow GI and general surgery  recommendation.  Chronic diastolic heart failure -Appears stable and euvolemic -Will continue holding HCTZ at this time in the setting of SBO and n.p.o. status.  Exertional chest pain -Minimally elevated troponins and EKG shows ST and T wave abnormalities in the lateral leads and elevated BNP.  -Appears to be demand ischemia in the setting of anemia -Telemetry and 2D echo reassuring -Cardiology consultation appreciated; no further ischemic work-up anticipated. -Continue risk factor modifications and outpatient follow-up.  Iron deficiency/ microcytic anemia and folate deficiency:  -Status post iron infusion -Continue folic acid supplementation -Patient is status 1 unit of PRBC transfusion during this admission -Hemoglobin stable at 8.3 currently -Follow hemoglobin trend.  Leukocytosis: -Stress demargination and transfusion -CXR shows  Mild increased interstitial opacity at both lung bases suspicious for acute viral/atypical respiratory infection. He is a smoker and has a cough.  Suggesting bronchitis component. -Continue Zithromax.  COPD/ current smoker:  -Cessation counseling has been provided -Good oxygen saturation on room air and no requiring oxygen supplementation -No wheezing on examination. -Continue as needed bronchodilator therapy  Essential Hypertension:  -Currently stable and well-controlled -Continue current antihypertensive agents -Follow vital signs.  Type 2 DM:  CBG (last 3)  Recent Labs    07/27/20 0759 07/27/20 1108 07/27/20 1717  GLUCAP 123* 152* 116*  -Continue to follow CBGs -last A1c is 5.7% -Continue the use of sliding scale insulin and Levemir.   Tobacco abuse: -Cessation counseling has been provided -Continue nicotine patch.   DVT prophylaxis: scd's Code Status: (Full code) Family Communication: No family at bedside; patient expressed that he will update them by himself. Disposition:   Status is: Inpatient  Remains inpatient  appropriate because:Ongoing diagnostic testing needed  not appropriate for outpatient work up, IV treatments appropriate due to intensity of illness or inability to take PO and Inpatient level of care appropriate due to severity of illness   Dispo: The patient is from: Home              Anticipated d/c is to: Home              Anticipated d/c date is: 2 days              Patient currently is not medically stable to d/c.  Patient is complaining of dry heaving/nausea and severe abdominal pain.  Images demonstrating SBO.  Passing gas, no guarding or rebound with improvement in abdominal pain.  Still without bowel movements but no nausea vomiting.  Will advance diet to clear liquids.  Continue to follow GI service and general surgery recommendations.      Consultants:   Cardiology  GI  General surgery  Procedures: egd ECHOCARDIOGRAM: See below for full details.  But in summary preserved ejection fraction, no wall motion abnormalities, no significant valvular disorder.   Antimicrobials: Azithromycin.     Subjective: No nausea, no vomiting, no shortness of breath or chest pain.  Patient reports passing gas, still no bowel movements.  Significant improvement in his abdominal discomfort.  Tolerating full liquid diet.  Objective: Vitals:   07/26/20 2024 07/26/20 2030 07/27/20 0514 07/27/20 1403  BP:  (!) 143/60 (!) 138/49 (!) 127/58  Pulse:  90 89 85  Resp:  19 19 19   Temp:  98.1 F (36.7 C) 97.9 F (36.6 C) 97.8 F (36.6 C)  TempSrc:  Oral  Oral  SpO2: (!) 86% 96% 96% 93%  Weight:      Height:        Intake/Output Summary (Last 24 hours) at 07/27/2020 1804 Last data filed at 07/27/2020 0900 Gross per 24 hour  Intake 480 ml  Output --  Net 480 ml   Filed Weights   07/23/20 1607  Weight: 133 kg    Examination: General exam: Alert, awake, oriented x 3; no chest pain, no nausea, no vomiting, no fever.  No overt bleeding described.  Patient reports to tolerate full liquid  diet without problems of far.  Is passing gas, still without bowel movements. Respiratory system: Clear to auscultation.  No crackles, no wheezing, good oxygen saturation on room air. Cardiovascular system:RRR.  No rubs or gallops; no JVD appreciated on exam. Gastrointestinal system: Abdomen is obese, soft and currently nontender to palpation.  Positive bowel sounds heard on examination. Central nervous system: Alert and oriented. No focal neurological deficits. Extremities: No cyanosis or clubbing. Skin: No petechiae. Psychiatry: Judgement and insight appear normal. Mood & affect appropriate.    Data Reviewed: I have personally reviewed following labs and imaging studies  CBC: Recent Labs  Lab 07/22/20 1938 07/22/20 1938 07/23/20 0600 07/23/20 0600 07/23/20 1931 07/24/20 0435 07/25/20 0402 07/26/20 1142 07/27/20 0729  WBC 10.3  --  12.0*  --   --  16.0* 13.9*  --  8.6  NEUTROABS 8.2*  --   --   --   --   --   --   --   --   HGB 6.2*   < > 8.5*   < > 8.7* 8.9* 8.7* 8.3* 8.1*  HCT 22.6*   < > 29.4*   < > 30.5* 31.5* 31.1* 29.9* 29.7*  MCV 78.7*  --  79.7*  --   --  78.6* 80.2  --  80.3  PLT 264  --  296  --   --  273 284  --  255   < > = values in this interval not displayed.    Basic Metabolic Panel: Recent Labs  Lab 07/23/20 0600 07/24/20 0435 07/25/20 0402 07/26/20 0607 07/27/20 0729  NA 137 131* 134* 136 133*  K 4.0 3.8 3.8 3.6 3.9  CL 97* 93* 97* 99 98  CO2 29 27 28 27 26   GLUCOSE 152* 146* 116* 95 133*  BUN 15 17 22 23 19   CREATININE 1.13 1.05 1.25* 1.26* 1.12  CALCIUM 9.6 9.2 9.2 8.7* 8.7*  MG  --   --   --  2.2  --     GFR: Estimated Creatinine Clearance: 90.3 mL/min (by C-G formula based on SCr of 1.12 mg/dL).  Liver Function Tests: Recent Labs  Lab 07/22/20 1938  AST 10*  ALT 10  ALKPHOS 62  BILITOT 0.4  PROT 6.8  ALBUMIN 3.6    CBG: Recent Labs  Lab 07/26/20 2028 07/27/20 0511 07/27/20 0759 07/27/20 1108 07/27/20 1717  GLUCAP 124*  95 123* 152* 116*     Recent Results (from the past 240 hour(s))  Resp Panel by RT PCR (RSV, Flu A&B, Covid) - Nasopharyngeal Swab     Status: None   Collection Time: 07/22/20  5:12 PM   Specimen: Nasopharyngeal Swab  Result Value Ref Range Status   SARS Coronavirus 2 by RT PCR NEGATIVE NEGATIVE Final    Comment: (NOTE) SARS-CoV-2 target nucleic acids are NOT DETECTED.  The SARS-CoV-2 RNA is generally detectable in upper respiratoy specimens during the acute phase of infection. The lowest concentration of SARS-CoV-2 viral copies this assay can detect is 131 copies/mL. A negative result does not preclude SARS-Cov-2 infection and should not be used as the sole basis for treatment or other patient management decisions. A negative result may occur with  improper specimen collection/handling, submission of specimen other than nasopharyngeal swab, presence of viral mutation(s) within the areas targeted by this assay, and inadequate number of viral copies (<131 copies/mL). A negative result must be combined with clinical observations, patient history, and epidemiological information. The expected result is Negative.  Fact Sheet for Patients:  PinkCheek.be  Fact Sheet for Healthcare Providers:  GravelBags.it  This test is no t yet approved or cleared by the [place] FDA and  has been authorized for detection and/or diagnosis of SARS-CoV-2 by FDA under an Emergency Use Authorization (EUA). This EUA will remain  in effect (meaning this test can be used) for the duration of the COVID-19 declaration under Section 564(b)(1) of the Act, 21 U.S.C. section 360bbb-3(b)(1), unless the authorization is terminated or revoked sooner.     Influenza A by PCR NEGATIVE NEGATIVE Final   Influenza B by PCR NEGATIVE NEGATIVE Final    Comment: (NOTE) The Xpert Xpress SARS-CoV-2/FLU/RSV assay is intended as an aid in  the diagnosis of  influenza from Nasopharyngeal swab specimens and  should not be used as a sole basis for treatment. Nasal washings and  aspirates are unacceptable for Xpert Xpress SARS-CoV-2/FLU/RSV  testing.  Fact Sheet for Patients: PinkCheek.be  Fact Sheet for Healthcare Providers: GravelBags.it  This test is not yet approved or cleared by the [place] FDA and  has been authorized for detection and/or diagnosis of SARS-CoV-2 by  FDA under an Emergency Use Authorization (EUA). This EUA will remain  in effect (meaning this test can be used) for the duration of the  Covid-19 declaration under Section 564(b)(1) of the Act, 21  U.S.C. section 360bbb-3(b)(1), unless the authorization is  terminated or revoked.    Respiratory Syncytial Virus by PCR NEGATIVE NEGATIVE Final    Comment: (NOTE) Fact Sheet for Patients: PinkCheek.be  Fact Sheet for Healthcare Providers: GravelBags.it  This test is not yet approved or cleared by the [place] FDA and  has been authorized for detection and/or diagnosis of SARS-CoV-2 by  FDA under an Emergency Use Authorization (EUA). This EUA will remain  in effect (meaning this test can be used) for the duration of the  COVID-19 declaration under Section 564(b)(1) of the Act, 21 U.S.C.  section 360bbb-3(b)(1), unless the authorization is terminated or  revoked. Performed at [facility], [address] [place], [place] [address]      Radiology Studies: DG Abd 2 Views  Result Date: 07/27/2020 CLINICAL DATA:  69 year old male with abdominal pain, constipation and small-bowel obstruction EXAM: ABDOMEN - 2 VIEW COMPARISON:  Prior x-ray 07/24/2020 FINDINGS: No evidence of free air. The visualized lungs are essentially clear. Small amount of oral contrast material visualized within the cecum and hepatic flexure of the colon. Some gas is present within  minimally dilated loops of small bowel in the left hemiabdomen. Overall, the degree of small-bowel distention has significantly improved compared to the prior radiograph. No acute osseous abnormality. IMPRESSION: 1. Significant improvement in the appearance of small-bowel obstruction compared to 07/24/2020. Only a small amount of gas-filled and borderline dilated loops of small bowel are identified in the left hemiabdomen. 2. No evidence of free air. Electronically Signed   By: Jacqulynn Cadet M.D.   On: 07/27/2020 10:49    Scheduled Meds:  aerochamber Z-Stat Plus/medium  1 each Other Once   insulin aspart  0-20 Units Subcutaneous Q4H   insulin detemir  15 Units Subcutaneous BID   isosorbide mononitrate  30 mg Oral Daily   nicotine  21 mg Transdermal Daily   pantoprazole (PROTONIX) IV  40 mg Intravenous Q12H   polyethylene glycol  17 g Oral Once   sodium chloride flush  3 mL Intravenous Q12H   Continuous Infusions:  sodium chloride     azithromycin 500 mg (07/27/20 1344)     LOS: 5 days     Barton Dubois, MD [facility]   To contact the attending provider between 7A-7P or the covering provider during after hours 7P-7A, please log into the web site [email or web address] and access using universal [place] password for that web site. If you do not have the password, please call the hospital operator.  07/27/2020, 6:04 PM

## 2020-07-28 ENCOUNTER — Inpatient Hospital Stay (HOSPITAL_COMMUNITY): Payer: Medicare Other

## 2020-07-28 DIAGNOSIS — D649 Anemia, unspecified: Secondary | ICD-10-CM | POA: Diagnosis not present

## 2020-07-28 LAB — GLUCOSE, CAPILLARY
Glucose-Capillary: 125 mg/dL — ABNORMAL HIGH (ref 70–99)
Glucose-Capillary: 141 mg/dL — ABNORMAL HIGH (ref 70–99)
Glucose-Capillary: 176 mg/dL — ABNORMAL HIGH (ref 70–99)
Glucose-Capillary: 141 mg/dL — ABNORMAL HIGH (ref 70–99)

## 2020-07-28 MED ORDER — DIATRIZOATE MEGLUMINE & SODIUM 66-10 % PO SOLN
ORAL | Status: AC
  Filled 2020-07-28: qty 90

## 2020-07-28 MED ORDER — DIATRIZOATE MEGLUMINE & SODIUM 66-10 % PO SOLN
90.0000 mL | Freq: Once | ORAL | Status: AC
  Administered 2020-07-28: 90 mL via ORAL
  Filled 2020-07-28: qty 90

## 2020-07-28 NOTE — Progress Notes (Signed)
5 Days Post-Op  Subjective: Still has not had a bowel movement yet.  He does feel movement in his abdomen.  Tolerating full liquids well.  Objective: Vital signs in last 24 hours: Temp:  [97.8 F (36.6 C)-98.5 F (36.9 C)] 98.4 F (36.9 C) (11/14 0546) Pulse Rate:  [81-93] 93 (11/14 0546) Resp:  [19] 19 (11/14 0546) BP: (127-155)/(55-83) 155/83 (11/14 0546) SpO2:  [93 %-98 %] 98 % (11/14 0546) Last BM Date: 07/23/20  Intake/Output from previous day: 11/13 0701 - 11/14 0700 In: 1440 [P.O.:1440] Out: -  Intake/Output this shift: No intake/output data recorded.  General appearance: alert, cooperative and no distress GI: Soft, nontender.  Difficult to assess distention due to body habitus.  Minimal bowel sounds appreciated.  Lab Results:  Recent Labs    07/26/20 1142 07/27/20 0729  WBC  --  8.6  HGB 8.3* 8.1*  HCT 29.9* 29.7*  PLT  --  255   BMET Recent Labs    07/26/20 0607 07/27/20 0729  NA 136 133*  K 3.6 3.9  CL 99 98  CO2 27 26  GLUCOSE 95 133*  BUN 23 19  CREATININE 1.26* 1.12  CALCIUM 8.7* 8.7*   PT/INR No results for input(s): LABPROT, INR in the last 72 hours.  Studies/Results: DG Abd 2 Views  Result Date: 07/27/2020 CLINICAL DATA:  69 year old male with abdominal pain, constipation and small-bowel obstruction EXAM: ABDOMEN - 2 VIEW COMPARISON:  Prior x-ray 07/24/2020 FINDINGS: No evidence of free air. The visualized lungs are essentially clear. Small amount of oral contrast material visualized within the cecum and hepatic flexure of the colon. Some gas is present within minimally dilated loops of small bowel in the left hemiabdomen. Overall, the degree of small-bowel distention has significantly improved compared to the prior radiograph. No acute osseous abnormality. IMPRESSION: 1. Significant improvement in the appearance of small-bowel obstruction compared to 07/24/2020. Only a small amount of gas-filled and borderline dilated loops of small bowel are  identified in the left hemiabdomen. 2. No evidence of free air. Electronically Signed   By: Jacqulynn Cadet M.D.   On: 07/27/2020 10:49    Anti-infectives: Anti-infectives (From admission, onward)   Start     Dose/Rate Route Frequency Ordered Stop   07/25/20 1200  azithromycin (ZITHROMAX) 250 mg in dextrose 5 % 125 mL IVPB  Status:  Discontinued        250 mg 125 mL/hr over 60 Minutes Intravenous Every 24 hours 07/24/20 1706 07/25/20 1056   07/25/20 1200  azithromycin (ZITHROMAX) 500 mg in sodium chloride 0.9 % 250 mL IVPB        500 mg 250 mL/hr over 60 Minutes Intravenous Every 24 hours 07/25/20 1056 07/29/20 1159   07/23/20 1600  azithromycin (ZITHROMAX) tablet 500 mg  Status:  Discontinued        500 mg Oral Daily 07/23/20 1448 07/24/20 1706      Assessment/Plan: s/p Procedure(s): ESOPHAGOGASTRODUODENOSCOPY (EGD) WITH PROPOFOL Impression: Ongoing small bowel obstruction versus ileus.  Will get small bowel follow-through series today to assess.  Further management is pending those results.  LOS: 6 days    Aviva Signs 07/28/2020

## 2020-07-28 NOTE — Progress Notes (Signed)
Subjective: Tolerating full liquid diet.  Occasional pain.  No bowel movement yet.  Does note flatus.  Objective: Vital signs in last 24 hours: Temp:  [97.8 F (36.6 C)-98.5 F (36.9 C)] 98.4 F (36.9 C) (11/14 0546) Pulse Rate:  [81-93] 93 (11/14 0546) Resp:  [19] 19 (11/14 0546) BP: (127-155)/(55-83) 155/83 (11/14 0546) SpO2:  [93 %-98 %] 98 % (11/14 0546) Last BM Date: 07/23/20 General:   Alert and oriented, pleasant Head:  Normocephalic and atraumatic. Eyes:  No icterus, sclera clear. Conjuctiva pink.  Mouth:  Without lesions, mucosa pink and moist.  Neck:  Supple, without thyromegaly or masses.  Heart:  S1, S2 present, no murmurs noted.  Lungs: Clear to auscultation bilaterally, without wheezing, rales, or rhonchi.  Abdomen:  Bowel sounds present, soft, non-tender, non-distended. No HSM or hernias noted. No rebound or guarding. No masses appreciated  Msk:  Symmetrical without gross deformities. Normal posture. Pulses:  Normal pulses noted. Extremities:  Without clubbing or edema. Neurologic:  Alert and  oriented x4;  grossly normal neurologically. Skin:  Warm and dry, intact without significant lesions.  Cervical Nodes:  No significant cervical adenopathy. Psych:  Alert and cooperative. Normal mood and affect.  Intake/Output from previous day: 11/13 0701 - 11/14 0700 In: 1440 [P.O.:1440] Out: -  Intake/Output this shift: No intake/output data recorded.  Lab Results: Recent Labs    07/26/20 1142 07/27/20 0729  WBC  --  8.6  HGB 8.3* 8.1*  HCT 29.9* 29.7*  PLT  --  255   BMET Recent Labs    07/26/20 0607 07/27/20 0729  NA 136 133*  K 3.6 3.9  CL 99 98  CO2 27 26  GLUCOSE 95 133*  BUN 23 19  CREATININE 1.26* 1.12  CALCIUM 8.7* 8.7*   LFT No results for input(s): PROT, ALBUMIN, AST, ALT, ALKPHOS, BILITOT, BILIDIR, IBILI in the last 72 hours. PT/INR No results for input(s): LABPROT, INR in the last 72 hours. Hepatitis Panel No results for input(s):  HEPBSAG, HCVAB, HEPAIGM, HEPBIGM in the last 72 hours.   Studies/Results: DG Abd 2 Views  Result Date: 07/27/2020 CLINICAL DATA:  69 year old male with abdominal pain, constipation and small-bowel obstruction EXAM: ABDOMEN - 2 VIEW COMPARISON:  Prior x-ray 07/24/2020 FINDINGS: No evidence of free air. The visualized lungs are essentially clear. Small amount of oral contrast material visualized within the cecum and hepatic flexure of the colon. Some gas is present within minimally dilated loops of small bowel in the left hemiabdomen. Overall, the degree of small-bowel distention has significantly improved compared to the prior radiograph. No acute osseous abnormality. IMPRESSION: 1. Significant improvement in the appearance of small-bowel obstruction compared to 07/24/2020. Only a small amount of gas-filled and borderline dilated loops of small bowel are identified in the left hemiabdomen. 2. No evidence of free air. Electronically Signed   By: Jacqulynn Cadet M.D.   On: 07/27/2020 10:49    Assessment: *Small bowel obstruction-partial, improving *Abdominal pain-improved *Acute on chronic anemia  Plan: Patient improved, tolerating full liquid diet.  Surgery following, appreciate their input.  Can consider capsule endoscopy in the future once small bowel obstruction has resolved.  He will need outpatient colonoscopy which we will arrange.  We also need to test him for H. pylori eradication.  Gastrografin small bowel follow-through today as patient has not had a bowel movement yet.  GI to continue to follow.  Elon Alas. Abbey Chatters, D.O. Gastroenterology and Hepatology [facility]  LOS: 6 days  07/28/2020, 12:10 PM

## 2020-07-28 NOTE — Progress Notes (Signed)
PROGRESS NOTE    Gregory Alvarado  EZM:[id number] DOB: [birth year] DOA: 07/22/2020 PCP: Celene Squibb, MD    Chief Complaint  Patient presents with  . Shortness of Breath  . low hemoglobin    Brief Narrative:  69 year old gentleman with known prior history of tobacco abuse, COPD, diabetes, hypertension, obesity, hyperlipidemia sent to ED from office with a low hemoglobin.  In addition patient patient reports exertional chest pain. GI and gastroenterology consulted.  Patient is scheduled to undergo EGD later today.  Assessment & Plan:   Principal Problem:   Symptomatic anemia Active Problems:   Essential hypertension   Insulin dependent type 2 diabetes mellitus (HCC)   COPD (chronic obstructive pulmonary disease) (HCC)   Current smoker   Iron deficiency anemia   Melena   GI bleed   Chronic diastolic CHF (congestive heart failure) (HCC)   Guaiac positive stools   Folate deficiency   SBO (small bowel obstruction) (HCC)   Acute anemia of blood loss/GI bleed  -Secondary to AVMs -Continue PPI -patient is status post 1 unit PRBC transfusion and IV iron infusion. -Hemoglobin 8.3 -No overt bleeding reported; minimal reduction in hemoglobin level secondary to IV fluids and blood draws. -Continue to follow GI service recommendations.    Nausea, abdominal pain: Secondary to SBO -N.p.o. status -Supportive care and conservative management -Patient with history of diverticulitis surgical intervention in the past and CT scan suggesting transitional point on jejunum with SBO. -Follow general surgery recommendations; no need for NG tube currently -Continue as needed antiemetics and analgesics-continue IV fluids and follow-up lites -So far tolerating full liquid diet, passing gas but no bowel movement.  Reports no abdominal pain, no nausea, no vomiting.  -Laxative/stool softener challenge using MiraLAX recommended by general surgery. -Continue to follow GI and general surgery  recommendation.  Chronic diastolic heart failure -Appears stable and euvolemic -Will continue holding HCTZ at this time in the setting of SBO and n.p.o. status.  Exertional chest pain -Minimally elevated troponins and EKG shows ST and T wave abnormalities in the lateral leads and elevated BNP.  -Appears to be demand ischemia in the setting of anemia -Telemetry and 2D echo reassuring -Cardiology consultation appreciated; no further ischemic work-up anticipated. -Continue risk factor modifications and outpatient follow-up.  Iron deficiency/ microcytic anemia and folate deficiency:  -Status post iron infusion -Continue folic acid supplementation -Patient is status 1 unit of PRBC transfusion during this admission -Hemoglobin stable at 8.3 currently -Follow hemoglobin trend.  Leukocytosis: -Stress demargination and transfusion -CXR shows  Mild increased interstitial opacity at both lung bases suspicious for acute viral/atypical respiratory infection. He is a smoker and has a cough.  Suggesting bronchitis component. -Continue Zithromax.  COPD/ current smoker:  -Cessation counseling has been provided -Good oxygen saturation on room air and no requiring oxygen supplementation -No wheezing on examination. -Continue as needed bronchodilator therapy  Essential Hypertension:  -Currently stable and well-controlled -Continue current antihypertensive agents -Follow vital signs.  Type 2 DM:  CBG (last 3)  Recent Labs    07/27/20 2127 07/28/20 0743 07/28/20 1105  GLUCAP 141* 125* 141*  -Continue to follow CBGs -last A1c is 5.7% -Continue the use of sliding scale insulin and Levemir.   Tobacco abuse: -Cessation counseling has been provided -Continue nicotine patch.   DVT prophylaxis: scd's Code Status: (Full code) Family Communication: No family at bedside; patient expressed that he will update them by himself. Disposition:   Status is: Inpatient  Remains inpatient  appropriate because:Ongoing diagnostic testing needed  not appropriate for outpatient work up, IV treatments appropriate due to intensity of illness or inability to take PO and Inpatient level of care appropriate due to severity of illness   Dispo: The patient is from: Home              Anticipated d/c is to: Home              Anticipated d/c date is: 2 days              Patient currently is not medically stable to d/c.  Patient is complaining of dry heaving/nausea and severe abdominal pain.  Images demonstrating SBO.  Passing gas, no guarding or rebound with improvement in abdominal pain.  Still without bowel movements but no nausea vomiting.  Continue full liquid diet; Gastrografin small bowel follow-through study today.  Continue to follow GI service and general surgery recommendations.      Consultants:   Cardiology  GI  General surgery  Procedures: egd ECHOCARDIOGRAM: See below for full details.  But in summary preserved ejection fraction, no wall motion abnormalities, no significant valvular disorder.   Antimicrobials: Azithromycin.     Subjective: No nausea, no vomiting, no shortness of breath.  Patient denies chest pain.  Good oxygen saturation on room air.  No bowel movements.  Reports passing gas and is having positive bowel sounds.  Objective: Vitals:   07/27/20 0514 07/27/20 1403 07/27/20 2125 07/28/20 0546  BP: (!) 138/49 (!) 127/58 (!) 134/55 (!) 155/83  Pulse: 89 85 81 93  Resp: 19 19 19 19   Temp: 97.9 F (36.6 C) 97.8 F (36.6 C) 98.5 F (36.9 C) 98.4 F (36.9 C)  TempSrc:  Oral  Oral  SpO2: 96% 93% 95% 98%  Weight:      Height:        Intake/Output Summary (Last 24 hours) at 07/28/2020 1257 Last data filed at 07/27/2020 1700 Gross per 24 hour  Intake 960 ml  Output --  Net 960 ml   Filed Weights   07/23/20 1607  Weight: 133 kg    Examination: General exam: Alert, awake, oriented x 3; tolerating full liquid diet; no abdominal pain, no vomiting.   Reports passing gas but no bowel movement.   Respiratory system: Clear to auscultation. Respiratory effort normal. Cardiovascular system:RRR. No rubs or gallops.  No JVD. Gastrointestinal system: Abdomen is obese, nontender to palpation; positive bowel sounds appreciated.   Central nervous system: Alert and oriented. No focal neurological deficits. Extremities: No cyanosis or clubbing. Skin: No petechiae. Psychiatry: Judgement and insight appear normal. Mood & affect appropriate.    Data Reviewed: I have personally reviewed following labs and imaging studies  CBC: Recent Labs  Lab 07/22/20 1938 07/22/20 1938 07/23/20 0600 07/23/20 0600 07/23/20 1931 07/24/20 0435 07/25/20 0402 07/26/20 1142 07/27/20 0729  WBC 10.3  --  12.0*  --   --  16.0* 13.9*  --  8.6  NEUTROABS 8.2*  --   --   --   --   --   --   --   --   HGB 6.2*   < > 8.5*   < > 8.7* 8.9* 8.7* 8.3* 8.1*  HCT 22.6*   < > 29.4*   < > 30.5* 31.5* 31.1* 29.9* 29.7*  MCV 78.7*  --  79.7*  --   --  78.6* 80.2  --  80.3  PLT 264  --  296  --   --  273 284  --  255   < > = values in this interval not displayed.    Basic Metabolic Panel: Recent Labs  Lab 07/23/20 0600 07/24/20 0435 07/25/20 0402 07/26/20 0607 07/27/20 0729  NA 137 131* 134* 136 133*  K 4.0 3.8 3.8 3.6 3.9  CL 97* 93* 97* 99 98  CO2 29 27 28 27 26   GLUCOSE 152* 146* 116* 95 133*  BUN 15 17 22 23 19   CREATININE 1.13 1.05 1.25* 1.26* 1.12  CALCIUM 9.6 9.2 9.2 8.7* 8.7*  MG  --   --   --  2.2  --     GFR: Estimated Creatinine Clearance: 90.3 mL/min (by C-G formula based on SCr of 1.12 mg/dL).  Liver Function Tests: Recent Labs  Lab 07/22/20 1938  AST 10*  ALT 10  ALKPHOS 62  BILITOT 0.4  PROT 6.8  ALBUMIN 3.6    CBG: Recent Labs  Lab 07/27/20 1108 07/27/20 1717 07/27/20 2127 07/28/20 0743 07/28/20 1105  GLUCAP 152* 116* 141* 125* 141*     Recent Results (from the past 240 hour(s))  Resp Panel by RT PCR (RSV, Flu A&B, Covid) -  Nasopharyngeal Swab     Status: None   Collection Time: 07/22/20  5:12 PM   Specimen: Nasopharyngeal Swab  Result Value Ref Range Status   SARS Coronavirus 2 by RT PCR NEGATIVE NEGATIVE Final    Comment: (NOTE) SARS-CoV-2 target nucleic acids are NOT DETECTED.  The SARS-CoV-2 RNA is generally detectable in upper respiratoy specimens during the acute phase of infection. The lowest concentration of SARS-CoV-2 viral copies this assay can detect is 131 copies/mL. A negative result does not preclude SARS-Cov-2 infection and should not be used as the sole basis for treatment or other patient management decisions. A negative result may occur with  improper specimen collection/handling, submission of specimen other than nasopharyngeal swab, presence of viral mutation(s) within the areas targeted by this assay, and inadequate number of viral copies (<131 copies/mL). A negative result must be combined with clinical observations, patient history, and epidemiological information. The expected result is Negative.  Fact Sheet for Patients:  PinkCheek.be  Fact Sheet for Healthcare Providers:  GravelBags.it  This test is no t yet approved or cleared by the [place] FDA and  has been authorized for detection and/or diagnosis of SARS-CoV-2 by FDA under an Emergency Use Authorization (EUA). This EUA will remain  in effect (meaning this test can be used) for the duration of the COVID-19 declaration under Section 564(b)(1) of the Act, 21 U.S.C. section 360bbb-3(b)(1), unless the authorization is terminated or revoked sooner.     Influenza A by PCR NEGATIVE NEGATIVE Final   Influenza B by PCR NEGATIVE NEGATIVE Final    Comment: (NOTE) The Xpert Xpress SARS-CoV-2/FLU/RSV assay is intended as an aid in  the diagnosis of influenza from Nasopharyngeal swab specimens and  should not be used as a sole basis for treatment. Nasal washings and   aspirates are unacceptable for Xpert Xpress SARS-CoV-2/FLU/RSV  testing.  Fact Sheet for Patients: PinkCheek.be  Fact Sheet for Healthcare Providers: GravelBags.it  This test is not yet approved or cleared by the [place] FDA and  has been authorized for detection and/or diagnosis of SARS-CoV-2 by  FDA under an Emergency Use Authorization (EUA). This EUA will remain  in effect (meaning this test can be used) for the duration of the  Covid-19 declaration under Section 564(b)(1) of the Act, 21  U.S.C. section 360bbb-3(b)(1), unless the authorization is  terminated  or revoked.    Respiratory Syncytial Virus by PCR NEGATIVE NEGATIVE Final    Comment: (NOTE) Fact Sheet for Patients: PinkCheek.be  Fact Sheet for Healthcare Providers: GravelBags.it  This test is not yet approved or cleared by the [place] FDA and  has been authorized for detection and/or diagnosis of SARS-CoV-2 by  FDA under an Emergency Use Authorization (EUA). This EUA will remain  in effect (meaning this test can be used) for the duration of the  COVID-19 declaration under Section 564(b)(1) of the Act, 21 U.S.C.  section 360bbb-3(b)(1), unless the authorization is terminated or  revoked. Performed at [facility], [address] [place], [place] [address]      Radiology Studies: DG Abd 2 Views  Result Date: 07/27/2020 CLINICAL DATA:  69 year old male with abdominal pain, constipation and small-bowel obstruction EXAM: ABDOMEN - 2 VIEW COMPARISON:  Prior x-ray 07/24/2020 FINDINGS: No evidence of free air. The visualized lungs are essentially clear. Small amount of oral contrast material visualized within the cecum and hepatic flexure of the colon. Some gas is present within minimally dilated loops of small bowel in the left hemiabdomen. Overall, the degree of small-bowel distention has  significantly improved compared to the prior radiograph. No acute osseous abnormality. IMPRESSION: 1. Significant improvement in the appearance of small-bowel obstruction compared to 07/24/2020. Only a small amount of gas-filled and borderline dilated loops of small bowel are identified in the left hemiabdomen. 2. No evidence of free air. Electronically Signed   By: Jacqulynn Cadet M.D.   On: 07/27/2020 10:49    Scheduled Meds: . aerochamber Z-Stat Plus/medium  1 each Other Once  . insulin aspart  0-20 Units Subcutaneous TID WC  . insulin detemir  15 Units Subcutaneous BID  . isosorbide mononitrate  30 mg Oral Daily  . nicotine  21 mg Transdermal Daily  . pantoprazole (PROTONIX) IV  40 mg Intravenous Q12H  . polyethylene glycol  17 g Oral Once  . sodium chloride flush  3 mL Intravenous Q12H   Continuous Infusions: . sodium chloride       LOS: 6 days     Barton Dubois, MD [facility]   To contact the attending provider between 7A-7P or the covering provider during after hours 7P-7A, please log into the web site [email or web address] and access using universal [place] password for that web site. If you do not have the password, please call the hospital operator.  07/28/2020, 12:57 PM

## 2020-07-29 ENCOUNTER — Encounter (HOSPITAL_COMMUNITY): Payer: Self-pay | Admitting: Gastroenterology

## 2020-07-29 ENCOUNTER — Telehealth: Payer: Self-pay | Admitting: Gastroenterology

## 2020-07-29 DIAGNOSIS — D649 Anemia, unspecified: Secondary | ICD-10-CM | POA: Diagnosis not present

## 2020-07-29 LAB — GLUCOSE, CAPILLARY
Glucose-Capillary: 125 mg/dL — ABNORMAL HIGH (ref 70–99)
Glucose-Capillary: 166 mg/dL — ABNORMAL HIGH (ref 70–99)
Glucose-Capillary: 158 mg/dL — ABNORMAL HIGH (ref 70–99)
Glucose-Capillary: 194 mg/dL — ABNORMAL HIGH (ref 70–99)

## 2020-07-29 MED ORDER — PANTOPRAZOLE SODIUM 40 MG PO TBEC
40.0000 mg | DELAYED_RELEASE_TABLET | Freq: Two times a day (BID) | ORAL | Status: DC
  Administered 2020-07-29 – 2020-07-30 (×2): 40 mg via ORAL
  Filled 2020-07-29 (×2): qty 1

## 2020-07-29 MED ORDER — MAGNESIUM HYDROXIDE 400 MG/5ML PO SUSP
30.0000 mL | Freq: Three times a day (TID) | ORAL | Status: DC
  Administered 2020-07-29 – 2020-07-30 (×4): 30 mL via ORAL
  Filled 2020-07-29 (×4): qty 30

## 2020-07-29 MED ORDER — DOCUSATE SODIUM 100 MG PO CAPS
100.0000 mg | ORAL_CAPSULE | Freq: Every day | ORAL | Status: DC
  Administered 2020-07-29 – 2020-07-30 (×2): 100 mg via ORAL
  Filled 2020-07-29 (×2): qty 1

## 2020-07-29 MED ORDER — MELATONIN 3 MG PO TABS
6.0000 mg | ORAL_TABLET | Freq: Every evening | ORAL | Status: DC | PRN
  Filled 2020-07-29: qty 2

## 2020-07-29 MED ORDER — POLYETHYLENE GLYCOL 3350 17 G PO PACK
17.0000 g | PACK | Freq: Every day | ORAL | Status: DC
  Administered 2020-07-29 – 2020-07-30 (×2): 17 g via ORAL
  Filled 2020-07-29 (×2): qty 1

## 2020-07-29 NOTE — Progress Notes (Signed)
° ° °  Subjective: No bowel movement yet. Mild discomfort lower abdomen but much improved since admission. +flatus. Advanced to solid food this morning.   Objective: Vital signs in last 24 hours: Temp:  [97.7 F (36.5 C)-98.4 F (36.9 C)] 98.4 F (36.9 C) (11/15 0443) Pulse Rate:  [79-88] 79 (11/15 0443) Resp:  [20] 20 (11/15 0443) BP: (118-134)/(50-55) 134/55 (11/15 0443) SpO2:  [95 %-97 %] 96 % (11/15 0443) Last BM Date: 07/23/20 General:   Alert and oriented, pleasant Head:  Normocephalic and atraumatic. Eyes:  No icterus, sclera clear. Conjuctiva pink.  Abdomen:  Bowel sounds present, soft, non-tender, non-distended. Obese Extremities:  With mild pedal edema. Neurologic:  Alert and  oriented x4   Intake/Output from previous day: No intake/output data recorded. Intake/Output this shift: Total I/O In: 3 [I.V.:3] Out: -   Lab Results: Recent Labs    07/26/20 1142 07/27/20 0729  WBC  --  8.6  HGB 8.3* 8.1*  HCT 29.9* 29.7*  PLT  --  255   BMET Recent Labs    07/27/20 0729  NA 133*  K 3.9  CL 98  CO2 26  GLUCOSE 133*  BUN 19  CREATININE 1.12  CALCIUM 8.7*     Studies/Results: DG Abd Portable 1V-Small Bowel Obstruction Protocol-initial, 8 hr delay  Result Date: 07/28/2020 CLINICAL DATA:  Bowel obstruction.  8 hour delay. EXAM: PORTABLE ABDOMEN - 1 VIEW COMPARISON:  July 28, 2020 FINDINGS: Oral contrast is noted in the small bowel and colon. No definite oral contrast at the level of the rectum. The small bowel loops appear to be nondilated. There is no free air. IMPRESSION: Nonobstructive bowel gas pattern. Oral contrast is noted in the small bowel and colon. No definite oral contrast at the level of the rectum. Electronically Signed   By: Constance Holster M.D.   On: 07/28/2020 19:54   DG Abd Portable 1V  Result Date: 07/28/2020 CLINICAL DATA:  Follow-up small-bowel obstruction protocol EXAM: PORTABLE ABDOMEN - 1 VIEW COMPARISON:  07/27/2020 FINDINGS:  Contrast previously largely present within the right colon has now progressed at least to the splenic flexure. No dilated small bowel loops are visible. IMPRESSION: Progression of contrast to the splenic flexure. Electronically Signed   By: Nelson Chimes M.D.   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   Clinically improving from SBO standpoint, tolerating heart healthy diet. Surgery continuing to follow.  Acute on chronic anemia: receiving 2 units PRBCs this admission. No overt GI bleeding. Last Hgb 8.1 two days ago. Will need outpatient colonoscopy due to poor prep earlier this year. H.pylori eradication documentation as outpatient. If capsule study needed as outpatient, recommend Agile first.   Plan: Appreciate Surgery following Heart healthy diet Follow-up with [facility] as outpatient to arrange colonoscopy.  Needs H.pylori eradication documentation as outpatient Agile capsule prior to capsule study Continue PPI BID  Annitta Needs, PhD, ANP-BC [facility]    LOS: 7 days    07/29/2020, 10:13 AM

## 2020-07-29 NOTE — Care Management Important Message (Signed)
Important Message  Patient Details  Name: Gregory Alvarado MRN: [id number] Date of Birth: 1951-05-18   Medicare Important Message Given:  Yes     [name] 07/29/2020, 12:42 PM

## 2020-07-29 NOTE — Progress Notes (Signed)
PROGRESS NOTE    Gregory Alvarado  SJG:[id number] DOB: June 10, 1951 DOA: 07/22/2020 PCP: Celene Squibb, MD    Chief Complaint  Patient presents with  . Shortness of Breath  . low hemoglobin    Brief Narrative:  69 year old gentleman with known prior history of tobacco abuse, COPD, diabetes, hypertension, obesity, hyperlipidemia sent to ED from office with a low hemoglobin.  In addition patient patient reports exertional chest pain. GI and gastroenterology consulted.  Patient is scheduled to undergo EGD later today.  Assessment & Plan:   Principal Problem:   Symptomatic anemia Active Problems:   Essential hypertension   Insulin dependent type 2 diabetes mellitus (HCC)   COPD (chronic obstructive pulmonary disease) (HCC)   Current smoker   Iron deficiency anemia   Melena   GI bleed   Chronic diastolic CHF (congestive heart failure) (HCC)   Guaiac positive stools   Folate deficiency   SBO (small bowel obstruction) (HCC)   Acute anemia of blood loss/GI bleed  -Secondary to AVMs -Continue PPI -patient is status post 1 unit PRBC transfusion and IV iron infusion. -Hemoglobin 8.1-8.3 -No overt bleeding reported; minimal reduction in hemoglobin level secondary to IV fluids and blood draws. -Continue to follow GI service recommendations.   -Repeat CBC in a.m.  Nausea, abdominal pain: Secondary to SBO/ileus -Supportive care and conservative management -Patient with history of diverticulitis surgical intervention in the past and CT scan suggesting transitional point on jejunum with SBO. -Follow general surgery recommendations; no need for NG tube currently -Continue as needed antiemetics and analgesics-continue IV fluids and follow-up lites -Patient denies abdominal pain, no nausea, no vomiting, passing gas. -Based on results from Gastrografin study plan is to further advance his diet to heart healthy and continue to be more aggressive with laxative therapy. -Continue to follow GI  and general surgery recommendation.  Chronic diastolic heart failure -Appears stable and euvolemic -Will continue holding HCTZ at this time in the setting of SBO/ileus  Exertional chest pain -Minimally elevated troponins and EKG shows ST and T wave abnormalities in the lateral leads and elevated BNP.  -Appears to be demand ischemia in the setting of anemia -Telemetry and 2D echo reassuring -Cardiology consultation appreciated; no further ischemic work-up anticipated. -Continue risk factor modifications and outpatient follow-up.  Iron deficiency/ microcytic anemia and folate deficiency:  -Status post iron infusion -Continue folic acid supplementation -Patient is status 1 unit of PRBC transfusion during this admission -Hemoglobin stable at 8.3 currently -Follow hemoglobin trend.  Leukocytosis: -Stress demargination and transfusion -CXR shows  Mild increased interstitial opacity at both lung bases suspicious for acute viral/atypical respiratory infection. He is a smoker and has a cough.  Suggesting bronchitis component. -Continue Zithromax.  COPD/ current smoker:  -Cessation counseling has been provided -Good oxygen saturation on room air and no requiring oxygen supplementation -No wheezing on examination. -Continue as needed bronchodilator therapy  Essential Hypertension:  -Currently stable and well-controlled -Continue current antihypertensive agents -Follow vital signs.  Type 2 DM:  CBG (last 3)  Recent Labs    07/28/20 2200 07/29/20 0757 07/29/20 1137  GLUCAP 141* 125* 166*  -Continue to follow CBGs -last A1c is 5.7% -Continue the use of sliding scale insulin and Levemir.   Tobacco abuse: -Cessation counseling has been provided -Continue nicotine patch.   DVT prophylaxis: scd's Code Status: (Full code) Family Communication: No family at bedside; patient expressed that he will update them by himself. Disposition:   Status is: Inpatient  Remains inpatient  appropriate because:Ongoing  diagnostic testing needed not appropriate for outpatient work up, IV treatments appropriate due to intensity of illness or inability to take PO and Inpatient level of care appropriate due to severity of illness   Dispo: The patient is from: Home              Anticipated d/c is to: Home              Anticipated d/c date is: 1 day              Patient currently is not medically stable to d/c.  Patient is no longer having nausea, vomiting or abdominal pain.  He is passing gas and feeling hungry.  Still no bowel movement.  After discussion with general surgery and GI there is concern for ongoing ileus. Gastrografin small bowel follow-through study  On 07/28/20 demonstrated contrast reaching all the way to the colon; following general surgery recommendations will advance diet to heart healthy and increase laxative therapy.    Consultants:   Cardiology  GI  General surgery  Procedures: egd ECHOCARDIOGRAM: See below for full details.  But in summary preserved ejection fraction, no wall motion abnormalities, no significant valvular disorder.   Antimicrobials: Azithromycin.     Subjective: No acute distress.  No fever, no overt bleeding.  Reports no chest pain, no nausea, no vomiting, no abdominal pain.  Passing gas, but still no bowel movements.  Feels hungry  Objective: Vitals:   07/28/20 0546 07/28/20 1358 07/28/20 2149 07/29/20 0443  BP: (!) 155/83 (!) 118/55 (!) 130/50 (!) 134/55  Pulse: 93 88 79 79  Resp: 19 20 20 20   Temp: 98.4 F (36.9 C) 97.7 F (36.5 C)  98.4 F (36.9 C)  TempSrc: Oral Oral  Oral  SpO2: 98% 97% 95% 96%  Weight:      Height:        Intake/Output Summary (Last 24 hours) at 07/29/2020 1536 Last data filed at 07/29/2020 0909 Gross per 24 hour  Intake 3 ml  Output --  Net 3 ml   Filed Weights   07/23/20 1607  Weight: 133 kg    Examination: General exam: Alert, awake, oriented x 3; no chest pain, no nausea, no vomiting,  continue to pass flatus and expressed no abdominal pain.  Still no bowel movements. Respiratory system: Clear to auscultation. Respiratory effort normal. Cardiovascular system:RRR. No murmurs, rubs, gallops. Gastrointestinal system: Abdomen is nondistended, soft and nontender. No organomegaly or masses felt. Normal bowel sounds heard. Central nervous system: Alert and oriented. No focal neurological deficits. Extremities: No cyanosis or clubbing. Skin: No petechiae. Psychiatry: Judgement and insight appear normal. Mood & affect appropriate.    Data Reviewed: I have personally reviewed following labs and imaging studies  CBC: Recent Labs  Lab 07/22/20 1938 07/22/20 1938 07/23/20 0600 07/23/20 0600 07/23/20 1931 07/24/20 0435 07/25/20 0402 07/26/20 1142 07/27/20 0729  WBC 10.3  --  12.0*  --   --  16.0* 13.9*  --  8.6  NEUTROABS 8.2*  --   --   --   --   --   --   --   --   HGB 6.2*   < > 8.5*   < > 8.7* 8.9* 8.7* 8.3* 8.1*  HCT 22.6*   < > 29.4*   < > 30.5* 31.5* 31.1* 29.9* 29.7*  MCV 78.7*  --  79.7*  --   --  78.6* 80.2  --  80.3  PLT 264  --  296  --   --  273 284  --  255   < > = values in this interval not displayed.    Basic Metabolic Panel: Recent Labs  Lab 07/23/20 0600 07/24/20 0435 07/25/20 0402 07/26/20 0607 07/27/20 0729  NA 137 131* 134* 136 133*  K 4.0 3.8 3.8 3.6 3.9  CL 97* 93* 97* 99 98  CO2 29 27 28 27 26   GLUCOSE 152* 146* 116* 95 133*  BUN 15 17 22 23 19   CREATININE 1.13 1.05 1.25* 1.26* 1.12  CALCIUM 9.6 9.2 9.2 8.7* 8.7*  MG  --   --   --  2.2  --     GFR: Estimated Creatinine Clearance: 90.3 mL/min (by C-G formula based on SCr of 1.12 mg/dL).  Liver Function Tests: Recent Labs  Lab 07/22/20 1938  AST 10*  ALT 10  ALKPHOS 62  BILITOT 0.4  PROT 6.8  ALBUMIN 3.6    CBG: Recent Labs  Lab 07/28/20 1105 07/28/20 1613 07/28/20 2200 07/29/20 0757 07/29/20 1137  GLUCAP 141* 176* 141* 125* 166*     Recent Results (from the  past 240 hour(s))  Resp Panel by RT PCR (RSV, Flu A&B, Covid) - Nasopharyngeal Swab     Status: None   Collection Time: 07/22/20  5:12 PM   Specimen: Nasopharyngeal Swab  Result Value Ref Range Status   SARS Coronavirus 2 by RT PCR NEGATIVE NEGATIVE Final    Comment: (NOTE) SARS-CoV-2 target nucleic acids are NOT DETECTED.  The SARS-CoV-2 RNA is generally detectable in upper respiratoy specimens during the acute phase of infection. The lowest concentration of SARS-CoV-2 viral copies this assay can detect is 131 copies/mL. A negative result does not preclude SARS-Cov-2 infection and should not be used as the sole basis for treatment or other patient management decisions. A negative result may occur with  improper specimen collection/handling, submission of specimen other than nasopharyngeal swab, presence of viral mutation(s) within the areas targeted by this assay, and inadequate number of viral copies (<131 copies/mL). A negative result must be combined with clinical observations, patient history, and epidemiological information. The expected result is Negative.  Fact Sheet for Patients:  PinkCheek.be  Fact Sheet for Healthcare Providers:  GravelBags.it  This test is no t yet approved or cleared by the [place] FDA and  has been authorized for detection and/or diagnosis of SARS-CoV-2 by FDA under an Emergency Use Authorization (EUA). This EUA will remain  in effect (meaning this test can be used) for the duration of the COVID-19 declaration under Section 564(b)(1) of the Act, 21 U.S.C. section 360bbb-3(b)(1), unless the authorization is terminated or revoked sooner.     Influenza A by PCR NEGATIVE NEGATIVE Final   Influenza B by PCR NEGATIVE NEGATIVE Final    Comment: (NOTE) The Xpert Xpress SARS-CoV-2/FLU/RSV assay is intended as an aid in  the diagnosis of influenza from Nasopharyngeal swab specimens and   should not be used as a sole basis for treatment. Nasal washings and  aspirates are unacceptable for Xpert Xpress SARS-CoV-2/FLU/RSV  testing.  Fact Sheet for Patients: PinkCheek.be  Fact Sheet for Healthcare Providers: GravelBags.it  This test is not yet approved or cleared by the [place] FDA and  has been authorized for detection and/or diagnosis of SARS-CoV-2 by  FDA under an Emergency Use Authorization (EUA). This EUA will remain  in effect (meaning this test can be used) for the duration of the  Covid-19 declaration under Section 564(b)(1) of the Act, 21  U.S.C. section 360bbb-3(b)(1), unless  the authorization is  terminated or revoked.    Respiratory Syncytial Virus by PCR NEGATIVE NEGATIVE Final    Comment: (NOTE) Fact Sheet for Patients: PinkCheek.be  Fact Sheet for Healthcare Providers: GravelBags.it  This test is not yet approved or cleared by the [place] FDA and  has been authorized for detection and/or diagnosis of SARS-CoV-2 by  FDA under an Emergency Use Authorization (EUA). This EUA will remain  in effect (meaning this test can be used) for the duration of the  COVID-19 declaration under Section 564(b)(1) of the Act, 21 U.S.C.  section 360bbb-3(b)(1), unless the authorization is terminated or  revoked. Performed at [facility], [address], [place], [place] [address]      Radiology Studies: DG Abd Portable 1V-Small Bowel Obstruction Protocol-initial, 8 hr delay  Result Date: 07/28/2020 CLINICAL DATA:  Bowel obstruction.  8 hour delay. EXAM: PORTABLE ABDOMEN - 1 VIEW COMPARISON:  July 28, 2020 FINDINGS: Oral contrast is noted in the small bowel and colon. No definite oral contrast at the level of the rectum. The small bowel loops appear to be nondilated. There is no free air. IMPRESSION: Nonobstructive bowel gas pattern. Oral  contrast is noted in the small bowel and colon. No definite oral contrast at the level of the rectum. Electronically Signed   By: Constance Holster M.D.   On: 07/28/2020 19:54   DG Abd Portable 1V  Result Date: 07/28/2020 CLINICAL DATA:  Follow-up small-bowel obstruction protocol EXAM: PORTABLE ABDOMEN - 1 VIEW COMPARISON:  07/27/2020 FINDINGS: Contrast previously largely present within the right colon has now progressed at least to the splenic flexure. No dilated small bowel loops are visible. IMPRESSION: Progression of contrast to the splenic flexure. Electronically Signed   By: Nelson Chimes M.D.   On: 07/28/2020 13:22    Scheduled Meds: . aerochamber Z-Stat Plus/medium  1 each Other Once  . docusate sodium  100 mg Oral Daily  . insulin aspart  0-20 Units Subcutaneous TID WC  . insulin detemir  15 Units Subcutaneous BID  . isosorbide mononitrate  30 mg Oral Daily  . magnesium hydroxide  30 mL Oral TID PC  . nicotine  21 mg Transdermal Daily  . pantoprazole  40 mg Oral BID  . polyethylene glycol  17 g Oral Daily  . sodium chloride flush  3 mL Intravenous Q12H   Continuous Infusions: . sodium chloride       LOS: 7 days     Barton Dubois, MD [facility]   To contact the attending provider between 7A-7P or the covering provider during after hours 7P-7A, please log into the web site [email or web address] and access using universal [place] password for that web site. If you do not have the password, please call the hospital operator.  07/29/2020, 3:36 PM

## 2020-07-29 NOTE — Telephone Encounter (Signed)
Gregory Alvarado, patient needs to be seen in hospital follow-up in next 2-4 weeks. Please bump up the January appt. Thanks!

## 2020-07-29 NOTE — Progress Notes (Signed)
6 Days Post-Op  Subjective: Patient denies any abdominal pain.  Is passing flatus.  No bowel movement yet.  Objective: Vital signs in last 24 hours: Temp:  [97.7 F (36.5 C)-98.4 F (36.9 C)] 98.4 F (36.9 C) (11/15 0443) Pulse Rate:  [79-88] 79 (11/15 0443) Resp:  [20] 20 (11/15 0443) BP: (118-134)/(50-55) 134/55 (11/15 0443) SpO2:  [95 %-97 %] 96 % (11/15 0443) Last BM Date: 07/23/20  Intake/Output from previous day: No intake/output data recorded. Intake/Output this shift: No intake/output data recorded.  General appearance: alert, cooperative and no distress GI: Soft, nontender.  Occasional bowel sounds appreciated.  Lab Results:  Recent Labs    07/26/20 1142 07/27/20 0729  WBC  --  8.6  HGB 8.3* 8.1*  HCT 29.9* 29.7*  PLT  --  255   BMET Recent Labs    07/27/20 0729  NA 133*  K 3.9  CL 98  CO2 26  GLUCOSE 133*  BUN 19  CREATININE 1.12  CALCIUM 8.7*   PT/INR No results for input(s): LABPROT, INR in the last 72 hours.  Studies/Results: DG Abd 2 Views  Result Date: 07/27/2020 CLINICAL DATA:  69 year old male with abdominal pain, constipation and small-bowel obstruction EXAM: ABDOMEN - 2 VIEW COMPARISON:  Prior x-ray 07/24/2020 FINDINGS: No evidence of free air. The visualized lungs are essentially clear. Small amount of oral contrast material visualized within the cecum and hepatic flexure of the colon. Some gas is present within minimally dilated loops of small bowel in the left hemiabdomen. Overall, the degree of small-bowel distention has significantly improved compared to the prior radiograph. No acute osseous abnormality. IMPRESSION: 1. Significant improvement in the appearance of small-bowel obstruction compared to 07/24/2020. Only a small amount of gas-filled and borderline dilated loops of small bowel are identified in the left hemiabdomen. 2. No evidence of free air. Electronically Signed   By: Jacqulynn Cadet M.D.   On: 07/27/2020 10:49   DG Abd  Portable 1V-Small Bowel Obstruction Protocol-initial, 8 hr delay  Result Date: 07/28/2020 CLINICAL DATA:  Bowel obstruction.  8 hour delay. EXAM: PORTABLE ABDOMEN - 1 VIEW COMPARISON:  July 28, 2020 FINDINGS: Oral contrast is noted in the small bowel and colon. No definite oral contrast at the level of the rectum. The small bowel loops appear to be nondilated. There is no free air. IMPRESSION: Nonobstructive bowel gas pattern. Oral contrast is noted in the small bowel and colon. No definite oral contrast at the level of the rectum. Electronically Signed   By: Constance Holster M.D.   On: 07/28/2020 19:54   DG Abd Portable 1V  Result Date: 07/28/2020 CLINICAL DATA:  Follow-up small-bowel obstruction protocol EXAM: PORTABLE ABDOMEN - 1 VIEW COMPARISON:  07/27/2020 FINDINGS: Contrast previously largely present within the right colon has now progressed at least to the splenic flexure. No dilated small bowel loops are visible. IMPRESSION: Progression of contrast to the splenic flexure. Electronically Signed   By: Nelson Chimes M.D.   On: 07/28/2020 13:22    Anti-infectives: Anti-infectives (From admission, onward)   Start     Dose/Rate Route Frequency Ordered Stop   07/25/20 1200  azithromycin (ZITHROMAX) 250 mg in dextrose 5 % 125 mL IVPB  Status:  Discontinued        250 mg 125 mL/hr over 60 Minutes Intravenous Every 24 hours 07/24/20 1706 07/25/20 1056   07/25/20 1200  azithromycin (ZITHROMAX) 500 mg in sodium chloride 0.9 % 250 mL IVPB  500 mg 250 mL/hr over 60 Minutes Intravenous Every 24 hours 07/25/20 1056 07/28/20 1241   07/23/20 1600  azithromycin (ZITHROMAX) tablet 500 mg  Status:  Discontinued        500 mg Oral Daily 07/23/20 1448 07/24/20 1706      Assessment/Plan: s/p Procedure(s): ESOPHAGOGASTRODUODENOSCOPY (EGD) WITH PROPOFOL Impression: Small bowel protocol shows no evidence of bowel obstruction.  Contrast is in the colon. Plan: Will advance to heart healthy diet.   Will give milk of magnesia.  LOS: 7 days    Aviva Signs 07/29/2020

## 2020-07-29 NOTE — Progress Notes (Signed)
Patient has had 3 large BMs after 1800.

## 2020-07-30 ENCOUNTER — Ambulatory Visit: Payer: Medicare Other | Admitting: Cardiology

## 2020-07-30 DIAGNOSIS — E6609 Other obesity due to excess calories: Secondary | ICD-10-CM

## 2020-07-30 DIAGNOSIS — Z6839 Body mass index (BMI) 39.0-39.9, adult: Secondary | ICD-10-CM

## 2020-07-30 LAB — BASIC METABOLIC PANEL
Anion gap: 8 (ref 5–15)
BUN: 15 mg/dL (ref 8–23)
CO2: 26 mmol/L (ref 22–32)
Calcium: 8.8 mg/dL — ABNORMAL LOW (ref 8.9–10.3)
Chloride: 101 mmol/L (ref 98–111)
Creatinine, Ser: 1.24 mg/dL (ref 0.61–1.24)
GFR, Estimated: >60 mL/min (ref >60)
Glucose, Bld: 132 mg/dL — ABNORMAL HIGH (ref 70–99)
Potassium: 4 mmol/L (ref 3.5–5.1)
Sodium: 135 mmol/L (ref 135–145)

## 2020-07-30 LAB — CBC
HCT: 30.6 % — ABNORMAL LOW (ref 39.0–52.0)
Hemoglobin: 8.4 g/dL — ABNORMAL LOW (ref 13.0–17.0)
MCH: 21.6 pg — ABNORMAL LOW (ref 26.0–34.0)
MCHC: 27.5 g/dL — ABNORMAL LOW (ref 30.0–36.0)
MCV: 78.9 fL — ABNORMAL LOW (ref 80.0–100.0)
Platelets: 293 10*3/uL (ref 150–400)
RBC: 3.88 MIL/uL — ABNORMAL LOW (ref 4.22–5.81)
RDW: 19.8 % — ABNORMAL HIGH (ref 11.5–15.5)
WBC: 8.7 10*3/uL (ref 4.0–10.5)
nRBC: 0 % (ref 0.0–0.2)

## 2020-07-30 LAB — GLUCOSE, CAPILLARY
Glucose-Capillary: 132 mg/dL — ABNORMAL HIGH (ref 70–99)
Glucose-Capillary: 135 mg/dL — ABNORMAL HIGH (ref 70–99)

## 2020-07-30 LAB — MAGNESIUM: Magnesium: 2.4 mg/dL (ref 1.7–2.4)

## 2020-07-30 MED ORDER — DOCUSATE SODIUM 100 MG PO CAPS
100.0000 mg | ORAL_CAPSULE | Freq: Every day | ORAL | 1 refills | Status: DC
Start: 2020-07-30 — End: 2021-01-07

## 2020-07-30 MED ORDER — POLYETHYLENE GLYCOL 3350 17 G PO PACK: 17.0000 g | PACK | Freq: Every day | ORAL | 1 refills | Status: DC

## 2020-07-30 NOTE — Progress Notes (Signed)
7 Days Post-Op  Subjective: Patient has had multiple bowel movements over the past 24 hours.  He denies any abdominal pain, nausea, or vomiting.  Objective: Vital signs in last 24 hours: Temp:  [98 F (36.7 C)-98.6 F (37 C)] 98 F (36.7 C) (11/16 0552) Pulse Rate:  [88-89] 88 (11/16 0552) Resp:  [20] 20 (11/16 0552) BP: (121-141)/(59-67) 121/59 (11/16 0552) SpO2:  [95 %-98 %] 95 % (11/16 0552) Last BM Date: 07/29/20  Intake/Output from previous day: 11/15 0701 - 11/16 0700 In: 723 [P.O.:720; I.V.:3] Out: -  Intake/Output this shift: No intake/output data recorded.  General appearance: alert, cooperative and no distress GI: soft, non-tender; bowel sounds normal; no masses,  no organomegaly  Lab Results:  Recent Labs    07/30/20 0627  WBC 8.7  HGB 8.4*  HCT 30.6*  PLT 293   BMET Recent Labs    07/30/20 0627  NA 135  K 4.0  CL 101  CO2 26  GLUCOSE 132*  BUN 15  CREATININE 1.24  CALCIUM 8.8*   PT/INR No results for input(s): LABPROT, INR in the last 72 hours.  Studies/Results: DG Abd Portable 1V-Small Bowel Obstruction Protocol-initial, 8 hr delay  Result Date: 07/28/2020 CLINICAL DATA:  Bowel obstruction.  8 hour delay. EXAM: PORTABLE ABDOMEN - 1 VIEW COMPARISON:  July 28, 2020 FINDINGS: Oral contrast is noted in the small bowel and colon. No definite oral contrast at the level of the rectum. The small bowel loops appear to be nondilated. There is no free air. IMPRESSION: Nonobstructive bowel gas pattern. Oral contrast is noted in the small bowel and colon. No definite oral contrast at the level of the rectum. Electronically Signed   By: Constance Holster M.D.   On: 07/28/2020 19:54   DG Abd Portable 1V  Result Date: 07/28/2020 CLINICAL DATA:  Follow-up small-bowel obstruction protocol EXAM: PORTABLE ABDOMEN - 1 VIEW COMPARISON:  07/27/2020 FINDINGS: Contrast previously largely present within the right colon has now progressed at least to the splenic  flexure. No dilated small bowel loops are visible. IMPRESSION: Progression of contrast to the splenic flexure. Electronically Signed   By: Nelson Chimes M.D.   On: 07/28/2020 13:22    Anti-infectives: Anti-infectives (From admission, onward)   Start     Dose/Rate Route Frequency Ordered Stop   07/25/20 1200  azithromycin (ZITHROMAX) 250 mg in dextrose 5 % 125 mL IVPB  Status:  Discontinued        250 mg 125 mL/hr over 60 Minutes Intravenous Every 24 hours 07/24/20 1706 07/25/20 1056   07/25/20 1200  azithromycin (ZITHROMAX) 500 mg in sodium chloride 0.9 % 250 mL IVPB        500 mg 250 mL/hr over 60 Minutes Intravenous Every 24 hours 07/25/20 1056 07/28/20 1241   07/23/20 1600  azithromycin (ZITHROMAX) tablet 500 mg  Status:  Discontinued        500 mg Oral Daily 07/23/20 1448 07/24/20 1706      Assessment/Plan: s/p Procedure(s): ESOPHAGOGASTRODUODENOSCOPY (EGD) WITH PROPOFOL Impression: Small bowel obstruction resolved.  Okay for discharge from surgery standpoint.  Will sign off.  LOS: 8 days    Aviva Signs 07/30/2020

## 2020-07-30 NOTE — Plan of Care (Signed)

## 2020-07-30 NOTE — Progress Notes (Signed)
Nsg Discharge Note  Admit Date:  07/22/2020 Discharge date: 07/30/2020   Gregory Alvarado to be D/C'd  Home per MD order.  AVS completed.  Copy for chart, and copy for patient signed, and dated. Patient/caregiver able to verbalize understanding.  Discharge Medication: Allergies as of 07/30/2020      Reactions   Caduet [amlodipine-atorvastatin] Swelling      Medication List    TAKE these medications   acetaminophen 325 MG tablet Commonly known as: TYLENOL Take 2 tablets (650 mg total) by mouth every 6 (six) hours as needed for mild pain (or Fever >/= 101).   albuterol 108 (90 Base) MCG/ACT inhaler Commonly known as: VENTOLIN HFA Inhale 2 puffs into the lungs every 6 (six) hours as needed for wheezing or shortness of breath.   atorvastatin 40 MG tablet Commonly known as: LIPITOR Take 40 mg by mouth at bedtime.   cyanocobalamin 1000 MCG/ML injection Commonly known as: (VITAMIN B-12) Inject 1,000 mcg into the muscle every 30 (thirty) days.   docusate sodium 100 MG capsule Commonly known as: COLACE Take 1 capsule (100 mg total) by mouth daily.   folic acid 1 MG tablet Commonly known as: FOLVITE Take 1 tablet (1 mg total) by mouth daily. What changed: when to take this   hydrochlorothiazide 12.5 MG tablet Commonly known as: HYDRODIURIL Take 12.5 mg by mouth at bedtime.   insulin NPH-regular Human (70-30) 100 UNIT/ML injection Inject 50 Units into the skin in the morning and at bedtime. Inject 50 units in morning and 60 units every evening   Insulin Pen Needle 31G X 8 MM Misc Commonly known as: B-D ULTRAFINE III SHORT PEN 1 each by Does not apply route as directed.   INSULIN SYRINGE .5CC/29G 29G X 1/2" 0.5 ML Misc Use to inject insulin 2 times a day   isosorbide mononitrate 30 MG 24 hr tablet Commonly known as: IMDUR Take 1 tablet (30 mg total) by mouth daily. May take 2 if needed   lisinopril 20 MG tablet Commonly known as: ZESTRIL Take 1 tablet (20 mg total) by  mouth daily.   nitroGLYCERIN 0.4 MG SL tablet Commonly known as: NITROSTAT Place 1 tablet (0.4 mg total) under the tongue every 5 (five) minutes as needed for chest pain.   ONE TOUCH ULTRA TEST test strip Generic drug: glucose blood TEST FOUR TIMES DAILY AS DIRECTED.   pantoprazole 40 MG tablet Commonly known as: PROTONIX Take 1 tablet (40 mg total) by mouth 2 (two) times daily before a meal.   polyethylene glycol 17 g packet Commonly known as: MIRALAX / GLYCOLAX Take 17 g by mouth daily.   traMADol 50 MG tablet Commonly known as: ULTRAM Take 50 mg by mouth in the morning and at bedtime.   zolpidem 10 MG tablet Commonly known as: AMBIEN Take 10 mg by mouth at bedtime.       Discharge Assessment: Vitals:   07/29/20 2142 07/30/20 0552  BP: 135/66 (!) 121/59  Pulse: 88 88  Resp: 20 20  Temp: 98.6 F (37 C) 98 F (36.7 C)  SpO2: 98% 95%   Skin clean, dry and intact without evidence of skin break down, no evidence of skin tears noted. IV catheter discontinued intact. Site without signs and symptoms of complications - no redness or edema noted at insertion site, patient denies c/o pain - only slight tenderness at site.  Dressing with slight pressure applied.  D/c Instructions-Education: Discharge instructions given to patient/family with verbalized understanding. D/c education  completed with patient/family including follow up instructions, medication list, d/c activities limitations if indicated, with other d/c instructions as indicated by MD - patient able to verbalize understanding, all questions fully answered. Patient instructed to return to ED, call 911, or call MD for any changes in condition.  Patient escorted via [place], and D/C home via private auto.  Zachery Conch, RN 07/30/2020 11:29 AM

## 2020-07-30 NOTE — Telephone Encounter (Signed)
There are no openings in 2-3 weeks, everyone is in January schedule.  I added him to a cancellation list

## 2020-07-30 NOTE — Discharge Summary (Signed)
Physician Discharge Summary  Gregory Alvarado VPX:[id number] DOB: 1951/04/10 DOA: 07/22/2020  PCP: Celene Squibb, MD  Admit date: 07/22/2020 Discharge date: 07/30/2020  Time spent: 35 minutes  Recommendations for Outpatient Follow-up:  1. Repeat CBC to follow hemoglobin trend 2. Repeat basic metabolic panel to follow electrolytes and renal function 3. Continue to follow patient CBGs/A1c with further adjustment to hypoglycemic regimen as required 4. Continue to assist patient with tobacco cessation.   Discharge Diagnoses:  Principal Problem:   Symptomatic anemia Active Problems:   Essential hypertension   Insulin dependent type 2 diabetes mellitus (HCC)   COPD (chronic obstructive pulmonary disease) (HCC)   Class 2 obesity due to excess calories with body mass index (BMI) of 39.0 to 39.9 in adult   Current smoker   Iron deficiency anemia   Melena   GI bleed   Chronic diastolic CHF (congestive heart failure) (HCC)   Guaiac positive stools   Folate deficiency   SBO (small bowel obstruction) ([name])   Discharge Condition: Stable and improved.  Discharged home with instruction to follow-up with PCP in 10 days  CODE STATUS: Full code  Diet recommendation: Heart healthy/modified carbohydrate diet.  Filed Weights   07/23/20 1607  Weight: 133 kg    History of present illness:  As per H&P written by Dr. [name] on 07/22/2020 The patient is a 69 y.o. year-old w/ hx of 29 yr tobacco use/ current, COPD, DM2 on insulin, HTN, obesity, HL sent to ED for low Hb on office labs , Hb 6.4. Has been fatigued w/ SOB/ DOE for about 1 week. No hx COVID exposure, no fevers, chills, n/v/d or abd pain. Has chronic smoker's cough. Has oxygen at home , wears it "some times". In ED Hb was 6.2, creat 1.0, other labs okay. ED have ordered 2u prbc's for transfusion. GI contacted by ED and plan is to start pt on PPI , clear liquids until MN then npo and GI will see in am.  Asked to see for admission.   Pt  states has had black stools for last few days.  No bloody stools. Tired when getting aroung, more tired than usual.  Chronic smoker's cough unchanged, no sputum color change or quantity change. No severe SOB.   Hospital Course:   Acute anemia of blood loss/GI bleed  -Secondary to AVMs -Continue PPI twice a day. -patient is status post 1 unit PRBC transfusion and IV iron infusion. -No overt bleeding reported -Continue to follow with GI service as an outpatient (office will arrange follow-up visit). -Repeat CBC demonstrated a hemoglobin of 8.4.  Nausea, abdominal pain: Secondary to SBO/ileus -Supportive care and conservative management -Patient with history of diverticulitis surgical intervention in the past and CT scan suggesting transitional point on jejunum with SBO. -Patient responded well to conservative management and no NG tube was required. -Good bowel movements achieved on 07/29/2020 evening and another 1 on 07/30/2020 morning. -No abdominal pain, no nausea, no vomiting. -Patient will be discharged home with as needed MiraLAX/Colace -Advised to follow low residue diet and adequate hydration.  Chronic diastolic heart failure -Appears stable and euvolemic -Home diuretic regimen -Advised to follow heart healthy/low-sodium diet -To check weight on daily basis.  Exertional chest pain -Minimally elevated troponins and EKG shows ST and T wave abnormalities in the lateral leads and elevated BNP.  -Appears to be demand ischemia in the setting of anemia -Telemetry and 2D echo reassuring -Cardiology consultation appreciated; no further ischemic work-up anticipated. -Continue risk factor modifications  and outpatient follow-up.  Iron deficiency/ microcytic anemia and folate deficiency:  -Status post iron infusion and transfusion of 1 unit of PRBCs. -Continue folic acid supplementation/multivitamin daily. -Hemoglobin stable at 8.4 currently -Follow hemoglobin trend at follow-up  visit..  Leukocytosis: -Stress demargination and transfusion -CXR shows  Mild increased interstitial opacity at both lung bases suspicious for acute viral/atypical respiratory infection. He is a smoker and has a cough.  Suggestive of bronchitis component. -treated with Zithromax.  COPD/ current smoker:  -Cessation counseling has been provided -Good oxygen saturation on room air and no requiring oxygen supplementation -No wheezing on examination. -Continue as needed bronchodilator therapy  Essential Hypertension:  -Currently stable and well-controlled -Continue current antihypertensive agents -Advised to follow heart healthy diet.  Type 2 DM:  CBG (last 3)  Recent Labs (last 2 labs)        Recent Labs    07/28/20 2200 07/29/20 0757 07/29/20 1137  GLUCAP 141* 125* 166*    -Continue to follow CBGs -last A1c is 5.7% -Continue modified carbohydrate diet as an outpatient.  Tobacco abuse: -Cessation counseling has been provided -Patient recommended to use nicotine patch as an outpatient for assistance with tobacco cessation.   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  ECHOCARDIOGRAM: See below for full details.  But in summary preserved ejection fraction, no wall motion abnormalities, no significant valvular disorder.   Consultations:  Cardiology  GI  General surgery  Discharge Exam: Vitals:   07/29/20 2142 07/30/20 0552  BP: 135/66 (!) 121/59  Pulse: 88 88  Resp: 20 20  Temp: 98.6 F (37 C) 98 F (36.7 C)  SpO2: 98% 95%   General exam: Alert, awake, oriented x 3; no chest pain, no nausea, no vomiting, continue to pass flatus and expressed no abdominal pain.    Good bowel movement and stool reported. Respiratory system: Clear to auscultation. Respiratory effort normal. Cardiovascular system:RRR. No murmurs, rubs, gallops. Gastrointestinal system: Abdomen is nondistended, soft and nontender. No organomegaly  or masses felt. Normal bowel sounds heard. Central nervous system: Alert and oriented. No focal neurological deficits. Extremities: No cyanosis or clubbing. Skin: No petechiae. Psychiatry: Judgement and insight appear normal. Mood & affect appropriate.     Discharge Instructions   Discharge Instructions    (HEART FAILURE PATIENTS) Call MD:  Anytime you have any of the following symptoms: 1) 3 pound weight gain in 24 hours or 5 pounds in 1 week 2) shortness of breath, with or without a dry hacking cough 3) swelling in the hands, feet or stomach 4) if you have to sleep on extra pillows at night in order to breathe.   Complete by: As directed    Diet - low sodium heart healthy   Complete by: As directed    Diet Carb Modified   Complete by: As directed    Discharge instructions   Complete by: As directed    Maintain adequate hydration Follow heart healthy/low-sodium diet and watch amount of carbohydrates. Arrange follow-up with PCP in 10 days Follow-up with gastroenterology as an outpatient Continue to check your weight on daily basis.     Allergies as of 07/30/2020      Reactions   Caduet [amlodipine-atorvastatin] Swelling      Medication List    TAKE these medications   acetaminophen 325 MG tablet Commonly known as: TYLENOL Take 2 tablets (650 mg total) by mouth every 6 (six) hours as needed for mild pain (or Fever >/= 101).   albuterol  108 (90 Base) MCG/ACT inhaler Commonly known as: VENTOLIN HFA Inhale 2 puffs into the lungs every 6 (six) hours as needed for wheezing or shortness of breath.   atorvastatin 40 MG tablet Commonly known as: LIPITOR Take 40 mg by mouth at bedtime.   cyanocobalamin 1000 MCG/ML injection Commonly known as: (VITAMIN B-12) Inject 1,000 mcg into the muscle every 30 (thirty) days.   docusate sodium 100 MG capsule Commonly known as: COLACE Take 1 capsule (100 mg total) by mouth daily.   folic acid 1 MG tablet Commonly known as:  FOLVITE Take 1 tablet (1 mg total) by mouth daily. What changed: when to take this   hydrochlorothiazide 12.5 MG tablet Commonly known as: HYDRODIURIL Take 12.5 mg by mouth at bedtime.   insulin NPH-regular Human (70-30) 100 UNIT/ML injection Inject 50 Units into the skin in the morning and at bedtime. Inject 50 units in morning and 60 units every evening   Insulin Pen Needle 31G X 8 MM Misc Commonly known as: B-D ULTRAFINE III SHORT PEN 1 each by Does not apply route as directed.   INSULIN SYRINGE .5CC/29G 29G X 1/2" 0.5 ML Misc Use to inject insulin 2 times a day   isosorbide mononitrate 30 MG 24 hr tablet Commonly known as: IMDUR Take 1 tablet (30 mg total) by mouth daily. May take 2 if needed   lisinopril 20 MG tablet Commonly known as: ZESTRIL Take 1 tablet (20 mg total) by mouth daily.   nitroGLYCERIN 0.4 MG SL tablet Commonly known as: NITROSTAT Place 1 tablet (0.4 mg total) under the tongue every 5 (five) minutes as needed for chest pain.   ONE TOUCH ULTRA TEST test strip Generic drug: glucose blood TEST FOUR TIMES DAILY AS DIRECTED.   pantoprazole 40 MG tablet Commonly known as: PROTONIX Take 1 tablet (40 mg total) by mouth 2 (two) times daily before a meal.   polyethylene glycol 17 g packet Commonly known as: MIRALAX / GLYCOLAX Take 17 g by mouth daily.   traMADol 50 MG tablet Commonly known as: ULTRAM Take 50 mg by mouth in the morning and at bedtime.   zolpidem 10 MG tablet Commonly known as: AMBIEN Take 10 mg by mouth at bedtime.      Allergies  Allergen Reactions  . Caduet [Amlodipine-Atorvastatin] Swelling    Follow-up Information    Celene Squibb, MD. Schedule an appointment as soon as possible for a visit in 10 day(s).   Specialty: Internal Medicine Contact information: [place] [address] [phone]        Arnoldo Lenis, MD .   Specialty: Cardiology Contact information: [address] [place]  [address] [phone]                The results of significant diagnostics from this hospitalization (including imaging, microbiology, ancillary and laboratory) are listed below for reference.    Significant Diagnostic Studies: DG Chest 2 View  Result Date: 07/22/2020 CLINICAL DATA:  69 year old male with cough, shortness of breath, congested. COVID-19 status pending. EXAM: CHEST - 2 VIEW COMPARISON:  Chest radiographs 05/15/2014 and earlier. FINDINGS: PA and lateral views. Stable lung volumes and mediastinal contours, within normal limits. Visualized tracheal air column is within normal limits. No pneumothorax, pulmonary edema, pleural effusion or confluent pulmonary opacity. However, there is mildly increased interstitial opacity at both lung bases. No acute osseous abnormality identified. Negative visible bowel gas pattern. IMPRESSION: Mild increased interstitial opacity at both lung bases  suspicious for acute viral/atypical respiratory infection in this setting. Electronically Signed   By: Genevie Ann M.D.   On: 07/22/2020 17:55   CT ABDOMEN PELVIS W CONTRAST  Result Date: 07/23/2020 CLINICAL DATA:  Nausea and abdominal pain since noon today EXAM: CT ABDOMEN AND PELVIS WITH CONTRAST TECHNIQUE: Multidetector CT imaging of the abdomen and pelvis was performed using the standard protocol following bolus administration of intravenous contrast. CONTRAST:  159mL OMNIPAQUE IOHEXOL 300 MG/ML  SOLN COMPARISON:  03/12/2015 FINDINGS: Lower chest: No acute pleural or parenchymal lung disease. Hepatobiliary: Calcified gallstones are identified without cholecystitis. The liver is unremarkable. No biliary dilation. Pancreas: Unremarkable. No pancreatic ductal dilatation or surrounding inflammatory changes. Spleen: Normal in size without focal abnormality. Adrenals/Urinary Tract: Interval enlargement of the left adrenal myelolipoma, now measuring 3.4 x 3.2 cm. The right adrenal is unremarkable. There is bilateral  renal cortical thinning. Stable cyst upper pole left kidney. Otherwise the kidneys enhance normally and symmetrically. No urinary tract calculi or obstructive uropathy. Bladder is decompressed, limiting its evaluation. Stomach/Bowel: There is dilation of the proximal small bowel measuring up to 3.3 cm in diameter. Scattered gas fluid levels are seen. There is abrupt transition within the mid jejunum in the central abdomen, reference image 63 of series 2. This is likely due to adhesion, given the adjacent postsurgical changes from hernia repair. Normal appendix right lower quadrant. Scattered diverticulosis of the distal colon, without diverticulitis. Vascular/Lymphatic: Aortic atherosclerosis. Pathologic adenopathy is seen within the pelvis, with a 17 mm short axis left external iliac lymph node seen on image 80. 18 mm short axis right external iliac node, reference image 82. There are multiple subcentimeter lymph nodes within the retroperitoneum and left para-aortic distribution. Reproductive: Prostate is not enlarged. Other: No free fluid or free gas. Postsurgical changes from previous midline ventral hernia repair. No residual or recurrent hernia. Musculoskeletal: There are no acute or destructive bony lesions. There is bilateral L5 spondylolysis, with grade 2 anterolisthesis of L5 on S1 and severe spondylosis. Reconstructed images demonstrate no additional findings. IMPRESSION: 1. Small-bowel obstruction, transition in the mid jejunum in the central abdomen as above. 2. Lymphadenopathy within the bilateral external iliac and retroperitoneal distributions as above, of uncertain etiology. 3. Cholelithiasis without cholecystitis. 4. Left adrenal myelolipoma, increased in size since prior study now measuring 3.4 cm. 5. 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 6.  Aortic Atherosclerosis (ICD10-I70.0). Electronically Signed   By: Randa Ngo M.D.   On: 07/23/2020 20:08   DG Abd 2  Views  Result Date: 07/27/2020 CLINICAL DATA:  69 year old male with abdominal pain, constipation and small-bowel obstruction EXAM: ABDOMEN - 2 VIEW COMPARISON:  Prior x-ray 07/24/2020 FINDINGS: No evidence of free air. The visualized lungs are essentially clear. Small amount of oral contrast material visualized within the cecum and hepatic flexure of the colon. Some gas is present within minimally dilated loops of small bowel in the left hemiabdomen. Overall, the degree of small-bowel distention has significantly improved compared to the prior radiograph. No acute osseous abnormality. IMPRESSION: 1. Significant improvement in the appearance of small-bowel obstruction compared to 07/24/2020. Only a small amount of gas-filled and borderline dilated loops of small bowel are identified in the left hemiabdomen. 2. No evidence of free air. Electronically Signed   By: Jacqulynn Cadet M.D.   On: 07/27/2020 10:49   DG ABD ACUTE 2+V W 1V CHEST  Result Date: 07/24/2020 CLINICAL DATA:  Abdominal pain EXAM: DG ABDOMEN ACUTE WITH 1 VIEW  CHEST COMPARISON:  CT 07/23/2020 FINDINGS: Minimal left basilar atelectasis. Lungs are otherwise clear. No pneumothorax or pleural effusion. Cardiac size within normal limits. Multiple gas-filled dilated loops of small bowel are seen within the mid abdomen, similar to that noted on prior CT examination. There is relatively little gas within the colon, decreased since prior CT examination. Together, the findings are in keeping with a developing mid to distal small bowel obstruction. No free intraperitoneal gas. No abnormal calcifications within the abdomen. Osseous structures are unremarkable. IMPRESSION: Developing mid to distal small bowel obstruction.  No free air. Electronically Signed   By: Fidela Salisbury MD   On: 07/24/2020 20:03   DG Abd Portable 1V-Small Bowel Obstruction Protocol-initial, 8 hr delay  Result Date: 07/28/2020 CLINICAL DATA:  Bowel obstruction.  8 hour delay.  EXAM: PORTABLE ABDOMEN - 1 VIEW COMPARISON:  July 28, 2020 FINDINGS: Oral contrast is noted in the small bowel and colon. No definite oral contrast at the level of the rectum. The small bowel loops appear to be nondilated. There is no free air. IMPRESSION: Nonobstructive bowel gas pattern. Oral contrast is noted in the small bowel and colon. No definite oral contrast at the level of the rectum. Electronically Signed   By: Constance Holster M.D.   On: 07/28/2020 19:54   DG Abd Portable 1V  Result Date: 07/28/2020 CLINICAL DATA:  Follow-up small-bowel obstruction protocol EXAM: PORTABLE ABDOMEN - 1 VIEW COMPARISON:  07/27/2020 FINDINGS: Contrast previously largely present within the right colon has now progressed at least to the splenic flexure. No dilated small bowel loops are visible. IMPRESSION: Progression of contrast to the splenic flexure. Electronically Signed   By: Nelson Chimes M.D.   On: 07/28/2020 13:22   ECHOCARDIOGRAM COMPLETE  Result Date: 07/24/2020    ECHOCARDIOGRAM REPORT   [name] Date of Exam: 07/24/2020 Medical Rec #:  [id number]     Height:       72.0 in Accession #:    [id number]    Weight:       293.2 lb Date of Birth:  [birth year]     BSA:          2.507 m Patient Age:    85 years      BP:           138/67 mmHg Patient Gender: M             HR:           102 bpm. Exam Location:  [name] Procedure: 2D Echo and Intracardiac Opacification Agent Indications:    Chest Pain 786.50 / R07.9  History:        Patient has prior history of Echocardiogram examinations, most                 recent 09/08/2018. CHF, COPD; Risk Factors:Current Smoker,                 Diabetes, Hypertension and Dyslipidemia.  Sonographer:    Leavy Cella RDCS (AE) Referring Phys: [place]  1. Left ventricular ejection fraction, by estimation, is 55 to 60%. The left ventricle has normal function. The left ventricle has no regional wall motion abnormalities. There is  moderate left ventricular hypertrophy. Left ventricular diastolic parameters are indeterminate.  2. Right ventricular systolic function is normal. The right ventricular size is normal.  3. The mitral valve is normal in structure. Mild mitral valve regurgitation. No evidence of mitral stenosis.  4. The aortic valve is tricuspid. There is mild calcification of the aortic valve. There is mild thickening of the aortic valve. Aortic valve regurgitation is not visualized. No aortic stenosis is present.  5. The inferior vena cava is normal in size with greater than 50% respiratory variability, suggesting right atrial pressure of 3 mmHg. FINDINGS  Left Ventricle: Left ventricular ejection fraction, by estimation, is 55 to 60%. The left ventricle has normal function. The left ventricle has no regional wall motion abnormalities. Definity contrast agent was given IV to delineate the left ventricular  endocardial borders. The left ventricular internal cavity size was normal in size. There is moderate left ventricular hypertrophy. Left ventricular diastolic parameters are indeterminate. Right Ventricle: The right ventricular size is normal. No increase in right ventricular wall thickness. Right ventricular systolic function is normal. Left Atrium: Left atrial size was normal in size. Right Atrium: Right atrial size was normal in size. Pericardium: There is no evidence of pericardial effusion. Mitral Valve: The mitral valve is normal in structure. There is mild thickening of the mitral valve leaflet(s). There is mild calcification of the mitral valve leaflet(s). Mild mitral annular calcification. Mild mitral valve regurgitation. No evidence of  mitral valve stenosis. Tricuspid Valve: The tricuspid valve is normal in structure. Tricuspid valve regurgitation is not demonstrated. No evidence of tricuspid stenosis. Aortic Valve: The aortic valve is tricuspid. There is mild calcification of the aortic valve. There is mild thickening  of the aortic valve. There is mild aortic valve annular calcification. Aortic valve regurgitation is not visualized. No aortic stenosis  is present. Aortic valve mean gradient measures 5.5 mmHg. Aortic valve peak gradient measures 10.9 mmHg. Aortic valve area, by VTI measures 2.48 cm. Pulmonic Valve: The pulmonic valve was not well visualized. Pulmonic valve regurgitation is not visualized. No evidence of pulmonic stenosis. Aorta: The aortic root is normal in size and structure. Venous: The inferior vena cava is normal in size with greater than 50% respiratory variability, suggesting right atrial pressure of 3 mmHg. IAS/Shunts: No atrial level shunt detected by color flow Doppler.  LEFT VENTRICLE PLAX 2D LVIDd:         4.96 cm  Diastology LVIDs:         4.14 cm  LV e' medial:    8.92 cm/s LV PW:         1.50 cm  LV E/e' medial:  14.9 LV IVS:        1.55 cm  LV e' lateral:   13.60 cm/s LVOT diam:     2.00 cm  LV E/e' lateral: 9.8 LV SV:         76 LV SV Index:   30 LVOT Area:     3.14 cm  RIGHT VENTRICLE RV S prime:     16.40 cm/s TAPSE (M-mode): 2.4 cm LEFT ATRIUM             Index       RIGHT ATRIUM           Index LA diam:        4.50 cm 1.80 cm/m  RA Area:     16.90 cm LA Vol (A2C):   61.2 ml 24.41 ml/m RA Volume:   45.90 ml  18.31 ml/m LA Vol (A4C):   60.8 ml 24.25 ml/m LA Biplane Vol: 67.6 ml 26.97 ml/m  AORTIC VALVE AV Area (Vmax):    2.04 cm AV Area (Vmean):   1.97 cm AV Area (VTI):  2.48 cm AV Vmax:           165.30 cm/s AV Vmean:          108.114 cm/s AV VTI:            0.305 m AV Peak Grad:      10.9 mmHg AV Mean Grad:      5.5 mmHg LVOT Vmax:         107.40 cm/s LVOT Vmean:        67.714 cm/s LVOT VTI:          0.241 m LVOT/AV VTI ratio: 0.79  AORTA Ao Root diam: 2.90 cm MITRAL VALVE MV Area (PHT): 5.79 cm     SHUNTS MV Decel Time: 131 msec     Systemic VTI:  0.24 m MR Peak grad: 112.4 mmHg    Systemic Diam: 2.00 cm MR Mean grad: 79.0 mmHg MR Vmax:      530.00 cm/s MR Vmean:     426.0 cm/s  MV E velocity: 133.00 cm/s MV A velocity: 129.00 cm/s MV E/A ratio:  1.03 Carlyle Dolly MD Electronically signed by Carlyle Dolly MD Signature Date/Time: 07/24/2020/12:04:52 PM    Final     Microbiology: Recent Results (from the past 240 hour(s))  Resp Panel by RT PCR (RSV, Flu A&B, Covid) - Nasopharyngeal Swab     Status: None   Collection Time: 07/22/20  5:12 PM   Specimen: Nasopharyngeal Swab  Result Value Ref Range Status   SARS Coronavirus 2 by RT PCR NEGATIVE NEGATIVE Final    Comment: (NOTE) SARS-CoV-2 target nucleic acids are NOT DETECTED.  The SARS-CoV-2 RNA is generally detectable in upper respiratoy specimens during the acute phase of infection. The lowest concentration of SARS-CoV-2 viral copies this assay can detect is 131 copies/mL. A negative result does not preclude SARS-Cov-2 infection and should not be used as the sole basis for treatment or other patient management decisions. A negative result may occur with  improper specimen collection/handling, submission of specimen other than nasopharyngeal swab, presence of viral mutation(s) within the areas targeted by this assay, and inadequate number of viral copies (<131 copies/mL). A negative result must be combined with clinical observations, patient history, and epidemiological information. The expected result is Negative.  Fact Sheet for Patients:  PinkCheek.be  Fact Sheet for Healthcare Providers:  GravelBags.it  This test is no t yet approved or cleared by the [place] FDA and  has been authorized for detection and/or diagnosis of SARS-CoV-2 by FDA under an Emergency Use Authorization (EUA). This EUA will remain  in effect (meaning this test can be used) for the duration of the COVID-19 declaration under Section 564(b)(1) of the Act, 21 U.S.C. section 360bbb-3(b)(1), unless the authorization is terminated or revoked sooner.     Influenza A by  PCR NEGATIVE NEGATIVE Final   Influenza B by PCR NEGATIVE NEGATIVE Final    Comment: (NOTE) The Xpert Xpress SARS-CoV-2/FLU/RSV assay is intended as an aid in  the diagnosis of influenza from Nasopharyngeal swab specimens and  should not be used as a sole basis for treatment. Nasal washings and  aspirates are unacceptable for Xpert Xpress SARS-CoV-2/FLU/RSV  testing.  Fact Sheet for Patients: PinkCheek.be  Fact Sheet for Healthcare Providers: GravelBags.it  This test is not yet approved or cleared by the [place] FDA and  has been authorized for detection and/or diagnosis of SARS-CoV-2 by  FDA under an Emergency Use Authorization (EUA). This EUA will remain  in effect (meaning  this test can be used) for the duration of the  Covid-19 declaration under Section 564(b)(1) of the Act, 21  U.S.C. section 360bbb-3(b)(1), unless the authorization is  terminated or revoked.    Respiratory Syncytial Virus by PCR NEGATIVE NEGATIVE Final    Comment: (NOTE) Fact Sheet for Patients: PinkCheek.be  Fact Sheet for Healthcare Providers: GravelBags.it  This test is not yet approved or cleared by the [place] FDA and  has been authorized for detection and/or diagnosis of SARS-CoV-2 by  FDA under an Emergency Use Authorization (EUA). This EUA will remain  in effect (meaning this test can be used) for the duration of the  COVID-19 declaration under Section 564(b)(1) of the Act, 21 U.S.C.  section 360bbb-3(b)(1), unless the authorization is terminated or  revoked. Performed at [facility], [address], [place], [place] [address]      Labs: Basic Metabolic Panel: Recent Labs  Lab 07/24/20 0435 07/25/20 0402 07/26/20 0607 07/27/20 0729 07/30/20 0627  NA 131* 134* 136 133* 135  K 3.8 3.8 3.6 3.9 4.0  CL 93* 97* 99 98 101  CO2 27 28 27 26 26   GLUCOSE 146* 116* 95  133* 132*  BUN 17 22 23 19 15   CREATININE 1.05 1.25* 1.26* 1.12 1.24  CALCIUM 9.2 9.2 8.7* 8.7* 8.8*  MG  --   --  2.2  --  2.4   Liver Function Tests: No results for input(s): AST, ALT, ALKPHOS, BILITOT, PROT, ALBUMIN in the last 168 hours. No results for input(s): LIPASE, AMYLASE in the last 168 hours. No results for input(s): AMMONIA in the last 168 hours. CBC: Recent Labs  Lab 07/24/20 0435 07/25/20 0402 07/26/20 1142 07/27/20 0729 07/30/20 0627  WBC 16.0* 13.9*  --  8.6 8.7  HGB 8.9* 8.7* 8.3* 8.1* 8.4*  HCT 31.5* 31.1* 29.9* 29.7* 30.6*  MCV 78.6* 80.2  --  80.3 78.9*  PLT 273 284  --  255 293   Cardiac Enzymes: No results for input(s): CKTOTAL, CKMB, CKMBINDEX, TROPONINI in the last 168 hours. BNP: BNP (last 3 results) Recent Labs    07/22/20 1936  BNP 165.0*    ProBNP (last 3 results) No results for input(s): PROBNP in the last 8760 hours.  CBG: Recent Labs  Lab 07/29/20 0757 07/29/20 1137 07/29/20 1646 07/29/20 2140 07/30/20 0727  GLUCAP 125* 166* 158* 194* 132*    Signed:  Barton Dubois MD.  [facility] 07/30/2020, 9:48 AM

## 2020-08-01 DIAGNOSIS — J449 Chronic obstructive pulmonary disease, unspecified: Secondary | ICD-10-CM | POA: Diagnosis not present

## 2020-08-01 DIAGNOSIS — E782 Mixed hyperlipidemia: Secondary | ICD-10-CM | POA: Diagnosis not present

## 2020-08-01 DIAGNOSIS — I10 Essential (primary) hypertension: Secondary | ICD-10-CM | POA: Diagnosis not present

## 2020-08-01 DIAGNOSIS — D509 Iron deficiency anemia, unspecified: Secondary | ICD-10-CM | POA: Diagnosis not present

## 2020-08-01 DIAGNOSIS — E1165 Type 2 diabetes mellitus with hyperglycemia: Secondary | ICD-10-CM | POA: Diagnosis not present

## 2020-08-02 ENCOUNTER — Encounter: Payer: Self-pay | Admitting: *Deleted

## 2020-08-02 ENCOUNTER — Other Ambulatory Visit: Payer: Self-pay | Admitting: *Deleted

## 2020-08-02 NOTE — Patient Outreach (Signed)
[facility]) Care Management [facility] CM Telephone Outreach- EMMI Red-Alert notification/ General discharge Post-hospital discharge day # 3 PCP office completes Transition of Care follow up post-hospital discharge 08/02/2020  Gregory Alvarado [birth year] [id number]  EMMI Red-Alert notification/ General Discharge EMMI call date/ day #:  Thursday August 01, 2020; day # 1 Red-Alert reason(s): "Unfilled prescriptions;" "Other questions/ problems"  Successful initial outreach attempt to [employer], 69 y/o male referred to [facility] CM this morning by [facility] CMA for EMMI Red-Alert notification as above.  Patient was recently hospitalized November 9-16, 2021 for symptomatic anemia; he was discharged to home/ self-care without home health services in place.  Patient has history including, but not limited to, HTN; DM-II; COPD with ongoing tobacco use; obesity, and CHF.  HIPAA/ identity verified and purpose of call/ [facility] CM program/ services discussed with patient, who immediately agrees to complete screening call for EMMI Red-Alert, and also immediately and adamantly declines ongoing participation in [place] program; states that he is doing fine and is not interested in having regular phone calls, as he is able to self-manage his care at home.  Reports he had PCP office visit yesterday and adds that his medication concern was addressed- stated that the hospital discharge physician wrote a prescription for a medicine that is an OTC medication and his PCP advised for him to pick it up from his outpatient pharmacy, which he has now done.  He declines medication review today and states that "everything is under control" around his medications, which he self-manages.  Reports he continues to drive self to all appointments and errands.  Denies clinical concerns and sounds to be in no distress throughout screening phone call today.  Screening partially completed per patient preference.  Patient denies further  issues, concerns, or problems today.  I provided/ confirmed that patient has my direct phone number, and advised that he would receive a letter in the mail further describing [facility] CM program-- I encouraged him to call [place] office or myself directly should he change his mind and wish to participate in [facility] CM program in the future- he is agreeable, but throughout phone call he adamantly states several times that he is not interested in participating.  Plan:  Will make patient inactive with [facility] CM program and make patient's PCP aware of same.  Oneta Rack, RN, BSN, [employer] [facility] Care Management  [phone]

## 2020-08-06 ENCOUNTER — Encounter (HOSPITAL_COMMUNITY): Payer: Self-pay | Admitting: Surgery

## 2020-08-06 ENCOUNTER — Other Ambulatory Visit: Payer: Self-pay

## 2020-08-06 DIAGNOSIS — J449 Chronic obstructive pulmonary disease, unspecified: Secondary | ICD-10-CM | POA: Diagnosis not present

## 2020-08-07 ENCOUNTER — Inpatient Hospital Stay (HOSPITAL_COMMUNITY): Payer: Medicare Other | Attending: Hematology | Admitting: Hematology

## 2020-08-07 VITALS — BP 154/61 | HR 92 | Temp 97.3°F | Resp 20 | Ht 72.0 in | Wt 284.1 lb

## 2020-08-07 DIAGNOSIS — K56609 Unspecified intestinal obstruction, unspecified as to partial versus complete obstruction: Secondary | ICD-10-CM | POA: Insufficient documentation

## 2020-08-07 DIAGNOSIS — R10819 Abdominal tenderness, unspecified site: Secondary | ICD-10-CM | POA: Diagnosis not present

## 2020-08-07 DIAGNOSIS — I110 Hypertensive heart disease with heart failure: Secondary | ICD-10-CM | POA: Diagnosis not present

## 2020-08-07 DIAGNOSIS — D509 Iron deficiency anemia, unspecified: Secondary | ICD-10-CM | POA: Diagnosis not present

## 2020-08-07 DIAGNOSIS — R079 Chest pain, unspecified: Secondary | ICD-10-CM

## 2020-08-07 DIAGNOSIS — Z833 Family history of diabetes mellitus: Secondary | ICD-10-CM | POA: Insufficient documentation

## 2020-08-07 DIAGNOSIS — M4317 Spondylolisthesis, lumbosacral region: Secondary | ICD-10-CM | POA: Diagnosis not present

## 2020-08-07 DIAGNOSIS — K5730 Diverticulosis of large intestine without perforation or abscess without bleeding: Secondary | ICD-10-CM | POA: Diagnosis not present

## 2020-08-07 DIAGNOSIS — M1990 Unspecified osteoarthritis, unspecified site: Secondary | ICD-10-CM | POA: Diagnosis not present

## 2020-08-07 DIAGNOSIS — R5383 Other fatigue: Secondary | ICD-10-CM | POA: Insufficient documentation

## 2020-08-07 DIAGNOSIS — R0602 Shortness of breath: Secondary | ICD-10-CM | POA: Diagnosis not present

## 2020-08-07 DIAGNOSIS — E119 Type 2 diabetes mellitus without complications: Secondary | ICD-10-CM | POA: Insufficient documentation

## 2020-08-07 DIAGNOSIS — D500 Iron deficiency anemia secondary to blood loss (chronic): Secondary | ICD-10-CM

## 2020-08-07 DIAGNOSIS — K8020 Calculus of gallbladder without cholecystitis without obstruction: Secondary | ICD-10-CM | POA: Insufficient documentation

## 2020-08-07 DIAGNOSIS — M7989 Other specified soft tissue disorders: Secondary | ICD-10-CM

## 2020-08-07 DIAGNOSIS — I700 Atherosclerosis of aorta: Secondary | ICD-10-CM | POA: Insufficient documentation

## 2020-08-07 DIAGNOSIS — I509 Heart failure, unspecified: Secondary | ICD-10-CM | POA: Diagnosis not present

## 2020-08-07 DIAGNOSIS — E669 Obesity, unspecified: Secondary | ICD-10-CM | POA: Insufficient documentation

## 2020-08-07 DIAGNOSIS — D1779 Benign lipomatous neoplasm of other sites: Secondary | ICD-10-CM | POA: Diagnosis not present

## 2020-08-07 DIAGNOSIS — J449 Chronic obstructive pulmonary disease, unspecified: Secondary | ICD-10-CM | POA: Insufficient documentation

## 2020-08-07 DIAGNOSIS — K2980 Duodenitis without bleeding: Secondary | ICD-10-CM | POA: Diagnosis not present

## 2020-08-07 DIAGNOSIS — Z125 Encounter for screening for malignant neoplasm of prostate: Secondary | ICD-10-CM | POA: Diagnosis not present

## 2020-08-07 DIAGNOSIS — K921 Melena: Secondary | ICD-10-CM | POA: Insufficient documentation

## 2020-08-07 DIAGNOSIS — R590 Localized enlarged lymph nodes: Secondary | ICD-10-CM | POA: Insufficient documentation

## 2020-08-07 DIAGNOSIS — Z8249 Family history of ischemic heart disease and other diseases of the circulatory system: Secondary | ICD-10-CM

## 2020-08-07 DIAGNOSIS — I340 Nonrheumatic mitral (valve) insufficiency: Secondary | ICD-10-CM

## 2020-08-07 DIAGNOSIS — R609 Edema, unspecified: Secondary | ICD-10-CM | POA: Diagnosis not present

## 2020-08-07 DIAGNOSIS — F17210 Nicotine dependence, cigarettes, uncomplicated: Secondary | ICD-10-CM | POA: Insufficient documentation

## 2020-08-07 DIAGNOSIS — R935 Abnormal findings on diagnostic imaging of other abdominal regions, including retroperitoneum: Secondary | ICD-10-CM | POA: Insufficient documentation

## 2020-08-07 DIAGNOSIS — Z79899 Other long term (current) drug therapy: Secondary | ICD-10-CM | POA: Insufficient documentation

## 2020-08-07 LAB — RETICULOCYTES
Immature Retic Fract: 16.6 % — ABNORMAL HIGH (ref 2.3–15.9)
RBC.: 4.66 MIL/uL (ref 4.22–5.81)
Retic Count, Absolute: 54.1 10*3/uL (ref 19.0–186.0)
Retic Ct Pct: 1.2 % (ref 0.4–3.1)

## 2020-08-07 LAB — CBC WITH DIFFERENTIAL/PLATELET
Abs Immature Granulocytes: 0.04 10*3/uL (ref 0.00–0.07)
Basophils Absolute: 0.1 10*3/uL (ref 0.0–0.1)
Basophils Relative: 1 %
Eosinophils Absolute: 0.4 10*3/uL (ref 0.0–0.5)
Eosinophils Relative: 4 %
HCT: 35.2 % — ABNORMAL LOW (ref 39.0–52.0)
Hemoglobin: 9.8 g/dL — ABNORMAL LOW (ref 13.0–17.0)
Immature Granulocytes: 0 %
Lymphocytes Relative: 13 %
Lymphs Abs: 1.3 10*3/uL (ref 0.7–4.0)
MCH: 21.9 pg — ABNORMAL LOW (ref 26.0–34.0)
MCHC: 27.8 g/dL — ABNORMAL LOW (ref 30.0–36.0)
MCV: 78.6 fL — ABNORMAL LOW (ref 80.0–100.0)
Monocytes Absolute: 0.7 10*3/uL (ref 0.1–1.0)
Monocytes Relative: 7 %
Neutro Abs: 7.8 10*3/uL — ABNORMAL HIGH (ref 1.7–7.7)
Neutrophils Relative %: 75 %
Platelets: 456 10*3/uL — ABNORMAL HIGH (ref 150–400)
RBC: 4.48 MIL/uL (ref 4.22–5.81)
RDW: 19.9 % — ABNORMAL HIGH (ref 11.5–15.5)
WBC: 10.4 10*3/uL (ref 4.0–10.5)
nRBC: 0 % (ref 0.0–0.2)

## 2020-08-07 LAB — LACTATE DEHYDROGENASE: LDH: 169 U/L (ref 98–192)

## 2020-08-07 LAB — FOLATE: Folate: 5.2 ng/mL — ABNORMAL LOW (ref >5.9)

## 2020-08-07 LAB — PSA: Prostatic Specific Antigen: 0.84 ng/mL (ref 0.00–4.00)

## 2020-08-07 NOTE — Patient Instructions (Signed)
[place] at [facility] Discharge Instructions  You were seen today by Dr. Delton Coombes. He is a Merchandiser, retail.  He talked with you about your medical history, family history and the events that led you here today.  He wants to give you 2 iron infusions because your blood counts are dropping since you got out of the hospital.  He wants to do some additional lab work today to assess the reason for your continued decrease in blood counts.  We also will do a PET scan to check on the lymphnodes that were found in your pelvic region/ groin.  We will see you back after the PET scan.    Thank you for choosing [place] at [facility] to provide your oncology and hematology care.  To afford each patient quality time with our provider, please arrive at least 15 minutes before your scheduled appointment time.   If you have a lab appointment with the [facility] please come in thru the Main Entrance and check in at the main information desk.  You need to re-schedule your appointment should you arrive 10 or more minutes late.  We strive to give you quality time with our providers, and arriving late affects you and other patients whose appointments are after yours.  Also, if you no show three or more times for appointments you may be dismissed from the clinic at the providers discretion.     Again, thank you for choosing [facility].  Our hope is that these requests will decrease the amount of time that you wait before being seen by our physicians.       _____________________________________________________________  Should you have questions after your visit to [facility], please contact our office at [phone] and follow the prompts.  Our office hours are 8:00 a.m. and 4:30 p.m. Monday - Friday.  Please note that voicemails left after 4:00 p.m. may not be returned until the following business day.  We are closed  weekends and major holidays.  You do have access to a nurse 24-7, just call the main number to the clinic [phone] and do not press any options, hold on the line and a nurse will answer the phone.    For prescription refill requests, have your pharmacy contact our office and allow 72 hours.    Due to Covid, you will need to wear a mask upon entering the hospital. If you do not have a mask, a mask will be given to you at the Main Entrance upon arrival. For doctor visits, patients may have 1 support person age 56 or older with them. For treatment visits, patients can not have anyone with them due to social distancing guidelines and our immunocompromised population.

## 2020-08-07 NOTE — Progress Notes (Signed)
[facility] [address], [place] [address]   CLINIC:  Medical Oncology/Hematology  Patient Care Team: Celene Squibb, MD as PCP - General (Internal Medicine) Harl Bowie, Alphonse Guild, MD as PCP - Cardiology (Cardiology) Danie Binder, MD (Inactive) as Consulting Physician (Gastroenterology)  CHIEF COMPLAINTS/PURPOSE OF CONSULTATION:  Evaluation of iron deficiency anemia  HISTORY OF PRESENTING ILLNESS:  Gregory Alvarado 69 y.o. male is here because of evaluation of iron deficiency anemia, at the request of Dr. Allyn Kenner.  Today he reports feeling okay. He denies having any nosebleeds, hematochezia or hematuria, though he has melena often. He needed 2 units of blood on 11/8 and had an EGD done on 11/9 by Dr. 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 He has taken iron tablets before but reports getting terrible constipation; he received 2 Feraheme on 9/8 and 05/29/2020. He reports that he lost 15 lbs due to being in the hospital and disliking the hospital food. He denies having any issues with urination or prostate, though he reports having leg swelling often. He denies having CP, headaches, vision changes, or lightheadedness.  He denies having any history of cancer in his family. He used to run a Retail banker for 30 years and he denies being around chemicals. He smoked 2.5 PPD for 60 years.  MEDICAL HISTORY:  Past Medical History:  Diagnosis Date  . Anemia   . Arthritis    left shoulder  . COPD (chronic obstructive pulmonary disease) ([place])   . Diabetes mellitus without complication ([place])   . Dyslipidemia   . Hypertension   . Insomnia   . Insomnia   . Morbid obesity ([place])   . Tobacco use     SURGICAL HISTORY: Past Surgical History:  Procedure Laterality Date  . BIOPSY  10/28/2019   Procedure: BIOPSY;  Surgeon: Danie Binder, MD;  [facility];  Service: Endoscopy;;  gastric  . BIOPSY  04/15/2020   Procedure: BIOPSY;  Surgeon: Daneil Dolin, MD;  [facility];  Service: Endoscopy;;  gastric  . COLON RESECTION  1990s?   DIVERTICULITIS  . COLONOSCOPY WITH PROPOFOL N/A 04/15/2020   Rourk: Poor colon prep. two 6 to 8 mm polyps removed from the hepatic flexure, tubular adenomas.  Repeat colonoscopy later this year.  . ESOPHAGOGASTRODUODENOSCOPY (EGD) WITH PROPOFOL N/A 10/28/2019   Dr. Oneida Alar: Multiple cratered gastric ulcers, gastritis/duodenitis.  Path showed H. pylori.  Patient treated with amoxicillin/Biaxin/PPI twice daily.  . ESOPHAGOGASTRODUODENOSCOPY (EGD) WITH PROPOFOL N/A 04/15/2020   Rourk: Patchy gastric erythema status post biopsy to document eradication of H. pylori.  Biopsy showed persistent H. pylori.  Previously noted gastric ulcer is completely healed.  . ESOPHAGOGASTRODUODENOSCOPY (EGD) WITH PROPOFOL N/A 07/23/2020   Procedure: ESOPHAGOGASTRODUODENOSCOPY (EGD) WITH PROPOFOL;  Surgeon: Harvel Quale, MD;  [facility];  Service: Gastroenterology;  Laterality: N/A;  . HERNIA REPAIR     UHR  . MALONEY DILATION N/A 04/15/2020   Procedure: Venia Minks DILATION;  Surgeon: Daneil Dolin, MD;  [facility];  Service: Endoscopy;  Laterality: N/A;  . POLYPECTOMY  04/15/2020   Procedure: POLYPECTOMY;  Surgeon: Daneil Dolin, MD;  [facility];  Service: Endoscopy;;  colon  . ROTATOR CUFF REPAIR Bilateral   . TYMPANOPLASTY      SOCIAL HISTORY: Social History   Socioeconomic History  . Marital status: Divorced    Spouse name: Not on file  . Number of  children: 2  . Years of education: Not on file  . Highest education level: Not on file  Occupational History  . Occupation: Retired  Tobacco Use  . Smoking status: Current Every Day Smoker    Packs/day: 2.00    Types: Cigarettes  . Smokeless tobacco: Never Used  Vaping Use  . Vaping Use: Never used  Substance and Sexual Activity  . Alcohol use: Not Currently    Comment: rare  . Drug use: No  . Sexual activity:  Not on file  Other Topics Concern  . Not on file  Social History Narrative  . Not on file   Social Determinants of Health   Financial Resource Strain: Low Risk   . Difficulty of Paying Living Expenses: Not very hard  Food Insecurity: No Food Insecurity  . Worried About Charity fundraiser in the Last Year: Never true  . Ran Out of Food in the Last Year: Never true  Transportation Needs: No Transportation Needs  . Lack of Transportation (Medical): No  . Lack of Transportation (Non-Medical): No  Physical Activity: Inactive  . Days of Exercise per Week: 0 days  . Minutes of Exercise per Session: 0 min  Stress: No Stress Concern Present  . Feeling of Stress : Not at all  Social Connections: Socially Isolated  . Frequency of Communication with Friends and Family: Three times a week  . Frequency of Social Gatherings with Friends and Family: Three times a week  . Attends Religious Services: Never  . Active Member of Clubs or Organizations: No  . Attends Archivist Meetings: Never  . Marital Status: Divorced  Human resources officer Violence: Unknown  . Fear of Current or Ex-Partner: No  . Emotionally Abused: No  . Physically Abused: No  . Sexually Abused: Not on file    FAMILY HISTORY: Family History  Problem Relation Age of Onset  . Diabetes Mother   . Heart attack Mother   . Heart attack Father   . Heart attack Brother   . Colon cancer Neg Hx   . Stomach cancer Neg Hx     ALLERGIES:  is allergic to caduet [amlodipine-atorvastatin].  MEDICATIONS:  Current Outpatient Medications  Medication Sig Dispense Refill  . acetaminophen (TYLENOL) 325 MG tablet Take 2 tablets (650 mg total) by mouth every 6 (six) hours as needed for mild pain (or Fever >/= 101). 12 tablet 0  . albuterol (VENTOLIN HFA) 108 (90 Base) MCG/ACT inhaler Inhale 2 puffs into the lungs every 6 (six) hours as needed for wheezing or shortness of breath. 18 g 0  . atorvastatin (LIPITOR) 40 MG tablet Take 40  mg by mouth at bedtime.    . cyanocobalamin (,VITAMIN B-12,) 1000 MCG/ML injection Inject 1,000 mcg into the muscle every 30 (thirty) days.     Marland Kitchen docusate sodium (COLACE) 100 MG capsule Take 1 capsule (100 mg total) by mouth daily. (Patient not taking: Reported on 08/06/2020) 60 capsule 1  . folic acid (FOLVITE) 1 MG tablet Take 1 tablet (1 mg total) by mouth daily. (Patient taking differently: Take 1 mg by mouth at bedtime. ) 30 tablet 2  . hydrochlorothiazide (HYDRODIURIL) 12.5 MG tablet Take 12.5 mg by mouth at bedtime.     . insulin NPH-regular Human (NOVOLIN 70/30) (70-30) 100 UNIT/ML injection Inject 50 Units into the skin in the morning and at bedtime. Inject 50 units in morning and 50 units every evening    . Insulin Pen Needle (B-D ULTRAFINE III SHORT  PEN) 31G X 8 MM MISC 1 each by Does not apply route as directed. 100 each 3  . INSULIN SYRINGE .5CC/29G 29G X 1/2" 0.5 ML MISC Use to inject insulin 2 times a day 100 each 3  . isosorbide mononitrate (IMDUR) 30 MG 24 hr tablet Take 1 tablet (30 mg total) by mouth daily. May take 2 if needed 180 tablet 3  . lisinopril (PRINIVIL,ZESTRIL) 20 MG tablet Take 1 tablet (20 mg total) by mouth daily. 30 tablet 6  . nitroGLYCERIN (NITROSTAT) 0.4 MG SL tablet Place 1 tablet (0.4 mg total) under the tongue every 5 (five) minutes as needed for chest pain. 25 tablet 3  . ONE TOUCH ULTRA TEST test strip TEST FOUR TIMES DAILY AS DIRECTED. 150 each 5  . pantoprazole (PROTONIX) 40 MG tablet Take 1 tablet (40 mg total) by mouth 2 (two) times daily before a meal. 60 tablet 5  . polyethylene glycol (MIRALAX / GLYCOLAX) 17 g packet Take 17 g by mouth daily. (Patient not taking: Reported on 08/06/2020) 28 each 1  . traMADol (ULTRAM) 50 MG tablet Take 50 mg by mouth in the morning and at bedtime.     Marland Kitchen zolpidem (AMBIEN) 10 MG tablet Take 10 mg by mouth at bedtime.      No current facility-administered medications for this visit.    REVIEW OF SYSTEMS:   Review of  Systems  Constitutional: Positive for fatigue (75%). Negative for appetite change.  Eyes: Negative for eye problems.  Respiratory: Positive for shortness of breath (w/ exertion).   Cardiovascular: Positive for leg swelling (frequent). Negative for chest pain.  Neurological: Negative for headaches and light-headedness.     PHYSICAL EXAMINATION: ECOG PERFORMANCE STATUS: 1 - Symptomatic but completely ambulatory  Vitals:   08/07/20 1530  BP: (!) 154/61  Pulse: 92  Resp: 20  Temp: (!) 97.3 F (36.3 C)  SpO2: 97%   Filed Weights   08/07/20 1530  Weight: 284 lb 1.6 oz (128.9 kg)   Physical Exam Vitals reviewed.  Constitutional:      Appearance: Normal appearance. He is obese.  Cardiovascular:     Rate and Rhythm: Normal rate and regular rhythm.     Pulses: Normal pulses.     Heart sounds: Normal heart sounds.  Pulmonary:     Effort: Pulmonary effort is normal.     Breath sounds: Normal breath sounds.  Abdominal:     Palpations: Abdomen is soft. There is no hepatomegaly or mass.     Tenderness: There is abdominal tenderness (tenderness 4-5 fingerbreaths below left costal margin) in the left upper quadrant.     Hernia: No hernia is present.  Musculoskeletal:     Right lower leg: Edema (trace) present.     Left lower leg: Edema (trace) present.  Lymphadenopathy:     Cervical: No cervical adenopathy.     Upper Body:     Right upper body: No supraclavicular adenopathy.     Left upper body: No supraclavicular adenopathy.  Neurological:     General: No focal deficit present.     Mental Status: He is alert and oriented to person, place, and time.  Psychiatric:        Mood and Affect: Mood normal.        Behavior: Behavior normal.      LABORATORY DATA:  I have reviewed the data as listed Recent Results (from the past 2160 hour(s))  CBC     Status: Abnormal   Collection Time:  07/30/20  6:27 AM  Result Value Ref Range   WBC 8.7 4.0 - 10.5 K/uL   RBC 3.88 (L) 4.22 -  5.81 MIL/uL   Hemoglobin 8.4 (L) 13.0 - 17.0 g/dL   HCT 30.6 (L) 39 - 52 %   MCV 78.9 (L) 80.0 - 100.0 fL   MCH 21.6 (L) 26.0 - 34.0 pg   MCHC 27.5 (L) 30.0 - 36.0 g/dL   RDW 19.8 (H) 11.5 - 15.5 %   Platelets 293 150 - 400 K/uL   nRBC 0.0 0.0 - 0.2 %    Comment: Performed at [facility], [address], [place], [place] [address]  Basic metabolic panel     Status: Abnormal   Collection Time: 07/30/20  6:27 AM  Result Value Ref Range   Sodium 135 135 - 145 mmol/L   Potassium 4.0 3.5 - 5.1 mmol/L   Chloride 101 98 - 111 mmol/L   CO2 26 22 - 32 mmol/L   Glucose, Bld 132 (H) 70 - 99 mg/dL    Comment: Glucose reference range applies only to samples taken after fasting for at least 8 hours.   BUN 15 8 - 23 mg/dL   Creatinine, Ser 1.24 0.61 - 1.24 mg/dL   Calcium 8.8 (L) 8.9 - 10.3 mg/dL   GFR, Estimated >60 >60 mL/min    Comment: (NOTE) Calculated using the CKD-EPI Creatinine Equation (2021)    Anion gap 8 5 - 15    Comment: Performed at [facility], [address] [place], [place] [address]  Magnesium     Status: None   Collection Time: 07/30/20  6:27 AM  Result Value Ref Range   Magnesium 2.4 1.7 - 2.4 mg/dL    Comment: Performed at [facility], [address], [place], [place] [address]  Glucose, capillary     Status: Abnormal   Collection Time: 07/30/20  7:27 AM  Result Value Ref Range   Glucose-Capillary 132 (H) 70 - 99 mg/dL    Comment: Glucose reference range applies only to samples taken after fasting for at least 8 hours.  Glucose, capillary     Status: Abnormal   Collection Time: 07/30/20 11:01 AM  Result Value Ref Range   Glucose-Capillary 135 (H) 70 - 99 mg/dL    Comment: Glucose reference range applies only to samples taken after fasting for at least 8 hours.    RADIOGRAPHIC STUDIES: I have personally reviewed the radiological images as listed and agreed with the findings in the report. DG Chest 2 View  Result Date: 07/22/2020 CLINICAL DATA:  69 year old male  with cough, shortness of breath, congested. COVID-19 status pending. EXAM: CHEST - 2 VIEW COMPARISON:  Chest radiographs 05/15/2014 and earlier. FINDINGS: PA and lateral views. Stable lung volumes and mediastinal contours, within normal limits. Visualized tracheal air column is within normal limits. No pneumothorax, pulmonary edema, pleural effusion or confluent pulmonary opacity. However, there is mildly increased interstitial opacity at both lung bases. No acute osseous abnormality identified. Negative visible bowel gas pattern. IMPRESSION: Mild increased interstitial opacity at both lung bases suspicious for acute viral/atypical respiratory infection in this setting. Electronically Signed   By: Genevie Ann M.D.   On: 07/22/2020 17:55   CT ABDOMEN PELVIS W CONTRAST  Result Date: 07/23/2020 CLINICAL DATA:  Nausea and abdominal pain since noon today EXAM: CT ABDOMEN AND PELVIS WITH CONTRAST TECHNIQUE: Multidetector CT imaging of the abdomen and pelvis was performed using the standard protocol following bolus administration of intravenous contrast. CONTRAST:  161mL OMNIPAQUE IOHEXOL 300 MG/ML  SOLN COMPARISON:  03/12/2015 FINDINGS: Lower chest: No acute pleural or parenchymal lung disease. Hepatobiliary: Calcified gallstones are identified without cholecystitis. The liver is unremarkable. No biliary dilation. Pancreas: Unremarkable. No pancreatic ductal dilatation or surrounding inflammatory changes. Spleen: Normal in size without focal abnormality. Adrenals/Urinary Tract: Interval enlargement of the left adrenal myelolipoma, now measuring 3.4 x 3.2 cm. The right adrenal is unremarkable. There is bilateral renal cortical thinning. Stable cyst upper pole left kidney. Otherwise the kidneys enhance normally and symmetrically. No urinary tract calculi or obstructive uropathy. Bladder is decompressed, limiting its evaluation. Stomach/Bowel: There is dilation of the proximal small bowel measuring up to 3.3 cm in diameter.  Scattered gas fluid levels are seen. There is abrupt transition within the mid jejunum in the central abdomen, reference image 63 of series 2. This is likely due to adhesion, given the adjacent postsurgical changes from hernia repair. Normal appendix right lower quadrant. Scattered diverticulosis of the distal colon, without diverticulitis. Vascular/Lymphatic: Aortic atherosclerosis. Pathologic adenopathy is seen within the pelvis, with a 17 mm short axis left external iliac lymph node seen on image 80. 18 mm short axis right external iliac node, reference image 82. There are multiple subcentimeter lymph nodes within the retroperitoneum and left para-aortic distribution. Reproductive: Prostate is not enlarged. Other: No free fluid or free gas. Postsurgical changes from previous midline ventral hernia repair. No residual or recurrent hernia. Musculoskeletal: There are no acute or destructive bony lesions. There is bilateral L5 spondylolysis, with grade 2 anterolisthesis of L5 on S1 and severe spondylosis. Reconstructed images demonstrate no additional findings. IMPRESSION: 1. Small-bowel obstruction, transition in the mid jejunum in the central abdomen as above. 2. Lymphadenopathy within the bilateral external iliac and retroperitoneal distributions as above, of uncertain etiology. 3. Cholelithiasis without cholecystitis. 4. Left adrenal myelolipoma, increased in size since prior study now measuring 3.4 cm. 5. 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 6.  Aortic Atherosclerosis (ICD10-I70.0). Electronically Signed   By: Randa Ngo M.D.   On: 07/23/2020 20:08   DG Abd 2 Views  Result Date: 07/27/2020 CLINICAL DATA:  69 year old male with abdominal pain, constipation and small-bowel obstruction EXAM: ABDOMEN - 2 VIEW COMPARISON:  Prior x-ray 07/24/2020 FINDINGS: No evidence of free air. The visualized lungs are essentially clear. Small amount of oral contrast material visualized  within the cecum and hepatic flexure of the colon. Some gas is present within minimally dilated loops of small bowel in the left hemiabdomen. Overall, the degree of small-bowel distention has significantly improved compared to the prior radiograph. No acute osseous abnormality. IMPRESSION: 1. Significant improvement in the appearance of small-bowel obstruction compared to 07/24/2020. Only a small amount of gas-filled and borderline dilated loops of small bowel are identified in the left hemiabdomen. 2. No evidence of free air. Electronically Signed   By: Jacqulynn Cadet M.D.   On: 07/27/2020 10:49   DG ABD ACUTE 2+V W 1V CHEST  Result Date: 07/24/2020 CLINICAL DATA:  Abdominal pain EXAM: DG ABDOMEN ACUTE WITH 1 VIEW CHEST COMPARISON:  CT 07/23/2020 FINDINGS: Minimal left basilar atelectasis. Lungs are otherwise clear. No pneumothorax or pleural effusion. Cardiac size within normal limits. Multiple gas-filled dilated loops of small bowel are seen within the mid abdomen, similar to that noted on prior CT examination. There is relatively little gas within the colon, decreased since prior CT examination. Together, the findings are in keeping with a developing mid to distal small bowel obstruction. No  free intraperitoneal gas. No abnormal calcifications within the abdomen. Osseous structures are unremarkable. IMPRESSION: Developing mid to distal small bowel obstruction.  No free air. Electronically Signed   By: Fidela Salisbury MD   On: 07/24/2020 20:03   DG Abd Portable 1V-Small Bowel Obstruction Protocol-initial, 8 hr delay  Result Date: 07/28/2020 CLINICAL DATA:  Bowel obstruction.  8 hour delay. EXAM: PORTABLE ABDOMEN - 1 VIEW COMPARISON:  July 28, 2020 FINDINGS: Oral contrast is noted in the small bowel and colon. No definite oral contrast at the level of the rectum. The small bowel loops appear to be nondilated. There is no free air. IMPRESSION: Nonobstructive bowel gas pattern. Oral contrast is noted  in the small bowel and colon. No definite oral contrast at the level of the rectum. Electronically Signed   By: Constance Holster M.D.   On: 07/28/2020 19:54   DG Abd Portable 1V  Result Date: 07/28/2020 CLINICAL DATA:  Follow-up small-bowel obstruction protocol EXAM: PORTABLE ABDOMEN - 1 VIEW COMPARISON:  07/27/2020 FINDINGS: Contrast previously largely present within the right colon has now progressed at least to the splenic flexure. No dilated small bowel loops are visible. IMPRESSION: Progression of contrast to the splenic flexure. Electronically Signed   By: Nelson Chimes M.D.   On: 07/28/2020 13:22   ECHOCARDIOGRAM COMPLETE  Result Date: 07/24/2020    ECHOCARDIOGRAM REPORT   [name] Date of Exam: 07/24/2020 Medical Rec #:  [id number]     Height:       72.0 in Accession #:    [id number]    Weight:       293.2 lb Date of Birth:  August 24, 1951     BSA:          2.507 m Patient Age:    54 years      BP:           138/67 mmHg Patient Gender: M             HR:           102 bpm. Exam Location:  [name] Procedure: 2D Echo and Intracardiac Opacification Agent Indications:    Chest Pain 786.50 / R07.9  History:        Patient has prior history of Echocardiogram examinations, most                 recent 09/08/2018. CHF, COPD; Risk Factors:Current Smoker,                 Diabetes, Hypertension and Dyslipidemia.  Sonographer:    Leavy Cella RDCS (AE) Referring Phys: [place]  1. Left ventricular ejection fraction, by estimation, is 55 to 60%. The left ventricle has normal function. The left ventricle has no regional wall motion abnormalities. There is moderate left ventricular hypertrophy. Left ventricular diastolic parameters are indeterminate.  2. Right ventricular systolic function is normal. The right ventricular size is normal.  3. The mitral valve is normal in structure. Mild mitral valve regurgitation. No evidence of mitral stenosis.  4. The aortic valve is  tricuspid. There is mild calcification of the aortic valve. There is mild thickening of the aortic valve. Aortic valve regurgitation is not visualized. No aortic stenosis is present.  5. The inferior vena cava is normal in size with greater than 50% respiratory variability, suggesting right atrial pressure of 3 mmHg. FINDINGS  Left Ventricle: Left ventricular ejection fraction, by estimation, is 55 to 60%. The left ventricle  has normal function. The left ventricle has no regional wall motion abnormalities. Definity contrast agent was given IV to delineate the left ventricular  endocardial borders. The left ventricular internal cavity size was normal in size. There is moderate left ventricular hypertrophy. Left ventricular diastolic parameters are indeterminate. Right Ventricle: The right ventricular size is normal. No increase in right ventricular wall thickness. Right ventricular systolic function is normal. Left Atrium: Left atrial size was normal in size. Right Atrium: Right atrial size was normal in size. Pericardium: There is no evidence of pericardial effusion. Mitral Valve: The mitral valve is normal in structure. There is mild thickening of the mitral valve leaflet(s). There is mild calcification of the mitral valve leaflet(s). Mild mitral annular calcification. Mild mitral valve regurgitation. No evidence of  mitral valve stenosis. Tricuspid Valve: The tricuspid valve is normal in structure. Tricuspid valve regurgitation is not demonstrated. No evidence of tricuspid stenosis. Aortic Valve: The aortic valve is tricuspid. There is mild calcification of the aortic valve. There is mild thickening of the aortic valve. There is mild aortic valve annular calcification. Aortic valve regurgitation is not visualized. No aortic stenosis  is present. Aortic valve mean gradient measures 5.5 mmHg. Aortic valve peak gradient measures 10.9 mmHg. Aortic valve area, by VTI measures 2.48 cm. Pulmonic Valve: The pulmonic  valve was not well visualized. Pulmonic valve regurgitation is not visualized. No evidence of pulmonic stenosis. Aorta: The aortic root is normal in size and structure. Venous: The inferior vena cava is normal in size with greater than 50% respiratory variability, suggesting right atrial pressure of 3 mmHg. IAS/Shunts: No atrial level shunt detected by color flow Doppler.  LEFT VENTRICLE PLAX 2D LVIDd:         4.96 cm  Diastology LVIDs:         4.14 cm  LV e' medial:    8.92 cm/s LV PW:         1.50 cm  LV E/e' medial:  14.9 LV IVS:        1.55 cm  LV e' lateral:   13.60 cm/s LVOT diam:     2.00 cm  LV E/e' lateral: 9.8 LV SV:         76 LV SV Index:   30 LVOT Area:     3.14 cm  RIGHT VENTRICLE RV S prime:     16.40 cm/s TAPSE (M-mode): 2.4 cm LEFT ATRIUM             Index       RIGHT ATRIUM           Index LA diam:        4.50 cm 1.80 cm/m  RA Area:     16.90 cm LA Vol (A2C):   61.2 ml 24.41 ml/m RA Volume:   45.90 ml  18.31 ml/m LA Vol (A4C):   60.8 ml 24.25 ml/m LA Biplane Vol: 67.6 ml 26.97 ml/m  AORTIC VALVE AV Area (Vmax):    2.04 cm AV Area (Vmean):   1.97 cm AV Area (VTI):     2.48 cm AV Vmax:           165.30 cm/s AV Vmean:          108.114 cm/s AV VTI:            0.305 m AV Peak Grad:      10.9 mmHg AV Mean Grad:      5.5 mmHg LVOT Vmax:  107.40 cm/s LVOT Vmean:        67.714 cm/s LVOT VTI:          0.241 m LVOT/AV VTI ratio: 0.79  AORTA Ao Root diam: 2.90 cm MITRAL VALVE MV Area (PHT): 5.79 cm     SHUNTS MV Decel Time: 131 msec     Systemic VTI:  0.24 m MR Peak grad: 112.4 mmHg    Systemic Diam: 2.00 cm MR Mean grad: 79.0 mmHg MR Vmax:      530.00 cm/s MR Vmean:     426.0 cm/s MV E velocity: 133.00 cm/s MV A velocity: 129.00 cm/s MV E/A ratio:  1.03 Carlyle Dolly MD Electronically signed by Carlyle Dolly MD Signature Date/Time: 07/24/2020/12:04:52 PM    Final     ASSESSMENT:  1.  Iron deficiency anemia: -Patient had admission in February 2021 with hemoglobin 5.4, status post 3  units PRBC. -Admission on 07/22/2020 with hemoglobin 6.2, 2 units PRBC transfusion. -History of melena, stool positive for occult blood.  No bright blood per rectum. 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 -Cannot tolerate iron pills secondary to constipation. -Feraheme on 05/22/2020 on 05/29/2020.  2.  Pelvic and retroperitoneal adenopathy: -CT AP done for abdominal pain on 07/23/2020 showed pathologic adenopathy in the pelvis, 17 mm short axis left external iliac lymph node and 18 mm short axis right external iliac lymph node and multiple subcentimeter lymph nodes in the retroperitoneum and left para-aortic distribution.    PLAN:  1.  Iron deficiency anemia: -Recent hemoglobin 8.4 on 07/30/2020 with MCV 78.9.  Ferritin was 4 and percent saturation 3 on 07/22/2020. -Recommend checking CBC today and check for other nutritional deficiencies including folic acid, copper, methylmalonic acid. -We will also check SPEP, LDH, reticulocyte count. -Recommend Feraheme weekly x2 because of his continuous blood loss. -RTC 4 to 6 weeks.  2.  Pelvic and retroperitoneal adenopathy: -CT abdomen and pelvis done for abdominal pain on 07/23/2020 showed pathologic adenopathy in the pelvis, 17 mm short axis left external iliac lymph node and 18 mm short axis right external iliac lymph node and multiple subcentimeter lymph nodes in the retroperitoneum and left para-aortic distribution. -He does not report any major B symptoms but lost about 10 pounds during last hospitalization. -We will check PSA. -Recommend PET CT scan to further evaluate these nodes.    All questions were answered. The patient knows to call the clinic with any problems, questions or concerns.   Derek Jack, MD 08/07/20 4:05 PM  [place] [phone]   I, Milinda Antis, am  acting as a scribe for Dr. Sanda Linger.  I, Derek Jack MD, have reviewed the above documentation for accuracy and completeness, and I agree with the above.

## 2020-08-09 LAB — PROTEIN ELECTROPHORESIS, SERUM
A/G Ratio: 1.1 (ref 0.7–1.7)
Albumin ELP: 3.4 g/dL (ref 2.9–4.4)
Alpha-1-Globulin: 0.3 g/dL (ref 0.0–0.4)
Alpha-2-Globulin: 0.8 g/dL (ref 0.4–1.0)
Beta Globulin: 0.9 g/dL (ref 0.7–1.3)
Gamma Globulin: 1.2 g/dL (ref 0.4–1.8)
Globulin, Total: 3.2 g/dL (ref 2.2–3.9)
Total Protein ELP: 6.6 g/dL (ref 6.0–8.5)

## 2020-08-12 ENCOUNTER — Encounter (HOSPITAL_COMMUNITY): Payer: Self-pay | Admitting: *Deleted

## 2020-08-12 NOTE — Progress Notes (Signed)
Per Dr. Delton Coombes, patient contacted about his most recent labs.  Folate was 5.2.  Dr. Delton Coombes wants patient to take folic acid 1 mg daily.  Patient advised and verbalizes understanding.

## 2020-08-13 ENCOUNTER — Encounter (HOSPITAL_COMMUNITY): Payer: Self-pay

## 2020-08-13 ENCOUNTER — Other Ambulatory Visit: Payer: Self-pay

## 2020-08-13 ENCOUNTER — Inpatient Hospital Stay (HOSPITAL_COMMUNITY): Payer: Medicare Other

## 2020-08-13 VITALS — BP 141/50 | HR 102 | Temp 97.4°F | Resp 20

## 2020-08-13 DIAGNOSIS — J449 Chronic obstructive pulmonary disease, unspecified: Secondary | ICD-10-CM | POA: Diagnosis not present

## 2020-08-13 DIAGNOSIS — I700 Atherosclerosis of aorta: Secondary | ICD-10-CM | POA: Diagnosis not present

## 2020-08-13 DIAGNOSIS — R10819 Abdominal tenderness, unspecified site: Secondary | ICD-10-CM | POA: Diagnosis not present

## 2020-08-13 DIAGNOSIS — D509 Iron deficiency anemia, unspecified: Secondary | ICD-10-CM

## 2020-08-13 DIAGNOSIS — K8020 Calculus of gallbladder without cholecystitis without obstruction: Secondary | ICD-10-CM | POA: Diagnosis not present

## 2020-08-13 DIAGNOSIS — K921 Melena: Secondary | ICD-10-CM | POA: Diagnosis not present

## 2020-08-13 DIAGNOSIS — I110 Hypertensive heart disease with heart failure: Secondary | ICD-10-CM | POA: Diagnosis not present

## 2020-08-13 DIAGNOSIS — K2980 Duodenitis without bleeding: Secondary | ICD-10-CM | POA: Diagnosis not present

## 2020-08-13 DIAGNOSIS — I509 Heart failure, unspecified: Secondary | ICD-10-CM | POA: Diagnosis not present

## 2020-08-13 DIAGNOSIS — I340 Nonrheumatic mitral (valve) insufficiency: Secondary | ICD-10-CM | POA: Diagnosis not present

## 2020-08-13 DIAGNOSIS — R0602 Shortness of breath: Secondary | ICD-10-CM | POA: Diagnosis not present

## 2020-08-13 DIAGNOSIS — M7989 Other specified soft tissue disorders: Secondary | ICD-10-CM | POA: Diagnosis not present

## 2020-08-13 DIAGNOSIS — D1779 Benign lipomatous neoplasm of other sites: Secondary | ICD-10-CM | POA: Diagnosis not present

## 2020-08-13 DIAGNOSIS — R5383 Other fatigue: Secondary | ICD-10-CM | POA: Diagnosis not present

## 2020-08-13 DIAGNOSIS — E119 Type 2 diabetes mellitus without complications: Secondary | ICD-10-CM | POA: Diagnosis not present

## 2020-08-13 DIAGNOSIS — K5730 Diverticulosis of large intestine without perforation or abscess without bleeding: Secondary | ICD-10-CM | POA: Diagnosis not present

## 2020-08-13 DIAGNOSIS — M1990 Unspecified osteoarthritis, unspecified site: Secondary | ICD-10-CM | POA: Diagnosis not present

## 2020-08-13 DIAGNOSIS — R590 Localized enlarged lymph nodes: Secondary | ICD-10-CM | POA: Diagnosis not present

## 2020-08-13 DIAGNOSIS — M4317 Spondylolisthesis, lumbosacral region: Secondary | ICD-10-CM | POA: Diagnosis not present

## 2020-08-13 DIAGNOSIS — K56609 Unspecified intestinal obstruction, unspecified as to partial versus complete obstruction: Secondary | ICD-10-CM | POA: Diagnosis not present

## 2020-08-13 DIAGNOSIS — R609 Edema, unspecified: Secondary | ICD-10-CM | POA: Diagnosis not present

## 2020-08-13 DIAGNOSIS — R079 Chest pain, unspecified: Secondary | ICD-10-CM | POA: Diagnosis not present

## 2020-08-13 LAB — COPPER, SERUM: Copper: 121 ug/dL (ref 69–132)

## 2020-08-13 MED ORDER — SODIUM CHLORIDE 0.9 % IV SOLN
510.0000 mg | Freq: Once | INTRAVENOUS | Status: AC
  Administered 2020-08-13: 510 mg via INTRAVENOUS
  Filled 2020-08-13: qty 17

## 2020-08-13 MED ORDER — SODIUM CHLORIDE 0.9 % IV SOLN: Freq: Once | INTRAVENOUS | Status: AC

## 2020-08-13 NOTE — Patient Instructions (Signed)
[facility] at [facility] Discharge Instructions  Received Feraheme infusion today. Follow-up as scheduled   Thank you for choosing [facility] at [facility] to provide your oncology and hematology care.  To afford each patient quality time with our provider, please arrive at least 15 minutes before your scheduled appointment time.   If you have a lab appointment with the Cancer Center please come in thru the Main Entrance and check in at the main information desk.  You need to re-schedule your appointment should you arrive 10 or more minutes late.  We strive to give you quality time with our providers, and arriving late affects you and other patients whose appointments are after yours.  Also, if you no show three or more times for appointments you may be dismissed from the clinic at the providers discretion.     Again, thank you for choosing [facility].  Our hope is that these requests will decrease the amount of time that you wait before being seen by our physicians.       _____________________________________________________________  Should you have questions after your visit to [facility], please contact our office at [phone] and follow the prompts.  Our office hours are 8:00 a.m. and 4:30 p.m. Monday - Friday.  Please note that voicemails left after 4:00 p.m. may not be returned until the following business day.  We are closed weekends and major holidays.  You do have access to a nurse 24-7, just call the main number to the clinic [phone] and do not press any options, hold on the line and a nurse will answer the phone.    For prescription refill requests, have your pharmacy contact our office and allow 72 hours.    Due to Covid, you will need to wear a mask upon entering the hospital. If you do not have a mask, a mask will be given to you at the Main Entrance upon arrival. For doctor visits, patients may have  1 support person age 18 or older with them. For treatment visits, patients can not have anyone with them due to social distancing guidelines and our immunocompromised population.     

## 2020-08-13 NOTE — Progress Notes (Signed)
Gregory Alvarado tolerated Feraheme infusion well without complaints or incident. Peripheral IV site checked with positive blood return noted prior to and after infusion. VSS upon discharge. Pt discharged via wheelchair in satisfactory condition

## 2020-08-14 DIAGNOSIS — D509 Iron deficiency anemia, unspecified: Secondary | ICD-10-CM | POA: Diagnosis not present

## 2020-08-14 DIAGNOSIS — E782 Mixed hyperlipidemia: Secondary | ICD-10-CM | POA: Diagnosis not present

## 2020-08-14 DIAGNOSIS — J449 Chronic obstructive pulmonary disease, unspecified: Secondary | ICD-10-CM | POA: Diagnosis not present

## 2020-08-14 DIAGNOSIS — E1165 Type 2 diabetes mellitus with hyperglycemia: Secondary | ICD-10-CM | POA: Diagnosis not present

## 2020-08-14 DIAGNOSIS — I10 Essential (primary) hypertension: Secondary | ICD-10-CM | POA: Diagnosis not present

## 2020-08-14 LAB — METHYLMALONIC ACID, SERUM: Methylmalonic Acid, Quantitative: 331 nmol/L (ref 0–378)

## 2020-08-19 ENCOUNTER — Encounter (HOSPITAL_COMMUNITY)
Admission: RE | Admit: 2020-08-19 | Discharge: 2020-08-19 | Disposition: A | Discharge status: Home / Self Care | Payer: Medicare Other | Source: Ambulatory Visit | Attending: Hematology | Admitting: Hematology

## 2020-08-19 ENCOUNTER — Other Ambulatory Visit: Payer: Self-pay

## 2020-08-19 DIAGNOSIS — D500 Iron deficiency anemia secondary to blood loss (chronic): Secondary | ICD-10-CM | POA: Diagnosis not present

## 2020-08-19 DIAGNOSIS — D3500 Benign neoplasm of unspecified adrenal gland: Secondary | ICD-10-CM | POA: Diagnosis not present

## 2020-08-19 DIAGNOSIS — D1779 Benign lipomatous neoplasm of other sites: Secondary | ICD-10-CM | POA: Diagnosis not present

## 2020-08-19 MED ORDER — FLUDEOXYGLUCOSE F - 18 (FDG) INJECTION
16.2000 | Freq: Once | INTRAVENOUS | Status: AC | PRN
  Administered 2020-08-19: 16.2 via INTRAVENOUS

## 2020-08-20 ENCOUNTER — Inpatient Hospital Stay (HOSPITAL_COMMUNITY): Payer: Medicare Other | Attending: Hematology

## 2020-08-20 ENCOUNTER — Encounter (HOSPITAL_COMMUNITY): Payer: Self-pay

## 2020-08-20 VITALS — BP 152/77 | HR 87 | Temp 97.9°F | Resp 20

## 2020-08-20 DIAGNOSIS — R3589 Other polyuria: Secondary | ICD-10-CM | POA: Diagnosis not present

## 2020-08-20 DIAGNOSIS — I340 Nonrheumatic mitral (valve) insufficiency: Secondary | ICD-10-CM | POA: Insufficient documentation

## 2020-08-20 DIAGNOSIS — Z8249 Family history of ischemic heart disease and other diseases of the circulatory system: Secondary | ICD-10-CM | POA: Insufficient documentation

## 2020-08-20 DIAGNOSIS — K56609 Unspecified intestinal obstruction, unspecified as to partial versus complete obstruction: Secondary | ICD-10-CM | POA: Insufficient documentation

## 2020-08-20 DIAGNOSIS — Z833 Family history of diabetes mellitus: Secondary | ICD-10-CM | POA: Insufficient documentation

## 2020-08-20 DIAGNOSIS — K8020 Calculus of gallbladder without cholecystitis without obstruction: Secondary | ICD-10-CM | POA: Diagnosis not present

## 2020-08-20 DIAGNOSIS — D509 Iron deficiency anemia, unspecified: Secondary | ICD-10-CM

## 2020-08-20 DIAGNOSIS — I110 Hypertensive heart disease with heart failure: Secondary | ICD-10-CM | POA: Insufficient documentation

## 2020-08-20 DIAGNOSIS — G4700 Insomnia, unspecified: Secondary | ICD-10-CM | POA: Insufficient documentation

## 2020-08-20 DIAGNOSIS — I509 Heart failure, unspecified: Secondary | ICD-10-CM | POA: Insufficient documentation

## 2020-08-20 DIAGNOSIS — I2510 Atherosclerotic heart disease of native coronary artery without angina pectoris: Secondary | ICD-10-CM | POA: Insufficient documentation

## 2020-08-20 DIAGNOSIS — R5383 Other fatigue: Secondary | ICD-10-CM | POA: Insufficient documentation

## 2020-08-20 DIAGNOSIS — M7989 Other specified soft tissue disorders: Secondary | ICD-10-CM | POA: Diagnosis not present

## 2020-08-20 DIAGNOSIS — D500 Iron deficiency anemia secondary to blood loss (chronic): Secondary | ICD-10-CM | POA: Diagnosis not present

## 2020-08-20 DIAGNOSIS — J449 Chronic obstructive pulmonary disease, unspecified: Secondary | ICD-10-CM | POA: Diagnosis not present

## 2020-08-20 DIAGNOSIS — R0602 Shortness of breath: Secondary | ICD-10-CM | POA: Diagnosis not present

## 2020-08-20 DIAGNOSIS — R079 Chest pain, unspecified: Secondary | ICD-10-CM | POA: Insufficient documentation

## 2020-08-20 DIAGNOSIS — F17210 Nicotine dependence, cigarettes, uncomplicated: Secondary | ICD-10-CM | POA: Diagnosis not present

## 2020-08-20 DIAGNOSIS — R590 Localized enlarged lymph nodes: Secondary | ICD-10-CM | POA: Diagnosis not present

## 2020-08-20 DIAGNOSIS — M4317 Spondylolisthesis, lumbosacral region: Secondary | ICD-10-CM | POA: Insufficient documentation

## 2020-08-20 DIAGNOSIS — Z79899 Other long term (current) drug therapy: Secondary | ICD-10-CM | POA: Insufficient documentation

## 2020-08-20 DIAGNOSIS — E119 Type 2 diabetes mellitus without complications: Secondary | ICD-10-CM | POA: Insufficient documentation

## 2020-08-20 DIAGNOSIS — K922 Gastrointestinal hemorrhage, unspecified: Secondary | ICD-10-CM | POA: Diagnosis not present

## 2020-08-20 DIAGNOSIS — Q278 Other specified congenital malformations of peripheral vascular system: Secondary | ICD-10-CM | POA: Insufficient documentation

## 2020-08-20 MED ORDER — SODIUM CHLORIDE 0.9 % IV SOLN
510.0000 mg | Freq: Once | INTRAVENOUS | Status: AC
  Administered 2020-08-20: 510 mg via INTRAVENOUS
  Filled 2020-08-20: qty 510

## 2020-08-20 MED ORDER — SODIUM CHLORIDE 0.9 % IV SOLN: Freq: Once | INTRAVENOUS | Status: AC

## 2020-08-20 NOTE — Progress Notes (Signed)
Tolerated iron infusion well today.  Refused to wait 30 minute post iron per policy.  Discharegd via wheelchair in stable condition.  Vital signs stable prior to discharge.

## 2020-08-20 NOTE — Patient Instructions (Signed)
[facility] at [facility]  Discharge Instructions:   _______________________________________________________________  Thank you for choosing [facility] at [facility] to provide your oncology and hematology care.  To afford each patient quality time with our providers, please arrive at least 15 minutes before your scheduled appointment.  You need to re-schedule your appointment if you arrive 10 or more minutes late.  We strive to give you quality time with our providers, and arriving late affects you and other patients whose appointments are after yours.  Also, if you no show three or more times for appointments you may be dismissed from the clinic.  Again, thank you for choosing [facility] at [facility]. Our hope is that these requests will allow you access to exceptional care and in a timely manner. _______________________________________________________________  If you have questions after your visit, please contact our office at [phone] between the hours of 8:30 a.m. and 5:00 p.m. Voicemails left after 4:30 p.m. will not be returned until the following business day. _______________________________________________________________  For prescription refill requests, have your pharmacy contact our office. _______________________________________________________________  Recommendations made by the consultant and any test results will be sent to your referring physician. _______________________________________________________________ 

## 2020-08-20 NOTE — Progress Notes (Signed)
Feraheme infusion today. Patient declined to wait post Feraheme for 30 minutes per policy. Patient teaching performed. Understanding verbalized and patient left unit without waiting 30 minutes post Feraheme infusion. Tolerated infusion without adverse affects. Vital signs stable. No complaints at this time. Discharged from clinic ambulatory in stable condition. Alert and oriented x 3. F/U with [facility] as scheduled.

## 2020-08-22 NOTE — Progress Notes (Signed)
[facility] [address], [place] [address]   CLINIC:   Medical Oncology/Hematology  Patient Care Team: Celene Squibb, MD as PCP - General (Internal Medicine) Harl Bowie, Alphonse Guild, MD as PCP - Cardiology (Cardiology) Danie Binder, MD (Inactive) as Consulting Physician (Gastroenterology) [name], DO as Consulting Physician (Internal Medicine)  CHIEF COMPLAINTS/PURPOSE OF CONSULTATION:   Evaluation of iron deficiency anemia  HISTORY OF PRESENTING ILLNESS:   Gregory Alvarado 69 y.o. male is here because of evaluation of iron deficiency anemia, at the request of Dr. Allyn Kenner. He was thought to have anemia secondary to GI blood loss. He comes to see Dr Raliegh Ip for periodic FU and monitoring of IDA. He is doing well today, tolerated iron well last week. No bleeding noted in his stool. No melena. No heart burn. Taking all his medications as prescribed. He complains of some wheezing and polyuria at night,.  MEDICAL HISTORY:  Past Medical History:  Diagnosis Date   Anemia    Arthritis    left shoulder   COPD (chronic obstructive pulmonary disease) (HCC)    Diabetes mellitus without complication ([facility])    Dyslipidemia    Hypertension    Insomnia    Insomnia    Morbid obesity ([facility])    Tobacco use     SURGICAL HISTORY: Past Surgical History:  Procedure Laterality Date   BIOPSY  10/28/2019   Procedure: BIOPSY;  Surgeon: Danie Binder, MD;  [facility];  Service: Endoscopy;;  gastric   BIOPSY  04/15/2020   Procedure: BIOPSY;  Surgeon: Daneil Dolin, MD;  [facility]: [facility];  Service: Endoscopy;;  gastric   COLON RESECTION  1990s?   DIVERTICULITIS   COLONOSCOPY WITH PROPOFOL N/A 04/15/2020   Rourk: Poor colon prep. two 6 to 8 mm polyps removed from the hepatic flexure, tubular adenomas.  Repeat colonoscopy later this year.   ESOPHAGOGASTRODUODENOSCOPY (EGD) WITH PROPOFOL N/A 10/28/2019   Dr. Oneida Alar: Multiple cratered gastric  ulcers, gastritis/duodenitis.  Path showed H. pylori.  Patient treated with amoxicillin/Biaxin/PPI twice daily.   ESOPHAGOGASTRODUODENOSCOPY (EGD) WITH PROPOFOL N/A 04/15/2020   Rourk: Patchy gastric erythema status post biopsy to document eradication of H. pylori.  Biopsy showed persistent H. pylori.  Previously noted gastric ulcer is completely healed.   ESOPHAGOGASTRODUODENOSCOPY (EGD) WITH PROPOFOL N/A 07/23/2020   Procedure: ESOPHAGOGASTRODUODENOSCOPY (EGD) WITH PROPOFOL;  Surgeon: Harvel Quale, MD;  [facility];  Service: Gastroenterology;  Laterality: N/A;   HERNIA REPAIR     UHR   MALONEY DILATION N/A 04/15/2020   Procedure: MALONEY DILATION;  Surgeon: Daneil Dolin, MD;  [facility];  Service: Endoscopy;  Laterality: N/A;   POLYPECTOMY  04/15/2020   Procedure: POLYPECTOMY;  Surgeon: Daneil Dolin, MD;  [facility];  Service: Endoscopy;;  colon   ROTATOR CUFF REPAIR Bilateral    TYMPANOPLASTY      SOCIAL HISTORY: Social History   Socioeconomic History   Marital status: Divorced    Spouse name: Not on file   Number of children: 2   Years of education: Not on file   Highest education level: Not on file  Occupational History   Occupation: Retired  Tobacco Use   Smoking status: Current Every Day Smoker    Packs/day: 2.00    Types: Cigarettes   Smokeless tobacco: Never Used  Vaping Use   Vaping Use: Never used  Substance and Sexual Activity   Alcohol  use: Not Currently    Comment: rare   Drug use: No   Sexual activity: Not on file  Other Topics Concern   Not on file  Social History Narrative   Not on file   Social Determinants of Health   Financial Resource Strain: Low Risk    Difficulty of Paying Living Expenses: Not very hard  Food Insecurity: No Food Insecurity   Worried About Running Out of Food in the Last Year: Never true   Ran Out of Food in the Last Year: Never true  Transportation Needs:  No Transportation Needs   Lack of Transportation (Medical): No   Lack of Transportation (Non-Medical): No  Physical Activity: Inactive   Days of Exercise per Week: 0 days   Minutes of Exercise per Session: 0 min  Stress: No Stress Concern Present   Feeling of Stress : Not at all  Social Connections: Socially Isolated   Frequency of Communication with Friends and Family: Three times a week   Frequency of Social Gatherings with Friends and Family: Three times a week   Attends Religious Services: Never   Active Member of Clubs or Organizations: No   Attends Music therapist: Never   Marital Status: Divorced  Human resources officer Violence: Unknown   Fear of Current or Ex-Partner: No   Emotionally Abused: No   Physically Abused: No   Sexually Abused: Not on file    FAMILY HISTORY: Family History  Problem Relation Age of Onset   Diabetes Mother    Heart attack Mother    Heart attack Father    Heart attack Brother    Colon cancer Neg Hx    Stomach cancer Neg Hx     ALLERGIES:  is allergic to caduet [amlodipine-atorvastatin].  MEDICATIONS:  Current Outpatient Medications  Medication Sig Dispense Refill   acetaminophen (TYLENOL) 325 MG tablet Take 2 tablets (650 mg total) by mouth every 6 (six) hours as needed for mild pain (or Fever >/= 101). 12 tablet 0   albuterol (VENTOLIN HFA) 108 (90 Base) MCG/ACT inhaler Inhale 2 puffs into the lungs every 6 (six) hours as needed for wheezing or shortness of breath. 18 g 0   atorvastatin (LIPITOR) 40 MG tablet Take 40 mg by mouth at bedtime.     cyanocobalamin (,VITAMIN B-12,) 1000 MCG/ML injection Inject 1,000 mcg into the muscle every 30 (thirty) days.      docusate sodium (COLACE) 100 MG capsule Take 1 capsule (100 mg total) by mouth daily. 60 capsule 1   folic acid (FOLVITE) 1 MG tablet Take 1 tablet (1 mg total) by mouth daily. (Patient taking differently: Take 1 mg by mouth at bedtime.) 30 tablet 2    gabapentin (NEURONTIN) 100 MG capsule Take 100 mg by mouth daily.     hydrochlorothiazide (HYDRODIURIL) 12.5 MG tablet Take 12.5 mg by mouth at bedtime.      insulin NPH-regular Human (NOVOLIN 70/30) (70-30) 100 UNIT/ML injection Inject 50 Units into the skin in the morning and at bedtime. Inject 50 units in morning and 50 units every evening     Insulin Pen Needle (B-D ULTRAFINE III SHORT PEN) 31G X 8 MM MISC 1 each by Does not apply route as directed. 100 each 3   INSULIN SYRINGE .5CC/29G 29G X 1/2" 0.5 ML MISC Use to inject insulin 2 times a day 100 each 3   isosorbide mononitrate (IMDUR) 30 MG 24 hr tablet Take 1 tablet (30 mg total) by mouth daily. May take  2 if needed 180 tablet 3   lisinopril (PRINIVIL,ZESTRIL) 20 MG tablet Take 1 tablet (20 mg total) by mouth daily. 30 tablet 6   naproxen (NAPROSYN) 500 MG tablet Take 500 mg by mouth 2 (two) times daily.     nitroGLYCERIN (NITROSTAT) 0.4 MG SL tablet Place 1 tablet (0.4 mg total) under the tongue every 5 (five) minutes as needed for chest pain. 25 tablet 3   ONE TOUCH ULTRA TEST test strip TEST FOUR TIMES DAILY AS DIRECTED. 150 each 5   pantoprazole (PROTONIX) 40 MG tablet Take 1 tablet (40 mg total) by mouth 2 (two) times daily before a meal. 60 tablet 5   polyethylene glycol (MIRALAX / GLYCOLAX) 17 g packet Take 17 g by mouth daily. 28 each 1   traMADol (ULTRAM) 50 MG tablet Take 50 mg by mouth in the morning and at bedtime.      zolpidem (AMBIEN) 10 MG tablet Take 10 mg by mouth at bedtime.     No current facility-administered medications for this visit.    REVIEW OF SYSTEMS:    Review of Systems  Constitutional: Positive for fatigue (75%). Negative for appetite change.  Eyes: Negative for eye problems.  Respiratory: Positive for shortness of breath (w/ exertion, wheezing).   Cardiovascular: Positive for leg swelling (frequent). Negative for chest pain.  Gastrointestinal: Positive for constipation.  Neurological:  Negative for headaches and light-headedness.   PHYSICAL EXAMINATION:  ECOG PERFORMANCE STATUS: 1 - Symptomatic but completely ambulatory  Vitals:   08/23/20 0800  BP: (!) 153/62  Pulse: 91  Resp: 18  Temp: 98.1 F (36.7 C)  SpO2: 96%   There were no vitals filed for this visit.   Physical Exam Vitals reviewed.  Constitutional:      Appearance: Normal appearance. He is obese.  Cardiovascular:     Rate and Rhythm: Normal rate and regular rhythm.     Pulses: Normal pulses.     Heart sounds: Normal heart sounds.  Pulmonary:     Effort: Pulmonary effort is normal.     Breath sounds: Normal breath sounds.  Chest:  Breasts:     Right: No supraclavicular adenopathy.     Left: No supraclavicular adenopathy.    Abdominal:     Palpations: Abdomen is soft. There is no hepatomegaly or mass.     Tenderness: There is no abdominal tenderness.     Hernia: No hernia is present.  Musculoskeletal:     Right lower leg: Edema (trace) present.     Left lower leg: Edema (trace) present.  Lymphadenopathy:     Cervical: No cervical adenopathy.     Upper Body:     Right upper body: No supraclavicular adenopathy.     Left upper body: No supraclavicular adenopathy.  Neurological:     General: No focal deficit present.     Mental Status: He is alert and oriented to person, place, and time.  Psychiatric:        Mood and Affect: Mood normal.        Behavior: Behavior normal.    LABORATORY DATA:  I have reviewed the data as listed  I have reviewed labs from 08/07/20  RADIOGRAPHIC STUDIES: I have personally reviewed the radiological images as listed and agreed with the findings in the report. NM PET Image Initial (PI) Skull Base To Thigh  Result Date: 08/20/2020 CLINICAL DATA:  Initial treatment strategy for pelvic adenopathy and anemia. EXAM: NUCLEAR MEDICINE PET SKULL BASE TO THIGH TECHNIQUE: 16.2  mCi F-18 FDG was injected intravenously. Full-ring PET imaging was performed from the  skull base to thigh after the radiotracer. CT data was obtained and used for attenuation correction and anatomic localization. Fasting blood glucose: 83 mg/dl COMPARISON:  CT scan 07/23/2020 FINDINGS: Mediastinal blood pool activity: SUV max 2.4 Liver activity: SUV max 3.2 NECK: Right level IB lymph node approximately 0.7 cm in short axis on image 49 series 3, maximum SUV 2.3. This lymph node is not pathologically enlarged and is below blood pool activity. Incidental CT findings: Left mastoidectomy. CHEST: No significant abnormal hypermetabolic activity in this region. Incidental CT findings: Aberrant right subclavian artery passes behind the esophagus. Coronary, aortic arch, and branch vessel atherosclerotic vascular disease. Mild cardiomegaly. ABDOMEN/PELVIS: Left external iliac node 1.7 cm in short axis on image 285 of series 3, maximum SUV 3.0 (Deauville 3). Right external iliac lymph node 1.7 cm in short axis on image 284 series 3, maximum SUV 3.5 (Deauville 4). Incidental CT findings: Normal appearance of the spleen. Cholelithiasis. 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 Aortoiliac atherosclerotic vascular disease. Dense contrast medium in sigmoid colon diverticula. Suspected flocculated barium in the appendix. Mild retroperitoneal stranding without overt retroperitoneal adenopathy in the upper abdomen. SKELETON: No significant abnormal hypermetabolic activity in this region. Incidental CT findings: Chronic bilateral pars defects at L5 with grade 2 anterolisthesis of L5 on S1 and resulting bilateral foraminal impingement at this level. Small benign-appearing lipoma within the right hip adductor musculature. IMPRESSION: 1. Mildly enlarged (each 1.7 cm in short axis) and mildly hypermetabolic external iliac lymph nodes bilaterally, Deauville 4 on the left and oval 3 on the right. Possibilities include reactive adenopathy and low-grade lymphoproliferative disease. 2. Other imaging findings of potential clinical  significance: Aberrant right subclavian artery. Aortic Atherosclerosis (ICD10-I70.0). Coronary atherosclerosis. Mild cardiomegaly. Cholelithiasis. 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 Electronically Signed   By: Van Clines M.D.   On: 08/20/2020 12:13   DG Abd 2 Views  Result Date: 07/27/2020 CLINICAL DATA:  69 year old male with abdominal pain, constipation and small-bowel obstruction EXAM: ABDOMEN - 2 VIEW COMPARISON:  Prior x-ray 07/24/2020 FINDINGS: No evidence of free air. The visualized lungs are essentially clear. Small amount of oral contrast material visualized within the cecum and hepatic flexure of the colon. Some gas is present within minimally dilated loops of small bowel in the left hemiabdomen. Overall, the degree of small-bowel distention has significantly improved compared to the prior radiograph. No acute osseous abnormality. IMPRESSION: 1. Significant improvement in the appearance of small-bowel obstruction compared to 07/24/2020. Only a small amount of gas-filled and borderline dilated loops of small bowel are identified in the left hemiabdomen. 2. No evidence of free air. Electronically Signed   By: Jacqulynn Cadet M.D.   On: 07/27/2020 10:49   DG ABD ACUTE 2+V W 1V CHEST  Result Date: 07/24/2020 CLINICAL DATA:  Abdominal pain EXAM: DG ABDOMEN ACUTE WITH 1 VIEW CHEST COMPARISON:  CT 07/23/2020 FINDINGS: Minimal left basilar atelectasis. Lungs are otherwise clear. No pneumothorax or pleural effusion. Cardiac size within normal limits. Multiple gas-filled dilated loops of small bowel are seen within the mid abdomen, similar to that noted on prior CT examination. There is relatively little gas within the colon, decreased since prior CT examination. Together, the findings are in keeping with a developing mid to distal small bowel obstruction. No free intraperitoneal gas. No abnormal calcifications within the  abdomen. Osseous structures are unremarkable.  IMPRESSION: Developing mid to distal small bowel obstruction.  No free air. Electronically Signed   By: Fidela Salisbury MD   On: 07/24/2020 20:03   DG Abd Portable 1V-Small Bowel Obstruction Protocol-initial, 8 hr delay  Result Date: 07/28/2020 CLINICAL DATA:  Bowel obstruction.  8 hour delay. EXAM: PORTABLE ABDOMEN - 1 VIEW COMPARISON:  July 28, 2020 FINDINGS: Oral contrast is noted in the small bowel and colon. No definite oral contrast at the level of the rectum. The small bowel loops appear to be nondilated. There is no free air. IMPRESSION: Nonobstructive bowel gas pattern. Oral contrast is noted in the small bowel and colon. No definite oral contrast at the level of the rectum. Electronically Signed   By: Constance Holster M.D.   On: 07/28/2020 19:54   DG Abd Portable 1V  Result Date: 07/28/2020 CLINICAL DATA:  Follow-up small-bowel obstruction protocol EXAM: PORTABLE ABDOMEN - 1 VIEW COMPARISON:  07/27/2020 FINDINGS: Contrast previously largely present within the right colon has now progressed at least to the splenic flexure. No dilated small bowel loops are visible. IMPRESSION: Progression of contrast to the splenic flexure. Electronically Signed   By: Nelson Chimes M.D.   On: 07/28/2020 13:22   ECHOCARDIOGRAM COMPLETE  Result Date: 07/24/2020    ECHOCARDIOGRAM REPORT   Patient Name:   MICHOLAS DRUMWRIGHT Date of Exam: 07/24/2020 Medical Rec #:  [id number]     Height:       72.0 in Accession #:    [id number]    Weight:       293.2 lb Date of Birth:  10/18/1950     BSA:          2.507 m Patient Age:    89 years      BP:           138/67 mmHg Patient Gender: M             HR:           102 bpm. Exam Location:  [name] Procedure: 2D Echo and Intracardiac Opacification Agent Indications:    Chest Pain 786.50 / R07.9  History:        Patient has prior history of Echocardiogram examinations, most                 recent 09/08/2018. CHF, COPD; Risk  Factors:Current Smoker,                 Diabetes, Hypertension and Dyslipidemia.  Sonographer:    Leavy Cella RDCS (AE) Referring Phys: [address]  1. Left ventricular ejection fraction, by estimation, is 55 to 60%. The left ventricle has normal function. The left ventricle has no regional wall motion abnormalities. There is moderate left ventricular hypertrophy. Left ventricular diastolic parameters are indeterminate.  2. Right ventricular systolic function is normal. The right ventricular size is normal.  3. The mitral valve is normal in structure. Mild mitral valve regurgitation. No evidence of mitral stenosis.  4. The aortic valve is tricuspid. There is mild calcification of the aortic valve. There is mild thickening of the aortic valve. Aortic valve regurgitation is not visualized. No aortic stenosis is present.  5. The inferior vena cava is normal in size with greater than 50% respiratory variability, suggesting right atrial pressure of 3 mmHg. FINDINGS  Left Ventricle: Left ventricular ejection fraction, by estimation, is 55 to 60%. The left ventricle has normal function. The left ventricle has no regional wall motion abnormalities. Definity contrast  agent was given IV to delineate the left ventricular  endocardial borders. The left ventricular internal cavity size was normal in size. There is moderate left ventricular hypertrophy. Left ventricular diastolic parameters are indeterminate. Right Ventricle: The right ventricular size is normal. No increase in right ventricular wall thickness. Right ventricular systolic function is normal. Left Atrium: Left atrial size was normal in size. Right Atrium: Right atrial size was normal in size. Pericardium: There is no evidence of pericardial effusion. Mitral Valve: The mitral valve is normal in structure. There is mild thickening of the mitral valve leaflet(s). There is mild calcification of the mitral valve leaflet(s). Mild mitral annular  calcification. Mild mitral valve regurgitation. No evidence of  mitral valve stenosis. Tricuspid Valve: The tricuspid valve is normal in structure. Tricuspid valve regurgitation is not demonstrated. No evidence of tricuspid stenosis. Aortic Valve: The aortic valve is tricuspid. There is mild calcification of the aortic valve. There is mild thickening of the aortic valve. There is mild aortic valve annular calcification. Aortic valve regurgitation is not visualized. No aortic stenosis  is present. Aortic valve mean gradient measures 5.5 mmHg. Aortic valve peak gradient measures 10.9 mmHg. Aortic valve area, by VTI measures 2.48 cm. Pulmonic Valve: The pulmonic valve was not well visualized. Pulmonic valve regurgitation is not visualized. No evidence of pulmonic stenosis. Aorta: The aortic root is normal in size and structure. Venous: The inferior vena cava is normal in size with greater than 50% respiratory variability, suggesting right atrial pressure of 3 mmHg. IAS/Shunts: No atrial level shunt detected by color flow Doppler.  LEFT VENTRICLE PLAX 2D LVIDd:         4.96 cm  Diastology LVIDs:         4.14 cm  LV e' medial:    8.92 cm/s LV PW:         1.50 cm  LV E/e' medial:  14.9 LV IVS:        1.55 cm  LV e' lateral:   13.60 cm/s LVOT diam:     2.00 cm  LV E/e' lateral: 9.8 LV SV:         76 LV SV Index:   30 LVOT Area:     3.14 cm  RIGHT VENTRICLE RV S prime:     16.40 cm/s TAPSE (M-mode): 2.4 cm LEFT ATRIUM             Index       RIGHT ATRIUM           Index LA diam:        4.50 cm 1.80 cm/m  RA Area:     16.90 cm LA Vol (A2C):   61.2 ml 24.41 ml/m RA Volume:   45.90 ml  18.31 ml/m LA Vol (A4C):   60.8 ml 24.25 ml/m LA Biplane Vol: 67.6 ml 26.97 ml/m  AORTIC VALVE AV Area (Vmax):    2.04 cm AV Area (Vmean):   1.97 cm AV Area (VTI):     2.48 cm AV Vmax:           165.30 cm/s AV Vmean:          108.114 cm/s AV VTI:            0.305 m AV Peak Grad:      10.9 mmHg AV Mean Grad:      5.5 mmHg LVOT Vmax:          107.40 cm/s LVOT Vmean:  67.714 cm/s LVOT VTI:          0.241 m LVOT/AV VTI ratio: 0.79  AORTA Ao Root diam: 2.90 cm MITRAL VALVE MV Area (PHT): 5.79 cm     SHUNTS MV Decel Time: 131 msec     Systemic VTI:  0.24 m MR Peak grad: 112.4 mmHg    Systemic Diam: 2.00 cm MR Mean grad: 79.0 mmHg MR Vmax:      530.00 cm/s MR Vmean:     426.0 cm/s MV E velocity: 133.00 cm/s MV A velocity: 129.00 cm/s MV E/A ratio:  1.03 Carlyle Dolly MD Electronically signed by Carlyle Dolly MD Signature Date/Time: 07/24/2020/12:04:52 PM    Final    ASSESSMENT:   1.  Iron deficiency anemia:  -Patient had admission in February 2021 with hemoglobin 5.4, status post 3 units PRBC. -Admission on 07/22/2020 with hemoglobin 6.2, 2 units PRBC transfusion. -History of melena, stool positive for occult blood.  No bright blood per rectum. 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 -Cannot tolerate iron pills secondary to constipation. -Received iron first week of December. Doing well today.  2.  Pelvic and retroperitoneal adenopathy:  -CT [facility] done for abdominal pain on 07/23/2020 showed pathologic adenopathy in the pelvis, 17 mm short axis left external iliac lymph node and 18 mm short axis right external iliac lymph node and multiple subcentimeter lymph nodes in the retroperitoneum and left para-aortic distribution.  PLAN:   1.  Iron deficiency anemia: Likely from acute blood loss in GI tract.  Last labs from 08/07/2020 showed a Hb of 9.8, microcytic hypochromic, no leukocytosis or leukopenia, mild thrombocytosis. Folate levels low, will recommend folic acid 1 mg po daily. M spike not observed on SPEP. LDH 169, no hemolysis. He just received iron and has no complaints consistent with ongoing blood loss. Repeat labs in 3 months and RTC for FU with Dr Raliegh Ip.  2.  Pelvic and  retroperitoneal adenopathy:  -CT abdomen and pelvis done for abdominal pain on 07/23/2020 showed pathologic adenopathy in the pelvis, 17 mm short axis left external iliac lymph node and 18 mm short axis right external iliac lymph node and multiple subcentimeter lymph nodes in the retroperitoneum and left para-aortic distribution. - PSA normal -PET CT with mildly enlarged and mildly hypermetabolic external iliac LN bilaterally, Deauville 4 on the left and oval 3 on the right, possibilities include reactive adenopathy and low grade lymphoproliferative disease. CBC 2 weeks ago with some microcytic hypochromic anemia, WBC normal and platelets normal. Absolute neutrophilia. No absolute lymphocytosis. Given only mildly metabolic LN, and no evidence any findings suggestive of lymphoproliferative disease on CBC, I would recommend monitoring with possibly repeat CT imaging in 6 months.  Benay Pike MD

## 2020-08-23 ENCOUNTER — Other Ambulatory Visit: Payer: Self-pay

## 2020-08-23 ENCOUNTER — Inpatient Hospital Stay (HOSPITAL_COMMUNITY): Payer: Medicare Other | Admitting: Hematology and Oncology

## 2020-08-23 ENCOUNTER — Encounter (HOSPITAL_COMMUNITY): Payer: Self-pay | Admitting: Hematology and Oncology

## 2020-08-23 VITALS — BP 153/62 | HR 91 | Temp 98.1°F | Resp 18

## 2020-08-23 DIAGNOSIS — R590 Localized enlarged lymph nodes: Secondary | ICD-10-CM | POA: Diagnosis not present

## 2020-08-23 DIAGNOSIS — I110 Hypertensive heart disease with heart failure: Secondary | ICD-10-CM | POA: Diagnosis not present

## 2020-08-23 DIAGNOSIS — D509 Iron deficiency anemia, unspecified: Secondary | ICD-10-CM | POA: Diagnosis not present

## 2020-08-23 DIAGNOSIS — D500 Iron deficiency anemia secondary to blood loss (chronic): Secondary | ICD-10-CM | POA: Diagnosis not present

## 2020-08-23 DIAGNOSIS — M4317 Spondylolisthesis, lumbosacral region: Secondary | ICD-10-CM | POA: Diagnosis not present

## 2020-08-23 DIAGNOSIS — R0602 Shortness of breath: Secondary | ICD-10-CM | POA: Diagnosis not present

## 2020-08-23 DIAGNOSIS — F17210 Nicotine dependence, cigarettes, uncomplicated: Secondary | ICD-10-CM | POA: Diagnosis not present

## 2020-08-23 DIAGNOSIS — K8020 Calculus of gallbladder without cholecystitis without obstruction: Secondary | ICD-10-CM | POA: Diagnosis not present

## 2020-08-23 DIAGNOSIS — G4700 Insomnia, unspecified: Secondary | ICD-10-CM | POA: Diagnosis not present

## 2020-08-23 DIAGNOSIS — I340 Nonrheumatic mitral (valve) insufficiency: Secondary | ICD-10-CM | POA: Diagnosis not present

## 2020-08-23 DIAGNOSIS — Q278 Other specified congenital malformations of peripheral vascular system: Secondary | ICD-10-CM | POA: Diagnosis not present

## 2020-08-23 DIAGNOSIS — M7989 Other specified soft tissue disorders: Secondary | ICD-10-CM | POA: Diagnosis not present

## 2020-08-23 DIAGNOSIS — K56609 Unspecified intestinal obstruction, unspecified as to partial versus complete obstruction: Secondary | ICD-10-CM | POA: Diagnosis not present

## 2020-08-23 DIAGNOSIS — E119 Type 2 diabetes mellitus without complications: Secondary | ICD-10-CM | POA: Diagnosis not present

## 2020-08-23 DIAGNOSIS — Z79899 Other long term (current) drug therapy: Secondary | ICD-10-CM | POA: Diagnosis not present

## 2020-08-23 DIAGNOSIS — I509 Heart failure, unspecified: Secondary | ICD-10-CM | POA: Diagnosis not present

## 2020-08-23 DIAGNOSIS — J449 Chronic obstructive pulmonary disease, unspecified: Secondary | ICD-10-CM | POA: Diagnosis not present

## 2020-08-23 DIAGNOSIS — Z8249 Family history of ischemic heart disease and other diseases of the circulatory system: Secondary | ICD-10-CM | POA: Diagnosis not present

## 2020-08-23 DIAGNOSIS — R079 Chest pain, unspecified: Secondary | ICD-10-CM | POA: Diagnosis not present

## 2020-08-23 DIAGNOSIS — Z833 Family history of diabetes mellitus: Secondary | ICD-10-CM | POA: Diagnosis not present

## 2020-08-23 DIAGNOSIS — I2510 Atherosclerotic heart disease of native coronary artery without angina pectoris: Secondary | ICD-10-CM | POA: Diagnosis not present

## 2020-08-23 DIAGNOSIS — R5383 Other fatigue: Secondary | ICD-10-CM | POA: Diagnosis not present

## 2020-08-23 DIAGNOSIS — R3589 Other polyuria: Secondary | ICD-10-CM | POA: Diagnosis not present

## 2020-08-23 DIAGNOSIS — K922 Gastrointestinal hemorrhage, unspecified: Secondary | ICD-10-CM | POA: Diagnosis not present

## 2020-08-23 NOTE — Patient Instructions (Signed)
[facility] at [facility] Discharge Instructions  You were seen today by Dr. Iruku. Follow up as scheduled.   Thank you for choosing [facility] at [facility] to provide your oncology and hematology care.  To afford each patient quality time with our provider, please arrive at least 15 minutes before your scheduled appointment time.   If you have a lab appointment with the Cancer Center please come in thru the Main Entrance and check in at the main information desk.  You need to re-schedule your appointment should you arrive 10 or more minutes late.  We strive to give you quality time with our providers, and arriving late affects you and other patients whose appointments are after yours.  Also, if you no show three or more times for appointments you may be dismissed from the clinic at the providers discretion.     Again, thank you for choosing [facility].  Our hope is that these requests will decrease the amount of time that you wait before being seen by our physicians.       _____________________________________________________________  Should you have questions after your visit to [facility], please contact our office at [phone] and follow the prompts.  Our office hours are 8:00 a.m. and 4:30 p.m. Monday - Friday.  Please note that voicemails left after 4:00 p.m. may not be returned until the following business day.  We are closed weekends and major holidays.  You do have access to a nurse 24-7, just call the main number to the clinic [phone] and do not press any options, hold on the line and a nurse will answer the phone.    For prescription refill requests, have your pharmacy contact our office and allow 72 hours.    Due to Covid, you will need to wear a mask upon entering the hospital. If you do not have a mask, a mask will be given to you at the Main Entrance upon arrival. For doctor visits, patients may  have 1 support person age 18 or older with them. For treatment visits, patients can not have anyone with them due to social distancing guidelines and our immunocompromised population.     

## 2020-09-03 ENCOUNTER — Other Ambulatory Visit: Payer: Self-pay | Admitting: Cardiology

## 2020-09-04 ENCOUNTER — Telehealth: Payer: Self-pay | Admitting: Cardiology

## 2020-09-04 MED ORDER — NITROGLYCERIN 0.4 MG SL SUBL: SUBLINGUAL_TABLET | SUBLINGUAL | 3 refills | Status: DC

## 2020-09-04 NOTE — Telephone Encounter (Signed)
*  STAT* If patient is at the pharmacy, call can be transferred to refill team.   1. Which medications need to be refilled? (please list name of each medication and dose if known) nitro  2. Which pharmacy/location (including street and city if local pharmacy) is medication to be sent to? [facility]   3. Do they need a 30 day or 90 day supply? [place]

## 2020-09-04 NOTE — Telephone Encounter (Signed)
Refilled NTG to [facility]

## 2020-09-05 DIAGNOSIS — J449 Chronic obstructive pulmonary disease, unspecified: Secondary | ICD-10-CM | POA: Diagnosis not present

## 2020-09-11 DIAGNOSIS — I509 Heart failure, unspecified: Secondary | ICD-10-CM | POA: Diagnosis not present

## 2020-09-11 DIAGNOSIS — Z9911 Dependence on respirator [ventilator] status: Secondary | ICD-10-CM | POA: Diagnosis not present

## 2020-09-11 DIAGNOSIS — I5023 Acute on chronic systolic (congestive) heart failure: Secondary | ICD-10-CM | POA: Diagnosis not present

## 2020-09-11 DIAGNOSIS — D509 Iron deficiency anemia, unspecified: Secondary | ICD-10-CM | POA: Diagnosis not present

## 2020-09-11 DIAGNOSIS — R198 Other specified symptoms and signs involving the digestive system and abdomen: Secondary | ICD-10-CM | POA: Diagnosis not present

## 2020-09-11 DIAGNOSIS — E1165 Type 2 diabetes mellitus with hyperglycemia: Secondary | ICD-10-CM | POA: Diagnosis not present

## 2020-09-11 DIAGNOSIS — I110 Hypertensive heart disease with heart failure: Secondary | ICD-10-CM | POA: Diagnosis not present

## 2020-09-11 DIAGNOSIS — Z794 Long term (current) use of insulin: Secondary | ICD-10-CM | POA: Diagnosis not present

## 2020-09-11 DIAGNOSIS — R079 Chest pain, unspecified: Secondary | ICD-10-CM | POA: Diagnosis not present

## 2020-09-11 DIAGNOSIS — I517 Cardiomegaly: Secondary | ICD-10-CM | POA: Diagnosis not present

## 2020-09-11 DIAGNOSIS — R633 Feeding difficulties, unspecified: Secondary | ICD-10-CM | POA: Diagnosis not present

## 2020-09-11 DIAGNOSIS — R739 Hyperglycemia, unspecified: Secondary | ICD-10-CM | POA: Diagnosis not present

## 2020-09-11 DIAGNOSIS — Z7982 Long term (current) use of aspirin: Secondary | ICD-10-CM | POA: Diagnosis not present

## 2020-09-11 DIAGNOSIS — J9600 Acute respiratory failure, unspecified whether with hypoxia or hypercapnia: Secondary | ICD-10-CM | POA: Diagnosis not present

## 2020-09-11 DIAGNOSIS — J9601 Acute respiratory failure with hypoxia: Secondary | ICD-10-CM | POA: Diagnosis not present

## 2020-09-11 DIAGNOSIS — J90 Pleural effusion, not elsewhere classified: Secondary | ICD-10-CM | POA: Diagnosis not present

## 2020-09-11 DIAGNOSIS — R918 Other nonspecific abnormal finding of lung field: Secondary | ICD-10-CM | POA: Diagnosis not present

## 2020-09-11 DIAGNOSIS — J440 Chronic obstructive pulmonary disease with acute lower respiratory infection: Secondary | ICD-10-CM | POA: Diagnosis not present

## 2020-09-11 DIAGNOSIS — Z743 Need for continuous supervision: Secondary | ICD-10-CM | POA: Diagnosis not present

## 2020-09-11 DIAGNOSIS — I10 Essential (primary) hypertension: Secondary | ICD-10-CM | POA: Diagnosis not present

## 2020-09-11 DIAGNOSIS — Z4682 Encounter for fitting and adjustment of non-vascular catheter: Secondary | ICD-10-CM | POA: Diagnosis not present

## 2020-09-11 DIAGNOSIS — J441 Chronic obstructive pulmonary disease with (acute) exacerbation: Secondary | ICD-10-CM | POA: Diagnosis not present

## 2020-09-11 DIAGNOSIS — R0600 Dyspnea, unspecified: Secondary | ICD-10-CM | POA: Diagnosis not present

## 2020-09-11 DIAGNOSIS — R0689 Other abnormalities of breathing: Secondary | ICD-10-CM | POA: Diagnosis not present

## 2020-09-11 DIAGNOSIS — I25110 Atherosclerotic heart disease of native coronary artery with unstable angina pectoris: Secondary | ICD-10-CM | POA: Diagnosis not present

## 2020-09-11 DIAGNOSIS — Z792 Long term (current) use of antibiotics: Secondary | ICD-10-CM | POA: Diagnosis not present

## 2020-09-11 DIAGNOSIS — I2510 Atherosclerotic heart disease of native coronary artery without angina pectoris: Secondary | ICD-10-CM

## 2020-09-11 DIAGNOSIS — R0603 Acute respiratory distress: Secondary | ICD-10-CM | POA: Diagnosis not present

## 2020-09-11 DIAGNOSIS — Z7902 Long term (current) use of antithrombotics/antiplatelets: Secondary | ICD-10-CM | POA: Diagnosis not present

## 2020-09-11 DIAGNOSIS — I214 Non-ST elevation (NSTEMI) myocardial infarction: Secondary | ICD-10-CM | POA: Diagnosis not present

## 2020-09-11 DIAGNOSIS — R6520 Severe sepsis without septic shock: Secondary | ICD-10-CM | POA: Diagnosis not present

## 2020-09-11 DIAGNOSIS — A419 Sepsis, unspecified organism: Secondary | ICD-10-CM | POA: Diagnosis not present

## 2020-09-11 DIAGNOSIS — F17200 Nicotine dependence, unspecified, uncomplicated: Secondary | ICD-10-CM | POA: Diagnosis not present

## 2020-09-11 DIAGNOSIS — E785 Hyperlipidemia, unspecified: Secondary | ICD-10-CM | POA: Diagnosis not present

## 2020-09-11 DIAGNOSIS — J690 Pneumonitis due to inhalation of food and vomit: Secondary | ICD-10-CM | POA: Diagnosis not present

## 2020-09-11 DIAGNOSIS — I5021 Acute systolic (congestive) heart failure: Secondary | ICD-10-CM | POA: Diagnosis not present

## 2020-09-11 DIAGNOSIS — R0602 Shortness of breath: Secondary | ICD-10-CM | POA: Diagnosis not present

## 2020-09-11 DIAGNOSIS — J9811 Atelectasis: Secondary | ICD-10-CM | POA: Diagnosis not present

## 2020-09-11 DIAGNOSIS — J9602 Acute respiratory failure with hypercapnia: Secondary | ICD-10-CM | POA: Diagnosis not present

## 2020-09-11 DIAGNOSIS — I21A1 Myocardial infarction type 2: Secondary | ICD-10-CM | POA: Diagnosis not present

## 2020-09-11 DIAGNOSIS — R1310 Dysphagia, unspecified: Secondary | ICD-10-CM | POA: Diagnosis not present

## 2020-09-11 DIAGNOSIS — J811 Chronic pulmonary edema: Secondary | ICD-10-CM | POA: Diagnosis not present

## 2020-09-11 DIAGNOSIS — R55 Syncope and collapse: Secondary | ICD-10-CM | POA: Diagnosis not present

## 2020-09-11 HISTORY — DX: Atherosclerotic heart disease of native coronary artery without angina pectoris: I25.10

## 2020-09-17 ENCOUNTER — Encounter: Payer: Self-pay | Admitting: Gastroenterology

## 2020-09-17 NOTE — Progress Notes (Deleted)
Primary Care Physician: Benita Stabile, MD  Primary Gastroenterologist:  Roetta Sessions, MD   No chief complaint on file.   HPI: Gregory Alvarado is a 70 y.o. male here for follow up. Seen in 07/2020.   Patient has a history of GI bleed back in February 2021, intermittent melena.  Hemoglobin 5.4, ferritin 4, folate 3.7.Patient had EGD 10/2019 with multiple cratered gastric ulcers, gastritis/duodenitis. Path showed H.pylori, and patient was treated with PPI BID, amoxicillin, biaxin. At time of discharge his Hgb was 8.4. He was also noted to take 4 BC powders daily without PPI.  EGD and colonoscopy August 2021.  Patient had poor bowel prep.  2 polyps removed, tubular adenoma.  Advised to follow-up colonoscopy later this year.  EGD showed previous gastric ulcers completely healed.  Gastric erythema somewhat patchy status post biopsy showed persistent H. Pylori (failed twice daily PPI, amoxicillin, Biaxin).  Esophagus appeared normal, status post dilation.  Patient retreated with pantoprazole twice daily along with Pepto-Bismol/tetracycline/metronidazole for repeat H. pylori treatment.   Previously declined repeat colonoscopy.    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 Hospital course complicated by partial SBO responded to medical management. Labs indicated. IDA.  After admission 07/2020, advised need for outpatient follow up to arrange colonoscopy and H.pylori eradication testing.   Has been seen by hematology for iron infusions.  Current Outpatient Medications  Medication Sig Dispense Refill  . acetaminophen (TYLENOL) 325 MG tablet Take 2 tablets (650 mg total) by mouth every 6 (six) hours as needed for mild pain (or Fever >/= 101). 12 tablet 0  . albuterol (VENTOLIN HFA) 108 (90 Base) MCG/ACT inhaler Inhale 2 puffs into the lungs every 6 (six) hours as needed for wheezing or shortness of  breath. 18 g 0  . atorvastatin (LIPITOR) 40 MG tablet Take 40 mg by mouth at bedtime.    . cyanocobalamin (,VITAMIN B-12,) 1000 MCG/ML injection Inject 1,000 mcg into the muscle every 30 (thirty) days.     Marland Kitchen docusate sodium (COLACE) 100 MG capsule Take 1 capsule (100 mg total) by mouth daily. 60 capsule 1  . folic acid (FOLVITE) 1 MG tablet Take 1 tablet (1 mg total) by mouth daily. (Patient taking differently: Take 1 mg by mouth at bedtime.) 30 tablet 2  . gabapentin (NEURONTIN) 100 MG capsule Take 100 mg by mouth daily.    . hydrochlorothiazide (HYDRODIURIL) 12.5 MG tablet Take 12.5 mg by mouth at bedtime.     . insulin NPH-regular Human (NOVOLIN 70/30) (70-30) 100 UNIT/ML injection Inject 50 Units into the skin in the morning and at bedtime. Inject 50 units in morning and 50 units every evening    . Insulin Pen Needle (B-D ULTRAFINE III SHORT PEN) 31G X 8 MM MISC 1 each by Does not apply route as directed. 100 each 3  . INSULIN SYRINGE .5CC/29G 29G X 1/2" 0.5 ML MISC Use to inject insulin 2 times a day 100 each 3  . isosorbide mononitrate (IMDUR) 30 MG 24 hr tablet Take 1 tablet (30 mg total) by mouth daily. May take 2 if needed 180 tablet 3  . lisinopril (PRINIVIL,ZESTRIL) 20 MG tablet Take 1 tablet (20 mg total) by mouth daily. 30 tablet 6  . naproxen (NAPROSYN) 500 MG tablet Take 500 mg by mouth 2 (two) times daily.    . nitroGLYCERIN (NITROSTAT) 0.4 MG SL tablet DISSOLVE  1 TABLET SUBLINGUALLY AS NEEDED FOR CHEST PAIN, MAY REPEAT EVERY 5 MINUTES. AFTER 3 CALL 911. 25 tablet 3  . ONE TOUCH ULTRA TEST test strip TEST FOUR TIMES DAILY AS DIRECTED. 150 each 5  . pantoprazole (PROTONIX) 40 MG tablet Take 1 tablet (40 mg total) by mouth 2 (two) times daily before a meal. 60 tablet 5  . polyethylene glycol (MIRALAX / GLYCOLAX) 17 g packet Take 17 g by mouth daily. 28 each 1  . traMADol (ULTRAM) 50 MG tablet Take 50 mg by mouth in the morning and at bedtime.     Marland Kitchen zolpidem (AMBIEN) 10 MG tablet Take  10 mg by mouth at bedtime.     No current facility-administered medications for this visit.    Allergies as of 09/18/2020 - Review Complete 08/23/2020  Allergen Reaction Noted  . Caduet [amlodipine-atorvastatin] Swelling 09/18/2015    ROS:  General: Negative for anorexia, weight loss, fever, chills, fatigue, weakness. ENT: Negative for hoarseness, difficulty swallowing , nasal congestion. CV: Negative for chest pain, angina, palpitations, dyspnea on exertion, peripheral edema.  Respiratory: Negative for dyspnea at rest, dyspnea on exertion, cough, sputum, wheezing.  GI: See history of present illness. GU:  Negative for dysuria, hematuria, urinary incontinence, urinary frequency, nocturnal urination.  Endo: Negative for unusual weight change.    Physical Examination:   There were no vitals taken for this visit.  General: Well-nourished, well-developed in no acute distress.  Eyes: No icterus. Mouth: Oropharyngeal mucosa moist and pink , no lesions erythema or exudate. Lungs: Clear to auscultation bilaterally.  Heart: Regular rate and rhythm, no murmurs rubs or gallops.  Abdomen: Bowel sounds are normal, nontender, nondistended, no hepatosplenomegaly or masses, no abdominal bruits or hernia , no rebound or guarding.   Extremities: No lower extremity edema. No clubbing or deformities. Neuro: Alert and oriented x 4   Skin: Warm and dry, no jaundice.   Psych: Alert and cooperative, normal mood and affect.  Labs:  Lab Results  Component Value Date   WBC 10.4 08/07/2020   HGB 9.8 (L) 08/07/2020   HCT 35.2 (L) 08/07/2020   MCV 78.6 (L) 08/07/2020   PLT 456 (H) 08/07/2020   Lab Results  Component Value Date   CREATININE 1.24 07/30/2020   BUN 15 07/30/2020   NA 135 07/30/2020   K 4.0 07/30/2020   CL 101 07/30/2020   CO2 26 07/30/2020   Lab Results  Component Value Date   ALT 10 07/22/2020   AST 10 (L) 07/22/2020   ALKPHOS 62 07/22/2020   BILITOT 0.4 07/22/2020   Lab  Results  Component Value Date   CREATININE 1.24 07/30/2020   BUN 15 07/30/2020   NA 135 07/30/2020   K 4.0 07/30/2020   CL 101 07/30/2020   CO2 26 07/30/2020   Lab Results  Component Value Date   IRON 13 (L) 07/22/2020   TIBC 481 (H) 07/22/2020   FERRITIN 4 (L) 07/22/2020   Lab Results  Component Value Date   FOLATE 5.2 (L) 08/07/2020     Imaging Studies: NM PET Image Initial (PI) Skull Base To Thigh  Result Date: 08/20/2020 CLINICAL DATA:  Initial treatment strategy for pelvic adenopathy and anemia. EXAM: NUCLEAR MEDICINE PET SKULL BASE TO THIGH TECHNIQUE: 16.2 mCi F-18 FDG was injected intravenously. Full-ring PET imaging was performed from the skull base to thigh after the radiotracer. CT data was obtained and used for attenuation correction and anatomic localization. Fasting blood glucose: 83 mg/dl COMPARISON:  CT  scan 07/23/2020 FINDINGS: Mediastinal blood pool activity: SUV max 2.4 Liver activity: SUV max 3.2 NECK: Right level IB lymph node approximately 0.7 cm in short axis on image 49 series 3, maximum SUV 2.3. This lymph node is not pathologically enlarged and is below blood pool activity. Incidental CT findings: Left mastoidectomy. CHEST: No significant abnormal hypermetabolic activity in this region. Incidental CT findings: Aberrant right subclavian artery passes behind the esophagus. Coronary, aortic arch, and branch vessel atherosclerotic vascular disease. Mild cardiomegaly. ABDOMEN/PELVIS: Left external iliac node 1.7 cm in short axis on image 285 of series 3, maximum SUV 3.0 (Deauville 3). Right external iliac lymph node 1.7 cm in short axis on image 284 series 3, maximum SUV 3.5 (Deauville 4). Incidental CT findings: Normal appearance of the spleen. Cholelithiasis. 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 Aortoiliac atherosclerotic vascular disease. Dense contrast medium in sigmoid colon diverticula. Suspected flocculated barium in the appendix. Mild retroperitoneal stranding  without overt retroperitoneal adenopathy in the upper abdomen. SKELETON: No significant abnormal hypermetabolic activity in this region. Incidental CT findings: Chronic bilateral pars defects at L5 with grade 2 anterolisthesis of L5 on S1 and resulting bilateral foraminal impingement at this level. Small benign-appearing lipoma within the right hip adductor musculature. IMPRESSION: 1. Mildly enlarged (each 1.7 cm in short axis) and mildly hypermetabolic external iliac lymph nodes bilaterally, Deauville 4 on the left and oval 3 on the right. Possibilities include reactive adenopathy and low-grade lymphoproliferative disease. 2. Other imaging findings of potential clinical significance: Aberrant right subclavian artery. Aortic Atherosclerosis (ICD10-I70.0). Coronary atherosclerosis. Mild cardiomegaly. Cholelithiasis. 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 Electronically Signed   By: Van Clines M.D.   On: 08/20/2020 12:13

## 2020-09-18 ENCOUNTER — Ambulatory Visit: Payer: Medicare Other | Admitting: Gastroenterology

## 2020-09-18 ENCOUNTER — Encounter: Payer: Self-pay | Admitting: Internal Medicine

## 2020-09-18 NOTE — Progress Notes (Deleted)
Cardiology Office Note    Date:  09/18/2020   ID:  Gregory Alvarado, DOB 12-14-1950, MRN [id number]  PCP:  Celene Squibb, MD  Cardiologist: Carlyle Dolly, MD EPS: None  No chief complaint on file.   History of Present Illness:  Gregory Alvarado is a 70 y.o. male with history of chronic diastolic CHF, morbid obesity, COPD on O2, IDDM, chronic renal insufficiency stage II, history of peptic ulcer disease and GI bleed, hypertension, HLD.  Patient saw Kerin Ransom, PA-C 07/09/2020 with symptoms of exertional angina.  He was not felt to be a candidate for NST due to wheezing and could not have CTA secondary to his weight.  He is a poor candidate for cath secondary to CRI inability to lie flat and peptic ulcer disease may prohibit DAPT.  Dr. Harl Bowie reviewed his case and recommended adding Imdur. If symptoms don't improve consider cath.  Past Medical History:  Diagnosis Date  . Anemia   . Arthritis    left shoulder  . COPD (chronic obstructive pulmonary disease) ([place])   . Diabetes mellitus without complication ([place])   . Dyslipidemia   . Hypertension   . Insomnia   . Insomnia   . Morbid obesity ([place])   . Tobacco use     Past Surgical History:  Procedure Laterality Date  . BIOPSY  10/28/2019   Procedure: BIOPSY;  Surgeon: Danie Binder, MD;  [facility]: [facility];  Service: Endoscopy;;  gastric  . BIOPSY  04/15/2020   Procedure: BIOPSY;  Surgeon: Daneil Dolin, MD;  Location: [facility];  Service: Endoscopy;;  gastric  . COLON RESECTION  1990s?   DIVERTICULITIS  . COLONOSCOPY WITH PROPOFOL N/A 04/15/2020   Rourk: Poor colon prep. two 6 to 8 mm polyps removed from the hepatic flexure, tubular adenomas.  Repeat colonoscopy later this year.  . ESOPHAGOGASTRODUODENOSCOPY (EGD) WITH PROPOFOL N/A 10/28/2019   Dr. Oneida Alar: Multiple cratered gastric ulcers, gastritis/duodenitis.  Path showed H. pylori.  Patient treated with amoxicillin/Biaxin/PPI twice daily.  . ESOPHAGOGASTRODUODENOSCOPY  (EGD) WITH PROPOFOL N/A 04/15/2020   Rourk: Patchy gastric erythema status post biopsy to document eradication of H. pylori.  Biopsy showed persistent H. pylori.  Previously noted gastric ulcer is completely healed.  . 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  Marland Kitchen HERNIA REPAIR     UHR  . MALONEY DILATION N/A 04/15/2020   Procedure: Venia Minks DILATION;  Surgeon: Daneil Dolin, MD;  Location: [facility];  Service: Endoscopy;  Laterality: N/A;  . POLYPECTOMY  04/15/2020   Procedure: POLYPECTOMY;  Surgeon: Daneil Dolin, MD;  Location: [facility];  Service: Endoscopy;;  colon  . ROTATOR CUFF REPAIR Bilateral   . TYMPANOPLASTY      Current Medications: No outpatient medications have been marked as taking for the 09/24/20 encounter (Appointment) with Imogene Burn, PA-C.     Allergies:   Caduet [amlodipine-atorvastatin]   Social History   Socioeconomic History  . Marital status: Divorced    Spouse name: Not on file  . Number of children: 2  . Years of education: Not on file  . Highest education level: Not on file  Occupational History  . Occupation: Retired  Tobacco Use  . Smoking status: Current Every Day Smoker    Packs/day: 2.00    Types: Cigarettes  . Smokeless tobacco: Never Used  Vaping Use  . Vaping  Use: Never used  Substance and Sexual Activity  . Alcohol use: Not Currently    Comment: rare  . Drug use: No  . Sexual activity: Not on file  Other Topics Concern  . Not on file  Social History Narrative  . Not on file   Social Determinants of Health   Financial Resource Strain: Low Risk   . Difficulty of Paying Living Expenses: Not very hard  Food Insecurity: No Food Insecurity  . Worried About Charity fundraiser in the Last Year: Never true  . Ran Out of Food in the Last Year: Never true  Transportation Needs: No Transportation Needs  . Lack of  Transportation (Medical): No  . Lack of Transportation (Non-Medical): No  Physical Activity: Inactive  . Days of Exercise per Week: 0 days  . Minutes of Exercise per Session: 0 min  Stress: No Stress Concern Present  . Feeling of Stress : Not at all  Social Connections: Socially Isolated  . Frequency of Communication with Friends and Family: Three times a week  . Frequency of Social Gatherings with Friends and Family: Three times a week  . Attends Religious Services: Never  . Active Member of Clubs or Organizations: No  . Attends Archivist Meetings: Never  . Marital Status: Divorced     Family History:  The patient's ***family history includes Diabetes in his mother; Heart attack in his brother, father, and mother.   ROS:   Please see the history of present illness.    ROS All other systems reviewed and are negative.   PHYSICAL EXAM:   VS:  There were no vitals taken for this visit.  Physical Exam  GEN: Well nourished, well developed, in no acute distress  HEENT: normal  Neck: no JVD, carotid bruits, or masses Cardiac:RRR; no murmurs, rubs, or gallops  Respiratory:  clear to auscultation bilaterally, normal work of breathing GI: soft, nontender, nondistended, + BS Ext: without cyanosis, clubbing, or edema, Good distal pulses bilaterally MS: no deformity or atrophy  Skin: warm and dry, no rash Neuro:  Alert and Oriented x 3, Strength and sensation are intact Psych: euthymic mood, full affect  Wt Readings from Last 3 Encounters:  08/07/20 284 lb 1.6 oz (128.9 kg)  07/23/20 293 lb 3.4 oz (133 kg)  07/16/20 294 lb 3.2 oz (133.4 kg)      Studies/Labs Reviewed:   EKG:  EKG is*** ordered today.  The ekg ordered today demonstrates ***  Recent Labs: 07/22/2020: ALT 10; B Natriuretic Peptide 165.0 07/30/2020: BUN 15; Creatinine, Ser 1.24; Magnesium 2.4; Potassium 4.0; Sodium 135 08/07/2020: Hemoglobin 9.8; Platelets 456   Lipid Panel    Component Value  Date/Time   CHOL 186 02/18/2017 0737   TRIG 329 (H) 02/18/2017 0737   HDL 28 (L) 02/18/2017 0737   LDLCALC 92 02/18/2017 0737    Additional studies/ records that were reviewed today include:  Echo 07/24/2020 IMPRESSIONS     1. Left ventricular ejection fraction, by estimation, is 55 to 60%. The  left ventricle has normal function. The left ventricle has no regional  wall motion abnormalities. There is moderate left ventricular hypertrophy.  Left ventricular diastolic  parameters are indeterminate.   2. Right ventricular systolic function is normal. The right ventricular  size is normal.   3. The mitral valve is normal in structure. Mild mitral valve  regurgitation. No evidence of mitral stenosis.   4. The aortic valve is tricuspid. There is mild calcification of the  aortic valve. There is mild thickening of the aortic valve. Aortic valve  regurgitation is not visualized. No aortic stenosis is present.   5. The inferior vena cava is normal in size with greater than 50%  respiratory variability, suggesting right atrial pressure of 3 mmHg.   FINDINGS   Left Ventricle: Left ventricular ejection fraction, by estimation, is 55  to 60%. The left ventricle has normal function. The left ventricle has no  regional wall motion abnormalities. Definity contrast agent was given IV  to delineate the left ventricular   endocardial borders. The left ventricular internal cavity size was normal  in size. There is moderate left ventricular hypertrophy. Left ventricular  diastolic parameters are indeterminate.   Right Ventricle: The right ventricular size is normal. No increase in  right ventricular wall thickness. Right ventricular systolic function is  normal.   Left Atrium: Left atrial size was normal in size.   Right Atrium: Right atrial size was normal in size.   Pericardium: There is no evidence of pericardial effusion.   Mitral Valve: The mitral valve is normal in structure. There is  mild  thickening of the mitral valve leaflet(s). There is mild calcification of  the mitral valve leaflet(s). Mild mitral annular calcification. Mild  mitral valve regurgitation. No evidence of   mitral valve stenosis.   Tricuspid Valve: The tricuspid valve is normal in structure. Tricuspid  valve regurgitation is not demonstrated. No evidence of tricuspid  stenosis.   Aortic Valve: The aortic valve is tricuspid. There is mild calcification  of the aortic valve. There is mild thickening of the aortic valve. There  is mild aortic valve annular calcification. Aortic valve regurgitation is  not visualized. No aortic stenosis   is present. Aortic valve mean gradient measures 5.5 mmHg. Aortic valve  peak gradient measures 10.9 mmHg. Aortic valve area, by VTI measures 2.48  cm.   Pulmonic Valve: The pulmonic valve was not well visualized. Pulmonic valve  regurgitation is not visualized. No evidence of pulmonic stenosis.   Aorta: The aortic root is normal in size and structure.   Venous: The inferior vena cava is normal in size with greater than 50%  respiratory variability, suggesting right atrial pressure of 3 mmHg.   IAS/Shunts: No atrial level shunt detected by color flow Doppler.     LEFT VENTRICLE  PLAX 2D  LVIDd:         4.96 cm  Diastology  LVIDs:         4.14 cm  LV e' medial:    8.92 cm/s  LV PW:         1.50 cm  LV E/e' medial:  14.9  LV IVS:        1.55 cm  LV e' lateral:   13.60 cm/s  LVOT diam:     2.00 cm  LV E/e' lateral: 9.8  LV SV:         76  LV SV Index:   30  LVOT Area:     3.14 cm     RIGHT VENTRICLE  RV S prime:     16.40 cm/s  TAPSE (M-mode): 2.4 cm   LEFT ATRIUM             Index       RIGHT ATRIUM           Index  LA diam:        4.50 cm 1.80 cm/m  RA Area:     16.90 cm  LA Vol (A2C):   61.2 ml 24.41 ml/m RA Volume:   45.90 ml  18.31 ml/m  LA Vol (A4C):   60.8 ml 24.25 ml/m  LA Biplane Vol: 67.6 ml 26.97 ml/m   AORTIC VALVE  AV Area  (Vmax):    2.04 cm  AV Area (Vmean):   1.97 cm  AV Area (VTI):     2.48 cm  AV Vmax:           165.30 cm/s  AV Vmean:          108.114 cm/s  AV VTI:            0.305 m  AV Peak Grad:      10.9 mmHg  AV Mean Grad:      5.5 mmHg  LVOT Vmax:         107.40 cm/s  LVOT Vmean:        67.714 cm/s  LVOT VTI:          0.241 m  LVOT/AV VTI ratio: 0.79    AORTA  Ao Root diam: 2.90 cm   MITRAL VALVE  MV Area (PHT): 5.79 cm     SHUNTS  MV Decel Time: 131 msec     Systemic VTI:  0.24 m  MR Peak grad: 112.4 mmHg    Systemic Diam: 2.00 cm  MR Mean grad: 79.0 mmHg  MR Vmax:      530.00 cm/s  MR Vmean:     426.0 cm/s  MV E velocity: 133.00 cm/s  MV A velocity: 129.00 cm/s  MV E/A ratio:  1.03   Dina Rich MD  Electronically signed by Dina Rich MD  Signature Date/Time: 07/24/2020/12:04:52 PM        Final      Risk Assessment/Calculations:   {Does this patient have ATRIAL FIBRILLATION?:[phone]}     ASSESSMENT:    1. Precordial pain   2. Chronic diastolic CHF (congestive heart failure) (HCC)   3. Chronic obstructive pulmonary disease, unspecified COPD type (HCC)   4. Insulin dependent type 2 diabetes mellitus (HCC)   5. Essential hypertension   6. Other hyperlipidemia   7. CRI (chronic renal insufficiency), stage 2 (mild)   8. PUD (peptic ulcer disease)   9. Obesity, Class III, BMI 40-49.9 (morbid obesity) (HCC)      PLAN:  In order of problems listed above:  Chest pain consistent with angina but not a candidate for NST secondary to wheezing, cannot do CT because of weight and poor candidate for cath because of CKD, inability to lay flat and PUD may prohibit DAPT treatment.  Imdur added last office visit on ultimately may need cath if symptoms do not improve.  Chronic diastolic CHF Echo 07/2020 normal LVEF moderate LVH  COPD on home O2  IDDM  Hypertension  HLD  CKD stage II  PUD with GI bleed 10/2019 requiring transfusions  Obesity class  III   Shared Decision Making/Informed Consent   {Are you ordering a CV Procedure (e.g. stress test, cath, DCCV, TEE, etc)?   Press F2        :[id number]}    Medication Adjustments/Labs and Tests Ordered: Current medicines are reviewed at length with the patient today.  Concerns regarding medicines are outlined above.  Medication changes, Labs and Tests ordered today are listed in the Patient Instructions below. There are no Patient Instructions on file for this visit.   Signed, Jacolyn Reedy, PA-C  09/18/2020 10:35 AM    [facility] [address], [place], [place]  [address] Phone: [phone]; Fax: [phone]

## 2020-09-24 ENCOUNTER — Ambulatory Visit: Payer: Medicare Other | Admitting: Physician Assistant

## 2020-09-24 ENCOUNTER — Telehealth: Payer: Self-pay | Admitting: Cardiology

## 2020-09-24 DIAGNOSIS — I5032 Chronic diastolic (congestive) heart failure: Secondary | ICD-10-CM

## 2020-09-24 DIAGNOSIS — E7849 Other hyperlipidemia: Secondary | ICD-10-CM

## 2020-09-24 DIAGNOSIS — N182 Chronic kidney disease, stage 2 (mild): Secondary | ICD-10-CM

## 2020-09-24 DIAGNOSIS — K279 Peptic ulcer, site unspecified, unspecified as acute or chronic, without hemorrhage or perforation: Secondary | ICD-10-CM

## 2020-09-24 DIAGNOSIS — E119 Type 2 diabetes mellitus without complications: Secondary | ICD-10-CM

## 2020-09-24 DIAGNOSIS — I10 Essential (primary) hypertension: Secondary | ICD-10-CM

## 2020-09-24 DIAGNOSIS — R072 Precordial pain: Secondary | ICD-10-CM

## 2020-09-24 DIAGNOSIS — J449 Chronic obstructive pulmonary disease, unspecified: Secondary | ICD-10-CM

## 2020-09-24 NOTE — Telephone Encounter (Signed)
Patient states he only wants to see Dr. Harl Alvarado.  Patient was at [facility] for the [employer] and had a heart attack.  Stents were put in.  He needs a follow up as soon as possible.  Next opening is in April. Please advise

## 2020-09-24 NOTE — Telephone Encounter (Signed)
I will forward to Dr Branch 

## 2020-09-25 DIAGNOSIS — D72829 Elevated white blood cell count, unspecified: Secondary | ICD-10-CM | POA: Diagnosis not present

## 2020-09-25 DIAGNOSIS — E782 Mixed hyperlipidemia: Secondary | ICD-10-CM | POA: Diagnosis not present

## 2020-09-25 DIAGNOSIS — G4700 Insomnia, unspecified: Secondary | ICD-10-CM | POA: Diagnosis not present

## 2020-09-25 DIAGNOSIS — G894 Chronic pain syndrome: Secondary | ICD-10-CM | POA: Diagnosis not present

## 2020-09-25 DIAGNOSIS — R0902 Hypoxemia: Secondary | ICD-10-CM | POA: Diagnosis not present

## 2020-09-25 DIAGNOSIS — E1129 Type 2 diabetes mellitus with other diabetic kidney complication: Secondary | ICD-10-CM | POA: Diagnosis not present

## 2020-09-25 DIAGNOSIS — M79606 Pain in leg, unspecified: Secondary | ICD-10-CM | POA: Diagnosis not present

## 2020-09-25 DIAGNOSIS — Z720 Tobacco use: Secondary | ICD-10-CM | POA: Diagnosis not present

## 2020-09-25 DIAGNOSIS — J441 Chronic obstructive pulmonary disease with (acute) exacerbation: Secondary | ICD-10-CM | POA: Diagnosis not present

## 2020-09-25 DIAGNOSIS — D509 Iron deficiency anemia, unspecified: Secondary | ICD-10-CM | POA: Diagnosis not present

## 2020-09-25 DIAGNOSIS — I10 Essential (primary) hypertension: Secondary | ICD-10-CM | POA: Diagnosis not present

## 2020-09-25 NOTE — Telephone Encounter (Signed)
Can do Jan 20 at 1140, I believe that's in [place] but please double check.   Zandra Abts MD

## 2020-09-26 NOTE — Telephone Encounter (Signed)
I spoke with patient and he will go to the [place] office on 10/03/20 at 1140 am in the [place] office.

## 2020-10-03 ENCOUNTER — Encounter: Payer: Self-pay | Admitting: Cardiology

## 2020-10-03 ENCOUNTER — Ambulatory Visit: Payer: Medicare Other | Admitting: Cardiology

## 2020-10-03 ENCOUNTER — Encounter: Payer: Self-pay | Admitting: *Deleted

## 2020-10-03 VITALS — BP 124/60 | HR 88 | Ht 72.0 in | Wt 286.6 lb

## 2020-10-03 DIAGNOSIS — I5022 Chronic systolic (congestive) heart failure: Secondary | ICD-10-CM | POA: Diagnosis not present

## 2020-10-03 DIAGNOSIS — I2510 Atherosclerotic heart disease of native coronary artery without angina pectoris: Secondary | ICD-10-CM

## 2020-10-03 DIAGNOSIS — I10 Essential (primary) hypertension: Secondary | ICD-10-CM

## 2020-10-03 MED ORDER — FUROSEMIDE 40 MG PO TABS: 40.0000 mg | ORAL_TABLET | Freq: Every day | ORAL | 1 refills | Status: DC | PRN

## 2020-10-03 MED ORDER — METOPROLOL SUCCINATE ER 50 MG PO TB24: 50.0000 mg | ORAL_TABLET | Freq: Every day | ORAL | 1 refills | Status: DC

## 2020-10-03 NOTE — Patient Instructions (Signed)
Your physician recommends that you schedule a follow-up appointment in: [place] physician has recommended you make the following change in your medication:   STOP CARVEDILOL   START LASIX 40 MG AS NEEDED FOR SWELLING   START TOPROL XL 50 MG DAILY   Your physician recommends that you return for lab work NEXT WEEK CBC/BMP/MG  Thank you for choosing [facility]!!

## 2020-10-03 NOTE — Progress Notes (Signed)
Clinical Summary Mr. Kretschmer is a 70 y.o.male seen today for follow up of the following medical problems.  1. CAD   - from notes initially chest pain and dyspnea, became unresponsive en route to ED - intubated in ER. Notes mention issues with COPD exacerbation vs aspiration event, no mention of a cardiac arrest  - diagnosed with NSTEMI during admission, had cath and 3 stents.  - admitted 12/29 to Jan 9  09/12/20 cath: LM LIs, LAD prox 70% and 90% mid, LCX 80% prox with thrombus, OM2 85%, RCA small non dom 60%. LVEDP elevated but number not given,  - received DES to prox/mid LAD, DES to prox LCX, DES to OM2.  08/2020 echo: LVEF 35-40%< grade II DDx, mild MR  - no recent chest pains  2. COPD - severe COPD exacerbation during jan 2022 admission in [place], [place] requiring intubation     3. SOB/Leg swelling -cramps with low dose furosemide and torsemide, he has stopped taking - 08/2018 initial echo poor visualization,suggestion LVEF 40-45%, grade I diastoilc dysfunction - 08/2018 limited contrast echo LVEF 55-60%, no WMAs. 08/2020 echo: LVEF 35-40%< grade II DDx, mild MR  - compliant with meds   4.Anemia - admission 10/2019 with symptomatic anemia and GI bleed - heavy bc powder use at home at the time - Hgb 5.4 on admissoin. Given 3 units pRBCs - EGD with gastric ulcers   5. HTN - compliant with meds    Past Medical History:  Diagnosis Date  . Anemia   . Arthritis    left shoulder  . COPD (chronic obstructive pulmonary disease) ([place])   . Diabetes mellitus without complication ([place])   . Dyslipidemia   . Hypertension   . Insomnia   . Insomnia   . Morbid obesity ([place])   . Tobacco use      Allergies  Allergen Reactions  . Caduet [Amlodipine-Atorvastatin] Swelling     Current Outpatient Medications  Medication Sig Dispense Refill  . acetaminophen (TYLENOL) 325 MG tablet Take 2 tablets (650 mg total) by mouth every 6 (six) hours as needed for  mild pain (or Fever >/= 101). 12 tablet 0  . albuterol (VENTOLIN HFA) 108 (90 Base) MCG/ACT inhaler Inhale 2 puffs into the lungs every 6 (six) hours as needed for wheezing or shortness of breath. 18 g 0  . atorvastatin (LIPITOR) 40 MG tablet Take 40 mg by mouth at bedtime.    . cyanocobalamin (,VITAMIN B-12,) 1000 MCG/ML injection Inject 1,000 mcg into the muscle every 30 (thirty) days.     Marland Kitchen docusate sodium (COLACE) 100 MG capsule Take 1 capsule (100 mg total) by mouth daily. 60 capsule 1  . folic acid (FOLVITE) 1 MG tablet Take 1 tablet (1 mg total) by mouth daily. (Patient taking differently: Take 1 mg by mouth at bedtime.) 30 tablet 2  . gabapentin (NEURONTIN) 100 MG capsule Take 100 mg by mouth daily.    . hydrochlorothiazide (HYDRODIURIL) 12.5 MG tablet Take 12.5 mg by mouth at bedtime.     . insulin NPH-regular Human (NOVOLIN 70/30) (70-30) 100 UNIT/ML injection Inject 50 Units into the skin in the morning and at bedtime. Inject 50 units in morning and 50 units every evening    . Insulin Pen Needle (B-D ULTRAFINE III SHORT PEN) 31G X 8 MM MISC 1 each by Does not apply route as directed. 100 each 3  . INSULIN SYRINGE .5CC/29G 29G X 1/2" 0.5 ML MISC Use to inject insulin  2 times a day 100 each 3  . isosorbide mononitrate (IMDUR) 30 MG 24 hr tablet Take 1 tablet (30 mg total) by mouth daily. May take 2 if needed 180 tablet 3  . lisinopril (PRINIVIL,ZESTRIL) 20 MG tablet Take 1 tablet (20 mg total) by mouth daily. 30 tablet 6  . naproxen (NAPROSYN) 500 MG tablet Take 500 mg by mouth 2 (two) times daily.    . nitroGLYCERIN (NITROSTAT) 0.4 MG SL tablet DISSOLVE 1 TABLET SUBLINGUALLY AS NEEDED FOR CHEST PAIN, MAY REPEAT EVERY 5 MINUTES. AFTER 3 CALL 911. 25 tablet 3  . ONE TOUCH ULTRA TEST test strip TEST FOUR TIMES DAILY AS DIRECTED. 150 each 5  . pantoprazole (PROTONIX) 40 MG tablet Take 1 tablet (40 mg total) by mouth 2 (two) times daily before a meal. 60 tablet 5  . polyethylene glycol (MIRALAX  / GLYCOLAX) 17 g packet Take 17 g by mouth daily. 28 each 1  . traMADol (ULTRAM) 50 MG tablet Take 50 mg by mouth in the morning and at bedtime.     Marland Kitchen zolpidem (AMBIEN) 10 MG tablet Take 10 mg by mouth at bedtime.     No current facility-administered medications for this visit.     Past Surgical History:  Procedure Laterality Date  . BIOPSY  10/28/2019   Procedure: BIOPSY;  Surgeon: Danie Binder, MD;  [facility];  Service: Endoscopy;;  gastric  . BIOPSY  04/15/2020   Procedure: BIOPSY;  Surgeon: Daneil Dolin, MD;  [facility];  Service: Endoscopy;;  gastric  . COLON RESECTION  1990s?   DIVERTICULITIS  . COLONOSCOPY WITH PROPOFOL N/A 04/15/2020   Rourk: Poor colon prep. two 6 to 8 mm polyps removed from the hepatic flexure, tubular adenomas.  Repeat colonoscopy later this year.  . ESOPHAGOGASTRODUODENOSCOPY (EGD) WITH PROPOFOL N/A 10/28/2019   Dr. Oneida Alar: Multiple cratered gastric ulcers, gastritis/duodenitis.  Path showed H. pylori.  Patient treated with amoxicillin/Biaxin/PPI twice daily.  . ESOPHAGOGASTRODUODENOSCOPY (EGD) WITH PROPOFOL N/A 04/15/2020   Rourk: Patchy gastric erythema status post biopsy to document eradication of H. pylori.  Biopsy showed persistent H. pylori.  Previously noted gastric ulcer is completely healed.  . 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  Marland Kitchen HERNIA REPAIR     UHR  . MALONEY DILATION N/A 04/15/2020   Procedure: Venia Minks DILATION;  Surgeon: Daneil Dolin, MD;  [facility];  Service: Endoscopy;  Laterality: N/A;  . POLYPECTOMY  04/15/2020   Procedure: POLYPECTOMY;  Surgeon: Daneil Dolin, MD;  [facility];  Service: Endoscopy;;  colon  . ROTATOR CUFF REPAIR Bilateral   . TYMPANOPLASTY       Allergies  Allergen Reactions  . Caduet [Amlodipine-Atorvastatin] Swelling      Family History   Problem Relation Age of Onset  . Diabetes Mother   . Heart attack Mother   . Heart attack Father   . Heart attack Brother   . Colon cancer Neg Hx   . Stomach cancer Neg Hx      Social History Mr. Widmeyer reports that he has been smoking cigarettes. He has been smoking about 2.00 packs per day. He has never used smokeless tobacco. Mr. Greenlief reports previous alcohol use.   Review of Systems CONSTITUTIONAL: No weight loss, fever, chills, weakness or fatigue.  HEENT: Eyes: No visual loss, blurred vision, double vision or yellow  sclerae.No hearing loss, sneezing, congestion, runny nose or sore throat.  SKIN: No rash or itching.  CARDIOVASCULAR: per hpi RESPIRATORY: No shortness of breath, cough or sputum.  GASTROINTESTINAL: No anorexia, nausea, vomiting or diarrhea. No abdominal pain or blood.  GENITOURINARY: No burning on urination, no polyuria NEUROLOGICAL: No headache, dizziness, syncope, paralysis, ataxia, numbness or tingling in the extremities. No change in bowel or bladder control.  MUSCULOSKELETAL: No muscle, back pain, joint pain or stiffness.  LYMPHATICS: No enlarged nodes. No history of splenectomy.  PSYCHIATRIC: No history of depression or anxiety.  ENDOCRINOLOGIC: No reports of sweating, cold or heat intolerance. No polyuria or polydipsia.  Marland Kitchen   Physical Examination Today's Vitals   10/03/20 1054  BP: 124/60  Pulse: 88  SpO2: 91%  Weight: 286 lb 9.6 oz (130 kg)  Height: 6' (1.829 m)   Body mass index is 38.87 kg/m.  Gen: resting comfortably, no acute distress HEENT: no scleral icterus, pupils equal round and reactive, no palptable cervical adenopathy,  CV: RRR, no mrg, no jvd Resp: Clear to auscultation bilaterally GI: abdomen is soft, non-tender, non-distended, normal bowel sounds, no hepatosplenomegaly MSK: extremities are warm, no edema.  Skin: warm, no rash Neuro:  no focal deficits Psych: appropriate affect   Diagnostic Studies 08/2018 echo Study  Conclusions  - Left ventricle: The cavity size was normal. Wall thickness was increased increased in a pattern of mild to moderate LVH. Systolic function was mildly to moderately reduced. The estimated ejection fraction was in the range of 40% to 45%. Diffuse hypokinesis. Doppler parameters are consistent with abnormal left ventricular relaxation (grade 1 diastolic dysfunction). Doppler parameters are consistent with high ventricular filling pressure. - Aortic valve: Valve area (VTI): 3.22 cm^2. Valve area (Vmax): 2.74 cm^2. Valve area (Vmean): 2.29 cm^2. - Left atrium: The atrium was moderately dilated. - Atrial septum: No defect or patent foramen ovale was identified. - Techncically difficult study, recommend limited study with echocontrast to better clarify LVEF. Based on available images appears mildly decreased.  08/2018 contrast echo Study Conclusions  - Left ventricle: Limited study with Definity contrast. Systolic function was normal. The estimated ejection fraction was in the range of 55% to 60%. Wall motion was normal; there were no regional wall motion abnormalities. - Mitral valve: There was trivial regurgitation. - Pericardium, extracardiac: A prominent pericardial fat pad was present.  07/2020 echo IMPRESSIONS    1. Left ventricular ejection fraction, by estimation, is 55 to 60%. The  left ventricle has normal function. The left ventricle has no regional  wall motion abnormalities. There is moderate left ventricular hypertrophy.  Left ventricular diastolic  parameters are indeterminate.  2. Right ventricular systolic function is normal. The right ventricular  size is normal.  3. The mitral valve is normal in structure. Mild mitral valve  regurgitation. No evidence of mitral stenosis.  4. The aortic valve is tricuspid. There is mild calcification of the  aortic valve. There is mild thickening of the aortic valve. Aortic valve   regurgitation is not visualized. No aortic stenosis is present.  5. The inferior vena cava is normal in size with greater than 50%  respiratory variability, suggesting right atrial pressure of 3 mmHg.     Assessment and Plan  1.CAD/ICM/Chronic systolic HF - no significant symptoms - with severe COPD will change his coreg to toprol 50mg  daily - start lasix 40mg  prn. Recheck labs bmet/mg/cbc - titrate CHF meds further as tolerated at f/u    Arnoldo Lenis, M.D.

## 2020-10-06 DIAGNOSIS — J449 Chronic obstructive pulmonary disease, unspecified: Secondary | ICD-10-CM | POA: Diagnosis not present

## 2020-10-07 ENCOUNTER — Encounter: Payer: Self-pay | Admitting: Internal Medicine

## 2020-10-08 ENCOUNTER — Telehealth (HOSPITAL_COMMUNITY): Payer: Self-pay

## 2020-10-08 NOTE — Telephone Encounter (Signed)
Called and spoke with pt in regards to CR, pt stated he is not interested in CR at this time.  Closed referral

## 2020-10-10 DIAGNOSIS — G43001 Migraine without aura, not intractable, with status migrainosus: Secondary | ICD-10-CM | POA: Diagnosis not present

## 2020-10-23 ENCOUNTER — Ambulatory Visit: Payer: Medicare Other | Admitting: Cardiology

## 2020-10-23 ENCOUNTER — Other Ambulatory Visit: Payer: Self-pay

## 2020-10-23 ENCOUNTER — Encounter: Payer: Self-pay | Admitting: Cardiology

## 2020-10-23 VITALS — BP 130/64 | HR 76 | Ht 72.0 in | Wt 286.8 lb

## 2020-10-23 DIAGNOSIS — I2510 Atherosclerotic heart disease of native coronary artery without angina pectoris: Secondary | ICD-10-CM

## 2020-10-23 DIAGNOSIS — I5022 Chronic systolic (congestive) heart failure: Secondary | ICD-10-CM | POA: Diagnosis not present

## 2020-10-23 MED ORDER — METOPROLOL SUCCINATE ER 100 MG PO TB24: 100.0000 mg | ORAL_TABLET | Freq: Every day | ORAL | 3 refills | Status: DC

## 2020-10-23 NOTE — Progress Notes (Signed)
Clinical Summary Gregory Alvarado is a 70 y.o.male seen today for follow up of the following medical problems.This is a focused visit on recent MI and diagnosis of systolic HF.   1. CAD/ICM/Chronic systolic HF - from notes initially chest pain and dyspnea, became unresponsive en route to ED - intubated in ER. Notes mention issues with COPD exacerbation vs aspiration event, no mention of a cardiac arrest  - diagnosed with NSTEMI during admission, had cath and 3 stents.  - admitted 12/29 to Jan 9  09/12/20 cath: LM LIs, LAD prox 70% and 90% mid, LCX 80% prox with thrombus, OM2 85%, RCA small non dom 60%. LVEDP elevated but number not given,  - received DES to prox/mid LAD, DES to prox LCX, DES to OM2.  08/2020 echo: LVEF 35-40% grade II DDx, mild MR  - last visit we stopped coreg and changed to toprol due to his COPD history - started on lasix 40mg  prn - since last visit no specific chest pains.     2. COPD - severe COPD exacerbation during jan 2022 admission in [place], [place] requiring intubation     Past Medical History:  Diagnosis Date  . Anemia   . Arthritis    left shoulder  . COPD (chronic obstructive pulmonary disease) ([place])   . Diabetes mellitus without complication ([place])   . Dyslipidemia   . Hypertension   . Insomnia   . Insomnia   . Morbid obesity ([place])   . Tobacco use      Allergies  Allergen Reactions  . Caduet [Amlodipine-Atorvastatin] Swelling     Current Outpatient Medications  Medication Sig Dispense Refill  . acetaminophen (TYLENOL) 325 MG tablet Take 2 tablets (650 mg total) by mouth every 6 (six) hours as needed for mild pain (or Fever >/= 101). 12 tablet 0  . albuterol (VENTOLIN HFA) 108 (90 Base) MCG/ACT inhaler Inhale 2 puffs into the lungs every 6 (six) hours as needed for wheezing or shortness of breath. 18 g 0  . atorvastatin (LIPITOR) 40 MG tablet Take 40 mg by mouth at bedtime.    . cyanocobalamin (,VITAMIN B-12,) 1000 MCG/ML  injection Inject 1,000 mcg into the muscle every 30 (thirty) days.     Marland Kitchen docusate sodium (COLACE) 100 MG capsule Take 1 capsule (100 mg total) by mouth daily. 60 capsule 1  . folic acid (FOLVITE) 1 MG tablet Take 1 tablet (1 mg total) by mouth daily. (Patient taking differently: Take 1 mg by mouth at bedtime.) 30 tablet 2  . furosemide (LASIX) 40 MG tablet Take 1 tablet (40 mg total) by mouth daily as needed. 90 tablet 1  . gabapentin (NEURONTIN) 100 MG capsule Take 100 mg by mouth daily.    . hydrochlorothiazide (HYDRODIURIL) 12.5 MG tablet Take 12.5 mg by mouth at bedtime.     . insulin NPH-regular Human (NOVOLIN 70/30) (70-30) 100 UNIT/ML injection Inject 50 Units into the skin in the morning and at bedtime. Inject 50 units in morning and 50 units every evening    . Insulin Pen Needle (B-D ULTRAFINE III SHORT PEN) 31G X 8 MM MISC 1 each by Does not apply route as directed. 100 each 3  . INSULIN SYRINGE .5CC/29G 29G X 1/2" 0.5 ML MISC Use to inject insulin 2 times a day 100 each 3  . isosorbide mononitrate (IMDUR) 30 MG 24 hr tablet Take 1 tablet (30 mg total) by mouth daily. May take 2 if needed 180 tablet 3  .  lisinopril (PRINIVIL,ZESTRIL) 20 MG tablet Take 1 tablet (20 mg total) by mouth daily. 30 tablet 6  . metoprolol succinate (TOPROL-XL) 50 MG 24 hr tablet Take 1 tablet (50 mg total) by mouth daily. Take with or immediately following a meal. 90 tablet 1  . naproxen (NAPROSYN) 500 MG tablet Take 500 mg by mouth 2 (two) times daily.    . nitroGLYCERIN (NITROSTAT) 0.4 MG SL tablet DISSOLVE 1 TABLET SUBLINGUALLY AS NEEDED FOR CHEST PAIN, MAY REPEAT EVERY 5 MINUTES. AFTER 3 CALL 911. 25 tablet 3  . ONE TOUCH ULTRA TEST test strip TEST FOUR TIMES DAILY AS DIRECTED. 150 each 5  . pantoprazole (PROTONIX) 40 MG tablet Take 1 tablet (40 mg total) by mouth 2 (two) times daily before a meal. 60 tablet 5  . polyethylene glycol (MIRALAX / GLYCOLAX) 17 g packet Take 17 g by mouth daily. 28 each 1  .  prasugrel (EFFIENT) 10 MG TABS tablet Take 10 mg by mouth daily.    Marland Kitchen SPIRIVA HANDIHALER 18 MCG inhalation capsule Place 1 capsule into inhaler and inhale daily.    Marland Kitchen spironolactone (ALDACTONE) 25 MG tablet Take 25 mg by mouth daily.    . SYMBICORT 160-4.5 MCG/ACT inhaler Inhale 1 puff into the lungs daily.    . traMADol (ULTRAM) 50 MG tablet Take 50 mg by mouth in the morning and at bedtime.     Marland Kitchen zolpidem (AMBIEN) 5 MG tablet Take 5 mg by mouth at bedtime as needed.     No current facility-administered medications for this visit.     Past Surgical History:  Procedure Laterality Date  . BIOPSY  10/28/2019   Procedure: BIOPSY;  Surgeon: Danie Binder, MD;  [facility];  Service: Endoscopy;;  gastric  . BIOPSY  04/15/2020   Procedure: BIOPSY;  Surgeon: Daneil Dolin, MD;  [facility];  Service: Endoscopy;;  gastric  . COLON RESECTION  1990s?   DIVERTICULITIS  . COLONOSCOPY WITH PROPOFOL N/A 04/15/2020   Rourk: Poor colon prep. two 6 to 8 mm polyps removed from the hepatic flexure, tubular adenomas.  Repeat colonoscopy later this year.  . ESOPHAGOGASTRODUODENOSCOPY (EGD) WITH PROPOFOL N/A 10/28/2019   Dr. Oneida Alar: Multiple cratered gastric ulcers, gastritis/duodenitis.  Path showed H. pylori.  Patient treated with amoxicillin/Biaxin/PPI twice daily.  . ESOPHAGOGASTRODUODENOSCOPY (EGD) WITH PROPOFOL N/A 04/15/2020   Rourk: Patchy gastric erythema status post biopsy to document eradication of H. pylori.  Biopsy showed persistent H. pylori.  Previously noted gastric ulcer is completely healed.  . 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  Marland Kitchen HERNIA REPAIR     UHR  . MALONEY DILATION N/A 04/15/2020   Procedure: Venia Minks DILATION;  Surgeon: Daneil Dolin, MD;  [facility];  Service: Endoscopy;  Laterality: N/A;  . POLYPECTOMY  04/15/2020   Procedure:  POLYPECTOMY;  Surgeon: Daneil Dolin, MD;  [facility];  Service: Endoscopy;;  colon  . ROTATOR CUFF REPAIR Bilateral   . TYMPANOPLASTY       Allergies  Allergen Reactions  . Caduet [Amlodipine-Atorvastatin] Swelling      Family History  Problem Relation Age of Onset  . Diabetes Mother   . Heart attack Mother   . Heart attack Father   . Heart attack Brother   . Colon cancer Neg Hx   . Stomach cancer Neg Hx  Social History Mr. Nickson reports that he has been smoking cigarettes. He has been smoking about 2.00 packs per day. He has never used smokeless tobacco. Mr. Tenbrink reports previous alcohol use.   Review of Systems CONSTITUTIONAL: No weight loss, fever, chills, weakness or fatigue.  HEENT: Eyes: No visual loss, blurred vision, double vision or yellow sclerae.No hearing loss, sneezing, congestion, runny nose or sore throat.  SKIN: No rash or itching.  CARDIOVASCULAR:per hpi  RESPIRATORY: No shortness of breath, cough or sputum.  GASTROINTESTINAL: No anorexia, nausea, vomiting or diarrhea. No abdominal pain or blood.  GENITOURINARY: No burning on urination, no polyuria NEUROLOGICAL: No headache, dizziness, syncope, paralysis, ataxia, numbness or tingling in the extremities. No change in bowel or bladder control.  MUSCULOSKELETAL: No muscle, back pain, joint pain or stiffness.  LYMPHATICS: No enlarged nodes. No history of splenectomy.  PSYCHIATRIC: No history of depression or anxiety.  ENDOCRINOLOGIC: No reports of sweating, cold or heat intolerance. No polyuria or polydipsia.  Marland Kitchen   Physical Examination Today's Vitals   10/23/20 0820  BP: 130/64  Pulse: 76  SpO2: 100%  Weight: 286 lb 12.8 oz (130.1 kg)  Height: 6' (1.829 m)   Body mass index is 38.9 kg/m.  Gen: resting comfortably, no acute distress HEENT: no scleral icterus, pupils equal round and reactive, no palptable cervical adenopathy,  CV: RRR, no m/r/g, no jvd Resp: Clear to  auscultation bilaterally GI: abdomen is soft, non-tender, non-distended, normal bowel sounds, no hepatosplenomegaly MSK: extremities are warm, no edema.  Skin: warm, no rash Neuro:  no focal deficits Psych: appropriate affect   Diagnostic Studies 08/2018 echo Study Conclusions  - Left ventricle: The cavity size was normal. Wall thickness was increased increased in a pattern of mild to moderate LVH. Systolic function was mildly to moderately reduced. The estimated ejection fraction was in the range of 40% to 45%. Diffuse hypokinesis. Doppler parameters are consistent with abnormal left ventricular relaxation (grade 1 diastolic dysfunction). Doppler parameters are consistent with high ventricular filling pressure. - Aortic valve: Valve area (VTI): 3.22 cm^2. Valve area (Vmax): 2.74 cm^2. Valve area (Vmean): 2.29 cm^2. - Left atrium: The atrium was moderately dilated. - Atrial septum: No defect or patent foramen ovale was identified. - Techncically difficult study, recommend limited study with echocontrast to better clarify LVEF. Based on available images appears mildly decreased.  08/2018 contrast echo Study Conclusions  - Left ventricle: Limited study with Definity contrast. Systolic function was normal. The estimated ejection fraction was in the range of 55% to 60%. Wall motion was normal; there were no regional wall motion abnormalities. - Mitral valve: There was trivial regurgitation. - Pericardium, extracardiac: A prominent pericardial fat pad was present.  07/2020 echo IMPRESSIONS    1. Left ventricular ejection fraction, by estimation, is 55 to 60%. The  left ventricle has normal function. The left ventricle has no regional  wall motion abnormalities. There is moderate left ventricular hypertrophy.  Left ventricular diastolic  parameters are indeterminate.  2. Right ventricular systolic function is normal. The right ventricular   size is normal.  3. The mitral valve is normal in structure. Mild mitral valve  regurgitation. No evidence of mitral stenosis.  4. The aortic valve is tricuspid. There is mild calcification of the  aortic valve. There is mild thickening of the aortic valve. Aortic valve  regurgitation is not visualized. No aortic stenosis is present.  5. The inferior vena cava is normal in size with greater than 50%  respiratory variability, suggesting  right atrial pressure of 3 mmHg.     Assessment and Plan  1. CAD/ICM/Chronic systolic HF - no recent symptoms - working to titrate medications, recheck echo later March/early April - increase toprol to 100mg  daily. If repeat echo with ongoing dysfunction would change ACE to entresto - f/u 3 weeks, try to further titrate beta blocker.       Arnoldo Lenis, M.D.

## 2020-10-23 NOTE — Patient Instructions (Signed)
Medication Instructions:  Your physician has recommended you make the following change in your medication:   Increase Toprol XL to 100 mg Daily   *If you need a refill on your cardiac medications before your next appointment, please call your pharmacy*   Lab Work: NONE   If you have labs (blood work) drawn today and your tests are completely normal, you will receive your results only by:  [place] (if you have MyChart) OR  A paper copy in the mail If you have any lab test that is abnormal or we need to change your treatment, we will call you to review the results.   Testing/Procedures: NONE    Follow-Up: At [facility], you and your health needs are our priority.  As part of our continuing mission to provide you with exceptional heart care, we have created designated Provider Care Teams.  These Care Teams include your primary Cardiologist (physician) and Advanced Practice Providers (APPs -  Physician Assistants and Nurse Practitioners) who all work together to provide you with the care you need, when you need it.  We recommend signing up for the patient portal called "MyChart".  Sign up information is provided on this After Visit Summary.  MyChart is used to connect with patients for Virtual Visits (Telemedicine).  Patients are able to view lab/test results, encounter notes, upcoming appointments, etc.  Non-urgent messages can be sent to your provider as well.   To learn more about what you can do with MyChart, go to [email or web address].    Your next appointment:   3 week(s)  The format for your next appointment:   In Person  Provider:   You will see one of the following Advanced Practice Providers on your designated Care Team:    Bernerd Pho, PA-C   Ermalinda Barrios, PA-C    Other Instructions Thank you for choosing [facility]!

## 2020-10-26 ENCOUNTER — Other Ambulatory Visit: Payer: Self-pay | Admitting: Cardiology

## 2020-10-28 DIAGNOSIS — M792 Neuralgia and neuritis, unspecified: Secondary | ICD-10-CM | POA: Diagnosis not present

## 2020-11-06 DIAGNOSIS — J449 Chronic obstructive pulmonary disease, unspecified: Secondary | ICD-10-CM | POA: Diagnosis not present

## 2020-11-11 ENCOUNTER — Other Ambulatory Visit: Payer: Self-pay | Admitting: Cardiology

## 2020-11-12 ENCOUNTER — Ambulatory Visit: Payer: Medicare Other | Admitting: Gastroenterology

## 2020-11-13 ENCOUNTER — Ambulatory Visit: Payer: Medicare Other | Admitting: Physician Assistant

## 2020-11-14 ENCOUNTER — Other Ambulatory Visit: Payer: Self-pay

## 2020-11-14 ENCOUNTER — Encounter (HOSPITAL_COMMUNITY)
Admission: RE | Admit: 2020-11-14 | Discharge: 2020-11-14 | Disposition: A | Discharge status: Home / Self Care | Payer: Medicare Other | Source: Ambulatory Visit | Attending: Cardiology | Admitting: Cardiology

## 2020-11-14 ENCOUNTER — Encounter (HOSPITAL_COMMUNITY): Payer: Self-pay

## 2020-11-14 VITALS — BP 108/52 | HR 77 | Ht 72.0 in | Wt 291.9 lb

## 2020-11-14 DIAGNOSIS — I214 Non-ST elevation (NSTEMI) myocardial infarction: Secondary | ICD-10-CM | POA: Insufficient documentation

## 2020-11-14 DIAGNOSIS — Z955 Presence of coronary angioplasty implant and graft: Secondary | ICD-10-CM | POA: Diagnosis not present

## 2020-11-14 LAB — GLUCOSE, CAPILLARY
Glucose-Capillary: 106 mg/dL — ABNORMAL HIGH (ref 70–99)
Glucose-Capillary: 123 mg/dL — ABNORMAL HIGH (ref 70–99)

## 2020-11-14 NOTE — Progress Notes (Signed)
Cardiac Individual Treatment Plan  Patient Details  Name: Gregory Alvarado MRN: [id number] Date of Birth: July 12, 1951 Referring Provider:   Flowsheet Row CARDIAC REHAB PHASE II ORIENTATION from 11/14/2020 in [place]  Referring Provider Dr. Harl Bowie      Initial Encounter Date:  Flowsheet Row CARDIAC REHAB PHASE II ORIENTATION from 11/14/2020 in [place]  Date 11/14/20      Visit Diagnosis: NSTEMI (non-ST elevated myocardial infarction) [facility])  S/P coronary artery stent placement  Patient's Home Medications on Admission:  Current Outpatient Medications:  .  acetaminophen (TYLENOL) 325 MG tablet, Take 2 tablets (650 mg total) by mouth every 6 (six) hours as needed for mild pain (or Fever >/= 101)., Disp: 12 tablet, Rfl: 0 .  aspirin EC 81 MG tablet, Take 81 mg by mouth daily. Swallow whole., Disp: , Rfl:  .  atorvastatin (LIPITOR) 40 MG tablet, Take 40 mg by mouth at bedtime., Disp: , Rfl:  .  cyanocobalamin (,VITAMIN B-12,) 1000 MCG/ML injection, Inject 1,000 mcg into the muscle every 30 (thirty) days. , Disp: , Rfl:  .  docusate sodium (COLACE) 100 MG capsule, Take 1 capsule (100 mg total) by mouth daily., Disp: 60 capsule, Rfl: 1 .  folic acid (FOLVITE) 1 MG tablet, Take 1 tablet (1 mg total) by mouth daily. (Patient taking differently: Take 1 mg by mouth at bedtime.), Disp: 30 tablet, Rfl: 2 .  furosemide (LASIX) 40 MG tablet, Take 1 tablet (40 mg total) by mouth daily as needed., Disp: 90 tablet, Rfl: 1 .  gabapentin (NEURONTIN) 100 MG capsule, Take 300 mg by mouth 2 (two) times daily., Disp: , Rfl:  .  hydrochlorothiazide (HYDRODIURIL) 12.5 MG tablet, Take 12.5 mg by mouth at bedtime. , Disp: , Rfl:  .  insulin NPH-regular Human (NOVOLIN 70/30) (70-30) 100 UNIT/ML injection, Inject 40 Units into the skin in the morning and at bedtime. Inject 40 units in morning and 40 units every evening, Disp: , Rfl:  .  Insulin Pen Needle (B-D ULTRAFINE  III SHORT PEN) 31G X 8 MM MISC, 1 each by Does not apply route as directed., Disp: 100 each, Rfl: 3 .  INSULIN SYRINGE .5CC/29G 29G X 1/2" 0.5 ML MISC, Use to inject insulin 2 times a day, Disp: 100 each, Rfl: 3 .  isosorbide mononitrate (IMDUR) 30 MG 24 hr tablet, Take 1 tablet (30 mg total) by mouth daily. May take 2 if needed, Disp: 180 tablet, Rfl: 3 .  lisinopril (PRINIVIL,ZESTRIL) 20 MG tablet, Take 1 tablet (20 mg total) by mouth daily., Disp: 30 tablet, Rfl: 6 .  metoprolol succinate (TOPROL XL) 100 MG 24 hr tablet, Take 1 tablet (100 mg total) by mouth daily. (Patient taking differently: Take 50 mg by mouth daily.), Disp: 90 tablet, Rfl: 3 .  naproxen (NAPROSYN) 500 MG tablet, Take 500 mg by mouth 2 (two) times daily., Disp: , Rfl:  .  nitroGLYCERIN (NITROSTAT) 0.4 MG SL tablet, DISSOLVE 1 TABLET SUBLINGUALLY AS NEEDED FOR CHEST PAIN, MAY REPEAT EVERY 5 MINUTES. AFTER 3 CALL 911., Disp: 25 tablet, Rfl: 3 .  ONE TOUCH ULTRA TEST test strip, TEST FOUR TIMES DAILY AS DIRECTED., Disp: 150 each, Rfl: 5 .  pantoprazole (PROTONIX) 40 MG tablet, Take 1 tablet (40 mg total) by mouth 2 (two) times daily before a meal., Disp: 60 tablet, Rfl: 5 .  polyethylene glycol (MIRALAX / GLYCOLAX) 17 g packet, Take 17 g by mouth daily., Disp: 28 each, Rfl: 1 .  prasugrel (EFFIENT) 10 MG TABS tablet, Take 10 mg by mouth daily., Disp: , Rfl:  .  SPIRIVA HANDIHALER 18 MCG inhalation capsule, Place 1 capsule into inhaler and inhale daily., Disp: , Rfl:  .  spironolactone (ALDACTONE) 25 MG tablet, TAKE 1 TABLET BY MOUTH ONCE A DAY., Disp: 30 tablet, Rfl: 11 .  SUMAtriptan (IMITREX) 25 MG tablet, Take by mouth., Disp: , Rfl:  .  SYMBICORT 160-4.5 MCG/ACT inhaler, Inhale 2 puffs into the lungs 2 (two) times daily., Disp: , Rfl:  .  traMADol (ULTRAM) 50 MG tablet, Take 50-100 mg by mouth in the morning and at bedtime., Disp: , Rfl:  .  zolpidem (AMBIEN) 5 MG tablet, Take 5 mg by mouth at bedtime as needed., Disp: , Rfl:    Past Medical History: Past Medical History:  Diagnosis Date  . Anemia   . Arthritis    left shoulder  . COPD (chronic obstructive pulmonary disease) ([place])   . Coronary artery disease 09/11/2020   NSTEMI and 3 stents in [facility]  . Diabetes mellitus without complication ([place])   . Dyslipidemia   . Hypertension   . Insomnia   . Insomnia   . Morbid obesity ([place])   . Tobacco use     Tobacco Use: Social History   Tobacco Use  Smoking Status Current Every Day Smoker  . Packs/day: 2.00  . Types: Cigarettes  Smokeless Tobacco Never Used    Labs: Recent Review Flowsheet Data    Labs for ITP Cardiac and Pulmonary Rehab Latest Ref Rng & Units 11/03/2016 02/18/2017 10/26/2019 07/22/2020   Cholestrol 100 - 199 mg/dL - 186 - -   LDLCALC 0 - 99 mg/dL - 92 - -   HDL >39 mg/dL - 28(L) - -   Trlycerides 0 - 149 mg/dL - 329(H) - -   Hemoglobin A1c 4.8 - 5.6 % 13.2 11.3(H) 6.2(H) 5.7(H)      Capillary Blood Glucose: Lab Results  Component Value Date   GLUCAP 123 (H) 11/14/2020   GLUCAP 106 (H) 11/14/2020   GLUCAP 135 (H) 07/30/2020   GLUCAP 132 (H) 07/30/2020   GLUCAP 194 (H) 07/29/2020    POCT Glucose    Row Name 11/14/20 1231             POCT Blood Glucose   Pre-Exercise 106 mg/dL  His blood glucose was 59 when he arrived. He was given orange juice and cheese crackers. He was rechecked about 30 minutes later and was 106. He needed to be at least 110 to exercise. He ate another pack of crackers and was able to increase to 123.              Exercise Target Goals: Exercise Program Goal: Individual exercise prescription set using results from initial 6 min walk test and THRR while considering  patient's activity barriers and safety.   Exercise Prescription Goal: Starting with aerobic activity 30 plus minutes a day, 3 days per week for initial exercise prescription. Provide home exercise prescription and guidelines that participant acknowledges understanding prior to  discharge.  Activity Barriers & Risk Stratification:  Activity Barriers & Cardiac Risk Stratification - 11/14/20 0831      Activity Barriers & Cardiac Risk Stratification   Activity Barriers Back Problems;Joint Problems;Deconditioning;Shortness of Breath;Balance Concerns;History of Falls    Cardiac Risk Stratification High           6 Minute Walk:  6 Minute Walk    Row Name 11/14/20 1026  6 Minute Walk   Phase Initial     Distance 500 feet     Walk Time 6 minutes     # of Rest Breaks 1  took a 3 min break     MPH 0.9     METS 0.81     RPE 9     VO2 Peak 2.84     Symptoms Yes (comment)     Comments pain in quads 7/10     Resting HR 76 bpm     Resting BP 108/52     Resting Oxygen Saturation  98 %     Exercise Oxygen Saturation  during 6 min walk 98 %     Max Ex. HR 93 bpm     Max Ex. BP 122/48     2 Minute Post BP 102/48            Oxygen Initial Assessment:   Oxygen Re-Evaluation:   Oxygen Discharge (Final Oxygen Re-Evaluation):   Initial Exercise Prescription:  Initial Exercise Prescription - 11/14/20 1000      Date of Initial Exercise RX and Referring Provider   Date 11/14/20    Referring Provider Dr. Harl Bowie    Expected Discharge Date 02/05/21      NuStep   Level 1    SPM 60    Minutes 39      Prescription Details   Frequency (times per week) 3    Duration Progress to 30 minutes of continuous aerobic without signs/symptoms of physical distress      Intensity   THRR 40-80% of Max Heartrate 61-122    Ratings of Perceived Exertion 11-13      Progression   Progression Continue progressive overload as per policy without signs/symptoms or physical distress.      Resistance Training   Training Prescription Yes    Weight 3 lbs    Reps 10-15           Perform Capillary Blood Glucose checks as needed.  Exercise Prescription Changes:   Exercise Comments:   Exercise Goals and Review:  Exercise Goals    Row Name 11/14/20 1029              Exercise Goals   Increase Physical Activity Yes       Intervention Provide advice, education, support and counseling about physical activity/exercise needs.;Develop an individualized exercise prescription for aerobic and resistive training based on initial evaluation findings, risk stratification, comorbidities and participant's personal goals.       Expected Outcomes Short Term: Attend rehab on a regular basis to increase amount of physical activity.;Long Term: Add in home exercise to make exercise part of routine and to increase amount of physical activity.;Long Term: Exercising regularly at least 3-5 days a week.       Increase Strength and Stamina Yes       Intervention Provide advice, education, support and counseling about physical activity/exercise needs.;Develop an individualized exercise prescription for aerobic and resistive training based on initial evaluation findings, risk stratification, comorbidities and participant's personal goals.       Expected Outcomes Short Term: Increase workloads from initial exercise prescription for resistance, speed, and METs.;Short Term: Perform resistance training exercises routinely during rehab and add in resistance training at home;Long Term: Improve cardiorespiratory fitness, muscular endurance and strength as measured by increased METs and functional capacity (6MWT)       Able to understand and use rate of perceived exertion (RPE) scale Yes  Intervention Provide education and explanation on how to use RPE scale       Expected Outcomes Short Term: Able to use RPE daily in rehab to express subjective intensity level;Long Term:  Able to use RPE to guide intensity level when exercising independently       Knowledge and understanding of Target Heart Rate Range (THRR) Yes       Intervention Provide education and explanation of THRR including how the numbers were predicted and where they are located for reference       Expected Outcomes Long  Term: Able to use THRR to govern intensity when exercising independently;Short Term: Able to state/look up THRR;Short Term: Able to use daily as guideline for intensity in rehab       Able to check pulse independently Yes       Intervention Provide education and demonstration on how to check pulse in carotid and radial arteries.;Review the importance of being able to check your own pulse for safety during independent exercise       Expected Outcomes Short Term: Able to explain why pulse checking is important during independent exercise;Long Term: Able to check pulse independently and accurately       Understanding of Exercise Prescription Yes       Intervention Provide education, explanation, and written materials on patient's individual exercise prescription       Expected Outcomes Short Term: Able to explain program exercise prescription;Long Term: Able to explain home exercise prescription to exercise independently              Exercise Goals Re-Evaluation :    Discharge Exercise Prescription (Final Exercise Prescription Changes):   Nutrition:  Target Goals: Understanding of nutrition guidelines, daily intake of sodium 1500mg , cholesterol 200mg , calories 30% from fat and 7% or less from saturated fats, daily to have 5 or more servings of fruits and vegetables.  Biometrics:  Pre Biometrics - 11/14/20 1030      Pre Biometrics   Height 6' (1.829 m)    Weight 291 lb 14.2 oz (132.4 kg)    Waist Circumference 56.25 inches    Hip Circumference 49 inches    Waist to Hip Ratio 1.15 %    BMI (Calculated) 39.58    Triceps Skinfold 20 mm    % Body Fat 40.3 %    Grip Strength 30.7 kg    Flexibility 0 in   did not complete due to back pain   Single Leg Stand 3 seconds            Nutrition Therapy Plan and Nutrition Goals:   Nutrition Assessments:  Nutrition Assessments - 11/14/20 0836      MEDFICTS Scores   Pre Score 110          MEDIFICTS Score Key:  ?70 Need to make  dietary changes   40-70 Heart Healthy Diet  ? 40 Therapeutic Level Cholesterol Diet   Picture Your Plate Scores:  <68 Unhealthy dietary pattern with much room for improvement.  41-50 Dietary pattern unlikely to meet recommendations for good health and room for improvement.  51-60 More healthful dietary pattern, with some room for improvement.   >60 Healthy dietary pattern, although there may be some specific behaviors that could be improved.    Nutrition Goals Re-Evaluation:   Nutrition Goals Discharge (Final Nutrition Goals Re-Evaluation):   Psychosocial: Target Goals: Acknowledge presence or absence of significant depression and/or stress, maximize coping skills, provide positive support system. Participant is able to verbalize  types and ability to use techniques and skills needed for reducing stress and depression.  Initial Review & Psychosocial Screening:  Initial Psych Review & Screening - 11/14/20 0833      Initial Review   Current issues with None Identified      Family Dynamics   Good Support System? Yes    Comments His niece Olivia Mackie) supports him. He has a special needs son who she looks after when he comes to his medical appointments. She also voices her concern about his continuing smoking.      Barriers   Psychosocial barriers to participate in program The patient should benefit from training in stress management and relaxation.      Screening Interventions   Interventions Encouraged to exercise;To provide support and resources with identified psychosocial needs   He is interested in meeting a hypnotist to aid him in smoking cessation.   Expected Outcomes Short Term goal: Utilizing psychosocial counselor, staff and physician to assist with identification of specific Stressors or current issues interfering with healing process. Setting desired goal for each stressor or current issue identified.;Long Term Goal: Stressors or current issues are controlled or  eliminated.;Short Term goal: Identification and review with participant of any Quality of Life or Depression concerns found by scoring the questionnaire.;Long Term goal: The participant improves quality of Life and PHQ9 Scores as seen by post scores and/or verbalization of changes           Quality of Life Scores:  Quality of Life - 11/14/20 1031      Quality of Life   Select Quality of Life      Quality of Life Scores   Health/Function Pre 15.4 %    Socioeconomic Pre 20.92 %    Psych/Spiritual Pre 9.43 %    Family Pre 14.8 %    GLOBAL Pre 15.05 %          Scores of 19 and below usually indicate a poorer quality of life in these areas.  A difference of  2-3 points is a clinically meaningful difference.  A difference of 2-3 points in the total score of the Quality of Life Index has been associated with significant improvement in overall quality of life, self-image, physical symptoms, and general health in studies assessing change in quality of life.  PHQ-9: Recent Review Flowsheet Data    Depression screen [facility] 2/9 11/14/2020 08/06/2020 08/02/2020 06/09/2017 02/24/2017   Decreased Interest 0 0 0 0 0   Down, Depressed, Hopeless 0 0 0 0 0   PHQ - 2 Score 0 0 0 0 0   Altered sleeping 2 - - - -   Tired, decreased energy 3 - - - -   Change in appetite 0 - - - -   Feeling bad or failure about yourself  2 - - - -   Trouble concentrating 0 - - - -   Moving slowly or fidgety/restless 0 - - - -   Suicidal thoughts 0 - - - -   PHQ-9 Score 7 - - - -   Difficult doing work/chores Somewhat difficult - - - -     Interpretation of Total Score  Total Score Depression Severity:  1-4 = Minimal depression, 5-9 = Mild depression, 10-14 = Moderate depression, 15-19 = Moderately severe depression, 20-27 = Severe depression   Psychosocial Evaluation and Intervention:  Psychosocial Evaluation - 11/14/20 1224      Psychosocial Evaluation & Interventions   Interventions Stress management  education;Relaxation education;Encouraged to  exercise with the program and follow exercise prescription    Comments Patient is currently smoking 2 packs of cigarettes per day. He reports he smokes at home because he is bored, which stems from his lack of ability to perform his old hobbies due to his cardiac and pulmonary diseases. He states that he would like to quit smoking and believes being hypnotized will help. I will look into this to see if these services are provided anywhere locally. He otherwise has no identifiable psychosocial issues.    Expected Outcomes Patient will work on smoking cessation and will cut back or completely quit smoking by the time he finishes the cardiac rehab program.    Continue Psychosocial Services  Follow up required by staff           Psychosocial Re-Evaluation:   Psychosocial Discharge (Final Psychosocial Re-Evaluation):   Vocational Rehabilitation: Provide vocational rehab assistance to qualifying candidates.   Vocational Rehab Evaluation & Intervention:  Vocational Rehab - 11/14/20 0845      Initial Vocational Rehab Evaluation & Intervention   Assessment shows need for Vocational Rehabilitation No      Vocational Rehab Re-Evaulation   Comments He is retired and does not want to go back to work.           Education: Education Goals: Education classes will be provided on a weekly basis, covering required topics. Participant will state understanding/return demonstration of topics presented.  Learning Barriers/Preferences:  Learning Barriers/Preferences - 11/14/20 0840      Learning Barriers/Preferences   Learning Barriers None    Learning Preferences Skilled Demonstration           Education Topics: Hypertension, Hypertension Reduction -Define heart disease and high blood pressure. Discus how high blood pressure affects the body and ways to reduce high blood pressure.   Exercise and Your Heart -Discuss why it is important to  exercise, the FITT principles of exercise, normal and abnormal responses to exercise, and how to exercise safely.   Angina -Discuss definition of angina, causes of angina, treatment of angina, and how to decrease risk of having angina.   Cardiac Medications -Review what the following cardiac medications are used for, how they affect the body, and side effects that may occur when taking the medications.  Medications include Aspirin, Beta blockers, calcium channel blockers, ACE Inhibitors, angiotensin receptor blockers, diuretics, digoxin, and antihyperlipidemics.   Congestive Heart Failure -Discuss the definition of CHF, how to live with CHF, the signs and symptoms of CHF, and how keep track of weight and sodium intake.   Heart Disease and Intimacy -Discus the effect sexual activity has on the heart, how changes occur during intimacy as we age, and safety during sexual activity.   Smoking Cessation / COPD -Discuss different methods to quit smoking, the health benefits of quitting smoking, and the definition of COPD.   Nutrition I: Fats -Discuss the types of cholesterol, what cholesterol does to the heart, and how cholesterol levels can be controlled.   Nutrition II: Labels -Discuss the different components of food labels and how to read food label   Heart Parts/Heart Disease and PAD -Discuss the anatomy of the heart, the pathway of blood circulation through the heart, and these are affected by heart disease.   Stress I: Signs and Symptoms -Discuss the causes of stress, how stress may lead to anxiety and depression, and ways to limit stress.   Stress II: Relaxation -Discuss different types of relaxation techniques to limit stress.   Warning  Signs of Stroke / TIA -Discuss definition of a stroke, what the signs and symptoms are of a stroke, and how to identify when someone is having stroke.   Knowledge Questionnaire Score:  Knowledge Questionnaire Score - 11/14/20 0841       Knowledge Questionnaire Score   Pre Score 23/28           Core Components/Risk Factors/Patient Goals at Admission:  Personal Goals and Risk Factors at Admission - 11/14/20 0846      Core Components/Risk Factors/Patient Goals on Admission    Weight Management Yes;Weight Loss    Expected Outcomes Short Term: Continue to assess and modify interventions until short term weight is achieved;Long Term: Adherence to nutrition and physical activity/exercise program aimed toward attainment of established weight goal;Weight Loss: Understanding of general recommendations for a balanced deficit meal plan, which promotes 1-2 lb weight loss per week and includes a negative energy balance of [phone] kcal/d;Understanding recommendations for meals to include 15-35% energy as protein, 25-35% energy from fat, 35-60% energy from carbohydrates, less than 200mg  of dietary cholesterol, 20-35 gm of total fiber daily;Understanding of distribution of calorie intake throughout the day with the consumption of 4-5 meals/snacks    Tobacco Cessation Yes    Number of packs per day 2 packs per day    Intervention Assist the participant in steps to quit. Provide individualized education and counseling about committing to Tobacco Cessation, relapse prevention, and pharmacological support that can be provided by physician.;Advice worker, assist with locating and accessing local/national Quit Smoking programs, and support quit date choice.    Expected Outcomes Short Term: Will demonstrate readiness to quit, by selecting a quit date.;Short Term: Will quit all tobacco product use, adhering to prevention of relapse plan.;Long Term: Complete abstinence from all tobacco products for at least 12 months from quit date.    Improve shortness of breath with ADL's Yes    Intervention Provide education, individualized exercise plan and daily activity instruction to help decrease symptoms of SOB with activities of daily living.     Expected Outcomes Short Term: Improve cardiorespiratory fitness to achieve a reduction of symptoms when performing ADLs;Long Term: Be able to perform more ADLs without symptoms or delay the onset of symptoms    Diabetes Yes    Intervention Provide education about signs/symptoms and action to take for hypo/hyperglycemia.;Provide education about proper nutrition, including hydration, and aerobic/resistive exercise prescription along with prescribed medications to achieve blood glucose in normal ranges: Fasting glucose 65-99 mg/dL    Expected Outcomes Short Term: Participant verbalizes understanding of the signs/symptoms and immediate care of hyper/hypoglycemia, proper foot care and importance of medication, aerobic/resistive exercise and nutrition plan for blood glucose control.;Long Term: Attainment of HbA1C < 7%.    Hypertension Yes    Intervention Provide education on lifestyle modifcations including regular physical activity/exercise, weight management, moderate sodium restriction and increased consumption of fresh fruit, vegetables, and low fat dairy, alcohol moderation, and smoking cessation.;Monitor prescription use compliance.    Expected Outcomes Short Term: Continued assessment and intervention until BP is < 140/30mm HG in hypertensive participants. < 130/11mm HG in hypertensive participants with diabetes, heart failure or chronic kidney disease.;Long Term: Maintenance of blood pressure at goal levels.           Core Components/Risk Factors/Patient Goals Review:    Core Components/Risk Factors/Patient Goals at Discharge (Final Review):    ITP Comments:  ITP Comments    Row Name 11/14/20 1234  ITP Comments Patient's blood glucose was 59 when he arrived for cardiac rehab orientation. He reported that he did not eat dinner the previous night nor had he ate breakfast. He was given orange juice and a pack of cheese crackers. When he was  rechecked, his blood glucose was 106.  Since he is prescribed insulin, his CBG must be at least 110 to exercise. He was provided with another pack of crackers and when he was rechecked his CBG was 123. I explained to him that he will need to eat breakfast in the mornings before he comes to class. He voiced his understanding.              Comments: Patient arrived for 1st visit/orientation/education at 0800. Patient was referred to CR by Dr. Harl Bowie due to NSTEMI (I21.4) and S/P coronary artery stent placement (Z95.5). During orientation advised patient on arrival and appointment times what to wear, what to do before, during and after exercise. Reviewed attendance and class policy.  Pt is scheduled to return Cardiac Rehab on 11/18/2020 at 0930. Pt was advised to come to class 15 minutes before class starts.  Discussed RPE/Dpysnea scales. Patient participated in warm up stretches. Patient was able to complete 6 minute walk test.  Telemetry: NSR. Patient was measured for the equipment. Discussed equipment safety with patient. Took patient pre-anthropometric measurements. Patient finished visit at 1015.

## 2020-11-14 NOTE — Progress Notes (Signed)
Cardiac/Pulmonary Rehab Medication Review by a Pharmacist  Does the patient  feel that his/her medications are working for him/her?  yes  Has the patient been experiencing any side effects to the medications prescribed?  no  Does the patient measure his/her own blood pressure or blood glucose at home?  yes   Does the patient have any problems obtaining medications due to transportation or finances?   no  Understanding of regimen: good Understanding of indications: good Potential of compliance: good  Questions asked to Determine Patient Understanding of Medication Regimen:  1. What is the name of the medication?  2. What is the medication used for?  3. When should it be taken?  4. How much should be taken?  5. How will you take it?  6. What side effects should you report?  Understanding Defined as: Excellent: All questions above are correct Good: Questions 1-4 are correct Fair: Questions 1-2 are correct  Poor: 1 or none of the above questions are correct   Pharmacist comments: Overall, patient has good understanding of medications and high compliance.  Patient states glucose, BP, and cholesterol levels are at desired levels.    Ramond Craver 11/14/2020 9:16 AM

## 2020-11-18 ENCOUNTER — Encounter (HOSPITAL_COMMUNITY): Payer: Medicare Other

## 2020-11-20 ENCOUNTER — Other Ambulatory Visit: Payer: Self-pay

## 2020-11-20 ENCOUNTER — Ambulatory Visit: Payer: Medicare Other | Admitting: Gastroenterology

## 2020-11-20 ENCOUNTER — Encounter (HOSPITAL_COMMUNITY)
Admission: RE | Admit: 2020-11-20 | Discharge: 2020-11-20 | Disposition: A | Discharge status: Home / Self Care | Payer: Medicare Other | Source: Ambulatory Visit | Attending: Cardiology | Admitting: Cardiology

## 2020-11-20 DIAGNOSIS — I214 Non-ST elevation (NSTEMI) myocardial infarction: Secondary | ICD-10-CM

## 2020-11-20 DIAGNOSIS — Z955 Presence of coronary angioplasty implant and graft: Secondary | ICD-10-CM

## 2020-11-20 LAB — GLUCOSE, CAPILLARY
Glucose-Capillary: 94 mg/dL (ref 70–99)
Glucose-Capillary: 89 mg/dL (ref 70–99)

## 2020-11-20 NOTE — Progress Notes (Signed)
Incomplete Session Note  Patient Details  Name: Gregory Alvarado MRN: [id number] Date of Birth: 27-Aug-1951 Referring Provider:   Flowsheet Row CARDIAC REHAB PHASE II ORIENTATION from 11/14/2020 in [place]  Referring Provider Dr. Colvin Caroli [name] did not complete his rehab session.  His blood glucose levels were too low to exercise today.

## 2020-11-20 NOTE — Progress Notes (Signed)
Cardiac Individual Treatment Plan  Patient Details  Name: Gregory Alvarado MRN: [id number] Date of Birth: [birth year] Referring Provider:   Flowsheet Row CARDIAC REHAB PHASE II ORIENTATION from 11/14/2020 in [facility]  Referring Provider Dr. Harl Bowie      Initial Encounter Date:  Flowsheet Row CARDIAC REHAB PHASE II ORIENTATION from 11/14/2020 in [facility]  Date 11/14/20      Visit Diagnosis: NSTEMI (non-ST elevated myocardial infarction) [place])  S/P coronary artery stent placement  Patient's Home Medications on Admission:  Current Outpatient Medications:  .  acetaminophen (TYLENOL) 325 MG tablet, Take 2 tablets (650 mg total) by mouth every 6 (six) hours as needed for mild pain (or Fever >/= 101)., Disp: 12 tablet, Rfl: 0 .  aspirin EC 81 MG tablet, Take 81 mg by mouth daily. Swallow whole., Disp: , Rfl:  .  atorvastatin (LIPITOR) 40 MG tablet, Take 40 mg by mouth at bedtime., Disp: , Rfl:  .  cyanocobalamin (,VITAMIN B-12,) 1000 MCG/ML injection, Inject 1,000 mcg into the muscle every 30 (thirty) days. , Disp: , Rfl:  .  docusate sodium (COLACE) 100 MG capsule, Take 1 capsule (100 mg total) by mouth daily., Disp: 60 capsule, Rfl: 1 .  folic acid (FOLVITE) 1 MG tablet, Take 1 tablet (1 mg total) by mouth daily. (Patient taking differently: Take 1 mg by mouth at bedtime.), Disp: 30 tablet, Rfl: 2 .  furosemide (LASIX) 40 MG tablet, Take 1 tablet (40 mg total) by mouth daily as needed., Disp: 90 tablet, Rfl: 1 .  gabapentin (NEURONTIN) 100 MG capsule, Take 300 mg by mouth 2 (two) times daily., Disp: , Rfl:  .  hydrochlorothiazide (HYDRODIURIL) 12.5 MG tablet, Take 12.5 mg by mouth at bedtime. , Disp: , Rfl:  .  insulin NPH-regular Human (NOVOLIN 70/30) (70-30) 100 UNIT/ML injection, Inject 40 Units into the skin in the morning and at bedtime. Inject 40 units in morning and 40 units every evening, Disp: , Rfl:  .  Insulin Pen Needle (B-D ULTRAFINE  III SHORT PEN) 31G X 8 MM MISC, 1 each by Does not apply route as directed., Disp: 100 each, Rfl: 3 .  INSULIN SYRINGE .5CC/29G 29G X 1/2" 0.5 ML MISC, Use to inject insulin 2 times a day, Disp: 100 each, Rfl: 3 .  isosorbide mononitrate (IMDUR) 30 MG 24 hr tablet, Take 1 tablet (30 mg total) by mouth daily. May take 2 if needed, Disp: 180 tablet, Rfl: 3 .  lisinopril (PRINIVIL,ZESTRIL) 20 MG tablet, Take 1 tablet (20 mg total) by mouth daily., Disp: 30 tablet, Rfl: 6 .  metoprolol succinate (TOPROL XL) 100 MG 24 hr tablet, Take 1 tablet (100 mg total) by mouth daily. (Patient taking differently: Take 50 mg by mouth daily.), Disp: 90 tablet, Rfl: 3 .  naproxen (NAPROSYN) 500 MG tablet, Take 500 mg by mouth 2 (two) times daily., Disp: , Rfl:  .  nitroGLYCERIN (NITROSTAT) 0.4 MG SL tablet, DISSOLVE 1 TABLET SUBLINGUALLY AS NEEDED FOR CHEST PAIN, MAY REPEAT EVERY 5 MINUTES. AFTER 3 CALL 911., Disp: 25 tablet, Rfl: 3 .  ONE TOUCH ULTRA TEST test strip, TEST FOUR TIMES DAILY AS DIRECTED., Disp: 150 each, Rfl: 5 .  pantoprazole (PROTONIX) 40 MG tablet, Take 1 tablet (40 mg total) by mouth 2 (two) times daily before a meal., Disp: 60 tablet, Rfl: 5 .  polyethylene glycol (MIRALAX / GLYCOLAX) 17 g packet, Take 17 g by mouth daily., Disp: 28 each, Rfl: 1 .  prasugrel (EFFIENT) 10 MG TABS tablet, Take 10 mg by mouth daily., Disp: , Rfl:  .  SPIRIVA HANDIHALER 18 MCG inhalation capsule, Place 1 capsule into inhaler and inhale daily., Disp: , Rfl:  .  spironolactone (ALDACTONE) 25 MG tablet, TAKE 1 TABLET BY MOUTH ONCE A DAY., Disp: 30 tablet, Rfl: 11 .  SUMAtriptan (IMITREX) 25 MG tablet, Take by mouth., Disp: , Rfl:  .  SYMBICORT 160-4.5 MCG/ACT inhaler, Inhale 2 puffs into the lungs 2 (two) times daily., Disp: , Rfl:  .  traMADol (ULTRAM) 50 MG tablet, Take 50-100 mg by mouth in the morning and at bedtime., Disp: , Rfl:  .  zolpidem (AMBIEN) 5 MG tablet, Take 5 mg by mouth at bedtime as needed., Disp: , Rfl:    Past Medical History: Past Medical History:  Diagnosis Date  . Anemia   . Arthritis    left shoulder  . COPD (chronic obstructive pulmonary disease) ([place])   . Coronary artery disease 09/11/2020   NSTEMI and 3 stents in [facility]  . Diabetes mellitus without complication ([place])   . Dyslipidemia   . Hypertension   . Insomnia   . Insomnia   . Morbid obesity ([place])   . Tobacco use     Tobacco Use: Social History   Tobacco Use  Smoking Status Current Every Day Smoker  . Packs/day: 2.00  . Types: Cigarettes  Smokeless Tobacco Never Used    Labs: Recent Review Flowsheet Data    Labs for ITP Cardiac and Pulmonary Rehab Latest Ref Rng & Units 11/03/2016 02/18/2017 10/26/2019 07/22/2020   Cholestrol 100 - 199 mg/dL - 186 - -   LDLCALC 0 - 99 mg/dL - 92 - -   HDL >39 mg/dL - 28(L) - -   Trlycerides 0 - 149 mg/dL - 329(H) - -   Hemoglobin A1c 4.8 - 5.6 % 13.2 11.3(H) 6.2(H) 5.7(H)      Capillary Blood Glucose: Lab Results  Component Value Date   GLUCAP 123 (H) 11/14/2020   GLUCAP 106 (H) 11/14/2020   GLUCAP 135 (H) 07/30/2020   GLUCAP 132 (H) 07/30/2020   GLUCAP 194 (H) 07/29/2020    POCT Glucose    Row Name 11/14/20 1231             POCT Blood Glucose   Pre-Exercise 106 mg/dL  His blood glucose was 59 when he arrived. He was given orange juice and cheese crackers. He was rechecked about 30 minutes later and was 106. He needed to be at least 110 to exercise. He ate another pack of crackers and was able to increase to 123.              Exercise Target Goals: Exercise Program Goal: Individual exercise prescription set using results from initial 6 min walk test and THRR while considering  patient's activity barriers and safety.   Exercise Prescription Goal: Starting with aerobic activity 30 plus minutes a day, 3 days per week for initial exercise prescription. Provide home exercise prescription and guidelines that participant acknowledges understanding prior to  discharge.  Activity Barriers & Risk Stratification:  Activity Barriers & Cardiac Risk Stratification - 11/14/20 0831      Activity Barriers & Cardiac Risk Stratification   Activity Barriers Back Problems;Joint Problems;Deconditioning;Shortness of Breath;Balance Concerns;History of Falls    Cardiac Risk Stratification High           6 Minute Walk:  6 Minute Walk    Row Name 11/14/20 1026  6 Minute Walk   Phase Initial     Distance 500 feet     Walk Time 6 minutes     # of Rest Breaks 1  took a 3 min break     MPH 0.9     METS 0.81     RPE 9     VO2 Peak 2.84     Symptoms Yes (comment)     Comments pain in quads 7/10     Resting HR 76 bpm     Resting BP 108/52     Resting Oxygen Saturation  98 %     Exercise Oxygen Saturation  during 6 min walk 98 %     Max Ex. HR 93 bpm     Max Ex. BP 122/48     2 Minute Post BP 102/48            Oxygen Initial Assessment:   Oxygen Re-Evaluation:   Oxygen Discharge (Final Oxygen Re-Evaluation):   Initial Exercise Prescription:  Initial Exercise Prescription - 11/14/20 1000      Date of Initial Exercise RX and Referring Provider   Date 11/14/20    Referring Provider Dr. Harl Bowie    Expected Discharge Date 02/05/21      NuStep   Level 1    SPM 60    Minutes 39      Prescription Details   Frequency (times per week) 3    Duration Progress to 30 minutes of continuous aerobic without signs/symptoms of physical distress      Intensity   THRR 40-80% of Max Heartrate 61-122    Ratings of Perceived Exertion 11-13      Progression   Progression Continue progressive overload as per policy without signs/symptoms or physical distress.      Resistance Training   Training Prescription Yes    Weight 3 lbs    Reps 10-15           Perform Capillary Blood Glucose checks as needed.  Exercise Prescription Changes:   Exercise Comments:   Exercise Goals and Review:   Exercise Goals    Row Name 11/14/20 1029  11/18/20 1142           Exercise Goals   Increase Physical Activity Yes Yes      Intervention Provide advice, education, support and counseling about physical activity/exercise needs.;Develop an individualized exercise prescription for aerobic and resistive training based on initial evaluation findings, risk stratification, comorbidities and participant's personal goals. Provide advice, education, support and counseling about physical activity/exercise needs.;Develop an individualized exercise prescription for aerobic and resistive training based on initial evaluation findings, risk stratification, comorbidities and participant's personal goals.      Expected Outcomes Short Term: Attend rehab on a regular basis to increase amount of physical activity.;Long Term: Add in home exercise to make exercise part of routine and to increase amount of physical activity.;Long Term: Exercising regularly at least 3-5 days a week. Short Term: Attend rehab on a regular basis to increase amount of physical activity.;Long Term: Add in home exercise to make exercise part of routine and to increase amount of physical activity.;Long Term: Exercising regularly at least 3-5 days a week.      Increase Strength and Stamina Yes Yes      Intervention Provide advice, education, support and counseling about physical activity/exercise needs.;Develop an individualized exercise prescription for aerobic and resistive training based on initial evaluation findings, risk stratification, comorbidities and participant's personal goals. Provide advice, education, support and  counseling about physical activity/exercise needs.;Develop an individualized exercise prescription for aerobic and resistive training based on initial evaluation findings, risk stratification, comorbidities and participant's personal goals.      Expected Outcomes Short Term: Increase workloads from initial exercise prescription for resistance, speed, and METs.;Short Term:  Perform resistance training exercises routinely during rehab and add in resistance training at home;Long Term: Improve cardiorespiratory fitness, muscular endurance and strength as measured by increased METs and functional capacity (6MWT) Short Term: Increase workloads from initial exercise prescription for resistance, speed, and METs.;Short Term: Perform resistance training exercises routinely during rehab and add in resistance training at home;Long Term: Improve cardiorespiratory fitness, muscular endurance and strength as measured by increased METs and functional capacity (6MWT)      Able to understand and use rate of perceived exertion (RPE) scale Yes Yes      Intervention Provide education and explanation on how to use RPE scale Provide education and explanation on how to use RPE scale      Expected Outcomes Short Term: Able to use RPE daily in rehab to express subjective intensity level;Long Term:  Able to use RPE to guide intensity level when exercising independently Short Term: Able to use RPE daily in rehab to express subjective intensity level;Long Term:  Able to use RPE to guide intensity level when exercising independently      Knowledge and understanding of Target Heart Rate Range (THRR) Yes Yes      Intervention Provide education and explanation of THRR including how the numbers were predicted and where they are located for reference Provide education and explanation of THRR including how the numbers were predicted and where they are located for reference      Expected Outcomes Long Term: Able to use THRR to govern intensity when exercising independently;Short Term: Able to state/look up THRR;Short Term: Able to use daily as guideline for intensity in rehab Long Term: Able to use THRR to govern intensity when exercising independently;Short Term: Able to state/look up THRR;Short Term: Able to use daily as guideline for intensity in rehab      Able to check pulse independently Yes Yes       Intervention Provide education and demonstration on how to check pulse in carotid and radial arteries.;Review the importance of being able to check your own pulse for safety during independent exercise Provide education and demonstration on how to check pulse in carotid and radial arteries.;Review the importance of being able to check your own pulse for safety during independent exercise      Expected Outcomes Short Term: Able to explain why pulse checking is important during independent exercise;Long Term: Able to check pulse independently and accurately Short Term: Able to explain why pulse checking is important during independent exercise;Long Term: Able to check pulse independently and accurately      Understanding of Exercise Prescription Yes Yes      Intervention Provide education, explanation, and written materials on patient's individual exercise prescription Provide education, explanation, and written materials on patient's individual exercise prescription      Expected Outcomes Short Term: Able to explain program exercise prescription;Long Term: Able to explain home exercise prescription to exercise independently Short Term: Able to explain program exercise prescription;Long Term: Able to explain home exercise prescription to exercise independently             Exercise Goals Re-Evaluation :  Exercise Goals Re-Evaluation    [place] Name 11/18/20 1142  Exercise Goal Re-Evaluation   Exercise Goals Review Increase Physical Activity;Increase Strength and Stamina;Able to understand and use rate of perceived exertion (RPE) scale;Knowledge and understanding of Target Heart Rate Range (THRR);Able to check pulse independently;Understanding of Exercise Prescription       Comments Pt was set to begin exercise on 11/18/2020, but patient arrived at the wrong time and became argumentative with staff. Pt states he will return 11/20/2020.       Expected Outcomes Through exercise in the cardiac rehab  program and by engaging in a home exercise program, the patient will get stronger and reach their goals.               Discharge Exercise Prescription (Final Exercise Prescription Changes):   Nutrition:  Target Goals: Understanding of nutrition guidelines, daily intake of sodium 1500mg , cholesterol 200mg , calories 30% from fat and 7% or less from saturated fats, daily to have 5 or more servings of fruits and vegetables.  Biometrics:  Pre Biometrics - 11/14/20 1030      Pre Biometrics   Height 6' (1.829 m)    Weight 132.4 kg    Waist Circumference 56.25 inches    Hip Circumference 49 inches    Waist to Hip Ratio 1.15 %    BMI (Calculated) 39.58    Triceps Skinfold 20 mm    % Body Fat 40.3 %    Grip Strength 30.7 kg    Flexibility 0 in   did not complete due to back pain   Single Leg Stand 3 seconds            Nutrition Therapy Plan and Nutrition Goals:  Nutrition Therapy & Goals - 11/18/20 0855      Personal Nutrition Goals   Comments Patient scored 110 on his diet assessment. We have 2 educational sessions on heart healthy nutrition with handouts. We also offer our RD services here at [facility]. Patient declined.      Intervention Plan   Intervention Nutrition handout(s) given to patient.           Nutrition Assessments:  Nutrition Assessments - 11/14/20 0836      MEDFICTS Scores   Pre Score 110          MEDIFICTS Score Key:  ?70 Need to make dietary changes   40-70 Heart Healthy Diet  ? 40 Therapeutic Level Cholesterol Diet   Picture Your Plate Scores:  <16 Unhealthy dietary pattern with much room for improvement.  41-50 Dietary pattern unlikely to meet recommendations for good health and room for improvement.  51-60 More healthful dietary pattern, with some room for improvement.   >60 Healthy dietary pattern, although there may be some specific behaviors that could be improved.    Nutrition Goals Re-Evaluation:   Nutrition Goals Discharge  (Final Nutrition Goals Re-Evaluation):   Psychosocial: Target Goals: Acknowledge presence or absence of significant depression and/or stress, maximize coping skills, provide positive support system. Participant is able to verbalize types and ability to use techniques and skills needed for reducing stress and depression.  Initial Review & Psychosocial Screening:  Initial Psych Review & Screening - 11/14/20 0833      Initial Review   Current issues with None Identified      Family Dynamics   Good Support System? Yes    Comments His niece Olivia Mackie) supports him. He has a special needs son who she looks after when he comes to his medical appointments. She also voices her concern about his continuing smoking.  Barriers   Psychosocial barriers to participate in program The patient should benefit from training in stress management and relaxation.      Screening Interventions   Interventions Encouraged to exercise;To provide support and resources with identified psychosocial needs   He is interested in meeting a hypnotist to aid him in smoking cessation.   Expected Outcomes Short Term goal: Utilizing psychosocial counselor, staff and physician to assist with identification of specific Stressors or current issues interfering with healing process. Setting desired goal for each stressor or current issue identified.;Long Term Goal: Stressors or current issues are controlled or eliminated.;Short Term goal: Identification and review with participant of any Quality of Life or Depression concerns found by scoring the questionnaire.;Long Term goal: The participant improves quality of Life and PHQ9 Scores as seen by post scores and/or verbalization of changes           Quality of Life Scores:  Quality of Life - 11/14/20 1031      Quality of Life   Select Quality of Life      Quality of Life Scores   Health/Function Pre 15.4 %    Socioeconomic Pre 20.92 %    Psych/Spiritual Pre 9.43 %    Family  Pre 14.8 %    GLOBAL Pre 15.05 %          Scores of 19 and below usually indicate a poorer quality of life in these areas.  A difference of  2-3 points is a clinically meaningful difference.  A difference of 2-3 points in the total score of the Quality of Life Index has been associated with significant improvement in overall quality of life, self-image, physical symptoms, and general health in studies assessing change in quality of life.  PHQ-9: Recent Review Flowsheet Data    Depression screen [facility] 2/9 11/14/2020 08/06/2020 08/02/2020 06/09/2017 02/24/2017   Decreased Interest 0 0 0 0 0   Down, Depressed, Hopeless 0 0 0 0 0   PHQ - 2 Score 0 0 0 0 0   Altered sleeping 2 - - - -   Tired, decreased energy 3 - - - -   Change in appetite 0 - - - -   Feeling bad or failure about yourself  2 - - - -   Trouble concentrating 0 - - - -   Moving slowly or fidgety/restless 0 - - - -   Suicidal thoughts 0 - - - -   PHQ-9 Score 7 - - - -   Difficult doing work/chores Somewhat difficult - - - -     Interpretation of Total Score  Total Score Depression Severity:  1-4 = Minimal depression, 5-9 = Mild depression, 10-14 = Moderate depression, 15-19 = Moderately severe depression, 20-27 = Severe depression   Psychosocial Evaluation and Intervention:  Psychosocial Evaluation - 11/14/20 1224      Psychosocial Evaluation & Interventions   Interventions Stress management education;Relaxation education;Encouraged to exercise with the program and follow exercise prescription    Comments Patient is currently smoking 2 packs of cigarettes per day. He reports he smokes at home because he is bored, which stems from his lack of ability to perform his old hobbies due to his cardiac and pulmonary diseases. He states that he would like to quit smoking and believes being hypnotized will help. I will look into this to see if these services are provided anywhere locally. He otherwise has no identifiable psychosocial  issues.    Expected Outcomes Patient will work on  smoking cessation and will cut back or completely quit smoking by the time he finishes the cardiac rehab program.    Continue Psychosocial Services  Follow up required by staff           Psychosocial Re-Evaluation:  Psychosocial Re-Evaluation    [place] Name 11/18/20 [phone]             Psychosocial Re-Evaluation   Current issues with None Identified       Comments Patient is new to the program starting today 11/18/20. He has no psychosocial issues identified. His initial QOL score was 15.7 and his PHQ-9 score was 7. He uses Ambien 5 mg for sleep.       Expected Outcomes Patient will have no psychosocial issues identified at discharge.       Interventions Encouraged to attend Cardiac Rehabilitation for the exercise;Stress management education;Relaxation education       Continue Psychosocial Services  No Follow up required              Psychosocial Discharge (Final Psychosocial Re-Evaluation):  Psychosocial Re-Evaluation - 11/18/20 0858      Psychosocial Re-Evaluation   Current issues with None Identified    Comments Patient is new to the program starting today 11/18/20. He has no psychosocial issues identified. His initial QOL score was 15.7 and his PHQ-9 score was 7. He uses Ambien 5 mg for sleep.    Expected Outcomes Patient will have no psychosocial issues identified at discharge.    Interventions Encouraged to attend Cardiac Rehabilitation for the exercise;Stress management education;Relaxation education    Continue Psychosocial Services  No Follow up required           Vocational Rehabilitation: Provide vocational rehab assistance to qualifying candidates.   Vocational Rehab Evaluation & Intervention:  Vocational Rehab - 11/14/20 0845      Initial Vocational Rehab Evaluation & Intervention   Assessment shows need for Vocational Rehabilitation No      Vocational Rehab Re-Evaulation   Comments He is retired and does not want  to go back to work.           Education: Education Goals: Education classes will be provided on a weekly basis, covering required topics. Participant will state understanding/return demonstration of topics presented.  Learning Barriers/Preferences:  Learning Barriers/Preferences - 11/14/20 0840      Learning Barriers/Preferences   Learning Barriers None    Learning Preferences Skilled Demonstration           Education Topics: Hypertension, Hypertension Reduction -Define heart disease and high blood pressure. Discus how high blood pressure affects the body and ways to reduce high blood pressure.   Exercise and Your Heart -Discuss why it is important to exercise, the FITT principles of exercise, normal and abnormal responses to exercise, and how to exercise safely.   Angina -Discuss definition of angina, causes of angina, treatment of angina, and how to decrease risk of having angina.   Cardiac Medications -Review what the following cardiac medications are used for, how they affect the body, and side effects that may occur when taking the medications.  Medications include Aspirin, Beta blockers, calcium channel blockers, ACE Inhibitors, angiotensin receptor blockers, diuretics, digoxin, and antihyperlipidemics.   Congestive Heart Failure -Discuss the definition of CHF, how to live with CHF, the signs and symptoms of CHF, and how keep track of weight and sodium intake.   Heart Disease and Intimacy -Discus the effect sexual activity has on the heart, how changes occur during intimacy  as we age, and safety during sexual activity.   Smoking Cessation / COPD -Discuss different methods to quit smoking, the health benefits of quitting smoking, and the definition of COPD.   Nutrition I: Fats -Discuss the types of cholesterol, what cholesterol does to the heart, and how cholesterol levels can be controlled.   Nutrition II: Labels -Discuss the different components of food  labels and how to read food label   Heart Parts/Heart Disease and PAD -Discuss the anatomy of the heart, the pathway of blood circulation through the heart, and these are affected by heart disease.   Stress I: Signs and Symptoms -Discuss the causes of stress, how stress may lead to anxiety and depression, and ways to limit stress.   Stress II: Relaxation -Discuss different types of relaxation techniques to limit stress.   Warning Signs of Stroke / TIA -Discuss definition of a stroke, what the signs and symptoms are of a stroke, and how to identify when someone is having stroke.   Knowledge Questionnaire Score:  Knowledge Questionnaire Score - 11/14/20 0841      Knowledge Questionnaire Score   Pre Score 23/28           Core Components/Risk Factors/Patient Goals at Admission:  Personal Goals and Risk Factors at Admission - 11/14/20 0846      Core Components/Risk Factors/Patient Goals on Admission    Weight Management Yes;Weight Loss    Expected Outcomes Short Term: Continue to assess and modify interventions until short term weight is achieved;Long Term: Adherence to nutrition and physical activity/exercise program aimed toward attainment of established weight goal;Weight Loss: Understanding of general recommendations for a balanced deficit meal plan, which promotes 1-2 lb weight loss per week and includes a negative energy balance of [id number] kcal/d;Understanding recommendations for meals to include 15-35% energy as protein, 25-35% energy from fat, 35-60% energy from carbohydrates, less than 200mg  of dietary cholesterol, 20-35 gm of total fiber daily;Understanding of distribution of calorie intake throughout the day with the consumption of 4-5 meals/snacks    Tobacco Cessation Yes    Number of packs per day 2 packs per day    Intervention Assist the participant in steps to quit. Provide individualized education and counseling about committing to Tobacco Cessation, relapse  prevention, and pharmacological support that can be provided by physician.;Advice worker, assist with locating and accessing local/national Quit Smoking programs, and support quit date choice.    Expected Outcomes Short Term: Will demonstrate readiness to quit, by selecting a quit date.;Short Term: Will quit all tobacco product use, adhering to prevention of relapse plan.;Long Term: Complete abstinence from all tobacco products for at least 12 months from quit date.    Improve shortness of breath with ADL's Yes    Intervention Provide education, individualized exercise plan and daily activity instruction to help decrease symptoms of SOB with activities of daily living.    Expected Outcomes Short Term: Improve cardiorespiratory fitness to achieve a reduction of symptoms when performing ADLs;Long Term: Be able to perform more ADLs without symptoms or delay the onset of symptoms    Diabetes Yes    Intervention Provide education about signs/symptoms and action to take for hypo/hyperglycemia.;Provide education about proper nutrition, including hydration, and aerobic/resistive exercise prescription along with prescribed medications to achieve blood glucose in normal ranges: Fasting glucose 65-99 mg/dL    Expected Outcomes Short Term: Participant verbalizes understanding of the signs/symptoms and immediate care of hyper/hypoglycemia, proper foot care and importance of medication, aerobic/resistive exercise and nutrition plan  for blood glucose control.;Long Term: Attainment of HbA1C < 7%.    Hypertension Yes    Intervention Provide education on lifestyle modifcations including regular physical activity/exercise, weight management, moderate sodium restriction and increased consumption of fresh fruit, vegetables, and low fat dairy, alcohol moderation, and smoking cessation.;Monitor prescription use compliance.    Expected Outcomes Short Term: Continued assessment and intervention until BP is <  140/75mm HG in hypertensive participants. < 130/2mm HG in hypertensive participants with diabetes, heart failure or chronic kidney disease.;Long Term: Maintenance of blood pressure at goal levels.           Core Components/Risk Factors/Patient Goals Review:   Goals and Risk Factor Review    Row Name 11/18/20 0856             Core Components/Risk Factors/Patient Goals Review   Personal Goals Review Weight Management/Obesity;Tobacco Cessation       Review Patient referred to cardiac rehab with NSTEMI and stent placement. He has multiple risk factors for CAD and is participating in the program for risk modification. His personal goals for the program are to quit smoking and get stronger. We will monitor his progress as he works toward meeting these goals.       Expected Outcomes Patient will complete the program meeting both personal and program goals.              Core Components/Risk Factors/Patient Goals at Discharge (Final Review):   Goals and Risk Factor Review - 11/18/20 0856      Core Components/Risk Factors/Patient Goals Review   Personal Goals Review Weight Management/Obesity;Tobacco Cessation    Review Patient referred to cardiac rehab with NSTEMI and stent placement. He has multiple risk factors for CAD and is participating in the program for risk modification. His personal goals for the program are to quit smoking and get stronger. We will monitor his progress as he works toward meeting these goals.    Expected Outcomes Patient will complete the program meeting both personal and program goals.           ITP Comments:  ITP Comments    Row Name 11/14/20 1234 11/18/20 1134         ITP Comments Patient's blood glucose was 59 when he arrived for cardiac rehab orientation. He reported that he did not eat dinner the previous night nor had he ate breakfast. He was given orange juice and a pack of cheese crackers. When he was  rechecked, his blood glucose was 106. Since he  is prescribed insulin, his CBG must be at least 110 to exercise. He was provided with another pack of crackers and when he was rechecked his CBG was 123. I explained to him that he will need to eat breakfast in the mornings before he comes to class. He voiced his understanding. Pt arrived for his first exercise session today at 0830. Staff explained to patient that his appointment was not until 0930, so he did not need to arrive until 0915. Pt was given paperwork during his orientation on 11/14/20 stating his arrival time to exercise is 0915. Pt became argumentative with staff stating that a staff member had told him to be here at 0830. I explained to patient that we do not have any class times at 0830, so a staff member would not have told him that. I apoligized for any misunderstanding that may have taken place. I asked patient if I had wrote the wrong time on his paperwork, and he admitted that  he had not looked at any of his paperwork. Pt continued to be argumentative with staff and stated that "we needed to get our act together" and "that he would be calling his doctor". At this point it was 0845. Instead of waiting in the lobby, the patient decided to leave. When leaving, he stated, "I better leave before I get angry". Pt's appointment for the day was cancelled.             Comments: ITP REVIEW Patient plans to start today 11/20/20.

## 2020-11-20 NOTE — Progress Notes (Signed)
Pt arrived for his exercise session today, 11/20/2020 at 0925. He reported that he did not eat breakfast and that he had not had any calories since the night before. Pt is on insulin, and per departmental guidelines his CBG must be at least 110 to exercise. I thoroughly discussed this with him at his orientation, as he also did not eat breakfast then, and his CBG was 59 upon arrival. When his CBG was taken today, it was 89. He was provided orange juice and rechecked about 15 minutes later and it was 94. He was not allowed to exercise. He reports that he usually does not eat breakfast until around 1100. We expressed concern with this, as his class time is 0930, and offered him to move to a later class that would accomodate his schedule. He decided that he would like to continue to try the 0930 class and will eat something on Friday before he comes.

## 2020-11-20 NOTE — Progress Notes (Deleted)
Daily Session Note  Patient Details  Name: Gregory Alvarado MRN: [id number] Date of Birth: 1950-09-18 Referring Provider:   Flowsheet Row CARDIAC REHAB PHASE II ORIENTATION from 11/14/2020 in [place]  Referring Provider Dr. Harl Bowie      Encounter Date: 11/20/2020  Check In:  Session Check In - 11/20/20 0930      Check-In   Supervising physician immediately available to respond to emergencies [facility] MD immediately available    Physician(s) Dr. Domenic Polite    Location [facility]    Staff Present Cathren Harsh, MS, Exercise Physiologist;Dalton Kris Mouton, MS, ACSM-CEP, Exercise Physiologist;Debra Wynetta Emery, RN, BSN    Virtual Visit No    Medication changes reported     No    Fall or balance concerns reported    No    Tobacco Cessation No Change    Warm-up and Cool-down Not performed (comment)    Resistance Training Performed No   blood sugar too low   VAD Patient? No    PAD/SET Patient? No      Pain Assessment   Currently in Pain? No/denies    Multiple Pain Sites No           Capillary Blood Glucose: No results found for this or any previous visit (from the past 24 hour(s)).    Social History   Tobacco Use  Smoking Status Current Every Day Smoker  . Packs/day: 2.00  . Types: Cigarettes  Smokeless Tobacco Never Used    Goals Met:  Independence with exercise equipment Exercise tolerated well No report of cardiac concerns or symptoms Strength training completed today  Goals Unmet:  Not Applicable  Comments: checkout 67   Dr. Kathie Dike is Medical Director for [facility] Pulmonary Re[facility].

## 2020-11-21 ENCOUNTER — Other Ambulatory Visit (HOSPITAL_COMMUNITY): Payer: Medicare Other

## 2020-11-22 ENCOUNTER — Other Ambulatory Visit: Payer: Self-pay

## 2020-11-22 ENCOUNTER — Encounter (HOSPITAL_COMMUNITY)
Admission: RE | Admit: 2020-11-22 | Discharge: 2020-11-22 | Disposition: A | Discharge status: Home / Self Care | Payer: Medicare Other | Source: Ambulatory Visit | Attending: Cardiology | Admitting: Cardiology

## 2020-11-22 DIAGNOSIS — Z955 Presence of coronary angioplasty implant and graft: Secondary | ICD-10-CM | POA: Diagnosis not present

## 2020-11-22 DIAGNOSIS — I214 Non-ST elevation (NSTEMI) myocardial infarction: Secondary | ICD-10-CM | POA: Diagnosis not present

## 2020-11-22 LAB — GLUCOSE, CAPILLARY
Glucose-Capillary: 106 mg/dL — ABNORMAL HIGH (ref 70–99)
Glucose-Capillary: 102 mg/dL — ABNORMAL HIGH (ref 70–99)
Glucose-Capillary: 115 mg/dL — ABNORMAL HIGH (ref 70–99)

## 2020-11-22 NOTE — Progress Notes (Signed)
Daily Session Note  Patient Details  Name: Gregory Alvarado MRN: [id number] Date of Birth: 1950-11-14 Referring Provider:   Flowsheet Row CARDIAC REHAB PHASE II ORIENTATION from 11/14/2020 in [place]  Referring Provider Dr. Harl Bowie      Encounter Date: 11/22/2020  Check In:  Session Check In - 11/22/20 0930      Check-In   Supervising physician immediately available to respond to emergencies [facility] MD immediately available    Physician(s) Dr. Domenic Polite    Location [facility]    Staff Present Hoy Register, MS, ACSM-CEP, Exercise Physiologist;Railee Bonillas Wynetta Emery, RN, BSN    Virtual Visit No    Medication changes reported     No    Fall or balance concerns reported    No    Tobacco Cessation No Change    Warm-up and Cool-down Performed as group-led instruction    Resistance Training Performed No    VAD Patient? No    PAD/SET Patient? No      Pain Assessment   Currently in Pain? No/denies    Multiple Pain Sites No           Capillary Blood Glucose: Results for orders placed or performed during the hospital encounter of 11/22/20 (from the past 24 hour(s))  Glucose, capillary     Status: Abnormal   Collection Time: 11/22/20  9:32 AM  Result Value Ref Range   Glucose-Capillary 102 (H) 70 - 99 mg/dL  Glucose, capillary     Status: Abnormal   Collection Time: 11/22/20  9:46 AM  Result Value Ref Range   Glucose-Capillary 115 (H) 70 - 99 mg/dL      Social History   Tobacco Use  Smoking Status Current Every Day Smoker  . Packs/day: 2.00  . Types: Cigarettes  Smokeless Tobacco Never Used    Goals Met:  Independence with exercise equipment Exercise tolerated well No report of cardiac concerns or symptoms Strength training completed today  Goals Unmet:  Not Applicable  Comments: Check out 1030.   Dr. Kathie Dike is Medical Director for [facility] Re[facility].

## 2020-11-25 ENCOUNTER — Encounter (HOSPITAL_COMMUNITY)
Admission: RE | Admit: 2020-11-25 | Discharge: 2020-11-25 | Disposition: A | Discharge status: Home / Self Care | Payer: Medicare Other | Source: Ambulatory Visit | Attending: Cardiology | Admitting: Cardiology

## 2020-11-25 ENCOUNTER — Other Ambulatory Visit: Payer: Self-pay

## 2020-11-25 DIAGNOSIS — I214 Non-ST elevation (NSTEMI) myocardial infarction: Secondary | ICD-10-CM | POA: Diagnosis not present

## 2020-11-25 DIAGNOSIS — Z955 Presence of coronary angioplasty implant and graft: Secondary | ICD-10-CM | POA: Diagnosis not present

## 2020-11-25 LAB — GLUCOSE, CAPILLARY: Glucose-Capillary: 109 mg/dL — ABNORMAL HIGH (ref 70–99)

## 2020-11-25 NOTE — Progress Notes (Signed)
Daily Session Note  Patient Details  Name: Gregory Alvarado MRN: [id number] Date of Birth: 01-26-1951 Referring Provider:   Flowsheet Row CARDIAC REHAB PHASE II ORIENTATION from 11/14/2020 in [place]  Referring Provider Dr. Harl Bowie      Encounter Date: 11/25/2020  Check In:  Session Check In - 11/25/20 0930      Check-In   Supervising physician immediately available to respond to emergencies [facility] MD immediately available    Physician(s) Dr. Harrington Challenger    Location [facility]    Staff Present Hoy Register, MS, ACSM-CEP, Exercise Physiologist;Debra Wynetta Emery, RN, BSN;Madison Audria Nine, MS, Exercise Physiologist    Virtual Visit No    Medication changes reported     No    Fall or balance concerns reported    No    Tobacco Cessation No Change    Warm-up and Cool-down Performed as group-led instruction    Resistance Training Performed Yes    VAD Patient? No    PAD/SET Patient? No      Pain Assessment   Currently in Pain? No/denies    Multiple Pain Sites No           Capillary Blood Glucose: Results for orders placed or performed during the hospital encounter of 11/25/20 (from the past 24 hour(s))  Glucose, capillary     Status: Abnormal   Collection Time: 11/25/20  9:23 AM  Result Value Ref Range   Glucose-Capillary 109 (H) 70 - 99 mg/dL      Social History   Tobacco Use  Smoking Status Current Every Day Smoker  . Packs/day: 2.00  . Types: Cigarettes  Smokeless Tobacco Never Used    Goals Met:  Independence with exercise equipment Exercise tolerated well No report of cardiac concerns or symptoms Strength training completed today  Goals Unmet:  Not Applicable  Comments: checkout time is 1030   Dr. Kathie Dike is Medical Director for [facility] Pulmonary Rehab.

## 2020-11-26 IMAGING — DX DG ABDOMEN ACUTE W/ 1V CHEST
4 series · 4 of 4 positions shown · non-contrast
Comparison: CT 07/23/2020

CLINICAL DATA: Abdominal pain

EXAM:
DG ABDOMEN ACUTE WITH 1 VIEW CHEST

[chest pa]
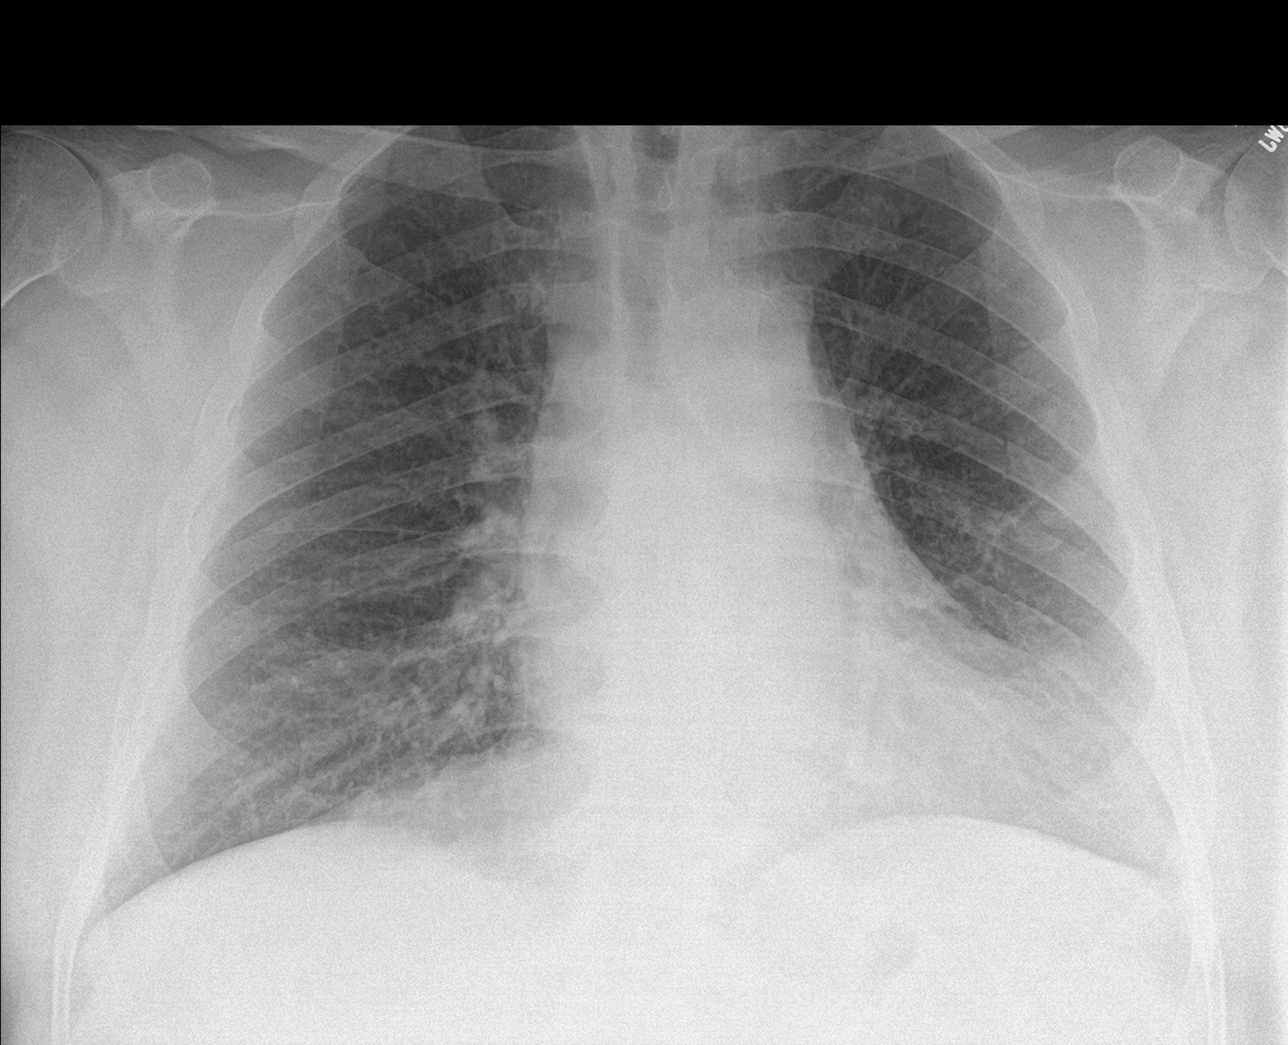

[abdomen erect]
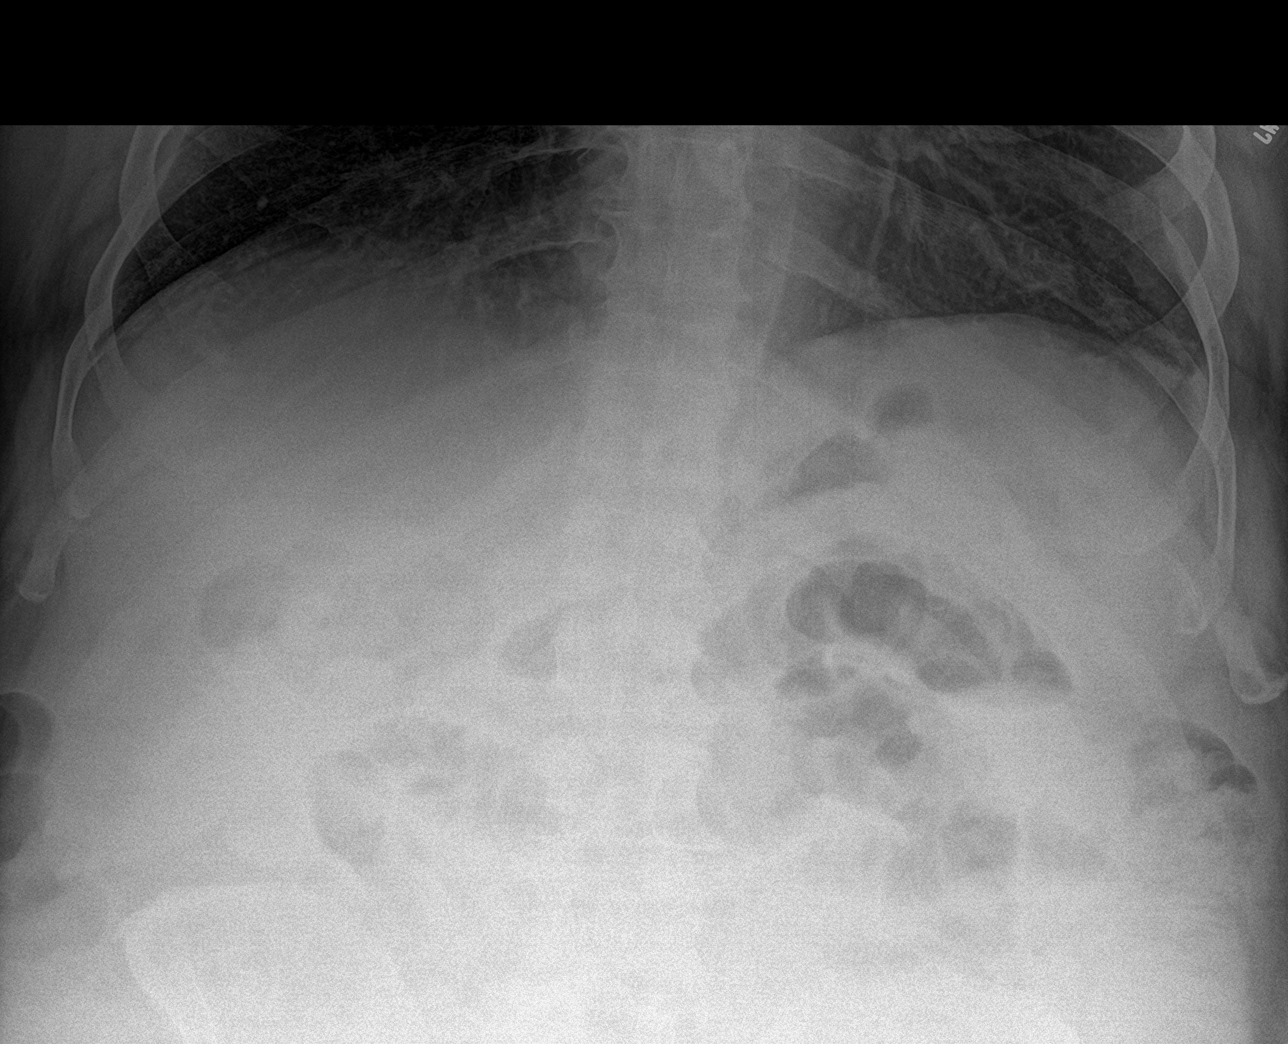

[abdomen supine (1 of 2)]
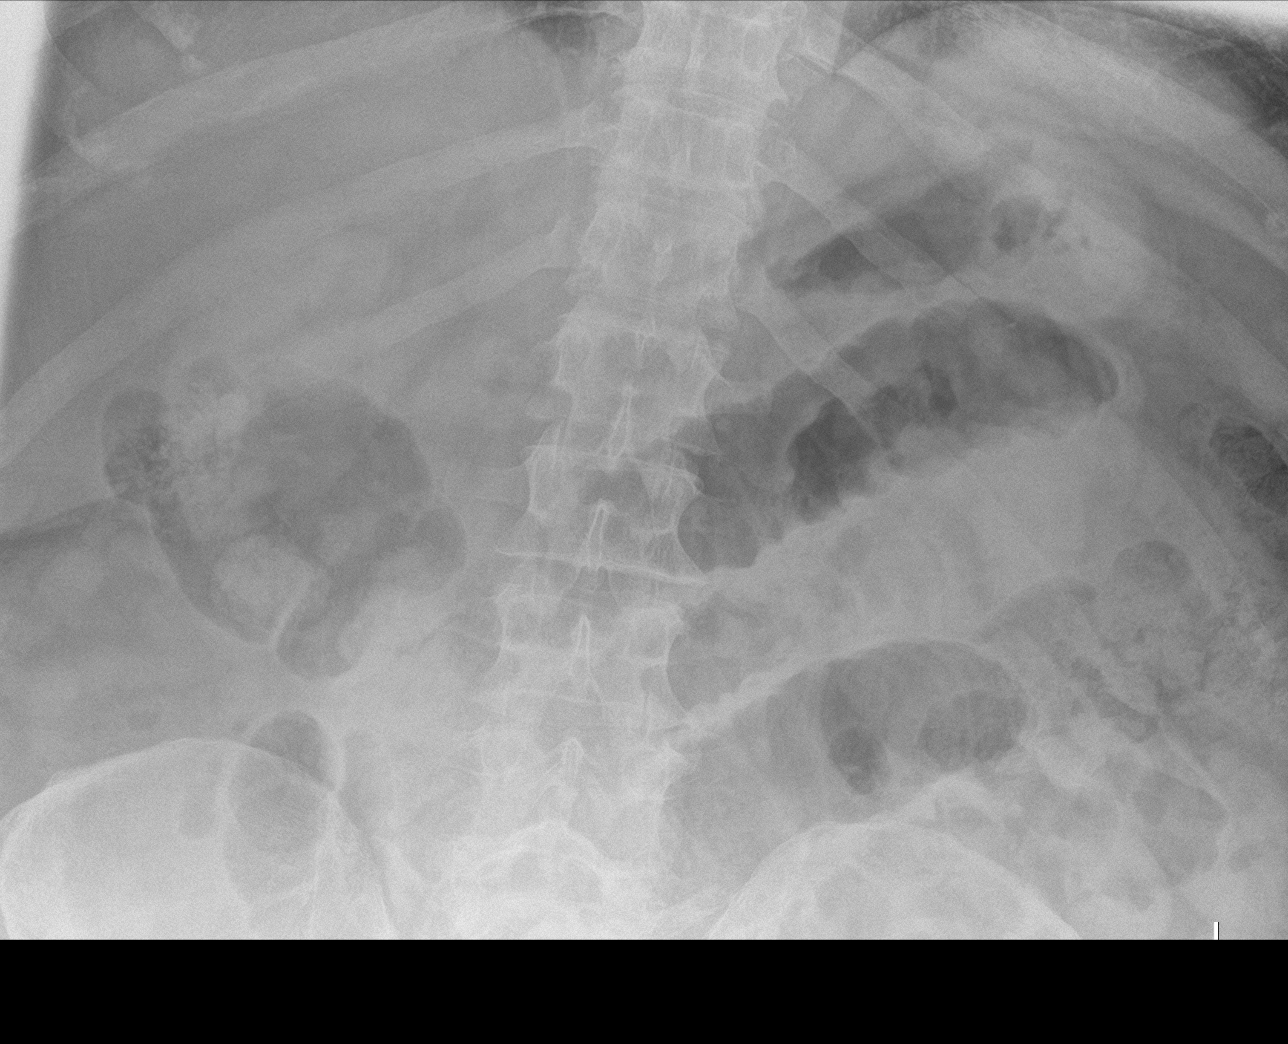

[abdomen supine (2 of 2)]
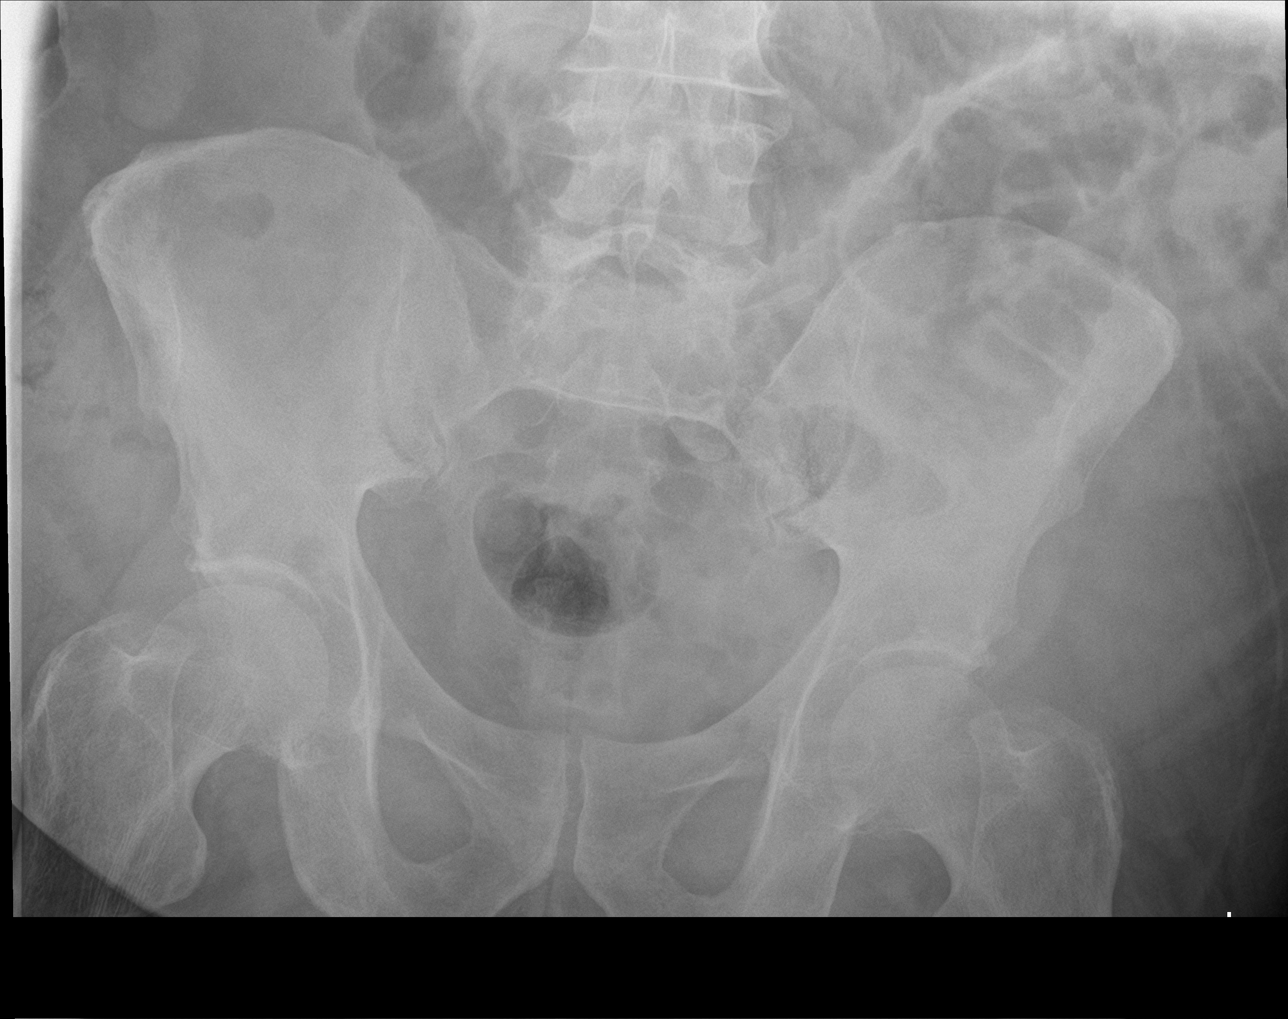

[4 of 4 positions shown; findings below may reference images not displayed]

FINDINGS: Minimal left basilar atelectasis. Lungs are otherwise clear. No
pneumothorax or pleural effusion. Cardiac size within normal limits.

Multiple gas-filled dilated loops of small bowel are seen within the
mid abdomen, similar to that noted on prior CT examination. There is
relatively little gas within the colon, decreased since prior CT
examination. Together, the findings are in keeping with a developing
mid to distal small bowel obstruction. No free intraperitoneal gas.
No abnormal calcifications within the abdomen. Osseous structures
are unremarkable.
IMPRESSION: Developing mid to distal small bowel obstruction.  No free air.

## 2020-11-27 ENCOUNTER — Encounter (HOSPITAL_COMMUNITY)
Admission: RE | Admit: 2020-11-27 | Discharge: 2020-11-27 | Disposition: A | Discharge status: Home / Self Care | Payer: Medicare Other | Source: Ambulatory Visit | Attending: Cardiology | Admitting: Cardiology

## 2020-11-27 ENCOUNTER — Other Ambulatory Visit: Payer: Self-pay

## 2020-11-27 DIAGNOSIS — Z955 Presence of coronary angioplasty implant and graft: Secondary | ICD-10-CM

## 2020-11-27 DIAGNOSIS — I214 Non-ST elevation (NSTEMI) myocardial infarction: Secondary | ICD-10-CM | POA: Diagnosis not present

## 2020-11-27 NOTE — Progress Notes (Signed)
Daily Session Note  Patient Details  Name: KAPIL PETROPOULOS MRN: [id number] Date of Birth: 1951/02/19 Referring Provider:   Flowsheet Row CARDIAC REHAB PHASE II ORIENTATION from 11/14/2020 in [place]  Referring Provider Dr. Harl Bowie      Encounter Date: 11/27/2020  Check In:  Session Check In - 11/27/20 0930      Check-In   Supervising physician immediately available to respond to emergencies [facility] MD immediately available    Physician(s) Dr. Harrington Challenger    Location [facility]    Staff Present Cathren Harsh, MS, Exercise Physiologist;Dalton Kris Mouton, MS, ACSM-CEP, Exercise Physiologist    Virtual Visit No    Medication changes reported     No    Fall or balance concerns reported    No    Tobacco Cessation No Change    Warm-up and Cool-down Performed as group-led instruction    Resistance Training Performed Yes    VAD Patient? No    PAD/SET Patient? No      Pain Assessment   Currently in Pain? No/denies    Multiple Pain Sites No           Capillary Blood Glucose: No results found for this or any previous visit (from the past 24 hour(s)).    Social History   Tobacco Use  Smoking Status Current Every Day Smoker  . Packs/day: 2.00  . Types: Cigarettes  Smokeless Tobacco Never Used    Goals Met:  Independence with exercise equipment Exercise tolerated well No report of cardiac concerns or symptoms Strength training completed today  Goals Unmet:  Not Applicable  Comments: check out 1030   Dr. Kathie Dike is Medical Director for [facility] Pulmonary Re[facility].

## 2020-11-28 ENCOUNTER — Telehealth (HOSPITAL_COMMUNITY): Payer: Medicare Other | Admitting: Oncology

## 2020-11-28 ENCOUNTER — Ambulatory Visit (HOSPITAL_COMMUNITY): Payer: Medicare Other | Admitting: Hematology

## 2020-11-28 DIAGNOSIS — Z0001 Encounter for general adult medical examination with abnormal findings: Secondary | ICD-10-CM | POA: Diagnosis not present

## 2020-11-28 DIAGNOSIS — Z720 Tobacco use: Secondary | ICD-10-CM | POA: Diagnosis not present

## 2020-11-28 DIAGNOSIS — I219 Acute myocardial infarction, unspecified: Secondary | ICD-10-CM | POA: Diagnosis not present

## 2020-11-28 DIAGNOSIS — D72829 Elevated white blood cell count, unspecified: Secondary | ICD-10-CM | POA: Diagnosis not present

## 2020-11-28 DIAGNOSIS — Z712 Person consulting for explanation of examination or test findings: Secondary | ICD-10-CM | POA: Diagnosis not present

## 2020-11-28 DIAGNOSIS — Q2733 Arteriovenous malformation of digestive system vessel: Secondary | ICD-10-CM | POA: Diagnosis not present

## 2020-11-28 DIAGNOSIS — K922 Gastrointestinal hemorrhage, unspecified: Secondary | ICD-10-CM | POA: Diagnosis not present

## 2020-11-29 ENCOUNTER — Encounter (HOSPITAL_COMMUNITY): Payer: Medicare Other

## 2020-12-02 ENCOUNTER — Encounter (HOSPITAL_COMMUNITY)
Admission: RE | Admit: 2020-12-02 | Discharge: 2020-12-02 | Disposition: A | Discharge status: Home / Self Care | Payer: Medicare Other | Source: Ambulatory Visit | Attending: Cardiology | Admitting: Cardiology

## 2020-12-02 ENCOUNTER — Other Ambulatory Visit: Payer: Self-pay

## 2020-12-02 VITALS — Wt 287.3 lb

## 2020-12-02 DIAGNOSIS — I214 Non-ST elevation (NSTEMI) myocardial infarction: Secondary | ICD-10-CM | POA: Diagnosis not present

## 2020-12-02 DIAGNOSIS — Z955 Presence of coronary angioplasty implant and graft: Secondary | ICD-10-CM | POA: Diagnosis not present

## 2020-12-02 LAB — GLUCOSE, CAPILLARY: Glucose-Capillary: 163 mg/dL — ABNORMAL HIGH (ref 70–99)

## 2020-12-02 NOTE — Progress Notes (Signed)
Daily Session Note  Patient Details  Name: Gregory Alvarado MRN: [id number] Date of Birth: [birth year] Referring Provider:   Flowsheet Row CARDIAC REHAB PHASE II ORIENTATION from 11/14/2020 in [place]  Referring Provider Dr. Harl Bowie      Encounter Date: 12/02/2020  Check In:  Session Check In - 12/02/20 0930      Check-In   Supervising physician immediately available to respond to emergencies [facility] MD immediately available    Physician(s) Dr. Harl Bowie    Location [facility]    Staff Present Cathren Harsh, MS, Exercise Physiologist;Dalton Kris Mouton, MS, ACSM-CEP, Exercise Physiologist    Virtual Visit No    Medication changes reported     No    Fall or balance concerns reported    No    Tobacco Cessation No Change    Warm-up and Cool-down Performed as group-led instruction    Resistance Training Performed Yes    VAD Patient? No    PAD/SET Patient? No      Pain Assessment   Currently in Pain? No/denies    Multiple Pain Sites No           Capillary Blood Glucose: Results for orders placed or performed during the hospital encounter of 12/02/20 (from the past 24 hour(s))  Glucose, capillary     Status: Abnormal   Collection Time: 12/02/20  9:40 AM  Result Value Ref Range   Glucose-Capillary 163 (H) 70 - 99 mg/dL      Social History   Tobacco Use  Smoking Status Current Every Day Smoker  . Packs/day: 2.00  . Types: Cigarettes  Smokeless Tobacco Never Used    Goals Met:  Independence with exercise equipment Exercise tolerated well No report of cardiac concerns or symptoms Strength training completed today  Goals Unmet:  Not Applicable  Comments: check out 1030   Dr. Kathie Dike is Medical Director for [facility] Pulmonary Rehab.

## 2020-12-03 ENCOUNTER — Encounter: Payer: Self-pay | Admitting: Cardiology

## 2020-12-03 ENCOUNTER — Ambulatory Visit: Payer: Medicare Other | Admitting: Cardiology

## 2020-12-03 ENCOUNTER — Other Ambulatory Visit: Payer: Self-pay

## 2020-12-03 VITALS — BP 132/68 | HR 90 | Ht 72.0 in | Wt 289.0 lb

## 2020-12-03 DIAGNOSIS — I5022 Chronic systolic (congestive) heart failure: Secondary | ICD-10-CM

## 2020-12-03 DIAGNOSIS — I2510 Atherosclerotic heart disease of native coronary artery without angina pectoris: Secondary | ICD-10-CM | POA: Diagnosis not present

## 2020-12-03 MED ORDER — METOPROLOL SUCCINATE ER 100 MG PO TB24: 150.0000 mg | ORAL_TABLET | Freq: Every day | ORAL | 3 refills | Status: DC

## 2020-12-03 NOTE — Progress Notes (Signed)
Clinical Summary Gregory Alvarado is a 70 y.o.male seen today for a focused visit for history of CAD and chronic sysotlic HF  1.CAD/ICM/Chronic systolic HF - from notes initially chest pain and dyspnea, became unresponsive en route to ED - intubated in ER. Notes mention issues with COPD exacerbation vs aspiration event, no mention of a cardiac arrest  - diagnosed with NSTEMI during admission, had cath and 3 stents.  - admitted 12/29 to Jan 9  09/12/20 cath: LM LIs, LAD prox 70% and 90% mid, LCX 80% prox with thrombus, OM2 85%, RCA small non dom 60%. LVEDP elevated but number not given,  - received DES to prox/mid LAD, DES to prox LCX, DES to OM2.  08/2020 echo: LVEF 35-40% grade II DDx, mild MR   - last visit we increased toprol to 100mg  daily - tolerating well - chronic stable SOB/DOE. Some chest pains with exertion, pressure midchest, resolves with rest which stable.     2. COPD - severe COPD exacerbation during jan 2022 admission in [place], [place] requiring intubation    3. LE edema - cramps on both daily lasix and torsemide, even with low dosing and every other day dosing.    Past Medical History:  Diagnosis Date  . Anemia   . Arthritis    left shoulder  . COPD (chronic obstructive pulmonary disease) ([place])   . Coronary artery disease 09/11/2020   NSTEMI and 3 stents in [facility]  . Diabetes mellitus without complication ([place])   . Dyslipidemia   . Hypertension   . Insomnia   . Insomnia   . Morbid obesity ([place])   . Tobacco use      Allergies  Allergen Reactions  . Caduet [Amlodipine-Atorvastatin] Swelling     Current Outpatient Medications  Medication Sig Dispense Refill  . acetaminophen (TYLENOL) 325 MG tablet Take 2 tablets (650 mg total) by mouth every 6 (six) hours as needed for mild pain (or Fever >/= 101). 12 tablet 0  . aspirin EC 81 MG tablet Take 81 mg by mouth daily. Swallow whole.    Marland Kitchen atorvastatin (LIPITOR) 40 MG tablet Take 40  mg by mouth at bedtime.    . cyanocobalamin (,VITAMIN B-12,) 1000 MCG/ML injection Inject 1,000 mcg into the muscle every 30 (thirty) days.     Marland Kitchen docusate sodium (COLACE) 100 MG capsule Take 1 capsule (100 mg total) by mouth daily. 60 capsule 1  . folic acid (FOLVITE) 1 MG tablet Take 1 tablet (1 mg total) by mouth daily. (Patient taking differently: Take 1 mg by mouth at bedtime.) 30 tablet 2  . furosemide (LASIX) 40 MG tablet Take 1 tablet (40 mg total) by mouth daily as needed. 90 tablet 1  . gabapentin (NEURONTIN) 100 MG capsule Take 300 mg by mouth 2 (two) times daily.    . hydrochlorothiazide (HYDRODIURIL) 12.5 MG tablet Take 12.5 mg by mouth at bedtime.     . insulin NPH-regular Human (NOVOLIN 70/30) (70-30) 100 UNIT/ML injection Inject 40 Units into the skin in the morning and at bedtime. Inject 40 units in morning and 40 units every evening    . Insulin Pen Needle (B-D ULTRAFINE III SHORT PEN) 31G X 8 MM MISC 1 each by Does not apply route as directed. 100 each 3  . INSULIN SYRINGE .5CC/29G 29G X 1/2" 0.5 ML MISC Use to inject insulin 2 times a day 100 each 3  . isosorbide mononitrate (IMDUR) 30 MG 24 hr tablet Take 1  tablet (30 mg total) by mouth daily. May take 2 if needed 180 tablet 3  . lisinopril (PRINIVIL,ZESTRIL) 20 MG tablet Take 1 tablet (20 mg total) by mouth daily. 30 tablet 6  . metoprolol succinate (TOPROL XL) 100 MG 24 hr tablet Take 1 tablet (100 mg total) by mouth daily. (Patient taking differently: Take 50 mg by mouth daily.) 90 tablet 3  . naproxen (NAPROSYN) 500 MG tablet Take 500 mg by mouth 2 (two) times daily.    . nitroGLYCERIN (NITROSTAT) 0.4 MG SL tablet DISSOLVE 1 TABLET SUBLINGUALLY AS NEEDED FOR CHEST PAIN, MAY REPEAT EVERY 5 MINUTES. AFTER 3 CALL 911. 25 tablet 3  . ONE TOUCH ULTRA TEST test strip TEST FOUR TIMES DAILY AS DIRECTED. 150 each 5  . pantoprazole (PROTONIX) 40 MG tablet Take 1 tablet (40 mg total) by mouth 2 (two) times daily before a meal. 60 tablet  5  . polyethylene glycol (MIRALAX / GLYCOLAX) 17 g packet Take 17 g by mouth daily. 28 each 1  . prasugrel (EFFIENT) 10 MG TABS tablet Take 10 mg by mouth daily.    Marland Kitchen SPIRIVA HANDIHALER 18 MCG inhalation capsule Place 1 capsule into inhaler and inhale daily.    Marland Kitchen spironolactone (ALDACTONE) 25 MG tablet TAKE 1 TABLET BY MOUTH ONCE A DAY. 30 tablet 11  . SUMAtriptan (IMITREX) 25 MG tablet Take by mouth.    . SYMBICORT 160-4.5 MCG/ACT inhaler Inhale 2 puffs into the lungs 2 (two) times daily.    . traMADol (ULTRAM) 50 MG tablet Take 50-100 mg by mouth in the morning and at bedtime.    Marland Kitchen zolpidem (AMBIEN) 5 MG tablet Take 5 mg by mouth at bedtime as needed.     No current facility-administered medications for this visit.     Past Surgical History:  Procedure Laterality Date  . BIOPSY  10/28/2019   Procedure: BIOPSY;  Surgeon: Danie Binder, MD;  [facility];  Service: Endoscopy;;  gastric  . BIOPSY  04/15/2020   Procedure: BIOPSY;  Surgeon: Daneil Dolin, MD;  Location[facility];  Service: Endoscopy;;  gastric  . CARDIAC CATHETERIZATION    . COLON RESECTION  1990s?   DIVERTICULITIS  . COLONOSCOPY WITH PROPOFOL N/A 04/15/2020   Rourk: Poor colon prep. two 6 to 8 mm polyps removed from the hepatic flexure, tubular adenomas.  Repeat colonoscopy later this year.  . ESOPHAGOGASTRODUODENOSCOPY (EGD) WITH PROPOFOL N/A 10/28/2019   Dr. Oneida Alar: Multiple cratered gastric ulcers, gastritis/duodenitis.  Path showed H. pylori.  Patient treated with amoxicillin/Biaxin/PPI twice daily.  . ESOPHAGOGASTRODUODENOSCOPY (EGD) WITH PROPOFOL N/A 04/15/2020   Rourk: Patchy gastric erythema status post biopsy to document eradication of H. pylori.  Biopsy showed persistent H. pylori.  Previously noted gastric ulcer is completely healed.  . 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  Marland Kitchen HERNIA  REPAIR     UHR  . MALONEY DILATION N/A 04/15/2020   Procedure: Venia Minks DILATION;  Surgeon: Daneil Dolin, MD;  Location[facility];  Service: Endoscopy;  Laterality: N/A;  . POLYPECTOMY  04/15/2020   Procedure: POLYPECTOMY;  Surgeon: Daneil Dolin, MD;  [facility];  Service: Endoscopy;;  colon  . ROTATOR CUFF REPAIR Bilateral   . TYMPANOPLASTY       Allergies  Allergen Reactions  . Caduet [Amlodipine-Atorvastatin] Swelling      Family History  Problem Relation  Age of Onset  . Diabetes Mother   . Heart attack Mother   . Heart attack Father   . Heart attack Brother   . Colon cancer Neg Hx   . Stomach cancer Neg Hx      Social History Gregory Alvarado reports that he has been smoking cigarettes. He has been smoking about 2.00 packs per day. He has never used smokeless tobacco. Gregory Alvarado reports previous alcohol use.   Review of Systems CONSTITUTIONAL: No weight loss, fever, chills, weakness or fatigue.  HEENT: Eyes: No visual loss, blurred vision, double vision or yellow sclerae.No hearing loss, sneezing, congestion, runny nose or sore throat.  SKIN: No rash or itching.  CARDIOVASCULAR: per hpi RESPIRATORY: No shortness of breath, cough or sputum.  GASTROINTESTINAL: No anorexia, nausea, vomiting or diarrhea. No abdominal pain or blood.  GENITOURINARY: No burning on urination, no polyuria NEUROLOGICAL: No headache, dizziness, syncope, paralysis, ataxia, numbness or tingling in the extremities. No change in bowel or bladder control.  MUSCULOSKELETAL: No muscle, back pain, joint pain or stiffness.  LYMPHATICS: No enlarged nodes. No history of splenectomy.  PSYCHIATRIC: No history of depression or anxiety.  ENDOCRINOLOGIC: No reports of sweating, cold or heat intolerance. No polyuria or polydipsia.  Marland Kitchen   Physical Examination Today's Vitals   12/03/20 0810  BP: 132/68  Pulse: 90  SpO2: 93%  Weight: 289 lb (131.1 kg)  Height: 6' (1.829 m)   Body mass index is  39.2 kg/m.  Gen: resting comfortably, no acute distress HEENT: no scleral icterus, pupils equal round and reactive, no palptable cervical adenopathy,  CV: RRR, no m/r/g, no jvd Resp: Clear to auscultation bilaterally GI: abdomen is soft, non-tender, non-distended, normal bowel sounds, no hepatosplenomegaly MSK: extremities are warm, trace bilaterla edema Skin: warm, no rash Neuro:  no focal deficits Psych: appropriate affect   Diagnostic Studies 08/2018 echo Study Conclusions  - Left ventricle: The cavity size was normal. Wall thickness was increased increased in a pattern of mild to moderate LVH. Systolic function was mildly to moderately reduced. The estimated ejection fraction was in the range of 40% to 45%. Diffuse hypokinesis. Doppler parameters are consistent with abnormal left ventricular relaxation (grade 1 diastolic dysfunction). Doppler parameters are consistent with high ventricular filling pressure. - Aortic valve: Valve area (VTI): 3.22 cm^2. Valve area (Vmax): 2.74 cm^2. Valve area (Vmean): 2.29 cm^2. - Left atrium: The atrium was moderately dilated. - Atrial septum: No defect or patent foramen ovale was identified. - Techncically difficult study, recommend limited study with echocontrast to better clarify LVEF. Based on available images appears mildly decreased.  08/2018 contrast echo Study Conclusions  - Left ventricle: Limited study with Definity contrast. Systolic function was normal. The estimated ejection fraction was in the range of 55% to 60%. Wall motion was normal; there were no regional wall motion abnormalities. - Mitral valve: There was trivial regurgitation. - Pericardium, extracardiac: A prominent pericardial fat pad was present.  07/2020 echo IMPRESSIONS    1. Left ventricular ejection fraction, by estimation, is 55 to 60%. The  left ventricle has normal function. The left ventricle has no regional  wall  motion abnormalities. There is moderate left ventricular hypertrophy.  Left ventricular diastolic  parameters are indeterminate.  2. Right ventricular systolic function is normal. The right ventricular  size is normal.  3. The mitral valve is normal in structure. Mild mitral valve  regurgitation. No evidence of mitral stenosis.  4. The aortic valve is tricuspid. There is mild calcification of  the  aortic valve. There is mild thickening of the aortic valve. Aortic valve  regurgitation is not visualized. No aortic stenosis is present.  5. The inferior vena cava is normal in size with greater than 50%  respiratory variability, suggesting right atrial pressure of 3 mmHg.     Assessment and Plan  1. CAD/ICM/Chronic systolic HF - appears euvolemic. Chronic stable SOB/DOE, some chronic mild chest pains with higher levels of activity unchanged - increase toprol to 150mg  daily - recheck echo, if ongoing LV dysfunction continue titrating meds and would change ACEI to entresto  F/u pending echo results.        Arnoldo Lenis, M.D..

## 2020-12-03 NOTE — Patient Instructions (Addendum)
Medication Instructions:  INCREASE Toprol to 150 mg daily *If you need a refill on your cardiac medications before your next appointment, please call your pharmacy*   Lab Work: None If you have labs (blood work) drawn today and your tests are completely normal, you will receive your results only by: Marland Kitchen MyChart Message (if you have MyChart) OR . A paper copy in the mail If you have any lab test that is abnormal or we need to change your treatment, we will call you to review the results.   Testing/Procedures: Your physician has requested that you have an echocardiogram. Echocardiography is a painless test that uses sound waves to create images of your heart. It provides your doctor with information about the size and shape of your heart and how well your heart's chambers and valves are working. This procedure takes approximately one hour. There are no restrictions for this procedure.     Follow-Up: At [facility], you and your health needs are our priority.  As part of our continuing mission to provide you with exceptional heart care, we have created designated Provider Care Teams.  These Care Teams include your primary Cardiologist (physician) and Advanced Practice Providers (APPs -  Physician Assistants and Nurse Practitioners) who all work together to provide you with the care you need, when you need it.  We recommend signing up for the patient portal called "MyChart".  Sign up information is provided on this After Visit Summary.  MyChart is used to connect with patients for Virtual Visits (Telemedicine).  Patients are able to view lab/test results, encounter notes, upcoming appointments, etc.  Non-urgent messages can be sent to your provider as well.   To learn more about what you can do with MyChart, go to [email or web address].    Your next appointment:   Follow up pending Echocardiogram results  Other Instructions   Echocardiogram An echocardiogram is a test that uses sound  waves (ultrasound) to produce images of the heart. Images from an echocardiogram can provide important information about:  Heart size and shape.  The size and thickness and movement of your heart's walls.  Heart muscle function and strength.  Heart valve function or if you have stenosis. Stenosis is when the heart valves are too narrow.  If blood is flowing backward through the heart valves (regurgitation).  A tumor or infectious growth around the heart valves.  Areas of heart muscle that are not working well because of poor blood flow or injury from a heart attack.  Aneurysm detection. An aneurysm is a weak or damaged part of an artery wall. The wall bulges out from the normal force of blood pumping through the body. Tell a health care provider about:  Any allergies you have.  All medicines you are taking, including vitamins, herbs, eye drops, creams, and over-the-counter medicines.  Any blood disorders you have.  Any surgeries you have had.  Any medical conditions you have.  Whether you are pregnant or may be pregnant. What are the risks? Generally, this is a safe test. However, problems may occur, including an allergic reaction to dye (contrast) that may be used during the test. What happens before the test? No specific preparation is needed. You may eat and drink normally. What happens during the test?  You will take off your clothes from the waist up and put on a hospital gown.  Electrodes or electrocardiogram (ECG)patches may be placed on your chest. The electrodes or patches are then connected to a device that monitors  your heart rate and rhythm.  You will lie down on a table for an ultrasound exam. A gel will be applied to your chest to help sound waves pass through your skin.  A handheld device, called a transducer, will be pressed against your chest and moved over your heart. The transducer produces sound waves that travel to your heart and bounce back (or "echo"  back) to the transducer. These sound waves will be captured in real-time and changed into images of your heart that can be viewed on a video monitor. The images will be recorded on a computer and reviewed by your health care provider.  You may be asked to change positions or hold your breath for a short time. This makes it easier to get different views or better views of your heart.  In some cases, you may receive contrast through an IV in one of your veins. This can improve the quality of the pictures from your heart. The procedure may vary among health care providers and hospitals.   What can I expect after the test? You may return to your normal, everyday life, including diet, activities, and medicines, unless your health care provider tells you not to do that. Follow these instructions at home:  It is up to you to get the results of your test. Ask your health care provider, or the department that is doing the test, when your results will be ready.  Keep all follow-up visits. This is important. Summary  An echocardiogram is a test that uses sound waves (ultrasound) to produce images of the heart.  Images from an echocardiogram can provide important information about the size and shape of your heart, heart muscle function, heart valve function, and other possible heart problems.  You do not need to do anything to prepare before this test. You may eat and drink normally.  After the echocardiogram is completed, you may return to your normal, everyday life, unless your health care provider tells you not to do that. This information is not intended to replace advice given to you by your health care provider. Make sure you discuss any questions you have with your health care provider. Document Revised: 04/23/2020 Document Reviewed: 04/23/2020 Elsevier Patient Education  2021 [employer].

## 2020-12-04 ENCOUNTER — Encounter (HOSPITAL_COMMUNITY)
Admission: RE | Admit: 2020-12-04 | Discharge: 2020-12-04 | Disposition: A | Discharge status: Home / Self Care | Payer: Medicare Other | Source: Ambulatory Visit | Attending: Cardiology | Admitting: Cardiology

## 2020-12-04 DIAGNOSIS — M79606 Pain in leg, unspecified: Secondary | ICD-10-CM | POA: Diagnosis not present

## 2020-12-04 DIAGNOSIS — G894 Chronic pain syndrome: Secondary | ICD-10-CM | POA: Diagnosis not present

## 2020-12-04 DIAGNOSIS — D509 Iron deficiency anemia, unspecified: Secondary | ICD-10-CM | POA: Diagnosis not present

## 2020-12-04 DIAGNOSIS — I214 Non-ST elevation (NSTEMI) myocardial infarction: Secondary | ICD-10-CM | POA: Diagnosis not present

## 2020-12-04 DIAGNOSIS — J449 Chronic obstructive pulmonary disease, unspecified: Secondary | ICD-10-CM | POA: Diagnosis not present

## 2020-12-04 DIAGNOSIS — J441 Chronic obstructive pulmonary disease with (acute) exacerbation: Secondary | ICD-10-CM | POA: Diagnosis not present

## 2020-12-04 DIAGNOSIS — I10 Essential (primary) hypertension: Secondary | ICD-10-CM | POA: Diagnosis not present

## 2020-12-04 DIAGNOSIS — Z955 Presence of coronary angioplasty implant and graft: Secondary | ICD-10-CM | POA: Diagnosis not present

## 2020-12-04 DIAGNOSIS — G4700 Insomnia, unspecified: Secondary | ICD-10-CM | POA: Diagnosis not present

## 2020-12-04 DIAGNOSIS — Z720 Tobacco use: Secondary | ICD-10-CM | POA: Diagnosis not present

## 2020-12-04 DIAGNOSIS — R0902 Hypoxemia: Secondary | ICD-10-CM | POA: Diagnosis not present

## 2020-12-04 DIAGNOSIS — D72829 Elevated white blood cell count, unspecified: Secondary | ICD-10-CM | POA: Diagnosis not present

## 2020-12-04 DIAGNOSIS — E782 Mixed hyperlipidemia: Secondary | ICD-10-CM | POA: Diagnosis not present

## 2020-12-04 DIAGNOSIS — E1129 Type 2 diabetes mellitus with other diabetic kidney complication: Secondary | ICD-10-CM | POA: Diagnosis not present

## 2020-12-04 NOTE — Progress Notes (Signed)
Daily Session Note  Patient Details  Name: Gregory Alvarado MRN: [id number] Date of Birth: 08-07-1951 Referring Provider:   Flowsheet Row CARDIAC REHAB PHASE II ORIENTATION from 11/14/2020 in [place]  Referring Provider Dr. Harl Bowie      Encounter Date: 12/04/2020  Check In:  Session Check In - 12/04/20 0930      Check-In   Supervising physician immediately available to respond to emergencies [facility] MD immediately available    Physician(s) Dr. Harl Bowie    Location [facility]    Staff Present Cathren Harsh, MS, Exercise Physiologist;Dalton Kris Mouton, MS, ACSM-CEP, Exercise Physiologist    Virtual Visit No    Medication changes reported     No    Fall or balance concerns reported    No    Tobacco Cessation No Change    Warm-up and Cool-down Performed as group-led instruction    Resistance Training Performed Yes    VAD Patient? No    PAD/SET Patient? No      Pain Assessment   Currently in Pain? No/denies    Multiple Pain Sites No           Capillary Blood Glucose: No results found for this or any previous visit (from the past 24 hour(s)).    Social History   Tobacco Use  Smoking Status Current Every Day Smoker  . Packs/day: 2.00  . Types: Cigarettes  Smokeless Tobacco Never Used    Goals Met:  Independence with exercise equipment Exercise tolerated well No report of cardiac concerns or symptoms Strength training completed today  Goals Unmet:  Not Applicable  Comments: check out 1030   Dr. Kathie Dike is Medical Director for [facility].

## 2020-12-06 ENCOUNTER — Other Ambulatory Visit: Payer: Self-pay

## 2020-12-06 ENCOUNTER — Encounter (HOSPITAL_COMMUNITY)
Admission: RE | Admit: 2020-12-06 | Discharge: 2020-12-06 | Disposition: A | Discharge status: Home / Self Care | Payer: Medicare Other | Source: Ambulatory Visit | Attending: Cardiology | Admitting: Cardiology

## 2020-12-06 DIAGNOSIS — Z955 Presence of coronary angioplasty implant and graft: Secondary | ICD-10-CM

## 2020-12-06 DIAGNOSIS — I214 Non-ST elevation (NSTEMI) myocardial infarction: Secondary | ICD-10-CM | POA: Diagnosis not present

## 2020-12-06 NOTE — Progress Notes (Signed)
Daily Session Note  Patient Details  Name: Gregory Alvarado MRN: [id number] Date of Birth: Jul 04, 1951 Referring Provider:   Flowsheet Row CARDIAC REHAB PHASE II ORIENTATION from 11/14/2020 in [place]  Referring Provider Dr. Harl Bowie      Encounter Date: 12/06/2020  Check In:  Session Check In - 12/06/20 0930      Check-In   Supervising physician immediately available to respond to emergencies [facility] MD immediately available    Physician(s) Dr. Harrington Challenger    [facility]    Staff Present Cathren Harsh, MS, Exercise Physiologist;Dalton Kris Mouton, MS, ACSM-CEP, Exercise Physiologist    Virtual Visit No    Medication changes reported     No    Fall or balance concerns reported    No    Tobacco Cessation No Change    Warm-up and Cool-down Performed as group-led instruction    Resistance Training Performed Yes    VAD Patient? No    PAD/SET Patient? No      Pain Assessment   Currently in Pain? No/denies    Multiple Pain Sites No           Capillary Blood Glucose: No results found for this or any previous visit (from the past 24 hour(s)).    Social History   Tobacco Use  Smoking Status Current Every Day Smoker  . Packs/day: 2.00  . Types: Cigarettes  Smokeless Tobacco Never Used    Goals Met:  Independence with exercise equipment Exercise tolerated well No report of cardiac concerns or symptoms Strength training completed today  Goals Unmet:  Not Applicable  Comments: check out 1030   Dr. Kathie Dike is Medical Director for [facility].

## 2020-12-09 ENCOUNTER — Encounter (HOSPITAL_COMMUNITY): Payer: Medicare Other

## 2020-12-10 ENCOUNTER — Other Ambulatory Visit: Payer: Self-pay | Admitting: Gastroenterology

## 2020-12-11 ENCOUNTER — Encounter (HOSPITAL_COMMUNITY)
Admission: RE | Admit: 2020-12-11 | Discharge: 2020-12-11 | Disposition: A | Discharge status: Home / Self Care | Payer: Medicare Other | Source: Ambulatory Visit | Attending: Cardiology | Admitting: Cardiology

## 2020-12-11 ENCOUNTER — Other Ambulatory Visit: Payer: Self-pay

## 2020-12-11 DIAGNOSIS — G894 Chronic pain syndrome: Secondary | ICD-10-CM | POA: Diagnosis not present

## 2020-12-11 DIAGNOSIS — E1129 Type 2 diabetes mellitus with other diabetic kidney complication: Secondary | ICD-10-CM | POA: Diagnosis not present

## 2020-12-11 DIAGNOSIS — E782 Mixed hyperlipidemia: Secondary | ICD-10-CM | POA: Diagnosis not present

## 2020-12-11 DIAGNOSIS — I214 Non-ST elevation (NSTEMI) myocardial infarction: Secondary | ICD-10-CM | POA: Diagnosis not present

## 2020-12-11 DIAGNOSIS — Z955 Presence of coronary angioplasty implant and graft: Secondary | ICD-10-CM

## 2020-12-11 DIAGNOSIS — D72829 Elevated white blood cell count, unspecified: Secondary | ICD-10-CM | POA: Diagnosis not present

## 2020-12-11 DIAGNOSIS — I10 Essential (primary) hypertension: Secondary | ICD-10-CM | POA: Diagnosis not present

## 2020-12-11 DIAGNOSIS — Z720 Tobacco use: Secondary | ICD-10-CM | POA: Diagnosis not present

## 2020-12-11 DIAGNOSIS — R0902 Hypoxemia: Secondary | ICD-10-CM | POA: Diagnosis not present

## 2020-12-11 DIAGNOSIS — M79606 Pain in leg, unspecified: Secondary | ICD-10-CM | POA: Diagnosis not present

## 2020-12-11 DIAGNOSIS — J441 Chronic obstructive pulmonary disease with (acute) exacerbation: Secondary | ICD-10-CM | POA: Diagnosis not present

## 2020-12-11 DIAGNOSIS — D509 Iron deficiency anemia, unspecified: Secondary | ICD-10-CM | POA: Diagnosis not present

## 2020-12-11 NOTE — Progress Notes (Signed)
Daily Session Note  Patient Details  Name: Gregory Alvarado MRN: [id number] Date of Birth: [birth year] Referring Provider:   Flowsheet Row CARDIAC REHAB PHASE II ORIENTATION from 11/14/2020 in [place]  Referring Provider Dr. Harl Bowie      Encounter Date: 12/11/2020  Check In:  Session Check In - 12/11/20 0930      Check-In   Supervising physician immediately available to respond to emergencies [facility] MD immediately available    Physician(s) Dr. Domenic Polite    Location [facility]    Staff Present Cathren Harsh, MS, Exercise Physiologist;Dalton Kris Mouton, MS, ACSM-CEP, Exercise Physiologist    Virtual Visit No    Medication changes reported     No    Fall or balance concerns reported    No    Tobacco Cessation No Change    Warm-up and Cool-down Performed as group-led instruction    Resistance Training Performed Yes    VAD Patient? No    PAD/SET Patient? No      Pain Assessment   Currently in Pain? No/denies    Multiple Pain Sites No           Capillary Blood Glucose: No results found for this or any previous visit (from the past 24 hour(s)).    Social History   Tobacco Use  Smoking Status Current Every Day Smoker  . Packs/day: 2.00  . Types: Cigarettes  Smokeless Tobacco Never Used    Goals Met:  Independence with exercise equipment Exercise tolerated well No report of cardiac concerns or symptoms Strength training completed today  Goals Unmet:  Not Applicable  Comments: check out 1030   Dr. Kathie Dike is Medical Director for [facility].

## 2020-12-13 ENCOUNTER — Encounter (HOSPITAL_COMMUNITY): Payer: Medicare Other

## 2020-12-16 ENCOUNTER — Encounter (HOSPITAL_COMMUNITY): Payer: Medicare Other

## 2020-12-18 ENCOUNTER — Encounter (HOSPITAL_COMMUNITY): Payer: Medicare Other

## 2020-12-18 NOTE — Progress Notes (Signed)
Cardiac Individual Treatment Plan  Patient Details  Name: Gregory Alvarado MRN: [id number] Date of Birth: [birth year] Referring Provider:   Flowsheet Row CARDIAC REHAB PHASE II ORIENTATION from 11/14/2020 in [place]  Referring Provider Dr. Harl Bowie      Initial Encounter Date:  Flowsheet Row CARDIAC REHAB PHASE II ORIENTATION from 11/14/2020 in [place]  Date 11/14/20      Visit Diagnosis: S/P coronary artery stent placement  NSTEMI (non-ST elevated myocardial infarction) [facility])  Patient's Home Medications on Admission:  Current Outpatient Medications:  .  acetaminophen (TYLENOL) 325 MG tablet, Take 2 tablets (650 mg total) by mouth every 6 (six) hours as needed for mild pain (or Fever >/= 101)., Disp: 12 tablet, Rfl: 0 .  aspirin EC 81 MG tablet, Take 81 mg by mouth daily. Swallow whole., Disp: , Rfl:  .  atorvastatin (LIPITOR) 40 MG tablet, Take 40 mg by mouth at bedtime., Disp: , Rfl:  .  cyanocobalamin (,VITAMIN B-12,) 1000 MCG/ML injection, Inject 1,000 mcg into the muscle every 30 (thirty) days. , Disp: , Rfl:  .  docusate sodium (COLACE) 100 MG capsule, Take 1 capsule (100 mg total) by mouth daily., Disp: 60 capsule, Rfl: 1 .  folic acid (FOLVITE) 1 MG tablet, Take 1 tablet (1 mg total) by mouth daily. (Patient taking differently: Take 1 mg by mouth at bedtime.), Disp: 30 tablet, Rfl: 2 .  furosemide (LASIX) 40 MG tablet, Take 1 tablet (40 mg total) by mouth daily as needed., Disp: 90 tablet, Rfl: 1 .  gabapentin (NEURONTIN) 100 MG capsule, Take 300 mg by mouth 2 (two) times daily., Disp: , Rfl:  .  hydrochlorothiazide (HYDRODIURIL) 12.5 MG tablet, Take 12.5 mg by mouth at bedtime. , Disp: , Rfl:  .  insulin NPH-regular Human (NOVOLIN 70/30) (70-30) 100 UNIT/ML injection, Inject 40 Units into the skin in the morning and at bedtime. Inject 40 units in morning and 40 units every evening, Disp: , Rfl:  .  Insulin Pen Needle (B-D ULTRAFINE  III SHORT PEN) 31G X 8 MM MISC, 1 each by Does not apply route as directed., Disp: 100 each, Rfl: 3 .  INSULIN SYRINGE .5CC/29G 29G X 1/2" 0.5 ML MISC, Use to inject insulin 2 times a day, Disp: 100 each, Rfl: 3 .  isosorbide mononitrate (IMDUR) 30 MG 24 hr tablet, Take 1 tablet (30 mg total) by mouth daily. May take 2 if needed, Disp: 180 tablet, Rfl: 3 .  lisinopril (PRINIVIL,ZESTRIL) 20 MG tablet, Take 1 tablet (20 mg total) by mouth daily., Disp: 30 tablet, Rfl: 6 .  metoprolol succinate (TOPROL XL) 100 MG 24 hr tablet, Take 1.5 tablets (150 mg total) by mouth daily. Take with or immediately following a meal., Disp: 180 tablet, Rfl: 3 .  naproxen (NAPROSYN) 500 MG tablet, Take 500 mg by mouth 2 (two) times daily., Disp: , Rfl:  .  nitroGLYCERIN (NITROSTAT) 0.4 MG SL tablet, DISSOLVE 1 TABLET SUBLINGUALLY AS NEEDED FOR CHEST PAIN, MAY REPEAT EVERY 5 MINUTES. AFTER 3 CALL 911., Disp: 25 tablet, Rfl: 3 .  ONE TOUCH ULTRA TEST test strip, TEST FOUR TIMES DAILY AS DIRECTED., Disp: 150 each, Rfl: 5 .  pantoprazole (PROTONIX) 40 MG tablet, TAKE ONE TABLET BY MOUTH 2 TIMES A DAY BEFORE A MEAL, Disp: 60 tablet, Rfl: 5 .  polyethylene glycol (MIRALAX / GLYCOLAX) 17 g packet, Take 17 g by mouth daily., Disp: 28 each, Rfl: 1 .  prasugrel (EFFIENT) 10 MG  TABS tablet, Take 10 mg by mouth daily., Disp: , Rfl:  .  SPIRIVA HANDIHALER 18 MCG inhalation capsule, Place 1 capsule into inhaler and inhale daily., Disp: , Rfl:  .  spironolactone (ALDACTONE) 25 MG tablet, TAKE 1 TABLET BY MOUTH ONCE A DAY., Disp: 30 tablet, Rfl: 11 .  SUMAtriptan (IMITREX) 25 MG tablet, Take by mouth., Disp: , Rfl:  .  SYMBICORT 160-4.5 MCG/ACT inhaler, Inhale 2 puffs into the lungs 2 (two) times daily., Disp: , Rfl:  .  traMADol (ULTRAM) 50 MG tablet, Take 50-100 mg by mouth in the morning and at bedtime., Disp: , Rfl:  .  zolpidem (AMBIEN) 5 MG tablet, Take 5 mg by mouth at bedtime as needed., Disp: , Rfl:   Past Medical  History: Past Medical History:  Diagnosis Date  . Anemia   . Arthritis    left shoulder  . COPD (chronic obstructive pulmonary disease) ([place])   . Coronary artery disease 09/11/2020   NSTEMI and 3 stents in [facility]  . Diabetes mellitus without complication ([place])   . Dyslipidemia   . Hypertension   . Insomnia   . Insomnia   . Morbid obesity ([place])   . Tobacco use     Tobacco Use: Social History   Tobacco Use  Smoking Status Current Every Day Smoker  . Packs/day: 2.00  . Types: Cigarettes  Smokeless Tobacco Never Used    Labs: Recent Review Flowsheet Data    Labs for ITP Cardiac and Pulmonary Rehab Latest Ref Rng & Units 11/03/2016 02/18/2017 10/26/2019 07/22/2020   Cholestrol 100 - 199 mg/dL - 186 - -   LDLCALC 0 - 99 mg/dL - 92 - -   HDL >39 mg/dL - 28(L) - -   Trlycerides 0 - 149 mg/dL - 329(H) - -   Hemoglobin A1c 4.8 - 5.6 % 13.2 11.3(H) 6.2(H) 5.7(H)      Capillary Blood Glucose: Lab Results  Component Value Date   GLUCAP 163 (H) 12/02/2020   GLUCAP 109 (H) 11/25/2020   GLUCAP 115 (H) 11/22/2020   GLUCAP 102 (H) 11/22/2020   GLUCAP 106 (H) 11/22/2020    POCT Glucose    Row Name 11/14/20 1231             POCT Blood Glucose   Pre-Exercise 106 mg/dL  His blood glucose was 59 when he arrived. He was given orange juice and cheese crackers. He was rechecked about 30 minutes later and was 106. He needed to be at least 110 to exercise. He ate another pack of crackers and was able to increase to 123.              Exercise Target Goals: Exercise Program Goal: Individual exercise prescription set using results from initial 6 min walk test and THRR while considering  patient's activity barriers and safety.   Exercise Prescription Goal: Starting with aerobic activity 30 plus minutes a day, 3 days per week for initial exercise prescription. Provide home exercise prescription and guidelines that participant acknowledges understanding prior to  discharge.  Activity Barriers & Risk Stratification:  Activity Barriers & Cardiac Risk Stratification - 11/14/20 0831      Activity Barriers & Cardiac Risk Stratification   Activity Barriers Back Problems;Joint Problems;Deconditioning;Shortness of Breath;Balance Concerns;History of Falls    Cardiac Risk Stratification High           6 Minute Walk:  6 Minute Walk    Row Name 11/14/20 1026  6 Minute Walk   Phase Initial     Distance 500 feet     Walk Time 6 minutes     # of Rest Breaks 1  took a 3 min break     MPH 0.9     METS 0.81     RPE 9     VO2 Peak 2.84     Symptoms Yes (comment)     Comments pain in quads 7/10     Resting HR 76 bpm     Resting BP 108/52     Resting Oxygen Saturation  98 %     Exercise Oxygen Saturation  during 6 min walk 98 %     Max Ex. HR 93 bpm     Max Ex. BP 122/48     2 Minute Post BP 102/48            Oxygen Initial Assessment:   Oxygen Re-Evaluation:   Oxygen Discharge (Final Oxygen Re-Evaluation):   Initial Exercise Prescription:  Initial Exercise Prescription - 11/14/20 1000      Date of Initial Exercise RX and Referring Provider   Date 11/14/20    Referring Provider Dr. Harl Bowie    Expected Discharge Date 02/05/21      NuStep   Level 1    SPM 60    Minutes 39      Prescription Details   Frequency (times per week) 3    Duration Progress to 30 minutes of continuous aerobic without signs/symptoms of physical distress      Intensity   THRR 40-80% of Max Heartrate 61-122    Ratings of Perceived Exertion 11-13      Progression   Progression Continue progressive overload as per policy without signs/symptoms or physical distress.      Resistance Training   Training Prescription Yes    Weight 3 lbs    Reps 10-15           Perform Capillary Blood Glucose checks as needed.  Exercise Prescription Changes:   Exercise Prescription Changes    Row Name 12/02/20 1000 12/11/20 1200           Response to  Exercise   Blood Pressure (Admit) 116/42 124/50      Blood Pressure (Exercise) 140/60 132/56      Blood Pressure (Exit) 112/42 108/46      Heart Rate (Admit) 72 bpm 76 bpm      Heart Rate (Exercise) 98 bpm 106 bpm      Heart Rate (Exit) 77 bpm 85 bpm      Rating of Perceived Exertion (Exercise) 12 12      Duration Continue with 30 min of aerobic exercise without signs/symptoms of physical distress. Continue with 30 min of aerobic exercise without signs/symptoms of physical distress.      Intensity THRR unchanged THRR unchanged             Progression   Progression Continue to progress workloads to maintain intensity without signs/symptoms of physical distress. Continue to progress workloads to maintain intensity without signs/symptoms of physical distress.             Resistance Training   Training Prescription Yes Yes      Weight 3 lbs 3 lbs      Reps 10-15 10-15      Time 10 Minutes 10 Minutes             NuStep   Level 1 1  SPM 86 81      Minutes 39 39      METs 1.9 1.9             Exercise Comments:   Exercise Comments    Row Name 11/18/20 1039 11/20/20 1040         Exercise Comments Patient came at the wrong time and did not want to stay and wait for his class time. patient was unable to exercise today due to low blood glucose levels.             Exercise Goals and Review:   Exercise Goals    Row Name 11/14/20 1029 11/18/20 1142 12/11/20 0825         Exercise Goals   Increase Physical Activity Yes Yes Yes     Intervention Provide advice, education, support and counseling about physical activity/exercise needs.;Develop an individualized exercise prescription for aerobic and resistive training based on initial evaluation findings, risk stratification, comorbidities and participant's personal goals. Provide advice, education, support and counseling about physical activity/exercise needs.;Develop an individualized exercise prescription for aerobic and  resistive training based on initial evaluation findings, risk stratification, comorbidities and participant's personal goals. Provide advice, education, support and counseling about physical activity/exercise needs.;Develop an individualized exercise prescription for aerobic and resistive training based on initial evaluation findings, risk stratification, comorbidities and participant's personal goals.     Expected Outcomes Short Term: Attend rehab on a regular basis to increase amount of physical activity.;Long Term: Add in home exercise to make exercise part of routine and to increase amount of physical activity.;Long Term: Exercising regularly at least 3-5 days a week. Short Term: Attend rehab on a regular basis to increase amount of physical activity.;Long Term: Add in home exercise to make exercise part of routine and to increase amount of physical activity.;Long Term: Exercising regularly at least 3-5 days a week. Short Term: Attend rehab on a regular basis to increase amount of physical activity.;Long Term: Add in home exercise to make exercise part of routine and to increase amount of physical activity.;Long Term: Exercising regularly at least 3-5 days a week.     Increase Strength and Stamina Yes Yes Yes     Intervention Provide advice, education, support and counseling about physical activity/exercise needs.;Develop an individualized exercise prescription for aerobic and resistive training based on initial evaluation findings, risk stratification, comorbidities and participant's personal goals. Provide advice, education, support and counseling about physical activity/exercise needs.;Develop an individualized exercise prescription for aerobic and resistive training based on initial evaluation findings, risk stratification, comorbidities and participant's personal goals. Provide advice, education, support and counseling about physical activity/exercise needs.;Develop an individualized exercise  prescription for aerobic and resistive training based on initial evaluation findings, risk stratification, comorbidities and participant's personal goals.     Expected Outcomes Short Term: Increase workloads from initial exercise prescription for resistance, speed, and METs.;Short Term: Perform resistance training exercises routinely during rehab and add in resistance training at home;Long Term: Improve cardiorespiratory fitness, muscular endurance and strength as measured by increased METs and functional capacity (6MWT) Short Term: Increase workloads from initial exercise prescription for resistance, speed, and METs.;Short Term: Perform resistance training exercises routinely during rehab and add in resistance training at home;Long Term: Improve cardiorespiratory fitness, muscular endurance and strength as measured by increased METs and functional capacity (6MWT) Short Term: Increase workloads from initial exercise prescription for resistance, speed, and METs.;Short Term: Perform resistance training exercises routinely during rehab and add in resistance training at home;Long Term: Improve cardiorespiratory  fitness, muscular endurance and strength as measured by increased METs and functional capacity (6MWT)     Able to understand and use rate of perceived exertion (RPE) scale Yes Yes Yes     Intervention Provide education and explanation on how to use RPE scale Provide education and explanation on how to use RPE scale Provide education and explanation on how to use RPE scale     Expected Outcomes Short Term: Able to use RPE daily in rehab to express subjective intensity level;Long Term:  Able to use RPE to guide intensity level when exercising independently Short Term: Able to use RPE daily in rehab to express subjective intensity level;Long Term:  Able to use RPE to guide intensity level when exercising independently Short Term: Able to use RPE daily in rehab to express subjective intensity level;Long Term:   Able to use RPE to guide intensity level when exercising independently     Knowledge and understanding of Target Heart Rate Range (THRR) Yes Yes Yes     Intervention Provide education and explanation of THRR including how the numbers were predicted and where they are located for reference Provide education and explanation of THRR including how the numbers were predicted and where they are located for reference Provide education and explanation of THRR including how the numbers were predicted and where they are located for reference     Expected Outcomes Long Term: Able to use THRR to govern intensity when exercising independently;Short Term: Able to state/look up THRR;Short Term: Able to use daily as guideline for intensity in rehab Long Term: Able to use THRR to govern intensity when exercising independently;Short Term: Able to state/look up THRR;Short Term: Able to use daily as guideline for intensity in rehab Long Term: Able to use THRR to govern intensity when exercising independently;Short Term: Able to state/look up THRR;Short Term: Able to use daily as guideline for intensity in rehab     Able to check pulse independently Yes Yes Yes     Intervention Provide education and demonstration on how to check pulse in carotid and radial arteries.;Review the importance of being able to check your own pulse for safety during independent exercise Provide education and demonstration on how to check pulse in carotid and radial arteries.;Review the importance of being able to check your own pulse for safety during independent exercise Provide education and demonstration on how to check pulse in carotid and radial arteries.;Review the importance of being able to check your own pulse for safety during independent exercise     Expected Outcomes Short Term: Able to explain why pulse checking is important during independent exercise;Long Term: Able to check pulse independently and accurately Short Term: Able to explain  why pulse checking is important during independent exercise;Long Term: Able to check pulse independently and accurately Short Term: Able to explain why pulse checking is important during independent exercise;Long Term: Able to check pulse independently and accurately     Understanding of Exercise Prescription Yes Yes Yes     Intervention Provide education, explanation, and written materials on patient's individual exercise prescription Provide education, explanation, and written materials on patient's individual exercise prescription Provide education, explanation, and written materials on patient's individual exercise prescription     Expected Outcomes Short Term: Able to explain program exercise prescription;Long Term: Able to explain home exercise prescription to exercise independently Short Term: Able to explain program exercise prescription;Long Term: Able to explain home exercise prescription to exercise independently Short Term: Able to explain program exercise prescription;Long Term:  Able to explain home exercise prescription to exercise independently            Exercise Goals Re-Evaluation :  Exercise Goals Re-Evaluation    [place] Name 11/18/20 1142 12/11/20 1200           Exercise Goal Re-Evaluation   Exercise Goals Review Increase Physical Activity;Increase Strength and Stamina;Able to understand and use rate of perceived exertion (RPE) scale;Knowledge and understanding of Target Heart Rate Range (THRR);Able to check pulse independently;Understanding of Exercise Prescription Increase Physical Activity;Increase Strength and Stamina;Able to understand and use rate of perceived exertion (RPE) scale;Knowledge and understanding of Target Heart Rate Range (THRR);Able to check pulse independently;Understanding of Exercise Prescription      Comments Pt was set to begin exercise on 11/18/2020, but patient arrived at the wrong time and became argumentative with staff. Pt states he will return 11/20/2020.  Patient has completed 8 exercise sessions. He has tolerated exercise well for the most part. He takes frequent breaks to catch his breath and to give his legs a rest. He has taken less breaks since the beginning of the program. He is very deconditioned. He has to take breaks during the warm up and strength training. Most days he sits down to complete the warm up and resistance training. He is currently exercising at 1.9 METs on the NuStep. Will continue to monitor and progress as able.      Expected Outcomes Through exercise in the cardiac rehab program and by engaging in a home exercise program, the patient will get stronger and reach their goals. Through exercise in the cardiac rehab program and by engaging in a home exercise program, the patient will get stronger and reach their goals.              Discharge Exercise Prescription (Final Exercise Prescription Changes):  Exercise Prescription Changes - 12/11/20 1200      Response to Exercise   Blood Pressure (Admit) 124/50    Blood Pressure (Exercise) 132/56    Blood Pressure (Exit) 108/46    Heart Rate (Admit) 76 bpm    Heart Rate (Exercise) 106 bpm    Heart Rate (Exit) 85 bpm    Rating of Perceived Exertion (Exercise) 12    Duration Continue with 30 min of aerobic exercise without signs/symptoms of physical distress.    Intensity THRR unchanged      Progression   Progression Continue to progress workloads to maintain intensity without signs/symptoms of physical distress.      Resistance Training   Training Prescription Yes    Weight 3 lbs    Reps 10-15    Time 10 Minutes      NuStep   Level 1    SPM 81    Minutes 39    METs 1.9           Nutrition:  Target Goals: Understanding of nutrition guidelines, daily intake of sodium 1500mg , cholesterol 200mg , calories 30% from fat and 7% or less from saturated fats, daily to have 5 or more servings of fruits and vegetables.  Biometrics:  Pre Biometrics - 12/02/20 1032       Pre Biometrics   Weight 130.3 kg    BMI (Calculated) 38.95            Nutrition Therapy Plan and Nutrition Goals:  Nutrition Therapy & Goals - 11/18/20 0855      Personal Nutrition Goals   Comments Patient scored 110 on his diet assessment. We have 2 educational sessions  on heart healthy nutrition with handouts. We also offer our RD services here at [facility]. Patient declined.      Intervention Plan   Intervention Nutrition handout(s) given to patient.           Nutrition Assessments:  Nutrition Assessments - 11/14/20 0836      MEDFICTS Scores   Pre Score 110          MEDIFICTS Score Key:  ?70 Need to make dietary changes   40-70 Heart Healthy Diet  ? 40 Therapeutic Level Cholesterol Diet   Picture Your Plate Scores:  <17 Unhealthy dietary pattern with much room for improvement.  41-50 Dietary pattern unlikely to meet recommendations for good health and room for improvement.  51-60 More healthful dietary pattern, with some room for improvement.   >60 Healthy dietary pattern, although there may be some specific behaviors that could be improved.    Nutrition Goals Re-Evaluation:   Nutrition Goals Discharge (Final Nutrition Goals Re-Evaluation):   Psychosocial: Target Goals: Acknowledge presence or absence of significant depression and/or stress, maximize coping skills, provide positive support system. Participant is able to verbalize types and ability to use techniques and skills needed for reducing stress and depression.  Initial Review & Psychosocial Screening:  Initial Psych Review & Screening - 11/14/20 0833      Initial Review   Current issues with None Identified      Family Dynamics   Good Support System? Yes    Comments His niece Olivia Mackie) supports him. He has a special needs son who she looks after when he comes to his medical appointments. She also voices her concern about his continuing smoking.      Barriers   Psychosocial barriers to  participate in program The patient should benefit from training in stress management and relaxation.      Screening Interventions   Interventions Encouraged to exercise;To provide support and resources with identified psychosocial needs   He is interested in meeting a hypnotist to aid him in smoking cessation.   Expected Outcomes Short Term goal: Utilizing psychosocial counselor, staff and physician to assist with identification of specific Stressors or current issues interfering with healing process. Setting desired goal for each stressor or current issue identified.;Long Term Goal: Stressors or current issues are controlled or eliminated.;Short Term goal: Identification and review with participant of any Quality of Life or Depression concerns found by scoring the questionnaire.;Long Term goal: The participant improves quality of Life and PHQ9 Scores as seen by post scores and/or verbalization of changes           Quality of Life Scores:  Quality of Life - 11/14/20 1031      Quality of Life   Select Quality of Life      Quality of Life Scores   Health/Function Pre 15.4 %    Socioeconomic Pre 20.92 %    Psych/Spiritual Pre 9.43 %    Family Pre 14.8 %    GLOBAL Pre 15.05 %          Scores of 19 and below usually indicate a poorer quality of life in these areas.  A difference of  2-3 points is a clinically meaningful difference.  A difference of 2-3 points in the total score of the Quality of Life Index has been associated with significant improvement in overall quality of life, self-image, physical symptoms, and general health in studies assessing change in quality of life.  PHQ-9: Recent Review Flowsheet Data    Depression screen  [facility] 2/9 11/14/2020 08/06/2020 08/02/2020 06/09/2017 02/24/2017   Decreased Interest 0 0 0 0 0   Down, Depressed, Hopeless 0 0 0 0 0   PHQ - 2 Score 0 0 0 0 0   Altered sleeping 2 - - - -   Tired, decreased energy 3 - - - -   Change in appetite 0 - - - -    Feeling bad or failure about yourself  2 - - - -   Trouble concentrating 0 - - - -   Moving slowly or fidgety/restless 0 - - - -   Suicidal thoughts 0 - - - -   PHQ-9 Score 7 - - - -   Difficult doing work/chores Somewhat difficult - - - -     Interpretation of Total Score  Total Score Depression Severity:  1-4 = Minimal depression, 5-9 = Mild depression, 10-14 = Moderate depression, 15-19 = Moderately severe depression, 20-27 = Severe depression   Psychosocial Evaluation and Intervention:  Psychosocial Evaluation - 11/14/20 1224      Psychosocial Evaluation & Interventions   Interventions Stress management education;Relaxation education;Encouraged to exercise with the program and follow exercise prescription    Comments Patient is currently smoking 2 packs of cigarettes per day. He reports he smokes at home because he is bored, which stems from his lack of ability to perform his old hobbies due to his cardiac and pulmonary diseases. He states that he would like to quit smoking and believes being hypnotized will help. I will look into this to see if these services are provided anywhere locally. He otherwise has no identifiable psychosocial issues.    Expected Outcomes Patient will work on smoking cessation and will cut back or completely quit smoking by the time he finishes the cardiac rehab program.    Continue Psychosocial Services  Follow up required by staff           Psychosocial Re-Evaluation:  Psychosocial Re-Evaluation    [place] Name 11/18/20 0858 12/11/20 0813           Psychosocial Re-Evaluation   Current issues with None Identified None Identified      Comments Patient is new to the program starting today 11/18/20. He has no psychosocial issues identified. His initial QOL score was 15.7 and his PHQ-9 score was 7. He uses Ambien 5 mg for sleep. Patient continues to have no psychosocial issues identified completing 8 sessions. He continues to use Ambien for sleep. He  demonstrates a positive attitude and is interactive with staff and others in his class. Will continue to monitor for progress.      Expected Outcomes Patient will have no psychosocial issues identified at discharge. Patient will have no psychosocial issues identified at discharge.      Interventions Encouraged to attend Cardiac Rehabilitation for the exercise;Stress management education;Relaxation education Encouraged to attend Cardiac Rehabilitation for the exercise;Stress management education;Relaxation education      Continue Psychosocial Services  No Follow up required No Follow up required             Psychosocial Discharge (Final Psychosocial Re-Evaluation):  Psychosocial Re-Evaluation - 12/11/20 0813      Psychosocial Re-Evaluation   Current issues with None Identified    Comments Patient continues to have no psychosocial issues identified completing 8 sessions. He continues to use Ambien for sleep. He demonstrates a positive attitude and is interactive with staff and others in his class. Will continue to monitor for progress.    Expected  Outcomes Patient will have no psychosocial issues identified at discharge.    Interventions Encouraged to attend Cardiac Rehabilitation for the exercise;Stress management education;Relaxation education    Continue Psychosocial Services  No Follow up required           Vocational Rehabilitation: Provide vocational rehab assistance to qualifying candidates.   Vocational Rehab Evaluation & Intervention:  Vocational Rehab - 11/14/20 0845      Initial Vocational Rehab Evaluation & Intervention   Assessment shows need for Vocational Rehabilitation No      Vocational Rehab Re-Evaulation   Comments He is retired and does not want to go back to work.           Education: Education Goals: Education classes will be provided on a weekly basis, covering required topics. Participant will state understanding/return demonstration of topics  presented.  Learning Barriers/Preferences:  Learning Barriers/Preferences - 11/14/20 0840      Learning Barriers/Preferences   Learning Barriers None    Learning Preferences Skilled Demonstration           Education Topics: Hypertension, Hypertension Reduction -Define heart disease and high blood pressure. Discus how high blood pressure affects the body and ways to reduce high blood pressure.   Exercise and Your Heart -Discuss why it is important to exercise, the FITT principles of exercise, normal and abnormal responses to exercise, and how to exercise safely.   Angina -Discuss definition of angina, causes of angina, treatment of angina, and how to decrease risk of having angina.   Cardiac Medications -Review what the following cardiac medications are used for, how they affect the body, and side effects that may occur when taking the medications.  Medications include Aspirin, Beta blockers, calcium channel blockers, ACE Inhibitors, angiotensin receptor blockers, diuretics, digoxin, and antihyperlipidemics.   Congestive Heart Failure -Discuss the definition of CHF, how to live with CHF, the signs and symptoms of CHF, and how keep track of weight and sodium intake.   Heart Disease and Intimacy -Discus the effect sexual activity has on the heart, how changes occur during intimacy as we age, and safety during sexual activity.   Smoking Cessation / COPD -Discuss different methods to quit smoking, the health benefits of quitting smoking, and the definition of COPD.   Nutrition I: Fats -Discuss the types of cholesterol, what cholesterol does to the heart, and how cholesterol levels can be controlled.   Nutrition II: Labels -Discuss the different components of food labels and how to read food label   Heart Parts/Heart Disease and PAD -Discuss the anatomy of the heart, the pathway of blood circulation through the heart, and these are affected by heart disease.   Stress  I: Signs and Symptoms -Discuss the causes of stress, how stress may lead to anxiety and depression, and ways to limit stress. Flowsheet Row CARDIAC REHAB PHASE II EXERCISE from 12/11/2020 in [place]  Date 11/27/20  Educator mk  Instruction Review Code 2- Demonstrated Understanding      Stress II: Relaxation -Discuss different types of relaxation techniques to limit stress. Flowsheet Row CARDIAC REHAB PHASE II EXERCISE from 12/11/2020 in [place]  Date 12/04/20  Educator mk  Instruction Review Code 2- Demonstrated Understanding      Warning Signs of Stroke / TIA -Discuss definition of a stroke, what the signs and symptoms are of a stroke, and how to identify when someone is having stroke. Flowsheet Row CARDIAC REHAB PHASE II EXERCISE from 12/11/2020 in [place]  [facility]  Date 12/11/20  Educator mk  Instruction Review Code 2- Demonstrated Understanding      Knowledge Questionnaire Score:  Knowledge Questionnaire Score - 11/14/20 0841      Knowledge Questionnaire Score   Pre Score 23/28           Core Components/Risk Factors/Patient Goals at Admission:  Personal Goals and Risk Factors at Admission - 11/14/20 0846      Core Components/Risk Factors/Patient Goals on Admission    Weight Management Yes;Weight Loss    Expected Outcomes Short Term: Continue to assess and modify interventions until short term weight is achieved;Long Term: Adherence to nutrition and physical activity/exercise program aimed toward attainment of established weight goal;Weight Loss: Understanding of general recommendations for a balanced deficit meal plan, which promotes 1-2 lb weight loss per week and includes a negative energy balance of [phone] kcal/d;Understanding recommendations for meals to include 15-35% energy as protein, 25-35% energy from fat, 35-60% energy from carbohydrates, less than 200mg  of dietary cholesterol, 20-35 gm of  total fiber daily;Understanding of distribution of calorie intake throughout the day with the consumption of 4-5 meals/snacks    Tobacco Cessation Yes    Number of packs per day 2 packs per day    Intervention Assist the participant in steps to quit. Provide individualized education and counseling about committing to Tobacco Cessation, relapse prevention, and pharmacological support that can be provided by physician.;Advice worker, assist with locating and accessing local/national Quit Smoking programs, and support quit date choice.    Expected Outcomes Short Term: Will demonstrate readiness to quit, by selecting a quit date.;Short Term: Will quit all tobacco product use, adhering to prevention of relapse plan.;Long Term: Complete abstinence from all tobacco products for at least 12 months from quit date.    Improve shortness of breath with ADL's Yes    Intervention Provide education, individualized exercise plan and daily activity instruction to help decrease symptoms of SOB with activities of daily living.    Expected Outcomes Short Term: Improve cardiorespiratory fitness to achieve a reduction of symptoms when performing ADLs;Long Term: Be able to perform more ADLs without symptoms or delay the onset of symptoms    Diabetes Yes    Intervention Provide education about signs/symptoms and action to take for hypo/hyperglycemia.;Provide education about proper nutrition, including hydration, and aerobic/resistive exercise prescription along with prescribed medications to achieve blood glucose in normal ranges: Fasting glucose 65-99 mg/dL    Expected Outcomes Short Term: Participant verbalizes understanding of the signs/symptoms and immediate care of hyper/hypoglycemia, proper foot care and importance of medication, aerobic/resistive exercise and nutrition plan for blood glucose control.;Long Term: Attainment of HbA1C < 7%.    Hypertension Yes    Intervention Provide education on lifestyle  modifcations including regular physical activity/exercise, weight management, moderate sodium restriction and increased consumption of fresh fruit, vegetables, and low fat dairy, alcohol moderation, and smoking cessation.;Monitor prescription use compliance.    Expected Outcomes Short Term: Continued assessment and intervention until BP is < 140/77mm HG in hypertensive participants. < 130/7mm HG in hypertensive participants with diabetes, heart failure or chronic kidney disease.;Long Term: Maintenance of blood pressure at goal levels.           Core Components/Risk Factors/Patient Goals Review:   Goals and Risk Factor Review    Row Name 11/18/20 0856 12/11/20 0815           Core Components/Risk Factors/Patient Goals Review   Personal Goals Review Weight Management/Obesity;Tobacco Cessation Weight Management/Obesity;Tobacco Cessation;Diabetes  Review Patient referred to cardiac rehab with NSTEMI and stent placement. He has multiple risk factors for CAD and is participating in the program for risk modification. His personal goals for the program are to quit smoking and get stronger. We will monitor his progress as he works toward meeting these goals. Patient has completed 8 sessions gaining 2 lbs since his initial visit. He is doing ok in the program. He only does the nustep and does complain about his legs feeling weak. He is very decontitioned and continues to smoke 2ppd.  He saw his cardiologist Dr Harl Bowie 3/22 and complained of chest pain with exertion but has no complained in CR. He increased his Metoprolol to 150 mg daily. Patient's PCP has also made some adjustments in his insulin dosage after staff notifited him of  hypoglycemic epidoses at night and him not being able to exercise due to being lesss than 110mg /dl. His personal goals for the program are to quit smoking and get stronger. We will continue to monitor his progress as he works toward meeting these goals.      Expected Outcomes  Patient will complete the program meeting both personal and program goals. Patient will complete the program meeting both personal and program goals.             Core Components/Risk Factors/Patient Goals at Discharge (Final Review):   Goals and Risk Factor Review - 12/11/20 0815      Core Components/Risk Factors/Patient Goals Review   Personal Goals Review Weight Management/Obesity;Tobacco Cessation;Diabetes    Review Patient has completed 8 sessions gaining 2 lbs since his initial visit. He is doing ok in the program. He only does the nustep and does complain about his legs feeling weak. He is very decontitioned and continues to smoke 2ppd.  He saw his cardiologist Dr Harl Bowie 3/22 and complained of chest pain with exertion but has no complained in CR. He increased his Metoprolol to 150 mg daily. Patient's PCP has also made some adjustments in his insulin dosage after staff notifited him of  hypoglycemic epidoses at night and him not being able to exercise due to being lesss than 110mg /dl. His personal goals for the program are to quit smoking and get stronger. We will continue to monitor his progress as he works toward meeting these goals.    Expected Outcomes Patient will complete the program meeting both personal and program goals.           ITP Comments:  ITP Comments    Row Name 11/14/20 1234 11/18/20 1134 11/20/20 1253       ITP Comments Patient's blood glucose was 59 when he arrived for cardiac rehab orientation. He reported that he did not eat dinner the previous night nor had he ate breakfast. He was given orange juice and a pack of cheese crackers. When he was  rechecked, his blood glucose was 106. Since he is prescribed insulin, his CBG must be at least 110 to exercise. He was provided with another pack of crackers and when he was rechecked his CBG was 123. I explained to him that he will need to eat breakfast in the mornings before he comes to class. He voiced his understanding. Pt  arrived for his first exercise session today at 0830. Staff explained to patient that his appointment was not until 0930, so he did not need to arrive until 0915. Pt was given paperwork during his orientation on 11/14/20 stating his arrival time to exercise is 0915. Pt became argumentative  with staff stating that a staff member had told him to be here at 0830. I explained to patient that we do not have any class times at 0830, so a staff member would not have told him that. I apoligized for any misunderstanding that may have taken place. I asked patient if I had wrote the wrong time on his paperwork, and he admitted that he had not looked at any of his paperwork. Pt continued to be argumentative with staff and stated that "we needed to get our act together" and "that he would be calling his doctor". At this point it was 0845. Instead of waiting in the lobby, the patient decided to leave. When leaving, he stated, "I better leave before I get angry". Pt's appointment for the day was cancelled. Pt arrived for his exercise session today, 11/20/2020 at 0925. He reported that he did not eat breakfast and that he had not had any calories since the night before. Pt is on insulin, and per departmental guidelines his CBG must be at least 110 to exercise. I thoroughly discussed this with him at his orientation, as he also did not eat breakfast then, and his CBG was 59 upon arrival. When his CBG was taken today, it was 89. He was provided orange juice and rechecked about 15 minutes later and it was 94. He was not allowed to exercise. He reports that he usually does not eat breakfast until around 1100. We expressed concern with this, as his class time is 0930, and offered him to move to a later class that would accomodate his schedule. He decided that he would like to continue to try the 0930 class and will eat something on Friday before he comes.            Comments: ITP REVIEW Pt is making expected progress toward Cardiac  Rehab goals after completing 9 sessions. Recommend continued exercise, life style modification, education, and increased stamina and strength.

## 2020-12-19 ENCOUNTER — Other Ambulatory Visit: Payer: Self-pay

## 2020-12-19 ENCOUNTER — Ambulatory Visit (HOSPITAL_COMMUNITY)
Admission: RE | Admit: 2020-12-19 | Discharge: 2020-12-19 | Disposition: A | Discharge status: Home / Self Care | Payer: Medicare Other | Source: Ambulatory Visit | Attending: Cardiology | Admitting: Cardiology

## 2020-12-19 DIAGNOSIS — I5022 Chronic systolic (congestive) heart failure: Secondary | ICD-10-CM | POA: Diagnosis not present

## 2020-12-19 LAB — ECHOCARDIOGRAM COMPLETE
AR max vel: 2.52 cm2
AV Area VTI: 2.64 cm2
AV Area mean vel: 2.43 cm2
AV Mean grad: 4.5 mmHg
AV Peak grad: 8.1 mmHg
Ao pk vel: 1.42 m/s
Area-P 1/2: 2.89 cm2
S' Lateral: 3.6 cm

## 2020-12-19 NOTE — Progress Notes (Signed)
*  PRELIMINARY RESULTS* Echocardiogram 2D Echocardiogram has been performed.  Gregory Alvarado 12/19/2020, 4:08 PM

## 2020-12-20 ENCOUNTER — Encounter (HOSPITAL_COMMUNITY): Payer: Medicare Other

## 2020-12-23 ENCOUNTER — Encounter (HOSPITAL_COMMUNITY): Payer: Medicare Other

## 2020-12-25 ENCOUNTER — Encounter (HOSPITAL_COMMUNITY): Payer: Medicare Other

## 2020-12-27 ENCOUNTER — Encounter (HOSPITAL_COMMUNITY): Payer: Medicare Other

## 2020-12-30 ENCOUNTER — Encounter (HOSPITAL_COMMUNITY): Payer: Medicare Other

## 2021-01-01 ENCOUNTER — Encounter (HOSPITAL_COMMUNITY): Payer: Medicare Other

## 2021-01-03 ENCOUNTER — Encounter (HOSPITAL_COMMUNITY): Payer: Medicare Other

## 2021-01-03 DIAGNOSIS — K922 Gastrointestinal hemorrhage, unspecified: Secondary | ICD-10-CM | POA: Diagnosis not present

## 2021-01-03 DIAGNOSIS — D72829 Elevated white blood cell count, unspecified: Secondary | ICD-10-CM | POA: Diagnosis not present

## 2021-01-03 DIAGNOSIS — Z720 Tobacco use: Secondary | ICD-10-CM | POA: Diagnosis not present

## 2021-01-03 DIAGNOSIS — D519 Vitamin B12 deficiency anemia, unspecified: Secondary | ICD-10-CM | POA: Diagnosis not present

## 2021-01-03 DIAGNOSIS — I5022 Chronic systolic (congestive) heart failure: Secondary | ICD-10-CM | POA: Diagnosis not present

## 2021-01-04 ENCOUNTER — Other Ambulatory Visit: Payer: Self-pay | Admitting: Cardiology

## 2021-01-04 DIAGNOSIS — J449 Chronic obstructive pulmonary disease, unspecified: Secondary | ICD-10-CM | POA: Diagnosis not present

## 2021-01-06 ENCOUNTER — Emergency Department (HOSPITAL_COMMUNITY): Payer: Medicare Other

## 2021-01-06 ENCOUNTER — Encounter (HOSPITAL_COMMUNITY): Payer: Self-pay | Admitting: *Deleted

## 2021-01-06 ENCOUNTER — Observation Stay (HOSPITAL_COMMUNITY)
Admission: EM | Admit: 2021-01-06 | Discharge: 2021-01-07 | Disposition: A | Discharge status: Home / Self Care | Payer: Medicare Other | Attending: Internal Medicine | Admitting: Internal Medicine

## 2021-01-06 ENCOUNTER — Telehealth: Payer: Self-pay | Admitting: Gastroenterology

## 2021-01-06 ENCOUNTER — Encounter: Payer: Self-pay | Admitting: Internal Medicine

## 2021-01-06 ENCOUNTER — Other Ambulatory Visit: Payer: Self-pay

## 2021-01-06 ENCOUNTER — Encounter (HOSPITAL_COMMUNITY): Payer: Medicare Other

## 2021-01-06 DIAGNOSIS — D649 Anemia, unspecified: Principal | ICD-10-CM | POA: Insufficient documentation

## 2021-01-06 DIAGNOSIS — Z794 Long term (current) use of insulin: Secondary | ICD-10-CM | POA: Diagnosis not present

## 2021-01-06 DIAGNOSIS — I5032 Chronic diastolic (congestive) heart failure: Secondary | ICD-10-CM | POA: Insufficient documentation

## 2021-01-06 DIAGNOSIS — Z79899 Other long term (current) drug therapy: Secondary | ICD-10-CM | POA: Insufficient documentation

## 2021-01-06 DIAGNOSIS — I2510 Atherosclerotic heart disease of native coronary artery without angina pectoris: Secondary | ICD-10-CM | POA: Diagnosis not present

## 2021-01-06 DIAGNOSIS — Z20822 Contact with and (suspected) exposure to covid-19: Secondary | ICD-10-CM | POA: Insufficient documentation

## 2021-01-06 DIAGNOSIS — Z7982 Long term (current) use of aspirin: Secondary | ICD-10-CM | POA: Diagnosis not present

## 2021-01-06 DIAGNOSIS — I10 Essential (primary) hypertension: Secondary | ICD-10-CM | POA: Diagnosis not present

## 2021-01-06 DIAGNOSIS — J449 Chronic obstructive pulmonary disease, unspecified: Secondary | ICD-10-CM | POA: Diagnosis not present

## 2021-01-06 DIAGNOSIS — F17210 Nicotine dependence, cigarettes, uncomplicated: Secondary | ICD-10-CM | POA: Insufficient documentation

## 2021-01-06 DIAGNOSIS — I110 Hypertensive heart disease with heart failure: Secondary | ICD-10-CM | POA: Diagnosis not present

## 2021-01-06 DIAGNOSIS — D638 Anemia in other chronic diseases classified elsewhere: Secondary | ICD-10-CM

## 2021-01-06 DIAGNOSIS — E119 Type 2 diabetes mellitus without complications: Secondary | ICD-10-CM

## 2021-01-06 DIAGNOSIS — D62 Acute posthemorrhagic anemia: Secondary | ICD-10-CM | POA: Diagnosis present

## 2021-01-06 DIAGNOSIS — R531 Weakness: Secondary | ICD-10-CM | POA: Diagnosis not present

## 2021-01-06 LAB — CBC WITH DIFFERENTIAL/PLATELET
Abs Immature Granulocytes: 0.08 10*3/uL — ABNORMAL HIGH (ref 0.00–0.07)
Basophils Absolute: 0.1 10*3/uL (ref 0.0–0.1)
Basophils Relative: 1 %
Eosinophils Absolute: 0.2 10*3/uL (ref 0.0–0.5)
Eosinophils Relative: 2 %
HCT: 25.4 % — ABNORMAL LOW (ref 39.0–52.0)
Hemoglobin: 6.5 g/dL — CL (ref 13.0–17.0)
Immature Granulocytes: 1 %
Lymphocytes Relative: 6 %
Lymphs Abs: 0.7 10*3/uL (ref 0.7–4.0)
MCH: 19.3 pg — ABNORMAL LOW (ref 26.0–34.0)
MCHC: 25.6 g/dL — ABNORMAL LOW (ref 30.0–36.0)
MCV: 75.4 fL — ABNORMAL LOW (ref 80.0–100.0)
Monocytes Absolute: 0.7 10*3/uL (ref 0.1–1.0)
Monocytes Relative: 6 %
Neutro Abs: 10.4 10*3/uL — ABNORMAL HIGH (ref 1.7–7.7)
Neutrophils Relative %: 84 %
Platelets: 272 10*3/uL (ref 150–400)
RBC: 3.37 MIL/uL — ABNORMAL LOW (ref 4.22–5.81)
RDW: 20.9 % — ABNORMAL HIGH (ref 11.5–15.5)
WBC: 12.2 10*3/uL — ABNORMAL HIGH (ref 4.0–10.5)
nRBC: 0.2 % (ref 0.0–0.2)

## 2021-01-06 LAB — COMPREHENSIVE METABOLIC PANEL
ALT: 9 U/L (ref 0–44)
AST: 11 U/L — ABNORMAL LOW (ref 15–41)
Albumin: 3.3 g/dL — ABNORMAL LOW (ref 3.5–5.0)
Alkaline Phosphatase: 81 U/L (ref 38–126)
Anion gap: 9 (ref 5–15)
BUN: 23 mg/dL (ref 8–23)
CO2: 26 mmol/L (ref 22–32)
Calcium: 8.6 mg/dL — ABNORMAL LOW (ref 8.9–10.3)
Chloride: 100 mmol/L (ref 98–111)
Creatinine, Ser: 1.31 mg/dL — ABNORMAL HIGH (ref 0.61–1.24)
GFR, Estimated: 60 mL/min — ABNORMAL LOW (ref >60)
Glucose, Bld: 192 mg/dL — ABNORMAL HIGH (ref 70–99)
Potassium: 3.9 mmol/L (ref 3.5–5.1)
Sodium: 135 mmol/L (ref 135–145)
Total Bilirubin: 0.6 mg/dL (ref 0.3–1.2)
Total Protein: 6.7 g/dL (ref 6.5–8.1)

## 2021-01-06 LAB — RESP PANEL BY RT-PCR (FLU A&B, COVID) ARPGX2
Influenza A by PCR: NEGATIVE
Influenza B by PCR: NEGATIVE
SARS Coronavirus 2 by RT PCR: NEGATIVE

## 2021-01-06 LAB — RETICULOCYTES
Immature Retic Fract: 27.2 % — ABNORMAL HIGH (ref 2.3–15.9)
RBC.: 3.6 MIL/uL — ABNORMAL LOW (ref 4.22–5.81)
Retic Count, Absolute: 82.4 10*3/uL (ref 19.0–186.0)
Retic Ct Pct: 2.3 % (ref 0.4–3.1)

## 2021-01-06 LAB — PREPARE RBC (CROSSMATCH)

## 2021-01-06 LAB — HIV ANTIBODY (ROUTINE TESTING W REFLEX): HIV Screen 4th Generation wRfx: NONREACTIVE

## 2021-01-06 LAB — IRON AND TIBC
Iron: 13 ug/dL — ABNORMAL LOW (ref 45–182)
Saturation Ratios: 2 % — ABNORMAL LOW (ref 17.9–39.5)
TIBC: 523 ug/dL — ABNORMAL HIGH (ref 250–450)
UIBC: 510 ug/dL

## 2021-01-06 LAB — LACTATE DEHYDROGENASE: LDH: 175 U/L (ref 98–192)

## 2021-01-06 LAB — FERRITIN: Ferritin: 6 ng/mL — ABNORMAL LOW (ref 24–336)

## 2021-01-06 LAB — VITAMIN B12: Vitamin B-12: 390 pg/mL (ref 180–914)

## 2021-01-06 LAB — BRAIN NATRIURETIC PEPTIDE: B Natriuretic Peptide: 336 pg/mL — ABNORMAL HIGH (ref 0.0–100.0)

## 2021-01-06 LAB — GLUCOSE, CAPILLARY
Glucose-Capillary: 174 mg/dL — ABNORMAL HIGH (ref 70–99)
Glucose-Capillary: 139 mg/dL — ABNORMAL HIGH (ref 70–99)

## 2021-01-06 LAB — POC OCCULT BLOOD, ED: Fecal Occult Bld: NEGATIVE

## 2021-01-06 LAB — FOLATE: Folate: 10.8 ng/mL (ref >5.9)

## 2021-01-06 MED ORDER — INSULIN ASPART PROT & ASPART (70-30 MIX) 100 UNIT/ML ~~LOC~~ SUSP
30.0000 [IU] | Freq: Two times a day (BID) | SUBCUTANEOUS | Status: DC
  Administered 2021-01-06 – 2021-01-07 (×2): 30 [IU] via SUBCUTANEOUS
  Filled 2021-01-06: qty 10

## 2021-01-06 MED ORDER — SUMATRIPTAN SUCCINATE 50 MG PO TABS: 25.0000 mg | ORAL_TABLET | ORAL | Status: DC | PRN

## 2021-01-06 MED ORDER — UMECLIDINIUM BROMIDE 62.5 MCG/INH IN AEPB
1.0000 | INHALATION_SPRAY | Freq: Every day | RESPIRATORY_TRACT | Status: DC
  Administered 2021-01-07: 1 via RESPIRATORY_TRACT
  Filled 2021-01-06: qty 7

## 2021-01-06 MED ORDER — METOPROLOL SUCCINATE ER 50 MG PO TB24
150.0000 mg | ORAL_TABLET | Freq: Every day | ORAL | Status: DC
  Administered 2021-01-06 – 2021-01-07 (×2): 150 mg via ORAL
  Filled 2021-01-06 (×2): qty 3

## 2021-01-06 MED ORDER — FOLIC ACID 1 MG PO TABS
1.0000 mg | ORAL_TABLET | Freq: Every day | ORAL | Status: DC
  Administered 2021-01-06: 1 mg via ORAL
  Filled 2021-01-06: qty 1

## 2021-01-06 MED ORDER — INSULIN ASPART 100 UNIT/ML ~~LOC~~ SOLN
0.0000 [IU] | Freq: Three times a day (TID) | SUBCUTANEOUS | Status: DC
  Administered 2021-01-07: 3 [IU] via SUBCUTANEOUS
  Administered 2021-01-07: 2 [IU] via SUBCUTANEOUS

## 2021-01-06 MED ORDER — NITROGLYCERIN 0.4 MG SL SUBL: 0.4000 mg | SUBLINGUAL_TABLET | SUBLINGUAL | Status: DC | PRN

## 2021-01-06 MED ORDER — ACETAMINOPHEN 650 MG RE SUPP: 650.0000 mg | Freq: Four times a day (QID) | RECTAL | Status: DC | PRN

## 2021-01-06 MED ORDER — DOCUSATE SODIUM 100 MG PO CAPS
100.0000 mg | ORAL_CAPSULE | Freq: Every day | ORAL | Status: DC
  Administered 2021-01-06 – 2021-01-07 (×2): 100 mg via ORAL
  Filled 2021-01-06 (×2): qty 1

## 2021-01-06 MED ORDER — ACETAMINOPHEN 325 MG PO TABS
650.0000 mg | ORAL_TABLET | Freq: Four times a day (QID) | ORAL | Status: DC | PRN
  Administered 2021-01-06: 650 mg via ORAL
  Filled 2021-01-06: qty 2

## 2021-01-06 MED ORDER — INSULIN ASPART 100 UNIT/ML ~~LOC~~ SOLN: 0.0000 [IU] | Freq: Every day | SUBCUTANEOUS | Status: DC

## 2021-01-06 MED ORDER — ISOSORBIDE MONONITRATE ER 60 MG PO TB24
30.0000 mg | ORAL_TABLET | Freq: Every day | ORAL | Status: DC
  Administered 2021-01-06 – 2021-01-07 (×2): 30 mg via ORAL
  Filled 2021-01-06 (×2): qty 1

## 2021-01-06 MED ORDER — ZOLPIDEM TARTRATE 5 MG PO TABS
5.0000 mg | ORAL_TABLET | Freq: Every evening | ORAL | Status: DC | PRN
  Administered 2021-01-06: 5 mg via ORAL
  Filled 2021-01-06: qty 1

## 2021-01-06 MED ORDER — MOMETASONE FURO-FORMOTEROL FUM 200-5 MCG/ACT IN AERO
2.0000 | INHALATION_SPRAY | Freq: Two times a day (BID) | RESPIRATORY_TRACT | Status: DC
  Administered 2021-01-06 – 2021-01-07 (×2): 2 via RESPIRATORY_TRACT
  Filled 2021-01-06: qty 8.8

## 2021-01-06 MED ORDER — ATORVASTATIN CALCIUM 40 MG PO TABS
40.0000 mg | ORAL_TABLET | Freq: Every day | ORAL | Status: DC
  Administered 2021-01-06: 40 mg via ORAL
  Filled 2021-01-06: qty 1

## 2021-01-06 MED ORDER — SODIUM CHLORIDE 0.9% FLUSH: 3.0000 mL | INTRAVENOUS | Status: DC | PRN

## 2021-01-06 MED ORDER — ONDANSETRON HCL 4 MG/2ML IJ SOLN: 4.0000 mg | Freq: Four times a day (QID) | INTRAMUSCULAR | Status: DC | PRN

## 2021-01-06 MED ORDER — PANTOPRAZOLE SODIUM 40 MG PO TBEC
40.0000 mg | DELAYED_RELEASE_TABLET | Freq: Two times a day (BID) | ORAL | Status: DC
  Administered 2021-01-06 – 2021-01-07 (×2): 40 mg via ORAL
  Filled 2021-01-06 (×2): qty 1

## 2021-01-06 MED ORDER — POLYSACCHARIDE IRON COMPLEX 150 MG PO CAPS
150.0000 mg | ORAL_CAPSULE | Freq: Every day | ORAL | Status: DC
  Administered 2021-01-06 – 2021-01-07 (×2): 150 mg via ORAL
  Filled 2021-01-06 (×2): qty 1

## 2021-01-06 MED ORDER — ONDANSETRON HCL 4 MG PO TABS: 4.0000 mg | ORAL_TABLET | Freq: Four times a day (QID) | ORAL | Status: DC | PRN

## 2021-01-06 MED ORDER — NICOTINE 14 MG/24HR TD PT24
14.0000 mg | MEDICATED_PATCH | Freq: Every day | TRANSDERMAL | Status: DC
  Administered 2021-01-06 – 2021-01-07 (×2): 14 mg via TRANSDERMAL
  Filled 2021-01-06 (×2): qty 1

## 2021-01-06 MED ORDER — FUROSEMIDE 10 MG/ML IJ SOLN
40.0000 mg | Freq: Once | INTRAMUSCULAR | Status: AC
  Administered 2021-01-06: 40 mg via INTRAVENOUS
  Filled 2021-01-06: qty 4

## 2021-01-06 MED ORDER — SPIRONOLACTONE 25 MG PO TABS
25.0000 mg | ORAL_TABLET | Freq: Every day | ORAL | Status: DC
  Administered 2021-01-06 – 2021-01-07 (×2): 25 mg via ORAL
  Filled 2021-01-06 (×2): qty 1

## 2021-01-06 MED ORDER — SODIUM CHLORIDE 0.9% IV SOLUTION
Freq: Once | INTRAVENOUS | Status: DC
Start: 2021-01-06 — End: 2021-01-07

## 2021-01-06 MED ORDER — ASPIRIN EC 81 MG PO TBEC
81.0000 mg | DELAYED_RELEASE_TABLET | Freq: Every day | ORAL | Status: DC
  Administered 2021-01-06 – 2021-01-07 (×2): 81 mg via ORAL
  Filled 2021-01-06 (×2): qty 1

## 2021-01-06 MED ORDER — PRASUGREL HCL 10 MG PO TABS
10.0000 mg | ORAL_TABLET | Freq: Every day | ORAL | Status: DC
  Administered 2021-01-06 – 2021-01-07 (×2): 10 mg via ORAL
  Filled 2021-01-06 (×4): qty 1

## 2021-01-06 MED ORDER — TIOTROPIUM BROMIDE MONOHYDRATE 18 MCG IN CAPS
1.0000 | ORAL_CAPSULE | Freq: Every day | RESPIRATORY_TRACT | Status: DC
  Filled 2021-01-06: qty 5

## 2021-01-06 MED ORDER — GABAPENTIN 300 MG PO CAPS
300.0000 mg | ORAL_CAPSULE | Freq: Every day | ORAL | Status: DC
  Administered 2021-01-06: 300 mg via ORAL
  Filled 2021-01-06: qty 1

## 2021-01-06 MED ORDER — SODIUM CHLORIDE 0.9% FLUSH
3.0000 mL | Freq: Two times a day (BID) | INTRAVENOUS | Status: DC
  Administered 2021-01-06: 3 mL via INTRAVENOUS

## 2021-01-06 MED ORDER — SODIUM CHLORIDE 0.9 % IV SOLN
510.0000 mg | Freq: Once | INTRAVENOUS | Status: AC
  Administered 2021-01-06: 510 mg via INTRAVENOUS
  Filled 2021-01-06: qty 17

## 2021-01-06 MED ORDER — SODIUM CHLORIDE 0.9 % IV SOLN: 250.0000 mL | INTRAVENOUS | Status: DC | PRN

## 2021-01-06 MED ORDER — TRAMADOL HCL 50 MG PO TABS: 50.0000 mg | ORAL_TABLET | Freq: Two times a day (BID) | ORAL | Status: DC | PRN

## 2021-01-06 NOTE — ED Triage Notes (Signed)
Pt c/o abnormal bloodwork that was taken Friday by his PCP. Pt went in for a routine checkup and then was called Saturday and told to come to the ED for blood transfusion because his hemoglobin level was around 6. Pt c/o generalized weakness.

## 2021-01-06 NOTE — Progress Notes (Signed)
Discharge Progress Report  Patient Details  Name: Gregory Alvarado MRN: [id number] Date of Birth: 03-18-1951 Referring Provider:   Flowsheet Row CARDIAC REHAB PHASE II ORIENTATION from 11/14/2020 in [place]  Referring Provider Dr. Harl Bowie        Number of Visits: 9   Reason for Discharge:  Early Exit:  Personal  Smoking History:  Social History   Tobacco Use  Smoking Status Current Every Day Smoker  . Packs/day: 2.00  . Types: Cigarettes  Smokeless Tobacco Never Used    Diagnosis:  S/P coronary artery stent placement  NSTEMI (non-ST elevated myocardial infarction) ([place])  ADL [facility]:   Initial Exercise Prescription:  Initial Exercise Prescription - 11/14/20 1000      Date of Initial Exercise RX and Referring Provider   Date 11/14/20    Referring Provider Dr. Harl Bowie    Expected Discharge Date 02/05/21      NuStep   Level 1    SPM 60    Minutes 39      Prescription Details   Frequency (times per week) 3    Duration Progress to 30 minutes of continuous aerobic without signs/symptoms of physical distress      Intensity   THRR 40-80% of Max Heartrate 61-122    Ratings of Perceived Exertion 11-13      Progression   Progression Continue progressive overload as per policy without signs/symptoms or physical distress.      Resistance Training   Training Prescription Yes    Weight 3 lbs    Reps 10-15           Discharge Exercise Prescription (Final Exercise Prescription Changes):  Exercise Prescription Changes - 12/11/20 1200      Response to Exercise   Blood Pressure (Admit) 124/50    Blood Pressure (Exercise) 132/56    Blood Pressure (Exit) 108/46    Heart Rate (Admit) 76 bpm    Heart Rate (Exercise) 106 bpm    Heart Rate (Exit) 85 bpm    Rating of Perceived Exertion (Exercise) 12    Duration Continue with 30 min of aerobic exercise without signs/symptoms of physical distress.    Intensity THRR unchanged      Progression    Progression Continue to progress workloads to maintain intensity without signs/symptoms of physical distress.      Resistance Training   Training Prescription Yes    Weight 3 lbs    Reps 10-15    Time 10 Minutes      NuStep   Level 1    SPM 81    Minutes 39    METs 1.9           Functional Capacity:  6 Minute Walk    Row Name 11/14/20 1026         6 Minute Walk   Phase Initial     Distance 500 feet     Walk Time 6 minutes     # of Rest Breaks 1  took a 3 min break     MPH 0.9     METS 0.81     RPE 9     VO2 Peak 2.84     Symptoms Yes (comment)     Comments pain in quads 7/10     Resting HR 76 bpm     Resting BP 108/52     Resting Oxygen Saturation  98 %     Exercise Oxygen Saturation  during 6 min walk 98 %  Max Ex. HR 93 bpm     Max Ex. BP 122/48     2 Minute Post BP 102/48            Psychological, QOL, Others - Outcomes: PHQ 2/9: Depression screen [facility] 2/9 11/14/2020 08/06/2020 08/02/2020 06/09/2017 02/24/2017  Decreased Interest 0 0 0 0 0  Down, Depressed, Hopeless 0 0 0 0 0  PHQ - 2 Score 0 0 0 0 0  Altered sleeping 2 - - - -  Tired, decreased energy 3 - - - -  Change in appetite 0 - - - -  Feeling bad or failure about yourself  2 - - - -  Trouble concentrating 0 - - - -  Moving slowly or fidgety/restless 0 - - - -  Suicidal thoughts 0 - - - -  PHQ-9 Score 7 - - - -  Difficult doing work/chores Somewhat difficult - - - -    Quality of Life:  Quality of Life - 11/14/20 1031      Quality of Life   Select Quality of Life      Quality of Life Scores   Health/Function Pre 15.4 %    Socioeconomic Pre 20.92 %    Psych/Spiritual Pre 9.43 %    Family Pre 14.8 %    GLOBAL Pre 15.05 %           Personal Goals: Goals established at orientation with interventions provided to work toward goal.  Personal Goals and Risk Factors at Admission - 11/14/20 0846      Core Components/Risk Factors/Patient Goals on Admission    Weight Management  Yes;Weight Loss    Expected Outcomes Short Term: Continue to assess and modify interventions until short term weight is achieved;Long Term: Adherence to nutrition and physical activity/exercise program aimed toward attainment of established weight goal;Weight Loss: Understanding of general recommendations for a balanced deficit meal plan, which promotes 1-2 lb weight loss per week and includes a negative energy balance of [phone] kcal/d;Understanding recommendations for meals to include 15-35% energy as protein, 25-35% energy from fat, 35-60% energy from carbohydrates, less than 200mg  of dietary cholesterol, 20-35 gm of total fiber daily;Understanding of distribution of calorie intake throughout the day with the consumption of 4-5 meals/snacks    Tobacco Cessation Yes    Number of packs per day 2 packs per day    Intervention Assist the participant in steps to quit. Provide individualized education and counseling about committing to Tobacco Cessation, relapse prevention, and pharmacological support that can be provided by physician.;Advice worker, assist with locating and accessing local/national Quit Smoking programs, and support quit date choice.    Expected Outcomes Short Term: Will demonstrate readiness to quit, by selecting a quit date.;Short Term: Will quit all tobacco product use, adhering to prevention of relapse plan.;Long Term: Complete abstinence from all tobacco products for at least 12 months from quit date.    Improve shortness of breath with ADL's Yes    Intervention Provide education, individualized exercise plan and daily activity instruction to help decrease symptoms of SOB with activities of daily living.    Expected Outcomes Short Term: Improve cardiorespiratory fitness to achieve a reduction of symptoms when performing ADLs;Long Term: Be able to perform more ADLs without symptoms or delay the onset of symptoms    Diabetes Yes    Intervention Provide education about  signs/symptoms and action to take for hypo/hyperglycemia.;Provide education about proper nutrition, including hydration, and aerobic/resistive exercise prescription along with prescribed medications  to achieve blood glucose in normal ranges: Fasting glucose 65-99 mg/dL    Expected Outcomes Short Term: Participant verbalizes understanding of the signs/symptoms and immediate care of hyper/hypoglycemia, proper foot care and importance of medication, aerobic/resistive exercise and nutrition plan for blood glucose control.;Long Term: Attainment of HbA1C < 7%.    Hypertension Yes    Intervention Provide education on lifestyle modifcations including regular physical activity/exercise, weight management, moderate sodium restriction and increased consumption of fresh fruit, vegetables, and low fat dairy, alcohol moderation, and smoking cessation.;Monitor prescription use compliance.    Expected Outcomes Short Term: Continued assessment and intervention until BP is < 140/1mm HG in hypertensive participants. < 130/23mm HG in hypertensive participants with diabetes, heart failure or chronic kidney disease.;Long Term: Maintenance of blood pressure at goal levels.            Personal Goals Discharge:  Goals and Risk Factor Review    Row Name 11/18/20 0856 12/11/20 0815           Core Components/Risk Factors/Patient Goals Review   Personal Goals Review Weight Management/Obesity;Tobacco Cessation Weight Management/Obesity;Tobacco Cessation;Diabetes      Review Patient referred to cardiac rehab with NSTEMI and stent placement. He has multiple risk factors for CAD and is participating in the program for risk modification. His personal goals for the program are to quit smoking and get stronger. We will monitor his progress as he works toward meeting these goals. Patient has completed 8 sessions gaining 2 lbs since his initial visit. He is doing ok in the program. He only does the nustep and does complain about his  legs feeling weak. He is very decontitioned and continues to smoke 2ppd.  He saw his cardiologist Dr Harl Bowie 3/22 and complained of chest pain with exertion but has no complained in CR. He increased his Metoprolol to 150 mg daily. Patient's PCP has also made some adjustments in his insulin dosage after staff notifited him of  hypoglycemic epidoses at night and him not being able to exercise due to being lesss than 110mg /dl. His personal goals for the program are to quit smoking and get stronger. We will continue to monitor his progress as he works toward meeting these goals.      Expected Outcomes Patient will complete the program meeting both personal and program goals. Patient will complete the program meeting both personal and program goals.             Exercise Goals and Review:  Exercise Goals    Row Name 11/14/20 1029 11/18/20 1142 12/11/20 0825         Exercise Goals   Increase Physical Activity Yes Yes Yes     Intervention Provide advice, education, support and counseling about physical activity/exercise needs.;Develop an individualized exercise prescription for aerobic and resistive training based on initial evaluation findings, risk stratification, comorbidities and participant's personal goals. Provide advice, education, support and counseling about physical activity/exercise needs.;Develop an individualized exercise prescription for aerobic and resistive training based on initial evaluation findings, risk stratification, comorbidities and participant's personal goals. Provide advice, education, support and counseling about physical activity/exercise needs.;Develop an individualized exercise prescription for aerobic and resistive training based on initial evaluation findings, risk stratification, comorbidities and participant's personal goals.     Expected Outcomes Short Term: Attend rehab on a regular basis to increase amount of physical activity.;Long Term: Add in home exercise to make  exercise part of routine and to increase amount of physical activity.;Long Term: Exercising regularly at least 3-5 days  a week. Short Term: Attend rehab on a regular basis to increase amount of physical activity.;Long Term: Add in home exercise to make exercise part of routine and to increase amount of physical activity.;Long Term: Exercising regularly at least 3-5 days a week. Short Term: Attend rehab on a regular basis to increase amount of physical activity.;Long Term: Add in home exercise to make exercise part of routine and to increase amount of physical activity.;Long Term: Exercising regularly at least 3-5 days a week.     Increase Strength and Stamina Yes Yes Yes     Intervention Provide advice, education, support and counseling about physical activity/exercise needs.;Develop an individualized exercise prescription for aerobic and resistive training based on initial evaluation findings, risk stratification, comorbidities and participant's personal goals. Provide advice, education, support and counseling about physical activity/exercise needs.;Develop an individualized exercise prescription for aerobic and resistive training based on initial evaluation findings, risk stratification, comorbidities and participant's personal goals. Provide advice, education, support and counseling about physical activity/exercise needs.;Develop an individualized exercise prescription for aerobic and resistive training based on initial evaluation findings, risk stratification, comorbidities and participant's personal goals.     Expected Outcomes Short Term: Increase workloads from initial exercise prescription for resistance, speed, and METs.;Short Term: Perform resistance training exercises routinely during rehab and add in resistance training at home;Long Term: Improve cardiorespiratory fitness, muscular endurance and strength as measured by increased METs and functional capacity (6MWT) Short Term: Increase workloads from  initial exercise prescription for resistance, speed, and METs.;Short Term: Perform resistance training exercises routinely during rehab and add in resistance training at home;Long Term: Improve cardiorespiratory fitness, muscular endurance and strength as measured by increased METs and functional capacity (6MWT) Short Term: Increase workloads from initial exercise prescription for resistance, speed, and METs.;Short Term: Perform resistance training exercises routinely during rehab and add in resistance training at home;Long Term: Improve cardiorespiratory fitness, muscular endurance and strength as measured by increased METs and functional capacity (6MWT)     Able to understand and use rate of perceived exertion (RPE) scale Yes Yes Yes     Intervention Provide education and explanation on how to use RPE scale Provide education and explanation on how to use RPE scale Provide education and explanation on how to use RPE scale     Expected Outcomes Short Term: Able to use RPE daily in rehab to express subjective intensity level;Long Term:  Able to use RPE to guide intensity level when exercising independently Short Term: Able to use RPE daily in rehab to express subjective intensity level;Long Term:  Able to use RPE to guide intensity level when exercising independently Short Term: Able to use RPE daily in rehab to express subjective intensity level;Long Term:  Able to use RPE to guide intensity level when exercising independently     Knowledge and understanding of Target Heart Rate Range (THRR) Yes Yes Yes     Intervention Provide education and explanation of THRR including how the numbers were predicted and where they are located for reference Provide education and explanation of THRR including how the numbers were predicted and where they are located for reference Provide education and explanation of THRR including how the numbers were predicted and where they are located for reference     Expected Outcomes  Long Term: Able to use THRR to govern intensity when exercising independently;Short Term: Able to state/look up THRR;Short Term: Able to use daily as guideline for intensity in rehab Long Term: Able to use THRR to govern intensity when exercising  independently;Short Term: Able to state/look up THRR;Short Term: Able to use daily as guideline for intensity in rehab Long Term: Able to use THRR to govern intensity when exercising independently;Short Term: Able to state/look up THRR;Short Term: Able to use daily as guideline for intensity in rehab     Able to check pulse independently Yes Yes Yes     Intervention Provide education and demonstration on how to check pulse in carotid and radial arteries.;Review the importance of being able to check your own pulse for safety during independent exercise Provide education and demonstration on how to check pulse in carotid and radial arteries.;Review the importance of being able to check your own pulse for safety during independent exercise Provide education and demonstration on how to check pulse in carotid and radial arteries.;Review the importance of being able to check your own pulse for safety during independent exercise     Expected Outcomes Short Term: Able to explain why pulse checking is important during independent exercise;Long Term: Able to check pulse independently and accurately Short Term: Able to explain why pulse checking is important during independent exercise;Long Term: Able to check pulse independently and accurately Short Term: Able to explain why pulse checking is important during independent exercise;Long Term: Able to check pulse independently and accurately     Understanding of Exercise Prescription Yes Yes Yes     Intervention Provide education, explanation, and written materials on patient's individual exercise prescription Provide education, explanation, and written materials on patient's individual exercise prescription Provide education,  explanation, and written materials on patient's individual exercise prescription     Expected Outcomes Short Term: Able to explain program exercise prescription;Long Term: Able to explain home exercise prescription to exercise independently Short Term: Able to explain program exercise prescription;Long Term: Able to explain home exercise prescription to exercise independently Short Term: Able to explain program exercise prescription;Long Term: Able to explain home exercise prescription to exercise independently            Exercise Goals Re-Evaluation:  Exercise Goals Re-Evaluation    [place] Name 11/18/20 1142 12/11/20 1200           Exercise Goal Re-Evaluation   Exercise Goals Review Increase Physical Activity;Increase Strength and Stamina;Able to understand and use rate of perceived exertion (RPE) scale;Knowledge and understanding of Target Heart Rate Range (THRR);Able to check pulse independently;Understanding of Exercise Prescription Increase Physical Activity;Increase Strength and Stamina;Able to understand and use rate of perceived exertion (RPE) scale;Knowledge and understanding of Target Heart Rate Range (THRR);Able to check pulse independently;Understanding of Exercise Prescription      Comments Pt was set to begin exercise on 11/18/2020, but patient arrived at the wrong time and became argumentative with staff. Pt states he will return 11/20/2020. Patient has completed 8 exercise sessions. He has tolerated exercise well for the most part. He takes frequent breaks to catch his breath and to give his legs a rest. He has taken less breaks since the beginning of the program. He is very deconditioned. He has to take breaks during the warm up and strength training. Most days he sits down to complete the warm up and resistance training. He is currently exercising at 1.9 METs on the NuStep. Will continue to monitor and progress as able.      Expected Outcomes Through exercise in the cardiac rehab program  and by engaging in a home exercise program, the patient will get stronger and reach their goals. Through exercise in the cardiac rehab program and by engaging in  a home exercise program, the patient will get stronger and reach their goals.             Nutrition & Weight - Outcomes:  Pre Biometrics - 12/02/20 1032      Pre Biometrics   Weight 130.3 kg    BMI (Calculated) 38.95            Nutrition:  Nutrition Therapy & Goals - 11/18/20 0855      Personal Nutrition Goals   Comments Patient scored 110 on his diet assessment. We have 2 educational sessions on heart healthy nutrition with handouts. We also offer our RD services here at [facility]. Patient declined.      Intervention Plan   Intervention Nutrition handout(s) given to patient.           Nutrition Discharge:  Nutrition Assessments - 11/14/20 0836      MEDFICTS Scores   Pre Score 110           Education Questionnaire Score:  Knowledge Questionnaire Score - 11/14/20 0841      Knowledge Questionnaire Score   Pre Score 23/28           Goals reviewed with patient; copy given to patient.

## 2021-01-06 NOTE — Telephone Encounter (Signed)
Erline Levine,  This patient is currently admitted with acute on chronic anemia. We need to have him seen in follow-up in 2 weeks in office. May use an urgent. I have seen him previously and [place].   Dena: needs CBC either Thursday or Friday as outpatient.

## 2021-01-06 NOTE — Addendum Note (Signed)
Encounter addended by: Lorin Glass on: 01/06/2021 3:10 PM  Actions taken: Clinical Note Signed

## 2021-01-06 NOTE — Telephone Encounter (Signed)
noted 

## 2021-01-06 NOTE — Telephone Encounter (Signed)
LABS FAXED TO [facility]

## 2021-01-06 NOTE — ED Provider Notes (Signed)
[facility] EMERGENCY DEPARTMENT Provider Note   CSN: [id number] Arrival date & time: 01/06/21  0915     History Chief Complaint  Patient presents with  . Abnormal Lab    Gregory Alvarado is a 70 y.o. male presented for evaluation of generalized weakness and abnormal blood counts.  Patient states he has recurrent issues with GI bleed and subsequent anemia requiring transfusion.  This is the third time.  He reports gradually worsening weakness over the past month.  He reports melanotic stool for 2 years, no change recently.  He denies excessive pallor.  He saw his PCP for regular checkup last week, labs returned today and showed anemia of 6 new sent to the ER for transfusion.  He has had transfusions previously without issue.  He denies fevers, chills, chest pain, shortness of breath, cough, nausea, vomiting, abdominal pain, normal bowel movements.  He reports normal urination, but states he continues to have swelling. Patient states he is on baby aspirin, no other blood thinners.  He reports he is taking iron pills every day.  Additional history obtained per chart review.  History of COPD, CAD, CHF, diabetes, hypertension, GI bleed.   He follows with [facility].  Initial GI bleed was in February 2021, recurring in August 2022 and November 2021.  Initial imaging showed cratered gastric ulcers and gastritis.  Second EGD showed H. pylori and healed gastric ulcers unfortunately colonoscopy was limited due to poor prep.  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  Has not followed up with GI since.  He has seen heme-onc (Dr. Delton Coombes) as well for recurrent anemia.   HPI     Past Medical History:  Diagnosis Date  . Anemia   . Arthritis    left shoulder  . COPD (chronic obstructive pulmonary disease) ([place])   . Coronary artery disease 09/11/2020   NSTEMI and 3 stents in [facility]  . Diabetes mellitus without  complication ([place])   . Dyslipidemia   . Hypertension   . Insomnia   . Insomnia   . Morbid obesity ([place])   . Tobacco use     Patient Active Problem List   Diagnosis Date Noted  . Acute anemia 01/06/2021  . SBO (small bowel obstruction) ([place])   . Guaiac positive stools 07/22/2020  . Folate deficiency 07/22/2020  . History of colonic polyps 07/16/2020  . Chronic diastolic CHF (congestive heart failure) ([place]) 07/09/2020  . Exertional angina ([place]) 07/09/2020  . CRI (chronic renal insufficiency), stage 2 (mild) 07/09/2020  . Colon cancer screening 01/23/2020  . PUD (peptic ulcer disease) 01/23/2020  . Esophageal dysphagia 01/23/2020  . H. pylori infection 01/23/2020  . GI bleed 10/27/2019  . Rectal bleeding   . Symptomatic anemia 10/26/2019  . Obesity, Class III, BMI 40-49.9 (morbid obesity) ([place]) 10/26/2019  . Iron deficiency anemia   . Melena   . Constipation   . Personal history of noncompliance with medical treatment, presenting hazards to health 02/24/2017  . Class 2 obesity due to excess calories with body mass index (BMI) of 39.0 to 39.9 in adult 01/05/2017  . Current smoker 01/05/2017  . Essential hypertension   . Insulin dependent type 2 diabetes mellitus ([place])   . COPD (chronic obstructive pulmonary disease) ([place])   . Arthritis   . Hyperlipidemia   . Insomnia     Past Surgical History:  Procedure Laterality Date  . BIOPSY  10/28/2019  Procedure: BIOPSY;  Surgeon: Danie Binder, MD;  Location: [facility];  Service: Endoscopy;;  gastric  . BIOPSY  04/15/2020   Procedure: BIOPSY;  Surgeon: Daneil Dolin, MD;  Location: [facility];  Service: Endoscopy;;  gastric  . CARDIAC CATHETERIZATION  08/2020   3 stents placed  . COLON RESECTION  1990s?   DIVERTICULITIS  . COLONOSCOPY WITH PROPOFOL N/A 04/15/2020   Rourk: Poor colon prep. two 6 to 8 mm polyps removed from the hepatic flexure, tubular adenomas.  Repeat colonoscopy later this year.  .  ESOPHAGOGASTRODUODENOSCOPY (EGD) WITH PROPOFOL N/A 10/28/2019   Dr. Oneida Alar: Multiple cratered gastric ulcers, gastritis/duodenitis.  Path showed H. pylori.  Patient treated with amoxicillin/Biaxin/PPI twice daily.  . ESOPHAGOGASTRODUODENOSCOPY (EGD) WITH PROPOFOL N/A 04/15/2020   Rourk: Patchy gastric erythema status post biopsy to document eradication of H. pylori.  Biopsy showed persistent H. pylori.  Previously noted gastric ulcer is completely healed.  . 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  Marland Kitchen HERNIA REPAIR     UHR  . MALONEY DILATION N/A 04/15/2020   Procedure: Venia Minks DILATION;  Surgeon: Daneil Dolin, MD;  Location: [facility];  Service: Endoscopy;  Laterality: N/A;  . POLYPECTOMY  04/15/2020   Procedure: POLYPECTOMY;  Surgeon: Daneil Dolin, MD;  Location: [facility];  Service: Endoscopy;;  colon  . ROTATOR CUFF REPAIR Bilateral   . TYMPANOPLASTY         Family History  Problem Relation Age of Onset  . Diabetes Mother   . Heart attack Mother   . Heart attack Father   . Heart attack Brother   . Colon cancer Neg Hx   . Stomach cancer Neg Hx     Social History   Tobacco Use  . Smoking status: Current Every Day Smoker    Packs/day: 2.00    Types: Cigarettes  . Smokeless tobacco: Never Used  Vaping Use  . Vaping Use: Never used  Substance Use Topics  . Alcohol use: Yes    Comment: very rare  . Drug use: No    Home Medications Prior to Admission medications   Medication Sig Start Date End Date Taking? Authorizing Provider  aspirin EC 81 MG tablet Take 81 mg by mouth daily. Swallow whole.   Yes [provider]  atorvastatin (LIPITOR) 40 MG tablet Take 40 mg by mouth at bedtime. 08/30/19  Yes [provider]  cyanocobalamin (,VITAMIN B-12,) 1000 MCG/ML injection Inject 1,000 mcg into the muscle every 30 (thirty) days.    Yes [provider]  docusate sodium (COLACE) 100 MG capsule Take 1 capsule (100 mg total) by mouth daily. 07/30/20  Yes Barton Dubois, MD  FERREX 150 150 MG capsule Take 150 mg by mouth daily. 12/04/20  Yes [provider]  folic acid (FOLVITE) 1 MG tablet Take 1 tablet (1 mg total) by mouth daily. Patient taking differently: Take 1 mg by mouth at bedtime. 10/30/19  Yes Emokpae, Courage, MD  furosemide (LASIX) 40 MG tablet Take 1 tablet (40 mg total) by mouth daily as needed. 10/03/20 01/01/21 Yes BranchAlphonse Guild, MD  gabapentin (NEURONTIN) 300 MG capsule Take 1 capsule by mouth at bedtime. 12/30/20  Yes [provider]  hydrochlorothiazide (HYDRODIURIL) 12.5 MG tablet Take 12.5 mg by mouth at bedtime.  01/23/20  Yes [provider]  insulin NPH-regular Human (NOVOLIN 70/30) (70-30) 100  UNIT/ML injection Inject 40 Units into the skin in the morning and at bedtime. Inject 40 units in morning and 40 units every evening   Yes [provider]  lisinopril (PRINIVIL,ZESTRIL) 20 MG tablet Take 1 tablet (20 mg total) by mouth daily. 06/09/17  Yes Nida, Marella Chimes, MD  metoprolol succinate (TOPROL XL) 100 MG 24 hr tablet Take 1.5 tablets (150 mg total) by mouth daily. Take with or immediately following a meal. 12/03/20  Yes Branch, Alphonse Guild, MD  naproxen (NAPROSYN) 500 MG tablet Take 500 mg by mouth 2 (two) times daily. 08/20/20  Yes [provider]  nitroGLYCERIN (NITROSTAT) 0.4 MG SL tablet DISSOLVE 1 TABLET SUBLINGUALLY AS NEEDED FOR CHEST PAIN, MAY REPEAT EVERY 5 MINUTES. AFTER 3 CALL 911. 01/06/21  Yes Branch, Alphonse Guild, MD  pantoprazole (PROTONIX) 40 MG tablet TAKE ONE TABLET BY MOUTH 2 TIMES A DAY BEFORE A MEAL 12/10/20  Yes Aliene Altes S, PA-C  prasugrel (EFFIENT) 10 MG TABS tablet Take 10 mg by mouth daily. 09/24/20  Yes [provider]  SPIRIVA HANDIHALER 18 MCG inhalation capsule Place 1 capsule into inhaler and inhale daily. 09/24/20  Yes  [provider]  spironolactone (ALDACTONE) 25 MG tablet TAKE 1 TABLET BY MOUTH ONCE A DAY. 10/28/20  Yes Branch, Alphonse Guild, MD  SUMAtriptan (IMITREX) 25 MG tablet Take 25 mg by mouth every 2 (two) hours as needed. 10/10/20  Yes [provider]  SYMBICORT 160-4.5 MCG/ACT inhaler Inhale 2 puffs into the lungs 2 (two) times daily. 09/24/20  Yes [provider]  traMADol (ULTRAM) 50 MG tablet Take 50-100 mg by mouth in the morning and at bedtime. 09/20/19  Yes [provider]  zolpidem (AMBIEN) 5 MG tablet Take 5 mg by mouth at bedtime as needed. 09/26/20  Yes [provider]  acetaminophen (TYLENOL) 325 MG tablet Take 2 tablets (650 mg total) by mouth every 6 (six) hours as needed for mild pain (or Fever >/= 101). 10/29/19   Roxan Hockey, MD  isosorbide mononitrate (IMDUR) 30 MG 24 hr tablet Take 1 tablet (30 mg total) by mouth daily. May take 2 if needed 07/09/20 10/07/20  Erlene Quan, PA-C  polyethylene glycol (MIRALAX / GLYCOLAX) 17 g packet Take 17 g by mouth daily. Patient not taking: No sig reported 07/30/20   Barton Dubois, MD    Allergies    Caduet [amlodipine-atorvastatin]  Review of Systems   Review of Systems  Gastrointestinal: Positive for blood in stool.  Neurological: Positive for weakness.  All other systems reviewed and are negative.   Physical Exam Updated Vital Signs BP (!) 118/52   Pulse 77   Temp 97.9 F (36.6 C) (Oral)   Resp 17   Ht 6' (1.829 m)   Wt 129.3 kg   SpO2 94%   BMI 38.65 kg/m   Physical Exam Vitals and nursing note reviewed. Exam conducted with a chaperone present.  Constitutional:      General: He is not in acute distress.    Appearance: He is well-developed. He is obese.     Comments: Appears chronically ill.  HENT:     Head: Normocephalic and atraumatic.  Eyes:     Extraocular Movements: Extraocular movements intact.     Conjunctiva/sclera: Conjunctivae normal.     Pupils: Pupils are equal,  round, and reactive to light.  Cardiovascular:     Rate and Rhythm: Normal rate and regular rhythm.     Pulses: Normal pulses.  Pulmonary:  Effort: Pulmonary effort is normal. No respiratory distress.     Breath sounds: Normal breath sounds. No wheezing.  Abdominal:     General: There is no distension.     Palpations: Abdomen is soft. There is no mass.     Tenderness: There is no abdominal tenderness. There is no guarding or rebound.  Genitourinary:    Rectum: No mass or tenderness.     Comments: Dark stool, but not fully melanotic.  No active bleeding.  No mass.  Hemoccult negative.   Small, dime size pressure ulcer, stage 2 noted of the r buttock. No erythema, ttp, or drainage.   Judye Bos, RN, present as Producer, television/film/video.  Musculoskeletal:        General: Normal range of motion.     Cervical back: Normal range of motion and neck supple.     Right lower leg: Edema present.     Left lower leg: Edema present.     Comments: 1-2+ pitting edema of bilateral lower extremities.  Skin:    General: Skin is warm and dry.     Capillary Refill: Capillary refill takes less than 2 seconds.  Neurological:     Mental Status: He is alert and oriented to person, place, and time.     ED Results / Procedures / Treatments   Labs (all labs ordered are listed, but only abnormal results are displayed) Labs Reviewed  CBC WITH DIFFERENTIAL/PLATELET - Abnormal; Notable for the following components:      Result Value   WBC 12.2 (*)    RBC 3.37 (*)    Hemoglobin 6.5 (*)    HCT 25.4 (*)    MCV 75.4 (*)    MCH 19.3 (*)    MCHC 25.6 (*)    RDW 20.9 (*)    Neutro Abs 10.4 (*)    Abs Immature Granulocytes 0.08 (*)    All other components within normal limits  COMPREHENSIVE METABOLIC PANEL - Abnormal; Notable for the following components:   Glucose, Bld 192 (*)    Creatinine, Ser 1.31 (*)    Calcium 8.6 (*)    Albumin 3.3 (*)    AST 11 (*)    GFR, Estimated 60 (*)    All other components within  normal limits  BRAIN NATRIURETIC PEPTIDE - Abnormal; Notable for the following components:   B Natriuretic Peptide 336.0 (*)    All other components within normal limits  RESP PANEL BY RT-PCR (FLU A&B, COVID) ARPGX2  VITAMIN B12  FOLATE  IRON AND TIBC  FERRITIN  RETICULOCYTES  LACTATE DEHYDROGENASE  HAPTOGLOBIN  PATHOLOGIST SMEAR REVIEW  POC OCCULT BLOOD, ED  TYPE AND SCREEN  PREPARE RBC (CROSSMATCH)    EKG EKG Interpretation  Date/Time:  Monday January 06 2021 10:23:01 EDT Ventricular Rate:  71 PR Interval:  188 QRS Duration: 114 QT Interval:  434 QTC Calculation: 472 R Axis:   84 Text Interpretation: Sinus rhythm Borderline intraventricular conduction delay Low voltage with right axis deviation Nonspecific T abnormalities, lateral leads Similar to prior Confirmed by Nanda Quinton [phone]) on 01/06/2021 11:37:01 AM   Radiology DG Chest 2 View  Result Date: 01/06/2021 CLINICAL DATA:  Weakness. EXAM: CHEST - 2 VIEW COMPARISON:  July 22, 2020. FINDINGS: The heart size and mediastinal contours are within normal limits. Both lungs are clear. The visualized skeletal structures are unremarkable. IMPRESSION: No active cardiopulmonary disease. Electronically Signed   By: Marijo Conception M.D.   On: 01/06/2021 10:59    Procedures .Critical Care  Performed by: Franchot Heidelberg, PA-C Authorized by: Franchot Heidelberg, PA-C   Critical care provider statement:    Critical care time (minutes):  35   Critical care time was exclusive of:  Separately billable procedures and treating other patients and teaching time   Critical care was necessary to treat or prevent imminent or life-threatening deterioration of the following conditions:  Circulatory failure   Critical care was time spent personally by me on the following activities:  Blood draw for specimens, development of treatment plan with patient or surrogate, evaluation of patient's response to treatment, examination of patient,  obtaining history from patient or surrogate, ordering and performing treatments and interventions, ordering and review of laboratory studies, ordering and review of radiographic studies, pulse oximetry, re-evaluation of patient's condition and review of old charts   I assumed direction of critical care for this patient from another provider in my specialty: no     Care discussed with: admitting provider   Comments:     Patient with anemia requiring transfusion and admission.     Medications Ordered in ED Medications  0.9 %  sodium chloride infusion (Manually program via Guardrails IV Fluids) (has no administration in time range)    ED Course  I have reviewed the triage vital signs and the nursing notes.  Pertinent labs & imaging results that were available during my care of the patient were reviewed by me and considered in my medical decision making (see chart for details).    MDM Rules/Calculators/A&P                          Patient presented for evaluation of gradually worsening weakness and low blood counts.  On exam, patient appears chronically ill and pale, however not in acute distress today.  Vitals are stable.  No gross melena noted on my exam, and Hemoccult is negative.  Will obtain labs and continue to monitor.  Labs show anemia of 6.5.  Patient will need repeat transfusion.  Blood ordered.  Chest x-ray and BMP overall reassuring, patient does not appear fluid overloaded to the point where he cannot safely receive blood.  Will call for admission.  Discussed with Dr. Manuella Ghazi from [facility] hospitalist service, patient to be admitted.  Final Clinical Impression(s) / ED Diagnoses Final diagnoses:  Acute on chronic anemia  Symptomatic anemia    Rx / DC Orders ED Discharge Orders    None       Franchot Heidelberg, PA-C 01/06/21 [place], [facility], MD 01/07/21 [phone]

## 2021-01-06 NOTE — H&P (Signed)
History and Physical    Gregory Alvarado Gregory Alvarado DOB: 12-Jan-1951 DOA: 01/06/2021  PCP: Celene Squibb, MD  Patient coming from: PCP, after anemia was noted on CBC during routine visit  I have personally briefly reviewed patient's old medical records in [place]  Chief Complaint: Generalized weakness and recurrent anemia  HPI: Gregory Alvarado is a 70 y.o. male with medical history significant for morbid obesity, 100 pack-year smoking history, COPD, CAD s/p 3-stent cath in 2021, CHF, DM2 on insulin, HTN, colonic diverticular resection (1990s?) and 1.5 year hx of melena presenting today at the request of his PCP for anemia noted on a routine CBC. Roughly in the Fall of 2020, pt began noticing dark tarry stools which have been consistently dark to this day. At the time he went to his PCP and was found to have significant anemia. In February of 2021 his Hb was 5.4 and he received a 3U of packed RBCs, and an upper endoscopy with dilation which revealed several non-bleeding gastric ulcers overlying diffuse moderate inflammation of the entire stomach. Bx were taken and found to have moderate H.pylori but were otherwise unremarkable. Black tarry stools continued, and pt returned in Oct 2021 for repeat EGD and CRC colonoscopy screening, revealing mild gastritis of the stomach and a tubular adenoma in the hepatic flexure of the colon but surgical pathology found to have no evidence of dysplasia or carcinoma. By Nov 2021 his Hb dropped to 6.4 and he received 2U of packed RBCs. He continued to be followed by his PCP and on Jan 03, 2021 his Hb was "low" and he was instructed by his PCP to come to [facility] for transfusion.  Today he has no complaints or symptoms and is only here because his PCP asked him to come. Of note, he only intermittently takes his prescribed medicine because "there are so many", and has no intention of quitting smoking (pt reportedly began smoking at age 11 and currently  smokes 2 packs/day). He denies EtOH or drug use.  ED Course: Pt presented at the request of his PCP for low Hb. At the time, pt endorsed continued black tarry stools but denied any symptoms including fatigue, SOB, yellowing of skin, itching of palms, fevers, chills, nausea, vomiting, or diarrhea. Repeat CBC at ED revealed slightly microcytic anemia (Hb of 6.5), Low Ferritin (13), high TIBC (523) c/w iron deficiency anemia +/- chronic anemia from GI bleed. Pt also grossly volume overloaded, with an elevated BNP of 336. FOBT in ED was negative. Pt was prepped and admitted for transfusion of 2U packed RBCs and Lasix for resolution of acute on chronic anemia.  Review of Systems: Pt endorses black tarry stools of unchanged quality, and otherwise all other systems were reviewed and are negative.  Past Medical History:  Diagnosis Date  . Anemia   . Arthritis    left shoulder  . COPD (chronic obstructive pulmonary disease) ([place])   . Coronary artery disease 09/11/2020   NSTEMI and 3 stents in [facility]  . Diabetes mellitus without complication ([place])   . Dyslipidemia   . Hypertension   . Insomnia   . Insomnia   . Morbid obesity ([place])   . Tobacco use     Past Surgical History:  Procedure Laterality Date  . BIOPSY  10/28/2019   Procedure: BIOPSY;  Surgeon: Danie Binder, MD;  [facility];  Service: Endoscopy;;  gastric  . BIOPSY  04/15/2020   Procedure: BIOPSY;  Surgeon: Daneil Dolin, MD;  [facility];  Service: Endoscopy;;  gastric  . CARDIAC CATHETERIZATION  08/2020   3 stents placed  . COLON RESECTION  1990s?   DIVERTICULITIS  . COLONOSCOPY WITH PROPOFOL N/A 04/15/2020   Rourk: Poor colon prep. two 6 to 8 mm polyps removed from the hepatic flexure, tubular adenomas.  Repeat colonoscopy later this year.  . ESOPHAGOGASTRODUODENOSCOPY (EGD) WITH PROPOFOL N/A 10/28/2019   Dr. Oneida Alar: Multiple cratered gastric ulcers, gastritis/duodenitis.  Path showed H. pylori.   Patient treated with amoxicillin/Biaxin/PPI twice daily.  . ESOPHAGOGASTRODUODENOSCOPY (EGD) WITH PROPOFOL N/A 04/15/2020   Rourk: Patchy gastric erythema status post biopsy to document eradication of H. pylori.  Biopsy showed persistent H. pylori.  Previously noted gastric ulcer is completely healed.  . 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  Marland Kitchen HERNIA REPAIR     UHR  . MALONEY DILATION N/A 04/15/2020   Procedure: Venia Minks DILATION;  Surgeon: Daneil Dolin, MD;  [facility];  Service: Endoscopy;  Laterality: N/A;  . POLYPECTOMY  04/15/2020   Procedure: POLYPECTOMY;  Surgeon: Daneil Dolin, MD;  [facility];  Service: Endoscopy;;  colon  . ROTATOR CUFF REPAIR Bilateral   . TYMPANOPLASTY      Social History  reports that he has been smoking cigarettes. He has been smoking about 2.00 packs per day. He has never used smokeless tobacco. He reports current alcohol use. He reports that he does not use drugs.  Allergies  Allergen Reactions  . Caduet [Amlodipine-Atorvastatin] Swelling    Family History  Problem Relation Age of Onset  . Diabetes Mother   . Heart attack Mother   . Heart attack Father   . Heart attack Brother   . Colon cancer Neg Hx   . Stomach cancer Neg Hx     Prior to Admission medications   Medication Sig Start Date End Date Taking? Authorizing Provider  aspirin EC 81 MG tablet Take 81 mg by mouth daily. Swallow whole.   Yes [provider]  atorvastatin (LIPITOR) 40 MG tablet Take 40 mg by mouth at bedtime. 08/30/19  Yes [provider]  cyanocobalamin (,VITAMIN B-12,) 1000 MCG/ML injection Inject 1,000 mcg into the muscle every 30 (thirty) days.    Yes [provider]  docusate sodium (COLACE) 100 MG capsule Take 1 capsule (100 mg total) by mouth daily. 07/30/20  Yes Barton Dubois, MD  FERREX 150 150 MG  capsule Take 150 mg by mouth daily. 12/04/20  Yes [provider]  folic acid (FOLVITE) 1 MG tablet Take 1 tablet (1 mg total) by mouth daily. Patient taking differently: Take 1 mg by mouth at bedtime. 10/30/19  Yes Emokpae, Courage, MD  furosemide (LASIX) 40 MG tablet Take 1 tablet (40 mg total) by mouth daily as needed. 10/03/20 01/01/21 Yes BranchAlphonse Guild, MD  gabapentin (NEURONTIN) 300 MG capsule Take 1 capsule by mouth at bedtime. 12/30/20  Yes [provider]  hydrochlorothiazide (HYDRODIURIL) 12.5 MG tablet Take 12.5 mg by mouth at bedtime.  01/23/20  Yes [provider]  insulin NPH-regular Human (NOVOLIN 70/30) (70-30) 100 UNIT/ML injection Inject 40 Units into the skin in the morning and at bedtime. Inject 40 units in morning and 40 units every evening   Yes [provider]  lisinopril (PRINIVIL,ZESTRIL) 20 MG tablet Take 1 tablet (20 mg total) by mouth  daily. 06/09/17  Yes Nida, Marella Chimes, MD  metoprolol succinate (TOPROL XL) 100 MG 24 hr tablet Take 1.5 tablets (150 mg total) by mouth daily. Take with or immediately following a meal. 12/03/20  Yes Branch, Alphonse Guild, MD  naproxen (NAPROSYN) 500 MG tablet Take 500 mg by mouth 2 (two) times daily. 08/20/20  Yes [provider]  nitroGLYCERIN (NITROSTAT) 0.4 MG SL tablet DISSOLVE 1 TABLET SUBLINGUALLY AS NEEDED FOR CHEST PAIN, MAY REPEAT EVERY 5 MINUTES. AFTER 3 CALL 911. 01/06/21  Yes Branch, Alphonse Guild, MD  pantoprazole (PROTONIX) 40 MG tablet TAKE ONE TABLET BY MOUTH 2 TIMES A DAY BEFORE A MEAL 12/10/20  Yes Aliene Altes S, PA-C  prasugrel (EFFIENT) 10 MG TABS tablet Take 10 mg by mouth daily. 09/24/20  Yes [provider]  SPIRIVA HANDIHALER 18 MCG inhalation capsule Place 1 capsule into inhaler and inhale daily. 09/24/20  Yes [provider]  spironolactone (ALDACTONE) 25 MG tablet TAKE 1 TABLET BY MOUTH ONCE A DAY. 10/28/20  Yes Branch, Alphonse Guild, MD  SUMAtriptan (IMITREX)  25 MG tablet Take 25 mg by mouth every 2 (two) hours as needed. 10/10/20  Yes [provider]  SYMBICORT 160-4.5 MCG/ACT inhaler Inhale 2 puffs into the lungs 2 (two) times daily. 09/24/20  Yes [provider]  traMADol (ULTRAM) 50 MG tablet Take 50-100 mg by mouth in the morning and at bedtime. 09/20/19  Yes [provider]  zolpidem (AMBIEN) 5 MG tablet Take 5 mg by mouth at bedtime as needed. 09/26/20  Yes [provider]  acetaminophen (TYLENOL) 325 MG tablet Take 2 tablets (650 mg total) by mouth every 6 (six) hours as needed for mild pain (or Fever >/= 101). 10/29/19   Roxan Hockey, MD  isosorbide mononitrate (IMDUR) 30 MG 24 hr tablet Take 1 tablet (30 mg total) by mouth daily. May take 2 if needed 07/09/20 10/07/20  Erlene Quan, PA-C  polyethylene glycol (MIRALAX / GLYCOLAX) 17 g packet Take 17 g by mouth daily. Patient not taking: No sig reported 07/30/20   Barton Dubois, MD    Physical Exam: Vitals:   01/06/21 1110 01/06/21 1155 01/06/21 1200 01/06/21 1430  BP: (!) 131/103 (!) 129/51 (!) 118/52 (!) 154/83  Pulse: 88 74 77 86  Resp: 14 14 17 20   Temp:    97.9 F (36.6 C)  TempSrc:    Oral  SpO2: 99% 96% 94% 92%  Weight:      Height:        Constitutional: NAD, calm, comfortable Vitals:   01/06/21 1110 01/06/21 1155 01/06/21 1200 01/06/21 1430  BP: (!) 131/103 (!) 129/51 (!) 118/52 (!) 154/83  Pulse: 88 74 77 86  Resp: 14 14 17 20   Temp:    97.9 F (36.6 C)  TempSrc:    Oral  SpO2: 99% 96% 94% 92%  Weight:      Height:       Eyes: PERRL, lids and conjunctivae normal. No yellowing of sclera. ENMT: Mucous membranes are moist. Posterior pharynx clear of any exudate or lesions. Normal dentition. No signs of  Neck: normal, supple, no masses, no thyromegaly. No acanthrosis nigracans over posterior neck. Respiratory: mild expiratory wheezes. Normal respiratory effort. No accessory muscle use.  Cardiovascular: Regular rate and rhythm, no  murmurs / rubs / gallops. Significant edema of lower extremities, 3+ pitting on all aspects below the knee.  UTA pedal pulses. No carotid bruits.  Abdomen: no tenderness, no masses palpated. Bowel sounds  positive.  Musculoskeletal: no clubbing / cyanosis. No joint deformity upper and lower extremities. Good ROM, no contractures. Normal muscle tone.  Skin: Scattered seborrheic and macular plaques along lateral aspects of arms. No induration.  Neurologic: CN 2-12 grossly intact. Sensation intact, DTR normal. Strength 5/5 in all 4.  Psychiatric: Normal judgment and insight. Alert and oriented x 3. Normal mood.   Labs on Admission: I have personally reviewed following labs and imaging studies  CBC: Recent Labs  Lab 01/06/21 1129  WBC 12.2*  NEUTROABS 10.4*  HGB 6.5*  HCT 25.4*  MCV 75.4*  PLT 166    Basic Metabolic Panel: Recent Labs  Lab 01/06/21 1129  NA 135  K 3.9  CL 100  CO2 26  GLUCOSE 192*  BUN 23  CREATININE 1.31*  CALCIUM 8.6*    GFR: Estimated Creatinine Clearance: 76.1 mL/min (A) (by C-G formula based on SCr of 1.31 mg/dL (H)).  Liver Function Tests: Recent Labs  Lab 01/06/21 1129  AST 11*  ALT 9  ALKPHOS 81  BILITOT 0.6  PROT 6.7  ALBUMIN 3.3*    Urine analysis: No results found for: COLORURINE, APPEARANCEUR, LABSPEC, PHURINE, GLUCOSEU, HGBUR, BILIRUBINUR, KETONESUR, PROTEINUR, UROBILINOGEN, NITRITE, LEUKOCYTESUR  Radiological Exams on Admission: DG Chest 2 View  Result Date: 01/06/2021 CLINICAL DATA:  Weakness. EXAM: CHEST - 2 VIEW COMPARISON:  July 22, 2020. FINDINGS: The heart size and mediastinal contours are within normal limits. Both lungs are clear. The visualized skeletal structures are unremarkable. IMPRESSION: No active cardiopulmonary disease. Electronically Signed   By: Marijo Conception M.D.   On: 01/06/2021 10:59    EKG: Independently reviewed.   Assessment/Plan Active Problems:   Acute anemia - Pt presented with Hb of 6.5,  slightly microcytic RBCs (MCV 75.4), low Fe (13), high TIBC (523),  low ferritin (6) and high % reticulocytes - Folate, B12, and LDH WNL, Haptoglobin and smear in process - Anemia likely Fe deficiency w/ probable component of GI losses although FOBT in ED was negative - Plan is to admit to unit and transfuse with 2U packed RBCs  # DM - POC glucose consistently elevated in the 100s-200s for the past 6 months but HbA1c has dropped from 11.3 to 5.7 over the past 3 years - Pt only intermittently taking insulin - Continue to check POCT glucose q4hrs, start sliding scale insulin  # Elevated BNP 2/2 volume overload - Likely 2ndary to worsening renal function and elevated Cr - Continue home HCTZ - Increase Lasix after transfusion  # COPD and Tobacco Use - Pt smoking 2pk/day, began smoking at age of 60 - Pt pre-contemplative about smoking cessation and does not want to discuss quitting - Mild expiratory wheezing on physical exam but no SOB, increased work of breathing, or cyanosis noted - Continue Symbicort 2 puffs, twice a day  # ACD - Pt s/p triple-artery stenting in 2021 - Pt denies chest pains at rest, when climbing stairs, or on excursion - Continue home ASA and Lisinopril  # HLD - Continue home statin   DVT prophylaxis: SCDs only Code Status:   FULL CODE Family Communication: None per pt's request Disposition Plan:   Patient is from:  [place], [place]  Anticipated DC to:  Home  Anticipated DC date:  01/07/2021  Anticipated DC barriers: None Consults called:  GI consult Admission status:  Inpatient   Severity of Illness: The appropriate patient status for this patient is INPATIENT. Inpatient status is judged to be reasonable and necessary in order  to provide the required intensity of service to ensure the patient's safety. The patient's presenting symptoms, physical exam findings, and initial radiographic and laboratory data in the context of their chronic comorbidities is felt to  place them at high risk for further clinical deterioration. Furthermore, it is not anticipated that the patient will be medically stable for discharge from the hospital within 2 midnights of admission. The following factors support the patient status of inpatient.   " The patient's presenting symptoms include significant anemia per PCP. " The worrisome physical exam findings include significant pitting edema. " The initial radiographic and laboratory data are worrisome because of severe anemia - Hb 6.5. " The chronic co-morbidities include morbid obesity, 100 pack-year smoking history, COPD, CAD s/p 3-stent cath in 2021, CHF, DM2 on insulin, HTN, colonic diverticular resection (1990s?) and 1.5 year hx of melena .   * I certify that at the point of admission it is my clinical judgment that the patient will require inpatient hospital care spanning beyond 2 midnights from the point of admission due to high intensity of service, high risk for further deterioration and high frequency of surveillance required.*    Note initially prepared by: Modesta Messing MS4 [facility]  How to contact the [facility] Attending or Consulting provider [place] or covering provider during after hours [place], for this patient?   1. Check the care team in [facility] and look for a) attending/consulting [facility] provider listed and b) the [facility] team listed 2. Log into [email or web address] and use [place]'s universal password to access. If you do not have the password, please contact the hospital operator. 3. Locate the [facility] provider you are looking for under [facility] and page to a number that you can be directly reached. 4. If you still have difficulty reaching the provider, please page the [facility] (Director on Call) for the Hospitalists listed on amion for assistance.  01/06/2021, 3:17 PM

## 2021-01-06 NOTE — Progress Notes (Signed)
CBC

## 2021-01-07 DIAGNOSIS — E119 Type 2 diabetes mellitus without complications: Secondary | ICD-10-CM | POA: Diagnosis not present

## 2021-01-07 LAB — CBC
HCT: 27.8 % — ABNORMAL LOW (ref 39.0–52.0)
Hemoglobin: 7.5 g/dL — ABNORMAL LOW (ref 13.0–17.0)
MCH: 20.2 pg — ABNORMAL LOW (ref 26.0–34.0)
MCHC: 27 g/dL — ABNORMAL LOW (ref 30.0–36.0)
MCV: 74.7 fL — ABNORMAL LOW (ref 80.0–100.0)
Platelets: 250 10*3/uL (ref 150–400)
RBC: 3.72 MIL/uL — ABNORMAL LOW (ref 4.22–5.81)
RDW: 20.4 % — ABNORMAL HIGH (ref 11.5–15.5)
WBC: 12 10*3/uL — ABNORMAL HIGH (ref 4.0–10.5)
nRBC: 0.2 % (ref 0.0–0.2)

## 2021-01-07 LAB — HEMOGLOBIN A1C
Hgb A1c MFr Bld: 6.5 % — ABNORMAL HIGH (ref 4.8–5.6)
Mean Plasma Glucose: 139.85 mg/dL

## 2021-01-07 LAB — BASIC METABOLIC PANEL
Anion gap: 11 (ref 5–15)
BUN: 16 mg/dL (ref 8–23)
CO2: 26 mmol/L (ref 22–32)
Calcium: 8.4 mg/dL — ABNORMAL LOW (ref 8.9–10.3)
Chloride: 99 mmol/L (ref 98–111)
Creatinine, Ser: 1.05 mg/dL (ref 0.61–1.24)
GFR, Estimated: >60 mL/min (ref >60)
Glucose, Bld: 121 mg/dL — ABNORMAL HIGH (ref 70–99)
Potassium: 3.5 mmol/L (ref 3.5–5.1)
Sodium: 136 mmol/L (ref 135–145)

## 2021-01-07 LAB — MAGNESIUM: Magnesium: 1.8 mg/dL (ref 1.7–2.4)

## 2021-01-07 LAB — PREPARE RBC (CROSSMATCH)

## 2021-01-07 LAB — PATHOLOGIST SMEAR REVIEW

## 2021-01-07 LAB — OCCULT BLOOD X 1 CARD TO LAB, STOOL: Fecal Occult Bld: NEGATIVE

## 2021-01-07 LAB — GLUCOSE, CAPILLARY
Glucose-Capillary: 128 mg/dL — ABNORMAL HIGH (ref 70–99)
Glucose-Capillary: 171 mg/dL — ABNORMAL HIGH (ref 70–99)

## 2021-01-07 LAB — HAPTOGLOBIN: Haptoglobin: 244 mg/dL (ref 32–363)

## 2021-01-07 MED ORDER — SENNA 8.6 MG PO TABS: 1.0000 | ORAL_TABLET | Freq: Every day | ORAL | 0 refills | Status: DC

## 2021-01-07 MED ORDER — SODIUM CHLORIDE 0.9% IV SOLUTION: Freq: Once | INTRAVENOUS | Status: AC

## 2021-01-07 MED ORDER — BISACODYL 10 MG RE SUPP: 10.0000 mg | Freq: Once | RECTAL | Status: DC

## 2021-01-07 MED ORDER — FUROSEMIDE 10 MG/ML IJ SOLN
40.0000 mg | Freq: Once | INTRAMUSCULAR | Status: AC
  Administered 2021-01-07: 40 mg via INTRAVENOUS
  Filled 2021-01-07: qty 4

## 2021-01-07 NOTE — Discharge Summary (Addendum)
Physician Discharge Summary  Gregory Alvarado [id number] DOB: 1951-08-29 DOA: 01/06/2021  PCP: Celene Squibb, MD  Admit date: 01/06/2021 Discharge date: 01/07/2021  Time spent: minutes  Recommendations for Outpatient Follow-up:  1.  Re: Anemia, per phone conversation on 4/25 with Roseanne Kaufman, pt needs: 1. Outpatient repeat CBC on Thu 4/28 or Fri 4/29 2. F/u outpatient office visit within 2 weeks of d/c (by 5/10) 2. Re: comorbidities, encourage medication adherence 3. Encourage smoking cessation   Discharge Diagnoses:  Active Problems:   Acute anemia - Acute on Chronic Anemia, mixed etiology (hx of chronic GI bleeds & acute severe Fe deficiency)  Discharge Condition: Good  Diet recommendation:  Filed Weights   01/06/21 1010 01/06/21 1825  Weight: 129.3 kg 134.2 kg    History of present illness:  - PMH significant for morbid obesity, 100 pack-year smoking history, COPD, CAD s/p 3-stent cath in 2021, CHF, DM2 on insulin, HTN, colonic diverticular resection (1990s?) and 1.5 year hx of melena  - Pt openly non-compliant with medication adherence - Pt has no intention of quitting smoking  Chronic Anemia - For past 2 years, pt has had black, tarry stools and chronic normocytic anemia c/w GI blood loss requiring multiple transfusions - 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 in 11/21 revealed angioectasias angiodysplasias requiring APC  - 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 was noted to be on 11/21 with noted angioectasias and angiodysplasias requiring APC  - Patient unfortunately continues to likely use NSAIDs and is high risk for bleeding along with the use of dual antiplatelet therapy for CAD with prior stent   Elevated BNP c/w Volume Overload - Pt generally non-compliant with home BP meds - Pt recently prescribed indomethacin for Gout which could exacerbate vol retention   Hospital Course:  - 4/25     - pt arrived per PCP recommendation for workup of office CBC draw revealing anemia     - CBC and Fe panel on arrival  demonstrated Hb of 6.4, low MCV, low Fe and ferritin, and high TIBC c/w Fe deficiency anemia     - FOBT negative, Folate, B12, Haptoglobin, and LDH WNL making bleeds, vitamin deficiency or hemolysis unlikely etiologies of current anemia     - Additionally, BNP elevated, low albumin, and profound pitting edema c/w volume overload     - Given ACS hx, goal Hb is > 8     - Pt received 2U pRBCs and Feraheme, Hb rose to 7.5 - 4/26     - Repeat FOBT negative     - Pt received 1 additional U pRBCs, Hb rose to ________     - d/c in stable condition with Hb > 8 and edema greatly reducted - Discussed post-d/c plan with PCP including repeat CBC this Thu or Fri and f/u office visit within 2 weeks - Plan discussed w/pt  Procedures: - CXR 4/25 not suggestive of pulmonary congestion or volume overload despite elevated BNP  Consultations: - No Consults  Discharge Exam: Vitals:   01/07/21 0727 01/07/21 0759  BP:  (!) 167/66  Pulse:  82  Resp:  18  Temp:  98.3 F (36.8 C)  SpO2: 92% 95%    General: NAD Cardiovascular: RRR, no murmurs, rubs, or gallops Respiratory: No increased work of breathing, mild expiratory wheezes with deep breaths  Discharge Instructions - Per Telephone conversation with Roseanne Kaufman on 01/06/21 pt needs repeat CBC as outpatient on Thursday 4/28 or Friday 4/29. - Encourage medication compliance, particularly for BP and glycemic control among other conditions -  Encourage smoking cessation    Allergies  Allergen Reactions  . Caduet [Amlodipine-Atorvastatin] Swelling      The results of significant diagnostics from this hospitalization (including imaging, microbiology, ancillary and laboratory) are listed below for reference.    Significant Diagnostic Studies: DG Chest 2 View  Result Date: 01/06/2021 CLINICAL DATA:  Weakness. EXAM: CHEST - 2 VIEW COMPARISON:  July 22, 2020. FINDINGS: The heart size and mediastinal contours are within normal limits. Both lungs are  clear. The visualized skeletal structures are unremarkable. IMPRESSION: No active cardiopulmonary disease. Electronically Signed   By: Marijo Conception M.D.   On: 01/06/2021 10:59   ECHOCARDIOGRAM COMPLETE  Result Date: 12/19/2020    ECHOCARDIOGRAM REPORT   [name] Date of Exam: 12/19/2020 Medical Rec #:  [id number]     Height:       72.0 in Accession #:    [id number]    Weight:       289.0 lb Date of Birth:  1951/07/19     BSA:          2.491 m Patient Age:    70 years      BP:           131/72 mmHg Patient Gender: M             HR:           77 bpm. Exam Location:  [name] Procedure: 2D Echo, Cardiac Doppler and Color Doppler Indications:    I50.22 (ICD-10-CM) - Chronic systolic heart failure  History:        Patient has prior history of Echocardiogram examinations, most                 recent 07/24/2020. CHF, CAD, COPD; Risk Factors:Hypertension,                 Diabetes, Dyslipidemia and Current Smoker. Morbid obesity ([place])                 (From Hx).  Sonographer:    Alvino Chapel RCS Referring Phys: [id number] [place]  1. Left ventricular ejection fraction, by estimation, is 60 to 65%. The left ventricle has normal function. The left ventricle has no regional wall motion abnormalities. There is mild left ventricular hypertrophy. Left ventricular diastolic parameters are indeterminate. Elevated left atrial pressure.  2. Right ventricular systolic function is normal. The right ventricular size is normal.  3. Left atrial size was severely dilated.  4. Right atrial size was mildly dilated.  5. The mitral valve is normal in structure. Mild mitral valve regurgitation. No evidence of mitral stenosis.  6. The aortic valve is tricuspid. Aortic valve regurgitation is not visualized. No aortic stenosis is present.  7. The inferior vena cava is dilated in size with >50% respiratory variability, suggesting right atrial pressure of 8 mmHg. FINDINGS  Left Ventricle: Left ventricular  ejection fraction, by estimation, is 60 to 65%. The left ventricle has normal function. The left ventricle has no regional wall motion abnormalities. The left ventricular internal cavity size was normal in size. There is  mild left ventricular hypertrophy. Left ventricular diastolic parameters are indeterminate. Elevated left atrial pressure. Right Ventricle: The right ventricular size is normal. Right vetricular wall thickness was not assessed. Right ventricular systolic function is normal. Left Atrium: Left atrial size was severely dilated. Right Atrium: Right atrial size was mildly dilated. Pericardium: There is no evidence of pericardial effusion. Mitral Valve:  The mitral valve is normal in structure. Mild mitral valve regurgitation. No evidence of mitral valve stenosis. Tricuspid Valve: The tricuspid valve is normal in structure. Tricuspid valve regurgitation is mild . No evidence of tricuspid stenosis. Aortic Valve: The aortic valve is tricuspid. Aortic valve regurgitation is not visualized. No aortic stenosis is present. Aortic valve mean gradient measures 4.5 mmHg. Aortic valve peak gradient measures 8.1 mmHg. Aortic valve area, by VTI measures 2.64 cm. Pulmonic Valve: The pulmonic valve was not well visualized. Pulmonic valve regurgitation is not visualized. No evidence of pulmonic stenosis. Aorta: The aortic root is normal in size and structure. Pulmonary Artery: Mild pulmonary HTN, PASP is 35 mmHg. Venous: The inferior vena cava is dilated in size with greater than 50% respiratory variability, suggesting right atrial pressure of 8 mmHg. IAS/Shunts: No atrial level shunt detected by color flow Doppler.  LEFT VENTRICLE PLAX 2D LVIDd:         5.90 cm  Diastology LVIDs:         3.60 cm  LV e' medial:    5.66 cm/s LV PW:         1.30 cm  LV E/e' medial:  24.7 LV IVS:        1.30 cm  LV e' lateral:   5.55 cm/s LVOT diam:     2.00 cm  LV E/e' lateral: 25.2 LV SV:         82 LV SV Index:   33 LVOT Area:     3.14  cm  RIGHT VENTRICLE RV Mid diam:    3.50 cm RV S prime:     12.70 cm/s TAPSE (M-mode): 3.0 cm LEFT ATRIUM              Index       RIGHT ATRIUM           Index LA diam:        5.30 cm  2.13 cm/m  RA Area:     22.60 cm LA Vol (A2C):   80.1 ml  32.15 ml/m RA Volume:   71.40 ml  28.66 ml/m LA Vol (A4C):   117.0 ml 46.96 ml/m LA Biplane Vol: 99.6 ml  39.98 ml/m  AORTIC VALVE AV Area (Vmax):    2.52 cm AV Area (Vmean):   2.43 cm AV Area (VTI):     2.64 cm AV Vmax:           142.38 cm/s AV Vmean:          97.903 cm/s AV VTI:            0.310 m AV Peak Grad:      8.1 mmHg AV Mean Grad:      4.5 mmHg LVOT Vmax:         114.00 cm/s LVOT Vmean:        75.800 cm/s LVOT VTI:          0.261 m LVOT/AV VTI ratio: 0.84  AORTA Ao Root diam: 3.00 cm MITRAL VALVE MV Area (PHT): 2.89 cm     SHUNTS MV Decel Time: 263 msec     Systemic VTI:  0.26 m MV E velocity: 140.00 cm/s  Systemic Diam: 2.00 cm MV A velocity: 116.50 cm/s MV E/A ratio:  1.20 Carlyle Dolly MD Electronically signed by Carlyle Dolly MD Signature Date/Time: 12/19/2020/4:23:44 PM    Final     Microbiology: Recent Results (from the past 240 hour(s))  Resp Panel by RT-PCR (Flu A&B, Covid) Nasopharyngeal  Swab     Status: None   Collection Time: 01/06/21 11:28 AM   Specimen: Nasopharyngeal Swab; Nasopharyngeal(NP) swabs in vial transport medium  Result Value Ref Range Status   SARS Coronavirus 2 by RT PCR NEGATIVE NEGATIVE Final    Comment: (NOTE) SARS-CoV-2 target nucleic acids are NOT DETECTED.  The SARS-CoV-2 RNA is generally detectable in upper respiratory specimens during the acute phase of infection. The lowest concentration of SARS-CoV-2 viral copies this assay can detect is 138 copies/mL. A negative result does not preclude SARS-Cov-2 infection and should not be used as the sole basis for treatment or other patient management decisions. A negative result may occur with  improper specimen collection/handling, submission of specimen  other than nasopharyngeal swab, presence of viral mutation(s) within the areas targeted by this assay, and inadequate number of viral copies(<138 copies/mL). A negative result must be combined with clinical observations, patient history, and epidemiological information. The expected result is Negative.  Fact Sheet for Patients:  EntrepreneurPulse.com.au  Fact Sheet for Healthcare Providers:  IncredibleEmployment.be  This test is no t yet approved or cleared by the [place] FDA and  has been authorized for detection and/or diagnosis of SARS-CoV-2 by FDA under an Emergency Use Authorization (EUA). This EUA will remain  in effect (meaning this test can be used) for the duration of the COVID-19 declaration under Section 564(b)(1) of the Act, 21 U.S.C.section 360bbb-3(b)(1), unless the authorization is terminated  or revoked sooner.       Influenza A by PCR NEGATIVE NEGATIVE Final   Influenza B by PCR NEGATIVE NEGATIVE Final    Comment: (NOTE) The Xpert Xpress SARS-CoV-2/FLU/RSV plus assay is intended as an aid in the diagnosis of influenza from Nasopharyngeal swab specimens and should not be used as a sole basis for treatment. Nasal washings and aspirates are unacceptable for Xpert Xpress SARS-CoV-2/FLU/RSV testing.  Fact Sheet for Patients: EntrepreneurPulse.com.au  Fact Sheet for Healthcare Providers: IncredibleEmployment.be  This test is not yet approved or cleared by the [place] FDA and has been authorized for detection and/or diagnosis of SARS-CoV-2 by FDA under an Emergency Use Authorization (EUA). This EUA will remain in effect (meaning this test can be used) for the duration of the COVID-19 declaration under Section 564(b)(1) of the Act, 21 U.S.C. section 360bbb-3(b)(1), unless the authorization is terminated or revoked.  Performed at [facility], [address], [place], [place]  [address]      Labs: Basic Metabolic Panel: Recent Labs  Lab 01/06/21 1129 01/07/21 0637  NA 135 136  K 3.9 3.5  CL 100 99  CO2 26 26  GLUCOSE 192* 121*  BUN 23 16  CREATININE 1.31* 1.05  CALCIUM 8.6* 8.4*  MG  --  1.8   Liver Function Tests: Recent Labs  Lab 01/06/21 1129  AST 11*  ALT 9  ALKPHOS 81  BILITOT 0.6  PROT 6.7  ALBUMIN 3.3*   No results for input(s): LIPASE, AMYLASE in the last 168 hours. No results for input(s): AMMONIA in the last 168 hours. CBC: Recent Labs  Lab 01/06/21 1129 01/07/21 0637  WBC 12.2* 12.0*  NEUTROABS 10.4*  --   HGB 6.5* 7.5*  HCT 25.4* 27.8*  MCV 75.4* 74.7*  PLT 272 250   Cardiac Enzymes: No results for input(s): CKTOTAL, CKMB, CKMBINDEX, TROPONINI in the last 168 hours. BNP: BNP (last 3 results) Recent Labs    07/22/20 1936 01/06/21 1129  BNP 165.0* 336.0*    ProBNP (last 3 results) No results  for input(s): PROBNP in the last 8760 hours.  CBG: Recent Labs  Lab 01/06/21 1714 01/06/21 2119 01/07/21 0739  GLUCAP 174* 139* 128*       Signed:  Modesta Messing MS4 [facility] 01/07/2021, 10:28 AM

## 2021-01-08 ENCOUNTER — Encounter (HOSPITAL_COMMUNITY): Payer: Medicare Other

## 2021-01-08 LAB — TYPE AND SCREEN
ABO/RH(D): A POS
Antibody Screen: NEGATIVE
Unit division: 0
Unit division: 0
Unit division: 0

## 2021-01-08 LAB — BPAM RBC
Blood Product Expiration Date: 202205072359
Blood Product Expiration Date: 202205072359
Blood Product Expiration Date: 202205182359
ISSUE DATE / TIME: 202204251454
ISSUE DATE / TIME: 202204252254
ISSUE DATE / TIME: 202204261023
Unit Type and Rh: 600
Unit Type and Rh: 6200
Unit Type and Rh: 6200

## 2021-01-10 ENCOUNTER — Encounter (HOSPITAL_COMMUNITY): Payer: Medicare Other

## 2021-01-13 ENCOUNTER — Encounter (HOSPITAL_COMMUNITY): Payer: Medicare Other

## 2021-01-15 ENCOUNTER — Encounter (HOSPITAL_COMMUNITY): Payer: Medicare Other

## 2021-01-16 NOTE — Progress Notes (Signed)
Discharge Progress Report  Patient Details  Name: Gregory Alvarado MRN: [id number] Date of Birth: Sep 21, 1950 Referring Provider:   Flowsheet Row CARDIAC REHAB PHASE II ORIENTATION from 11/14/2020 in [place]  Referring Provider Dr. Harl Bowie       Number of Visits: 10  Reason for Discharge:  Early Exit:  Lack of attendance  Smoking History:  Social History   Tobacco Use  Smoking Status Current Every Day Smoker  . Packs/day: 2.00  . Types: Cigarettes  Smokeless Tobacco Never Used    Diagnosis:  S/P coronary artery stent placement  NSTEMI (non-ST elevated myocardial infarction) ([place])  ADL [facility]:   Initial Exercise Prescription:  Initial Exercise Prescription - 11/14/20 1000      Date of Initial Exercise RX and Referring Provider   Date 11/14/20    Referring Provider Dr. Harl Bowie    Expected Discharge Date 02/05/21      NuStep   Level 1    SPM 60    Minutes 39      Prescription Details   Frequency (times per week) 3    Duration Progress to 30 minutes of continuous aerobic without signs/symptoms of physical distress      Intensity   THRR 40-80% of Max Heartrate 61-122    Ratings of Perceived Exertion 11-13      Progression   Progression Continue progressive overload as per policy without signs/symptoms or physical distress.      Resistance Training   Training Prescription Yes    Weight 3 lbs    Reps 10-15           Discharge Exercise Prescription (Final Exercise Prescription Changes):  Exercise Prescription Changes - 12/11/20 1200      Response to Exercise   Blood Pressure (Admit) 124/50    Blood Pressure (Exercise) 132/56    Blood Pressure (Exit) 108/46    Heart Rate (Admit) 76 bpm    Heart Rate (Exercise) 106 bpm    Heart Rate (Exit) 85 bpm    Rating of Perceived Exertion (Exercise) 12    Duration Continue with 30 min of aerobic exercise without signs/symptoms of physical distress.    Intensity THRR unchanged       Progression   Progression Continue to progress workloads to maintain intensity without signs/symptoms of physical distress.      Resistance Training   Training Prescription Yes    Weight 3 lbs    Reps 10-15    Time 10 Minutes      NuStep   Level 1    SPM 81    Minutes 39    METs 1.9           Functional Capacity:  6 Minute Walk    Row Name 11/14/20 1026         6 Minute Walk   Phase Initial     Distance 500 feet     Walk Time 6 minutes     # of Rest Breaks 1  took a 3 min break     MPH 0.9     METS 0.81     RPE 9     VO2 Peak 2.84     Symptoms Yes (comment)     Comments pain in quads 7/10     Resting HR 76 bpm     Resting BP 108/52     Resting Oxygen Saturation  98 %     Exercise Oxygen Saturation  during 6 min walk 98 %  Max Ex. HR 93 bpm     Max Ex. BP 122/48     2 Minute Post BP 102/48            Psychological, QOL, Others - Outcomes: PHQ 2/9: Depression screen [employer] 2/9 11/14/2020 08/06/2020 08/02/2020 06/09/2017 02/24/2017  Decreased Interest 0 0 0 0 0  Down, Depressed, Hopeless 0 0 0 0 0  PHQ - 2 Score 0 0 0 0 0  Altered sleeping 2 - - - -  Tired, decreased energy 3 - - - -  Change in appetite 0 - - - -  Feeling bad or failure about yourself  2 - - - -  Trouble concentrating 0 - - - -  Moving slowly or fidgety/restless 0 - - - -  Suicidal thoughts 0 - - - -  PHQ-9 Score 7 - - - -  Difficult doing work/chores Somewhat difficult - - - -    Quality of Life:  Quality of Life - 11/14/20 1031      Quality of Life   Select Quality of Life      Quality of Life Scores   Health/Function Pre 15.4 %    Socioeconomic Pre 20.92 %    Psych/Spiritual Pre 9.43 %    Family Pre 14.8 %    GLOBAL Pre 15.05 %           Personal Goals: Goals established at orientation with interventions provided to work toward goal.  Personal Goals and Risk Factors at Admission - 11/14/20 0846      Core Components/Risk Factors/Patient Goals on Admission    Weight  Management Yes;Weight Loss    Expected Outcomes Short Term: Continue to assess and modify interventions until short term weight is achieved;Long Term: Adherence to nutrition and physical activity/exercise program aimed toward attainment of established weight goal;Weight Loss: Understanding of general recommendations for a balanced deficit meal plan, which promotes 1-2 lb weight loss per week and includes a negative energy balance of [phone] kcal/d;Understanding recommendations for meals to include 15-35% energy as protein, 25-35% energy from fat, 35-60% energy from carbohydrates, less than 200mg  of dietary cholesterol, 20-35 gm of total fiber daily;Understanding of distribution of calorie intake throughout the day with the consumption of 4-5 meals/snacks    Tobacco Cessation Yes    Number of packs per day 2 packs per day    Intervention Assist the participant in steps to quit. Provide individualized education and counseling about committing to Tobacco Cessation, relapse prevention, and pharmacological support that can be provided by physician.;Advice worker, assist with locating and accessing local/national Quit Smoking programs, and support quit date choice.    Expected Outcomes Short Term: Will demonstrate readiness to quit, by selecting a quit date.;Short Term: Will quit all tobacco product use, adhering to prevention of relapse plan.;Long Term: Complete abstinence from all tobacco products for at least 12 months from quit date.    Improve shortness of breath with ADL's Yes    Intervention Provide education, individualized exercise plan and daily activity instruction to help decrease symptoms of SOB with activities of daily living.    Expected Outcomes Short Term: Improve cardiorespiratory fitness to achieve a reduction of symptoms when performing ADLs;Long Term: Be able to perform more ADLs without symptoms or delay the onset of symptoms    Diabetes Yes    Intervention Provide  education about signs/symptoms and action to take for hypo/hyperglycemia.;Provide education about proper nutrition, including hydration, and aerobic/resistive exercise prescription along with prescribed medications  to achieve blood glucose in normal ranges: Fasting glucose 65-99 mg/dL    Expected Outcomes Short Term: Participant verbalizes understanding of the signs/symptoms and immediate care of hyper/hypoglycemia, proper foot care and importance of medication, aerobic/resistive exercise and nutrition plan for blood glucose control.;Long Term: Attainment of HbA1C < 7%.    Hypertension Yes    Intervention Provide education on lifestyle modifcations including regular physical activity/exercise, weight management, moderate sodium restriction and increased consumption of fresh fruit, vegetables, and low fat dairy, alcohol moderation, and smoking cessation.;Monitor prescription use compliance.    Expected Outcomes Short Term: Continued assessment and intervention until BP is < 140/4mm HG in hypertensive participants. < 130/38mm HG in hypertensive participants with diabetes, heart failure or chronic kidney disease.;Long Term: Maintenance of blood pressure at goal levels.            Personal Goals Discharge:  Goals and Risk Factor Review    Row Name 11/18/20 0856 12/11/20 0815           Core Components/Risk Factors/Patient Goals Review   Personal Goals Review Weight Management/Obesity;Tobacco Cessation Weight Management/Obesity;Tobacco Cessation;Diabetes      Review Patient referred to cardiac rehab with NSTEMI and stent placement. He has multiple risk factors for CAD and is participating in the program for risk modification. His personal goals for the program are to quit smoking and get stronger. We will monitor his progress as he works toward meeting these goals. Patient has completed 8 sessions gaining 2 lbs since his initial visit. He is doing ok in the program. He only does the nustep and does  complain about his legs feeling weak. He is very decontitioned and continues to smoke 2ppd.  He saw his cardiologist Dr Harl Bowie 3/22 and complained of chest pain with exertion but has no complained in CR. He increased his Metoprolol to 150 mg daily. Patient's PCP has also made some adjustments in his insulin dosage after staff notifited him of  hypoglycemic epidoses at night and him not being able to exercise due to being lesss than 110mg /dl. His personal goals for the program are to quit smoking and get stronger. We will continue to monitor his progress as he works toward meeting these goals.      Expected Outcomes Patient will complete the program meeting both personal and program goals. Patient will complete the program meeting both personal and program goals.             Exercise Goals and Review:  Exercise Goals    Row Name 11/14/20 1029 11/18/20 1142 12/11/20 0825         Exercise Goals   Increase Physical Activity Yes Yes Yes     Intervention Provide advice, education, support and counseling about physical activity/exercise needs.;Develop an individualized exercise prescription for aerobic and resistive training based on initial evaluation findings, risk stratification, comorbidities and participant's personal goals. Provide advice, education, support and counseling about physical activity/exercise needs.;Develop an individualized exercise prescription for aerobic and resistive training based on initial evaluation findings, risk stratification, comorbidities and participant's personal goals. Provide advice, education, support and counseling about physical activity/exercise needs.;Develop an individualized exercise prescription for aerobic and resistive training based on initial evaluation findings, risk stratification, comorbidities and participant's personal goals.     Expected Outcomes Short Term: Attend rehab on a regular basis to increase amount of physical activity.;Long Term: Add in home  exercise to make exercise part of routine and to increase amount of physical activity.;Long Term: Exercising regularly at least 3-5 days  a week. Short Term: Attend rehab on a regular basis to increase amount of physical activity.;Long Term: Add in home exercise to make exercise part of routine and to increase amount of physical activity.;Long Term: Exercising regularly at least 3-5 days a week. Short Term: Attend rehab on a regular basis to increase amount of physical activity.;Long Term: Add in home exercise to make exercise part of routine and to increase amount of physical activity.;Long Term: Exercising regularly at least 3-5 days a week.     Increase Strength and Stamina Yes Yes Yes     Intervention Provide advice, education, support and counseling about physical activity/exercise needs.;Develop an individualized exercise prescription for aerobic and resistive training based on initial evaluation findings, risk stratification, comorbidities and participant's personal goals. Provide advice, education, support and counseling about physical activity/exercise needs.;Develop an individualized exercise prescription for aerobic and resistive training based on initial evaluation findings, risk stratification, comorbidities and participant's personal goals. Provide advice, education, support and counseling about physical activity/exercise needs.;Develop an individualized exercise prescription for aerobic and resistive training based on initial evaluation findings, risk stratification, comorbidities and participant's personal goals.     Expected Outcomes Short Term: Increase workloads from initial exercise prescription for resistance, speed, and METs.;Short Term: Perform resistance training exercises routinely during rehab and add in resistance training at home;Long Term: Improve cardiorespiratory fitness, muscular endurance and strength as measured by increased METs and functional capacity (6MWT) Short Term: Increase  workloads from initial exercise prescription for resistance, speed, and METs.;Short Term: Perform resistance training exercises routinely during rehab and add in resistance training at home;Long Term: Improve cardiorespiratory fitness, muscular endurance and strength as measured by increased METs and functional capacity (6MWT) Short Term: Increase workloads from initial exercise prescription for resistance, speed, and METs.;Short Term: Perform resistance training exercises routinely during rehab and add in resistance training at home;Long Term: Improve cardiorespiratory fitness, muscular endurance and strength as measured by increased METs and functional capacity (6MWT)     Able to understand and use rate of perceived exertion (RPE) scale Yes Yes Yes     Intervention Provide education and explanation on how to use RPE scale Provide education and explanation on how to use RPE scale Provide education and explanation on how to use RPE scale     Expected Outcomes Short Term: Able to use RPE daily in rehab to express subjective intensity level;Long Term:  Able to use RPE to guide intensity level when exercising independently Short Term: Able to use RPE daily in rehab to express subjective intensity level;Long Term:  Able to use RPE to guide intensity level when exercising independently Short Term: Able to use RPE daily in rehab to express subjective intensity level;Long Term:  Able to use RPE to guide intensity level when exercising independently     Knowledge and understanding of Target Heart Rate Range (THRR) Yes Yes Yes     Intervention Provide education and explanation of THRR including how the numbers were predicted and where they are located for reference Provide education and explanation of THRR including how the numbers were predicted and where they are located for reference Provide education and explanation of THRR including how the numbers were predicted and where they are located for reference      Expected Outcomes Long Term: Able to use THRR to govern intensity when exercising independently;Short Term: Able to state/look up THRR;Short Term: Able to use daily as guideline for intensity in rehab Long Term: Able to use THRR to govern intensity when exercising  independently;Short Term: Able to state/look up THRR;Short Term: Able to use daily as guideline for intensity in rehab Long Term: Able to use THRR to govern intensity when exercising independently;Short Term: Able to state/look up THRR;Short Term: Able to use daily as guideline for intensity in rehab     Able to check pulse independently Yes Yes Yes     Intervention Provide education and demonstration on how to check pulse in carotid and radial arteries.;Review the importance of being able to check your own pulse for safety during independent exercise Provide education and demonstration on how to check pulse in carotid and radial arteries.;Review the importance of being able to check your own pulse for safety during independent exercise Provide education and demonstration on how to check pulse in carotid and radial arteries.;Review the importance of being able to check your own pulse for safety during independent exercise     Expected Outcomes Short Term: Able to explain why pulse checking is important during independent exercise;Long Term: Able to check pulse independently and accurately Short Term: Able to explain why pulse checking is important during independent exercise;Long Term: Able to check pulse independently and accurately Short Term: Able to explain why pulse checking is important during independent exercise;Long Term: Able to check pulse independently and accurately     Understanding of Exercise Prescription Yes Yes Yes     Intervention Provide education, explanation, and written materials on patient's individual exercise prescription Provide education, explanation, and written materials on patient's individual exercise prescription  Provide education, explanation, and written materials on patient's individual exercise prescription     Expected Outcomes Short Term: Able to explain program exercise prescription;Long Term: Able to explain home exercise prescription to exercise independently Short Term: Able to explain program exercise prescription;Long Term: Able to explain home exercise prescription to exercise independently Short Term: Able to explain program exercise prescription;Long Term: Able to explain home exercise prescription to exercise independently            Exercise Goals Re-Evaluation:  Exercise Goals Re-Evaluation    [place] Name 11/18/20 1142 12/11/20 1200           Exercise Goal Re-Evaluation   Exercise Goals Review Increase Physical Activity;Increase Strength and Stamina;Able to understand and use rate of perceived exertion (RPE) scale;Knowledge and understanding of Target Heart Rate Range (THRR);Able to check pulse independently;Understanding of Exercise Prescription Increase Physical Activity;Increase Strength and Stamina;Able to understand and use rate of perceived exertion (RPE) scale;Knowledge and understanding of Target Heart Rate Range (THRR);Able to check pulse independently;Understanding of Exercise Prescription      Comments Pt was set to begin exercise on 11/18/2020, but patient arrived at the wrong time and became argumentative with staff. Pt states he will return 11/20/2020. Patient has completed 8 exercise sessions. He has tolerated exercise well for the most part. He takes frequent breaks to catch his breath and to give his legs a rest. He has taken less breaks since the beginning of the program. He is very deconditioned. He has to take breaks during the warm up and strength training. Most days he sits down to complete the warm up and resistance training. He is currently exercising at 1.9 METs on the NuStep. Will continue to monitor and progress as able.      Expected Outcomes Through exercise in the  cardiac rehab program and by engaging in a home exercise program, the patient will get stronger and reach their goals. Through exercise in the cardiac rehab program and by engaging in  a home exercise program, the patient will get stronger and reach their goals.             Nutrition & Weight - Outcomes:  Pre Biometrics - 12/02/20 1032      Pre Biometrics   Weight 130.3 kg    BMI (Calculated) 38.95            Nutrition:  Nutrition Therapy & Goals - 11/18/20 0855      Personal Nutrition Goals   Comments Patient scored 110 on his diet assessment. We have 2 educational sessions on heart healthy nutrition with handouts. We also offer our RD services here at [facility]. Patient declined.      Intervention Plan   Intervention Nutrition handout(s) given to patient.           Nutrition Discharge:  Nutrition Assessments - 11/14/20 0836      MEDFICTS Scores   Pre Score 110           Education Questionnaire Score:  Knowledge Questionnaire Score - 11/14/20 0841      Knowledge Questionnaire Score   Pre Score 23/28          Patient was discharged due to lack of attendance.

## 2021-01-16 NOTE — Addendum Note (Signed)
Encounter addended by: Lorin Glass on: 01/16/2021 3:29 PM  Actions taken: Clinical Note Signed, Episode resolved

## 2021-01-17 ENCOUNTER — Encounter (HOSPITAL_COMMUNITY): Payer: Medicare Other

## 2021-01-20 ENCOUNTER — Encounter (HOSPITAL_COMMUNITY): Payer: Medicare Other

## 2021-01-22 ENCOUNTER — Encounter (HOSPITAL_COMMUNITY): Payer: Medicare Other

## 2021-01-22 ENCOUNTER — Ambulatory Visit: Payer: Medicare Other | Admitting: Gastroenterology

## 2021-01-22 ENCOUNTER — Encounter: Payer: Self-pay | Admitting: Gastroenterology

## 2021-01-22 ENCOUNTER — Other Ambulatory Visit: Payer: Self-pay

## 2021-01-22 VITALS — BP 128/61 | HR 71 | Temp 97.7°F | Ht 72.0 in | Wt 284.0 lb

## 2021-01-22 DIAGNOSIS — D649 Anemia, unspecified: Secondary | ICD-10-CM

## 2021-01-22 NOTE — Progress Notes (Signed)
Primary Care Physician: Celene Squibb, MD  Primary Gastroenterologist:  Garfield Cornea, MD   Chief Complaint  Patient presents with  . Anemia    HFU. Reports stools are very black, feels weak    HPI: Gregory Alvarado is a 70 y.o. male here for hospital follow up. Patient seen in the hospital in 07/2020. H/o PUD in 10/2019, IDA, admitted with acute on chronic anemia. 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 Recommend outpatient colonoscopy due to poor prep earlier in the year. H.pylori eradication needed. Given h/o SBO would try to avoid small bowel capsule study, if absolutely needed, he will need Agile first.   Patient readmitted 01/06/21 with Hgb of 6.5. Received 2 units transfusion and up to 7.5 on 01/07/21.  Patient presents today for follow up. States his stools always black, have been like that chronically. No brbpr. Several per day. Solid to loose. On iron. No stomach pain. No n/v. Able to eat what he wants. Occasional heartburn. No BC powders. ASA 81mg  daily.  Effient daily. Denies NSAIDs currently.  States he had 2 iron infusions that cost him $1000 and he cannot afford more.   Current Outpatient Medications  Medication Sig Dispense Refill  . acetaminophen (TYLENOL) 325 MG tablet Take 2 tablets (650 mg total) by mouth every 6 (six) hours as needed for mild pain (or Fever >/= 101). 12 tablet 0  . aspirin EC 81 MG tablet Take 81 mg by mouth daily. Swallow whole.    Marland Kitchen atorvastatin (LIPITOR) 40 MG tablet Take 40 mg by mouth at bedtime.    . cyanocobalamin (,VITAMIN B-12,) 1000 MCG/ML injection Inject 1,000 mcg into the muscle every 30 (thirty) days.     Marland Kitchen FERREX 150 150 MG capsule Take 150 mg by mouth daily.    . folic acid (FOLVITE) 1 MG tablet Take 1 tablet (1 mg total) by mouth daily. (Patient taking differently: Take 1 mg by mouth at bedtime.) 30 tablet 2  . gabapentin (NEURONTIN)  300 MG capsule Take 1 capsule by mouth at bedtime.    . hydrochlorothiazide (HYDRODIURIL) 12.5 MG tablet Take 12.5 mg by mouth at bedtime.     . insulin NPH-regular Human (NOVOLIN 70/30) (70-30) 100 UNIT/ML injection Inject 40 Units into the skin in the morning and at bedtime. Inject 40 units in morning and 40 units every evening    . lisinopril (PRINIVIL,ZESTRIL) 20 MG tablet Take 1 tablet (20 mg total) by mouth daily. 30 tablet 6  . metoprolol succinate (TOPROL XL) 100 MG 24 hr tablet Take 1.5 tablets (150 mg total) by mouth daily. Take with or immediately following a meal. 180 tablet 3  . nitroGLYCERIN (NITROSTAT) 0.4 MG SL tablet DISSOLVE 1 TABLET SUBLINGUALLY AS NEEDED FOR CHEST PAIN, MAY REPEAT EVERY 5 MINUTES. AFTER 3 CALL 911. 25 tablet 3  . pantoprazole (PROTONIX) 40 MG tablet TAKE ONE TABLET BY MOUTH 2 TIMES A DAY BEFORE A MEAL 60 tablet 5  . prasugrel (EFFIENT) 10 MG TABS tablet Take 10 mg by mouth daily.    Marland Kitchen senna (SENOKOT) 8.6 MG TABS tablet Take 1 tablet (8.6 mg total) by mouth daily. 120 tablet 0  . SPIRIVA HANDIHALER 18 MCG inhalation capsule Place 1 capsule into inhaler and inhale daily.    Marland Kitchen spironolactone (ALDACTONE) 25 MG tablet TAKE 1 TABLET BY MOUTH ONCE A DAY. 30 tablet 11  . SUMAtriptan (IMITREX)  25 MG tablet Take 25 mg by mouth every 2 (two) hours as needed.    . SYMBICORT 160-4.5 MCG/ACT inhaler Inhale 2 puffs into the lungs 2 (two) times daily.    . traMADol (ULTRAM) 50 MG tablet Take 50-100 mg by mouth in the morning and at bedtime.    Marland Kitchen zolpidem (AMBIEN) 5 MG tablet Take 5 mg by mouth at bedtime as needed.    . furosemide (LASIX) 40 MG tablet Take 1 tablet (40 mg total) by mouth daily as needed. 90 tablet 1  . isosorbide mononitrate (IMDUR) 30 MG 24 hr tablet Take 1 tablet (30 mg total) by mouth daily. May take 2 if needed 180 tablet 3   No current facility-administered medications for this visit.    Allergies as of 01/22/2021 - Review Complete 01/22/2021  Allergen  Reaction Noted  . Caduet [amlodipine-atorvastatin] Swelling 09/18/2015   Past Medical History:  Diagnosis Date  . Anemia   . Arthritis    left shoulder  . COPD (chronic obstructive pulmonary disease) ([place])   . Coronary artery disease 09/11/2020   NSTEMI and 3 stents in [facility]  . Diabetes mellitus without complication ([place])   . Dyslipidemia   . Hypertension   . Insomnia   . Insomnia   . Morbid obesity ([place])   . Tobacco use    Past Surgical History:  Procedure Laterality Date  . BIOPSY  10/28/2019   Procedure: BIOPSY;  Surgeon: Danie Binder, MD;  [facility];  Service: Endoscopy;;  gastric  . BIOPSY  04/15/2020   Procedure: BIOPSY;  Surgeon: Daneil Dolin, MD;  [facility];  Service: Endoscopy;;  gastric  . CARDIAC CATHETERIZATION  08/2020   3 stents placed  . COLON RESECTION  1990s?   DIVERTICULITIS  . COLONOSCOPY WITH PROPOFOL N/A 04/15/2020   Rourk: Poor colon prep. two 6 to 8 mm polyps removed from the hepatic flexure, tubular adenomas.  Repeat colonoscopy later this year.  . ESOPHAGOGASTRODUODENOSCOPY (EGD) WITH PROPOFOL N/A 10/28/2019   Dr. Oneida Alar: Multiple cratered gastric ulcers, gastritis/duodenitis.  Path showed H. pylori.  Patient treated with amoxicillin/Biaxin/PPI twice daily.  . ESOPHAGOGASTRODUODENOSCOPY (EGD) WITH PROPOFOL N/A 04/15/2020   Rourk: Patchy gastric erythema status post biopsy to document eradication of H. pylori.  Biopsy showed persistent H. pylori.  Previously noted gastric ulcer is completely healed.  . 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  Marland Kitchen HERNIA REPAIR     UHR  . MALONEY DILATION N/A 04/15/2020   Procedure: Venia Minks DILATION;  Surgeon: Daneil Dolin, MD;  [facility];  Service: Endoscopy;  Laterality: N/A;  . POLYPECTOMY  04/15/2020   Procedure: POLYPECTOMY;  Surgeon: Daneil Dolin, MD;   [facility];  Service: Endoscopy;;  colon  . ROTATOR CUFF REPAIR Bilateral   . TYMPANOPLASTY     Family History  Problem Relation Age of Onset  . Diabetes Mother   . Heart attack Mother   . Heart attack Father   . Heart attack Brother   . Colon cancer Neg Hx   . Stomach cancer Neg Hx    Social History   Tobacco Use  . Smoking status: Current Every Day Smoker    Packs/day: 2.00    Types: Cigarettes  . Smokeless tobacco: Never Used  Vaping Use  . Vaping Use: Never used  Substance Use Topics  .  Alcohol use: Yes    Comment: very rare  . Drug use: No     ROS:  General: Negative for anorexia, weight loss, fever, chills, fatigue, weakness. ENT: Negative for hoarseness, difficulty swallowing , nasal congestion. CV: Negative for chest pain, angina, palpitations, +dyspnea on exertion, peripheral edema.  Respiratory: Negative for dyspnea at rest, +dyspnea on exertion, cough, sputum, wheezing.  GI: See history of present illness. GU:  Negative for dysuria, hematuria, urinary incontinence, urinary frequency, nocturnal urination.  Endo: Negative for unusual weight change.    Physical Examination:   BP 128/61   Pulse 71   Temp 97.7 F (36.5 C)   Ht 6' (1.829 m)   Wt 284 lb (128.8 kg)   BMI 38.52 kg/m   General: Well-nourished, well-developed in no acute distress. Chronically ill appearing. Eyes: No icterus. Mouth: masked. Lungs: Clear to auscultation bilaterally.  Heart: Regular rate and rhythm, no murmurs rubs or gallops.  Abdomen: Bowel sounds are normal, nontender, nondistended, no hepatosplenomegaly or masses, no abdominal bruits or hernia , no rebound or guarding.   Extremities: right lower extremity > than left. States it is chronic. No clubbing or deformities. Neuro: Alert and oriented x 4   Skin: Warm and dry, no jaundice.   Psych: Alert and cooperative, normal mood and affect.  Labs:  Lab Results  Component Value Date   CREATININE 1.05 01/07/2021    BUN 16 01/07/2021   NA 136 01/07/2021   K 3.5 01/07/2021   CL 99 01/07/2021   CO2 26 01/07/2021   Lab Results  Component Value Date   ALT 9 01/06/2021   AST 11 (L) 01/06/2021   ALKPHOS 81 01/06/2021   BILITOT 0.6 01/06/2021   Lab Results  Component Value Date   WBC 12.0 (H) 01/07/2021   HGB 7.5 (L) 01/07/2021   HCT 27.8 (L) 01/07/2021   MCV 74.7 (L) 01/07/2021   PLT 250 01/07/2021   Lab Results  Component Value Date   IRON 13 (L) 01/06/2021   TIBC 523 (H) 01/06/2021   FERRITIN 6 (L) 01/06/2021   Lab Results  Component Value Date   FOLATE 10.8 01/06/2021   Lab Results  Component Value Date   VITAMINB12 390 01/06/2021     Imaging Studies: DG Chest 2 View  Result Date: 01/06/2021 CLINICAL DATA:  Weakness. EXAM: CHEST - 2 VIEW COMPARISON:  July 22, 2020. FINDINGS: The heart size and mediastinal contours are within normal limits. Both lungs are clear. The visualized skeletal structures are unremarkable. IMPRESSION: No active cardiopulmonary disease. Electronically Signed   By: Marijo Conception M.D.   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   IDA/GI bleeding: H/O small bowel AVMS,. Prior PUD with documented healing. Denies NSAIDs. Patient is due for updated labs. Hgb 7.5 at time of discharge last month. Continues to see black stool, ?related to iron. Complains of DOE. He is high risk of ongoing occult gi bleeding given AVMs and ASA and now on Effient (since stents 08/2020). Will need close monitoring. He is not interested in pursuing IV iron due to expense. He will need updated colonoscopy given prior incomplete study but recent NSTEMI with cath and 3 stents (08/2020).  H.pylori: will need to document eradication in near future.   COPD: has been severe with need for intubation  RLE swelling>LLE: patient states it  is  chronic. Refuses work up.   Plan: 1. Update CBC. Patient states he will do with PCP.  2. Urged him to consider doppler u/s of LE to rule out DVT, he declined. ED precautions advised.  3. Will work towards h.pylori eradication testing and colonoscopy in the near future if patient is agreeable.

## 2021-01-22 NOTE — Patient Instructions (Signed)
1. Please have your labs done today. If you don't hear from [place] within 2 business days, please call and ask for results. 2. I have recommended an ultrasound of your right leg to rule out blood clot. If you decide to agree to this test, please let me know. 3. Go to the ER if your breathing gets worse, your legs become more painful or more swollen, or you become weaker.

## 2021-01-23 DIAGNOSIS — I5022 Chronic systolic (congestive) heart failure: Secondary | ICD-10-CM | POA: Diagnosis not present

## 2021-01-23 DIAGNOSIS — Q2733 Arteriovenous malformation of digestive system vessel: Secondary | ICD-10-CM | POA: Diagnosis not present

## 2021-01-23 DIAGNOSIS — D72829 Elevated white blood cell count, unspecified: Secondary | ICD-10-CM | POA: Diagnosis not present

## 2021-01-24 ENCOUNTER — Encounter (HOSPITAL_COMMUNITY)
Admission: RE | Admit: 2021-01-24 | Discharge: 2021-01-24 | Disposition: A | Discharge status: Home / Self Care | Payer: Medicare Other | Source: Ambulatory Visit | Attending: Cardiology | Admitting: Cardiology

## 2021-01-26 ENCOUNTER — Encounter: Payer: Self-pay | Admitting: Gastroenterology

## 2021-01-27 ENCOUNTER — Encounter (HOSPITAL_COMMUNITY): Payer: Medicare Other

## 2021-01-29 ENCOUNTER — Encounter (HOSPITAL_COMMUNITY): Payer: Medicare Other

## 2021-01-29 ENCOUNTER — Telehealth: Payer: Self-pay

## 2021-01-29 NOTE — Telephone Encounter (Signed)
Good Morning,   The bloodwork for this pt came and I put it beside your keyboard face down.   Dena

## 2021-01-31 ENCOUNTER — Encounter (HOSPITAL_COMMUNITY): Payer: Medicare Other

## 2021-02-03 ENCOUNTER — Encounter (HOSPITAL_COMMUNITY): Payer: Medicare Other

## 2021-02-03 DIAGNOSIS — J449 Chronic obstructive pulmonary disease, unspecified: Secondary | ICD-10-CM | POA: Diagnosis not present

## 2021-02-04 NOTE — Telephone Encounter (Signed)
Labs dated 01/23/2021:  White blood cell count 12,400, hemoglobin 11.6, hematocrit 39.4, MCV 80, platelets 361,000.  1. Please let pt know I have reviewed his labs. H/H much improved. Does not explain his dyspnea on exertion. Likely primarily related to his lung disease.  2. If he still has more swelling in his right leg than his left leg as seen at recent OV, I would advise ruling out blood clot with ultrasound. If breathing is worse, then should go to ER.  3. We will need to move toward colonoscopy later this year, but he is on Effient and has had stents placed in 08/2020. He will need cardiac clearance first. Please make arrangements. 4. We need to document h.pylori eradication. Please order H. pylori stool antigen. Have him hold pantoprazole, pepto, for two weeks. No antibiotics for two weeks.

## 2021-02-04 NOTE — Telephone Encounter (Signed)
Phoned and advised the pt of his lab results and advised that his dyspnea was likely due to his lung disease. Pt states his right leg has gone down a lot to where it doesn't bother him. I asked about his breathing and he advised me that it was about the same. I advised the pt of his breathing recommendations. He stated his breathing is as before I asked him to explain that to me and once again he said as before, so I told him to go to the ER if he gets worse. Advised of the colonoscopy and the pt stated "no" he is not doing it so there is no need of me contacting his cardiologist. I advised of the need to document H. Pylori from his system and he refused the H. Pylori stool Antigen too. Pt also advised he isn't taking PPI meds. Thanked [place] for our time and that was the end of the conversation.

## 2021-02-05 ENCOUNTER — Encounter (HOSPITAL_COMMUNITY): Payer: Medicare Other

## 2021-02-06 NOTE — Telephone Encounter (Signed)
noted 

## 2021-02-06 NOTE — Telephone Encounter (Signed)
Patient's refusal to follow recommendations noted. No further recommendations.

## 2021-02-07 ENCOUNTER — Encounter (HOSPITAL_COMMUNITY): Payer: Medicare Other

## 2021-02-25 DIAGNOSIS — M79605 Pain in left leg: Secondary | ICD-10-CM | POA: Diagnosis not present

## 2021-02-25 DIAGNOSIS — E1169 Type 2 diabetes mellitus with other specified complication: Secondary | ICD-10-CM | POA: Diagnosis not present

## 2021-02-25 DIAGNOSIS — E782 Mixed hyperlipidemia: Secondary | ICD-10-CM | POA: Diagnosis not present

## 2021-02-25 DIAGNOSIS — J449 Chronic obstructive pulmonary disease, unspecified: Secondary | ICD-10-CM | POA: Diagnosis not present

## 2021-02-25 DIAGNOSIS — I10 Essential (primary) hypertension: Secondary | ICD-10-CM | POA: Diagnosis not present

## 2021-02-25 DIAGNOSIS — M79604 Pain in right leg: Secondary | ICD-10-CM | POA: Diagnosis not present

## 2021-02-25 DIAGNOSIS — D72829 Elevated white blood cell count, unspecified: Secondary | ICD-10-CM | POA: Diagnosis not present

## 2021-02-25 DIAGNOSIS — I5022 Chronic systolic (congestive) heart failure: Secondary | ICD-10-CM | POA: Diagnosis not present

## 2021-02-25 DIAGNOSIS — D509 Iron deficiency anemia, unspecified: Secondary | ICD-10-CM | POA: Diagnosis not present

## 2021-02-25 DIAGNOSIS — M545 Low back pain, unspecified: Secondary | ICD-10-CM | POA: Diagnosis not present

## 2021-02-25 DIAGNOSIS — I2510 Atherosclerotic heart disease of native coronary artery without angina pectoris: Secondary | ICD-10-CM | POA: Diagnosis not present

## 2021-02-25 DIAGNOSIS — F17200 Nicotine dependence, unspecified, uncomplicated: Secondary | ICD-10-CM | POA: Diagnosis not present

## 2021-03-06 DIAGNOSIS — J449 Chronic obstructive pulmonary disease, unspecified: Secondary | ICD-10-CM | POA: Diagnosis not present

## 2021-03-11 DIAGNOSIS — D509 Iron deficiency anemia, unspecified: Secondary | ICD-10-CM | POA: Diagnosis not present

## 2021-03-11 DIAGNOSIS — I10 Essential (primary) hypertension: Secondary | ICD-10-CM | POA: Diagnosis not present

## 2021-03-11 DIAGNOSIS — E1169 Type 2 diabetes mellitus with other specified complication: Secondary | ICD-10-CM | POA: Diagnosis not present

## 2021-03-28 DIAGNOSIS — M545 Low back pain, unspecified: Secondary | ICD-10-CM | POA: Diagnosis not present

## 2021-03-28 DIAGNOSIS — E782 Mixed hyperlipidemia: Secondary | ICD-10-CM | POA: Diagnosis not present

## 2021-03-28 DIAGNOSIS — E1169 Type 2 diabetes mellitus with other specified complication: Secondary | ICD-10-CM | POA: Diagnosis not present

## 2021-03-28 DIAGNOSIS — D509 Iron deficiency anemia, unspecified: Secondary | ICD-10-CM | POA: Diagnosis not present

## 2021-03-28 DIAGNOSIS — M79605 Pain in left leg: Secondary | ICD-10-CM | POA: Diagnosis not present

## 2021-03-28 DIAGNOSIS — I2510 Atherosclerotic heart disease of native coronary artery without angina pectoris: Secondary | ICD-10-CM | POA: Diagnosis not present

## 2021-03-28 DIAGNOSIS — D72829 Elevated white blood cell count, unspecified: Secondary | ICD-10-CM | POA: Diagnosis not present

## 2021-03-28 DIAGNOSIS — M79604 Pain in right leg: Secondary | ICD-10-CM | POA: Diagnosis not present

## 2021-03-28 DIAGNOSIS — J449 Chronic obstructive pulmonary disease, unspecified: Secondary | ICD-10-CM | POA: Diagnosis not present

## 2021-03-28 DIAGNOSIS — I10 Essential (primary) hypertension: Secondary | ICD-10-CM | POA: Diagnosis not present

## 2021-03-28 DIAGNOSIS — I5022 Chronic systolic (congestive) heart failure: Secondary | ICD-10-CM | POA: Diagnosis not present

## 2021-03-28 DIAGNOSIS — F17200 Nicotine dependence, unspecified, uncomplicated: Secondary | ICD-10-CM | POA: Diagnosis not present

## 2021-04-04 ENCOUNTER — Ambulatory Visit: Payer: Medicare Other | Admitting: Cardiology

## 2021-04-04 ENCOUNTER — Encounter: Payer: Self-pay | Admitting: *Deleted

## 2021-04-04 ENCOUNTER — Other Ambulatory Visit: Payer: Self-pay

## 2021-04-04 ENCOUNTER — Encounter: Payer: Self-pay | Admitting: Cardiology

## 2021-04-04 VITALS — BP 134/76 | HR 82 | Ht 72.0 in | Wt 284.0 lb

## 2021-04-04 DIAGNOSIS — I5022 Chronic systolic (congestive) heart failure: Secondary | ICD-10-CM | POA: Diagnosis not present

## 2021-04-04 DIAGNOSIS — I2510 Atherosclerotic heart disease of native coronary artery without angina pectoris: Secondary | ICD-10-CM

## 2021-04-04 NOTE — Progress Notes (Signed)
Clinical Summary Gregory Alvarado is a 70 y.o.male seen today for a focused visit for history of CAD and chronic sysotlic HF   1. CAD/ICM/Chronic systolic HF - from notes initially chest pain and dyspnea, became unresponsive en route to ED - intubated in ER. Notes mention issues with COPD exacerbation vs aspiration event, no mention of a cardiac arrest - diagnosed with NSTEMI during admission, had cath and 3 stents. - admitted 12/29 to Jan 9   09/12/20 cath: LM LIs, LAD prox 70% and 90% mid, LCX 80% prox with thrombus, OM2 85%, RCA small non dom 60%. LVEDP elevated but number not given, - received DES to prox/mid LAD, DES to prox LCX, DES to OM2. 08/2020 echo: LVEF 35-40% grade II DDx, mild MR       12/2020 echo: LVEF 60-65%, no WMAs, indet diastolic, normal RV - no chest pains. Chronic SOB unchanged - compliant with meds     2. COPD - severe COPD exacerbation during jan 2022 admission in [place] requiring intubation       3. LE edema - cramps on both daily lasix and torsemide, even with low dosing and every other day dosing. - controlled with lasix, takes the lasix prn about every other day.   4. Anemia - admission 12/2020, Hgb was 6.4 - transfused 2 units  Past Medical History:  Diagnosis Date   Anemia    Arthritis    left shoulder   COPD (chronic obstructive pulmonary disease) ([place])    Coronary artery disease 09/11/2020   NSTEMI and 3 stents in [facility]   Diabetes mellitus without complication ([place])    Dyslipidemia    Hypertension    Insomnia    Insomnia    Morbid obesity ([facility])    Tobacco use      Allergies  Allergen Reactions   Caduet [Amlodipine-Atorvastatin] Swelling     Current Outpatient Medications  Medication Sig Dispense Refill   acetaminophen (TYLENOL) 325 MG tablet Take 2 tablets (650 mg total) by mouth every 6 (six) hours as needed for mild pain (or Fever >/= 101). 12 tablet 0   aspirin EC 81 MG tablet Take 81 mg by mouth daily.  Swallow whole.     atorvastatin (LIPITOR) 40 MG tablet Take 40 mg by mouth at bedtime.     cyanocobalamin (,VITAMIN B-12,) 1000 MCG/ML injection Inject 1,000 mcg into the muscle every 30 (thirty) days.      FERREX 150 150 MG capsule Take 150 mg by mouth daily.     folic acid (FOLVITE) 1 MG tablet Take 1 tablet (1 mg total) by mouth daily. (Patient taking differently: Take 1 mg by mouth at bedtime.) 30 tablet 2   furosemide (LASIX) 40 MG tablet Take 1 tablet (40 mg total) by mouth daily as needed. 90 tablet 1   gabapentin (NEURONTIN) 300 MG capsule Take 1 capsule by mouth at bedtime.     hydrochlorothiazide (HYDRODIURIL) 12.5 MG tablet Take 12.5 mg by mouth at bedtime.      insulin NPH-regular Human (NOVOLIN 70/30) (70-30) 100 UNIT/ML injection Inject 40 Units into the skin in the morning and at bedtime. Inject 40 units in morning and 40 units every evening     isosorbide mononitrate (IMDUR) 30 MG 24 hr tablet Take 1 tablet (30 mg total) by mouth daily. May take 2 if needed 180 tablet 3   lisinopril (PRINIVIL,ZESTRIL) 20 MG tablet Take 1 tablet (20 mg total) by mouth daily. 30 tablet  6   metoprolol succinate (TOPROL XL) 100 MG 24 hr tablet Take 1.5 tablets (150 mg total) by mouth daily. Take with or immediately following a meal. 180 tablet 3   nitroGLYCERIN (NITROSTAT) 0.4 MG SL tablet DISSOLVE 1 TABLET SUBLINGUALLY AS NEEDED FOR CHEST PAIN, MAY REPEAT EVERY 5 MINUTES. AFTER 3 CALL 911. 25 tablet 3   pantoprazole (PROTONIX) 40 MG tablet TAKE ONE TABLET BY MOUTH 2 TIMES A DAY BEFORE A MEAL 60 tablet 5   prasugrel (EFFIENT) 10 MG TABS tablet Take 10 mg by mouth daily.     senna (SENOKOT) 8.6 MG TABS tablet Take 1 tablet (8.6 mg total) by mouth daily. 120 tablet 0   SPIRIVA HANDIHALER 18 MCG inhalation capsule Place 1 capsule into inhaler and inhale daily.     spironolactone (ALDACTONE) 25 MG tablet TAKE 1 TABLET BY MOUTH ONCE A DAY. 30 tablet 11   SUMAtriptan (IMITREX) 25 MG tablet Take 25 mg by mouth  every 2 (two) hours as needed.     SYMBICORT 160-4.5 MCG/ACT inhaler Inhale 2 puffs into the lungs 2 (two) times daily.     traMADol (ULTRAM) 50 MG tablet Take 50-100 mg by mouth in the morning and at bedtime.     zolpidem (AMBIEN) 5 MG tablet Take 5 mg by mouth at bedtime as needed.     No current facility-administered medications for this visit.     Past Surgical History:  Procedure Laterality Date   BIOPSY  10/28/2019   Procedure: BIOPSY;  Surgeon: Danie Binder, MD;  [facility];  Service: Endoscopy;;  gastric   BIOPSY  04/15/2020   Procedure: BIOPSY;  Surgeon: Daneil Dolin, MD;  [facility];  Service: Endoscopy;;  gastric   CARDIAC CATHETERIZATION  08/2020   3 stents placed   COLON RESECTION  1990s?   DIVERTICULITIS   COLONOSCOPY WITH PROPOFOL N/A 04/15/2020   Rourk: Poor colon prep. two 6 to 8 mm polyps removed from the hepatic flexure, tubular adenomas.  Repeat colonoscopy later this year.   ESOPHAGOGASTRODUODENOSCOPY (EGD) WITH PROPOFOL N/A 10/28/2019   Dr. Oneida Alar: Multiple cratered gastric ulcers, gastritis/duodenitis.  Path showed H. pylori.  Patient treated with amoxicillin/Biaxin/PPI twice daily.   ESOPHAGOGASTRODUODENOSCOPY (EGD) WITH PROPOFOL N/A 04/15/2020   Rourk: Patchy gastric erythema status post biopsy to document eradication of H. pylori.  Biopsy showed persistent H. pylori.  Previously noted gastric ulcer is completely healed.   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   HERNIA REPAIR     UHR   MALONEY DILATION N/A 04/15/2020   Procedure: Venia Minks DILATION;  Surgeon: Daneil Dolin, MD;  [facility];  Service: Endoscopy;  Laterality: N/A;   POLYPECTOMY  04/15/2020   Procedure: POLYPECTOMY;  Surgeon: Daneil Dolin, MD;  [facility];  Service: Endoscopy;;  colon   ROTATOR CUFF REPAIR Bilateral    TYMPANOPLASTY        Allergies  Allergen Reactions   Caduet [Amlodipine-Atorvastatin] Swelling      Family History  Problem Relation Age of Onset   Diabetes Mother    Heart attack Mother    Heart attack Father    Heart attack Brother    Colon cancer Neg Hx    Stomach cancer Neg Hx      Social History Mr. Sheppard reports that he has been smoking cigarettes. He has been smoking  an average of 2 packs per day. He has never used smokeless tobacco. Mr. Libby reports current alcohol use.   Review of Systems CONSTITUTIONAL: No weight loss, fever, chills, weakness or fatigue.  HEENT: Eyes: No visual loss, blurred vision, double vision or yellow sclerae.No hearing loss, sneezing, congestion, runny nose or sore throat.  SKIN: No rash or itching.  CARDIOVASCULAR: per hpi RESPIRATORY: No shortness of breath, cough or sputum.  GASTROINTESTINAL: No anorexia, nausea, vomiting or diarrhea. No abdominal pain or blood.  GENITOURINARY: No burning on urination, no polyuria NEUROLOGICAL: No headache, dizziness, syncope, paralysis, ataxia, numbness or tingling in the extremities. No change in bowel or bladder control.  MUSCULOSKELETAL: No muscle, back pain, joint pain or stiffness.  LYMPHATICS: No enlarged nodes. No history of splenectomy.  PSYCHIATRIC: No history of depression or anxiety.  ENDOCRINOLOGIC: No reports of sweating, cold or heat intolerance. No polyuria or polydipsia.  Marland Kitchen   Physical Examination Today's Vitals   04/04/21 0821  BP: 134/76  Pulse: 82  SpO2: 95%  Weight: 284 lb (128.8 kg)  Height: 6' (1.829 m)   Body mass index is 38.52 kg/m.  Gen: resting comfortably, no acute distress HEENT: no scleral icterus, pupils equal round and reactive, no palptable cervical adenopathy,  CV:RRR, no m/r/g, no jvd  Resp: Clear to auscultation bilaterally GI: abdomen is soft, non-tender, non-distended, normal bowel sounds, no hepatosplenomegaly MSK: extremities are warm, no edema.  Skin: warm, no  rash Neuro:  no focal deficits Psych: appropriate affect   Diagnostic Studies 08/2018 echo Study Conclusions   - Left ventricle: The cavity size was normal. Wall thickness was   increased increased in a pattern of mild to moderate LVH.   Systolic function was mildly to moderately reduced. The estimated   ejection fraction was in the range of 40% to 45%. Diffuse   hypokinesis. Doppler parameters are consistent with abnormal left   ventricular relaxation (grade 1 diastolic dysfunction). Doppler   parameters are consistent with high ventricular filling pressure. - Aortic valve: Valve area (VTI): 3.22 cm^2. Valve area (Vmax):   2.74 cm^2. Valve area (Vmean): 2.29 cm^2. - Left atrium: The atrium was moderately dilated. - Atrial septum: No defect or patent foramen ovale was identified. - Techncically difficult study, recommend limited study with   echocontrast to better clarify LVEF. Based on available images   appears mildly decreased.   08/2018 contrast echo Study Conclusions   - Left ventricle: Limited study with Definity contrast. Systolic   function was normal. The estimated ejection fraction was in the   range of 55% to 60%. Wall motion was normal; there were no   regional wall motion abnormalities. - Mitral valve: There was trivial regurgitation. - Pericardium, extracardiac: A prominent pericardial fat pad was   present.   07/2020 echo IMPRESSIONS     1. Left ventricular ejection fraction, by estimation, is 55 to 60%. The  left ventricle has normal function. The left ventricle has no regional  wall motion abnormalities. There is moderate left ventricular hypertrophy.  Left ventricular diastolic  parameters are indeterminate.   2. Right ventricular systolic function is normal. The right ventricular  size is normal.   3. The mitral valve is normal in structure. Mild mitral valve  regurgitation. No evidence of mitral stenosis.   4. The aortic valve is tricuspid. There is  mild calcification of the  aortic valve. There is mild thickening of the aortic valve. Aortic valve  regurgitation is not visualized. No aortic stenosis is  present.   5. The inferior vena cava is normal in size with greater than 50%  respiratory variability, suggesting right atrial pressure of 3 mmHg.    12/2020 echo IMPRESSIONS     1. Left ventricular ejection fraction, by estimation, is 60 to 65%. The  left ventricle has normal function. The left ventricle has no regional  wall motion abnormalities. There is mild left ventricular hypertrophy.  Left ventricular diastolic parameters  are indeterminate. Elevated left atrial pressure.   2. Right ventricular systolic function is normal. The right ventricular  size is normal.   3. Left atrial size was severely dilated.   4. Right atrial size was mildly dilated.   5. The mitral valve is normal in structure. Mild mitral valve  regurgitation. No evidence of mitral stenosis.   6. The aortic valve is tricuspid. Aortic valve regurgitation is not  visualized. No aortic stenosis is present.   7. The inferior vena cava is dilated in size with >50% respiratory  variability, suggesting right atrial pressure of 8 mmHg.   Assessment and Plan   1. CAD/ICM/Chronic systolic HF - no recent symptoms - LVEF has normalized by most recent echo, continue current medical therapy - continue DAPT, can consider stopping effient at 1 year       Arnoldo Lenis, M.D.

## 2021-04-04 NOTE — Patient Instructions (Signed)
Medication Instructions:  Your physician recommends that you continue on your current medications as directed. Please refer to the Current Medication list given to you today.  *If you need a refill on your cardiac medications before your next appointment, please call your pharmacy*   Lab Work: NONE   If you have labs (blood work) drawn today and your tests are completely normal, you will receive your results only by: . MyChart Message (if you have MyChart) OR . A paper copy in the mail If you have any lab test that is abnormal or we need to change your treatment, we will call you to review the results.   Testing/Procedures: NONE    Follow-Up: At [facility], you and your health needs are our priority.  As part of our continuing mission to provide you with exceptional heart care, we have created designated Provider Care Teams.  These Care Teams include your primary Cardiologist (physician) and Advanced Practice Providers (APPs -  Physician Assistants and Nurse Practitioners) who all work together to provide you with the care you need, when you need it.  We recommend signing up for the patient portal called "MyChart".  Sign up information is provided on this After Visit Summary.  MyChart is used to connect with patients for Virtual Visits (Telemedicine).  Patients are able to view lab/test results, encounter notes, upcoming appointments, etc.  Non-urgent messages can be sent to your provider as well.   To learn more about what you can do with MyChart, go to [email or web address].    Your next appointment:   6 month(s)  The format for your next appointment:   In Person  Provider:   Jonathan Branch, MD   Other Instructions Thank you for choosing [facility]!    

## 2021-04-05 DIAGNOSIS — J449 Chronic obstructive pulmonary disease, unspecified: Secondary | ICD-10-CM | POA: Diagnosis not present

## 2021-05-06 DIAGNOSIS — J449 Chronic obstructive pulmonary disease, unspecified: Secondary | ICD-10-CM | POA: Diagnosis not present

## 2021-05-14 DIAGNOSIS — I10 Essential (primary) hypertension: Secondary | ICD-10-CM | POA: Diagnosis not present

## 2021-05-14 DIAGNOSIS — E785 Hyperlipidemia, unspecified: Secondary | ICD-10-CM | POA: Diagnosis not present

## 2021-05-23 ENCOUNTER — Other Ambulatory Visit: Payer: Self-pay | Admitting: Cardiology

## 2021-06-06 DIAGNOSIS — J449 Chronic obstructive pulmonary disease, unspecified: Secondary | ICD-10-CM | POA: Diagnosis not present

## 2021-06-26 ENCOUNTER — Other Ambulatory Visit: Payer: Self-pay | Admitting: Cardiology

## 2021-07-06 DIAGNOSIS — J449 Chronic obstructive pulmonary disease, unspecified: Secondary | ICD-10-CM | POA: Diagnosis not present

## 2021-07-11 DIAGNOSIS — D509 Iron deficiency anemia, unspecified: Secondary | ICD-10-CM | POA: Diagnosis not present

## 2021-07-11 DIAGNOSIS — I10 Essential (primary) hypertension: Secondary | ICD-10-CM | POA: Diagnosis not present

## 2021-07-11 DIAGNOSIS — E1169 Type 2 diabetes mellitus with other specified complication: Secondary | ICD-10-CM | POA: Diagnosis not present

## 2021-07-14 DIAGNOSIS — E785 Hyperlipidemia, unspecified: Secondary | ICD-10-CM | POA: Diagnosis not present

## 2021-07-14 DIAGNOSIS — I10 Essential (primary) hypertension: Secondary | ICD-10-CM | POA: Diagnosis not present

## 2021-07-17 DIAGNOSIS — I10 Essential (primary) hypertension: Secondary | ICD-10-CM | POA: Diagnosis not present

## 2021-07-17 DIAGNOSIS — I5022 Chronic systolic (congestive) heart failure: Secondary | ICD-10-CM | POA: Diagnosis not present

## 2021-07-17 DIAGNOSIS — M79605 Pain in left leg: Secondary | ICD-10-CM | POA: Diagnosis not present

## 2021-07-17 DIAGNOSIS — E782 Mixed hyperlipidemia: Secondary | ICD-10-CM | POA: Diagnosis not present

## 2021-07-17 DIAGNOSIS — D72829 Elevated white blood cell count, unspecified: Secondary | ICD-10-CM | POA: Diagnosis not present

## 2021-07-17 DIAGNOSIS — F17200 Nicotine dependence, unspecified, uncomplicated: Secondary | ICD-10-CM | POA: Diagnosis not present

## 2021-07-17 DIAGNOSIS — I2510 Atherosclerotic heart disease of native coronary artery without angina pectoris: Secondary | ICD-10-CM | POA: Diagnosis not present

## 2021-07-17 DIAGNOSIS — D509 Iron deficiency anemia, unspecified: Secondary | ICD-10-CM | POA: Diagnosis not present

## 2021-07-17 DIAGNOSIS — M79604 Pain in right leg: Secondary | ICD-10-CM | POA: Diagnosis not present

## 2021-07-17 DIAGNOSIS — J449 Chronic obstructive pulmonary disease, unspecified: Secondary | ICD-10-CM | POA: Diagnosis not present

## 2021-07-17 DIAGNOSIS — E1169 Type 2 diabetes mellitus with other specified complication: Secondary | ICD-10-CM | POA: Diagnosis not present

## 2021-07-17 DIAGNOSIS — Z0001 Encounter for general adult medical examination with abnormal findings: Secondary | ICD-10-CM | POA: Diagnosis not present

## 2021-07-22 ENCOUNTER — Other Ambulatory Visit: Payer: Self-pay | Admitting: Gastroenterology

## 2021-08-06 DIAGNOSIS — J449 Chronic obstructive pulmonary disease, unspecified: Secondary | ICD-10-CM | POA: Diagnosis not present

## 2021-08-13 DIAGNOSIS — I10 Essential (primary) hypertension: Secondary | ICD-10-CM | POA: Diagnosis not present

## 2021-08-13 DIAGNOSIS — E785 Hyperlipidemia, unspecified: Secondary | ICD-10-CM | POA: Diagnosis not present

## 2021-08-28 ENCOUNTER — Inpatient Hospital Stay (HOSPITAL_COMMUNITY)
Admission: EM | Admit: 2021-08-28 | Discharge: 2021-08-31 | DRG: 291 | Disposition: A | Discharge status: Home / Self Care | Payer: Medicare Other | Attending: Internal Medicine | Admitting: Internal Medicine

## 2021-08-28 ENCOUNTER — Inpatient Hospital Stay (HOSPITAL_COMMUNITY): Payer: Medicare Other

## 2021-08-28 ENCOUNTER — Emergency Department (HOSPITAL_COMMUNITY): Payer: Medicare Other

## 2021-08-28 DIAGNOSIS — F17210 Nicotine dependence, cigarettes, uncomplicated: Secondary | ICD-10-CM | POA: Diagnosis not present

## 2021-08-28 DIAGNOSIS — I5033 Acute on chronic diastolic (congestive) heart failure: Secondary | ICD-10-CM | POA: Diagnosis not present

## 2021-08-28 DIAGNOSIS — Z6839 Body mass index (BMI) 39.0-39.9, adult: Secondary | ICD-10-CM | POA: Diagnosis not present

## 2021-08-28 DIAGNOSIS — Z8249 Family history of ischemic heart disease and other diseases of the circulatory system: Secondary | ICD-10-CM

## 2021-08-28 DIAGNOSIS — J9622 Acute and chronic respiratory failure with hypercapnia: Secondary | ICD-10-CM | POA: Diagnosis not present

## 2021-08-28 DIAGNOSIS — Z955 Presence of coronary angioplasty implant and graft: Secondary | ICD-10-CM

## 2021-08-28 DIAGNOSIS — E669 Obesity, unspecified: Secondary | ICD-10-CM | POA: Diagnosis present

## 2021-08-28 DIAGNOSIS — R4020 Unspecified coma: Secondary | ICD-10-CM | POA: Diagnosis not present

## 2021-08-28 DIAGNOSIS — Z794 Long term (current) use of insulin: Secondary | ICD-10-CM

## 2021-08-28 DIAGNOSIS — N182 Chronic kidney disease, stage 2 (mild): Secondary | ICD-10-CM

## 2021-08-28 DIAGNOSIS — I252 Old myocardial infarction: Secondary | ICD-10-CM

## 2021-08-28 DIAGNOSIS — I5043 Acute on chronic combined systolic (congestive) and diastolic (congestive) heart failure: Secondary | ICD-10-CM

## 2021-08-28 DIAGNOSIS — Z79899 Other long term (current) drug therapy: Secondary | ICD-10-CM

## 2021-08-28 DIAGNOSIS — E119 Type 2 diabetes mellitus without complications: Secondary | ICD-10-CM | POA: Diagnosis not present

## 2021-08-28 DIAGNOSIS — J9621 Acute and chronic respiratory failure with hypoxia: Secondary | ICD-10-CM | POA: Diagnosis not present

## 2021-08-28 DIAGNOSIS — M19012 Primary osteoarthritis, left shoulder: Secondary | ICD-10-CM | POA: Diagnosis not present

## 2021-08-28 DIAGNOSIS — Z9114 Patient's other noncompliance with medication regimen: Secondary | ICD-10-CM | POA: Diagnosis not present

## 2021-08-28 DIAGNOSIS — J441 Chronic obstructive pulmonary disease with (acute) exacerbation: Secondary | ICD-10-CM | POA: Diagnosis present

## 2021-08-28 DIAGNOSIS — E785 Hyperlipidemia, unspecified: Secondary | ICD-10-CM | POA: Diagnosis present

## 2021-08-28 DIAGNOSIS — Z7982 Long term (current) use of aspirin: Secondary | ICD-10-CM

## 2021-08-28 DIAGNOSIS — D509 Iron deficiency anemia, unspecified: Secondary | ICD-10-CM

## 2021-08-28 DIAGNOSIS — Z743 Need for continuous supervision: Secondary | ICD-10-CM | POA: Diagnosis not present

## 2021-08-28 DIAGNOSIS — I2510 Atherosclerotic heart disease of native coronary artery without angina pectoris: Secondary | ICD-10-CM | POA: Diagnosis not present

## 2021-08-28 DIAGNOSIS — Z7951 Long term (current) use of inhaled steroids: Secondary | ICD-10-CM | POA: Diagnosis not present

## 2021-08-28 DIAGNOSIS — Z833 Family history of diabetes mellitus: Secondary | ICD-10-CM

## 2021-08-28 DIAGNOSIS — R0602 Shortness of breath: Secondary | ICD-10-CM | POA: Diagnosis not present

## 2021-08-28 DIAGNOSIS — R0600 Dyspnea, unspecified: Secondary | ICD-10-CM | POA: Diagnosis not present

## 2021-08-28 DIAGNOSIS — I10 Essential (primary) hypertension: Secondary | ICD-10-CM | POA: Diagnosis not present

## 2021-08-28 DIAGNOSIS — Z20822 Contact with and (suspected) exposure to covid-19: Secondary | ICD-10-CM | POA: Diagnosis present

## 2021-08-28 DIAGNOSIS — E6609 Other obesity due to excess calories: Secondary | ICD-10-CM

## 2021-08-28 DIAGNOSIS — R404 Transient alteration of awareness: Secondary | ICD-10-CM | POA: Diagnosis not present

## 2021-08-28 DIAGNOSIS — I110 Hypertensive heart disease with heart failure: Principal | ICD-10-CM | POA: Diagnosis present

## 2021-08-28 DIAGNOSIS — T501X6A Underdosing of loop [high-ceiling] diuretics, initial encounter: Secondary | ICD-10-CM | POA: Diagnosis not present

## 2021-08-28 DIAGNOSIS — E66812 Obesity, class 2: Secondary | ICD-10-CM

## 2021-08-28 DIAGNOSIS — R6889 Other general symptoms and signs: Secondary | ICD-10-CM | POA: Diagnosis not present

## 2021-08-28 DIAGNOSIS — I248 Other forms of acute ischemic heart disease: Secondary | ICD-10-CM | POA: Diagnosis not present

## 2021-08-28 DIAGNOSIS — N2889 Other specified disorders of kidney and ureter: Secondary | ICD-10-CM | POA: Diagnosis present

## 2021-08-28 DIAGNOSIS — Z888 Allergy status to other drugs, medicaments and biological substances status: Secondary | ICD-10-CM

## 2021-08-28 DIAGNOSIS — Z9981 Dependence on supplemental oxygen: Secondary | ICD-10-CM

## 2021-08-28 LAB — BLOOD GAS, ARTERIAL
Acid-Base Excess: 10.5 mmol/L — ABNORMAL HIGH (ref 0.0–2.0)
Bicarbonate: 32.6 mmol/L — ABNORMAL HIGH (ref 20.0–28.0)
Drawn by: 23430
FIO2: 40
O2 Saturation: 89.3 %
Patient temperature: 36.7
pCO2 arterial: 72.8 mmHg (ref 32.0–48.0)
pH, Arterial: 7.322 — ABNORMAL LOW (ref 7.350–7.450)
pO2, Arterial: 59.4 mmHg — ABNORMAL LOW (ref 83.0–108.0)

## 2021-08-28 LAB — CBC WITH DIFFERENTIAL/PLATELET
Abs Immature Granulocytes: 0.11 10*3/uL — ABNORMAL HIGH (ref 0.00–0.07)
Basophils Absolute: 0.1 10*3/uL (ref 0.0–0.1)
Basophils Relative: 1 %
Eosinophils Absolute: 0.1 10*3/uL (ref 0.0–0.5)
Eosinophils Relative: 0 %
HCT: 39.5 % (ref 39.0–52.0)
Hemoglobin: 10.6 g/dL — ABNORMAL LOW (ref 13.0–17.0)
Immature Granulocytes: 1 %
Lymphocytes Relative: 5 %
Lymphs Abs: 0.7 10*3/uL (ref 0.7–4.0)
MCH: 23.4 pg — ABNORMAL LOW (ref 26.0–34.0)
MCHC: 26.8 g/dL — ABNORMAL LOW (ref 30.0–36.0)
MCV: 87.2 fL (ref 80.0–100.0)
Monocytes Absolute: 1.2 10*3/uL — ABNORMAL HIGH (ref 0.1–1.0)
Monocytes Relative: 9 %
Neutro Abs: 12.3 10*3/uL — ABNORMAL HIGH (ref 1.7–7.7)
Neutrophils Relative %: 84 %
Platelets: 252 10*3/uL (ref 150–400)
RBC: 4.53 MIL/uL (ref 4.22–5.81)
RDW: 17.7 % — ABNORMAL HIGH (ref 11.5–15.5)
WBC: 14.5 10*3/uL — ABNORMAL HIGH (ref 4.0–10.5)
nRBC: 0 % (ref 0.0–0.2)

## 2021-08-28 LAB — COMPREHENSIVE METABOLIC PANEL
ALT: 12 U/L (ref 0–44)
AST: 13 U/L — ABNORMAL LOW (ref 15–41)
Albumin: 3.6 g/dL (ref 3.5–5.0)
Alkaline Phosphatase: 76 U/L (ref 38–126)
Anion gap: 4 — ABNORMAL LOW (ref 5–15)
BUN: 17 mg/dL (ref 8–23)
CO2: 36 mmol/L — ABNORMAL HIGH (ref 22–32)
Calcium: 8.9 mg/dL (ref 8.9–10.3)
Chloride: 95 mmol/L — ABNORMAL LOW (ref 98–111)
Creatinine, Ser: 1.14 mg/dL (ref 0.61–1.24)
GFR, Estimated: >60 mL/min (ref >60)
Glucose, Bld: 142 mg/dL — ABNORMAL HIGH (ref 70–99)
Potassium: 4.9 mmol/L (ref 3.5–5.1)
Sodium: 135 mmol/L (ref 135–145)
Total Bilirubin: 0.6 mg/dL (ref 0.3–1.2)
Total Protein: 7.7 g/dL (ref 6.5–8.1)

## 2021-08-28 LAB — RESP PANEL BY RT-PCR (FLU A&B, COVID) ARPGX2
Influenza A by PCR: NEGATIVE
Influenza B by PCR: NEGATIVE
SARS Coronavirus 2 by RT PCR: NEGATIVE

## 2021-08-28 LAB — ECHOCARDIOGRAM COMPLETE
AR max vel: 2.89 cm2
AV Area VTI: 3.13 cm2
AV Area mean vel: 3.2 cm2
AV Mean grad: 5 mmHg
AV Peak grad: 9.1 mmHg
Ao pk vel: 1.51 m/s
Area-P 1/2: 3.54 cm2
Height: 72 in
MV VTI: 2.1 cm2
S' Lateral: 3.6 cm
Weight: 4560 oz

## 2021-08-28 LAB — URINALYSIS, ROUTINE W REFLEX MICROSCOPIC
Bilirubin Urine: NEGATIVE
Glucose, UA: NEGATIVE mg/dL
Ketones, ur: NEGATIVE mg/dL
Leukocytes,Ua: NEGATIVE
Nitrite: NEGATIVE
Protein, ur: NEGATIVE mg/dL
Specific Gravity, Urine: 1.01 (ref 1.005–1.030)
pH: 5.5 (ref 5.0–8.0)

## 2021-08-28 LAB — BLOOD GAS, VENOUS
Acid-Base Excess: 10.3 mmol/L — ABNORMAL HIGH (ref 0.0–2.0)
Bicarbonate: 31.4 mmol/L — ABNORMAL HIGH (ref 20.0–28.0)
Drawn by: 5678
FIO2: 100
O2 Saturation: 71.5 %
Patient temperature: 37.4
pCO2, Ven: 90.8 mmHg (ref 44.0–60.0)
pH, Ven: 7.243 — ABNORMAL LOW (ref 7.250–7.430)
pO2, Ven: 44.6 mmHg (ref 32.0–45.0)

## 2021-08-28 LAB — PROTIME-INR
INR: 1 (ref 0.8–1.2)
Prothrombin Time: 13.5 seconds (ref 11.4–15.2)

## 2021-08-28 LAB — URINALYSIS, MICROSCOPIC (REFLEX)
Bacteria, UA: NONE SEEN
RBC / HPF: >50 RBC/hpf (ref 0–5)
Squamous Epithelial / HPF: NONE SEEN (ref 0–5)
WBC, UA: NONE SEEN WBC/hpf (ref 0–5)

## 2021-08-28 LAB — CBG MONITORING, ED
Glucose-Capillary: 137 mg/dL — ABNORMAL HIGH (ref 70–99)
Glucose-Capillary: 222 mg/dL — ABNORMAL HIGH (ref 70–99)
Glucose-Capillary: 313 mg/dL — ABNORMAL HIGH (ref 70–99)

## 2021-08-28 LAB — HEMOGLOBIN A1C
Hgb A1c MFr Bld: 6.9 % — ABNORMAL HIGH (ref 4.8–5.6)
Mean Plasma Glucose: 151.33 mg/dL

## 2021-08-28 LAB — TROPONIN I (HIGH SENSITIVITY)
Troponin I (High Sensitivity): 23 ng/L — ABNORMAL HIGH (ref <18)
Troponin I (High Sensitivity): 18 ng/L — ABNORMAL HIGH (ref <18)

## 2021-08-28 LAB — LACTIC ACID, PLASMA: Lactic Acid, Venous: 0.8 mmol/L (ref 0.5–1.9)

## 2021-08-28 LAB — BRAIN NATRIURETIC PEPTIDE: B Natriuretic Peptide: 487 pg/mL — ABNORMAL HIGH (ref 0.0–100.0)

## 2021-08-28 LAB — APTT: aPTT: 29 seconds (ref 24–36)

## 2021-08-28 MED ORDER — MAGNESIUM SULFATE 2 GM/50ML IV SOLN
2.0000 g | Freq: Once | INTRAVENOUS | Status: AC
  Administered 2021-08-28: 2 g via INTRAVENOUS
  Filled 2021-08-28: qty 50

## 2021-08-28 MED ORDER — METOPROLOL SUCCINATE ER 50 MG PO TB24
50.0000 mg | ORAL_TABLET | Freq: Every day | ORAL | Status: DC
  Administered 2021-08-28 – 2021-08-31 (×4): 50 mg via ORAL
  Filled 2021-08-28 (×4): qty 1

## 2021-08-28 MED ORDER — METHYLPREDNISOLONE SODIUM SUCC 125 MG IJ SOLR
125.0000 mg | Freq: Once | INTRAMUSCULAR | Status: AC
  Administered 2021-08-28: 125 mg via INTRAVENOUS
  Filled 2021-08-28: qty 2

## 2021-08-28 MED ORDER — PANTOPRAZOLE SODIUM 40 MG PO TBEC
40.0000 mg | DELAYED_RELEASE_TABLET | Freq: Two times a day (BID) | ORAL | Status: DC
  Administered 2021-08-28 – 2021-08-31 (×7): 40 mg via ORAL
  Filled 2021-08-28 (×7): qty 1

## 2021-08-28 MED ORDER — IPRATROPIUM-ALBUTEROL 0.5-2.5 (3) MG/3ML IN SOLN
3.0000 mL | Freq: Once | RESPIRATORY_TRACT | Status: AC
  Administered 2021-08-28: 3 mL via RESPIRATORY_TRACT
  Filled 2021-08-28: qty 3

## 2021-08-28 MED ORDER — PRASUGREL HCL 10 MG PO TABS
10.0000 mg | ORAL_TABLET | Freq: Every day | ORAL | Status: DC
  Administered 2021-08-28 – 2021-08-31 (×4): 10 mg via ORAL
  Filled 2021-08-28 (×6): qty 1

## 2021-08-28 MED ORDER — PERFLUTREN LIPID MICROSPHERE
1.0000 mL | INTRAVENOUS | Status: AC | PRN
Start: 2021-08-28 — End: 2021-08-28
  Administered 2021-08-28: 4 mL via INTRAVENOUS
  Filled 2021-08-28: qty 10

## 2021-08-28 MED ORDER — ASPIRIN EC 81 MG PO TBEC
81.0000 mg | DELAYED_RELEASE_TABLET | Freq: Every day | ORAL | Status: DC
  Administered 2021-08-28 – 2021-08-31 (×4): 81 mg via ORAL
  Filled 2021-08-28 (×4): qty 1

## 2021-08-28 MED ORDER — TRAMADOL HCL 50 MG PO TABS: 50.0000 mg | ORAL_TABLET | Freq: Four times a day (QID) | ORAL | Status: DC | PRN

## 2021-08-28 MED ORDER — IPRATROPIUM-ALBUTEROL 0.5-2.5 (3) MG/3ML IN SOLN
3.0000 mL | Freq: Four times a day (QID) | RESPIRATORY_TRACT | Status: DC
  Administered 2021-08-28 – 2021-08-31 (×9): 3 mL via RESPIRATORY_TRACT
  Filled 2021-08-28 (×10): qty 3

## 2021-08-28 MED ORDER — ATORVASTATIN CALCIUM 40 MG PO TABS
40.0000 mg | ORAL_TABLET | Freq: Every day | ORAL | Status: DC
  Administered 2021-08-28 – 2021-08-30 (×3): 40 mg via ORAL
  Filled 2021-08-28 (×3): qty 1

## 2021-08-28 MED ORDER — SODIUM CHLORIDE 0.9% FLUSH
3.0000 mL | Freq: Two times a day (BID) | INTRAVENOUS | Status: DC
  Administered 2021-08-28 – 2021-08-31 (×6): 3 mL via INTRAVENOUS

## 2021-08-28 MED ORDER — SODIUM CHLORIDE 0.9% FLUSH: 3.0000 mL | INTRAVENOUS | Status: DC | PRN

## 2021-08-28 MED ORDER — GABAPENTIN 300 MG PO CAPS
300.0000 mg | ORAL_CAPSULE | Freq: Every day | ORAL | Status: DC
  Administered 2021-08-28 – 2021-08-30 (×3): 300 mg via ORAL
  Filled 2021-08-28 (×3): qty 1

## 2021-08-28 MED ORDER — INSULIN ASPART 100 UNIT/ML IJ SOLN
0.0000 [IU] | Freq: Every day | INTRAMUSCULAR | Status: DC
  Administered 2021-08-28: 4 [IU] via SUBCUTANEOUS
  Administered 2021-08-29 – 2021-08-30 (×2): 2 [IU] via SUBCUTANEOUS
  Filled 2021-08-28: qty 1

## 2021-08-28 MED ORDER — SPIRONOLACTONE 25 MG PO TABS
25.0000 mg | ORAL_TABLET | Freq: Every day | ORAL | Status: DC
  Administered 2021-08-28 – 2021-08-31 (×4): 25 mg via ORAL
  Filled 2021-08-28 (×4): qty 1

## 2021-08-28 MED ORDER — BUDESONIDE 0.5 MG/2ML IN SUSP
0.5000 mg | Freq: Two times a day (BID) | RESPIRATORY_TRACT | Status: DC
  Administered 2021-08-28 – 2021-08-31 (×6): 0.5 mg via RESPIRATORY_TRACT
  Filled 2021-08-28 (×6): qty 2

## 2021-08-28 MED ORDER — METHYLPREDNISOLONE SODIUM SUCC 125 MG IJ SOLR
60.0000 mg | Freq: Two times a day (BID) | INTRAMUSCULAR | Status: DC
  Administered 2021-08-28 – 2021-08-29 (×3): 60 mg via INTRAVENOUS
  Filled 2021-08-28 (×3): qty 2

## 2021-08-28 MED ORDER — FOLIC ACID 1 MG PO TABS
1.0000 mg | ORAL_TABLET | Freq: Every day | ORAL | Status: DC
  Administered 2021-08-28 – 2021-08-31 (×4): 1 mg via ORAL
  Filled 2021-08-28 (×4): qty 1

## 2021-08-28 MED ORDER — ENOXAPARIN SODIUM 60 MG/0.6ML IJ SOSY
60.0000 mg | PREFILLED_SYRINGE | Freq: Every day | INTRAMUSCULAR | Status: DC
  Administered 2021-08-28 – 2021-08-31 (×4): 60 mg via SUBCUTANEOUS
  Filled 2021-08-28 (×4): qty 0.6

## 2021-08-28 MED ORDER — NALOXONE HCL 2 MG/2ML IJ SOSY
2.0000 mg | PREFILLED_SYRINGE | Freq: Once | INTRAMUSCULAR | Status: AC
  Administered 2021-08-28: 2 mg via INTRAVENOUS

## 2021-08-28 MED ORDER — INSULIN ASPART 100 UNIT/ML IJ SOLN
0.0000 [IU] | Freq: Three times a day (TID) | INTRAMUSCULAR | Status: DC
Start: 2021-08-28 — End: 2021-08-31
  Administered 2021-08-28: 5 [IU] via SUBCUTANEOUS
  Administered 2021-08-29 – 2021-08-30 (×4): 8 [IU] via SUBCUTANEOUS
  Administered 2021-08-30 (×2): 11 [IU] via SUBCUTANEOUS
  Administered 2021-08-31: 09:00:00 5 [IU] via SUBCUTANEOUS
  Filled 2021-08-28: qty 1

## 2021-08-28 MED ORDER — SODIUM CHLORIDE 0.9 % IV SOLN: 250.0000 mL | INTRAVENOUS | Status: DC | PRN

## 2021-08-28 MED ORDER — ALBUTEROL SULFATE (2.5 MG/3ML) 0.083% IN NEBU
2.5000 mg | INHALATION_SOLUTION | Freq: Once | RESPIRATORY_TRACT | Status: AC
  Administered 2021-08-28: 2.5 mg via RESPIRATORY_TRACT
  Filled 2021-08-28: qty 3

## 2021-08-28 MED ORDER — SODIUM CHLORIDE 0.9 % IV SOLN
2.0000 g | INTRAVENOUS | Status: DC
  Administered 2021-08-28 – 2021-08-30 (×3): 2 g via INTRAVENOUS
  Filled 2021-08-28 (×4): qty 20

## 2021-08-28 MED ORDER — ONDANSETRON HCL 4 MG/2ML IJ SOLN: 4.0000 mg | Freq: Four times a day (QID) | INTRAMUSCULAR | Status: DC | PRN

## 2021-08-28 MED ORDER — POLYSACCHARIDE IRON COMPLEX 150 MG PO CAPS
150.0000 mg | ORAL_CAPSULE | Freq: Every day | ORAL | Status: DC
  Administered 2021-08-28 – 2021-08-31 (×4): 150 mg via ORAL
  Filled 2021-08-28 (×4): qty 1

## 2021-08-28 MED ORDER — FUROSEMIDE 10 MG/ML IJ SOLN
40.0000 mg | Freq: Once | INTRAMUSCULAR | Status: AC
  Administered 2021-08-28: 40 mg via INTRAVENOUS
  Filled 2021-08-28: qty 4

## 2021-08-28 MED ORDER — ACETAMINOPHEN 325 MG PO TABS: 650.0000 mg | ORAL_TABLET | ORAL | Status: DC | PRN

## 2021-08-28 MED ORDER — SODIUM CHLORIDE 0.9 % IV SOLN
500.0000 mg | INTRAVENOUS | Status: DC
  Administered 2021-08-28 – 2021-08-31 (×4): 500 mg via INTRAVENOUS
  Filled 2021-08-28 (×4): qty 5

## 2021-08-28 MED ORDER — FUROSEMIDE 10 MG/ML IJ SOLN
40.0000 mg | Freq: Two times a day (BID) | INTRAMUSCULAR | Status: AC
  Administered 2021-08-28 – 2021-08-30 (×5): 40 mg via INTRAVENOUS
  Filled 2021-08-28 (×5): qty 4

## 2021-08-28 NOTE — ED Notes (Signed)
Pt sitting on the side of stretcher at this time, no respiratory distress. Able to talk in complete sentences, a&o x4. VSS. No needs voiced at this time.

## 2021-08-28 NOTE — ED Triage Notes (Signed)
Pt brought in by [facility] with c/o difficulty breathing. Pt's O2 sat initially 77% on RA and responsive to verbal stimuli, but was confused. Pt placed on NRB by EMS. EMS later placed pt on CPAP prior to arrival. Upon arrival to ED, pt not responsive, even to painful stimuli. Dr. Roderic Palau called to bedside.

## 2021-08-28 NOTE — ED Notes (Signed)
Patient sitting up in bed drinking water at this time. Tolerating N/C well. Patient alert and oriented laughing/speaking with staff. Much improvement noted from previous LOC.

## 2021-08-28 NOTE — Progress Notes (Signed)
Pt is a hard stick, Antibiotics given before blood cultures drawn

## 2021-08-28 NOTE — ED Notes (Signed)
Fixed pt leads again

## 2021-08-28 NOTE — ED Notes (Signed)
Per Dr. Carles Collet, remove Bi-pap and place patient on 5L/[place] at this time due to patient being awake and wanting mask off face.

## 2021-08-28 NOTE — ED Notes (Signed)
Changed urine canister 

## 2021-08-28 NOTE — Progress Notes (Signed)
Notified bedside nurse of need to draw blood cultures.  

## 2021-08-28 NOTE — Progress Notes (Signed)
*  PRELIMINARY RESULTS* Echocardiogram 2D Echocardiogram has been performed.  Gregory Alvarado 08/28/2021, 4:10 PM

## 2021-08-28 NOTE — ED Notes (Signed)
Unable to insert foley cath. Dr. Roderic Palau aware and states okay to place condom cath or purwick.

## 2021-08-28 NOTE — ED Notes (Signed)
Patient on call light twice stating that he had BM, checked and none noted.

## 2021-08-28 NOTE — ED Notes (Addendum)
Melanie Cugino and Alecia Lemming attempted foley cath twice.  Unable to get foley in.  Dr. Roderic Palau stated foley could be discontinued.   Pur-wick would be acceptable.

## 2021-08-28 NOTE — Progress Notes (Signed)
PHARMACIST - PHYSICIAN COMMUNICATION  CONCERNING:  Enoxaparin (Lovenox) for DVT Prophylaxis    RECOMMENDATION: Patient was prescribed enoxaprin 40mg  q24 hours for VTE prophylaxis.   Filed Weights   08/28/21 0923  Weight: 129.3 kg (285 lb)    Body mass index is 38.65 kg/m.  Estimated Creatinine Clearance: 86.2 mL/min (by C-G formula based on SCr of 1.14 mg/dL).   Based on [place] patient is candidate for enoxaparin 0.5mg /kg TBW SQ every 24 hours based on BMI being >30.  DESCRIPTION: Pharmacy has adjusted enoxaparin dose per [facility] policy.  Patient is now receiving enoxaparin 60 mg every 24 hours    Ena Dawley, PharmD Clinical Pharmacist  08/28/2021 12:46 PM

## 2021-08-28 NOTE — H&P (Signed)
History and Physical  Gregory Alvarado GDJ:[id number] DOB: Feb 01, 1951 DOA: 08/28/2021   PCP: Celene Squibb, MD   Patient coming from: Home  Chief Complaint: sob  HPI:  Gregory Alvarado is a 70 y.o. male with medical history of COPD, tobacco abuse, dCHF, GIB (gastric ulcers on EGD 10/2019) , HTN, HLD, DM2, poor compliance presenting with shortness of breath that started on 08/27/2021.  The patient has noted increasing lower extremity edema.  He denies worsening orthopnea or increasing abdominal girth.  He continues to smoke 1 pack/day.  He has a nearly 100-pack-year history of tobacco.  He denies any fevers, chills, headache, neck pain, chest pain, nausea, vomiting, diarrhea, abdominal pain, dysuria, hematuria.  There is not been any hematochezia or melena. He states that he has not been taking his Lasix every day.,  But states that he has been compliant with his other medications. Because of worsening shortness of breath, he activated EMS.  He was noted to have oxygen saturation of 77% on room air.  He was placed on CPAP, but was very somnolent at the time of arrival in the emergency department.  He was placed on BiPAP and started on IV Lasix and Solu-Medrol.  His mentation improved. ED The patient had low-grade temperature of 99.3 F but was hemodynamically stable.  Oxygen saturation was 96% on BiPAP FiO2 40.  BMP showed sodium 135, potassium 4.9, bicarb 36, serum creatinine 1.4.  LFTs were unremarkable.  WBC 14.5, hemoglobin 10.6, platelets 252,000.  BNP was 487.  Chest x-ray showed increased interstitial markings.  ABG showed 7.3 2/72/59 on 40%.  Assessment/Plan: Acute on chronic respiratory failure with hypoxia and hypercarbia -secondary to COPD exacerbation and CHF -Initially placed on BiPAP -Patient is chronically on 2 L at home -Wean oxygen back to 2 L as tolerated for saturation greater 90%  COPD exacerbation -start pulmicort -start duonebs -continue IV solumedrol  Acute on chronic  diastolic CHF -Continue IV furosemide -Daily weights -Accurate I's and O's -12/19/2020 echo EF 60-65%, no WMA, mild MR  Coronary artery disease -Chest pain presently -EKG without concerning ischemic changes 09/12/20 cath: LM LIs, LAD prox 70% and 90% mid, LCX 80% prox with thrombus, OM2 85%, RCA small non dom 60%. LVEDP elevated but number not given,  - received DES to prox/mid LAD, DES to prox LCX, DES to OM2.  08/2020 echo: LVEF 35-40% grade II DDx, mild MR -continue ASA and Effient  Obesity class II -BMI 38.6 -Lifestyle modification  DM2 -07/14/2021 A1c 6.4 -NovoLog sliding scale  Essential hypertension -Continue metoprolol succinate  Hyperlipidemia -continue statin  Tobacco abuse -cessation discussed     Past Medical History:  Diagnosis Date   Anemia    Arthritis    left shoulder   COPD (chronic obstructive pulmonary disease) (HCC)    Coronary artery disease 09/11/2020   NSTEMI and 3 stents in [facility]   Diabetes mellitus without complication ([place])    Dyslipidemia    Hypertension    Insomnia    Insomnia    Morbid obesity ([place])    Tobacco use    Past Surgical History:  Procedure Laterality Date   BIOPSY  10/28/2019   Procedure: BIOPSY;  Surgeon: Danie Binder, MD;  [facility];  Service: Endoscopy;;  gastric   BIOPSY  04/15/2020   Procedure: BIOPSY;  Surgeon: Daneil Dolin, MD;  [facility];  Service: Endoscopy;;  gastric   CARDIAC CATHETERIZATION  08/2020  3 stents placed   COLON RESECTION  1990s?   DIVERTICULITIS   COLONOSCOPY WITH PROPOFOL N/A 04/15/2020   Rourk: Poor colon prep. two 6 to 8 mm polyps removed from the hepatic flexure, tubular adenomas.  Repeat colonoscopy later this year.   ESOPHAGOGASTRODUODENOSCOPY (EGD) WITH PROPOFOL N/A 10/28/2019   Dr. Oneida Alar: Multiple cratered gastric ulcers, gastritis/duodenitis.  Path showed H. pylori.  Patient treated with amoxicillin/Biaxin/PPI twice daily.    ESOPHAGOGASTRODUODENOSCOPY (EGD) WITH PROPOFOL N/A 04/15/2020   Rourk: Patchy gastric erythema status post biopsy to document eradication of H. pylori.  Biopsy showed persistent H. pylori.  Previously noted gastric ulcer is completely healed.   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   HERNIA REPAIR     UHR   MALONEY DILATION N/A 04/15/2020   Procedure: Venia Minks DILATION;  Surgeon: Daneil Dolin, MD;  [facility];  Service: Endoscopy;  Laterality: N/A;   POLYPECTOMY  04/15/2020   Procedure: POLYPECTOMY;  Surgeon: Daneil Dolin, MD;  [facility];  Service: Endoscopy;;  colon   ROTATOR CUFF REPAIR Bilateral    TYMPANOPLASTY     Social History:  reports that he has been smoking cigarettes. He has been smoking an average of 2 packs per day. He has never used smokeless tobacco. He reports current alcohol use. He reports that he does not use drugs.   Family History  Problem Relation Age of Onset   Diabetes Mother    Heart attack Mother    Heart attack Father    Heart attack Brother    Colon cancer Neg Hx    Stomach cancer Neg Hx      Allergies  Allergen Reactions   Caduet [Amlodipine-Atorvastatin] Swelling     Prior to Admission medications   Medication Sig Start Date End Date Taking? Authorizing Provider  acetaminophen (TYLENOL) 325 MG tablet Take 2 tablets (650 mg total) by mouth every 6 (six) hours as needed for mild pain (or Fever >/= 101). 10/29/19   Roxan Hockey, MD  aspirin EC 81 MG tablet Take 81 mg by mouth daily. Swallow whole.    [provider]  atorvastatin (LIPITOR) 40 MG tablet Take 40 mg by mouth at bedtime. 08/30/19   [provider]  cyanocobalamin (,VITAMIN B-12,) 1000 MCG/ML injection Inject 1,000 mcg into the muscle every 30 (thirty) days.     [provider]  FERREX 150 150 MG capsule Take 150 mg by  mouth daily. 12/04/20   [provider]  folic acid (FOLVITE) 1 MG tablet Take 1 tablet (1 mg total) by mouth daily. Patient taking differently: Take 1 mg by mouth at bedtime. 10/30/19   Roxan Hockey, MD  furosemide (LASIX) 40 MG tablet TAKE 1 TABLET BY MOUTH IF NEEDED. 05/23/21   Arnoldo Lenis, MD  gabapentin (NEURONTIN) 300 MG capsule Take 1 capsule by mouth at bedtime. 12/30/20   [provider]  hydrochlorothiazide (HYDRODIURIL) 12.5 MG tablet Take 12.5 mg by mouth at bedtime.  01/23/20   [provider]  insulin NPH-regular Human (NOVOLIN 70/30) (70-30) 100 UNIT/ML injection Inject 40 Units into the skin in the morning and at bedtime. Inject 40 units in morning and 40 units every evening    [provider]  isosorbide mononitrate (IMDUR) 30 MG 24 hr tablet Take 1 tablet (30 mg total) by mouth daily. May take 2 if needed 07/09/20 10/07/20  Kilroy, Luke K, PA-C  lisinopril (PRINIVIL,ZESTRIL) 20 MG tablet Take 1 tablet (20 mg total) by mouth daily. 06/09/17   Cassandria Anger, MD  metoprolol succinate (TOPROL XL) 100 MG 24 hr tablet Take 1.5 tablets (150 mg total) by mouth daily. Take with or immediately following a meal. 12/03/20   Branch, Alphonse Guild, MD  nitroGLYCERIN (NITROSTAT) 0.4 MG SL tablet DISSOLVE 1 TABLET SUBLINGUALLY AS NEEDED FOR CHEST PAIN, MAY REPEAT EVERY 5 MINUTES. AFTER 3 CALL 911. 06/26/21   Arnoldo Lenis, MD  pantoprazole (PROTONIX) 40 MG tablet TAKE ONE TABLET BY MOUTH 2 TIMES A DAY BEFORE A MEAL 07/22/21   Annitta Needs, NP  prasugrel (EFFIENT) 10 MG TABS tablet Take 10 mg by mouth daily. 09/24/20   [provider]  senna (SENOKOT) 8.6 MG TABS tablet Take 1 tablet (8.6 mg total) by mouth daily. 01/07/21   Manuella Ghazi, Pratik D, DO  SPIRIVA HANDIHALER 18 MCG inhalation capsule Place 1 capsule into inhaler and inhale daily. 09/24/20   [provider]  spironolactone (ALDACTONE) 25 MG tablet TAKE 1 TABLET BY MOUTH ONCE A DAY.  10/28/20   Arnoldo Lenis, MD  SUMAtriptan (IMITREX) 25 MG tablet Take 25 mg by mouth every 2 (two) hours as needed. 10/10/20   [provider]  SYMBICORT 160-4.5 MCG/ACT inhaler Inhale 2 puffs into the lungs 2 (two) times daily. 09/24/20   [provider]  traMADol (ULTRAM) 50 MG tablet Take 50-100 mg by mouth in the morning and at bedtime. 09/20/19   [provider]  zolpidem (AMBIEN) 5 MG tablet Take 5 mg by mouth at bedtime as needed. 09/26/20   [provider]    Review of Systems:  Constitutional:  No weight loss, night sweats, Fevers, chills, fatigue.  Head&Eyes: No headache.  No vision loss.  No eye pain or scotoma ENT:  No Difficulty swallowing,Tooth/dental problems,Sore throat,  No ear ache, post nasal drip,  Cardio-vascular:  No chest pain, Orthopnea, PND, swelling in lower extremities,  dizziness, palpitations  GI:  No  abdominal pain, nausea, vomiting, diarrhea, loss of appetite, hematochezia, melena, heartburn, indigestion, Resp:  No  No coughing up of blood .No wheezing.No chest wall deformity  Skin:  no rash or lesions.  GU:  no dysuria, change in color of urine, no urgency or frequency. No flank pain.  Musculoskeletal:  No joint pain or swelling. No decreased range of motion. No back pain.  Psych:  No change in mood or affect. No depression or anxiety. Neurologic: No headache, no dysesthesia, no focal weakness, no vision loss. No syncope  Physical Exam: Vitals:   08/28/21 1115 08/28/21 1130 08/28/21 1145 08/28/21 1154  BP: (!) 145/68 (!) 143/66 128/72   Pulse: 81 80 81   Resp: 17 16 15    Temp:      TempSrc:      SpO2: 95% 96% 96% (!) 5%  Weight:      Height:       General:  A&O x 3, NAD, nontoxic, pleasant/cooperative Head/Eye: No conjunctival hemorrhage, no icterus, [place]/AT, No nystagmus ENT:  No icterus,  No thrush, good dentition, no pharyngeal exudate Neck:  No masses, no lymphadenpathy, no bruits CV:  RRR, no rub,  no gallop, no S3 Lung:  bilateral exp wheeze; bilateral rales Abdomen: soft/NT, +BS, nondistended, no peritoneal signs Ext: No cyanosis, No rashes, No petechiae, No lymphangitis, 2 + LE edema Neuro: CNII-XII intact, strength 4/5 in bilateral upper and lower extremities, no dysmetria  Labs on Admission:  Basic Metabolic Panel: Recent Labs  Lab 08/28/21 0859  NA 135  K 4.9  CL 95*  CO2 36*  GLUCOSE 142*  BUN 17  CREATININE 1.14  CALCIUM 8.9   Liver Function Tests: Recent Labs  Lab 08/28/21 0859  AST 13*  ALT 12  ALKPHOS 76  BILITOT 0.6  PROT 7.7  ALBUMIN 3.6   No results for input(s): LIPASE, AMYLASE in the last 168 hours. No results for input(s): AMMONIA in the last 168 hours. CBC: Recent Labs  Lab 08/28/21 0859  WBC 14.5*  NEUTROABS 12.3*  HGB 10.6*  HCT 39.5  MCV 87.2  PLT 252   Coagulation Profile: Recent Labs  Lab 08/28/21 0900  INR 1.0   Cardiac Enzymes: No results for input(s): CKTOTAL, CKMB, CKMBINDEX, TROPONINI in the last 168 hours. BNP: Invalid input(s): POCBNP CBG: Recent Labs  Lab 08/28/21 0847  GLUCAP 137*   Urine analysis:    Component Value Date/Time   COLORURINE YELLOW 08/28/2021 1025   APPEARANCEUR CLEAR 08/28/2021 1025   LABSPEC 1.010 08/28/2021 1025   PHURINE 5.5 08/28/2021 1025   GLUCOSEU NEGATIVE 08/28/2021 1025   HGBUR LARGE (A) 08/28/2021 1025   BILIRUBINUR NEGATIVE 08/28/2021 1025   [place] 08/28/2021 1025   PROTEINUR NEGATIVE 08/28/2021 1025   NITRITE NEGATIVE 08/28/2021 1025   LEUKOCYTESUR NEGATIVE 08/28/2021 1025   Sepsis Labs: @LABRCNTIP (procalcitonin:4,lacticidven:4) ) Recent Results (from the past 240 hour(s))  Resp Panel by RT-PCR (Flu A&B, Covid) Nasopharyngeal Swab     Status: None   Collection Time: 08/28/21  9:30 AM   Specimen: Nasopharyngeal Swab; Nasopharyngeal(NP) swabs in vial transport medium  Result Value Ref Range Status   SARS Coronavirus 2 by RT PCR NEGATIVE NEGATIVE Final     Comment: (NOTE) SARS-CoV-2 target nucleic acids are NOT DETECTED.  The SARS-CoV-2 RNA is generally detectable in upper respiratory specimens during the acute phase of infection. The lowest concentration of SARS-CoV-2 viral copies this assay can detect is 138 copies/mL. A negative result does not preclude SARS-Cov-2 infection and should not be used as the sole basis for treatment or other patient management decisions. A negative result may occur with  improper specimen collection/handling, submission of specimen other than nasopharyngeal swab, presence of viral mutation(s) within the areas targeted by this assay, and inadequate number of viral copies(<138 copies/mL). A negative result must be combined with clinical observations, patient history, and epidemiological information. The expected result is Negative.  Fact Sheet for Patients:  EntrepreneurPulse.com.au  Fact Sheet for Healthcare Providers:  IncredibleEmployment.be  This test is no t yet approved or cleared by the [place] FDA and  has been authorized for detection and/or diagnosis of SARS-CoV-2 by FDA under an Emergency Use Authorization (EUA). This EUA will remain  in effect (meaning this test can be used) for the duration of the COVID-19 declaration under Section 564(b)(1) of the Act, 21 U.S.C.section 360bbb-3(b)(1), unless the authorization is terminated  or revoked sooner.       Influenza A by PCR NEGATIVE NEGATIVE Final   Influenza B by PCR NEGATIVE NEGATIVE Final    Comment: (NOTE) The Xpert Xpress SARS-CoV-2/FLU/RSV plus assay is intended as an aid in the diagnosis of influenza from Nasopharyngeal swab specimens and should not be used as a sole basis for treatment. Nasal washings and aspirates are unacceptable for Xpert Xpress SARS-CoV-2/FLU/RSV testing.  Fact Sheet for Patients: EntrepreneurPulse.com.au  Fact Sheet for Healthcare  Providers: IncredibleEmployment.be  This test is not yet approved or  cleared by the [place] and has been authorized for detection and/or diagnosis of SARS-CoV-2 by FDA under an Emergency Use Authorization (EUA). This EUA will remain in effect (meaning this test can be used) for the duration of the COVID-19 declaration under Section 564(b)(1) of the Act, 21 U.S.C. section 360bbb-3(b)(1), unless the authorization is terminated or revoked.  Performed at [facility], [address] [place], [place] [address]   Blood Culture (routine x 2)     Status: None (Preliminary result)   Collection Time: 08/28/21 10:52 AM   Specimen: BLOOD  Result Value Ref Range Status   Specimen Description BLOOD BLOOD LEFT HAND  Final   Special Requests   Final    BOTTLES DRAWN AEROBIC AND ANAEROBIC Blood Culture adequate volume Performed at [facility], [address], [place], [place] [address]    Culture PENDING  Incomplete   Report Status PENDING  Incomplete  Blood Culture (routine x 2)     Status: None (Preliminary result)   Collection Time: 08/28/21 10:52 AM   Specimen: BLOOD  Result Value Ref Range Status   Specimen Description BLOOD BLOOD LEFT FOREARM  Final   Special Requests   Final    BOTTLES DRAWN AEROBIC AND ANAEROBIC Blood Culture adequate volume Performed at [facility], [address], [place],  [address]    Culture PENDING  Incomplete   Report Status PENDING  Incomplete     Radiological Exams on Admission: DG Chest Port 1 View  Result Date: 08/28/2021 CLINICAL DATA:  Shortness of breath. EXAM: PORTABLE CHEST 1 VIEW COMPARISON:  01/06/2021 FINDINGS: 0908 hours. The lungs are clear without focal pneumonia, edema, pneumothorax or pleural effusion. Interstitial markings are diffusely coarsened with chronic features. The cardio pericardial silhouette is enlarged. The visualized bony structures of the thorax show no acute abnormality. Telemetry leads overlie  the chest. IMPRESSION: No acute cardiopulmonary findings. Chronic interstitial coarsening. Electronically Signed   By: Misty Stanley M.D.   On: 08/28/2021 09:31    EKG: Independently reviewed. Sinus, nonspecific TWI    Time spent:70 minutes Code Status:   FULL Family Communication:  No Family at bedside Disposition Plan: expect 2 day hospitalization Consults called: none DVT Prophylaxis: [place] Lovenox  Orson Eva, DO  [facility] Pager [phone]  If 7PM-7AM, please contact night-coverage [email or web address] Password [email or web address] 08/28/2021, 11:59 AM

## 2021-08-28 NOTE — ED Provider Notes (Signed)
[facility] EMERGENCY DEPARTMENT Provider Note   CSN: [id number] Arrival date & time: 08/28/21  7342     History Chief Complaint  Patient presents with   Respiratory Distress    Gregory Alvarado is a 70 y.o. male.  Patient has a history of COPD with intubation and congestive heart failure and coronary artery disease.  He became short of breath this morning and when paramedics arrived his O2 sats was in the 14s.  Patient is not on oxygen at home.  Patient was put on BiPAP by the paramedics  The history is provided by medical records, a relative and the EMS personnel.  Shortness of Breath Severity:  Severe Onset quality:  Sudden Timing:  Constant Progression:  Worsening Chronicity:  Recurrent Context: activity   Relieved by:  Nothing Worsened by:  Nothing Ineffective treatments:  None tried Associated symptoms: no abdominal pain       Past Medical History:  Diagnosis Date   Anemia    Arthritis    left shoulder   COPD (chronic obstructive pulmonary disease) (HCC)    Coronary artery disease 09/11/2020   NSTEMI and 3 stents in [facility]   Diabetes mellitus without complication ([place])    Dyslipidemia    Hypertension    Insomnia    Insomnia    Morbid obesity ([place])    Tobacco use     Patient Active Problem List   Diagnosis Date Noted   Acute anemia 01/06/2021   SBO (small bowel obstruction) (HCC)    Guaiac positive stools 07/22/2020   Folate deficiency 07/22/2020   History of colonic polyps 07/16/2020   Chronic diastolic CHF (congestive heart failure) ([place]) 07/09/2020   Exertional angina ([place]) 07/09/2020   CRI (chronic renal insufficiency), stage 2 (mild) 07/09/2020   Colon cancer screening 01/23/2020   PUD (peptic ulcer disease) 01/23/2020   Esophageal dysphagia 01/23/2020   H. pylori infection 01/23/2020   GI bleed 10/27/2019   Rectal bleeding    Symptomatic anemia 10/26/2019   Obesity, Class III, BMI 40-49.9 (morbid obesity) ([place]) 10/26/2019   Iron  deficiency anemia    Melena    Constipation    Personal history of noncompliance with medical treatment, presenting hazards to health 02/24/2017   Class 2 obesity due to excess calories with body mass index (BMI) of 39.0 to 39.9 in adult 01/05/2017   Current smoker 01/05/2017   Essential hypertension    Insulin dependent type 2 diabetes mellitus (HCC)    COPD (chronic obstructive pulmonary disease) ([place])    Arthritis    Hyperlipidemia    Insomnia     Past Surgical History:  Procedure Laterality Date   BIOPSY  10/28/2019   Procedure: BIOPSY;  Surgeon: Danie Binder, MD;  Location: [facility] END[facility];  Service: Endoscopy;;  gastric   BIOPSY  04/15/2020   Procedure: BIOPSY;  Surgeon: Daneil Dolin, MD;  Location: [facility];  Service: Endoscopy;;  gastric   CARDIAC CATHETERIZATION  08/2020   3 stents placed   COLON RESECTION  1990s?   DIVERTICULITIS   COLONOSCOPY WITH PROPOFOL N/A 04/15/2020   Rourk: Poor colon prep. two 6 to 8 mm polyps removed from the hepatic flexure, tubular adenomas.  Repeat colonoscopy later this year.   ESOPHAGOGASTRODUODENOSCOPY (EGD) WITH PROPOFOL N/A 10/28/2019   Dr. Oneida Alar: Multiple cratered gastric ulcers, gastritis/duodenitis.  Path showed H. pylori.  Patient treated with amoxicillin/Biaxin/PPI twice daily.   ESOPHAGOGASTRODUODENOSCOPY (EGD) WITH PROPOFOL N/A 04/15/2020   Rourk: Patchy gastric erythema  status post biopsy to document eradication of H. pylori.  Biopsy showed persistent H. pylori.  Previously noted gastric ulcer is completely healed.   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   HERNIA REPAIR     UHR   MALONEY DILATION N/A 04/15/2020   Procedure: Venia Minks DILATION;  Surgeon: Daneil Dolin, MD;  Location: [facility];  Service: Endoscopy;  Laterality: N/A;   POLYPECTOMY  04/15/2020   Procedure: POLYPECTOMY;  Surgeon: Daneil Dolin, MD;   Location: [facility] END[facility];  Service: Endoscopy;;  colon   ROTATOR CUFF REPAIR Bilateral    TYMPANOPLASTY         Family History  Problem Relation Age of Onset   Diabetes Mother    Heart attack Mother    Heart attack Father    Heart attack Brother    Colon cancer Neg Hx    Stomach cancer Neg Hx     Social History   Tobacco Use   Smoking status: Every Day    Packs/day: 2.00    Types: Cigarettes   Smokeless tobacco: Never  Vaping Use   Vaping Use: Never used  Substance Use Topics   Alcohol use: Yes    Comment: very rare   Drug use: No    Home Medications Prior to Admission medications   Medication Sig Start Date End Date Taking? Authorizing Provider  acetaminophen (TYLENOL) 325 MG tablet Take 2 tablets (650 mg total) by mouth every 6 (six) hours as needed for mild pain (or Fever >/= 101). 10/29/19   Roxan Hockey, MD  aspirin EC 81 MG tablet Take 81 mg by mouth daily. Swallow whole.    [provider]  atorvastatin (LIPITOR) 40 MG tablet Take 40 mg by mouth at bedtime. 08/30/19   [provider]  cyanocobalamin (,VITAMIN B-12,) 1000 MCG/ML injection Inject 1,000 mcg into the muscle every 30 (thirty) days.     [provider]  FERREX 150 150 MG capsule Take 150 mg by mouth daily. 12/04/20   [provider]  folic acid (FOLVITE) 1 MG tablet Take 1 tablet (1 mg total) by mouth daily. Patient taking differently: Take 1 mg by mouth at bedtime. 10/30/19   Roxan Hockey, MD  furosemide (LASIX) 40 MG tablet TAKE 1 TABLET BY MOUTH IF NEEDED. 05/23/21   Arnoldo Lenis, MD  gabapentin (NEURONTIN) 300 MG capsule Take 1 capsule by mouth at bedtime. 12/30/20   [provider]  hydrochlorothiazide (HYDRODIURIL) 12.5 MG tablet Take 12.5 mg by mouth at bedtime.  01/23/20   [provider]  insulin NPH-regular Human (NOVOLIN 70/30) (70-30) 100 UNIT/ML injection Inject 40 Units into the skin in the morning and at bedtime. Inject 40 units  in morning and 40 units every evening    [provider]  isosorbide mononitrate (IMDUR) 30 MG 24 hr tablet Take 1 tablet (30 mg total) by mouth daily. May take 2 if needed 07/09/20 10/07/20  Erlene Quan, PA-C  lisinopril (PRINIVIL,ZESTRIL) 20 MG tablet Take 1 tablet (20 mg total) by mouth daily. 06/09/17   Cassandria Anger, MD  metoprolol succinate (TOPROL XL) 100 MG 24 hr tablet Take 1.5 tablets (150 mg total) by mouth daily. Take with or immediately following a meal. 12/03/20   Branch, Alphonse Guild, MD  nitroGLYCERIN (NITROSTAT) 0.4 MG SL tablet DISSOLVE 1 TABLET SUBLINGUALLY AS NEEDED FOR CHEST PAIN, MAY REPEAT EVERY  5 MINUTES. AFTER 3 CALL 911. 06/26/21   Arnoldo Lenis, MD  pantoprazole (PROTONIX) 40 MG tablet TAKE ONE TABLET BY MOUTH 2 TIMES A DAY BEFORE A MEAL 07/22/21   Annitta Needs, NP  prasugrel (EFFIENT) 10 MG TABS tablet Take 10 mg by mouth daily. 09/24/20   [provider]  senna (SENOKOT) 8.6 MG TABS tablet Take 1 tablet (8.6 mg total) by mouth daily. 01/07/21   Manuella Ghazi, Pratik D, DO  SPIRIVA HANDIHALER 18 MCG inhalation capsule Place 1 capsule into inhaler and inhale daily. 09/24/20   [provider]  spironolactone (ALDACTONE) 25 MG tablet TAKE 1 TABLET BY MOUTH ONCE A DAY. 10/28/20   Arnoldo Lenis, MD  SUMAtriptan (IMITREX) 25 MG tablet Take 25 mg by mouth every 2 (two) hours as needed. 10/10/20   [provider]  SYMBICORT 160-4.5 MCG/ACT inhaler Inhale 2 puffs into the lungs 2 (two) times daily. 09/24/20   [provider]  traMADol (ULTRAM) 50 MG tablet Take 50-100 mg by mouth in the morning and at bedtime. 09/20/19   [provider]  zolpidem (AMBIEN) 5 MG tablet Take 5 mg by mouth at bedtime as needed. 09/26/20   [provider]    Allergies    Caduet [amlodipine-atorvastatin]  Review of Systems   Review of Systems  Unable to perform ROS: Mental status change  Respiratory:  Positive for shortness of breath.    Gastrointestinal:  Negative for abdominal pain.   Physical Exam Updated Vital Signs BP (!) 143/66    Pulse 80    Temp 98.1 F (36.7 C) (Bladder)    Resp 16    Ht 6' (1.829 m)    Wt 129.3 kg    SpO2 96%    BMI 38.65 kg/m   Physical Exam Vitals and nursing note reviewed.  Constitutional:      Appearance: He is well-developed.     Comments: Patient only responding to painful stimuli  HENT:     Head: Normocephalic.     Nose: Nose normal.  Eyes:     General: No scleral icterus.    Conjunctiva/sclera: Conjunctivae normal.  Neck:     Thyroid: No thyromegaly.  Cardiovascular:     Rate and Rhythm: Regular rhythm. Tachycardia present.     Heart sounds: No murmur heard.   No friction rub. No gallop.  Pulmonary:     Breath sounds: No stridor. No wheezing or rales.  Chest:     Chest wall: No tenderness.  Abdominal:     General: There is no distension.     Tenderness: There is no abdominal tenderness. There is no rebound.  Musculoskeletal:        General: Normal range of motion.     Cervical back: Neck supple.  Lymphadenopathy:     Cervical: No cervical adenopathy.  Skin:    Findings: No erythema or rash.  Neurological:     Motor: No abnormal muscle tone.     Coordination: Coordination normal.     Comments: Patient only responding to painful stimuli cannot answer any questions appropriately    ED Results / Procedures / Treatments   Labs (all labs ordered are listed, but only abnormal results are displayed) Labs Reviewed  CBC WITH DIFFERENTIAL/PLATELET - Abnormal; Notable for the following components:      Result Value   WBC 14.5 (*)    Hemoglobin 10.6 (*)    MCH 23.4 (*)    MCHC 26.8 (*)  RDW 17.7 (*)    Neutro Abs 12.3 (*)    Monocytes Absolute 1.2 (*)    Abs Immature Granulocytes 0.11 (*)    All other components within normal limits  COMPREHENSIVE METABOLIC PANEL - Abnormal; Notable for the following components:   Chloride 95 (*)    CO2 36 (*)    Glucose, Bld  142 (*)    AST 13 (*)    Anion gap 4 (*)    All other components within normal limits  BRAIN NATRIURETIC PEPTIDE - Abnormal; Notable for the following components:   B Natriuretic Peptide 487.0 (*)    All other components within normal limits  URINALYSIS, ROUTINE W REFLEX MICROSCOPIC - Abnormal; Notable for the following components:   Hgb urine dipstick LARGE (*)    All other components within normal limits  BLOOD GAS, ARTERIAL - Abnormal; Notable for the following components:   pH, Arterial 7.322 (*)    pCO2 arterial 72.8 (*)    pO2, Arterial 59.4 (*)    Bicarbonate 32.6 (*)    Acid-Base Excess 10.5 (*)    All other components within normal limits  BLOOD GAS, VENOUS - Abnormal; Notable for the following components:   pH, Ven 7.243 (*)    pCO2, Ven 90.8 (*)    Bicarbonate 31.4 (*)    Acid-Base Excess 10.3 (*)    All other components within normal limits  CBG MONITORING, ED - Abnormal; Notable for the following components:   Glucose-Capillary 137 (*)    All other components within normal limits  TROPONIN I (HIGH SENSITIVITY) - Abnormal; Notable for the following components:   Troponin I (High Sensitivity) 23 (*)    All other components within normal limits  TROPONIN I (HIGH SENSITIVITY) - Abnormal; Notable for the following components:   Troponin I (High Sensitivity) 18 (*)    All other components within normal limits  RESP PANEL BY RT-PCR (FLU A&B, COVID) ARPGX2  CULTURE, BLOOD (ROUTINE X 2)  CULTURE, BLOOD (ROUTINE X 2)  URINE CULTURE  LACTIC ACID, PLASMA  PROTIME-INR  APTT  URINALYSIS, MICROSCOPIC (REFLEX)    EKG None  Radiology DG Chest Port 1 View  Result Date: 08/28/2021 CLINICAL DATA:  Shortness of breath. EXAM: PORTABLE CHEST 1 VIEW COMPARISON:  01/06/2021 FINDINGS: 0908 hours. The lungs are clear without focal pneumonia, edema, pneumothorax or pleural effusion. Interstitial markings are diffusely coarsened with chronic features. The cardio pericardial  silhouette is enlarged. The visualized bony structures of the thorax show no acute abnormality. Telemetry leads overlie the chest. IMPRESSION: No acute cardiopulmonary findings. Chronic interstitial coarsening. Electronically Signed   By: Misty Stanley M.D.   On: 08/28/2021 09:31    Procedures Procedures   Medications Ordered in ED Medications  cefTRIAXone (ROCEPHIN) 2 g in sodium chloride 0.9 % 100 mL IVPB (0 g Intravenous Stopped 08/28/21 0948)  azithromycin (ZITHROMAX) 500 mg in sodium chloride 0.9 % 250 mL IVPB (0 mg Intravenous Stopped 08/28/21 1054)  ipratropium-albuterol (DUONEB) 0.5-2.5 (3) MG/3ML nebulizer solution 3 mL (has no administration in time range)  albuterol (PROVENTIL) (2.5 MG/3ML) 0.083% nebulizer solution 2.5 mg (has no administration in time range)  naloxone [facility]) injection 2 mg (2 mg Intravenous Given by Other 08/28/21 0844)  methylPREDNISolone sodium succinate (SOLU-MEDROL) 125 mg/2 mL injection 125 mg (125 mg Intravenous Given 08/28/21 0922)  ipratropium-albuterol (DUONEB) 0.5-2.5 (3) MG/3ML nebulizer solution 3 mL (3 mLs Nebulization Given 08/28/21 0918)  albuterol (PROVENTIL) (2.5 MG/3ML) 0.083% nebulizer solution 2.5 mg (2.5 mg Nebulization  Given 08/28/21 0918)  furosemide (LASIX) injection 40 mg (40 mg Intravenous Given 08/28/21 0923)  magnesium sulfate IVPB 2 g 50 mL (0 g Intravenous Stopped 08/28/21 1054)  CRITICAL CARE Performed by: Milton Ferguson Total critical care time: 40 minutes Critical care time was exclusive of separately billable procedures and treating other patients. Critical care was necessary to treat or prevent imminent or life-threatening deterioration. Critical care was time spent personally by me on the following activities: development of treatment plan with patient and/or surrogate as well as nursing, discussions with consultants, evaluation of patient's response to treatment, examination of patient, obtaining history from patient or  surrogate, ordering and performing treatments and interventions, ordering and review of laboratory studies, ordering and review of radiographic studies, pulse oximetry and re-evaluation of patient's condition.   ED Course  I have reviewed the triage vital signs and the nursing notes.  Pertinent labs & imaging results that were available during my care of the patient were reviewed by me and considered in my medical decision making (see chart for details).  Patient with severe COPD.  Patient improved with neb treatment steroids and placement on BiPAP.  At admission time he was slightly sleepy but will awaken and answer your questions appropriately.  ABG shows PCO2 73 but pH 732 MDM Rules/Calculators/A&P                           Patient with severe exacerbation of COPD he will be admitted to medicine and continue BiPAP for now Final Clinical Impression(s) / ED Diagnoses Final diagnoses:  COPD exacerbation [facility])    Rx / DC Orders ED Discharge Orders     None        Milton Ferguson, MD 08/28/21 1148

## 2021-08-28 NOTE — ED Notes (Signed)
Delay in blood cultures due to poor access. Lab has tried as well as nursing. Other lab staff to try at this time.

## 2021-08-28 NOTE — ED Notes (Signed)
Patient refusing to allow this nurse to start second IV.

## 2021-08-28 NOTE — Progress Notes (Signed)
Elink following code sepsis °

## 2021-08-29 ENCOUNTER — Other Ambulatory Visit: Payer: Self-pay

## 2021-08-29 LAB — CBC
HCT: 34.1 % — ABNORMAL LOW (ref 39.0–52.0)
Hemoglobin: 9.7 g/dL — ABNORMAL LOW (ref 13.0–17.0)
MCH: 24 pg — ABNORMAL LOW (ref 26.0–34.0)
MCHC: 28.4 g/dL — ABNORMAL LOW (ref 30.0–36.0)
MCV: 84.4 fL (ref 80.0–100.0)
Platelets: 247 10*3/uL (ref 150–400)
RBC: 4.04 MIL/uL — ABNORMAL LOW (ref 4.22–5.81)
RDW: 17.8 % — ABNORMAL HIGH (ref 11.5–15.5)
WBC: 18.2 10*3/uL — ABNORMAL HIGH (ref 4.0–10.5)
nRBC: 0 % (ref 0.0–0.2)

## 2021-08-29 LAB — BASIC METABOLIC PANEL
Anion gap: 11 (ref 5–15)
BUN: 33 mg/dL — ABNORMAL HIGH (ref 8–23)
CO2: 33 mmol/L — ABNORMAL HIGH (ref 22–32)
Calcium: 9 mg/dL (ref 8.9–10.3)
Chloride: 89 mmol/L — ABNORMAL LOW (ref 98–111)
Creatinine, Ser: 1.18 mg/dL (ref 0.61–1.24)
GFR, Estimated: >60 mL/min (ref >60)
Glucose, Bld: 309 mg/dL — ABNORMAL HIGH (ref 70–99)
Potassium: 4 mmol/L (ref 3.5–5.1)
Sodium: 133 mmol/L — ABNORMAL LOW (ref 135–145)

## 2021-08-29 LAB — PROCALCITONIN: Procalcitonin: 0.14 ng/mL

## 2021-08-29 LAB — GLUCOSE, CAPILLARY
Glucose-Capillary: 298 mg/dL — ABNORMAL HIGH (ref 70–99)
Glucose-Capillary: 280 mg/dL — ABNORMAL HIGH (ref 70–99)
Glucose-Capillary: 268 mg/dL — ABNORMAL HIGH (ref 70–99)
Glucose-Capillary: 242 mg/dL — ABNORMAL HIGH (ref 70–99)

## 2021-08-29 MED ORDER — MELATONIN 3 MG PO TABS
6.0000 mg | ORAL_TABLET | Freq: Once | ORAL | Status: AC
  Administered 2021-08-29: 6 mg via ORAL
  Filled 2021-08-29: qty 2

## 2021-08-29 MED ORDER — INSULIN GLARGINE-YFGN 100 UNIT/ML ~~LOC~~ SOLN
10.0000 [IU] | Freq: Every day | SUBCUTANEOUS | Status: DC
Start: 2021-08-29 — End: 2021-08-30
  Administered 2021-08-29: 10 [IU] via SUBCUTANEOUS

## 2021-08-29 MED ORDER — LIVING BETTER WITH HEART FAILURE BOOK: Freq: Once | Status: AC

## 2021-08-29 MED ORDER — INSULIN GLARGINE-YFGN 100 UNIT/ML ~~LOC~~ SOLN
SUBCUTANEOUS | Status: AC
  Filled 2021-08-29: qty 10

## 2021-08-29 MED ORDER — INSULIN ASPART 100 UNIT/ML IJ SOLN
4.0000 [IU] | Freq: Three times a day (TID) | INTRAMUSCULAR | Status: DC
  Administered 2021-08-30 (×2): 4 [IU] via SUBCUTANEOUS

## 2021-08-29 MED ORDER — ORAL CARE MOUTH RINSE
15.0000 mL | Freq: Two times a day (BID) | OROMUCOSAL | Status: DC
  Administered 2021-08-29 – 2021-08-31 (×3): 15 mL via OROMUCOSAL

## 2021-08-29 NOTE — Care Management Important Message (Signed)
Important Message  Patient Details  Name: Gregory Alvarado MRN: [id number] Date of Birth: 1951-01-29   Medicare Important Message Given:  Yes     [name] 08/29/2021, 11:57 AM

## 2021-08-29 NOTE — Progress Notes (Signed)
PROGRESS NOTE  Gregory Alvarado SFK:[id number] DOB: [birth year] DOA: 08/28/2021 PCP: Celene Squibb, MD  Brief History:   70 y.o. male with medical history of COPD, tobacco abuse, dCHF, GIB (gastric ulcers on EGD 10/2019) , HTN, HLD, DM2, poor compliance presenting with shortness of breath that started on 08/27/2021.  The patient has noted increasing lower extremity edema.  He denies worsening orthopnea or increasing abdominal girth.  He continues to smoke 1 pack/day.  He has a nearly 100-pack-year history of tobacco.  He denies any fevers, chills, headache, neck pain, chest pain, nausea, vomiting, diarrhea, abdominal pain, dysuria, hematuria.  There is not been any hematochezia or melena. He states that he has not been taking his Lasix every day.,  But states that he has been compliant with his other medications. Because of worsening shortness of breath, he activated EMS.  He was noted to have oxygen saturation of 77% on room air.  He was placed on CPAP, but was very somnolent at the time of arrival in the emergency department.  He was placed on BiPAP and started on IV Lasix and Solu-Medrol.  His mentation improved. ED The patient had low-grade temperature of 99.3 F but was hemodynamically stable.  Oxygen saturation was 96% on BiPAP FiO2 40.  BMP showed sodium 135, potassium 4.9, bicarb 36, serum creatinine 1.4.  LFTs were unremarkable.  WBC 14.5, hemoglobin 10.6, platelets 252,000.  BNP was 487.  Chest x-ray showed increased interstitial markings.  ABG showed 7.3 2/72/59 on 40%.  Assessment/Plan: Acute on chronic respiratory failure with hypoxia and hypercarbia -secondary to COPD exacerbation and CHF -Initially placed on BiPAP>>5L -Patient is chronically on 2 L at home -Wean oxygen back to 2 L as tolerated for saturation greater 90%   COPD exacerbation -continue pulmicort -continue duonebs -continue IV solumedrol   Acute on chronic diastolic CHF -Continue IV furosemide -Daily  weights -Accurate I's and O's -12/19/2020 echo EF 60-65%, + WMA, mild MR -08/28/21 Echo--EF 55-60%,+WMA, G1DD   Coronary artery disease -Chest pain presently -EKG without concerning ischemic changes 09/12/20 cath: LM LIs, LAD prox 70% and 90% mid, LCX 80% prox with thrombus, OM2 85%, RCA small non dom 60%. LVEDP elevated but number not given,  - received DES to prox/mid LAD, DES to prox LCX, DES to OM2.  08/2020 echo: LVEF 35-40% grade II DDx, mild MR -continue ASA and Effient  Elevated troponins -due to demand ischemia -no chest pain   Obesity class II -BMI 39.95 -Lifestyle modification   DM2 -07/14/2021 A1c 6.4 -NovoLog sliding scale -start semglee 10 units -novolog 4 units with meals   Essential hypertension -Continue metoprolol succinate   Hyperlipidemia -continue statin   Tobacco abuse -cessation discussed        Status is: Inpatient  Remains inpatient appropriate because: severity of illness requiring IV lasix and solumedrol      Family Communication:   Family at bedside  Consultants:  none  Code Status:  FULL / DNR  DVT Prophylaxis:  [place] Heparin / [place] Lovenox   Procedures: As Listed in Progress Note Above  Antibiotics: None        Subjective: Patient is breathing better, but still has dyspnea on exertion.   Denies f/c, cp, n/v/d, abd pain Objective: Vitals:   08/29/21 0421 08/29/21 0500 08/29/21 0744 08/29/21 1326  BP: (!) 171/66  (!) 171/76 (!) 164/83  Pulse: 92  92 91  Resp: 20  20 20  Temp: 98.5 F (36.9 C)  98 F (36.7 C) 98.3 F (36.8 C)  TempSrc: Oral  Oral Oral  SpO2: 96%  94% 100%  Weight:  123.4 kg 133.6 kg   Height:        Intake/Output Summary (Last 24 hours) at 08/29/2021 1753 Last data filed at 08/29/2021 1300 Gross per 24 hour  Intake 720 ml  Output 450 ml  Net 270 ml   Weight change:  Exam:  General:  Pt is alert, follows commands appropriately, not in acute distress HEENT: No icterus, No thrush, No  neck mass, [place]/AT Cardiovascular: RRR, S1/S2, no rubs, no gallops Respiratory: bilateral rales.  Bilateral wheeze Abdomen: Soft/+BS, non tender, non distended, no guarding Extremities: 1+  LE edema, No lymphangitis, No petechiae, No rashes, no synovitis   Data Reviewed: I have personally reviewed following labs and imaging studies Basic Metabolic Panel: Recent Labs  Lab 08/28/21 0859 08/29/21 0533  NA 135 133*  K 4.9 4.0  CL 95* 89*  CO2 36* 33*  GLUCOSE 142* 309*  BUN 17 33*  CREATININE 1.14 1.18  CALCIUM 8.9 9.0   Liver Function Tests: Recent Labs  Lab 08/28/21 0859  AST 13*  ALT 12  ALKPHOS 76  BILITOT 0.6  PROT 7.7  ALBUMIN 3.6   No results for input(s): LIPASE, AMYLASE in the last 168 hours. No results for input(s): AMMONIA in the last 168 hours. Coagulation Profile: Recent Labs  Lab 08/28/21 0900  INR 1.0   CBC: Recent Labs  Lab 08/28/21 0859 08/29/21 0533  WBC 14.5* 18.2*  NEUTROABS 12.3*  --   HGB 10.6* 9.7*  HCT 39.5 34.1*  MCV 87.2 84.4  PLT 252 247   Cardiac Enzymes: No results for input(s): CKTOTAL, CKMB, CKMBINDEX, TROPONINI in the last 168 hours. BNP: Invalid input(s): POCBNP CBG: Recent Labs  Lab 08/28/21 1612 08/28/21 2159 08/29/21 0716 08/29/21 1104 08/29/21 1602  GLUCAP 222* 313* 298* 280* 268*   HbA1C: Recent Labs    08/28/21 1052  HGBA1C 6.9*   Urine analysis:    Component Value Date/Time   COLORURINE YELLOW 08/28/2021 [place] 08/28/2021 1025   LABSPEC 1.010 08/28/2021 1025   PHURINE 5.5 08/28/2021 1025   GLUCOSEU NEGATIVE 08/28/2021 1025   HGBUR LARGE (A) 08/28/2021 1025   BILIRUBINUR NEGATIVE 08/28/2021 1025   [employer] 08/28/2021 1025   PROTEINUR NEGATIVE 08/28/2021 1025   NITRITE NEGATIVE 08/28/2021 1025   LEUKOCYTESUR NEGATIVE 08/28/2021 1025   Sepsis Labs: @LABRCNTIP (procalcitonin:4,lacticidven:4) ) Recent Results (from the past 240 hour(s))  Resp Panel by RT-PCR (Flu A&B,  Covid) Nasopharyngeal Swab     Status: None   Collection Time: 08/28/21  9:30 AM   Specimen: Nasopharyngeal Swab; Nasopharyngeal(NP) swabs in vial transport medium  Result Value Ref Range Status   SARS Coronavirus 2 by RT PCR NEGATIVE NEGATIVE Final    Comment: (NOTE) SARS-CoV-2 target nucleic acids are NOT DETECTED.  The SARS-CoV-2 RNA is generally detectable in upper respiratory specimens during the acute phase of infection. The lowest concentration of SARS-CoV-2 viral copies this assay can detect is 138 copies/mL. A negative result does not preclude SARS-Cov-2 infection and should not be used as the sole basis for treatment or other patient management decisions. A negative result may occur with  improper specimen collection/handling, submission of specimen other than nasopharyngeal swab, presence of viral mutation(s) within the areas targeted by this assay, and inadequate number of viral copies(<138 copies/mL). A negative result must be combined with  clinical observations, patient history, and epidemiological information. The expected result is Negative.  Fact Sheet for Patients:  EntrepreneurPulse.com.au  Fact Sheet for Healthcare Providers:  IncredibleEmployment.be  This test is no t yet approved or cleared by the [place] FDA and  has been authorized for detection and/or diagnosis of SARS-CoV-2 by FDA under an Emergency Use Authorization (EUA). This EUA will remain  in effect (meaning this test can be used) for the duration of the COVID-19 declaration under Section 564(b)(1) of the Act, 21 U.S.C.section 360bbb-3(b)(1), unless the authorization is terminated  or revoked sooner.       Influenza A by PCR NEGATIVE NEGATIVE Final   Influenza B by PCR NEGATIVE NEGATIVE Final    Comment: (NOTE) The Xpert Xpress SARS-CoV-2/FLU/RSV plus assay is intended as an aid in the diagnosis of influenza from Nasopharyngeal swab specimens and should  not be used as a sole basis for treatment. Nasal washings and aspirates are unacceptable for Xpert Xpress SARS-CoV-2/FLU/RSV testing.  Fact Sheet for Patients: EntrepreneurPulse.com.au  Fact Sheet for Healthcare Providers: IncredibleEmployment.be  This test is not yet approved or cleared by the [place] FDA and has been authorized for detection and/or diagnosis of SARS-CoV-2 by FDA under an Emergency Use Authorization (EUA). This EUA will remain in effect (meaning this test can be used) for the duration of the COVID-19 declaration under Section 564(b)(1) of the Act, 21 U.S.C. section 360bbb-3(b)(1), unless the authorization is terminated or revoked.  Performed at [facility], [address] [place], [place] [address]   Blood Culture (routine x 2)     Status: None (Preliminary result)   Collection Time: 08/28/21 10:52 AM   Specimen: BLOOD  Result Value Ref Range Status   Specimen Description BLOOD BLOOD LEFT HAND  Final   Special Requests   Final    BOTTLES DRAWN AEROBIC AND ANAEROBIC Blood Culture adequate volume   Culture   Final    NO GROWTH < 24 HOURS Performed at [facility], [address], [place], [place] [address]    Report Status PENDING  Incomplete  Blood Culture (routine x 2)     Status: None (Preliminary result)   Collection Time: 08/28/21 10:52 AM   Specimen: BLOOD  Result Value Ref Range Status   Specimen Description BLOOD BLOOD LEFT FOREARM  Final   Special Requests   Final    BOTTLES DRAWN AEROBIC AND ANAEROBIC Blood Culture adequate volume   Culture   Final    NO GROWTH < 24 HOURS Performed at [facility], [address] [place],  [address]    Report Status PENDING  Incomplete     Scheduled Meds:  aspirin EC  81 mg Oral Daily   atorvastatin  40 mg Oral QHS   budesonide (PULMICORT) nebulizer solution  0.5 mg Nebulization BID   enoxaparin (LOVENOX) injection  60 mg Subcutaneous Daily   folic acid  1 mg  Oral Daily   furosemide  40 mg Intravenous BID   gabapentin  300 mg Oral QHS   insulin aspart  0-15 Units Subcutaneous TID WC   insulin aspart  0-5 Units Subcutaneous QHS   ipratropium-albuterol  3 mL Nebulization Q6H   iron polysaccharides  150 mg Oral Daily   mouth rinse  15 mL Mouth Rinse BID   methylPREDNISolone (SOLU-MEDROL) injection  60 mg Intravenous Q12H   metoprolol succinate  50 mg Oral Daily   pantoprazole  40 mg Oral BID   prasugrel  10 mg Oral Daily  sodium chloride flush  3 mL Intravenous Q12H   spironolactone  25 mg Oral Daily   Continuous Infusions:  sodium chloride     azithromycin 500 mg (08/29/21 1026)   cefTRIAXone (ROCEPHIN)  IV 2 g (08/29/21 0817)    Procedures/Studies: DG Chest Port 1 View  Result Date: 08/28/2021 CLINICAL DATA:  Shortness of breath. EXAM: PORTABLE CHEST 1 VIEW COMPARISON:  01/06/2021 FINDINGS: 0908 hours. The lungs are clear without focal pneumonia, edema, pneumothorax or pleural effusion. Interstitial markings are diffusely coarsened with chronic features. The cardio pericardial silhouette is enlarged. The visualized bony structures of the thorax show no acute abnormality. Telemetry leads overlie the chest. IMPRESSION: No acute cardiopulmonary findings. Chronic interstitial coarsening. Electronically Signed   By: Misty Stanley M.D.   On: 08/28/2021 09:31   ECHOCARDIOGRAM COMPLETE  Result Date: 08/28/2021    ECHOCARDIOGRAM REPORT   Patient Name:   ELDRIGE PITKIN Date of Exam: 08/28/2021 Medical Rec #:  [id number]     Height:       72.0 in Accession #:    [id number]    Weight:       285.0 lb Date of Birth:  [birth year]     BSA:          2.477 m Patient Age:    72 years      BP:           93/48 mmHg Patient Gender: M             HR:           102 bpm. Exam Location:  [name] Procedure: 2D Echo, Cardiac Doppler, Color Doppler and Intracardiac            Opacification Agent Indications:    CHF  History:        Patient has prior history of  Echocardiogram examinations, most                 recent 12/19/2020. CHF, Previous Myocardial Infarction and CAD,                 COPD; Risk Factors:Hypertension, Diabetes, Dyslipidemia and                 Current Smoker. Stents x3 in December 2021.  Sonographer:    Wenda Low Referring Phys: [phone] Erle Guster  Sonographer Comments: Patient is morbidly obese. IMPRESSIONS  1. Left ventricular ejection fraction, by estimation, is 55 to 60%. The left ventricle has normal function. The left ventricle demonstrates regional wall motion abnormalities (see scoring diagram/findings for description). There is moderate concentric left ventricular hypertrophy. Left ventricular diastolic parameters are consistent with Grade I diastolic dysfunction (impaired relaxation). Elevated left ventricular end-diastolic pressure.  2. Right ventricular systolic function is normal. The right ventricular size is normal. There is normal pulmonary artery systolic pressure. The estimated right ventricular systolic pressure is 39.7 mmHg.  3. Left atrial size was mildly dilated.  4. A small pericardial effusion is present. The pericardial effusion is localized near the right atrium and posterior to the left ventricle.  5. The mitral valve is grossly normal. Mild mitral valve regurgitation.  6. The aortic valve is tricuspid. Aortic valve regurgitation is not visualized. Aortic valve sclerosis is present, with no evidence of aortic valve stenosis. Aortic valve mean gradient measures 5.0 mmHg.  7. The inferior vena cava is dilated in size with >50% respiratory variability, suggesting right atrial pressure of 8 mmHg. Comparison(s): Prior images reviewed side by side.  LVEF preserved with wall motion abnormalities consistent with ischemic heart disease - previous study quality was not optimal, but these wall motion abnormalities were also present to some degree as well. FINDINGS  Left Ventricle: Left ventricular ejection fraction, by estimation, is 55  to 60%. The left ventricle has normal function. The left ventricle demonstrates regional wall motion abnormalities. Definity contrast agent was given IV to delineate the left ventricular endocardial borders. The left ventricular internal cavity size was normal in size. There is moderate concentric left ventricular hypertrophy. Left ventricular diastolic parameters are consistent with Grade I diastolic dysfunction (impaired relaxation). Elevated left ventricular end-diastolic pressure.  LV Wall Scoring: The mid and distal anterior septum is akinetic. The apical lateral segment, apical anterior segment, apical inferior segment, and apex are hypokinetic. The anterior wall, antero-lateral wall, inferior wall, posterior wall, basal anteroseptal segment, mid inferoseptal segment, and basal inferoseptal segment are normal. Right Ventricle: The right ventricular size is normal. No increase in right ventricular wall thickness. Right ventricular systolic function is normal. There is normal pulmonary artery systolic pressure. The tricuspid regurgitant velocity is 2.08 m/s, and  with an assumed right atrial pressure of 8 mmHg, the estimated right ventricular systolic pressure is 26.8 mmHg. Left Atrium: Left atrial size was mildly dilated. Right Atrium: Right atrial size was normal in size. Pericardium: A small pericardial effusion is present. The pericardial effusion is localized near the right atrium and posterior to the left ventricle. Presence of epicardial fat layer. Mitral Valve: The mitral valve is grossly normal. Mild mitral annular calcification. Mild mitral valve regurgitation. MV peak gradient, 10.4 mmHg. The mean mitral valve gradient is 5.0 mmHg. Tricuspid Valve: The tricuspid valve is grossly normal. Tricuspid valve regurgitation is trivial. Aortic Valve: The aortic valve is tricuspid. There is mild aortic valve annular calcification. Aortic valve regurgitation is not visualized. Aortic valve sclerosis is present,  with no evidence of aortic valve stenosis. Aortic valve mean gradient measures  5.0 mmHg. Aortic valve peak gradient measures 9.1 mmHg. Aortic valve area, by VTI measures 3.13 cm. Pulmonic Valve: The pulmonic valve was grossly normal. Pulmonic valve regurgitation is trivial. Aorta: The aortic root is normal in size and structure. Venous: The inferior vena cava is dilated in size with greater than 50% respiratory variability, suggesting right atrial pressure of 8 mmHg. IAS/Shunts: No atrial level shunt detected by color flow Doppler.  LEFT VENTRICLE PLAX 2D LVIDd:         5.30 cm   Diastology LVIDs:         3.60 cm   LV e' medial:    7.18 cm/s LV PW:         1.50 cm   LV E/e' medial:  17.0 LV IVS:        1.40 cm   LV e' lateral:   5.77 cm/s LVOT diam:     2.00 cm   LV E/e' lateral: 21.1 LV SV:         91 LV SV Index:   37 LVOT Area:     3.14 cm  RIGHT VENTRICLE RV Basal diam:  3.95 cm RV Mid diam:    3.40 cm RV S prime:     14.30 cm/s TAPSE (M-mode): 3.3 cm LEFT ATRIUM             Index        RIGHT ATRIUM           Index LA diam:        5.40  cm 2.18 cm/m   RA Area:     23.40 cm LA Vol (A2C):   90.3 ml 36.46 ml/m  RA Volume:   70.20 ml  28.34 ml/m LA Vol (A4C):   96.9 ml 39.13 ml/m LA Biplane Vol: 95.6 ml 38.60 ml/m  AORTIC VALVE                     PULMONIC VALVE AV Area (Vmax):    2.89 cm      PV Vmax:       1.06 m/s AV Area (Vmean):   3.20 cm      PV Peak grad:  4.5 mmHg AV Area (VTI):     3.13 cm AV Vmax:           151.00 cm/s AV Vmean:          99.300 cm/s AV VTI:            0.292 m AV Peak Grad:      9.1 mmHg AV Mean Grad:      5.0 mmHg LVOT Vmax:         139.00 cm/s LVOT Vmean:        101.000 cm/s LVOT VTI:          0.291 m LVOT/AV VTI ratio: 1.00  AORTA Ao Root diam: 3.40 cm Ao Asc diam:  3.20 cm MITRAL VALVE                TRICUSPID VALVE MV Area (PHT): 3.54 cm     TR Peak grad:   17.3 mmHg MV Area VTI:   2.10 cm     TR Vmax:        208.00 cm/s MV Peak grad:  10.4 mmHg MV Mean grad:  5.0 mmHg      SHUNTS MV Vmax:       1.61 m/s     Systemic VTI:  0.29 m MV Vmean:      105.0 cm/s   Systemic Diam: 2.00 cm MV Decel Time: 214 msec MV E velocity: 122.00 cm/s MV A velocity: 142.00 cm/s MV E/A ratio:  0.86 Rozann Lesches MD Electronically signed by Rozann Lesches MD Signature Date/Time: 08/28/2021/4:47:16 PM    Final     Orson Eva, DO  [facility]  If 7PM-7AM, please contact night-coverage [email or web address] Password [email or web address] 08/29/2021, 5:53 PM   LOS: 1 day

## 2021-08-29 NOTE — Progress Notes (Signed)
[name] to his right upper arm had infiltrated with swelling at the site. Removed. No redness or pain. Placed a new Gregory Alvarado to the left arm, one stick, tolerated well.

## 2021-08-29 NOTE — TOC Initial Note (Signed)
Transition of Care [facility]) - Initial/Assessment Note    Patient Details  Name: Gregory Alvarado MRN: [id number] Date of Birth: 1951/09/03  [facility] CM/SW Contact:    Elliot Gault, LCSW Phone Number: 08/29/2021, 12:56 PM  Clinical Narrative:                  Pt admitted from home. Received TOC consult for HF screen. Met with pt at bedside to assess. Per pt, he and his son live together. Pt is independent in ADLs at home. [facility] [employer] is the only DME he uses. Pt states he drives and can get to appointments and obtain medications without difficulty.  When asked if he follows a heart healthy diet, pt stated, "I eat what I want to." Pt states he has not been weighing daily but that he intends to start doing so once he is discharged. Pt is established with a cardiologist. Updated pt that Living Well with CHF brochure had been ordered for pt.  Pt is not interested in [facility] or DME at dc. TOC will follow and be available if needs arise.  Expected Discharge Plan: Home/Self Care Barriers to Discharge: Continued Medical Work up   Patient Goals and CMS Choice Patient states their goals for this hospitalization and ongoing recovery are:: go home      Expected Discharge Plan and Services Expected Discharge Plan: Home/Self Care In-house Referral: Clinical Social Work     Living arrangements for the past 2 months: Single Family Home                                      Prior Living Arrangements/Services Living arrangements for the past 2 months: Single Family Home Lives with:: Adult Children Patient language and need for interpreter reviewed:: Yes Do you feel safe going back to the place where you live?: Yes      Need for Family Participation in Patient Care: Yes (Comment) Care giver support system in place?: Yes (comment)   Criminal Activity/Legal Involvement Pertinent to Current Situation/Hospitalization: No - Comment as needed  Activities of Daily Living Home  Assistive Devices/Equipment: Oxygen ADL Screening (condition at time of admission) Patient's cognitive ability adequate to safely complete daily activities?: Yes Is the patient deaf or have difficulty hearing?: No Does the patient have difficulty seeing, even when wearing glasses/contacts?: No Does the patient have difficulty concentrating, remembering, or making decisions?: No Patient able to express need for assistance with ADLs?: Yes Does the patient have difficulty dressing or bathing?: Yes Independently performs ADLs?: Yes (appropriate for developmental age) Does the patient have difficulty walking or climbing stairs?: Yes Weakness of Legs: Both Weakness of Arms/Hands: None  Permission Sought/Granted                  Emotional Assessment Appearance:: Appears stated age Attitude/Demeanor/Rapport: Engaged Affect (typically observed): Pleasant Orientation: : Oriented to Self, Oriented to Place, Oriented to  Time, Oriented to Situation Alcohol / Substance Use: Not Applicable Psych Involvement: No (comment)  Admission diagnosis:  COPD exacerbation (HCC) [J44.1] Acute on chronic respiratory failure with hypoxia and hypercapnia (HCC) [D60.51, J96.22] Patient Active Problem List   Diagnosis Date Noted   Acute on chronic respiratory failure with hypoxia and hypercapnia (HCC) 08/28/2021   Acute on chronic combined systolic and diastolic CHF (congestive heart failure) (HCC) 08/28/2021   Acute anemia 01/06/2021   SBO (small bowel obstruction) (HCC)  Guaiac positive stools 07/22/2020   Folate deficiency 07/22/2020   History of colonic polyps 07/16/2020   Chronic diastolic CHF (congestive heart failure) ([place]) 07/09/2020   Exertional angina ([place]) 07/09/2020   CRI (chronic renal insufficiency), stage 2 (mild) 07/09/2020   Colon cancer screening 01/23/2020   PUD (peptic ulcer disease) 01/23/2020   Esophageal dysphagia 01/23/2020   H. pylori infection 01/23/2020   GI bleed  10/27/2019   Rectal bleeding    Symptomatic anemia 10/26/2019   Obesity, Class III, BMI 40-49.9 (morbid obesity) ([place]) 10/26/2019   Iron deficiency anemia    Melena    Constipation    Personal history of noncompliance with medical treatment, presenting hazards to health 02/24/2017   Class 2 obesity due to excess calories with body mass index (BMI) of 39.0 to 39.9 in adult 01/05/2017   Current smoker 01/05/2017   Essential hypertension    Insulin dependent type 2 diabetes mellitus ([place])    COPD (chronic obstructive pulmonary disease) ([place])    Arthritis    Hyperlipidemia    Insomnia    PCP:  Celene Squibb, MD Pharmacy:   [facility] [place] [address] Phone: [phone] Fax: [phone]     Social Determinants of Health (SDOH) Interventions    Readmission Risk Interventions Readmission Risk Prevention Plan 08/29/2021  Transportation Screening Complete  Home Care Screening Complete  Medication Review (RN CM) Complete  Some recent data might be hidden

## 2021-08-29 NOTE — Progress Notes (Signed)
Patient up to the chair to sit for the second time today.

## 2021-08-29 NOTE — Progress Notes (Addendum)
Inpatient Diabetes Program Recommendations  AACE/ADA: New Consensus Statement on Inpatient Glycemic Control (2015)  Target Ranges:  Prepandial:   less than 140 mg/dL      Peak postprandial:   less than 180 mg/dL (1-2 hours)      Critically ill patients:  140 - 180 mg/dL    Latest Reference Range & Units 08/28/21 08:47 08/28/21 16:12 08/28/21 21:59  Glucose-Capillary 70 - 99 mg/dL 137 (H) 222 (H)  5 units Novolog  313 (H)  4 units Novolog     Latest Reference Range & Units 08/29/21 07:16  Glucose-Capillary 70 - 99 mg/dL 298 (H)  8 units Novolog     Latest Reference Range & Units 08/28/21 10:52  Hemoglobin A1C 4.8 - 5.6 % 6.9 (H)       Admit with: Acute on chronic respiratory failure with hypoxia and hypercarbia--secondary to COPD exacerbation and CHF  History: DM, CHF, COPD  Home DM Meds: Novolin 70/30 Insulin 40 units BID  Current Orders: Novolog Moderate Correction Scale/ SSI (0-15 units) TID AC + HS    MD- Note pt getting Solumedrol 60 mg BID.  Takes 70/30 Insulin BID at home.  Only has orders for Novolog SSI at present.  Please consider adding basal insulin:  Recommend Semglee 33 units Daily (0.25units/kg)--Gets about 56 units total of longer-acting insulin in his 2 doses of 70/30 insulin at home  Can restart 70/30 Insulin when pt discharges home     --Will follow patient during hospitalization--  Wyn Quaker RN, MSN, CDE Diabetes Coordinator Inpatient Glycemic Control Team Team Pager: [phone] (8a-5p)

## 2021-08-29 NOTE — Progress Notes (Signed)
Patient found to be on 2.5 L/. Checked pulse ox and he was 99%. Sitting up talking with wife. They were asking about meds he brought home with him. Noted in system meds were "sent home". Meds found to be in a bag on his counter. Patient's wife states she will take them home.

## 2021-08-30 LAB — URINE CULTURE: Culture: NO GROWTH

## 2021-08-30 LAB — GLUCOSE, CAPILLARY
Glucose-Capillary: 308 mg/dL — ABNORMAL HIGH (ref 70–99)
Glucose-Capillary: 305 mg/dL — ABNORMAL HIGH (ref 70–99)
Glucose-Capillary: 277 mg/dL — ABNORMAL HIGH (ref 70–99)
Glucose-Capillary: 244 mg/dL — ABNORMAL HIGH (ref 70–99)

## 2021-08-30 LAB — BASIC METABOLIC PANEL
Anion gap: 12 (ref 5–15)
BUN: 36 mg/dL — ABNORMAL HIGH (ref 8–23)
CO2: 32 mmol/L (ref 22–32)
Calcium: 9.2 mg/dL (ref 8.9–10.3)
Chloride: 90 mmol/L — ABNORMAL LOW (ref 98–111)
Creatinine, Ser: 1.05 mg/dL (ref 0.61–1.24)
GFR, Estimated: >60 mL/min (ref >60)
Glucose, Bld: 306 mg/dL — ABNORMAL HIGH (ref 70–99)
Potassium: 3.9 mmol/L (ref 3.5–5.1)
Sodium: 134 mmol/L — ABNORMAL LOW (ref 135–145)

## 2021-08-30 LAB — MAGNESIUM: Magnesium: 2.1 mg/dL (ref 1.7–2.4)

## 2021-08-30 MED ORDER — MELATONIN 3 MG PO TABS
6.0000 mg | ORAL_TABLET | Freq: Once | ORAL | Status: AC
  Administered 2021-08-30: 6 mg via ORAL
  Filled 2021-08-30: qty 2

## 2021-08-30 MED ORDER — INSULIN GLARGINE-YFGN 100 UNIT/ML ~~LOC~~ SOLN
15.0000 [IU] | Freq: Every day | SUBCUTANEOUS | Status: DC
  Administered 2021-08-30: 15 [IU] via SUBCUTANEOUS
  Filled 2021-08-30 (×2): qty 0.15

## 2021-08-30 MED ORDER — MELATONIN 3 MG PO TABS: 6.0000 mg | ORAL_TABLET | Freq: Once | ORAL | Status: DC

## 2021-08-30 MED ORDER — FUROSEMIDE 40 MG PO TABS
40.0000 mg | ORAL_TABLET | Freq: Every day | ORAL | Status: DC
  Administered 2021-08-31: 10:00:00 40 mg via ORAL
  Filled 2021-08-30: qty 1

## 2021-08-30 MED ORDER — INSULIN ASPART 100 UNIT/ML IJ SOLN
6.0000 [IU] | Freq: Three times a day (TID) | INTRAMUSCULAR | Status: DC
Start: 2021-08-30 — End: 2021-08-31
  Administered 2021-08-30 – 2021-08-31 (×2): 6 [IU] via SUBCUTANEOUS

## 2021-08-30 MED ORDER — METHYLPREDNISOLONE SODIUM SUCC 125 MG IJ SOLR
120.0000 mg | Freq: Every day | INTRAMUSCULAR | Status: DC
  Administered 2021-08-30 – 2021-08-31 (×2): 120 mg via INTRAVENOUS
  Filled 2021-08-30 (×2): qty 2

## 2021-08-30 NOTE — Discharge Summary (Addendum)
PROGRESS NOTE  Gregory Alvarado JOA:[id number] DOB: [birth year] DOA: 08/28/2021 PCP: Celene Squibb, MD   Brief History:   70 y.o. male with medical history of COPD, tobacco abuse, dCHF, GIB (gastric ulcers on EGD 10/2019) , HTN, HLD, DM2, poor compliance presenting with shortness of breath that started on 08/27/2021.  The patient has noted increasing lower extremity edema.  He denies worsening orthopnea or increasing abdominal girth.  He continues to smoke 1 pack/day.  He has a nearly 100-pack-year history of tobacco.  He denies any fevers, chills, headache, neck pain, chest pain, nausea, vomiting, diarrhea, abdominal pain, dysuria, hematuria.  There is not been any hematochezia or melena. He states that he has not been taking his Lasix every day.,  But states that he has been compliant with his other medications. Because of worsening shortness of breath, he activated EMS.  He was noted to have oxygen saturation of 77% on room air.  He was placed on CPAP, but was very somnolent at the time of arrival in the emergency department.  He was placed on BiPAP and started on IV Lasix and Solu-Medrol.  His mentation improved. ED The patient had low-grade temperature of 99.3 F but was hemodynamically stable.  Oxygen saturation was 96% on BiPAP FiO2 40.  BMP showed sodium 135, potassium 4.9, bicarb 36, serum creatinine 1.4.  LFTs were unremarkable.  WBC 14.5, hemoglobin 10.6, platelets 252,000.  BNP was 487.  Chest x-ray showed increased interstitial markings.  ABG showed 7.3 2/72/59 on 40%.   Assessment/Plan: Acute on chronic respiratory failure with hypoxia and hypercarbia -secondary to COPD exacerbation and CHF -Initially placed on BiPAP>>5L>>4L -Patient is chronically on 2 L at home -Wean oxygen back to 2 L as tolerated for saturation greater 90%   COPD exacerbation -continue pulmicort -continue duonebs -continue IV solumedrol   Acute on chronic diastolic CHF -Continue IV furosemide -Daily  weights--NEG 8 lbs -Accurate I's and O's -12/19/2020 echo EF 60-65%, + WMA, mild MR -08/28/21 Echo--EF 55-60%,+WMA, G1DD -plan to transition to po lasix 12/18   Coronary artery disease -Chest pain presently -EKG without concerning ischemic changes 09/12/20 cath: LM LIs, LAD prox 70% and 90% mid, LCX 80% prox with thrombus, OM2 85%, RCA small non dom 60%. LVEDP elevated but number not given,  - received DES to prox/mid LAD, DES to prox LCX, DES to OM2.  08/2020 echo: LVEF 35-40% grade II DDx, mild MR -continue ASA and Effient   Elevated troponins -due to demand ischemia -no chest pain   Obesity class II -BMI 39.95 -Lifestyle modification   DM2 -07/14/2021 A1c 6.4 -NovoLog sliding scale -increase semglee 15 units -novolog 6 units with meals   Essential hypertension -Continue metoprolol succinate   Hyperlipidemia -continue statin   Tobacco abuse -cessation discussed             Status is: Inpatient   Remains inpatient appropriate because: severity of illness requiring IV lasix and solumedrol           Family Communication:   no Family at bedside   Consultants:  none   Code Status:  FULL   DVT Prophylaxis:  [place] Lovenox     Procedures: As Listed in Progress Note Above   Antibiotics: None   Subjective: Patient is breathing better.  Still has some dyspnea on exertion.  Denies f/c, cp, n/v/d, abd pain  Objective: Vitals:   08/30/21 0411 08/30/21 0618 08/30/21 0856 08/30/21 0900  BP:  (!) 177/88    Pulse:  88    Resp:      Temp:  97.6 F (36.4 C)    TempSrc:  Oral    SpO2:  95% 94%   Weight: 132.1 kg   130.1 kg  Height:        Intake/Output Summary (Last 24 hours) at 08/30/2021 1217 Last data filed at 08/30/2021 0849 Gross per 24 hour  Intake 700 ml  Output --  Net 700 ml   Weight change: 4.325 kg Exam:  General:  Pt is alert, follows commands appropriately, not in acute distress HEENT: No icterus, No thrush, No neck mass,  [place]/AT Cardiovascular: RRR, S1/S2, no rubs, no gallops Respiratory: bibasilar rales. No wheeze Abdomen: Soft/+BS, non tender, non distended, no guarding Extremities: trace LE edema, No lymphangitis, No petechiae, No rashes, no synovitis   Data Reviewed: I have personally reviewed following labs and imaging studies Basic Metabolic Panel: Recent Labs  Lab 08/28/21 0859 08/29/21 0533 08/30/21 0435  NA 135 133* 134*  K 4.9 4.0 3.9  CL 95* 89* 90*  CO2 36* 33* 32  GLUCOSE 142* 309* 306*  BUN 17 33* 36*  CREATININE 1.14 1.18 1.05  CALCIUM 8.9 9.0 9.2  MG  --   --  2.1   Liver Function Tests: Recent Labs  Lab 08/28/21 0859  AST 13*  ALT 12  ALKPHOS 76  BILITOT 0.6  PROT 7.7  ALBUMIN 3.6   No results for input(s): LIPASE, AMYLASE in the last 168 hours. No results for input(s): AMMONIA in the last 168 hours. Coagulation Profile: Recent Labs  Lab 08/28/21 0900  INR 1.0   CBC: Recent Labs  Lab 08/28/21 0859 08/29/21 0533  WBC 14.5* 18.2*  NEUTROABS 12.3*  --   HGB 10.6* 9.7*  HCT 39.5 34.1*  MCV 87.2 84.4  PLT 252 247   Cardiac Enzymes: No results for input(s): CKTOTAL, CKMB, CKMBINDEX, TROPONINI in the last 168 hours. BNP: Invalid input(s): POCBNP CBG: Recent Labs  Lab 08/29/21 1104 08/29/21 1602 08/29/21 2215 08/30/21 0701 08/30/21 1147  GLUCAP 280* 268* 242* 308* 305*   HbA1C: Recent Labs    08/28/21 1052  HGBA1C 6.9*   Urine analysis:    Component Value Date/Time   COLORURINE YELLOW 08/28/2021 [place] 08/28/2021 1025   LABSPEC 1.010 08/28/2021 1025   PHURINE 5.5 08/28/2021 1025   GLUCOSEU NEGATIVE 08/28/2021 1025   HGBUR LARGE (A) 08/28/2021 1025   BILIRUBINUR NEGATIVE 08/28/2021 1025   [place] 08/28/2021 1025   PROTEINUR NEGATIVE 08/28/2021 1025   NITRITE NEGATIVE 08/28/2021 1025   LEUKOCYTESUR NEGATIVE 08/28/2021 1025   Sepsis Labs: @LABRCNTIP (procalcitonin:4,lacticidven:4) ) Recent Results (from the  past 240 hour(s))  Resp Panel by RT-PCR (Flu A&B, Covid) Nasopharyngeal Swab     Status: None   Collection Time: 08/28/21  9:30 AM   Specimen: Nasopharyngeal Swab; Nasopharyngeal(NP) swabs in vial transport medium  Result Value Ref Range Status   SARS Coronavirus 2 by RT PCR NEGATIVE NEGATIVE Final    Comment: (NOTE) SARS-CoV-2 target nucleic acids are NOT DETECTED.  The SARS-CoV-2 RNA is generally detectable in upper respiratory specimens during the acute phase of infection. The lowest concentration of SARS-CoV-2 viral copies this assay can detect is 138 copies/mL. A negative result does not preclude SARS-Cov-2 infection and should not be used as the sole basis for treatment or other patient management decisions. A negative result may occur with  improper specimen collection/handling, submission of  specimen other than nasopharyngeal swab, presence of viral mutation(s) within the areas targeted by this assay, and inadequate number of viral copies(<138 copies/mL). A negative result must be combined with clinical observations, patient history, and epidemiological information. The expected result is Negative.  Fact Sheet for Patients:  EntrepreneurPulse.com.au  Fact Sheet for Healthcare Providers:  IncredibleEmployment.be  This test is no t yet approved or cleared by the [place] FDA and  has been authorized for detection and/or diagnosis of SARS-CoV-2 by FDA under an Emergency Use Authorization (EUA). This EUA will remain  in effect (meaning this test can be used) for the duration of the COVID-19 declaration under Section 564(b)(1) of the Act, 21 U.S.C.section 360bbb-3(b)(1), unless the authorization is terminated  or revoked sooner.       Influenza A by PCR NEGATIVE NEGATIVE Final   Influenza B by PCR NEGATIVE NEGATIVE Final    Comment: (NOTE) The Xpert Xpress SARS-CoV-2/FLU/RSV plus assay is intended as an aid in the diagnosis of  influenza from Nasopharyngeal swab specimens and should not be used as a sole basis for treatment. Nasal washings and aspirates are unacceptable for Xpert Xpress SARS-CoV-2/FLU/RSV testing.  Fact Sheet for Patients: EntrepreneurPulse.com.au  Fact Sheet for Healthcare Providers: IncredibleEmployment.be  This test is not yet approved or cleared by the [place] FDA and has been authorized for detection and/or diagnosis of SARS-CoV-2 by FDA under an Emergency Use Authorization (EUA). This EUA will remain in effect (meaning this test can be used) for the duration of the COVID-19 declaration under Section 564(b)(1) of the Act, 21 U.S.C. section 360bbb-3(b)(1), unless the authorization is terminated or revoked.  Performed at [facility], [address], [place], [place] [address]   Urine Culture     Status: None   Collection Time: 08/28/21 10:25 AM   Specimen: Urine, Catheterized  Result Value Ref Range Status   Specimen Description   Final    URINE, CATHETERIZED Performed at [facility], [address] [place], [place] [address]    Special Requests   Final    NONE Performed at [facility], [address] [place], [place] [address]    Culture   Final    NO GROWTH Performed at [facility], [place] [address] [place], [place] [address]    Report Status 08/30/2021 FINAL  Final  Blood Culture (routine x 2)     Status: None (Preliminary result)   Collection Time: 08/28/21 10:52 AM   Specimen: BLOOD  Result Value Ref Range Status   Specimen Description BLOOD BLOOD LEFT HAND  Final   Special Requests   Final    BOTTLES DRAWN AEROBIC AND ANAEROBIC Blood Culture adequate volume   Culture   Final    NO GROWTH 2 DAYS Performed at [facility], [address] [place], [place] [address]    Report Status PENDING  Incomplete  Blood Culture (routine x 2)     Status: None (Preliminary result)   Collection Time: 08/28/21 10:52 AM   Specimen:  BLOOD  Result Value Ref Range Status   Specimen Description BLOOD BLOOD LEFT FOREARM  Final   Special Requests   Final    BOTTLES DRAWN AEROBIC AND ANAEROBIC Blood Culture adequate volume   Culture   Final    NO GROWTH 2 DAYS Performed at [facility], [address], [place], [place] [address]    Report Status PENDING  Incomplete     Scheduled Meds:  aspirin EC  81 mg Oral Daily  atorvastatin  40 mg Oral QHS   budesonide (PULMICORT) nebulizer solution  0.5 mg Nebulization BID   enoxaparin (LOVENOX) injection  60 mg Subcutaneous Daily   folic acid  1 mg Oral Daily   furosemide  40 mg Intravenous BID   gabapentin  300 mg Oral QHS   insulin aspart  0-15 Units Subcutaneous TID WC   insulin aspart  0-5 Units Subcutaneous QHS   insulin aspart  4 Units Subcutaneous TID WC   insulin glargine-yfgn  10 Units Subcutaneous QHS   ipratropium-albuterol  3 mL Nebulization Q6H   iron polysaccharides  150 mg Oral Daily   mouth rinse  15 mL Mouth Rinse BID   melatonin  6 mg Oral Once   methylPREDNISolone (SOLU-MEDROL) injection  120 mg Intravenous Daily   metoprolol succinate  50 mg Oral Daily   pantoprazole  40 mg Oral BID   prasugrel  10 mg Oral Daily   sodium chloride flush  3 mL Intravenous Q12H   spironolactone  25 mg Oral Daily   Continuous Infusions:  sodium chloride     azithromycin 500 mg (08/30/21 0943)   cefTRIAXone (ROCEPHIN)  IV 2 g (08/30/21 0849)    Procedures/Studies: DG Chest Port 1 View  Result Date: 08/28/2021 CLINICAL DATA:  Shortness of breath. EXAM: PORTABLE CHEST 1 VIEW COMPARISON:  01/06/2021 FINDINGS: 0908 hours. The lungs are clear without focal pneumonia, edema, pneumothorax or pleural effusion. Interstitial markings are diffusely coarsened with chronic features. The cardio pericardial silhouette is enlarged. The visualized bony structures of the thorax show no acute abnormality. Telemetry leads overlie the chest. IMPRESSION: No acute cardiopulmonary findings.  Chronic interstitial coarsening. Electronically Signed   By: Misty Stanley M.D.   On: 08/28/2021 09:31   ECHOCARDIOGRAM COMPLETE  Result Date: 08/28/2021    ECHOCARDIOGRAM REPORT   Patient Name:   DAWUD MAYS Date of Exam: 08/28/2021 Medical Rec #:  [id number]     Height:       72.0 in Accession #:    [id number]    Weight:       285.0 lb Date of Birth:  1951-09-09     BSA:          2.477 m Patient Age:    68 years      BP:           93/48 mmHg Patient Gender: M             HR:           102 bpm. Exam Location:  [name] Procedure: 2D Echo, Cardiac Doppler, Color Doppler and Intracardiac            Opacification Agent Indications:    CHF  History:        Patient has prior history of Echocardiogram examinations, most                 recent 12/19/2020. CHF, Previous Myocardial Infarction and CAD,                 COPD; Risk Factors:Hypertension, Diabetes, Dyslipidemia and                 Current Smoker. Stents x3 in December 2021.  Sonographer:    Wenda Low Referring Phys: [phone] [place]  Sonographer Comments: Patient is morbidly obese. IMPRESSIONS  1. Left ventricular ejection fraction, by estimation, is 55 to 60%. The left ventricle has normal function. The left ventricle demonstrates regional wall motion abnormalities (see  scoring diagram/findings for description). There is moderate concentric left ventricular hypertrophy. Left ventricular diastolic parameters are consistent with Grade I diastolic dysfunction (impaired relaxation). Elevated left ventricular end-diastolic pressure.  2. Right ventricular systolic function is normal. The right ventricular size is normal. There is normal pulmonary artery systolic pressure. The estimated right ventricular systolic pressure is 16.1 mmHg.  3. Left atrial size was mildly dilated.  4. A small pericardial effusion is present. The pericardial effusion is localized near the right atrium and posterior to the left ventricle.  5. The mitral valve is grossly normal.  Mild mitral valve regurgitation.  6. The aortic valve is tricuspid. Aortic valve regurgitation is not visualized. Aortic valve sclerosis is present, with no evidence of aortic valve stenosis. Aortic valve mean gradient measures 5.0 mmHg.  7. The inferior vena cava is dilated in size with >50% respiratory variability, suggesting right atrial pressure of 8 mmHg. Comparison(s): Prior images reviewed side by side. LVEF preserved with wall motion abnormalities consistent with ischemic heart disease - previous study quality was not optimal, but these wall motion abnormalities were also present to some degree as well. FINDINGS  Left Ventricle: Left ventricular ejection fraction, by estimation, is 55 to 60%. The left ventricle has normal function. The left ventricle demonstrates regional wall motion abnormalities. Definity contrast agent was given IV to delineate the left ventricular endocardial borders. The left ventricular internal cavity size was normal in size. There is moderate concentric left ventricular hypertrophy. Left ventricular diastolic parameters are consistent with Grade I diastolic dysfunction (impaired relaxation). Elevated left ventricular end-diastolic pressure.  LV Wall Scoring: The mid and distal anterior septum is akinetic. The apical lateral segment, apical anterior segment, apical inferior segment, and apex are hypokinetic. The anterior wall, antero-lateral wall, inferior wall, posterior wall, basal anteroseptal segment, mid inferoseptal segment, and basal inferoseptal segment are normal. Right Ventricle: The right ventricular size is normal. No increase in right ventricular wall thickness. Right ventricular systolic function is normal. There is normal pulmonary artery systolic pressure. The tricuspid regurgitant velocity is 2.08 m/s, and  with an assumed right atrial pressure of 8 mmHg, the estimated right ventricular systolic pressure is 09.6 mmHg. Left Atrium: Left atrial size was mildly dilated.  Right Atrium: Right atrial size was normal in size. Pericardium: A small pericardial effusion is present. The pericardial effusion is localized near the right atrium and posterior to the left ventricle. Presence of epicardial fat layer. Mitral Valve: The mitral valve is grossly normal. Mild mitral annular calcification. Mild mitral valve regurgitation. MV peak gradient, 10.4 mmHg. The mean mitral valve gradient is 5.0 mmHg. Tricuspid Valve: The tricuspid valve is grossly normal. Tricuspid valve regurgitation is trivial. Aortic Valve: The aortic valve is tricuspid. There is mild aortic valve annular calcification. Aortic valve regurgitation is not visualized. Aortic valve sclerosis is present, with no evidence of aortic valve stenosis. Aortic valve mean gradient measures  5.0 mmHg. Aortic valve peak gradient measures 9.1 mmHg. Aortic valve area, by VTI measures 3.13 cm. Pulmonic Valve: The pulmonic valve was grossly normal. Pulmonic valve regurgitation is trivial. Aorta: The aortic root is normal in size and structure. Venous: The inferior vena cava is dilated in size with greater than 50% respiratory variability, suggesting right atrial pressure of 8 mmHg. IAS/Shunts: No atrial level shunt detected by color flow Doppler.  LEFT VENTRICLE PLAX 2D LVIDd:         5.30 cm   Diastology LVIDs:         3.60 cm  LV e' medial:    7.18 cm/s LV PW:         1.50 cm   LV E/e' medial:  17.0 LV IVS:        1.40 cm   LV e' lateral:   5.77 cm/s LVOT diam:     2.00 cm   LV E/e' lateral: 21.1 LV SV:         91 LV SV Index:   37 LVOT Area:     3.14 cm  RIGHT VENTRICLE RV Basal diam:  3.95 cm RV Mid diam:    3.40 cm RV S prime:     14.30 cm/s TAPSE (M-mode): 3.3 cm LEFT ATRIUM             Index        RIGHT ATRIUM           Index LA diam:        5.40 cm 2.18 cm/m   RA Area:     23.40 cm LA Vol (A2C):   90.3 ml 36.46 ml/m  RA Volume:   70.20 ml  28.34 ml/m LA Vol (A4C):   96.9 ml 39.13 ml/m LA Biplane Vol: 95.6 ml 38.60 ml/m   AORTIC VALVE                     PULMONIC VALVE AV Area (Vmax):    2.89 cm      PV Vmax:       1.06 m/s AV Area (Vmean):   3.20 cm      PV Peak grad:  4.5 mmHg AV Area (VTI):     3.13 cm AV Vmax:           151.00 cm/s AV Vmean:          99.300 cm/s AV VTI:            0.292 m AV Peak Grad:      9.1 mmHg AV Mean Grad:      5.0 mmHg LVOT Vmax:         139.00 cm/s LVOT Vmean:        101.000 cm/s LVOT VTI:          0.291 m LVOT/AV VTI ratio: 1.00  AORTA Ao Root diam: 3.40 cm Ao Asc diam:  3.20 cm MITRAL VALVE                TRICUSPID VALVE MV Area (PHT): 3.54 cm     TR Peak grad:   17.3 mmHg MV Area VTI:   2.10 cm     TR Vmax:        208.00 cm/s MV Peak grad:  10.4 mmHg MV Mean grad:  5.0 mmHg     SHUNTS MV Vmax:       1.61 m/s     Systemic VTI:  0.29 m MV Vmean:      105.0 cm/s   Systemic Diam: 2.00 cm MV Decel Time: 214 msec MV E velocity: 122.00 cm/s MV A velocity: 142.00 cm/s MV E/A ratio:  0.86 Rozann Lesches MD Electronically signed by Rozann Lesches MD Signature Date/Time: 08/28/2021/4:47:16 PM    Final     Orson Eva, DO  [facility]  If 7PM-7AM, please contact night-coverage [email or web address] Password [email or web address] 08/30/2021, 12:17 PM   LOS: 2 days

## 2021-08-31 ENCOUNTER — Encounter (HOSPITAL_COMMUNITY): Payer: Self-pay | Admitting: Internal Medicine

## 2021-08-31 LAB — BASIC METABOLIC PANEL
Anion gap: 9 (ref 5–15)
BUN: 41 mg/dL — ABNORMAL HIGH (ref 8–23)
CO2: 36 mmol/L — ABNORMAL HIGH (ref 22–32)
Calcium: 9.3 mg/dL (ref 8.9–10.3)
Chloride: 90 mmol/L — ABNORMAL LOW (ref 98–111)
Creatinine, Ser: 1.08 mg/dL (ref 0.61–1.24)
GFR, Estimated: >60 mL/min (ref >60)
Glucose, Bld: 211 mg/dL — ABNORMAL HIGH (ref 70–99)
Potassium: 3.5 mmol/L (ref 3.5–5.1)
Sodium: 135 mmol/L (ref 135–145)

## 2021-08-31 LAB — MAGNESIUM: Magnesium: 2.3 mg/dL (ref 1.7–2.4)

## 2021-08-31 LAB — GLUCOSE, CAPILLARY: Glucose-Capillary: 211 mg/dL — ABNORMAL HIGH (ref 70–99)

## 2021-08-31 MED ORDER — PREDNISONE 20 MG PO TABS: 60.0000 mg | ORAL_TABLET | Freq: Every day | ORAL | Status: DC

## 2021-08-31 MED ORDER — FUROSEMIDE 40 MG PO TABS: 40.0000 mg | ORAL_TABLET | Freq: Every day | ORAL | 1 refills | Status: DC

## 2021-08-31 MED ORDER — PREDNISONE 10 MG PO TABS: 60.0000 mg | ORAL_TABLET | Freq: Every day | ORAL | 0 refills | Status: DC

## 2021-08-31 NOTE — Clinical Social Work Note (Signed)
CSW notified [facility] of patient's increased O2 needs. [place] reported that patient's current tank will support the increased need for O2. TOC signing off.

## 2021-08-31 NOTE — Discharge Summary (Signed)
Physician Discharge Summary  Gregory Alvarado URK:[id number] DOB: [birth year] DOA: 08/28/2021  PCP: Celene Squibb, MD  Admit date: 08/28/2021 Discharge date: 08/31/2021  Admitted From: Home Disposition:  Home   Recommendations for Outpatient Follow-up:  Follow up with PCP in 1-2 weeks Please obtain BMP/CBC in one week   Equipment/Devices:3L  Discharge Condition: Stable CODE STATUS:FULL Diet recommendation: Heart Healthy / Carb Modified    Brief/Interim Summary: 70 y.o. male with medical history of COPD, tobacco abuse, dCHF, GIB (gastric ulcers on EGD 10/2019) , HTN, HLD, DM2, poor compliance presenting with shortness of breath that started on 08/27/2021.  The patient has noted increasing lower extremity edema.  He denies worsening orthopnea or increasing abdominal girth.  He continues to smoke 1 pack/day.  He has a nearly 100-pack-year history of tobacco.  He denies any fevers, chills, headache, neck pain, chest pain, nausea, vomiting, diarrhea, abdominal pain, dysuria, hematuria.  There is not been any hematochezia or melena. He states that he has not been taking his Lasix every day.,  But states that he has been compliant with his other medications. Because of worsening shortness of breath, he activated EMS.  He was noted to have oxygen saturation of 77% on room air.  He was placed on CPAP, but was very somnolent at the time of arrival in the emergency department.  He was placed on BiPAP and started on IV Lasix and Solu-Medrol.  His mentation improved. ED The patient had low-grade temperature of 99.3 F but was hemodynamically stable.  Oxygen saturation was 96% on BiPAP FiO2 40.  BMP showed sodium 135, potassium 4.9, bicarb 36, serum creatinine 1.4.  LFTs were unremarkable.  WBC 14.5, hemoglobin 10.6, platelets 252,000.  BNP was 487.  Chest x-ray showed increased interstitial markings.  ABG showed 7.3 2/72/59 on 40%. He was started on IV lasix 40 mg bid and solumedrol with clinical  improvement.  Discharge Diagnoses:   Acute on chronic respiratory failure with hypoxia and hypercarbia -secondary to COPD exacerbation and CHF -Initially placed on BiPAP>>5L>>4L>>3L -Patient is chronically on 2 L at home -Wean oxygen back to 2 L as tolerated for saturation greater 90% -pt will go home with 3L [place]   COPD exacerbation -continue pulmicort -continue duonebs -continue IV solumedrol>>d/c home with prednisone taper   Acute on chronic diastolic CHF -Continue IV furosemide>>d/c home with lasix 40 mg daily po -Daily weights--NEG 8 lbs -Accurate I's and O's -12/19/2020 echo EF 60-65%, + WMA, mild MR -08/28/21 Echo--EF 55-60%,+WMA, G1DD -plan to transition to po lasix 12/18   Coronary artery disease -no Chest pain presently -EKG without concerning ischemic changes 09/12/20 cath: LM LIs, LAD prox 70% and 90% mid, LCX 80% prox with thrombus, OM2 85%, RCA small non dom 60%. LVEDP elevated but number not given,  - received DES to prox/mid LAD, DES to prox LCX, DES to OM2.  08/2020 echo: LVEF 35-40% grade II DDx, mild MR -continue ASA   Elevated troponins -due to demand ischemia -no chest pain   Obesity class II -BMI 39.95 -Lifestyle modification   DM2 -07/14/2021 A1c 6.4 -NovoLog sliding scale -increase semglee 15 units during hospitalization -novolog 6 units with meals during hospitalization   Essential hypertension -Continue metoprolol succinate -restart ACEi   Hyperlipidemia -continue statin   Tobacco abuse -cessation discussed        Discharge Instructions   Allergies as of 08/31/2021       Reactions   Caduet [amlodipine-atorvastatin] Swelling  Medication List     STOP taking these medications    hydrochlorothiazide 12.5 MG tablet Commonly known as: HYDRODIURIL       TAKE these medications    acetaminophen 325 MG tablet Commonly known as: TYLENOL Take 2 tablets (650 mg total) by mouth every 6 (six) hours as needed for mild  pain (or Fever >/= 101).   aspirin EC 81 MG tablet Take 81 mg by mouth daily. Swallow whole.   atorvastatin 40 MG tablet Commonly known as: LIPITOR Take 40 mg by mouth at bedtime.   cyanocobalamin 1000 MCG/ML injection Commonly known as: (VITAMIN B-12) Inject 1,000 mcg into the muscle every 30 (thirty) days.   Ferrex 150 150 MG capsule Generic drug: iron polysaccharides Take 150 mg by mouth daily.   folic acid 1 MG tablet Commonly known as: FOLVITE Take 1 tablet (1 mg total) by mouth daily. What changed: when to take this   furosemide 40 MG tablet Commonly known as: LASIX Take 1 tablet (40 mg total) by mouth daily. What changed: See the new instructions.   gabapentin 300 MG capsule Commonly known as: NEURONTIN Take 1 capsule by mouth at bedtime.   insulin NPH-regular Human (70-30) 100 UNIT/ML injection Inject 40 Units into the skin in the morning and at bedtime. Inject 40 units in morning and 40 units every evening   lisinopril 20 MG tablet Commonly known as: ZESTRIL Take 1 tablet (20 mg total) by mouth daily.   metoprolol succinate 100 MG 24 hr tablet Commonly known as: Toprol XL Take 1.5 tablets (150 mg total) by mouth daily. Take with or immediately following a meal. What changed: how much to take   nitroGLYCERIN 0.4 MG SL tablet Commonly known as: NITROSTAT DISSOLVE 1 TABLET SUBLINGUALLY AS NEEDED FOR CHEST PAIN, MAY REPEAT EVERY 5 MINUTES. AFTER 3 CALL 911.   pantoprazole 40 MG tablet Commonly known as: PROTONIX TAKE ONE TABLET BY MOUTH 2 TIMES A DAY BEFORE A MEAL What changed: See the new instructions.   predniSONE 10 MG tablet Commonly known as: DELTASONE Take 6 tablets (60 mg total) by mouth daily with breakfast. And decrease by one tablet daily Start taking on: September 01, 2021   spironolactone 25 MG tablet Commonly known as: ALDACTONE TAKE 1 TABLET BY MOUTH ONCE A DAY.   SUMAtriptan 25 MG tablet Commonly known as: IMITREX Take 25 mg by mouth  every 2 (two) hours as needed.   Symbicort 160-4.5 MCG/ACT inhaler Generic drug: budesonide-formoterol Inhale 2 puffs into the lungs 2 (two) times daily.   traMADol 50 MG tablet Commonly known as: ULTRAM Take 50-100 mg by mouth in the morning and at bedtime.   zolpidem 5 MG tablet Commonly known as: AMBIEN Take 5 mg by mouth at bedtime as needed.        Allergies  Allergen Reactions   Caduet [Amlodipine-Atorvastatin] Swelling    Consultations: none   Procedures/Studies: DG Chest Port 1 View  Result Date: 08/28/2021 CLINICAL DATA:  Shortness of breath. EXAM: PORTABLE CHEST 1 VIEW COMPARISON:  01/06/2021 FINDINGS: 0908 hours. The lungs are clear without focal pneumonia, edema, pneumothorax or pleural effusion. Interstitial markings are diffusely coarsened with chronic features. The cardio pericardial silhouette is enlarged. The visualized bony structures of the thorax show no acute abnormality. Telemetry leads overlie the chest. IMPRESSION: No acute cardiopulmonary findings. Chronic interstitial coarsening. Electronically Signed   By: Misty Stanley M.D.   On: 08/28/2021 09:31   ECHOCARDIOGRAM COMPLETE  Result Date: 08/28/2021  ECHOCARDIOGRAM REPORT   [name] Date of Exam: 08/28/2021 Medical Rec #:  [id number]     Height:       72.0 in Accession #:    [id number]    Weight:       285.0 lb Date of Birth:  04/19/1951     BSA:          2.477 m Patient Age:    23 years      BP:           93/48 mmHg Patient Gender: M             HR:           102 bpm. Exam Location:  [name] Procedure: 2D Echo, Cardiac Doppler, Color Doppler and Intracardiac            Opacification Agent Indications:    CHF  History:        Patient has prior history of Echocardiogram examinations, most                 recent 12/19/2020. CHF, Previous Myocardial Infarction and CAD,                 COPD; Risk Factors:Hypertension, Diabetes, Dyslipidemia and                 Current Smoker. Stents x3  in December 2021.  Sonographer:    Wenda Low Referring Phys: [phone] Tymara Saur  Sonographer Comments: Patient is morbidly obese. IMPRESSIONS  1. Left ventricular ejection fraction, by estimation, is 55 to 60%. The left ventricle has normal function. The left ventricle demonstrates regional wall motion abnormalities (see scoring diagram/findings for description). There is moderate concentric left ventricular hypertrophy. Left ventricular diastolic parameters are consistent with Grade I diastolic dysfunction (impaired relaxation). Elevated left ventricular end-diastolic pressure.  2. Right ventricular systolic function is normal. The right ventricular size is normal. There is normal pulmonary artery systolic pressure. The estimated right ventricular systolic pressure is 68.3 mmHg.  3. Left atrial size was mildly dilated.  4. A small pericardial effusion is present. The pericardial effusion is localized near the right atrium and posterior to the left ventricle.  5. The mitral valve is grossly normal. Mild mitral valve regurgitation.  6. The aortic valve is tricuspid. Aortic valve regurgitation is not visualized. Aortic valve sclerosis is present, with no evidence of aortic valve stenosis. Aortic valve mean gradient measures 5.0 mmHg.  7. The inferior vena cava is dilated in size with >50% respiratory variability, suggesting right atrial pressure of 8 mmHg. Comparison(s): Prior images reviewed side by side. LVEF preserved with wall motion abnormalities consistent with ischemic heart disease - previous study quality was not optimal, but these wall motion abnormalities were also present to some degree as well. FINDINGS  Left Ventricle: Left ventricular ejection fraction, by estimation, is 55 to 60%. The left ventricle has normal function. The left ventricle demonstrates regional wall motion abnormalities. Definity contrast agent was given IV to delineate the left ventricular endocardial borders. The left ventricular  internal cavity size was normal in size. There is moderate concentric left ventricular hypertrophy. Left ventricular diastolic parameters are consistent with Grade I diastolic dysfunction (impaired relaxation). Elevated left ventricular end-diastolic pressure.  LV Wall Scoring: The mid and distal anterior septum is akinetic. The apical lateral segment, apical anterior segment, apical inferior segment, and apex are hypokinetic. The anterior wall, antero-lateral wall, inferior wall, posterior wall, basal anteroseptal segment, mid  inferoseptal segment, and basal inferoseptal segment are normal. Right Ventricle: The right ventricular size is normal. No increase in right ventricular wall thickness. Right ventricular systolic function is normal. There is normal pulmonary artery systolic pressure. The tricuspid regurgitant velocity is 2.08 m/s, and  with an assumed right atrial pressure of 8 mmHg, the estimated right ventricular systolic pressure is 17.4 mmHg. Left Atrium: Left atrial size was mildly dilated. Right Atrium: Right atrial size was normal in size. Pericardium: A small pericardial effusion is present. The pericardial effusion is localized near the right atrium and posterior to the left ventricle. Presence of epicardial fat layer. Mitral Valve: The mitral valve is grossly normal. Mild mitral annular calcification. Mild mitral valve regurgitation. MV peak gradient, 10.4 mmHg. The mean mitral valve gradient is 5.0 mmHg. Tricuspid Valve: The tricuspid valve is grossly normal. Tricuspid valve regurgitation is trivial. Aortic Valve: The aortic valve is tricuspid. There is mild aortic valve annular calcification. Aortic valve regurgitation is not visualized. Aortic valve sclerosis is present, with no evidence of aortic valve stenosis. Aortic valve mean gradient measures  5.0 mmHg. Aortic valve peak gradient measures 9.1 mmHg. Aortic valve area, by VTI measures 3.13 cm. Pulmonic Valve: The pulmonic valve was grossly  normal. Pulmonic valve regurgitation is trivial. Aorta: The aortic root is normal in size and structure. Venous: The inferior vena cava is dilated in size with greater than 50% respiratory variability, suggesting right atrial pressure of 8 mmHg. IAS/Shunts: No atrial level shunt detected by color flow Doppler.  LEFT VENTRICLE PLAX 2D LVIDd:         5.30 cm   Diastology LVIDs:         3.60 cm   LV e' medial:    7.18 cm/s LV PW:         1.50 cm   LV E/e' medial:  17.0 LV IVS:        1.40 cm   LV e' lateral:   5.77 cm/s LVOT diam:     2.00 cm   LV E/e' lateral: 21.1 LV SV:         91 LV SV Index:   37 LVOT Area:     3.14 cm  RIGHT VENTRICLE RV Basal diam:  3.95 cm RV Mid diam:    3.40 cm RV S prime:     14.30 cm/s TAPSE (M-mode): 3.3 cm LEFT ATRIUM             Index        RIGHT ATRIUM           Index LA diam:        5.40 cm 2.18 cm/m   RA Area:     23.40 cm LA Vol (A2C):   90.3 ml 36.46 ml/m  RA Volume:   70.20 ml  28.34 ml/m LA Vol (A4C):   96.9 ml 39.13 ml/m LA Biplane Vol: 95.6 ml 38.60 ml/m  AORTIC VALVE                     PULMONIC VALVE AV Area (Vmax):    2.89 cm      PV Vmax:       1.06 m/s AV Area (Vmean):   3.20 cm      PV Peak grad:  4.5 mmHg AV Area (VTI):     3.13 cm AV Vmax:           151.00 cm/s AV Vmean:  99.300 cm/s AV VTI:            0.292 m AV Peak Grad:      9.1 mmHg AV Mean Grad:      5.0 mmHg LVOT Vmax:         139.00 cm/s LVOT Vmean:        101.000 cm/s LVOT VTI:          0.291 m LVOT/AV VTI ratio: 1.00  AORTA Ao Root diam: 3.40 cm Ao Asc diam:  3.20 cm MITRAL VALVE                TRICUSPID VALVE MV Area (PHT): 3.54 cm     TR Peak grad:   17.3 mmHg MV Area VTI:   2.10 cm     TR Vmax:        208.00 cm/s MV Peak grad:  10.4 mmHg MV Mean grad:  5.0 mmHg     SHUNTS MV Vmax:       1.61 m/s     Systemic VTI:  0.29 m MV Vmean:      105.0 cm/s   Systemic Diam: 2.00 cm MV Decel Time: 214 msec MV E velocity: 122.00 cm/s MV A velocity: 142.00 cm/s MV E/A ratio:  0.86 Rozann Lesches MD  Electronically signed by Rozann Lesches MD Signature Date/Time: 08/28/2021/4:47:16 PM    Final         Discharge Exam: Vitals:   08/31/21 0724 08/31/21 0729  BP:    Pulse:    Resp:    Temp:    SpO2: 96% 96%   Vitals:   08/31/21 0448 08/31/21 0529 08/31/21 0724 08/31/21 0729  BP: (!) 157/82     Pulse: 87     Resp: 20     Temp: 97.9 F (36.6 C)     TempSrc: Oral     SpO2: 98%  96% 96%  Weight:  130.1 kg    Height:        General: Pt is alert, awake, not in acute distress Cardiovascular: RRR, S1/S2 +, no rubs, no gallops Respiratory: fine bibasilar crackles. No wheeze Abdominal: Soft, NT, ND, bowel sounds + Extremities: no edema, no cyanosis   The results of significant diagnostics from this hospitalization (including imaging, microbiology, ancillary and laboratory) are listed below for reference.    Significant Diagnostic Studies: DG Chest Port 1 View  Result Date: 08/28/2021 CLINICAL DATA:  Shortness of breath. EXAM: PORTABLE CHEST 1 VIEW COMPARISON:  01/06/2021 FINDINGS: 0908 hours. The lungs are clear without focal pneumonia, edema, pneumothorax or pleural effusion. Interstitial markings are diffusely coarsened with chronic features. The cardio pericardial silhouette is enlarged. The visualized bony structures of the thorax show no acute abnormality. Telemetry leads overlie the chest. IMPRESSION: No acute cardiopulmonary findings. Chronic interstitial coarsening. Electronically Signed   By: Misty Stanley M.D.   On: 08/28/2021 09:31   ECHOCARDIOGRAM COMPLETE  Result Date: 08/28/2021    ECHOCARDIOGRAM REPORT   [name] Date of Exam: 08/28/2021 Medical Rec #:  [id number]     Height:       72.0 in Accession #:    [id number]    Weight:       285.0 lb Date of Birth:  11-26-1950     BSA:          2.477 m Patient Age:    69 years      BP:  93/48 mmHg Patient Gender: M             HR:           102 bpm. Exam Location:  [name] Procedure: 2D Echo,  Cardiac Doppler, Color Doppler and Intracardiac            Opacification Agent Indications:    CHF  History:        Patient has prior history of Echocardiogram examinations, most                 recent 12/19/2020. CHF, Previous Myocardial Infarction and CAD,                 COPD; Risk Factors:Hypertension, Diabetes, Dyslipidemia and                 Current Smoker. Stents x3 in December 2021.  Sonographer:    Wenda Low Referring Phys: [phone] Rocket Gunderson  Sonographer Comments: Patient is morbidly obese. IMPRESSIONS  1. Left ventricular ejection fraction, by estimation, is 55 to 60%. The left ventricle has normal function. The left ventricle demonstrates regional wall motion abnormalities (see scoring diagram/findings for description). There is moderate concentric left ventricular hypertrophy. Left ventricular diastolic parameters are consistent with Grade I diastolic dysfunction (impaired relaxation). Elevated left ventricular end-diastolic pressure.  2. Right ventricular systolic function is normal. The right ventricular size is normal. There is normal pulmonary artery systolic pressure. The estimated right ventricular systolic pressure is 37.8 mmHg.  3. Left atrial size was mildly dilated.  4. A small pericardial effusion is present. The pericardial effusion is localized near the right atrium and posterior to the left ventricle.  5. The mitral valve is grossly normal. Mild mitral valve regurgitation.  6. The aortic valve is tricuspid. Aortic valve regurgitation is not visualized. Aortic valve sclerosis is present, with no evidence of aortic valve stenosis. Aortic valve mean gradient measures 5.0 mmHg.  7. The inferior vena cava is dilated in size with >50% respiratory variability, suggesting right atrial pressure of 8 mmHg. Comparison(s): Prior images reviewed side by side. LVEF preserved with wall motion abnormalities consistent with ischemic heart disease - previous study quality was not optimal, but these wall  motion abnormalities were also present to some degree as well. FINDINGS  Left Ventricle: Left ventricular ejection fraction, by estimation, is 55 to 60%. The left ventricle has normal function. The left ventricle demonstrates regional wall motion abnormalities. Definity contrast agent was given IV to delineate the left ventricular endocardial borders. The left ventricular internal cavity size was normal in size. There is moderate concentric left ventricular hypertrophy. Left ventricular diastolic parameters are consistent with Grade I diastolic dysfunction (impaired relaxation). Elevated left ventricular end-diastolic pressure.  LV Wall Scoring: The mid and distal anterior septum is akinetic. The apical lateral segment, apical anterior segment, apical inferior segment, and apex are hypokinetic. The anterior wall, antero-lateral wall, inferior wall, posterior wall, basal anteroseptal segment, mid inferoseptal segment, and basal inferoseptal segment are normal. Right Ventricle: The right ventricular size is normal. No increase in right ventricular wall thickness. Right ventricular systolic function is normal. There is normal pulmonary artery systolic pressure. The tricuspid regurgitant velocity is 2.08 m/s, and  with an assumed right atrial pressure of 8 mmHg, the estimated right ventricular systolic pressure is 58.8 mmHg. Left Atrium: Left atrial size was mildly dilated. Right Atrium: Right atrial size was normal in size. Pericardium: A small pericardial effusion is present. The pericardial effusion is localized near the right  atrium and posterior to the left ventricle. Presence of epicardial fat layer. Mitral Valve: The mitral valve is grossly normal. Mild mitral annular calcification. Mild mitral valve regurgitation. MV peak gradient, 10.4 mmHg. The mean mitral valve gradient is 5.0 mmHg. Tricuspid Valve: The tricuspid valve is grossly normal. Tricuspid valve regurgitation is trivial. Aortic Valve: The aortic valve  is tricuspid. There is mild aortic valve annular calcification. Aortic valve regurgitation is not visualized. Aortic valve sclerosis is present, with no evidence of aortic valve stenosis. Aortic valve mean gradient measures  5.0 mmHg. Aortic valve peak gradient measures 9.1 mmHg. Aortic valve area, by VTI measures 3.13 cm. Pulmonic Valve: The pulmonic valve was grossly normal. Pulmonic valve regurgitation is trivial. Aorta: The aortic root is normal in size and structure. Venous: The inferior vena cava is dilated in size with greater than 50% respiratory variability, suggesting right atrial pressure of 8 mmHg. IAS/Shunts: No atrial level shunt detected by color flow Doppler.  LEFT VENTRICLE PLAX 2D LVIDd:         5.30 cm   Diastology LVIDs:         3.60 cm   LV e' medial:    7.18 cm/s LV PW:         1.50 cm   LV E/e' medial:  17.0 LV IVS:        1.40 cm   LV e' lateral:   5.77 cm/s LVOT diam:     2.00 cm   LV E/e' lateral: 21.1 LV SV:         91 LV SV Index:   37 LVOT Area:     3.14 cm  RIGHT VENTRICLE RV Basal diam:  3.95 cm RV Mid diam:    3.40 cm RV S prime:     14.30 cm/s TAPSE (M-mode): 3.3 cm LEFT ATRIUM             Index        RIGHT ATRIUM           Index LA diam:        5.40 cm 2.18 cm/m   RA Area:     23.40 cm LA Vol (A2C):   90.3 ml 36.46 ml/m  RA Volume:   70.20 ml  28.34 ml/m LA Vol (A4C):   96.9 ml 39.13 ml/m LA Biplane Vol: 95.6 ml 38.60 ml/m  AORTIC VALVE                     PULMONIC VALVE AV Area (Vmax):    2.89 cm      PV Vmax:       1.06 m/s AV Area (Vmean):   3.20 cm      PV Peak grad:  4.5 mmHg AV Area (VTI):     3.13 cm AV Vmax:           151.00 cm/s AV Vmean:          99.300 cm/s AV VTI:            0.292 m AV Peak Grad:      9.1 mmHg AV Mean Grad:      5.0 mmHg LVOT Vmax:         139.00 cm/s LVOT Vmean:        101.000 cm/s LVOT VTI:          0.291 m LVOT/AV VTI ratio: 1.00  AORTA Ao Root diam: 3.40 cm Ao Asc diam:  3.20 cm MITRAL VALVE  TRICUSPID VALVE MV Area (PHT):  3.54 cm     TR Peak grad:   17.3 mmHg MV Area VTI:   2.10 cm     TR Vmax:        208.00 cm/s MV Peak grad:  10.4 mmHg MV Mean grad:  5.0 mmHg     SHUNTS MV Vmax:       1.61 m/s     Systemic VTI:  0.29 m MV Vmean:      105.0 cm/s   Systemic Diam: 2.00 cm MV Decel Time: 214 msec MV E velocity: 122.00 cm/s MV A velocity: 142.00 cm/s MV E/A ratio:  0.86 Rozann Lesches MD Electronically signed by Rozann Lesches MD Signature Date/Time: 08/28/2021/4:47:16 PM    Final     Microbiology: Recent Results (from the past 240 hour(s))  Resp Panel by RT-PCR (Flu A&B, Covid) Nasopharyngeal Swab     Status: None   Collection Time: 08/28/21  9:30 AM   Specimen: Nasopharyngeal Swab; Nasopharyngeal(NP) swabs in vial transport medium  Result Value Ref Range Status   SARS Coronavirus 2 by RT PCR NEGATIVE NEGATIVE Final    Comment: (NOTE) SARS-CoV-2 target nucleic acids are NOT DETECTED.  The SARS-CoV-2 RNA is generally detectable in upper respiratory specimens during the acute phase of infection. The lowest concentration of SARS-CoV-2 viral copies this assay can detect is 138 copies/mL. A negative result does not preclude SARS-Cov-2 infection and should not be used as the sole basis for treatment or other patient management decisions. A negative result may occur with  improper specimen collection/handling, submission of specimen other than nasopharyngeal swab, presence of viral mutation(s) within the areas targeted by this assay, and inadequate number of viral copies(<138 copies/mL). A negative result must be combined with clinical observations, patient history, and epidemiological information. The expected result is Negative.  Fact Sheet for Patients:  EntrepreneurPulse.com.au  Fact Sheet for Healthcare Providers:  IncredibleEmployment.be  This test is no t yet approved or cleared by the [place] FDA and  has been authorized for detection and/or diagnosis of  SARS-CoV-2 by FDA under an Emergency Use Authorization (EUA). This EUA will remain  in effect (meaning this test can be used) for the duration of the COVID-19 declaration under Section 564(b)(1) of the Act, 21 U.S.C.section 360bbb-3(b)(1), unless the authorization is terminated  or revoked sooner.       Influenza A by PCR NEGATIVE NEGATIVE Final   Influenza B by PCR NEGATIVE NEGATIVE Final    Comment: (NOTE) The Xpert Xpress SARS-CoV-2/FLU/RSV plus assay is intended as an aid in the diagnosis of influenza from Nasopharyngeal swab specimens and should not be used as a sole basis for treatment. Nasal washings and aspirates are unacceptable for Xpert Xpress SARS-CoV-2/FLU/RSV testing.  Fact Sheet for Patients: EntrepreneurPulse.com.au  Fact Sheet for Healthcare Providers: IncredibleEmployment.be  This test is not yet approved or cleared by the [place] FDA and has been authorized for detection and/or diagnosis of SARS-CoV-2 by FDA under an Emergency Use Authorization (EUA). This EUA will remain in effect (meaning this test can be used) for the duration of the COVID-19 declaration under Section 564(b)(1) of the Act, 21 U.S.C. section 360bbb-3(b)(1), unless the authorization is terminated or revoked.  Performed at [facility], [address] [place], [place] [address]   Urine Culture     Status: None   Collection Time: 08/28/21 10:25 AM   Specimen: Urine, Catheterized  Result Value Ref Range Status   Specimen Description   Final  URINE, CATHETERIZED Performed at [facility], [address], [place], [place] [address]    Special Requests   Final    NONE Performed at [facility], [address], [place], [place] [address]    Culture   Final    NO GROWTH Performed at [facility], [place] [address], [place], [place] [address]    Report Status 08/30/2021 FINAL  Final  Blood Culture (routine x 2)     Status: None (Preliminary  result)   Collection Time: 08/28/21 10:52 AM   Specimen: BLOOD  Result Value Ref Range Status   Specimen Description BLOOD BLOOD LEFT HAND  Final   Special Requests   Final    BOTTLES DRAWN AEROBIC AND ANAEROBIC Blood Culture adequate volume   Culture   Final    NO GROWTH 2 DAYS Performed at [facility], [address], [place], [place] [address]    Report Status PENDING  Incomplete  Blood Culture (routine x 2)     Status: None (Preliminary result)   Collection Time: 08/28/21 10:52 AM   Specimen: BLOOD  Result Value Ref Range Status   Specimen Description BLOOD BLOOD LEFT FOREARM  Final   Special Requests   Final    BOTTLES DRAWN AEROBIC AND ANAEROBIC Blood Culture adequate volume   Culture   Final    NO GROWTH 2 DAYS Performed at [facility], [address] [place], [place] [address]    Report Status PENDING  Incomplete     Labs: Basic Metabolic Panel: Recent Labs  Lab 08/28/21 0859 08/29/21 0533 08/30/21 0435 08/31/21 0438  NA 135 133* 134* 135  K 4.9 4.0 3.9 3.5  CL 95* 89* 90* 90*  CO2 36* 33* 32 36*  GLUCOSE 142* 309* 306* 211*  BUN 17 33* 36* 41*  CREATININE 1.14 1.18 1.05 1.08  CALCIUM 8.9 9.0 9.2 9.3  MG  --   --  2.1 2.3   Liver Function Tests: Recent Labs  Lab 08/28/21 0859  AST 13*  ALT 12  ALKPHOS 76  BILITOT 0.6  PROT 7.7  ALBUMIN 3.6   No results for input(s): LIPASE, AMYLASE in the last 168 hours. No results for input(s): AMMONIA in the last 168 hours. CBC: Recent Labs  Lab 08/28/21 0859 08/29/21 0533  WBC 14.5* 18.2*  NEUTROABS 12.3*  --   HGB 10.6* 9.7*  HCT 39.5 34.1*  MCV 87.2 84.4  PLT 252 247   Cardiac Enzymes: No results for input(s): CKTOTAL, CKMB, CKMBINDEX, TROPONINI in the last 168 hours. BNP: Invalid input(s): POCBNP CBG: Recent Labs  Lab 08/30/21 0701 08/30/21 1147 08/30/21 1636 08/30/21 2120 08/31/21 0706  GLUCAP 308* 305* 277* 244* 211*    Time coordinating discharge:  36 minutes  Signed:  Orson Eva, DO [facility] Pager: [phone] 08/31/2021, 9:26 AM

## 2021-09-03 LAB — CULTURE, BLOOD (ROUTINE X 2)
Culture: NO GROWTH
Culture: NO GROWTH
Special Requests: ADEQUATE
Special Requests: ADEQUATE

## 2021-09-05 DIAGNOSIS — J449 Chronic obstructive pulmonary disease, unspecified: Secondary | ICD-10-CM | POA: Diagnosis not present

## 2021-10-01 DIAGNOSIS — H7202 Central perforation of tympanic membrane, left ear: Secondary | ICD-10-CM | POA: Diagnosis not present

## 2021-10-06 DIAGNOSIS — J449 Chronic obstructive pulmonary disease, unspecified: Secondary | ICD-10-CM | POA: Diagnosis not present

## 2021-10-23 DIAGNOSIS — D509 Iron deficiency anemia, unspecified: Secondary | ICD-10-CM | POA: Diagnosis not present

## 2021-10-23 DIAGNOSIS — E1169 Type 2 diabetes mellitus with other specified complication: Secondary | ICD-10-CM | POA: Diagnosis not present

## 2021-10-23 DIAGNOSIS — I10 Essential (primary) hypertension: Secondary | ICD-10-CM | POA: Diagnosis not present

## 2021-10-24 DIAGNOSIS — I2510 Atherosclerotic heart disease of native coronary artery without angina pectoris: Secondary | ICD-10-CM | POA: Diagnosis not present

## 2021-10-24 DIAGNOSIS — E1169 Type 2 diabetes mellitus with other specified complication: Secondary | ICD-10-CM | POA: Diagnosis not present

## 2021-10-24 DIAGNOSIS — E782 Mixed hyperlipidemia: Secondary | ICD-10-CM | POA: Diagnosis not present

## 2021-10-24 DIAGNOSIS — M79605 Pain in left leg: Secondary | ICD-10-CM | POA: Diagnosis not present

## 2021-10-24 DIAGNOSIS — D509 Iron deficiency anemia, unspecified: Secondary | ICD-10-CM | POA: Diagnosis not present

## 2021-10-24 DIAGNOSIS — F17200 Nicotine dependence, unspecified, uncomplicated: Secondary | ICD-10-CM | POA: Diagnosis not present

## 2021-10-24 DIAGNOSIS — J449 Chronic obstructive pulmonary disease, unspecified: Secondary | ICD-10-CM | POA: Diagnosis not present

## 2021-10-24 DIAGNOSIS — I10 Essential (primary) hypertension: Secondary | ICD-10-CM | POA: Diagnosis not present

## 2021-10-24 DIAGNOSIS — I5022 Chronic systolic (congestive) heart failure: Secondary | ICD-10-CM | POA: Diagnosis not present

## 2021-10-24 DIAGNOSIS — D72829 Elevated white blood cell count, unspecified: Secondary | ICD-10-CM | POA: Diagnosis not present

## 2021-10-24 DIAGNOSIS — M545 Low back pain, unspecified: Secondary | ICD-10-CM | POA: Diagnosis not present

## 2021-10-24 DIAGNOSIS — M79604 Pain in right leg: Secondary | ICD-10-CM | POA: Diagnosis not present

## 2021-10-29 ENCOUNTER — Other Ambulatory Visit: Payer: Self-pay | Admitting: Cardiology

## 2021-11-06 DIAGNOSIS — J449 Chronic obstructive pulmonary disease, unspecified: Secondary | ICD-10-CM | POA: Diagnosis not present

## 2021-11-18 ENCOUNTER — Encounter: Payer: Self-pay | Admitting: Internal Medicine

## 2021-11-18 ENCOUNTER — Other Ambulatory Visit: Payer: Self-pay | Admitting: Cardiology

## 2021-11-18 ENCOUNTER — Other Ambulatory Visit: Payer: Self-pay | Admitting: Gastroenterology

## 2021-11-18 NOTE — Telephone Encounter (Signed)
Limited refills. Patient needs follow up ov. ?

## 2021-11-18 NOTE — Telephone Encounter (Signed)
Last office visit 01/22/21 ?

## 2021-11-21 DIAGNOSIS — M79604 Pain in right leg: Secondary | ICD-10-CM | POA: Diagnosis not present

## 2021-11-21 DIAGNOSIS — M545 Low back pain, unspecified: Secondary | ICD-10-CM | POA: Diagnosis not present

## 2021-11-21 DIAGNOSIS — D509 Iron deficiency anemia, unspecified: Secondary | ICD-10-CM | POA: Diagnosis not present

## 2021-11-21 DIAGNOSIS — I2510 Atherosclerotic heart disease of native coronary artery without angina pectoris: Secondary | ICD-10-CM | POA: Diagnosis not present

## 2021-11-21 DIAGNOSIS — I10 Essential (primary) hypertension: Secondary | ICD-10-CM | POA: Diagnosis not present

## 2021-11-21 DIAGNOSIS — D72829 Elevated white blood cell count, unspecified: Secondary | ICD-10-CM | POA: Diagnosis not present

## 2021-11-21 DIAGNOSIS — I5022 Chronic systolic (congestive) heart failure: Secondary | ICD-10-CM | POA: Diagnosis not present

## 2021-11-21 DIAGNOSIS — J449 Chronic obstructive pulmonary disease, unspecified: Secondary | ICD-10-CM | POA: Diagnosis not present

## 2021-11-21 DIAGNOSIS — F17200 Nicotine dependence, unspecified, uncomplicated: Secondary | ICD-10-CM | POA: Diagnosis not present

## 2021-11-21 DIAGNOSIS — M25512 Pain in left shoulder: Secondary | ICD-10-CM | POA: Diagnosis not present

## 2021-11-21 DIAGNOSIS — J9611 Chronic respiratory failure with hypoxia: Secondary | ICD-10-CM | POA: Diagnosis not present

## 2021-11-21 DIAGNOSIS — M79605 Pain in left leg: Secondary | ICD-10-CM | POA: Diagnosis not present

## 2021-11-24 ENCOUNTER — Inpatient Hospital Stay (HOSPITAL_COMMUNITY)
Admission: EM | Admit: 2021-11-24 | Discharge: 2021-11-27 | DRG: 377 | Disposition: A | Discharge status: Home / Self Care | Payer: Medicare Other | Source: Ambulatory Visit | Attending: Internal Medicine | Admitting: Internal Medicine

## 2021-11-24 ENCOUNTER — Emergency Department (HOSPITAL_COMMUNITY): Payer: Medicare Other

## 2021-11-24 ENCOUNTER — Encounter (HOSPITAL_COMMUNITY): Payer: Self-pay | Admitting: *Deleted

## 2021-11-24 ENCOUNTER — Other Ambulatory Visit: Payer: Self-pay

## 2021-11-24 DIAGNOSIS — K31811 Angiodysplasia of stomach and duodenum with bleeding: Principal | ICD-10-CM | POA: Diagnosis present

## 2021-11-24 DIAGNOSIS — Z955 Presence of coronary angioplasty implant and graft: Secondary | ICD-10-CM | POA: Diagnosis not present

## 2021-11-24 DIAGNOSIS — Z7951 Long term (current) use of inhaled steroids: Secondary | ICD-10-CM

## 2021-11-24 DIAGNOSIS — D62 Acute posthemorrhagic anemia: Secondary | ICD-10-CM | POA: Diagnosis not present

## 2021-11-24 DIAGNOSIS — Z91199 Patient's noncompliance with other medical treatment and regimen due to unspecified reason: Secondary | ICD-10-CM | POA: Diagnosis not present

## 2021-11-24 DIAGNOSIS — Z794 Long term (current) use of insulin: Secondary | ICD-10-CM

## 2021-11-24 DIAGNOSIS — D649 Anemia, unspecified: Secondary | ICD-10-CM | POA: Diagnosis not present

## 2021-11-24 DIAGNOSIS — E782 Mixed hyperlipidemia: Secondary | ICD-10-CM | POA: Diagnosis not present

## 2021-11-24 DIAGNOSIS — Z20822 Contact with and (suspected) exposure to covid-19: Secondary | ICD-10-CM | POA: Diagnosis not present

## 2021-11-24 DIAGNOSIS — Z6838 Body mass index (BMI) 38.0-38.9, adult: Secondary | ICD-10-CM

## 2021-11-24 DIAGNOSIS — D509 Iron deficiency anemia, unspecified: Secondary | ICD-10-CM | POA: Diagnosis present

## 2021-11-24 DIAGNOSIS — E669 Obesity, unspecified: Secondary | ICD-10-CM | POA: Diagnosis present

## 2021-11-24 DIAGNOSIS — K5710 Diverticulosis of small intestine without perforation or abscess without bleeding: Secondary | ICD-10-CM | POA: Diagnosis present

## 2021-11-24 DIAGNOSIS — J9622 Acute and chronic respiratory failure with hypercapnia: Secondary | ICD-10-CM | POA: Diagnosis present

## 2021-11-24 DIAGNOSIS — I5032 Chronic diastolic (congestive) heart failure: Secondary | ICD-10-CM | POA: Diagnosis not present

## 2021-11-24 DIAGNOSIS — R7989 Other specified abnormal findings of blood chemistry: Secondary | ICD-10-CM

## 2021-11-24 DIAGNOSIS — E871 Hypo-osmolality and hyponatremia: Secondary | ICD-10-CM | POA: Diagnosis present

## 2021-11-24 DIAGNOSIS — I2510 Atherosclerotic heart disease of native coronary artery without angina pectoris: Secondary | ICD-10-CM | POA: Diagnosis not present

## 2021-11-24 DIAGNOSIS — R739 Hyperglycemia, unspecified: Secondary | ICD-10-CM | POA: Diagnosis not present

## 2021-11-24 DIAGNOSIS — Z79899 Other long term (current) drug therapy: Secondary | ICD-10-CM

## 2021-11-24 DIAGNOSIS — J449 Chronic obstructive pulmonary disease, unspecified: Secondary | ICD-10-CM | POA: Diagnosis not present

## 2021-11-24 DIAGNOSIS — Z8619 Personal history of other infectious and parasitic diseases: Secondary | ICD-10-CM | POA: Diagnosis not present

## 2021-11-24 DIAGNOSIS — I252 Old myocardial infarction: Secondary | ICD-10-CM

## 2021-11-24 DIAGNOSIS — E66812 Obesity, class 2: Secondary | ICD-10-CM

## 2021-11-24 DIAGNOSIS — E1165 Type 2 diabetes mellitus with hyperglycemia: Secondary | ICD-10-CM | POA: Diagnosis not present

## 2021-11-24 DIAGNOSIS — E785 Hyperlipidemia, unspecified: Secondary | ICD-10-CM | POA: Diagnosis not present

## 2021-11-24 DIAGNOSIS — J9620 Acute and chronic respiratory failure, unspecified whether with hypoxia or hypercapnia: Secondary | ICD-10-CM

## 2021-11-24 DIAGNOSIS — J441 Chronic obstructive pulmonary disease with (acute) exacerbation: Secondary | ICD-10-CM | POA: Diagnosis not present

## 2021-11-24 DIAGNOSIS — Z9981 Dependence on supplemental oxygen: Secondary | ICD-10-CM

## 2021-11-24 DIAGNOSIS — K5520 Angiodysplasia of colon without hemorrhage: Secondary | ICD-10-CM | POA: Diagnosis not present

## 2021-11-24 DIAGNOSIS — I5042 Chronic combined systolic (congestive) and diastolic (congestive) heart failure: Secondary | ICD-10-CM | POA: Diagnosis present

## 2021-11-24 DIAGNOSIS — Z8711 Personal history of peptic ulcer disease: Secondary | ICD-10-CM | POA: Diagnosis not present

## 2021-11-24 DIAGNOSIS — K922 Gastrointestinal hemorrhage, unspecified: Secondary | ICD-10-CM | POA: Diagnosis not present

## 2021-11-24 DIAGNOSIS — I110 Hypertensive heart disease with heart failure: Secondary | ICD-10-CM | POA: Diagnosis not present

## 2021-11-24 DIAGNOSIS — Z7982 Long term (current) use of aspirin: Secondary | ICD-10-CM

## 2021-11-24 DIAGNOSIS — Z833 Family history of diabetes mellitus: Secondary | ICD-10-CM

## 2021-11-24 DIAGNOSIS — J9621 Acute and chronic respiratory failure with hypoxia: Secondary | ICD-10-CM | POA: Diagnosis not present

## 2021-11-24 DIAGNOSIS — K5521 Angiodysplasia of colon with hemorrhage: Secondary | ICD-10-CM | POA: Diagnosis not present

## 2021-11-24 DIAGNOSIS — E6609 Other obesity due to excess calories: Secondary | ICD-10-CM

## 2021-11-24 DIAGNOSIS — I10 Essential (primary) hypertension: Secondary | ICD-10-CM | POA: Diagnosis present

## 2021-11-24 DIAGNOSIS — R0602 Shortness of breath: Secondary | ICD-10-CM | POA: Diagnosis not present

## 2021-11-24 DIAGNOSIS — F17210 Nicotine dependence, cigarettes, uncomplicated: Secondary | ICD-10-CM | POA: Diagnosis not present

## 2021-11-24 DIAGNOSIS — I517 Cardiomegaly: Secondary | ICD-10-CM | POA: Diagnosis not present

## 2021-11-24 DIAGNOSIS — K558 Other vascular disorders of intestine: Secondary | ICD-10-CM | POA: Diagnosis not present

## 2021-11-24 DIAGNOSIS — K5711 Diverticulosis of small intestine without perforation or abscess with bleeding: Secondary | ICD-10-CM | POA: Diagnosis not present

## 2021-11-24 DIAGNOSIS — Z8249 Family history of ischemic heart disease and other diseases of the circulatory system: Secondary | ICD-10-CM

## 2021-11-24 DIAGNOSIS — K219 Gastro-esophageal reflux disease without esophagitis: Secondary | ICD-10-CM | POA: Diagnosis present

## 2021-11-24 LAB — BRAIN NATRIURETIC PEPTIDE: B Natriuretic Peptide: 433 pg/mL — ABNORMAL HIGH (ref 0.0–100.0)

## 2021-11-24 LAB — BASIC METABOLIC PANEL
Anion gap: 8 (ref 5–15)
BUN: 20 mg/dL (ref 8–23)
CO2: 32 mmol/L (ref 22–32)
Calcium: 8.9 mg/dL (ref 8.9–10.3)
Chloride: 93 mmol/L — ABNORMAL LOW (ref 98–111)
Creatinine, Ser: 1.24 mg/dL (ref 0.61–1.24)
GFR, Estimated: >60 mL/min (ref >60)
Glucose, Bld: 188 mg/dL — ABNORMAL HIGH (ref 70–99)
Potassium: 4.6 mmol/L (ref 3.5–5.1)
Sodium: 133 mmol/L — ABNORMAL LOW (ref 135–145)

## 2021-11-24 LAB — BLOOD GAS, VENOUS
Acid-Base Excess: 12.8 mmol/L — ABNORMAL HIGH (ref 0.0–2.0)
Bicarbonate: 39.5 mmol/L — ABNORMAL HIGH (ref 20.0–28.0)
Drawn by: 44828
FIO2: 28 %
O2 Saturation: 38.7 %
Patient temperature: 36.8
pCO2, Ven: 69 mmHg — ABNORMAL HIGH (ref 44–60)
pH, Ven: 7.36 (ref 7.25–7.43)
pO2, Ven: <31 mmHg — CL (ref 32–45)

## 2021-11-24 LAB — PROTIME-INR
INR: 1.1 (ref 0.8–1.2)
Prothrombin Time: 13.8 seconds (ref 11.4–15.2)

## 2021-11-24 LAB — GLUCOSE, CAPILLARY: Glucose-Capillary: 168 mg/dL — ABNORMAL HIGH (ref 70–99)

## 2021-11-24 LAB — RESP PANEL BY RT-PCR (FLU A&B, COVID) ARPGX2
Influenza A by PCR: NEGATIVE
Influenza B by PCR: NEGATIVE
SARS Coronavirus 2 by RT PCR: NEGATIVE

## 2021-11-24 LAB — CBC
HCT: 25.3 % — ABNORMAL LOW (ref 39.0–52.0)
Hemoglobin: 6.4 g/dL — CL (ref 13.0–17.0)
MCH: 20.4 pg — ABNORMAL LOW (ref 26.0–34.0)
MCHC: 25.3 g/dL — ABNORMAL LOW (ref 30.0–36.0)
MCV: 80.6 fL (ref 80.0–100.0)
Platelets: 265 10*3/uL (ref 150–400)
RBC: 3.14 MIL/uL — ABNORMAL LOW (ref 4.22–5.81)
RDW: 18 % — ABNORMAL HIGH (ref 11.5–15.5)
WBC: 10.1 10*3/uL (ref 4.0–10.5)
nRBC: 0.2 % (ref 0.0–0.2)

## 2021-11-24 LAB — POC OCCULT BLOOD, ED: Fecal Occult Bld: POSITIVE — AB

## 2021-11-24 LAB — PREPARE RBC (CROSSMATCH)

## 2021-11-24 MED ORDER — PANTOPRAZOLE INFUSION (NEW) - SIMPLE MED
8.0000 mg/h | INTRAVENOUS | Status: DC
Start: 2021-11-25 — End: 2021-11-25
  Administered 2021-11-25 (×2): 8 mg/h via INTRAVENOUS
  Filled 2021-11-24 (×2): qty 100
  Filled 2021-11-24: qty 80
  Filled 2021-11-24: qty 100

## 2021-11-24 MED ORDER — SODIUM CHLORIDE 0.9 % IV SOLN
10.0000 mL/h | Freq: Once | INTRAVENOUS | Status: AC
  Administered 2021-11-24: 10 mL/h via INTRAVENOUS

## 2021-11-24 MED ORDER — PANTOPRAZOLE SODIUM 40 MG IV SOLR: 40.0000 mg | Freq: Two times a day (BID) | INTRAVENOUS | Status: DC

## 2021-11-24 MED ORDER — PANTOPRAZOLE SODIUM 40 MG IV SOLR
40.0000 mg | Freq: Once | INTRAVENOUS | Status: AC
  Administered 2021-11-25: 40 mg via INTRAVENOUS
  Filled 2021-11-24: qty 10

## 2021-11-24 MED ORDER — IPRATROPIUM-ALBUTEROL 0.5-2.5 (3) MG/3ML IN SOLN
3.0000 mL | Freq: Once | RESPIRATORY_TRACT | Status: AC
  Administered 2021-11-24: 3 mL via RESPIRATORY_TRACT
  Filled 2021-11-24: qty 3

## 2021-11-24 MED ORDER — METHYLPREDNISOLONE SODIUM SUCC 125 MG IJ SOLR
125.0000 mg | Freq: Once | INTRAMUSCULAR | Status: AC
  Administered 2021-11-24: 125 mg via INTRAVENOUS
  Filled 2021-11-24: qty 2

## 2021-11-24 MED ORDER — MAGNESIUM SULFATE 2 GM/50ML IV SOLN
2.0000 g | Freq: Once | INTRAVENOUS | Status: AC
  Administered 2021-11-24: 2 g via INTRAVENOUS
  Filled 2021-11-24: qty 50

## 2021-11-24 MED ORDER — PANTOPRAZOLE SODIUM 40 MG IV SOLR
40.0000 mg | Freq: Once | INTRAVENOUS | Status: AC
  Administered 2021-11-24: 40 mg via INTRAVENOUS
  Filled 2021-11-24: qty 10

## 2021-11-24 MED ORDER — PANTOPRAZOLE 80MG IVPB - SIMPLE MED
80.0000 mg | Freq: Two times a day (BID) | INTRAVENOUS | Status: DC
Start: 2021-11-25 — End: 2021-11-24
  Filled 2021-11-24: qty 100

## 2021-11-24 NOTE — ED Notes (Signed)
Attempted report x1. 

## 2021-11-24 NOTE — Assessment & Plan Note (Signed)
-  Continue Lipitor °

## 2021-11-24 NOTE — ED Notes (Signed)
Date and time results received: 11/24/21 1850 ? ? ?Test: hemoglobin ?Critical Value: 6.4 ? ?Name of Provider Notified: Dr. Melina Copa ? ?Orders Received? Or Actions Taken?: notified ?

## 2021-11-24 NOTE — Assessment & Plan Note (Signed)
Continue ISS and hypoglycemic protocol ?

## 2021-11-24 NOTE — ED Provider Notes (Incomplete)
[facility] EMERGENCY DEPARTMENT Provider Note   CSN: [id number] Arrival date & time: 11/24/21  1737     History {Add pertinent medical, surgical, social history, OB history to HPI:1} Chief Complaint  Patient presents with   Shortness of Breath    Gregory Alvarado is a 71 y.o. male.  He has a history of COPD and is usually on 4 L of oxygen, states he smokes like a train.  He was sent in by his primary care doctor for low hemoglobin.  He denies any obvious bleeding although does say his stools have been black.  He has been more fatigued and difficult to wake in the mornings for the last few days.  He is not sure if his shortness of breath is any worse.  Productive cough with white sputum.  No hemoptysis.  No abdominal pain vomiting diarrhea.  The history is provided by the patient.  Shortness of Breath Severity:  Moderate Onset quality:  Gradual Timing:  Intermittent Progression:  Unchanged Chronicity:  Chronic Context: activity   Relieved by:  Nothing Worsened by:  Activity and coughing Ineffective treatments:  Oxygen, rest and position changes Associated symptoms: cough, sputum production and wheezing   Associated symptoms: no abdominal pain, no chest pain, no fever, no hemoptysis and no vomiting   Risk factors: tobacco use       Home Medications Prior to Admission medications   Medication Sig Start Date End Date Taking? Authorizing Provider  acetaminophen (TYLENOL) 325 MG tablet Take 2 tablets (650 mg total) by mouth every 6 (six) hours as needed for mild pain (or Fever >/= 101). 10/29/19   Roxan Hockey, MD  aspirin EC 81 MG tablet Take 81 mg by mouth daily. Swallow whole.    [provider]  atorvastatin (LIPITOR) 40 MG tablet Take 40 mg by mouth at bedtime. 08/30/19   [provider]  cyanocobalamin (,VITAMIN B-12,) 1000 MCG/ML injection Inject 1,000 mcg into the muscle every 30 (thirty) days.     [provider]  FERREX 150 150 MG capsule Take  150 mg by mouth daily. 12/04/20   [provider]  folic acid (FOLVITE) 1 MG tablet Take 1 tablet (1 mg total) by mouth daily. Patient taking differently: Take 1 mg by mouth at bedtime. 10/30/19   Roxan Hockey, MD  furosemide (LASIX) 40 MG tablet Take 1 tablet (40 mg total) by mouth daily. 08/31/21   Orson Eva, MD  gabapentin (NEURONTIN) 300 MG capsule Take 1 capsule by mouth at bedtime. 12/30/20   [provider]  insulin NPH-regular Human (NOVOLIN 70/30) (70-30) 100 UNIT/ML injection Inject 40 Units into the skin in the morning and at bedtime. Inject 40 units in morning and 40 units every evening    [provider]  lisinopril (PRINIVIL,ZESTRIL) 20 MG tablet Take 1 tablet (20 mg total) by mouth daily. 06/09/17   Cassandria Anger, MD  metoprolol succinate (TOPROL-XL) 100 MG 24 hr tablet Take 1.5 tablets (150 mg total) by mouth daily. 11/19/21   Arnoldo Lenis, MD  nitroGLYCERIN (NITROSTAT) 0.4 MG SL tablet DISSOLVE 1 TABLET SUBLINGUALLY AS NEEDED FOR CHEST PAIN, MAY REPEAT EVERY 5 MINUTES. AFTER 3 CALL 911. 10/30/21   Arnoldo Lenis, MD  pantoprazole (PROTONIX) 40 MG tablet TAKE ONE TABLET BY MOUTH 2 TIMES A DAY BEFORE A MEAL 11/18/21   Mahala Menghini, PA-C  predniSONE (DELTASONE) 10 MG tablet Take 6 tablets (60 mg total) by mouth daily with breakfast. And decrease by  one tablet daily 09/01/21   Tat, Shanon Brow, MD  spironolactone (ALDACTONE) 25 MG tablet TAKE 1 TABLET BY MOUTH ONCE A DAY. 11/19/21   Arnoldo Lenis, MD  SUMAtriptan (IMITREX) 25 MG tablet Take 25 mg by mouth every 2 (two) hours as needed. 10/10/20   [provider]  SYMBICORT 160-4.5 MCG/ACT inhaler Inhale 2 puffs into the lungs 2 (two) times daily. 09/24/20   [provider]  traMADol (ULTRAM) 50 MG tablet Take 50-100 mg by mouth in the morning and at bedtime. 09/20/19   [provider]  zolpidem (AMBIEN) 5 MG tablet Take 5 mg by mouth at bedtime as needed. 09/26/20   [provider]      Allergies    Caduet [amlodipine-atorvastatin]    Review of Systems   Review of Systems  Constitutional:  Positive for fatigue. Negative for fever.  Respiratory:  Positive for cough, sputum production, shortness of breath and wheezing. Negative for hemoptysis.   Cardiovascular:  Negative for chest pain.  Gastrointestinal:  Negative for abdominal pain, nausea and vomiting.  Neurological:  Negative for syncope.   Physical Exam Updated Vital Signs BP (!) 128/53    Pulse 82    Temp 98.3 F (36.8 C) (Oral)    Resp 20    SpO2 (!) 86%  Physical Exam Vitals and nursing note reviewed.  Constitutional:      General: He is not in acute distress.    Appearance: He is obese.  HENT:     Head: Normocephalic and atraumatic.  Eyes:     Conjunctiva/sclera: Conjunctivae normal.  Cardiovascular:     Rate and Rhythm: Normal rate and regular rhythm.     Heart sounds: No murmur heard. Pulmonary:     Effort: Tachypnea and accessory muscle usage present. No respiratory distress.     Breath sounds: Wheezing and rhonchi present.  Abdominal:     Palpations: Abdomen is soft.     Tenderness: There is no abdominal tenderness. There is no guarding or rebound.  Musculoskeletal:        General: No swelling.     Cervical back: Neck supple.     Right lower leg: No tenderness.     Left lower leg: No tenderness.  Skin:    General: Skin is warm and dry.     Capillary Refill: Capillary refill takes less than 2 seconds.  Neurological:     General: No focal deficit present.     Mental Status: He is alert.    ED Results / Procedures / Treatments   Labs (all labs ordered are listed, but only abnormal results are displayed) Labs Reviewed  RESP PANEL BY RT-PCR (FLU A&B, COVID) ARPGX2  CBC  BASIC METABOLIC PANEL  BLOOD GAS, VENOUS  PROTIME-INR  BRAIN NATRIURETIC PEPTIDE  TYPE AND SCREEN    EKG None  Radiology No results found.  Procedures Procedures  {Document cardiac  monitor, telemetry assessment procedure when appropriate:1}  Medications Ordered in ED Medications  ipratropium-albuterol (DUONEB) 0.5-2.5 (3) MG/3ML nebulizer solution 3 mL (has no administration in time range)  magnesium sulfate IVPB 2 g 50 mL (has no administration in time range)  methylPREDNISolone sodium succinate (SOLU-MEDROL) 125 mg/2 mL injection 125 mg (has no administration in time range)    ED Course/ Medical Decision Making/ A&P                           Medical Decision Making Amount and/or Complexity  of Data Reviewed Labs: ordered. Radiology: ordered.  Risk Prescription drug management.  Gregory Alvarado was evaluated in Emergency Department on 11/24/2021 for the symptoms described in the history of present illness. He was evaluated in the context of the global COVID-19 pandemic, which necessitated consideration that the patient might be at risk for infection with the SARS-CoV-2 virus that causes COVID-19. Institutional protocols and algorithms that pertain to the evaluation of patients at risk for COVID-19 are in a state of rapid change based on information released by regulatory bodies including the CDC and federal and state organizations. These policies and algorithms were followed during the patient's care in the ED. This patient complains of ***; this involves an extensive number of treatment Options and is a complaint that carries with it a high risk of complications and morbidity. The differential includes ***  I ordered, reviewed and interpreted labs, which included *** I ordered medication *** and reviewed PMP when indicated. I ordered imaging studies which included *** and I independently    visualized and interpreted imaging which showed *** Additional history obtained from *** Previous records obtained and reviewed *** I consulted *** and discussed lab and imaging findings and discussed disposition.  Cardiac monitoring reviewed, *** Social determinants  considered, *** Critical Interventions: ***  After the interventions stated above, I reevaluated the patient and found *** Admission and further testing considered, ***    {Document critical care time when appropriate:1} {Document review of labs and clinical decision tools ie heart score, Chads2Vasc2 etc:1}  {Document your independent review of radiology images, and any outside records:1} {Document your discussion with family members, caretakers, and with consultants:1} {Document social determinants of health affecting pt's care:1} {Document your decision making why or why not admission, treatments were needed:1} Final Clinical Impression(s) / ED Diagnoses Final diagnoses:  None    Rx / DC Orders ED Discharge Orders     None

## 2021-11-24 NOTE — Assessment & Plan Note (Signed)
BNP 433 (this was 487 about 2 months ago) ?Continue total input/output, daily weights and fluid restriction ?Continue home Lasix ?Continue Cardiac diet  ?Echocardiogram done on 08/28/21 showed LVEF of 55 to 60%.  LV demonstrates RWMA, LV diastolic parameters are consistent with G1 DD. ? ? ? ?

## 2021-11-24 NOTE — Assessment & Plan Note (Addendum)
Work-up in the ED showed H/H 6.4/25.3 (this was 9.7/34.1 on 08/29/2021) ?Hemoccult was positive ?Type and crossmatch was done ?2 units of PRBC will be transfused ?Continue IV Protonix drip ?Gastroenterologist  will be consulted in the morning ? ?

## 2021-11-24 NOTE — ED Triage Notes (Signed)
Sent from PCP office due to shortness of breath and low hemoglobin ?

## 2021-11-24 NOTE — Assessment & Plan Note (Signed)
Continue lisinopril, Toprol XL ?

## 2021-11-24 NOTE — ED Notes (Signed)
Report called to [place] on 300 ?

## 2021-11-24 NOTE — ED Notes (Signed)
Critical po2V less than 31  ?

## 2021-11-24 NOTE — H&P (Incomplete)
History and Physical    Patient: Gregory Alvarado QPY:[id number] DOB: 1951/03/12 DOA: 11/24/2021 DOS: the patient was seen and examined on 11/24/2021 PCP: Celene Squibb, MD  Patient coming from: Home  Chief Complaint:  Chief Complaint  Patient presents with   Shortness of Breath   HPI: Gregory Alvarado is a 71 y.o. male with medical history significant of hypertension, hyperlipidemia, T2WP, COPD, diastolic CHF, GI bleed (gastric ulcers on EGD 10/2019), poor compliance with treatment regimen who presents emergency department after being asked to go to the ED by his PCP due to low hemoglobin.  Apparently,  ED course: In the emergency department, she was tachypneic and O2 sat was 92-90% on supplemental oxygen at 2 LPM.  ABG showed pH 7.36, PO2  <31, PCO2 69, bicarb 39.5 at FiO2 of 28%.  Other vital signs are within normal range.  Work-up in the ED showed H/H 6.4/25.3 (this was 9.7/34.1 on 08/29/2021).  BMP showed hyponatremia, hyperglycemia, BNP 433 (this was 487 about 2 months ago), FOBT was positive.  Influenza A, B, SARS coronavirus was negative. Chest x-ray showed no active disease Breathing treatment was provided, Solu-Medrol was given, magnesium was given and patient was treated with IV Protonix.  Hospitalist was asked to admit patient for further evaluation and management.   Review of Systems: As mentioned in the history of present illness. All other systems reviewed and are negative.  Past Medical History:  Diagnosis Date   Anemia    Arthritis    left shoulder   COPD (chronic obstructive pulmonary disease) ([place])    Coronary artery disease 09/11/2020   NSTEMI and 3 stents in [facility]   Diabetes mellitus without complication ([place])    Dyslipidemia    Hypertension    Insomnia    Insomnia    Morbid obesity ([place])    Tobacco use    Past Surgical History:  Procedure Laterality Date   BIOPSY  10/28/2019   Procedure: BIOPSY;  Surgeon: Danie Binder, MD;  [facility];   Service: Endoscopy;;  gastric   BIOPSY  04/15/2020   Procedure: BIOPSY;  Surgeon: Daneil Dolin, MD;  Location: [facility];  Service: Endoscopy;;  gastric   CARDIAC CATHETERIZATION  08/2020   3 stents placed   COLON RESECTION  1990s?   DIVERTICULITIS   COLONOSCOPY WITH PROPOFOL N/A 04/15/2020   Rourk: Poor colon prep. two 6 to 8 mm polyps removed from the hepatic flexure, tubular adenomas.  Repeat colonoscopy later this year.   ESOPHAGOGASTRODUODENOSCOPY (EGD) WITH PROPOFOL N/A 10/28/2019   Dr. Oneida Alar: Multiple cratered gastric ulcers, gastritis/duodenitis.  Path showed H. pylori.  Patient treated with amoxicillin/Biaxin/PPI twice daily.   ESOPHAGOGASTRODUODENOSCOPY (EGD) WITH PROPOFOL N/A 04/15/2020   Rourk: Patchy gastric erythema status post biopsy to document eradication of H. pylori.  Biopsy showed persistent H. pylori.  Previously noted gastric ulcer is completely healed.   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   HERNIA REPAIR     UHR   MALONEY DILATION N/A 04/15/2020   Procedure: Venia Minks DILATION;  Surgeon: Daneil Dolin, MD;  Location: [facility];  Service: Endoscopy;  Laterality: N/A;   POLYPECTOMY  04/15/2020   Procedure: POLYPECTOMY;  Surgeon: Daneil Dolin, MD;  Location: [facility];  Service: Endoscopy;;  colon   ROTATOR CUFF REPAIR Bilateral    TYMPANOPLASTY     Social History:  reports that he has been smoking cigarettes. He has been smoking an average of 2 packs per day. He has never used smokeless tobacco. He reports current alcohol use. He reports that he does not use drugs.  Allergies  Allergen Reactions   Caduet [Amlodipine-Atorvastatin] Swelling    Family History  Problem Relation Age of Onset   Diabetes Mother    Heart attack Mother    Heart attack Father    Heart attack Brother    Colon cancer Neg Hx    Stomach cancer Neg Hx     Prior to  Admission medications   Medication Sig Start Date End Date Taking? Authorizing Provider  acetaminophen (TYLENOL) 325 MG tablet Take 2 tablets (650 mg total) by mouth every 6 (six) hours as needed for mild pain (or Fever >/= 101). 10/29/19   Roxan Hockey, MD  aspirin EC 81 MG tablet Take 81 mg by mouth daily. Swallow whole.    [provider]  atorvastatin (LIPITOR) 40 MG tablet Take 40 mg by mouth at bedtime. 08/30/19   [provider]  cyanocobalamin (,VITAMIN B-12,) 1000 MCG/ML injection Inject 1,000 mcg into the muscle every 30 (thirty) days.     [provider]  FERREX 150 150 MG capsule Take 150 mg by mouth daily. 12/04/20   [provider]  folic acid (FOLVITE) 1 MG tablet Take 1 tablet (1 mg total) by mouth daily. Patient taking differently: Take 1 mg by mouth at bedtime. 10/30/19   Roxan Hockey, MD  furosemide (LASIX) 40 MG tablet Take 1 tablet (40 mg total) by mouth daily. 08/31/21   Orson Eva, MD  gabapentin (NEURONTIN) 300 MG capsule Take 1 capsule by mouth at bedtime. 12/30/20   [provider]  insulin NPH-regular Human (NOVOLIN 70/30) (70-30) 100 UNIT/ML injection Inject 40 Units into the skin in the morning and at bedtime. Inject 40 units in morning and 40 units every evening    [provider]  lisinopril (PRINIVIL,ZESTRIL) 20 MG tablet Take 1 tablet (20 mg total) by mouth daily. 06/09/17   Cassandria Anger, MD  metoprolol succinate (TOPROL-XL) 100 MG 24 hr tablet Take 1.5 tablets (150 mg total) by mouth daily. 11/19/21   Arnoldo Lenis, MD  nitroGLYCERIN (NITROSTAT) 0.4 MG SL tablet DISSOLVE 1 TABLET SUBLINGUALLY AS NEEDED FOR CHEST PAIN, MAY REPEAT EVERY 5 MINUTES. AFTER 3 CALL 911. 10/30/21   Arnoldo Lenis, MD  pantoprazole (PROTONIX) 40 MG tablet TAKE ONE TABLET BY MOUTH 2 TIMES A DAY BEFORE A MEAL 11/18/21   Mahala Menghini, PA-C  predniSONE (DELTASONE) 10 MG tablet Take 6 tablets (60 mg total) by mouth daily with  breakfast. And decrease by one tablet daily 09/01/21   Tat, Shanon Brow, MD  spironolactone (ALDACTONE) 25 MG tablet TAKE 1 TABLET BY MOUTH ONCE A DAY. 11/19/21   Arnoldo Lenis, MD  SUMAtriptan (IMITREX) 25 MG tablet Take 25 mg by mouth every 2 (two) hours as needed. 10/10/20   [provider]  SYMBICORT 160-4.5 MCG/ACT inhaler Inhale 2 puffs into the lungs 2 (two) times daily. 09/24/20   [provider]  traMADol (ULTRAM) 50 MG tablet Take 50-100 mg by mouth in the morning and at bedtime. 09/20/19   [provider]  zolpidem (AMBIEN) 5 MG tablet Take 5 mg by mouth at bedtime as needed. 09/26/20   [provider]    Physical Exam: Vitals:   11/24/21 1818 11/24/21 1833 11/24/21 1900 11/24/21 2000  BP: 137/66 Marland Kitchen)  128/53 (!) 130/56 (!) 149/61  Pulse: 82   80  Resp: (!) 21 20 (!) 26 (!) 22  Temp: 98.3 F (36.8 C)     TempSrc: Oral     SpO2: (!) 86% 92%  95%   General: Obese patient who was awake and alert and oriented x3. Not in any acute distress.  HEENT: NCAT.  PERRLA. EOMI. Sclerae anicteric.  Moist mucosal membranes. Neck: Neck supple without lymphadenopathy. No carotid bruits. No masses palpated.  Cardiovascular: Regular rate with normal S1-S2 sounds. No murmurs, rubs or gallops auscultated. No JVD.  Respiratory: Tachypnea, diffuse wheezing and rhonchi on auscultation. Abdomen: Soft, nontender, nondistended. Active bowel sounds. No masses or hepatosplenomegaly  Skin: No rashes, lesions, or ulcerations.  Dry, warm to touch. Musculoskeletal: +1 edema to 1/3 shin bilaterally.  2+ dorsalis pedis and radial pulses. Good ROM.   Psychiatric: Intact judgment and insight.  Mood appropriate to current condition. Neurologic: No focal neurological deficits. Strength is 5/5 x 4.  CN II - XII grossly intact.  Data Reviewed:  There are no new results to review at this time.  Assessment and Plan: No notes have been filed under this hospital service. Service:  Hospitalist     Advance Care Planning:   Code Status: Prior ***  Consults: ***  Family Communication: ***  Severity of Illness: {Observation/Inpatient:21159}  Author: Bernadette Hoit, DO 11/24/2021 8:34 PM  For on call review [email or web address].

## 2021-11-25 DIAGNOSIS — D649 Anemia, unspecified: Secondary | ICD-10-CM

## 2021-11-25 DIAGNOSIS — K922 Gastrointestinal hemorrhage, unspecified: Secondary | ICD-10-CM

## 2021-11-25 LAB — CBC
HCT: 27.5 % — ABNORMAL LOW (ref 39.0–52.0)
Hemoglobin: 7.4 g/dL — ABNORMAL LOW (ref 13.0–17.0)
MCH: 21.4 pg — ABNORMAL LOW (ref 26.0–34.0)
MCHC: 26.9 g/dL — ABNORMAL LOW (ref 30.0–36.0)
MCV: 79.7 fL — ABNORMAL LOW (ref 80.0–100.0)
Platelets: 217 10*3/uL (ref 150–400)
RBC: 3.45 MIL/uL — ABNORMAL LOW (ref 4.22–5.81)
RDW: 17.6 % — ABNORMAL HIGH (ref 11.5–15.5)
WBC: 10.1 10*3/uL (ref 4.0–10.5)
nRBC: 0.2 % (ref 0.0–0.2)

## 2021-11-25 LAB — GLUCOSE, CAPILLARY
Glucose-Capillary: 257 mg/dL — ABNORMAL HIGH (ref 70–99)
Glucose-Capillary: 259 mg/dL — ABNORMAL HIGH (ref 70–99)

## 2021-11-25 LAB — PHOSPHORUS: Phosphorus: 3 mg/dL (ref 2.5–4.6)

## 2021-11-25 LAB — MAGNESIUM: Magnesium: 2.1 mg/dL (ref 1.7–2.4)

## 2021-11-25 MED ORDER — IPRATROPIUM-ALBUTEROL 0.5-2.5 (3) MG/3ML IN SOLN
3.0000 mL | Freq: Four times a day (QID) | RESPIRATORY_TRACT | Status: DC
  Administered 2021-11-25 – 2021-11-26 (×6): 3 mL via RESPIRATORY_TRACT
  Filled 2021-11-25 (×5): qty 3

## 2021-11-25 MED ORDER — AZITHROMYCIN 250 MG PO TABS
250.0000 mg | ORAL_TABLET | Freq: Every day | ORAL | Status: DC
Start: 2021-11-26 — End: 2021-11-27
  Administered 2021-11-27: 250 mg via ORAL
  Filled 2021-11-25 (×2): qty 1

## 2021-11-25 MED ORDER — NICOTINE 21 MG/24HR TD PT24
21.0000 mg | MEDICATED_PATCH | Freq: Every day | TRANSDERMAL | Status: DC
Start: 2021-11-25 — End: 2021-11-27
  Administered 2021-11-25 – 2021-11-27 (×4): 21 mg via TRANSDERMAL
  Filled 2021-11-25 (×4): qty 1

## 2021-11-25 MED ORDER — IPRATROPIUM-ALBUTEROL 0.5-2.5 (3) MG/3ML IN SOLN
3.0000 mL | RESPIRATORY_TRACT | Status: DC | PRN
Start: 2021-11-25 — End: 2021-11-26

## 2021-11-25 MED ORDER — AZITHROMYCIN 250 MG PO TABS
500.0000 mg | ORAL_TABLET | Freq: Every day | ORAL | Status: AC
  Administered 2021-11-25: 500 mg via ORAL
  Filled 2021-11-25: qty 2

## 2021-11-25 MED ORDER — PANTOPRAZOLE SODIUM 40 MG IV SOLR
40.0000 mg | Freq: Two times a day (BID) | INTRAVENOUS | Status: DC
  Administered 2021-11-26 – 2021-11-27 (×3): 40 mg via INTRAVENOUS
  Filled 2021-11-25 (×3): qty 10

## 2021-11-25 MED ORDER — DM-GUAIFENESIN ER 30-600 MG PO TB12
1.0000 | ORAL_TABLET | Freq: Two times a day (BID) | ORAL | Status: DC
  Administered 2021-11-25 – 2021-11-27 (×4): 1 via ORAL
  Filled 2021-11-25 (×5): qty 1

## 2021-11-25 MED ORDER — MELATONIN 3 MG PO TABS
6.0000 mg | ORAL_TABLET | Freq: Once | ORAL | Status: AC
  Administered 2021-11-25: 6 mg via ORAL
  Filled 2021-11-25: qty 2

## 2021-11-25 MED ORDER — METHYLPREDNISOLONE SODIUM SUCC 40 MG IJ SOLR
40.0000 mg | Freq: Two times a day (BID) | INTRAMUSCULAR | Status: DC
  Administered 2021-11-25 – 2021-11-26 (×3): 40 mg via INTRAVENOUS
  Filled 2021-11-25 (×3): qty 1

## 2021-11-25 NOTE — Assessment & Plan Note (Signed)
BMI 38.9 kg/m?; diet and lifestyle modification ?

## 2021-11-25 NOTE — Assessment & Plan Note (Signed)
Continue management as described for COPD exacerbation ?Continue supplemental oxygen to maintain O2 sat > 92% ?

## 2021-11-25 NOTE — Assessment & Plan Note (Signed)
Echocardiogram done on 08/28/21 showed LVEF of 55 to 60%.  LV demonstrates RWMA, LV diastolic parameters are consistent with G1 DD. ?Continue home meds ? ?

## 2021-11-25 NOTE — Assessment & Plan Note (Signed)
Continue duo nebs, Mucinex, Solu-Medrol, azithromycin. ?Continue Protonix to prevent steroid-induced ulcer ?Continue incentive spirometry and flutter valve ?Continue supplemental oxygen to maintain O2 sat > 92% with plan to wean patient off oxygen as tolerated ? ?

## 2021-11-25 NOTE — Progress Notes (Signed)
Alert and oriented on admission to 316.  Stated on 2 liters O2 at home and smokes almost 3 packs of cigarettes daily. First unit of blood transfusing with no adverse reaction and refused second IV at first for protonix infusion but then agreed.  Contacted Dr. Josephine Cables for nicotine patch. NPO after midnight.  Gave mucinex with sip of gingerale ?

## 2021-11-25 NOTE — Progress Notes (Signed)
?PROGRESS NOTE ? ? ? ?Gregory Alvarado  YIR:[id number] DOB: [birth year] DOA: 11/24/2021 ?PCP: Celene Squibb, MD ? ? ?Brief Narrative:  ?Per HPI: ?Gregory Alvarado is a 71 y.o. male with medical history significant of hypertension, hyperlipidemia, J0KX, COPD, diastolic CHF, GI bleed (gastric ulcers on EGD 10/2019), poor compliance with treatment regimen who presents emergency department after being asked to go to the ED by his PCP due to low hemoglobin.  Apparently, patient has been feeling weak and fatigued for a few months, but this worsened within the last several days, he endorsed noticing black stools within last few days.  Patient complained of shortness of breath associated with productive cough of white sputum, he complained of wheezing, but denies fever, chills, headache, nausea, vomiting, abdominal pain or diarrhea.  Patient continues to smoke cigarettes. ? ?11/25/21: Patient has been admitted with acute symptomatic blood loss anemia in the setting of GI bleed.  GI planning for upper endoscopy today.  He continues to remain on IV PPI drip.  ? ? ?Assessment & Plan: ?  ?Principal Problem: ?  GI bleed ?Active Problems: ?  COPD with acute exacerbation ([place]) ?  Symptomatic anemia ?  Acute on chronic respiratory failure with hypoxia and hypercapnia (HCC) ?  Chronic diastolic CHF (congestive heart failure) ([place]) ?  Elevated brain natriuretic peptide (BNP) level ?  Essential hypertension ?  Hyperlipidemia ?  Class 2 obesity due to excess calories with body mass index (BMI) of 38.0 to 38.9 in adult ?  Type 2 diabetes mellitus with hyperglycemia (HCC) ? ?Assessment and Plan: ? ?Acute symptomatic blood loss anemia secondary to GI bleed ?Hemoglobin trend improved after 2 unit PRBC transfusion ?Continue PPI infusion ?GI planning for endoscopy ?Monitor hemoglobin ?  ?  ?Acute on chronic respiratory failure with hypoxia and hypercapnia (HCC) ?Continue management as described for COPD exacerbation ?Continue supplemental oxygen to  maintain O2 sat > 92% ?   ?  ?COPD with acute exacerbation ([place]) ?Continue duo nebs, Mucinex, Solu-Medrol, azithromycin. ?Continue Protonix to prevent steroid-induced ulcer ?Continue incentive spirometry and flutter valve ?Continue supplemental oxygen to maintain O2 sat > 92% with plan to wean patient off oxygen as tolerated ?  ?  ?Elevated brain natriuretic peptide (BNP) level ?BNP 433 (this was 487 about 2 months ago) ?Continue total input/output, daily weights and fluid restriction ?Continue home Lasix ?Continue Cardiac diet  ?Echocardiogram done on 08/28/21 showed LVEF of 55 to 60%.  LV demonstrates RWMA, LV diastolic parameters are consistent with G1 DD. ?  ?  ?Chronic diastolic CHF (congestive heart failure) ([place]) ?Echocardiogram done on 08/28/21 showed LVEF of 55 to 60%.  LV demonstrates RWMA, LV diastolic parameters are consistent with G1 DD. ?Continue home meds ?  ?  ?Type 2 diabetes mellitus with hyperglycemia (HCC) ?Continue ISS and hypoglycemic protocol ?  ?Class 2 obesity due to excess calories with body mass index (BMI) of 38.0 to 38.9 in adult ?BMI 38.9 kg/m?; diet and lifestyle modification ?  ?Hyperlipidemia ?Continue Lipitor ?  ?Essential hypertension ?Continue lisinopril, Toprol XL ? ?  ?DVT prophylaxis: SCDs ?Code Status: Full ?Family Communication: None at bedside ?Disposition Plan:  ?Status is: Inpatient ?Remains inpatient appropriate because: Need for EEG and close monitoring.  IV medications. ? ? ?Consultants:  ?GI ? ?Procedures:  ?See below ? ?Antimicrobials:  ?None ? ? ?Subjective: ?Patient seen and evaluated today with no new acute complaints or concerns. No acute concerns or events noted overnight.  No further bleeding episodes noted since admission. ? ?  Objective: ?Vitals:  ? 11/25/21 0318 11/25/21 0513 11/25/21 0515 11/25/21 0700  ?BP:  (!) 124/44 (!) 124/44   ?Pulse:  75 75   ?Resp:  20 20   ?Temp:  98.5 ?F (36.9 ?C) 98.5 ?F (36.9 ?C)   ?TempSrc:  Oral Oral   ?SpO2: 95% 95% 95% 98%   ?Weight:      ?Height:      ? ? ?Intake/Output Summary (Last 24 hours) at 11/25/2021 1122 ?Last data filed at 11/25/2021 0836 ?Gross per 24 hour  ?Intake 771.41 ml  ?Output 550 ml  ?Net 221.41 ml  ? ?Filed Weights  ? 11/24/21 2258  ?Weight: 136.1 kg  ? ? ?Examination: ? ?General exam: Appears calm and comfortable  ?Respiratory system: Clear to auscultation. Respiratory effort normal. ?Cardiovascular system: S1 & S2 heard, RRR.  ?Gastrointestinal system: Abdomen is soft ?Central nervous system: Alert and awake ?Extremities: No edema ?Skin: No significant lesions noted ?Psychiatry: Flat affect. ? ? ? ?Data Reviewed: I have personally reviewed following labs and imaging studies ? ?CBC: ?Recent Labs  ?Lab 11/24/21 ?[place] 11/25/21 ?6834  ?WBC 10.1 10.1  ?HGB 6.4* 7.4*  ?HCT 25.3* 27.5*  ?MCV 80.6 79.7*  ?PLT 265 217  ? ?Basic Metabolic Panel: ?Recent Labs  ?Lab 11/24/21 ?[place] 11/25/21 ?1962  ?NA 133*  --   ?K 4.6  --   ?CL 93*  --   ?CO2 32  --   ?GLUCOSE 188*  --   ?BUN 20  --   ?CREATININE 1.24  --   ?CALCIUM 8.9  --   ?MG  --  2.1  ?PHOS  --  3.0  ? ?GFR: ?Estimated Creatinine Clearance: 81.5 mL/min (by C-G formula based on SCr of 1.24 mg/dL). ?Liver Function Tests: ?No results for input(s): AST, ALT, ALKPHOS, BILITOT, PROT, ALBUMIN in the last 168 hours. ?No results for input(s): LIPASE, AMYLASE in the last 168 hours. ?No results for input(s): AMMONIA in the last 168 hours. ?Coagulation Profile: ?Recent Labs  ?Lab 11/24/21 ?1903  ?INR 1.1  ? ?Cardiac Enzymes: ?No results for input(s): CKTOTAL, CKMB, CKMBINDEX, TROPONINI in the last 168 hours. ?BNP (last 3 results) ?No results for input(s): PROBNP in the last 8760 hours. ?HbA1C: ?No results for input(s): HGBA1C in the last 72 hours. ?CBG: ?Recent Labs  ?Lab 11/24/21 ?2238  ?GLUCAP 168*  ? ?Lipid Profile: ?No results for input(s): CHOL, HDL, LDLCALC, TRIG, CHOLHDL, LDLDIRECT in the last 72 hours. ?Thyroid Function Tests: ?No results for input(s): TSH, T4TOTAL, FREET4,  T3FREE, THYROIDAB in the last 72 hours. ?Anemia Panel: ?No results for input(s): VITAMINB12, FOLATE, FERRITIN, TIBC, IRON, RETICCTPCT in the last 72 hours. ?Sepsis Labs: ?No results for input(s): PROCALCITON, LATICACIDVEN in the last 168 hours. ? ?Recent Results (from the past 240 hour(s))  ?Resp Panel by RT-PCR (Flu A&B, Covid) Nasopharyngeal Swab     Status: None  ? Collection Time: 11/24/21  6:43 PM  ? Specimen: Nasopharyngeal Swab; Nasopharyngeal(NP) swabs in vial transport medium  ?Result Value Ref Range Status  ? SARS Coronavirus 2 by RT PCR NEGATIVE NEGATIVE Final  ?  Comment: (NOTE) ?SARS-CoV-2 target nucleic acids are NOT DETECTED. ? ?The SARS-CoV-2 RNA is generally detectable in upper respiratory ?specimens during the acute phase of infection. The lowest ?concentration of SARS-CoV-2 viral copies this assay can detect is ?138 copies/mL. A negative result does not preclude SARS-Cov-2 ?infection and should not be used as the sole basis for treatment or ?other patient management decisions. A negative result may occur with  ?  improper specimen collection/handling, submission of specimen other ?than nasopharyngeal swab, presence of viral mutation(s) within the ?areas targeted by this assay, and inadequate number of viral ?copies(<138 copies/mL). A negative result must be combined with ?clinical observations, patient history, and epidemiological ?information. The expected result is Negative. ? ?Fact Sheet for Patients:  ?EntrepreneurPulse.com.au ? ?Fact Sheet for Healthcare Providers:  ?IncredibleEmployment.be ? ?This test is no t yet approved or cleared by the [place] FDA and  ?has been authorized for detection and/or diagnosis of SARS-CoV-2 by ?FDA under an Emergency Use Authorization (EUA). This EUA will remain  ?in effect (meaning this test can be used) for the duration of the ?COVID-19 declaration under Section 564(b)(1) of the Act, 21 ?U.S.C.section 360bbb-3(b)(1),  unless the authorization is terminated  ?or revoked sooner.  ? ? ?  ? Influenza A by PCR NEGATIVE NEGATIVE Final  ? Influenza B by PCR NEGATIVE NEGATIVE Final  ?  Comment: (NOTE) ?The Xpert Xpress SARS-CoV-2/FLU/

## 2021-11-25 NOTE — Consult Note (Addendum)
? ?Gastroenterology Consult  ? ?Referring Provider: No ref. provider found ?Primary Care Physician:  Celene Squibb, MD ?Primary Gastroenterologist:  Dr. Gala Romney ? ?Patient ID: Gregory Alvarado; [id number]; Feb 06, 1951  ? ?Admit date: 11/24/2021 ? LOS: 1 day  ? ?Date of Consultation: 11/25/2021 ? ?Reason for Consultation:  symptomatic anemia, melena ? ?History of Present Illness  ? ?Gregory Alvarado is a 71 y.o. 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  History of H. pylori status post treatment without confirmation of eradication. Patient presented to the ED at the request of his PCP due to low hemoglobin.  Last hemoglobin noted in the chart was 9.7 on 08/29/2021.  He presented with complaints of feeling weak and fatigued for few months, that has worsened over the last several days, and noticed black stools within the last few days.  GI consulted for management of GI bleeding. ? ?ED Course: ?Patient presented with hypoxemia, with PO2 venous <31, PCO2 69 on room air.  He was placed on 2 L nasal cannula. ?Hemoglobin 6.4 -2 units PRBCs given ?FOBT positive ?He was placed on IV Protonix drip. ? 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 Prior EGD August 2021 with normal esophagus post dilation, patchy gastric erythema, and previously noted gastric ulcer completely healed, normal duodenum. ? ?Last colonoscopy August 2021 with inadequate preparation, formed stool elements of vegetable matter throughout the colon.  2 sessile polyps found in the hepatic flexure (6-1m) were removed (pahtology-hyperplastic, tubular adenoma), otherwise exam normal. ? ?Last admission for anemia on 01/06/2021, presented with hemoglobin 6.5 received 2 units with improvement to 7.5. ? ?Recent hospitalization in  December 2022 for COPD exacerbation.  His hemoglobin at that time was stable (9.7, 10.6). ? ? ?Consult: ?Chronically has black stools, none since he's been here. Denies hematochezia, dysphagia, abdominal pain, or chest pain.  Denies any dizziness or weakness, and is feeling good since his blood transfusion. Reports shortness of breath is okay when hes not anemic. Uses 4L [place] oxygen at home all the time. Has not been taking iron supplements or any herbals. Denies any NSAID use and admits to only taking baby aspirin, no other anticoagulants. GERD has been control on home BID PPI, states he is taking all of his meds. He reports that he finished and took all of the required meds he was prescribed in the past for the H. Pylori infection. Feels as though this bleeding and needing to come in and out is getting ridiculous and wants it to be fixed.  Denies any recent alcohol use.  Continues to smoke, has about 2 packs/day.  ? ? ?Past Medical History:  ?Diagnosis Date  ? Anemia   ? Arthritis   ? left shoulder  ? COPD ([place] obstructive pulmonary disease) [place]   ? Coronary artery disease 09/11/2020  ? [facility] ? Diabetes [place] without complication [place]   ? Dyslipidemia   ? Hypertension   ? Insomnia   ? Insomnia   ? Morbid obesity ([place]   ? Tobacco use   ? ? ?Past Surgical History:  ?Procedure Laterality Date  ? BIOPSY  10/28/2019  ? Procedure: BIOPSY;  Surgeon: FDanie Binder MD;  [facility];  Service: Endoscopy;;  gastric  ?  BIOPSY  04/15/2020  ? Procedure: BIOPSY;  Surgeon: Daneil Dolin, MD;  [facility];  Service: Endoscopy;;  gastric  ? CARDIAC CATHETERIZATION  08/2020  ? 3 stents placed  ? COLON RESECTION  1990s?  ? DIVERTICULITIS  ? COLONOSCOPY WITH PROPOFOL N/A 04/15/2020  ? Rourk: Poor colon prep. two 6 to 8 mm polyps removed from the hepatic flexure, tubular adenomas.  Repeat colonoscopy later this year.  ? ESOPHAGOGASTRODUODENOSCOPY (EGD) WITH PROPOFOL N/A  10/28/2019  ? Dr. Oneida Alar: Multiple cratered gastric ulcers, gastritis/duodenitis.  Path showed H. pylori.  Patient treated with amoxicillin/Biaxin/PPI twice daily.  ? ESOPHAGOGASTRODUODENOSCOPY (EGD) WITH PROPOFOL N/A 04/15/2020  ? Rourk: Patchy gastric erythema status post biopsy to document eradication of H. pylori.  Biopsy showed persistent H. pylori.  Previously noted gastric ulcer is completely healed.  ? ESOPHAGOGASTRODUODENOSCOPY (EGD) WITH PROPOFOL N/A 07/23/2020  ? 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  ? HERNIA REPAIR    ? UHR  ? MALONEY DILATION N/A 04/15/2020  ? Procedure: MALONEY DILATION;  Surgeon: Daneil Dolin, MD;  [facility];  Service: Endoscopy;  Laterality: N/A;  ? POLYPECTOMY  04/15/2020  ? Procedure: POLYPECTOMY;  Surgeon: Daneil Dolin, MD;  [facility];  Service: Endoscopy;;  colon  ? ROTATOR CUFF REPAIR Bilateral   ? TYMPANOPLASTY    ? ? ?Prior to Admission medications   ?Medication Sig Start Date End Date Taking? Authorizing Provider  ?acetaminophen (TYLENOL) 325 MG tablet Take 2 tablets (650 mg total) by mouth every 6 (six) hours as needed for mild pain (or Fever >/= 101). 10/29/19   Roxan Hockey, MD  ?aspirin EC 81 MG tablet Take 81 mg by mouth daily. Swallow whole.    [provider]  ?atorvastatin (LIPITOR) 40 MG tablet Take 40 mg by mouth at bedtime. 08/30/19   [provider]  ?cyanocobalamin (,VITAMIN B-12,) 1000 MCG/ML injection Inject 1,000 mcg into the muscle every 30 (thirty) days.     [provider]  ?FERREX 150 150 MG capsule Take 150 mg by mouth daily. 12/04/20   [provider]  ?folic acid (FOLVITE) 1 MG tablet Take 1 tablet (1 mg total) by mouth daily. ?Patient taking differently: Take 1 mg by mouth at bedtime. 10/30/19   Roxan Hockey, MD  ?furosemide (LASIX) 40 MG tablet Take 1 tablet (40 mg total) by mouth daily. 08/31/21   Orson Eva, MD  ?gabapentin  (NEURONTIN) 300 MG capsule Take 1 capsule by mouth at bedtime. 12/30/20   [provider]  ?insulin NPH-regular Human (NOVOLIN 70/30) (70-30) 100 UNIT/ML injection Inject 40 Units into the skin in the morning and at bedtime. Inject 40 units in morning and 40 units every evening    [provider]  ?lisinopril (PRINIVIL,ZESTRIL) 20 MG tablet Take 1 tablet (20 mg total) by mouth daily. 06/09/17   Cassandria Anger, MD  ?metoprolol succinate (TOPROL-XL) 100 MG 24 hr tablet Take 1.5 tablets (150 mg total) by mouth daily. 11/19/21   Arnoldo Lenis, MD  ?nitroGLYCERIN (NITROSTAT) 0.4 MG SL tablet DISSOLVE 1 TABLET SUBLINGUALLY AS NEEDED FOR CHEST PAIN, MAY REPEAT EVERY 5 MINUTES. AFTER 3 CALL 911. 10/30/21   Arnoldo Lenis, MD  ?pantoprazole (PROTONIX) 40 MG tablet TAKE ONE TABLET BY MOUTH 2 TIMES A DAY BEFORE A MEAL 11/18/21   Mahala Menghini, PA-C  ?predniSONE (DELTASONE) 10 MG tablet Take 6 tablets (60 mg total) by  mouth daily with breakfast. And decrease by one tablet daily 09/01/21   Tat, Shanon Brow, MD  ?spironolactone (ALDACTONE) 25 MG tablet TAKE 1 TABLET BY MOUTH ONCE A DAY. 11/19/21   Arnoldo Lenis, MD  ?SUMAtriptan (IMITREX) 25 MG tablet Take 25 mg by mouth every 2 (two) hours as needed. 10/10/20   [provider]  ?SYMBICORT 160-4.5 MCG/ACT inhaler Inhale 2 puffs into the lungs 2 (two) times daily. 09/24/20   [provider]  ?traMADol (ULTRAM) 50 MG tablet Take 50-100 mg by mouth in the morning and at bedtime. 09/20/19   [provider]  ?zolpidem (AMBIEN) 5 MG tablet Take 5 mg by mouth at bedtime as needed. 09/26/20   [provider]  ? ? ?Current Facility-Administered Medications  ?Medication Dose Route Frequency Provider Last Rate Last Admin  ? azithromycin (ZITHROMAX) tablet 500 mg  500 mg Oral Daily Adefeso, Oladapo, DO      ? Followed by  ? [START ON 11/26/2021] azithromycin (ZITHROMAX) tablet 250 mg  250 mg Oral Daily Adefeso, Oladapo, DO      ?  dextromethorphan-guaiFENesin (MUCINEX DM) 30-600 MG per 12 hr tablet 1 tablet  1 tablet Oral BID Adefeso, Oladapo, DO   1 tablet at 11/25/21 0044  ? ipratropium-albuterol (DUONEB) 0.5-2.5 (3) MG/3ML nebulizer solution

## 2021-11-25 NOTE — Progress Notes (Signed)
Second unit prbc completed at 0500 with no adverse reaction.  Due to this , labs will be drawn at 0700.  Protonix drip infusing.  ?

## 2021-11-25 NOTE — Assessment & Plan Note (Signed)
Patient complaining of generalized weakness, shortness of breath, fatigue in the setting of decreased hemoglobin of 6.4 from 9.7 on 08/29/2021 ?Continue management as described for GI bleed ? ?

## 2021-11-25 NOTE — Hospital Course (Addendum)
Per HPI: ?Gregory Alvarado is a 71 y.o. male with medical history significant of hypertension, hyperlipidemia, J6GE, COPD, diastolic CHF, GI bleed (gastric ulcers on EGD 10/2019), poor compliance with treatment regimen who presents emergency department after being asked to go to the ED by his PCP due to low hemoglobin.  Apparently, patient has been feeling weak and fatigued for a few months, but this worsened within the last several days, he endorsed noticing black stools within last few days.  Patient complained of shortness of breath associated with productive cough of white sputum, he complained of wheezing, but denies fever, chills, headache, nausea, vomiting, abdominal pain or diarrhea.  Patient continues to smoke cigarettes. ? ?11/25/21: Patient has been admitted with acute symptomatic blood loss anemia in the setting of GI bleed.  GI planning for upper endoscopy today.  He continues to remain on IV PPI drip. ? ?11/26/21: Patient has undergone push enteroscopy with argon plasma coagulation with multiple AVMs noted.  Plan is to continue monitoring H/H and continue diet for now. ?

## 2021-11-26 ENCOUNTER — Encounter (HOSPITAL_COMMUNITY): Payer: Self-pay | Admitting: Internal Medicine

## 2021-11-26 ENCOUNTER — Inpatient Hospital Stay (HOSPITAL_COMMUNITY): Payer: Medicare Other | Admitting: Certified Registered Nurse Anesthetist

## 2021-11-26 ENCOUNTER — Encounter (HOSPITAL_COMMUNITY)
Admission: EM | Disposition: A | Discharge status: Home / Self Care | Payer: Self-pay | Source: Ambulatory Visit | Attending: Internal Medicine

## 2021-11-26 DIAGNOSIS — D509 Iron deficiency anemia, unspecified: Secondary | ICD-10-CM

## 2021-11-26 DIAGNOSIS — K31811 Angiodysplasia of stomach and duodenum with bleeding: Secondary | ICD-10-CM

## 2021-11-26 DIAGNOSIS — K5711 Diverticulosis of small intestine without perforation or abscess with bleeding: Secondary | ICD-10-CM

## 2021-11-26 DIAGNOSIS — K5521 Angiodysplasia of colon with hemorrhage: Secondary | ICD-10-CM

## 2021-11-26 DIAGNOSIS — J449 Chronic obstructive pulmonary disease, unspecified: Secondary | ICD-10-CM

## 2021-11-26 DIAGNOSIS — K5520 Angiodysplasia of colon without hemorrhage: Secondary | ICD-10-CM

## 2021-11-26 LAB — BASIC METABOLIC PANEL
Anion gap: 8 (ref 5–15)
BUN: 26 mg/dL — ABNORMAL HIGH (ref 8–23)
CO2: 30 mmol/L (ref 22–32)
Calcium: 8.8 mg/dL — ABNORMAL LOW (ref 8.9–10.3)
Chloride: 93 mmol/L — ABNORMAL LOW (ref 98–111)
Creatinine, Ser: 1.09 mg/dL (ref 0.61–1.24)
GFR, Estimated: >60 mL/min (ref >60)
Glucose, Bld: 310 mg/dL — ABNORMAL HIGH (ref 70–99)
Potassium: 4.7 mmol/L (ref 3.5–5.1)
Sodium: 131 mmol/L — ABNORMAL LOW (ref 135–145)

## 2021-11-26 LAB — GLUCOSE, CAPILLARY
Glucose-Capillary: 235 mg/dL — ABNORMAL HIGH (ref 70–99)
Glucose-Capillary: 289 mg/dL — ABNORMAL HIGH (ref 70–99)
Glucose-Capillary: 260 mg/dL — ABNORMAL HIGH (ref 70–99)
Glucose-Capillary: 196 mg/dL — ABNORMAL HIGH (ref 70–99)
Glucose-Capillary: 185 mg/dL — ABNORMAL HIGH (ref 70–99)

## 2021-11-26 LAB — TYPE AND SCREEN
ABO/RH(D): A POS
Antibody Screen: NEGATIVE
Unit division: 0
Unit division: 0

## 2021-11-26 LAB — BPAM RBC
Blood Product Expiration Date: 202304092359
Blood Product Expiration Date: 202304102359
ISSUE DATE / TIME: 202303132243
ISSUE DATE / TIME: 202303140215
Unit Type and Rh: 6200
Unit Type and Rh: 6200

## 2021-11-26 LAB — HEMOGLOBIN A1C
Hgb A1c MFr Bld: 6.4 % — ABNORMAL HIGH (ref 4.8–5.6)
Mean Plasma Glucose: 137 mg/dL

## 2021-11-26 LAB — CBC
HCT: 26.5 % — ABNORMAL LOW (ref 39.0–52.0)
Hemoglobin: 7.4 g/dL — ABNORMAL LOW (ref 13.0–17.0)
MCH: 22.4 pg — ABNORMAL LOW (ref 26.0–34.0)
MCHC: 27.9 g/dL — ABNORMAL LOW (ref 30.0–36.0)
MCV: 80.1 fL (ref 80.0–100.0)
Platelets: 224 10*3/uL (ref 150–400)
RBC: 3.31 MIL/uL — ABNORMAL LOW (ref 4.22–5.81)
RDW: 18.6 % — ABNORMAL HIGH (ref 11.5–15.5)
WBC: 12.1 10*3/uL — ABNORMAL HIGH (ref 4.0–10.5)
nRBC: 0 % (ref 0.0–0.2)

## 2021-11-26 LAB — MAGNESIUM: Magnesium: 2.2 mg/dL (ref 1.7–2.4)

## 2021-11-26 SURGERY — ENTEROSCOPY
Anesthesia: General

## 2021-11-26 MED ORDER — GABAPENTIN 300 MG PO CAPS
300.0000 mg | ORAL_CAPSULE | Freq: Four times a day (QID) | ORAL | Status: DC
  Administered 2021-11-26 – 2021-11-27 (×3): 300 mg via ORAL
  Filled 2021-11-26 (×3): qty 1

## 2021-11-26 MED ORDER — PREDNISONE 20 MG PO TABS
40.0000 mg | ORAL_TABLET | Freq: Every day | ORAL | Status: DC
  Administered 2021-11-27: 40 mg via ORAL
  Filled 2021-11-26: qty 2

## 2021-11-26 MED ORDER — INSULIN ASPART 100 UNIT/ML IJ SOLN
3.0000 [IU] | Freq: Three times a day (TID) | INTRAMUSCULAR | Status: DC
  Administered 2021-11-26 – 2021-11-27 (×3): 3 [IU] via SUBCUTANEOUS

## 2021-11-26 MED ORDER — METOPROLOL SUCCINATE ER 50 MG PO TB24
150.0000 mg | ORAL_TABLET | Freq: Every day | ORAL | Status: DC
Start: 2021-11-27 — End: 2021-11-27
  Administered 2021-11-27: 150 mg via ORAL
  Filled 2021-11-26 (×2): qty 3

## 2021-11-26 MED ORDER — LISINOPRIL 10 MG PO TABS
20.0000 mg | ORAL_TABLET | Freq: Every day | ORAL | Status: DC
  Administered 2021-11-27: 20 mg via ORAL
  Filled 2021-11-26: qty 2

## 2021-11-26 MED ORDER — ZOLPIDEM TARTRATE 5 MG PO TABS
5.0000 mg | ORAL_TABLET | Freq: Every evening | ORAL | Status: DC | PRN
  Administered 2021-11-26: 5 mg via ORAL
  Filled 2021-11-26: qty 1

## 2021-11-26 MED ORDER — INSULIN ASPART 100 UNIT/ML IJ SOLN: 0.0000 [IU] | Freq: Every day | INTRAMUSCULAR | Status: DC

## 2021-11-26 MED ORDER — LIDOCAINE HCL (CARDIAC) PF 100 MG/5ML IV SOSY
PREFILLED_SYRINGE | INTRAVENOUS | Status: DC | PRN
  Administered 2021-11-26: 60 mg via INTRAVENOUS

## 2021-11-26 MED ORDER — LACTATED RINGERS IV SOLN: INTRAVENOUS | Status: DC

## 2021-11-26 MED ORDER — INSULIN ASPART 100 UNIT/ML IJ SOLN
0.0000 [IU] | Freq: Three times a day (TID) | INTRAMUSCULAR | Status: DC
  Administered 2021-11-26: 8 [IU] via SUBCUTANEOUS
  Administered 2021-11-27 (×2): 3 [IU] via SUBCUTANEOUS

## 2021-11-26 MED ORDER — KETAMINE HCL 10 MG/ML IJ SOLN
INTRAMUSCULAR | Status: DC | PRN
Start: 2021-11-26 — End: 2021-11-26
  Administered 2021-11-26: 20 mg via INTRAVENOUS

## 2021-11-26 MED ORDER — GLUCAGON HCL RDNA (DIAGNOSTIC) 1 MG IJ SOLR
INTRAMUSCULAR | Status: DC | PRN
  Administered 2021-11-26: .25 mg via INTRAVENOUS

## 2021-11-26 MED ORDER — IPRATROPIUM-ALBUTEROL 0.5-2.5 (3) MG/3ML IN SOLN
3.0000 mL | Freq: Four times a day (QID) | RESPIRATORY_TRACT | Status: DC | PRN
  Administered 2021-11-27: 3 mL via RESPIRATORY_TRACT
  Filled 2021-11-26: qty 3

## 2021-11-26 MED ORDER — SODIUM CHLORIDE 0.9 % IV SOLN: INTRAVENOUS | Status: DC

## 2021-11-26 MED ORDER — MOMETASONE FURO-FORMOTEROL FUM 200-5 MCG/ACT IN AERO
2.0000 | INHALATION_SPRAY | Freq: Two times a day (BID) | RESPIRATORY_TRACT | Status: DC
  Administered 2021-11-26 – 2021-11-27 (×2): 2 via RESPIRATORY_TRACT
  Filled 2021-11-26: qty 8.8

## 2021-11-26 MED ORDER — ALUM & MAG HYDROXIDE-SIMETH 200-200-20 MG/5ML PO SUSP: 30.0000 mL | ORAL | Status: DC | PRN

## 2021-11-26 MED ORDER — PROPOFOL 10 MG/ML IV BOLUS
INTRAVENOUS | Status: AC
  Filled 2021-11-26: qty 20

## 2021-11-26 MED ORDER — KETAMINE HCL 50 MG/5ML IJ SOSY
PREFILLED_SYRINGE | INTRAMUSCULAR | Status: AC
  Filled 2021-11-26: qty 5

## 2021-11-26 MED ORDER — ATORVASTATIN CALCIUM 40 MG PO TABS
40.0000 mg | ORAL_TABLET | Freq: Every day | ORAL | Status: DC
  Administered 2021-11-26: 40 mg via ORAL
  Filled 2021-11-26: qty 1

## 2021-11-26 MED ORDER — PROPOFOL 10 MG/ML IV BOLUS
INTRAVENOUS | Status: DC | PRN
Start: 2021-11-26 — End: 2021-11-26
  Administered 2021-11-26: 50 mg via INTRAVENOUS

## 2021-11-26 MED ORDER — SPIRONOLACTONE 25 MG PO TABS
25.0000 mg | ORAL_TABLET | Freq: Every day | ORAL | Status: DC
Start: 2021-11-27 — End: 2021-11-27
  Administered 2021-11-27: 25 mg via ORAL
  Filled 2021-11-26: qty 1

## 2021-11-26 MED ORDER — PROPOFOL 500 MG/50ML IV EMUL
INTRAVENOUS | Status: DC | PRN
  Administered 2021-11-26: 125 ug/kg/min via INTRAVENOUS

## 2021-11-26 NOTE — Progress Notes (Signed)
?PROGRESS NOTE ? ? ? ?Gregory Alvarado  XTK:[id number] DOB: [birth year] DOA: 11/24/2021 ?PCP: Celene Squibb, MD ? ? ?Brief Narrative:  ?Per HPI: ?Gregory Alvarado is a 71 y.o. male with medical history significant of hypertension, hyperlipidemia, D9ME, COPD, diastolic CHF, GI bleed (gastric ulcers on EGD 10/2019), poor compliance with treatment regimen who presents emergency department after being asked to go to the ED by his PCP due to low hemoglobin.  Apparently, patient has been feeling weak and fatigued for a few months, but this worsened within the last several days, he endorsed noticing black stools within last few days.  Patient complained of shortness of breath associated with productive cough of white sputum, he complained of wheezing, but denies fever, chills, headache, nausea, vomiting, abdominal pain or diarrhea.  Patient continues to smoke cigarettes. ? ?11/25/21: Patient has been admitted with acute symptomatic blood loss anemia in the setting of GI bleed.  GI planning for upper endoscopy today.  He continues to remain on IV PPI drip. ? ?11/26/21: Patient has undergone push enteroscopy with argon plasma coagulation with multiple AVMs noted.  Plan is to continue monitoring H/H and continue diet for now.  ? ? ?Assessment & Plan: ?  ?Principal Problem: ?  GI bleed ?Active Problems: ?  COPD with acute exacerbation ([place]) ?  Symptomatic anemia ?  Acute on chronic respiratory failure with hypoxia and hypercapnia (HCC) ?  Chronic diastolic CHF (congestive heart failure) ([place]) ?  Elevated brain natriuretic peptide (BNP) level ?  Essential hypertension ?  Hyperlipidemia ?  Class 2 obesity due to excess calories with body mass index (BMI) of 38.0 to 38.9 in adult ?  Type 2 diabetes mellitus with hyperglycemia (HCC) ? ?Assessment and Plan: ? ? ?Acute symptomatic blood loss anemia secondary to GI bleed ?Hemoglobin trend improved after 2 unit PRBC transfusion ?Repeat transfusion if hemoglobin less than 7 ?Continue PPI  ?Resume  diet today after push enteroscopy and monitor H/H ?Appreciate further GI recommendations, may require repeat colonoscopy and possible capsule endoscopy if anemia is recurrent ?  ?  ?Acute on chronic respiratory failure with hypoxia and hypercapnia (HCC) ?Continue management as described for COPD exacerbation ?Continue supplemental oxygen to maintain O2 sat > 92% ?   ?  ?COPD with acute exacerbation ([place]) ?Continue duo nebs, Mucinex, Solu-Medrol, azithromycin. ?Continue Protonix to prevent steroid-induced ulcer ?Continue incentive spirometry and flutter valve ?Continue supplemental oxygen to maintain O2 sat > 92% with plan to wean patient off oxygen as tolerated ?  ?  ?Elevated brain natriuretic peptide (BNP) level ?BNP 433 (this was 487 about 2 months ago) ?Continue total input/output, daily weights and fluid restriction ?Continue home Lasix ?Continue Cardiac diet  ?Echocardiogram done on 08/28/21 showed LVEF of 55 to 60%.  LV demonstrates RWMA, LV diastolic parameters are consistent with G1 DD. ?  ?  ?Chronic diastolic CHF (congestive heart failure) ([place]) ?Echocardiogram done on 08/28/21 showed LVEF of 55 to 60%.  LV demonstrates RWMA, LV diastolic parameters are consistent with G1 DD. ?Continue home meds ?  ?  ?Type 2 diabetes mellitus with hyperglycemia (HCC) ?Continue ISS and hypoglycemic protocol ?  ?Class 2 obesity due to excess calories with body mass index (BMI) of 38.0 to 38.9 in adult ?BMI 38.9 kg/m?; diet and lifestyle modification ?  ?Hyperlipidemia ?Continue Lipitor ?  ?Essential hypertension ?Continue lisinopril, Toprol XL ?  ?  ?DVT prophylaxis: SCDs ?Code Status: Full ?Family Communication: None at bedside ?Disposition Plan:  ?Status is: Inpatient ?Remains inpatient  appropriate because: Need for close monitoring.  IV medications. ?  ?  ?Consultants:  ?GI ?  ?Procedures:  ?Push enteroscopy with APC 3/15 ?  ?Antimicrobials:  ?None ?  ?  ?Subjective: ?Patient seen and evaluated today with no new acute  complaints or concerns.  He was noted to have some dark bowel movements overnight. ? ?Objective: ?Vitals:  ? 11/26/21 1245 11/26/21 1300 11/26/21 1315 11/26/21 1358  ?BP: (!) 161/72 (!) 143/65 (!) 142/68 128/66  ?Pulse: (!) 101 (!) 108 (!) 102 98  ?Resp: 19 (!) '22 20 20  '$ ?Temp:   98.9 ?F (37.2 ?C) 98.4 ?F (36.9 ?C)  ?TempSrc:    Oral  ?SpO2: 100% 99% 100% 99%  ?Weight:      ?Height:      ? ? ?Intake/Output Summary (Last 24 hours) at 11/26/2021 1410 ?Last data filed at 11/26/2021 1216 ?Gross per 24 hour  ?Intake 440 ml  ?Output 2105 ml  ?Net -1665 ml  ? ?Filed Weights  ? 11/24/21 2258  ?Weight: 136.1 kg  ? ? ?Examination: ? ?General exam: Appears calm and comfortable  ?Respiratory system: Clear to auscultation. Respiratory effort normal. ?Cardiovascular system: S1 & S2 heard, RRR.  ?Gastrointestinal system: Abdomen is soft ?Central nervous system: Alert and awake ?Extremities: No edema ?Skin: No significant lesions noted ?Psychiatry: Flat affect. ? ? ? ?Data Reviewed: I have personally reviewed following labs and imaging studies ? ?CBC: ?Recent Labs  ?Lab 11/24/21 ?1835 11/25/21 ?4827 11/26/21 ?0458  ?WBC 10.1 10.1 12.1*  ?HGB 6.4* 7.4* 7.4*  ?HCT 25.3* 27.5* 26.5*  ?MCV 80.6 79.7* 80.1  ?PLT 265 217 224  ? ?Basic Metabolic Panel: ?Recent Labs  ?Lab 11/24/21 ?1835 11/25/21 ?0786 11/26/21 ?0458  ?NA 133*  --  131*  ?K 4.6  --  4.7  ?CL 93*  --  93*  ?CO2 32  --  30  ?GLUCOSE 188*  --  310*  ?BUN 20  --  26*  ?CREATININE 1.24  --  1.09  ?CALCIUM 8.9  --  8.8*  ?MG  --  2.1 2.2  ?PHOS  --  3.0  --   ? ?GFR: ?Estimated Creatinine Clearance: 92.7 mL/min (by C-G formula based on SCr of 1.09 mg/dL). ?Liver Function Tests: ?No results for input(s): AST, ALT, ALKPHOS, BILITOT, PROT, ALBUMIN in the last 168 hours. ?No results for input(s): LIPASE, AMYLASE in the last 168 hours. ?No results for input(s): AMMONIA in the last 168 hours. ?Coagulation Profile: ?Recent Labs  ?Lab 11/24/21 ?1903  ?INR 1.1  ? ?Cardiac Enzymes: ?No  results for input(s): CKTOTAL, CKMB, CKMBINDEX, TROPONINI in the last 168 hours. ?BNP (last 3 results) ?No results for input(s): PROBNP in the last 8760 hours. ?HbA1C: ?Recent Labs  ?  11/25/21 ?0656  ?HGBA1C 6.4*  ? ?CBG: ?Recent Labs  ?Lab 11/24/21 ?2238 11/25/21 ?1640 11/25/21 ?2139 11/26/21 ?1047 11/26/21 ?1232  ?GLUCAP 168* 257* 259* 235* 289*  ? ?Lipid Profile: ?No results for input(s): CHOL, HDL, LDLCALC, TRIG, CHOLHDL, LDLDIRECT in the last 72 hours. ?Thyroid Function Tests: ?No results for input(s): TSH, T4TOTAL, FREET4, T3FREE, THYROIDAB in the last 72 hours. ?Anemia Panel: ?No results for input(s): VITAMINB12, FOLATE, FERRITIN, TIBC, IRON, RETICCTPCT in the last 72 hours. ?Sepsis Labs: ?No results for input(s): PROCALCITON, LATICACIDVEN in the last 168 hours. ? ?Recent Results (from the past 240 hour(s))  ?Resp Panel by RT-PCR (Flu A&B, Covid) Nasopharyngeal Swab     Status: None  ? Collection Time: 11/24/21  6:43 PM  ?  Specimen: Nasopharyngeal Swab; Nasopharyngeal(NP) swabs in vial transport medium  ?Result Value Ref Range Status  ? SARS Coronavirus 2 by RT PCR NEGATIVE NEGATIVE Final  ?  Comment: (NOTE) ?SARS-CoV-2 target nucleic acids are NOT DETECTED. ? ?The SARS-CoV-2 RNA is generally detectable in upper respiratory ?specimens during the acute phase of infection. The lowest ?concentration of SARS-CoV-2 viral copies this assay can detect is ?138 copies/mL. A negative result does not preclude SARS-Cov-2 ?infection and should not be used as the sole basis for treatment or ?other patient management decisions. A negative result may occur with  ?improper specimen collection/handling, submission of specimen other ?than nasopharyngeal swab, presence of viral mutation(s) within the ?areas targeted by this assay, and inadequate number of viral ?copies(<138 copies/mL). A negative result must be combined with ?clinical observations, patient history, and epidemiological ?information. The expected result is  Negative. ? ?Fact Sheet for Patients:  ?EntrepreneurPulse.com.au ? ?Fact Sheet for Healthcare Providers:  ?IncredibleEmployment.be ? ?This test is no t yet approved or cleared by

## 2021-11-26 NOTE — Progress Notes (Signed)
Patient stated that he was having CP. Notified Camera operator and an EKG was performed. EKG resulted NSR. Maalox ordered for indigestion.  ?

## 2021-11-26 NOTE — Op Note (Signed)
[facility] ?Patient Name: Gregory Alvarado ?Procedure Date: 11/26/2021 11:39 AM ?MRN: [id number] ?Date of Birth: [birth year] ?Attending MD: Maylon Peppers ,  ?CSN: [id number] ?Age: 71 ?Admit Type: Inpatient ?Procedure:                Small bowel enteroscopy ?Indications:              Iron deficiency anemia ?Providers:                Maylon Peppers, [place], [place] Cyndi Bender  ?                          Tech, Technician ?Referring MD:              ?Medicines:                Monitored Anesthesia Care ?Complications:            No immediate complications. ?Estimated Blood Loss:     Estimated blood loss: none. ?Procedure:                Pre-Anesthesia Assessment: ?                          - Prior to the procedure, a History and Physical  ?                          was performed, and patient medications, allergies  ?                          and sensitivities were reviewed. The patient's  ?                          tolerance of previous anesthesia was reviewed. ?                          - The risks and benefits of the procedure and the  ?                          sedation options and risks were discussed with the  ?                          patient. All questions were answered and informed  ?                          consent was obtained. ?                          - ASA Grade Assessment: III - A patient with severe  ?                          systemic disease. ?                          After obtaining informed consent, the endoscope was  ?                          passed under direct vision. Throughout the  ?  procedure, the patient's blood pressure, pulse, and  ?                          oxygen saturations were monitored continuously. The  ?                          [id number]) scope was introduced through  ?                          the mouth and advanced to the proximal jejunum. The  ?                          small bowel enteroscopy was accomplished without  ?                           difficulty. The patient tolerated the procedure  ?                          well. ?Scope In: 11:48:31 AM ?Scope Out: 12:12:14 PM ?Total Procedure Duration: 0 hours 23 minutes 43 seconds  ?Findings: ?     The esophagus was normal. ?     One 5 mm angiodysplastic lesion with stigmata of recent bleeding was  ?     found in the gastric body. Coagulation for bleeding prevention using  ?     argon plasma at 0.3 liters/minute and 20 watts was successful. ?     Six angiodysplastic lesions with no bleeding were found in the entire  ?     examined duodenum. Coagulation for bleeding prevention using argon  ?     plasma at 0.3 liters/minute and 20 watts was successful. ?     A diverticulum was found in the fourth portion of the duodenum. ?     Six angiodysplastic lesions with no bleeding were found in the proximal  ?     jejunum. Coagulation for bleeding prevention using argon plasma at 0.3  ?     liters/minute and 20 watts was successful. ?Impression:               - Normal esophagus. ?                          - One recently bleeding angiodysplastic lesion in  ?                          the stomach. Treated with argon plasma coagulation  ?                          (APC). ?                          - Six non-bleeding angiodysplastic lesions in the  ?                          duodenum. Treated with argon plasma coagulation  ?                          (APC). ?                          -  Duodenal diverticulum. ?                          - Six non-bleeding angiodysplastic lesions in the  ?                          jejunum. Treated with argon plasma coagulation  ?                          (APC). ?                          - No specimens collected. ?Moderate Sedation: ?     Per Anesthesia Care ?Recommendation:           - Return patient to hospital [facility] for ongoing care. ?                          - Resume previous diet. ?                          - Check H/H daily. ?                          - Continue iron  supplementation ?                          - If recurrent anemia, can consider repeating  ?                          colonoscopy (poor prep in last) and possible  ?                          capsule endoscopy. ?Procedure Code(s):        --- Professional --- ?                          [phone], Small intestinal endoscopy, enteroscopy  ?                          beyond second portion of duodenum, not including  ?                          ileum; with control of bleeding (eg, injection,  ?                          bipolar cautery, unipolar cautery, laser, heater  ?                          probe, stapler, plasma coagulator) ?Diagnosis Code(s):        --- Professional --- ?                          N05.397, Angiodysplasia of stomach and duodenum  ?                          with bleeding ?  K55.20, Angiodysplasia of colon without hemorrhage ?                          D50.9, Iron deficiency anemia, unspecified ?CPT copyright 2019 American Medical Association. All rights reserved. ?The codes documented in this report are preliminary and upon coder review may  ?be revised to meet current compliance requirements. ?Maylon Peppers, MD ?Maylon Peppers,  ?11/26/2021 1:09:37 PM ?This report has been signed electronically. ?Number of Addenda: 0 ?

## 2021-11-26 NOTE — Anesthesia Postprocedure Evaluation (Signed)
Anesthesia Post Note ? ?Patient: Gregory Alvarado ? ?Procedure(s) Performed: ENTEROSCOPY ?HOT HEMOSTASIS (ARGON PLASMA COAGULATION/BICAP) ? ?Patient location during evaluation: Phase II ?Anesthesia Type: General ?Level of consciousness: awake ?Pain management: pain level controlled ?Vital Signs Assessment: post-procedure vital signs reviewed and stable ?Respiratory status: spontaneous breathing and respiratory function stable ?Cardiovascular status: blood pressure returned to baseline and stable ?Postop Assessment: no headache and no apparent nausea or vomiting ?Anesthetic complications: no ?Comments: Late entry ? ? ?No notable events documented. ? ? ?Last Vitals:  ?Vitals:  ? 11/26/21 1232 11/26/21 1245  ?BP: (!) 149/67 (!) 161/72  ?Pulse: (!) 109 (!) 101  ?Resp: (!) 25 19  ?Temp:    ?SpO2: 100% 100%  ?  ?Last Pain:  ?Vitals:  ? 11/26/21 1245  ?TempSrc:   ?PainSc: 0-No pain  ? ? ?  ?  ?  ?  ?  ?  ? ?Louann Sjogren ? ? ? ? ?

## 2021-11-26 NOTE — Anesthesia Preprocedure Evaluation (Addendum)
Anesthesia Evaluation  ?Patient identified by MRN, date of birth, ID band ?Patient awake ? ? ? ?Reviewed: ?Allergy & Precautions, H&P , NPO status , Patient's Chart, lab work & pertinent test results, reviewed documented beta blocker date and time  ? ?Airway ?Mallampati: II ? ?TM Distance: >3 FB ?Neck ROM: full ? ? ? Dental ?no notable dental hx. ? ?  ?Pulmonary ?COPD, Current Smoker and Patient abstained from smoking.,  ?  ?breath sounds clear to auscultation ?+ wheezing ? ? ? ? ? Cardiovascular ?Exercise Tolerance: Good ?hypertension, + CAD, + Cardiac Stents and +CHF (Diastolic)  ? ?Rhythm:regular Rate:Normal ? ? ?  ?Neuro/Psych ?negative neurological ROS ? negative psych ROS  ? GI/Hepatic ?Neg liver ROS, PUD,   ?Endo/Other  ?diabetes, Type 2Morbid obesity ? Renal/GU ?CRFRenal disease  ?negative genitourinary ?  ?Musculoskeletal ? ?(+) Arthritis ,  ? Abdominal ?  ?Peds ? Hematology ? ?(+) Blood dyscrasia, anemia ,   ?Anesthesia Other Findings ? ? Reproductive/Obstetrics ?negative OB ROS ? ?  ? ? ? ? ? ? ? ? ? ? ? ? ? ?  ?  ? ? ? ? ? ? ? ?Anesthesia Physical ?Anesthesia Plan ? ?ASA: 3 ? ?Anesthesia Plan: General  ? ?Post-op Pain Management:   ? ?Induction:  ? ?PONV Risk Score and Plan: Propofol infusion ? ?Airway Management Planned:  ? ?Additional Equipment:  ? ?Intra-op Plan:  ? ?Post-operative Plan:  ? ?Informed Consent: I have reviewed the patients History and Physical, chart, labs and discussed the procedure including the risks, benefits and alternatives for the proposed anesthesia with the patient or authorized representative who has indicated his/her understanding and acceptance.  ? ? ? ?Dental Advisory Given ? ?Plan Discussed with: CRNA ? ?Anesthesia Plan Comments:   ? ? ? ? ? ? ?Anesthesia Quick Evaluation ? ?

## 2021-11-26 NOTE — Brief Op Note (Signed)
11/24/2021 - 11/26/2021 ? ?12:46 PM ? ?PATIENT:  Gregory Alvarado  71 y.o. male ? ?PRE-OPERATIVE DIAGNOSIS:  symptomatic anemia, chornic melena, history of small bowel AVMs ? ?POST-OPERATIVE DIAGNOSIS:  duodenal AVM x6; jejunum AVM x6; gastric AVM; duodenal diverticulum;  ? ?PROCEDURE:  Procedure(s): ?ENTEROSCOPY (N/A) ?HOT HEMOSTASIS (ARGON PLASMA COAGULATION/BICAP) ? ?SURGEON:  Surgeon(s) and Role: ?   Harvel Quale, MD - Primary ? ?Patient underwent push enteroscopy under propofol sedation.  Tolerated the procedure adequately.  Esophagus was normal. One recently bleeding angiodysplastic lesion in the stomach.  Treated with argon plasma coagulation (APC). Six non-bleeding angiodysplastic lesions in the duodenum. A medium sized diverticulum was found in the fourth portion of the duodenum.  Treated with argon plasma coagulation (APC). Six non-bleeding angiodysplastic lesions in the jejunum.  Treated with argon plasma coagulation (APC).  ? ?RECOMMENDATIONS ?- Return patient to hospital [facility] for ongoing care.  ?- Resume previous diet.  ?- Check H/H daily. ?- Continue iron supplementation ?- If recurrent anemia, can consider repeating colonoscopy  (poor prep in last) and possible capsule endoscopy. ? ?Maylon Peppers, MD ?Gastroenterology and Hepatology ?[facility] ? ?

## 2021-11-26 NOTE — Transfer of Care (Signed)
Immediate Anesthesia Transfer of Care Note ? ?Patient: Gregory Alvarado ? ?Procedure(s) Performed: ENTEROSCOPY ?HOT HEMOSTASIS (ARGON PLASMA COAGULATION/BICAP) ? ?Patient Location: PACU ? ?Anesthesia Type:MAC ? ?Level of Consciousness: sedated, patient cooperative and responds to stimulation ? ?Airway & Oxygen Therapy: Patient Spontanous Breathing and Patient connected to face mask oxygen ? ?Post-op Assessment: Report given to RN and Post -op Vital signs reviewed and stable ? ?Post vital signs: Reviewed and stable ? ?Last Vitals:  ?Vitals Value Taken Time  ?BP 185/83 11/26/21 1219  ?Temp 36.7 ?C 11/26/21 1219  ?Pulse 116 11/26/21 1224  ?Resp 25 11/26/21 1224  ?SpO2 100 % 11/26/21 1224  ?Vitals shown include unvalidated device data. ? ?Last Pain:  ?Vitals:  ? 11/26/21 1143  ?TempSrc:   ?PainSc: 0-No pain  ?   ? ?Patients Stated Pain Goal: 6 (11/26/21 1027) ? ?Complications: No notable events documented. ?

## 2021-11-26 NOTE — Progress Notes (Signed)
We will proceed with push enteroscopy as scheduled.  I thoroughly discussed with the patient his procedure, including the risks involved. Patient understands what the procedure involves including the benefits and any risks. Patient understands alternatives to the proposed procedure. Risks including (but not limited to) bleeding, tearing of the lining (perforation), rupture of adjacent organs, problems with heart and lung function, infection, and medication reactions. A small percentage of complications may require surgery, hospitalization, repeat endoscopic procedure, and/or transfusion.  Patient understood and agreed.  Tandrea Kommer Castaneda, MD Gastroenterology and Hepatology [facility]  

## 2021-11-27 ENCOUNTER — Telehealth: Payer: Self-pay | Admitting: Gastroenterology

## 2021-11-27 ENCOUNTER — Other Ambulatory Visit: Payer: Self-pay | Admitting: *Deleted

## 2021-11-27 ENCOUNTER — Encounter (HOSPITAL_COMMUNITY): Payer: Self-pay | Admitting: Gastroenterology

## 2021-11-27 LAB — BASIC METABOLIC PANEL
Anion gap: 8 (ref 5–15)
BUN: 29 mg/dL — ABNORMAL HIGH (ref 8–23)
CO2: 32 mmol/L (ref 22–32)
Calcium: 8.8 mg/dL — ABNORMAL LOW (ref 8.9–10.3)
Chloride: 94 mmol/L — ABNORMAL LOW (ref 98–111)
Creatinine, Ser: 1.12 mg/dL (ref 0.61–1.24)
GFR, Estimated: >60 mL/min (ref >60)
Glucose, Bld: 154 mg/dL — ABNORMAL HIGH (ref 70–99)
Potassium: 4.3 mmol/L (ref 3.5–5.1)
Sodium: 134 mmol/L — ABNORMAL LOW (ref 135–145)

## 2021-11-27 LAB — CBC
HCT: 28 % — ABNORMAL LOW (ref 39.0–52.0)
Hemoglobin: 7.4 g/dL — ABNORMAL LOW (ref 13.0–17.0)
MCH: 21.3 pg — ABNORMAL LOW (ref 26.0–34.0)
MCHC: 26.4 g/dL — ABNORMAL LOW (ref 30.0–36.0)
MCV: 80.5 fL (ref 80.0–100.0)
Platelets: 201 10*3/uL (ref 150–400)
RBC: 3.48 MIL/uL — ABNORMAL LOW (ref 4.22–5.81)
RDW: 18.8 % — ABNORMAL HIGH (ref 11.5–15.5)
WBC: 13.9 10*3/uL — ABNORMAL HIGH (ref 4.0–10.5)
nRBC: 0 % (ref 0.0–0.2)

## 2021-11-27 LAB — GLUCOSE, CAPILLARY
Glucose-Capillary: 159 mg/dL — ABNORMAL HIGH (ref 70–99)
Glucose-Capillary: 198 mg/dL — ABNORMAL HIGH (ref 70–99)

## 2021-11-27 LAB — MAGNESIUM: Magnesium: 2.3 mg/dL (ref 1.7–2.4)

## 2021-11-27 MED ORDER — COMBIVENT RESPIMAT 20-100 MCG/ACT IN AERS
1.0000 | INHALATION_SPRAY | Freq: Four times a day (QID) | RESPIRATORY_TRACT | 1 refills | Status: DC | PRN
Start: 2021-11-27 — End: 2023-09-26

## 2021-11-27 MED ORDER — PREDNISONE 20 MG PO TABS: 40.0000 mg | ORAL_TABLET | Freq: Every day | ORAL | 0 refills | Status: AC

## 2021-11-27 NOTE — Telephone Encounter (Signed)
[facility] Clinical Pool - This is a patient of Dr. Coralee North. He has been hospitalized recently for anemia and melena. He needs repeat CBC in 1 week post discharge. ? ?Gregory Alvarado - Patient needs appointment for hospital follow up.  ?

## 2021-11-27 NOTE — Progress Notes (Signed)
IV removed and discharge instructions reviewed.  Scripts sent to pharmacy and to call Dr. Nevada Crane for follow up appointment.   ?

## 2021-11-27 NOTE — Telephone Encounter (Signed)
Noted. Lab entered into Epic  ?

## 2021-11-27 NOTE — Telephone Encounter (Signed)
Sending to courtney to arrange blood work ?

## 2021-11-27 NOTE — Addendum Note (Signed)
Addended by: Inda Castle on: 11/27/2021 10:46 AM ? ? Modules accepted: Orders ? ?

## 2021-11-27 NOTE — Progress Notes (Signed)
?  Transition of Care (TOC) Screening Note ? ? ?Patient Details  ?Name: Gregory Alvarado ?Date of Birth: [birth year] ? ? ?Transition of Care (TOC) CM/SW Contact:    ?Espn Zeman D, LCSW ?Phone Number: ?11/27/2021, 12:36 PM ? ? ? ?Transition of Care Department [facility]) has reviewed patient and no TOC needs have been identified at this time. We will continue to monitor patient advancement through interdisciplinary progression rounds. If new patient transition needs arise, please place a TOC consult. ?  ?

## 2021-11-27 NOTE — Progress Notes (Signed)
? ?Gastroenterology Progress Note  ? ?Referring Provider: No ref. provider found ?Primary Care Physician:  Celene Squibb, MD ?Primary Gastroenterologist:  Dr. Marland Kitchen ?Patient ID: Gregory Alvarado; [id number]; [birth year]  ? ? ?Subjective  ? ?Patient reports he is feeling well today.  He denies any abdominal pain, nausea/vomiting, dysphagia hematochezia, or episodes of melena.  He is tolerating his diet just fine, no decrease in appetite.  He reported that he understands what was found during his procedure yesterday and denies having any questions. ? ? ?Objective  ? ?Vital signs in last 24 hours ?Temp:  [98.1 ?F (36.7 ?C)-98.9 ?F (37.2 ?C)] 98.2 ?F (36.8 ?C) (03/16 1607) ?Pulse Rate:  [91-119] 97 (03/16 3710) ?Resp:  [18-26] 18 (03/16 6269) ?BP: (94-185)/(36-83) 130/56 (03/16 4854) ?SpO2:  [96 %-100 %] 96 % (03/16 6270) ?Last BM Date : 11/25/21 ? ?Physical Exam ?General:   Alert and oriented, pleasant ?Head:  Normocephalic and atraumatic. ?Eyes:  No icterus, sclera clear. Conjuctiva pink.  ?Heart:  S1, S2 present, no murmurs noted.  ?Lungs: Inspiratory and expiratory wheezing noted throughout.  No rales, or rhonchi.  ?Abdomen:  Bowel sounds present, soft, non-distended, mild TTP in epigastric region. No HSM or hernias noted. No rebound or guarding. No masses appreciated  ?Msk:  Symmetrical without gross deformities. Normal posture. ?Pulses:  Normal pulses noted. ?Extremities: +1 bilateral pitting pretibial and ankle edema ?Neurologic:  Alert and  oriented x4;  grossly normal neurologically. ?Skin:  Warm and dry, intact without significant lesions.  ?Psych:  Alert and cooperative. Normal mood and affect. ? ?Intake/Output from previous day: ?03/15 0701 - 03/16 0700 ?In: 440 [P.O.:240; I.V.:200] ?Out: 2755 [Urine:2750; Blood:5] ?Intake/Output this shift: ?No intake/output data recorded. ? ?Lab Results ? ?Recent Labs  ?  11/25/21 ?0656 11/26/21 ?0458 11/27/21 ?3500  ?WBC 10.1 12.1* 13.9*  ?HGB 7.4* 7.4* 7.4*  ?HCT 27.5* 26.5* 28.0*   ?PLT 217 224 201  ? ?BMET ?Recent Labs  ?  11/24/21 ?[place] 11/26/21 ?0458 11/27/21 ?9381  ?NA 133* 131* 134*  ?K 4.6 4.7 4.3  ?CL 93* 93* 94*  ?CO2 32 30 32  ?GLUCOSE 188* 310* 154*  ?BUN 20 26* 29*  ?CREATININE 1.24 1.09 1.12  ?CALCIUM 8.9 8.8* 8.8*  ? ?LFT ?No results for input(s): PROT, ALBUMIN, AST, ALT, ALKPHOS, BILITOT, BILIDIR, IBILI in the last 72 hours. ?PT/INR ?Recent Labs  ?  11/24/21 ?1903  ?LABPROT 13.8  ?INR 1.1  ? ?Hepatitis Panel ?No results for input(s): HEPBSAG, HCVAB, HEPAIGM, HEPBIGM in the last 72 hours. ? ? ?Studies/Results ?DG Chest Port 1 View ? ?Result Date: 11/24/2021 ?CLINICAL DATA:  Shortness of breath EXAM: PORTABLE CHEST 1 VIEW COMPARISON:  08/28/2021 FINDINGS: Cardiomegaly. Aortic atherosclerosis. No confluent airspace opacities, effusions or edema. No acute bony abnormality. IMPRESSION: No active disease. Electronically Signed   By: Rolm Baptise M.D.   On: 11/24/2021 19:04   ? ?Assessment  ?71 y.o. male with a history of HTN, HLD, type 2 diabetes, COPD, diastolic CHF, IDA (has received iron infusions in the past), chronic tobacco use, chronic GERD, GI bleed (history of small bowel AVMs), and H. Pylori s/p treatment without confirmation of eradication.  He presented to the ED at the request of his PCP due to low hemoglobin.  Hemoglobin on the chart was 9.7 on 08/29/2021.  He presented with complaints of feeling weak and fatigued for few months, that worsened over the last several days, and chronic black stools.  GI was consulted for evaluation and management of  GI bleeding. ? 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  He was previously on aspirin and Effient after cardiac stents placed in December 2021, currently only on 81 mg aspirin, he was unsure when he stopped taking the  Effient.  FOBT positive.  Presented with hemoglobin 6.4 received 2 units of PRBCs with improvement to 7.4 and has remained stable at 7.4 for the last 3 days.  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  We will continue to monitor H&H, will need repeat CBC in about a week post discharge.  Continue Protonix 40 mg twice daily.  ?Of note, last colonoscopy with poor prep, repeat needed on outpatient basis. ? ? ?Chronic GERD/history of H. Pylori: Pathology with last EGD August 2021 consistent with H. pylori infection.  He was prescribed quadruple therapy with doxycycline in place of tetracycline due to cost and stated that he completed therapy as directed.  There has been no documentation of eradication since treatment.  No specimens were taken with repeat EGD with enteroscopy in November 2021, and no samples taken on EGD performed yesterday 11/26/2021.  He has continued to take Protonix 40 mg twice daily at home denies any breakthrough symptoms of GERD.  He will need to continue this and follow-up outpatient for H. pylori eradication testing via stool. ? ? ?Plan / Recommendations  ? ?Recheck CBC in 1 week post discharge. ?Continue PPI BID ?Recommend daily iron supplementation ?Avoid NSAIDs and ETOH ?Consider repeat colonoscopy and possible capsule study if re-bleed occurs. ?Arranging hospital follow up in clinic. Consider repeat colonoscopy in the near future and documentation of H. Pylori eradication with stool testing.  ? ? ? LOS: 3 days  ? ? 11/27/2021, 8:56 AM ? ? ?Venetia Night, MSN, FNP-BC, AGACNP-BC ?[facility] ? ? ?

## 2021-11-27 NOTE — Discharge Summary (Signed)
Physician Discharge Summary  ?Gregory Alvarado DOB: [birth year] DOA: 11/24/2021 ? ?PCP: Celene Squibb, MD ? ?Admit date: 11/24/2021 ? ?Discharge date: 11/27/2021 ? ?Admitted From:Home ? ?Disposition:  Home ? ?Recommendations for Outpatient Follow-up:  ?Follow up with PCP in 1-2 weeks ?Follow-up with gastroenterology as scheduled ?Continue home PPI twice daily ?Continue prednisone for 5 more days as prescribed ?Combivent prescribed as needed for shortness of breath or wheezing ?Hold home aspirin and continue other home medications ? ?Home Health: None ? ?Equipment/Devices: Has home 3-4 L nasal cannula oxygen ? ?Discharge Condition:Stable ? ?CODE STATUS: Full ? ?Diet recommendation: Heart Healthy/carb modified ? ?Brief/Interim Summary: ?Per HPI: ?Gregory Alvarado is a 71 y.o. male with medical history significant of hypertension, hyperlipidemia, G6YQ, COPD, diastolic CHF, GI bleed (gastric ulcers on EGD 10/2019), poor compliance with treatment regimen who presents emergency department after being asked to go to the ED by his PCP due to low hemoglobin.  Apparently, patient has been feeling weak and fatigued for a few months, but this worsened within the last several days, he endorsed noticing black stools within last few days.  Patient complained of shortness of breath associated with productive cough of white sputum, he complained of wheezing, but denies fever, chills, headache, nausea, vomiting, abdominal pain or diarrhea.  Patient continues to smoke cigarettes. ?  ?11/25/21: Patient has been admitted with acute symptomatic blood loss anemia in the setting of GI bleed.  GI planning for upper endoscopy today.  He continues to remain on IV PPI drip. ?  ?11/26/21: Patient has undergone push enteroscopy with argon plasma coagulation with multiple AVMs noted.  Plan is to continue monitoring H/H and continue diet for now.  ? ?11/27/2021: Patient has stable hemoglobin levels and no further respiratory issues noted.  He is  noted to be stable from GI standpoint after discussion with GI and will remain on PPI at home as otherwise scheduled.  No other acute concerns or events noted during the course of this admission. ? ?Discharge Diagnoses:  ?Principal Problem: ?  GI bleed ?Active Problems: ?  COPD with acute exacerbation ([place]) ?  Symptomatic anemia ?  Acute on chronic respiratory failure with hypoxia and hypercapnia (HCC) ?  Chronic diastolic CHF (congestive heart failure) ([place]) ?  Elevated brain natriuretic peptide (BNP) level ?  Essential hypertension ?  Hyperlipidemia ?  Class 2 obesity due to excess calories with body mass index (BMI) of 38.0 to 38.9 in adult ?  Type 2 diabetes mellitus with hyperglycemia (H[facility]) ? ?Principal discharge diagnosis: Acute symptomatic blood loss anemia secondary to GI bleed related to AVMs status post push enteroscopy with APC with 2 unit PRBC transfusion.  Acute on chronic hypoxemic respiratory failure secondary to mild acute COPD exacerbation. ? ?Discharge Instructions ? ?Discharge Instructions   ? ? Diet - low sodium heart healthy   Complete by: As directed ?  ? Increase activity slowly   Complete by: As directed ?  ? ?  ? ?Allergies as of 11/27/2021   ? ?   Reactions  ? Caduet [amlodipine-atorvastatin] Swelling  ? ?  ? ?  ?Medication List  ?  ? ?STOP taking these medications   ? ?aspirin EC 81 MG tablet ?  ? ?  ? ?TAKE these medications   ? ?acetaminophen 325 MG tablet ?Commonly known as: TYLENOL ?Take 2 tablets (650 mg total) by mouth every 6 (six) hours as needed for mild pain (or Fever >/= 101). ?  ?atorvastatin 40 MG tablet ?  Commonly known as: LIPITOR ?Take 40 mg by mouth at bedtime. ?  ?Combivent Respimat 20-100 MCG/ACT Aers respimat ?Generic drug: Ipratropium-Albuterol ?Inhale 1 puff into the lungs every 6 (six) hours as needed for wheezing or shortness of breath. ?  ?folic acid 1 MG tablet ?Commonly known as: FOLVITE ?Take 1 tablet (1 mg total) by mouth daily. ?What changed: when to take this ?   ?furosemide 40 MG tablet ?Commonly known as: LASIX ?Take 1 tablet (40 mg total) by mouth daily. ?What changed:  ?when to take this ?reasons to take this ?  ?gabapentin 300 MG capsule ?Commonly known as: NEURONTIN ?Take 1 capsule by mouth 4 (four) times daily. ?  ?insulin NPH-regular Human (70-30) 100 UNIT/ML injection ?Inject 70 Units into the skin in the morning and at bedtime. ?  ?lisinopril 20 MG tablet ?Commonly known as: ZESTRIL ?Take 1 tablet (20 mg total) by mouth daily. ?  ?metoprolol succinate 100 MG 24 hr tablet ?Commonly known as: TOPROL-XL ?Take 1.5 tablets (150 mg total) by mouth daily. ?What changed: how much to take ?  ?nitroGLYCERIN 0.4 MG SL tablet ?Commonly known as: NITROSTAT ?DISSOLVE 1 TABLET SUBLINGUALLY AS NEEDED FOR CHEST PAIN, MAY REPEAT EVERY 5 MINUTES. AFTER 3 CALL 911. ?What changed: See the new instructions. ?  ?pantoprazole 40 MG tablet ?Commonly known as: PROTONIX ?TAKE ONE TABLET BY MOUTH 2 TIMES A DAY BEFORE A MEAL ?What changed: See the new instructions. ?  ?predniSONE 20 MG tablet ?Commonly known as: DELTASONE ?Take 2 tablets (40 mg total) by mouth daily with breakfast for 5 days. ?Start taking on: November 28, 2021 ?What changed:  ?medication strength ?how much to take ?additional instructions ?  ?spironolactone 25 MG tablet ?Commonly known as: ALDACTONE ?TAKE 1 TABLET BY MOUTH ONCE A DAY. ?  ?Symbicort 160-4.5 MCG/ACT inhaler ?Generic drug: budesonide-formoterol ?Inhale 2 puffs into the lungs 2 (two) times daily. ?  ?traMADol 50 MG tablet ?Commonly known as: ULTRAM ?Take 50-100 mg by mouth in the morning and at bedtime. ?  ?zolpidem 5 MG tablet ?Commonly known as: AMBIEN ?Take 5 mg by mouth at bedtime as needed for sleep. ?  ? ?  ? ? Follow-up Information   ? ? Celene Squibb, MD. Schedule an appointment as soon as possible for a visit in 1 week(s).   ?Specialty: Internal Medicine ?Contact information: ?[address] ?[place] ?[place] [address] ?[phone] ? ? ?  ?  ? ? [facility]. Go to.   ?Contact information: ?[address] ?[place] ?[phone] ? ?  ?  ? ?  ?  ? ?  ? ?Allergies  ?Allergen Reactions  ? Caduet [Amlodipine-Atorvastatin] Swelling  ? ? ?Consultations: ?GI ? ? ?Procedures/Studies: ?DG Chest Port 1 View ? ?Result Date: 11/24/2021 ?CLINICAL DATA:  Shortness of breath EXAM: PORTABLE CHEST 1 VIEW COMPARISON:  08/28/2021 FINDINGS: Cardiomegaly. Aortic atherosclerosis. No confluent airspace opacities, effusions or edema. No acute bony abnormality. IMPRESSION: No active disease. Electronically Signed   By: Rolm Baptise M.D.   On: 11/24/2021 19:04   ? ? ?Discharge Exam: ?Vitals:  ? 11/27/21 0855 11/27/21 0859  ?BP:    ?Pulse:    ?Resp:    ?Temp:    ?SpO2: 97% 97%  ? ?Vitals:  ? 11/26/21 2157 11/27/21 5397 11/27/21 0855 11/27/21 0859  ?BP: (!) 95/36 (!) 130/56    ?Pulse: 94 97    ?Resp:  18    ?Temp:  98.2 ?F (36.8 ?C)    ?  TempSrc:  Oral    ?SpO2: 100% 96% 97% 97%  ?Weight:      ?Height:      ? ? ?General: Pt is alert, awake, not in acute distress ?Cardiovascular: RRR, S1/S2 +, no rubs, no gallops ?Respiratory: CTA bilaterally, no wheezing, no rhonchi, on 4 L nasal cannula oxygen ?Abdominal: Soft, NT, ND, bowel sounds + ?Extremities: no edema, no cyanosis ? ? ? ?The results of significant diagnostics from this hospitalization (including imaging, microbiology, ancillary and laboratory) are listed below for reference.   ? ? ?Microbiology: ?Recent Results (from the past 240 hour(s))  ?Resp Panel by RT-PCR (Flu A&B, Covid) Nasopharyngeal Swab     Status: None  ? Collection Time: 11/24/21  6:43 PM  ? Specimen: Nasopharyngeal Swab; Nasopharyngeal(NP) swabs in vial transport medium  ?Result Value Ref Range Status  ? SARS Coronavirus 2 by RT PCR NEGATIVE NEGATIVE Final  ?  Comment: (NOTE) ?SARS-CoV-2 target nucleic acids are NOT DETECTED. ? ?The SARS-CoV-2 RNA is generally detectable in upper respiratory ?specimens during the acute phase  of infection. The lowest ?concentration of SARS-CoV-2 viral copies this assay can detect is ?138 copies/mL. A negative result does not preclude SARS-Cov-2 ?infection and should not be used as the sole ba

## 2021-11-27 NOTE — Discharge Instructions (Signed)
Follow up with PCP in 1-2 weeks ?Follow-up with gastroenterology as scheduled ?Continue home PPI twice daily ?Continue prednisone for 5 more days as prescribed ?Combivent prescribed as needed for shortness of breath or wheezing ?Hold home aspirin and continue other home medications ?

## 2021-12-04 DIAGNOSIS — J449 Chronic obstructive pulmonary disease, unspecified: Secondary | ICD-10-CM | POA: Diagnosis not present

## 2021-12-16 ENCOUNTER — Ambulatory Visit: Payer: Medicare Other | Admitting: Internal Medicine

## 2021-12-16 ENCOUNTER — Encounter: Payer: Self-pay | Admitting: Internal Medicine

## 2021-12-24 ENCOUNTER — Other Ambulatory Visit: Payer: Self-pay | Admitting: Cardiology

## 2021-12-31 IMAGING — DX DG CHEST 1V PORT
1 series · 1 of 1 positions shown · non-contrast
Comparison: 01/06/2021

CLINICAL DATA: Shortness of breath.

EXAM:
PORTABLE CHEST 1 VIEW

[chest ap]
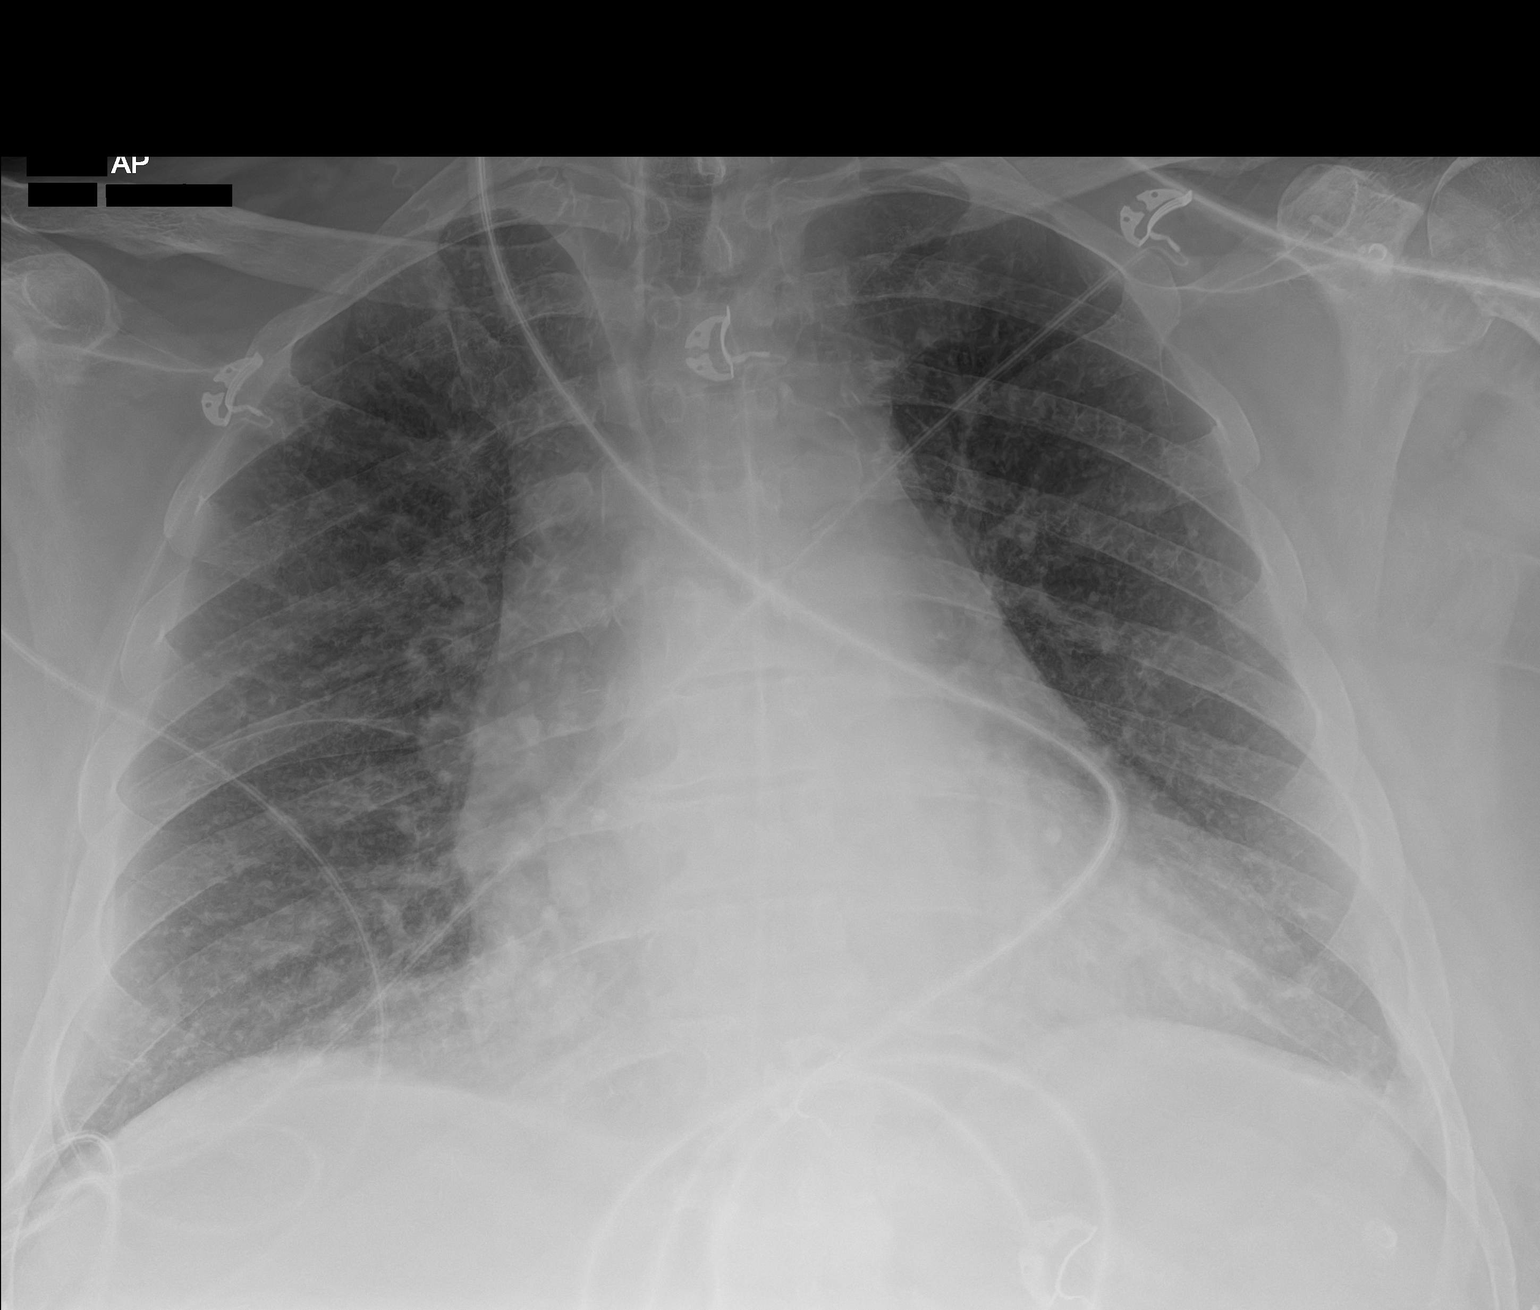

[1 of 1 positions shown; findings below may reference images not displayed]

FINDINGS: 3830 hours. The lungs are clear without focal pneumonia, edema,
pneumothorax or pleural effusion. Interstitial markings are
diffusely coarsened with chronic features. The cardio pericardial
silhouette is enlarged. The visualized bony structures of the thorax
show no acute abnormality. Telemetry leads overlie the chest.
IMPRESSION: No acute cardiopulmonary findings.

Chronic interstitial coarsening.

## 2022-01-04 DIAGNOSIS — J449 Chronic obstructive pulmonary disease, unspecified: Secondary | ICD-10-CM | POA: Diagnosis not present

## 2022-01-12 ENCOUNTER — Other Ambulatory Visit: Payer: Self-pay | Admitting: Cardiology

## 2022-01-19 ENCOUNTER — Encounter (HOSPITAL_COMMUNITY): Payer: Self-pay | Admitting: Hematology

## 2022-01-29 ENCOUNTER — Other Ambulatory Visit: Payer: Self-pay | Admitting: Cardiology

## 2022-02-03 DIAGNOSIS — J449 Chronic obstructive pulmonary disease, unspecified: Secondary | ICD-10-CM | POA: Diagnosis not present

## 2022-02-10 ENCOUNTER — Other Ambulatory Visit: Payer: Self-pay | Admitting: Cardiology

## 2022-02-11 DIAGNOSIS — E1169 Type 2 diabetes mellitus with other specified complication: Secondary | ICD-10-CM | POA: Diagnosis not present

## 2022-02-11 DIAGNOSIS — I5022 Chronic systolic (congestive) heart failure: Secondary | ICD-10-CM | POA: Diagnosis not present

## 2022-02-11 DIAGNOSIS — E782 Mixed hyperlipidemia: Secondary | ICD-10-CM | POA: Diagnosis not present

## 2022-02-11 DIAGNOSIS — I10 Essential (primary) hypertension: Secondary | ICD-10-CM | POA: Diagnosis not present

## 2022-02-13 ENCOUNTER — Other Ambulatory Visit: Payer: Self-pay | Admitting: Cardiology

## 2022-02-27 ENCOUNTER — Other Ambulatory Visit: Payer: Self-pay | Admitting: Cardiology

## 2022-03-06 DIAGNOSIS — J449 Chronic obstructive pulmonary disease, unspecified: Secondary | ICD-10-CM | POA: Diagnosis not present

## 2022-03-12 ENCOUNTER — Inpatient Hospital Stay (HOSPITAL_COMMUNITY)
Admission: EM | Admit: 2022-03-12 | Discharge: 2022-03-15 | DRG: 377 | Disposition: A | Discharge status: Home / Self Care | Payer: Medicare Other | Attending: Internal Medicine | Admitting: Internal Medicine

## 2022-03-12 ENCOUNTER — Inpatient Hospital Stay (HOSPITAL_COMMUNITY): Payer: Medicare Other

## 2022-03-12 ENCOUNTER — Encounter (HOSPITAL_COMMUNITY): Payer: Self-pay | Admitting: Emergency Medicine

## 2022-03-12 ENCOUNTER — Other Ambulatory Visit: Payer: Self-pay

## 2022-03-12 ENCOUNTER — Emergency Department (HOSPITAL_COMMUNITY): Payer: Medicare Other

## 2022-03-12 DIAGNOSIS — K5521 Angiodysplasia of colon with hemorrhage: Secondary | ICD-10-CM

## 2022-03-12 DIAGNOSIS — I2510 Atherosclerotic heart disease of native coronary artery without angina pectoris: Secondary | ICD-10-CM | POA: Diagnosis not present

## 2022-03-12 DIAGNOSIS — R55 Syncope and collapse: Secondary | ICD-10-CM

## 2022-03-12 DIAGNOSIS — I10 Essential (primary) hypertension: Secondary | ICD-10-CM | POA: Diagnosis present

## 2022-03-12 DIAGNOSIS — Z833 Family history of diabetes mellitus: Secondary | ICD-10-CM

## 2022-03-12 DIAGNOSIS — J449 Chronic obstructive pulmonary disease, unspecified: Secondary | ICD-10-CM

## 2022-03-12 DIAGNOSIS — E1165 Type 2 diabetes mellitus with hyperglycemia: Secondary | ICD-10-CM

## 2022-03-12 DIAGNOSIS — K31819 Angiodysplasia of stomach and duodenum without bleeding: Secondary | ICD-10-CM | POA: Diagnosis not present

## 2022-03-12 DIAGNOSIS — E11649 Type 2 diabetes mellitus with hypoglycemia without coma: Secondary | ICD-10-CM | POA: Diagnosis not present

## 2022-03-12 DIAGNOSIS — J9612 Chronic respiratory failure with hypercapnia: Secondary | ICD-10-CM | POA: Diagnosis not present

## 2022-03-12 DIAGNOSIS — Z9981 Dependence on supplemental oxygen: Secondary | ICD-10-CM

## 2022-03-12 DIAGNOSIS — G4700 Insomnia, unspecified: Secondary | ICD-10-CM | POA: Diagnosis not present

## 2022-03-12 DIAGNOSIS — E782 Mixed hyperlipidemia: Secondary | ICD-10-CM | POA: Diagnosis present

## 2022-03-12 DIAGNOSIS — Z720 Tobacco use: Secondary | ICD-10-CM | POA: Diagnosis present

## 2022-03-12 DIAGNOSIS — E119 Type 2 diabetes mellitus without complications: Secondary | ICD-10-CM | POA: Diagnosis not present

## 2022-03-12 DIAGNOSIS — Q2739 Arteriovenous malformation, other site: Secondary | ICD-10-CM

## 2022-03-12 DIAGNOSIS — I252 Old myocardial infarction: Secondary | ICD-10-CM

## 2022-03-12 DIAGNOSIS — D649 Anemia, unspecified: Secondary | ICD-10-CM

## 2022-03-12 DIAGNOSIS — Z7951 Long term (current) use of inhaled steroids: Secondary | ICD-10-CM | POA: Diagnosis not present

## 2022-03-12 DIAGNOSIS — Z79899 Other long term (current) drug therapy: Secondary | ICD-10-CM

## 2022-03-12 DIAGNOSIS — D62 Acute posthemorrhagic anemia: Secondary | ICD-10-CM | POA: Diagnosis present

## 2022-03-12 DIAGNOSIS — Z8249 Family history of ischemic heart disease and other diseases of the circulatory system: Secondary | ICD-10-CM

## 2022-03-12 DIAGNOSIS — J9611 Chronic respiratory failure with hypoxia: Secondary | ICD-10-CM | POA: Diagnosis not present

## 2022-03-12 DIAGNOSIS — R0602 Shortness of breath: Secondary | ICD-10-CM | POA: Diagnosis not present

## 2022-03-12 DIAGNOSIS — Z6841 Body Mass Index (BMI) 40.0 and over, adult: Secondary | ICD-10-CM | POA: Diagnosis not present

## 2022-03-12 DIAGNOSIS — Z794 Long term (current) use of insulin: Secondary | ICD-10-CM

## 2022-03-12 DIAGNOSIS — I110 Hypertensive heart disease with heart failure: Secondary | ICD-10-CM | POA: Diagnosis not present

## 2022-03-12 DIAGNOSIS — Z955 Presence of coronary angioplasty implant and graft: Secondary | ICD-10-CM

## 2022-03-12 DIAGNOSIS — E785 Hyperlipidemia, unspecified: Secondary | ICD-10-CM | POA: Diagnosis present

## 2022-03-12 DIAGNOSIS — Z888 Allergy status to other drugs, medicaments and biological substances status: Secondary | ICD-10-CM

## 2022-03-12 DIAGNOSIS — D72829 Elevated white blood cell count, unspecified: Secondary | ICD-10-CM | POA: Diagnosis present

## 2022-03-12 DIAGNOSIS — E161 Other hypoglycemia: Secondary | ICD-10-CM | POA: Diagnosis not present

## 2022-03-12 DIAGNOSIS — E162 Hypoglycemia, unspecified: Secondary | ICD-10-CM | POA: Diagnosis not present

## 2022-03-12 DIAGNOSIS — I6782 Cerebral ischemia: Secondary | ICD-10-CM | POA: Diagnosis not present

## 2022-03-12 DIAGNOSIS — K31811 Angiodysplasia of stomach and duodenum with bleeding: Principal | ICD-10-CM | POA: Diagnosis present

## 2022-03-12 DIAGNOSIS — K5520 Angiodysplasia of colon without hemorrhage: Secondary | ICD-10-CM | POA: Diagnosis not present

## 2022-03-12 DIAGNOSIS — I5043 Acute on chronic combined systolic (congestive) and diastolic (congestive) heart failure: Secondary | ICD-10-CM | POA: Diagnosis not present

## 2022-03-12 DIAGNOSIS — Z9049 Acquired absence of other specified parts of digestive tract: Secondary | ICD-10-CM

## 2022-03-12 DIAGNOSIS — K625 Hemorrhage of anus and rectum: Secondary | ICD-10-CM

## 2022-03-12 DIAGNOSIS — F17200 Nicotine dependence, unspecified, uncomplicated: Secondary | ICD-10-CM | POA: Diagnosis present

## 2022-03-12 DIAGNOSIS — G9341 Metabolic encephalopathy: Secondary | ICD-10-CM | POA: Diagnosis not present

## 2022-03-12 DIAGNOSIS — Z79891 Long term (current) use of opiate analgesic: Secondary | ICD-10-CM

## 2022-03-12 DIAGNOSIS — Z8619 Personal history of other infectious and parasitic diseases: Secondary | ICD-10-CM

## 2022-03-12 DIAGNOSIS — M1990 Unspecified osteoarthritis, unspecified site: Secondary | ICD-10-CM | POA: Diagnosis present

## 2022-03-12 DIAGNOSIS — R0689 Other abnormalities of breathing: Secondary | ICD-10-CM | POA: Diagnosis not present

## 2022-03-12 DIAGNOSIS — F17210 Nicotine dependence, cigarettes, uncomplicated: Secondary | ICD-10-CM | POA: Diagnosis present

## 2022-03-12 DIAGNOSIS — I998 Other disorder of circulatory system: Secondary | ICD-10-CM

## 2022-03-12 DIAGNOSIS — R4020 Unspecified coma: Secondary | ICD-10-CM | POA: Diagnosis not present

## 2022-03-12 DIAGNOSIS — Z716 Tobacco abuse counseling: Secondary | ICD-10-CM

## 2022-03-12 LAB — BLOOD GAS, ARTERIAL
Acid-Base Excess: 11.9 mmol/L — ABNORMAL HIGH (ref 0.0–2.0)
Bicarbonate: 38.4 mmol/L — ABNORMAL HIGH (ref 20.0–28.0)
Drawn by: 41977
O2 Saturation: 96.5 %
Patient temperature: 37
pCO2 arterial: 68 mmHg (ref 32–48)
pH, Arterial: 7.36 (ref 7.35–7.45)
pO2, Arterial: 70 mmHg — ABNORMAL LOW (ref 83–108)

## 2022-03-12 LAB — COMPREHENSIVE METABOLIC PANEL
ALT: 8 U/L (ref 0–44)
AST: 13 U/L — ABNORMAL LOW (ref 15–41)
Albumin: 3.3 g/dL — ABNORMAL LOW (ref 3.5–5.0)
Alkaline Phosphatase: 76 U/L (ref 38–126)
Anion gap: 6 (ref 5–15)
BUN: 15 mg/dL (ref 8–23)
CO2: 32 mmol/L (ref 22–32)
Calcium: 8.7 mg/dL — ABNORMAL LOW (ref 8.9–10.3)
Chloride: 98 mmol/L (ref 98–111)
Creatinine, Ser: 1.23 mg/dL (ref 0.61–1.24)
GFR, Estimated: >60 mL/min (ref >60)
Glucose, Bld: 91 mg/dL (ref 70–99)
Potassium: 4.9 mmol/L (ref 3.5–5.1)
Sodium: 136 mmol/L (ref 135–145)
Total Bilirubin: 0.5 mg/dL (ref 0.3–1.2)
Total Protein: 6.4 g/dL — ABNORMAL LOW (ref 6.5–8.1)

## 2022-03-12 LAB — PREPARE RBC (CROSSMATCH)

## 2022-03-12 LAB — CBC WITH DIFFERENTIAL/PLATELET
Abs Immature Granulocytes: 0.05 10*3/uL (ref 0.00–0.07)
Basophils Absolute: 0 10*3/uL (ref 0.0–0.1)
Basophils Relative: 0 %
Eosinophils Absolute: 0 10*3/uL (ref 0.0–0.5)
Eosinophils Relative: 0 %
HCT: 22.8 % — ABNORMAL LOW (ref 39.0–52.0)
Hemoglobin: 5.7 g/dL — CL (ref 13.0–17.0)
Immature Granulocytes: 1 %
Lymphocytes Relative: 7 %
Lymphs Abs: 0.7 10*3/uL (ref 0.7–4.0)
MCH: 19.1 pg — ABNORMAL LOW (ref 26.0–34.0)
MCHC: 25 g/dL — ABNORMAL LOW (ref 30.0–36.0)
MCV: 76.5 fL — ABNORMAL LOW (ref 80.0–100.0)
Monocytes Absolute: 0.5 10*3/uL (ref 0.1–1.0)
Monocytes Relative: 6 %
Neutro Abs: 8.2 10*3/uL — ABNORMAL HIGH (ref 1.7–7.7)
Neutrophils Relative %: 86 %
Platelets: 205 10*3/uL (ref 150–400)
RBC: 2.98 MIL/uL — ABNORMAL LOW (ref 4.22–5.81)
RDW: 18.5 % — ABNORMAL HIGH (ref 11.5–15.5)
WBC: 9.6 10*3/uL (ref 4.0–10.5)
nRBC: 0 % (ref 0.0–0.2)

## 2022-03-12 LAB — TROPONIN I (HIGH SENSITIVITY)
Troponin I (High Sensitivity): 93 ng/L — ABNORMAL HIGH (ref <18)
Troponin I (High Sensitivity): 231 ng/L (ref <18)

## 2022-03-12 LAB — BRAIN NATRIURETIC PEPTIDE: B Natriuretic Peptide: 430 pg/mL — ABNORMAL HIGH (ref 0.0–100.0)

## 2022-03-12 LAB — GLUCOSE, CAPILLARY: Glucose-Capillary: 116 mg/dL — ABNORMAL HIGH (ref 70–99)

## 2022-03-12 LAB — CBG MONITORING, ED: Glucose-Capillary: 76 mg/dL (ref 70–99)

## 2022-03-12 MED ORDER — METHYLPREDNISOLONE SODIUM SUCC 125 MG IJ SOLR
125.0000 mg | Freq: Once | INTRAMUSCULAR | Status: AC
  Administered 2022-03-12: 125 mg via INTRAVENOUS
  Filled 2022-03-12: qty 2

## 2022-03-12 MED ORDER — FUROSEMIDE 10 MG/ML IJ SOLN: 40.0000 mg | Freq: Once | INTRAMUSCULAR | Status: DC

## 2022-03-12 MED ORDER — ALBUTEROL SULFATE (2.5 MG/3ML) 0.083% IN NEBU
2.5000 mg | INHALATION_SOLUTION | Freq: Once | RESPIRATORY_TRACT | Status: AC
  Administered 2022-03-12: 2.5 mg via RESPIRATORY_TRACT
  Filled 2022-03-12: qty 3

## 2022-03-12 MED ORDER — INSULIN ASPART 100 UNIT/ML IJ SOLN
0.0000 [IU] | Freq: Four times a day (QID) | INTRAMUSCULAR | Status: DC
  Administered 2022-03-13: 2 [IU] via SUBCUTANEOUS
  Administered 2022-03-13: 3 [IU] via SUBCUTANEOUS
  Administered 2022-03-13: 2 [IU] via SUBCUTANEOUS
  Administered 2022-03-13: 3 [IU] via SUBCUTANEOUS
  Administered 2022-03-14: 7 [IU] via SUBCUTANEOUS
  Administered 2022-03-14: 3 [IU] via SUBCUTANEOUS

## 2022-03-12 MED ORDER — POLYETHYLENE GLYCOL 3350 17 G PO PACK
17.0000 g | PACK | Freq: Every day | ORAL | Status: DC | PRN
Start: 2022-03-12 — End: 2022-03-15

## 2022-03-12 MED ORDER — IPRATROPIUM-ALBUTEROL 0.5-2.5 (3) MG/3ML IN SOLN
3.0000 mL | Freq: Four times a day (QID) | RESPIRATORY_TRACT | Status: DC
Start: 2022-03-12 — End: 2022-03-13
  Administered 2022-03-12: 3 mL via RESPIRATORY_TRACT
  Filled 2022-03-12: qty 3

## 2022-03-12 MED ORDER — IPRATROPIUM-ALBUTEROL 0.5-2.5 (3) MG/3ML IN SOLN
3.0000 mL | Freq: Once | RESPIRATORY_TRACT | Status: AC
  Administered 2022-03-12: 3 mL via RESPIRATORY_TRACT
  Filled 2022-03-12: qty 3

## 2022-03-12 MED ORDER — IPRATROPIUM-ALBUTEROL 0.5-2.5 (3) MG/3ML IN SOLN: 3.0000 mL | RESPIRATORY_TRACT | Status: DC | PRN

## 2022-03-12 MED ORDER — ONDANSETRON HCL 4 MG/2ML IJ SOLN: 4.0000 mg | Freq: Four times a day (QID) | INTRAMUSCULAR | Status: DC | PRN

## 2022-03-12 MED ORDER — FUROSEMIDE 10 MG/ML IJ SOLN
40.0000 mg | Freq: Two times a day (BID) | INTRAMUSCULAR | Status: DC
  Administered 2022-03-13: 40 mg via INTRAVENOUS
  Filled 2022-03-12: qty 4

## 2022-03-12 MED ORDER — SODIUM CHLORIDE 0.9% IV SOLUTION: Freq: Once | INTRAVENOUS | Status: AC

## 2022-03-12 MED ORDER — ONDANSETRON HCL 4 MG PO TABS: 4.0000 mg | ORAL_TABLET | Freq: Four times a day (QID) | ORAL | Status: DC | PRN

## 2022-03-12 MED ORDER — ACETAMINOPHEN 325 MG PO TABS
650.0000 mg | ORAL_TABLET | Freq: Four times a day (QID) | ORAL | Status: DC | PRN
  Administered 2022-03-14: 650 mg via ORAL

## 2022-03-12 MED ORDER — FUROSEMIDE 10 MG/ML IJ SOLN
40.0000 mg | Freq: Once | INTRAMUSCULAR | Status: AC
  Administered 2022-03-12: 40 mg via INTRAVENOUS
  Filled 2022-03-12: qty 4

## 2022-03-12 MED ORDER — PANTOPRAZOLE SODIUM 40 MG IV SOLR
40.0000 mg | Freq: Two times a day (BID) | INTRAVENOUS | Status: DC
  Administered 2022-03-12 – 2022-03-15 (×6): 40 mg via INTRAVENOUS
  Filled 2022-03-12 (×6): qty 10

## 2022-03-12 MED ORDER — MAGNESIUM SULFATE 2 GM/50ML IV SOLN
2.0000 g | Freq: Once | INTRAVENOUS | Status: AC
  Administered 2022-03-12: 2 g via INTRAVENOUS
  Filled 2022-03-12: qty 50

## 2022-03-12 MED ORDER — ACETAMINOPHEN 650 MG RE SUPP: 650.0000 mg | Freq: Four times a day (QID) | RECTAL | Status: DC | PRN

## 2022-03-12 NOTE — ED Provider Notes (Signed)
[facility] EMERGENCY DEPARTMENT Provider Note   CSN: [id number] Arrival date & time: 03/12/22  1255     History  Chief Complaint  Patient presents with   Altered Mental Status    Gregory Alvarado is a 71 y.o. male.  Patient was found by his son passed out.  Patient has severe COPD and heart failure along with diabetes.  His sugar was 60 at the time and he was given a little sugar and he improved.  Patient states that he passes out occasionally  The history is provided by the patient and medical records. No language interpreter was used.  Altered Mental Status Presenting symptoms: lethargy   Severity:  Moderate Most recent episode:  Today Episode history:  Single Timing:  Intermittent Progression:  Waxing and waning Chronicity:  New Context: not alcohol use        Home Medications Prior to Admission medications   Medication Sig Start Date End Date Taking? Authorizing Provider  acetaminophen (TYLENOL) 325 MG tablet Take 2 tablets (650 mg total) by mouth every 6 (six) hours as needed for mild pain (or Fever >/= 101). 10/29/19   Roxan Hockey, MD  atorvastatin (LIPITOR) 40 MG tablet Take 40 mg by mouth at bedtime. 08/30/19   [provider]  folic acid (FOLVITE) 1 MG tablet Take 1 tablet (1 mg total) by mouth daily. Patient taking differently: Take 1 mg by mouth at bedtime. 10/30/19   Roxan Hockey, MD  furosemide (LASIX) 40 MG tablet Take 1 tablet (40 mg total) by mouth daily. Patient taking differently: Take 40 mg by mouth daily as needed for fluid or edema. 08/31/21   Orson Eva, MD  gabapentin (NEURONTIN) 300 MG capsule Take 1 capsule by mouth 4 (four) times daily. 12/30/20   [provider]  insulin NPH-regular Human (NOVOLIN 70/30) (70-30) 100 UNIT/ML injection Inject 70 Units into the skin in the morning and at bedtime.    [provider]  Ipratropium-Albuterol (COMBIVENT RESPIMAT) 20-100 MCG/ACT AERS respimat Inhale 1 puff into the lungs  every 6 (six) hours as needed for wheezing or shortness of breath. 11/27/21   Manuella Ghazi, Pratik D, DO  lisinopril (PRINIVIL,ZESTRIL) 20 MG tablet Take 1 tablet (20 mg total) by mouth daily. 06/09/17   Cassandria Anger, MD  metoprolol succinate (TOPROL-XL) 100 MG 24 hr tablet TAKE 1 AND 1/2 TABLETS BY MOUTH ONCE A DAY. 02/13/22   Arnoldo Lenis, MD  nitroGLYCERIN (NITROSTAT) 0.4 MG SL tablet DISSOLVE 1 TABLET SUBLINGUALLY AS NEEDED FOR CHEST PAIN, MAY REPEAT EVERY 5 MINUTES. AFTER 3 CALL 911. 02/27/22   Arnoldo Lenis, MD  pantoprazole (PROTONIX) 40 MG tablet TAKE ONE TABLET BY MOUTH 2 TIMES A DAY BEFORE A MEAL Patient taking differently: Take 40 mg by mouth 2 (two) times daily. 11/18/21   Mahala Menghini, PA-C  spironolactone (ALDACTONE) 25 MG tablet TAKE 1 TABLET BY MOUTH ONCE A DAY. 02/10/22   Arnoldo Lenis, MD  SYMBICORT 160-4.5 MCG/ACT inhaler Inhale 2 puffs into the lungs 2 (two) times daily. 09/24/20   [provider]  traMADol (ULTRAM) 50 MG tablet Take 50-100 mg by mouth in the morning and at bedtime. 09/20/19   [provider]  zolpidem (AMBIEN) 5 MG tablet Take 5 mg by mouth at bedtime as needed for sleep. 09/26/20   [provider]      Allergies    Caduet [amlodipine-atorvastatin]    Review of Systems   Review of Systems  Unable to  perform ROS: Mental status change    Physical Exam Updated Vital Signs BP (!) 131/56   Pulse 89   Temp 98.6 F (37 C)   Resp (!) 21   Ht 6' (1.829 m)   Wt 135 kg   SpO2 100%   BMI 40.36 kg/m  Physical Exam Vitals and nursing note reviewed.  Constitutional:      Appearance: He is well-developed.  HENT:     Head: Normocephalic.     Nose: Nose normal.  Eyes:     General: No scleral icterus.    Conjunctiva/sclera: Conjunctivae normal.  Neck:     Thyroid: No thyromegaly.  Cardiovascular:     Rate and Rhythm: Normal rate and regular rhythm.     Heart sounds: No murmur heard.    No friction rub. No gallop.   Pulmonary:     Breath sounds: No stridor. No wheezing or rales.  Chest:     Chest wall: No tenderness.  Abdominal:     General: There is no distension.     Tenderness: There is no abdominal tenderness. There is no rebound.  Musculoskeletal:        General: Normal range of motion.     Cervical back: Neck supple.  Lymphadenopathy:     Cervical: No cervical adenopathy.  Skin:    Findings: No erythema or rash.  Neurological:     Mental Status: He is alert.     Motor: No abnormal muscle tone.     Coordination: Coordination normal.  Psychiatric:        Behavior: Behavior normal.     ED Results / Procedures / Treatments   Labs (all labs ordered are listed, but only abnormal results are displayed) Labs Reviewed  COMPREHENSIVE METABOLIC PANEL - Abnormal; Notable for the following components:      Result Value   Calcium 8.7 (*)    Total Protein 6.4 (*)    Albumin 3.3 (*)    AST 13 (*)    All other components within normal limits  BLOOD GAS, ARTERIAL - Abnormal; Notable for the following components:   pCO2 arterial 68 (*)    pO2, Arterial 70 (*)    Bicarbonate 38.4 (*)    Acid-Base Excess 11.9 (*)    All other components within normal limits  BRAIN NATRIURETIC PEPTIDE - Abnormal; Notable for the following components:   B Natriuretic Peptide 430.0 (*)    All other components within normal limits  TROPONIN I (HIGH SENSITIVITY) - Abnormal; Notable for the following components:   Troponin I (High Sensitivity) 93 (*)    All other components within normal limits  CBC WITH DIFFERENTIAL/PLATELET  CBG MONITORING, ED  TYPE AND SCREEN  TROPONIN I (HIGH SENSITIVITY)    EKG None  Radiology DG Chest Port 1 View  Result Date: 03/12/2022 CLINICAL DATA:  Shortness of breath. EXAM: PORTABLE CHEST 1 VIEW COMPARISON:  March 13, 22 FINDINGS: Mild streaky bibasilar opacities. No focal confluent consolidation. Similar enlarged cardiac silhouette. No acute osseous abnormality. IMPRESSION: 1.  Mild streaky bibasilar opacities, favor atelectasis. Infection is not excluded. 2. Similar cardiomegaly. Electronically Signed   By: Margaretha Sheffield M.D.   On: 03/12/2022 14:04    Procedures Procedures    Medications Ordered in ED Medications  methylPREDNISolone sodium succinate (SOLU-MEDROL) 125 mg/2 mL injection 125 mg (125 mg Intravenous Given 03/12/22 1439)  magnesium sulfate IVPB 2 g 50 mL (0 g Intravenous Stopped 03/12/22 1520)  ipratropium-albuterol (DUONEB) 0.5-2.5 (3) MG/3ML nebulizer solution  3 mL (3 mLs Nebulization Given 03/12/22 1505)  albuterol (PROVENTIL) (2.5 MG/3ML) 0.083% nebulizer solution 2.5 mg (2.5 mg Nebulization Given 03/12/22 1505)  furosemide (LASIX) injection 40 mg (40 mg Intravenous Given 03/12/22 1525)    ED Course/ Medical Decision Making/ A&P  CRITICAL CARE Performed by: Milton Ferguson Total critical care time: 45 minutes Critical care time was exclusive of separately billable procedures and treating other patients. Critical care was necessary to treat or prevent imminent or life-threatening deterioration. Critical care was time spent personally by me on the following activities: development of treatment plan with patient and/or surrogate as well as nursing, discussions with consultants, evaluation of patient's response to treatment, examination of patient, obtaining history from patient or surrogate, ordering and performing treatments and interventions, ordering and review of laboratory studies, ordering and review of radiographic studies, pulse oximetry and re-evaluation of patient's condition.                          Medical Decision Making Amount and/or Complexity of Data Reviewed Labs: ordered. Radiology: ordered. ECG/medicine tests: ordered.  Risk Prescription drug management. Decision regarding hospitalization.  This patient presents to the ED for concern of syncope, this involves an extensive number of treatment options, and is a complaint that  carries with it a high risk of complications and morbidity.  The differential diagnosis includes stroke, stroke, worsening COPD   Co morbidities that complicate the patient evaluation  COPD and heart failure   Additional history obtained:  Additional history obtained from patient External records from outside source obtained and reviewed including hospital records   Lab Tests:  I Ordered, and personally interpreted labs.  The pertinent results include: Hemoglobin 5.7   Imaging Studies ordered:  I ordered imaging studies including chest x-ray I independently visualized and interpreted imaging which showed unremarkable I agree with the radiologist interpretation   Cardiac Monitoring: / EKG:  The patient was maintained on a cardiac monitor.  I personally viewed and interpreted the cardiac monitored which showed an underlying rhythm of: Normal sinus rhythm   Consultations Obtained:  I requested consultation with the hospitalist,  and discussed lab and imaging findings as well as pertinent plan - they recommend: Admit   Problem List / ED Course / Critical interventions / Medication management  Syncope I ordered medication including prednisone Reevaluation of the patient after these medicines showed that the patient stayed the same I have reviewed the patients home medicines and have made adjustments as needed   Social Determinants of Health:  None   Test / Admission - Considered:  No additional test  Patient with worsening heart failure and worsening COPD.  he will be admitted to medicine.  Patient also has anemia that needs transfusion        Final Clinical Impression(s) / ED Diagnoses Final diagnoses:  Syncope and collapse    Rx / DC Orders ED Discharge Orders     None         Milton Ferguson, MD 03/13/22 1814

## 2022-03-12 NOTE — Assessment & Plan Note (Addendum)
Rhonchi on exam.  Denies dyspnea or cough.  Has chronic respiratory failure on 4 L.  Also on nightly BiPAP.  Chest x-ray suggest atelectasis versus infection.  Afebrile without leukocytosis. -Resume BiPAP and supplemental O2 -Hold off on antibiotics at this time, low threshold to start pending clinical course -DuoNebs as needed  -Solu-Medrol 125 mg x 1 given in ED, holding off on further steroids for now

## 2022-03-12 NOTE — Assessment & Plan Note (Addendum)
Stable. -In the setting of GI bleed hold metoprolol and lisinopril 20 mg initially -d/c home with lower dose of lisinopril 10 mg

## 2022-03-12 NOTE — Assessment & Plan Note (Signed)
Likely due to acute GI blood loss possible hypoglycemia also contributing.

## 2022-03-12 NOTE — Assessment & Plan Note (Addendum)
08/2020 echo EF 35 to 40%, grade 2 DD 08/2021 echo EF 55 to 60%, grade 1 DD Patient appears clinically fluid overloaded Continue IV furosemide>>d/c home with furosemide 40 mg po daily (pt was only taking it prn) Daily weights Accurate I's and O's Previous dry weight 130.1 kg 03/14/22 weight--132.4 kg 03/15/22 weight--103.2 kg (287 lbs) Obtain ReDS reading--33

## 2022-03-12 NOTE — Assessment & Plan Note (Addendum)
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

## 2022-03-12 NOTE — ED Triage Notes (Signed)
Pt to ER via EMS from home.  EMS called out for unresponsive, CBG for EMS 60.  Pt started on D10 infusion, more alert at this time, but remains confused.  Oriented to person only.

## 2022-03-12 NOTE — H&P (Signed)
History and Physical    Shine Mikes Forner ZOX:[id number] DOB: 1950/11/10 DOA: 03/12/2022  PCP: Celene Squibb, MD   Patient coming from: Home  I have personally briefly reviewed patient's old medical records in [place]  Chief Complaint: Unresponsive  HPI: Raciel Caffrey Cly is a 71 y.o. male with medical history significant for COPD, HTN, DM, chronic respiratory failure on 4 L and BIPAP QHS, systolic and diastolic CHF. Patient was brought to the ED with reports of unresponsiveness.  Patient's son found patient.  At the time of my evaluation, patient is awake, lethargic, but able to answer questions.  Patient reports he has been feeling dizzy before he passed out.  He does not know how long it was out.  He reports over the past 4 days he has been having a lot of blood in his stools.  He denies abdominal pain.  No vomiting of blood.  He is not aware of any black stools.  He denies NSAID use.  He is not on blood thinners.  He does not know if he hit his head. He denies chest pain, reports chronic cough, and unchanged difficulty breathing.  He reports good oral intake.   EMS reports patient had a blood sugar of 60.   ED Course: Blood pressure systolic 811- 914.  Temperature 98.6.  Heart rate 85-101.  O2 sat 88% on room air, currently 9200% on 2 L. Hemoglobin low at 5.7.   ABG showed pH of 7.36, PCO2 68, PO2 of 70. Chest x-ray shows streaky bibasilar opacities atelectasis or infection. 2 units PRBC ordered for transfusion. Hospitalist admit for altered mental status.  Review of Systems: As per HPI all other systems reviewed and negative.  Past Medical History:  Diagnosis Date   Anemia    Arthritis    left shoulder   COPD (chronic obstructive pulmonary disease) ([place])    Coronary artery disease 09/11/2020   NSTEMI and 3 stents in [facility]   Diabetes mellitus without complication ([place])    Dyslipidemia    Hypertension    Insomnia    Insomnia    Morbid obesity ([place])    Tobacco use      Past Surgical History:  Procedure Laterality Date   BIOPSY  10/28/2019   Procedure: BIOPSY;  Surgeon: Danie Binder, MD;  [facility];  Service: Endoscopy;;  gastric   BIOPSY  04/15/2020   Procedure: BIOPSY;  Surgeon: Daneil Dolin, MD;  [facility];  Service: Endoscopy;;  gastric   CARDIAC CATHETERIZATION  08/2020   3 stents placed   COLON RESECTION  1990s?   DIVERTICULITIS   COLONOSCOPY WITH PROPOFOL N/A 04/15/2020   Rourk: Poor colon prep. two 6 to 8 mm polyps removed from the hepatic flexure, tubular adenomas.  Repeat colonoscopy later this year.   ENTEROSCOPY N/A 11/26/2021   Procedure: ENTEROSCOPY;  Surgeon: Harvel Quale, MD;  Location: [facility];  Service: Gastroenterology;  Laterality: N/A;   ESOPHAGOGASTRODUODENOSCOPY (EGD) WITH PROPOFOL N/A 10/28/2019   Dr. Oneida Alar: Multiple cratered gastric ulcers, gastritis/duodenitis.  Path showed H. pylori.  Patient treated with amoxicillin/Biaxin/PPI twice daily.   ESOPHAGOGASTRODUODENOSCOPY (EGD) WITH PROPOFOL N/A 04/15/2020   Rourk: Patchy gastric erythema status post biopsy to document eradication of H. pylori.  Biopsy showed persistent H. pylori.  Previously noted gastric ulcer is completely healed.   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   HERNIA REPAIR     UHR   HOT HEMOSTASIS  11/26/2021   Procedure: HOT HEMOSTASIS (ARGON PLASMA COAGULATION/BICAP);  Surgeon: Montez Morita, Quillian Quince, MD;  Location[facility];  Service: Gastroenterology;;   Venia Minks DILATION N/A 04/15/2020   Procedure: Keturah Shavers;  Surgeon: Daneil Dolin, MD;  [facility];  Service: Endoscopy;  Laterality: N/A;   POLYPECTOMY  04/15/2020   Procedure: POLYPECTOMY;  Surgeon: Daneil Dolin, MD;  [facility];  Service: Endoscopy;;  colon   ROTATOR CUFF REPAIR Bilateral    TYMPANOPLASTY        reports that he has been smoking cigarettes. He has been smoking an average of 2 packs per day. He has never used smokeless tobacco. He reports current alcohol use. He reports that he does not use drugs.  Allergies  Allergen Reactions   Caduet [Amlodipine-Atorvastatin] Swelling    Family History  Problem Relation Age of Onset   Diabetes Mother    Heart attack Mother    Heart attack Father    Heart attack Brother    Colon cancer Neg Hx    Stomach cancer Neg Hx     Prior to Admission medications   Medication Sig Start Date End Date Taking? Authorizing Provider  acetaminophen (TYLENOL) 325 MG tablet Take 2 tablets (650 mg total) by mouth every 6 (six) hours as needed for mild pain (or Fever >/= 101). 10/29/19   Roxan Hockey, MD  atorvastatin (LIPITOR) 40 MG tablet Take 40 mg by mouth at bedtime. 08/30/19   [provider]  folic acid (FOLVITE) 1 MG tablet Take 1 tablet (1 mg total) by mouth daily. Patient taking differently: Take 1 mg by mouth at bedtime. 10/30/19   Roxan Hockey, MD  furosemide (LASIX) 40 MG tablet Take 1 tablet (40 mg total) by mouth daily. Patient taking differently: Take 40 mg by mouth daily as needed for fluid or edema. 08/31/21   Orson Eva, MD  gabapentin (NEURONTIN) 300 MG capsule Take 1 capsule by mouth 4 (four) times daily. 12/30/20   [provider]  insulin NPH-regular Human (NOVOLIN 70/30) (70-30) 100 UNIT/ML injection Inject 70 Units into the skin in the morning and at bedtime.    [provider]  Ipratropium-Albuterol (COMBIVENT RESPIMAT) 20-100 MCG/ACT AERS respimat Inhale 1 puff into the lungs every 6 (six) hours as needed for wheezing or shortness of breath. 11/27/21   Manuella Ghazi, Pratik D, DO  lisinopril (PRINIVIL,ZESTRIL) 20 MG tablet Take 1 tablet (20 mg total) by mouth daily. 06/09/17   Cassandria Anger, MD  metoprolol succinate (TOPROL-XL) 100 MG 24 hr tablet TAKE 1 AND 1/2 TABLETS BY MOUTH ONCE A DAY. 02/13/22    Arnoldo Lenis, MD  nitroGLYCERIN (NITROSTAT) 0.4 MG SL tablet DISSOLVE 1 TABLET SUBLINGUALLY AS NEEDED FOR CHEST PAIN, MAY REPEAT EVERY 5 MINUTES. AFTER 3 CALL 911. 02/27/22   Arnoldo Lenis, MD  pantoprazole (PROTONIX) 40 MG tablet TAKE ONE TABLET BY MOUTH 2 TIMES A DAY BEFORE A MEAL Patient taking differently: Take 40 mg by mouth 2 (two) times daily. 11/18/21   Mahala Menghini, PA-C  spironolactone (ALDACTONE) 25 MG tablet TAKE 1 TABLET BY MOUTH ONCE A DAY. 02/10/22   Arnoldo Lenis, MD  SYMBICORT 160-4.5 MCG/ACT inhaler Inhale 2 puffs into the lungs 2 (two) times daily. 09/24/20   [provider]  traMADol (ULTRAM) 50 MG tablet Take 50-100 mg by mouth in the morning and at bedtime.  09/20/19   [provider]  zolpidem (AMBIEN) 5 MG tablet Take 5 mg by mouth at bedtime as needed for sleep. 09/26/20   [provider]    Physical Exam: Vitals:   03/12/22 1400 03/12/22 1430 03/12/22 1500 03/12/22 1505  BP: (!) 114/56 (!) 132/54 (!) 131/56   Pulse: 96 94 89   Resp: 18 (!) 21 (!) 21   Temp:      SpO2: 93% 96% 94% 100%  Weight:      Height:        Constitutional: Lethargic, otherwise calm, comfortable Vitals:   03/12/22 1400 03/12/22 1430 03/12/22 1500 03/12/22 1505  BP: (!) 114/56 (!) 132/54 (!) 131/56   Pulse: 96 94 89   Resp: 18 (!) 21 (!) 21   Temp:      SpO2: 93% 96% 94% 100%  Weight:      Height:       Eyes: PERRL, lids and conjunctivae normal ENMT: Mucous membranes are moist. .  Neck: normal, supple, no masses, no thyromegaly Respiratory: Diffuse rhonchi, with some upper airway transmitted sounds, mild increased work of breathing. Cardiovascular: Regular rate and rhythm, no murmurs / rubs / gallops.  1+ pitting extremity edema.  Abdomen: no tenderness, no masses palpated. No hepatosplenomegaly. Bowel sounds positive.  Musculoskeletal: no clubbing / cyanosis. No joint deformity upper and lower extremities.  Skin: no rashes, lesions, ulcers. No  induration Neurologic: No apparent cranial nerve abnormality, lethargic, but speech is without aphasia.  5 of 5 strength in all extremities Psychiatric: Normal judgment and insight. Alert and oriented x person and place, tells me he is here because of "blood".    Labs on Admission: I have personally reviewed following labs and imaging studies  CBC: Recent Labs  Lab 03/12/22 1424  WBC 9.6  NEUTROABS 8.2*  HGB 5.7*  HCT 22.8*  MCV 76.5*  PLT 782   Basic Metabolic Panel: Recent Labs  Lab 03/12/22 1424  NA 136  K 4.9  CL 98  CO2 32  GLUCOSE 91  BUN 15  CREATININE 1.23  CALCIUM 8.7*   GFR: Estimated Creatinine Clearance: 81.8 mL/min (by C-G formula based on SCr of 1.23 mg/dL). Liver Function Tests: Recent Labs  Lab 03/12/22 1424  AST 13*  ALT 8  ALKPHOS 76  BILITOT 0.5  PROT 6.4*  ALBUMIN 3.3*   CBG: Recent Labs  Lab 03/12/22 1501  GLUCAP 76    Radiological Exams on Admission: DG Chest Port 1 View  Result Date: 03/12/2022 CLINICAL DATA:  Shortness of breath. EXAM: PORTABLE CHEST 1 VIEW COMPARISON:  March 13, 22 FINDINGS: Mild streaky bibasilar opacities. No focal confluent consolidation. Similar enlarged cardiac silhouette. No acute osseous abnormality. IMPRESSION: 1. Mild streaky bibasilar opacities, favor atelectasis. Infection is not excluded. 2. Similar cardiomegaly. Electronically Signed   By: Margaretha Sheffield M.D.   On: 03/12/2022 14:04    EKG: Independently reviewed.  EKG sinus rhythm rate 93, QTc 468.  No significant change from prior.  Assessment/Plan Principal Problem:   Acute metabolic encephalopathy Active Problems:   Symptomatic anemia   Essential hypertension   Insulin dependent type 2 diabetes mellitus (HCC)   Rectal bleeding   Acute on chronic combined systolic and diastolic CHF (congestive heart failure) (HCC)   Syncope   COPD (chronic obstructive pulmonary disease) (HCC)   Assessment and Plan: * Acute metabolic  encephalopathy Initially found unresponsive, improved currently, but lethargic.  Reports initial dizziness, has had bloody stools.  Etiology likely acute  blood loss 2/2 GI bleed .  Also found to be hypoglycemic - 60 by EMS. -Obtain head CT -Transfuse  Symptomatic anemia Hemoglobin 5.7, last check during at recent hospitalization was 7.4. Reports bleeding per rectum over the past 4 days.  Presenting with unresponsiveness/syncope.  Likely from acute blood loss.  Recent hospitalization 11/2021, for GI bleeding, patient underwent push enteroscopy with argon plasma coagulation with multiple AVMs noted by Dr. Jenetta Downer.  Vitals currently stable. -Transfuse 2 units -CBC every 8 hourly x 3 -IV Protonix 40 mg x 2 tonight with transfusion -I consulted Dr. Abbey Chatters, recommended IV Protonix 40 twice daily, liquid diet, n.p.o. midnight, plan for EGD/enteroscopy tomorrow.   COPD (chronic obstructive pulmonary disease) ([place]) Rhonchi on exam.  Denies dyspnea or cough.  Has chronic respiratory failure on 4 L.  Also on nightly BiPAP.  Chest x-ray suggest atelectasis versus infection.  Afebrile without leukocytosis. -Resume BiPAP and supplemental O2 -Hold off on antibiotics at this time, low threshold to start pending clinical course -DuoNebs as needed and scheduled x 3 -Solu-Medrol 125 mg x 1 given in ED, holding off on further steroids for now  Syncope Likely due to acute GI blood loss possible hypoglycemia also contributing.  Acute on chronic combined systolic and diastolic CHF (congestive heart failure) (HCC) 1+ pitting bilateral lower extremity edema, weight appears higher than what appears to be his baseline.  Chest x-ray clear.  BNP 430 similar to prior.  Recent echo 08/2021, EF 55 to 60% with grade 1 DD.  He is on 40 mg Lasix daily. -IV Lasix 40 twice daily for now -Resume spironolactone,     Insulin dependent type 2 diabetes mellitus ([place]) Controlled.  A1c 6.4.  EMS reports glucose of 60, glucose  currently 91. - SSI- s q6h -Hold metformin, home NPH 7030 -70 units twice daily  Essential hypertension Stable. -In the setting of GI bleed hold metoprolol and lisinopril 20 mg for now    DVT prophylaxis: SCDS Code Status: Full Family Communication: None at bedside Disposition Plan: ~/> 2 days Consults called: GI Admission status: Inpt tele  I certify that at the point of admission it is my clinical judgment that the patient will require inpatient hospital care spanning beyond 2 midnights from the point of admission due to high intensity of service, high risk for further deterioration and high frequency of surveillance required.    Author: Bethena Roys, MD 03/12/2022 7:37 PM  For on call review [email or web address].

## 2022-03-12 NOTE — Assessment & Plan Note (Addendum)
-  Patient remains intermittently hyperglycemic during the hospitalization -Hold metformin, home NPH 7030 -70 units twice daily NovoLog sliding scale

## 2022-03-12 NOTE — Assessment & Plan Note (Addendum)
Initially found unresponsive, improved currently, but lethargic.  Reports initial dizziness, has had bloody stools.   Etiology likely acute blood loss 2/2 GI bleed .  Also found to be hypoglycemic - 60 by EMS. -Obtain head CT--negative -Mental status near baseline now

## 2022-03-13 ENCOUNTER — Encounter (HOSPITAL_COMMUNITY)
Admission: EM | Disposition: A | Discharge status: Home / Self Care | Payer: Self-pay | Source: Home / Self Care | Attending: Internal Medicine

## 2022-03-13 ENCOUNTER — Inpatient Hospital Stay (HOSPITAL_COMMUNITY): Payer: Medicare Other | Admitting: Certified Registered"

## 2022-03-13 ENCOUNTER — Inpatient Hospital Stay (HOSPITAL_COMMUNITY): Payer: Medicare Other | Admitting: Certified Registered Nurse Anesthetist

## 2022-03-13 ENCOUNTER — Telehealth: Payer: Self-pay | Admitting: Gastroenterology

## 2022-03-13 ENCOUNTER — Encounter (HOSPITAL_COMMUNITY): Payer: Self-pay | Admitting: Internal Medicine

## 2022-03-13 DIAGNOSIS — I10 Essential (primary) hypertension: Secondary | ICD-10-CM | POA: Diagnosis not present

## 2022-03-13 DIAGNOSIS — K5520 Angiodysplasia of colon without hemorrhage: Secondary | ICD-10-CM

## 2022-03-13 DIAGNOSIS — F17210 Nicotine dependence, cigarettes, uncomplicated: Secondary | ICD-10-CM

## 2022-03-13 DIAGNOSIS — K5521 Angiodysplasia of colon with hemorrhage: Secondary | ICD-10-CM

## 2022-03-13 DIAGNOSIS — E119 Type 2 diabetes mellitus without complications: Secondary | ICD-10-CM | POA: Diagnosis not present

## 2022-03-13 DIAGNOSIS — K31819 Angiodysplasia of stomach and duodenum without bleeding: Secondary | ICD-10-CM

## 2022-03-13 DIAGNOSIS — G9341 Metabolic encephalopathy: Secondary | ICD-10-CM | POA: Diagnosis not present

## 2022-03-13 DIAGNOSIS — J449 Chronic obstructive pulmonary disease, unspecified: Secondary | ICD-10-CM

## 2022-03-13 HISTORY — PX: ENTEROSCOPY: SHX5533

## 2022-03-13 HISTORY — PX: ESOPHAGOGASTRODUODENOSCOPY (EGD) WITH PROPOFOL: SHX5813

## 2022-03-13 LAB — BASIC METABOLIC PANEL
Anion gap: 12 (ref 5–15)
BUN: 24 mg/dL — ABNORMAL HIGH (ref 8–23)
CO2: 31 mmol/L (ref 22–32)
Calcium: 9 mg/dL (ref 8.9–10.3)
Chloride: 93 mmol/L — ABNORMAL LOW (ref 98–111)
Creatinine, Ser: 1.4 mg/dL — ABNORMAL HIGH (ref 0.61–1.24)
GFR, Estimated: 55 mL/min — ABNORMAL LOW (ref >60)
Glucose, Bld: 237 mg/dL — ABNORMAL HIGH (ref 70–99)
Potassium: 4.4 mmol/L (ref 3.5–5.1)
Sodium: 136 mmol/L (ref 135–145)

## 2022-03-13 LAB — GLUCOSE, CAPILLARY
Glucose-Capillary: 167 mg/dL — ABNORMAL HIGH (ref 70–99)
Glucose-Capillary: 176 mg/dL — ABNORMAL HIGH (ref 70–99)
Glucose-Capillary: 192 mg/dL — ABNORMAL HIGH (ref 70–99)
Glucose-Capillary: 211 mg/dL — ABNORMAL HIGH (ref 70–99)
Glucose-Capillary: 243 mg/dL — ABNORMAL HIGH (ref 70–99)

## 2022-03-13 LAB — TYPE AND SCREEN
ABO/RH(D): A POS
Antibody Screen: NEGATIVE
Unit division: 0
Unit division: 0
Unit division: 0

## 2022-03-13 LAB — CBC
HCT: 29.4 % — ABNORMAL LOW (ref 39.0–52.0)
HCT: 27.7 % — ABNORMAL LOW (ref 39.0–52.0)
Hemoglobin: 8 g/dL — ABNORMAL LOW (ref 13.0–17.0)
Hemoglobin: 7.6 g/dL — ABNORMAL LOW (ref 13.0–17.0)
MCH: 20.7 pg — ABNORMAL LOW (ref 26.0–34.0)
MCH: 20.8 pg — ABNORMAL LOW (ref 26.0–34.0)
MCHC: 27.2 g/dL — ABNORMAL LOW (ref 30.0–36.0)
MCHC: 27.4 g/dL — ABNORMAL LOW (ref 30.0–36.0)
MCV: 76 fL — ABNORMAL LOW (ref 80.0–100.0)
MCV: 75.9 fL — ABNORMAL LOW (ref 80.0–100.0)
Platelets: 222 10*3/uL (ref 150–400)
Platelets: 216 10*3/uL (ref 150–400)
RBC: 3.87 MIL/uL — ABNORMAL LOW (ref 4.22–5.81)
RBC: 3.65 MIL/uL — ABNORMAL LOW (ref 4.22–5.81)
RDW: 18.8 % — ABNORMAL HIGH (ref 11.5–15.5)
RDW: 19.4 % — ABNORMAL HIGH (ref 11.5–15.5)
WBC: 10.8 10*3/uL — ABNORMAL HIGH (ref 4.0–10.5)
WBC: 15.5 10*3/uL — ABNORMAL HIGH (ref 4.0–10.5)
nRBC: 0 % (ref 0.0–0.2)
nRBC: 0.1 % (ref 0.0–0.2)

## 2022-03-13 LAB — FOLATE: Folate: 4.7 ng/mL — ABNORMAL LOW (ref >5.9)

## 2022-03-13 LAB — IRON AND TIBC
Iron: 20 ug/dL — ABNORMAL LOW (ref 45–182)
Saturation Ratios: 4 % — ABNORMAL LOW (ref 17.9–39.5)
TIBC: 543 ug/dL — ABNORMAL HIGH (ref 250–450)
UIBC: 523 ug/dL

## 2022-03-13 LAB — MRSA NEXT GEN BY PCR, NASAL: MRSA by PCR Next Gen: NOT DETECTED

## 2022-03-13 LAB — BPAM RBC
Blood Product Expiration Date: 202307072359
Blood Product Expiration Date: 202307072359
Blood Product Expiration Date: 202307172359
ISSUE DATE / TIME: 202306291724
ISSUE DATE / TIME: 202306291816
ISSUE DATE / TIME: 202306292220
Unit Type and Rh: 600
Unit Type and Rh: 600
Unit Type and Rh: 6200

## 2022-03-13 LAB — VITAMIN B12: Vitamin B-12: 233 pg/mL (ref 180–914)

## 2022-03-13 LAB — RETICULOCYTES
Immature Retic Fract: 40.3 % — ABNORMAL HIGH (ref 2.3–15.9)
RBC.: 3.86 MIL/uL — ABNORMAL LOW (ref 4.22–5.81)
Retic Count, Absolute: 91.9 10*3/uL (ref 19.0–186.0)
Retic Ct Pct: 2.4 % (ref 0.4–3.1)

## 2022-03-13 LAB — FERRITIN: Ferritin: 5 ng/mL — ABNORMAL LOW (ref 24–336)

## 2022-03-13 LAB — HIV ANTIBODY (ROUTINE TESTING W REFLEX): HIV Screen 4th Generation wRfx: NONREACTIVE

## 2022-03-13 SURGERY — ESOPHAGOGASTRODUODENOSCOPY (EGD) WITH PROPOFOL
Anesthesia: General

## 2022-03-13 MED ORDER — LIDOCAINE HCL (CARDIAC) PF 100 MG/5ML IV SOSY
PREFILLED_SYRINGE | INTRAVENOUS | Status: DC | PRN
  Administered 2022-03-13: 50 mg via INTRAVENOUS

## 2022-03-13 MED ORDER — FUROSEMIDE 10 MG/ML IJ SOLN
40.0000 mg | Freq: Every day | INTRAMUSCULAR | Status: DC
  Administered 2022-03-14 – 2022-03-15 (×2): 40 mg via INTRAVENOUS
  Filled 2022-03-13 (×2): qty 4

## 2022-03-13 MED ORDER — GLYCOPYRROLATE PF 0.2 MG/ML IJ SOSY
PREFILLED_SYRINGE | INTRAMUSCULAR | Status: AC
  Filled 2022-03-13: qty 1

## 2022-03-13 MED ORDER — PROPOFOL 10 MG/ML IV BOLUS
INTRAVENOUS | Status: DC | PRN
  Administered 2022-03-13: 50 mg via INTRAVENOUS
  Administered 2022-03-13 (×2): 20 mg via INTRAVENOUS
  Administered 2022-03-13: 40 mg via INTRAVENOUS
  Administered 2022-03-13: 30 mg via INTRAVENOUS

## 2022-03-13 MED ORDER — LACTATED RINGERS IV SOLN
INTRAVENOUS | Status: DC
  Administered 2022-03-13: 1000 mL via INTRAVENOUS

## 2022-03-13 MED ORDER — IPRATROPIUM-ALBUTEROL 0.5-2.5 (3) MG/3ML IN SOLN
3.0000 mL | Freq: Three times a day (TID) | RESPIRATORY_TRACT | Status: DC
  Administered 2022-03-13 – 2022-03-15 (×7): 3 mL via RESPIRATORY_TRACT
  Filled 2022-03-13 (×6): qty 3

## 2022-03-13 MED ORDER — SODIUM CHLORIDE 0.9 % IV SOLN: INTRAVENOUS | Status: DC

## 2022-03-13 MED ORDER — MELATONIN 3 MG PO TABS
6.0000 mg | ORAL_TABLET | Freq: Once | ORAL | Status: AC
  Administered 2022-03-13: 6 mg via ORAL
  Filled 2022-03-13: qty 2

## 2022-03-13 MED ORDER — KETAMINE HCL 10 MG/ML IJ SOLN
INTRAMUSCULAR | Status: DC | PRN
  Administered 2022-03-13: 20 mg via INTRAVENOUS

## 2022-03-13 MED ORDER — KETAMINE HCL 50 MG/5ML IJ SOSY
PREFILLED_SYRINGE | INTRAMUSCULAR | Status: AC
  Filled 2022-03-13: qty 5

## 2022-03-13 MED ORDER — GLYCOPYRROLATE 0.2 MG/ML IJ SOLN
INTRAMUSCULAR | Status: DC | PRN
  Administered 2022-03-13: .1 mg via INTRAVENOUS

## 2022-03-13 NOTE — Consult Note (Signed)
Gastroenterology Consult   Referring Provider: No ref. provider found Primary Care Physician:  Celene Squibb, MD Primary Gastroenterologist:  Dr. Gala Romney  Patient ID: Gregory Alvarado; [id number]; [birth year]   Admit date: 03/12/2022  LOS: 1 day   Date of Consultation: 03/13/2022  Reason for Consultation: Symptomatic anemia, melena, history of small bowel AVMs  History of Present Illness   Gregory Alvarado is a 71 y.o. 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  He is brought to the ED for unresponsiveness as he was found by his son.  On admission he stated that he did feel dizzy before he passed out but does not know how long he was out for.  Hemoglobin found to be 5.7 on admission, GI consulted for further evaluation and management of symptomatic anemia with known history of AVMs.   ED course: Vital signs stable on arrival except for oxygen saturation is 88% on room air, improved to 92% on 2 L nasal cannula Hgb 5.7, MCV 76.5, BUN 15, BNP 430 CXR with streaky bibasilar opacities representing atelectasis versus infection He was given 2 units PRBCs Admitted for altered mental status Blood glucose 60 by EMS   Consult:  Intermittent dark stools for years, dark stools did not stop after last hospitalization. He states for about 6-8 weeks he feels great after being tanked up and then he starts feeling sluggish again. Denies NSAID use, and very very little alcohol use. Stools have bene more loose but have been dark not full black. Not on oral iron at home, he states it causes him constipation too much. Denies PICA.  Denies worsening of shortness of breath. Does not feel like he hit his head at home when he passed out.  He has continued to smoke.  Prior GI work-up: Last colonoscopy 04/15/2020 -inadequate preparation of the colon, two six 8 mm polyps at the hepatic flexure  EGD  August 2021 with normal esophagus s/p dilation, patchy gastric erythema, previously noted gastric ulcer completely healed, normal duodenum  EGD/enteroscopy November 2021 -multiple angioectasias in the duodenum and jejunum s/p APC therapy  Last EGD 11/26/2021 showing 1 recent bleeding angiodysplastic lesion in the stomach, 6 nonbleeding lesions in the duodenum, and 6 nonbleeding lesions in the jejunum all treated with APC therapy.  He has been on PPI twice daily at home.  Past Medical History:  Diagnosis Date   Anemia    Arthritis    left shoulder   COPD (chronic obstructive pulmonary disease) ([place])    Coronary artery disease 09/11/2020   NSTEMI and 3 stents in [facility]   Diabetes mellitus without complication ([place])    Dyslipidemia    Hypertension    Insomnia    Insomnia    Morbid obesity ([place])    Tobacco use     Past Surgical History:  Procedure Laterality Date   BIOPSY  10/28/2019   Procedure: BIOPSY;  Surgeon: Danie Binder, MD;  [facility];  Service: Endoscopy;;  gastric   BIOPSY  04/15/2020   Procedure: BIOPSY;  Surgeon: Daneil Dolin, MD;  [facility];  Service: Endoscopy;;  gastric   CARDIAC CATHETERIZATION  08/2020   3 stents placed   COLON RESECTION  1990s?   DIVERTICULITIS   COLONOSCOPY WITH PROPOFOL N/A 04/15/2020   Rourk: Poor colon prep. two 6 to 8 mm polyps removed  from the hepatic flexure, tubular adenomas.  Repeat colonoscopy later this year.   ENTEROSCOPY N/A 11/26/2021   Procedure: ENTEROSCOPY;  Surgeon: Harvel Quale, MD;  [facility];  Service: Gastroenterology;  Laterality: N/A;   ESOPHAGOGASTRODUODENOSCOPY (EGD) WITH PROPOFOL N/A 10/28/2019   Dr. Oneida Alar: Multiple cratered gastric ulcers, gastritis/duodenitis.  Path showed H. pylori.  Patient treated with amoxicillin/Biaxin/PPI twice daily.   ESOPHAGOGASTRODUODENOSCOPY (EGD) WITH PROPOFOL N/A 04/15/2020   Rourk: Patchy gastric erythema status post biopsy to  document eradication of H. pylori.  Biopsy showed persistent H. pylori.  Previously noted gastric ulcer is completely healed.   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   HERNIA REPAIR     UHR   HOT HEMOSTASIS  11/26/2021   Procedure: HOT HEMOSTASIS (ARGON PLASMA COAGULATION/BICAP);  Surgeon: Montez Morita, Quillian Quince, MD;  [facility];  Service: Gastroenterology;;   Venia Minks DILATION N/A 04/15/2020   Procedure: Keturah Shavers;  Surgeon: Daneil Dolin, MD;  [facility];  Service: Endoscopy;  Laterality: N/A;   POLYPECTOMY  04/15/2020   Procedure: POLYPECTOMY;  Surgeon: Daneil Dolin, MD;  [facility];  Service: Endoscopy;;  colon   ROTATOR CUFF REPAIR Bilateral    TYMPANOPLASTY      Prior to Admission medications   Medication Sig Start Date End Date Taking? Authorizing Provider  acetaminophen (TYLENOL) 325 MG tablet Take 2 tablets (650 mg total) by mouth every 6 (six) hours as needed for mild pain (or Fever >/= 101). 10/29/19  Yes Emokpae, Courage, MD  atorvastatin (LIPITOR) 80 MG tablet Take 80 mg by mouth daily. 01/26/22  Yes [provider]  cyanocobalamin (,VITAMIN B-12,) 1000 MCG/ML injection Inject 1,000 mcg into the muscle every 30 (thirty) days. 02/11/22  Yes [provider]  furosemide (LASIX) 40 MG tablet Take 1 tablet (40 mg total) by mouth daily. Patient taking differently: Take 40 mg by mouth daily as needed for fluid or edema. 08/31/21  Yes Tat, Shanon Brow, MD  gabapentin (NEURONTIN) 300 MG capsule Take 1 capsule by mouth 4 (four) times daily. 12/30/20  Yes [provider]  insulin NPH-regular Human (NOVOLIN 70/30) (70-30) 100 UNIT/ML injection Inject 70 Units into the skin in the morning and at bedtime.   Yes [provider]  Ipratropium-Albuterol (COMBIVENT RESPIMAT) 20-100 MCG/ACT AERS respimat Inhale 1  puff into the lungs every 6 (six) hours as needed for wheezing or shortness of breath. 11/27/21  Yes Shah, Pratik D, DO  levocetirizine (XYZAL) 5 MG tablet Take 5 mg by mouth at bedtime. 03/11/22  Yes [provider]  lisinopril (PRINIVIL,ZESTRIL) 20 MG tablet Take 1 tablet (20 mg total) by mouth daily. 06/09/17  Yes Nida, Marella Chimes, MD  metoprolol succinate (TOPROL-XL) 100 MG 24 hr tablet TAKE 1 AND 1/2 TABLETS BY MOUTH ONCE A DAY. 02/13/22  Yes Branch, Alphonse Guild, MD  traMADol (ULTRAM) 50 MG tablet Take 50-100 mg by mouth in the morning and at bedtime. 09/20/19  Yes [provider]  zolpidem (AMBIEN) 5 MG tablet Take 5 mg by mouth at bedtime as needed for sleep. 09/26/20  Yes [provider]  folic acid (FOLVITE) 1 MG tablet Take 1 tablet (1 mg total) by mouth daily. Patient not taking: Reported on 03/13/2022 10/30/19   Roxan Hockey, MD  nitroGLYCERIN (NITROSTAT) 0.4 MG SL tablet DISSOLVE 1 TABLET SUBLINGUALLY AS NEEDED FOR CHEST PAIN, MAY  REPEAT EVERY 5 MINUTES. AFTER 3 CALL 911. Patient not taking: Reported on 03/13/2022 02/27/22   Arnoldo Lenis, MD  pantoprazole (PROTONIX) 40 MG tablet TAKE ONE TABLET BY MOUTH 2 TIMES A DAY BEFORE A MEAL Patient not taking: Reported on 03/13/2022 11/18/21   Mahala Menghini, PA-C  spironolactone (ALDACTONE) 25 MG tablet TAKE 1 TABLET BY MOUTH ONCE A DAY. Patient not taking: Reported on 03/13/2022 02/10/22   Arnoldo Lenis, MD    Current Facility-Administered Medications  Medication Dose Route Frequency Provider Last Rate Last Admin   acetaminophen (TYLENOL) tablet 650 mg  650 mg Oral Q6H PRN Emokpae, Ejiroghene E, MD       Or   acetaminophen (TYLENOL) suppository 650 mg  650 mg Rectal Q6H PRN Emokpae, Ejiroghene E, MD       furosemide (LASIX) injection 40 mg  40 mg Intravenous Q12H Emokpae, Ejiroghene E, MD   40 mg at 03/13/22 0816   insulin aspart (novoLOG) injection 0-9 Units  0-9 Units Subcutaneous Q6H Emokpae, Ejiroghene E,  MD   3 Units at 03/13/22 0609   ipratropium-albuterol (DUONEB) 0.5-2.5 (3) MG/3ML nebulizer solution 3 mL  3 mL Nebulization Q4H PRN Emokpae, Ejiroghene E, MD       ipratropium-albuterol (DUONEB) 0.5-2.5 (3) MG/3ML nebulizer solution 3 mL  3 mL Nebulization TID Emokpae, Ejiroghene E, MD   3 mL at 03/13/22 0829   ondansetron (ZOFRAN) tablet 4 mg  4 mg Oral Q6H PRN Emokpae, Ejiroghene E, MD       Or   ondansetron (ZOFRAN) injection 4 mg  4 mg Intravenous Q6H PRN Emokpae, Ejiroghene E, MD       pantoprazole (PROTONIX) injection 40 mg  40 mg Intravenous Q12H Emokpae, Ejiroghene E, MD   40 mg at 03/13/22 0814   polyethylene glycol (MIRALAX / GLYCOLAX) packet 17 g  17 g Oral Daily PRN Emokpae, Ejiroghene E, MD        Allergies as of 03/12/2022 - Review Complete 03/12/2022  Allergen Reaction Noted   Caduet [amlodipine-atorvastatin] Swelling 09/18/2015    Family History  Problem Relation Age of Onset   Diabetes Mother    Heart attack Mother    Heart attack Father    Heart attack Brother    Colon cancer Neg Hx    Stomach cancer Neg Hx     Social History   Socioeconomic History   Marital status: Divorced    Spouse name: Not on file   Number of children: 2   Years of education: Not on file   Highest education level: High school graduate  Occupational History   Occupation: Retired  Tobacco Use   Smoking status: Every Day    Packs/day: 2.00    Types: Cigarettes   Smokeless tobacco: Never  Vaping Use   Vaping Use: Never used  Substance and Sexual Activity   Alcohol use: Yes    Comment: very rare   Drug use: No   Sexual activity: Not on file  Other Topics Concern   Not on file  Social History Narrative   Not on file   Social Determinants of Health   Financial Resource Strain: Low Risk  (08/06/2020)   Overall Financial Resource Strain (CARDIA)    Difficulty of Paying Living Expenses: Not very hard  Food Insecurity: No Food Insecurity (08/02/2020)   Hunger Vital Sign     Worried About Running Out of Food in the Last Year: Never true    Ran Out of Food in  the Last Year: Never true  Transportation Needs: No Transportation Needs (08/02/2020)   PRAPARE - Hydrologist (Medical): No    Lack of Transportation (Non-Medical): No  Physical Activity: Inactive (08/06/2020)   Exercise Vital Sign    Days of Exercise per Week: 0 days    Minutes of Exercise per Session: 0 min  Stress: No Stress Concern Present (08/06/2020)   [place]    Feeling of Stress : Not at all  Social Connections: Socially Isolated (08/06/2020)   Social Connection and Isolation Panel [NHANES]    Frequency of Communication with Friends and Family: Three times a week    Frequency of Social Gatherings with Friends and Family: Three times a week    Attends Religious Services: Never    Active Member of Clubs or Organizations: No    Attends Archivist Meetings: Never    Marital Status: Divorced  Human resources officer Violence: Unknown (08/06/2020)   Humiliation, Afraid, Rape, and Kick questionnaire    Fear of Current or Ex-Partner: No    Emotionally Abused: No    Physically Abused: No    Sexually Abused: Not on file     Review of Systems   Gen: Denies any fever, chills, loss of appetite, change in weight or weight loss CV: Denies chest pain, heart palpitations, syncope, edema  Resp: Denies shortness of breath with rest, cough, wheezing, coughing up blood, and pleurisy. GI: See HPI GU : Denies urinary burning, blood in urine, urinary frequency, and urinary incontinence. MS: Denies joint pain, limitation of movement, swelling, cramps, and atrophy.  Derm: Denies rash, itching, dry skin, hives. Psych: Denies depression, anxiety, memory loss, hallucinations, and confusion. Heme: Denies bruising or bleeding Neuro:  Denies any headaches, dizziness, paresthesias, shaking  Physical Exam   Vital  Signs in last 24 hours: Temp:  [97.5 F (36.4 C)-98.7 F (37.1 C)] 98.3 F (36.8 C) (06/30 0546) Pulse Rate:  [82-101] 94 (06/30 0546) Resp:  [18-22] 20 (06/30 0546) BP: (114-143)/(48-73) 130/48 (06/30 0546) SpO2:  [88 %-100 %] 98 % (06/30 0832) Weight:  [135 kg] 135 kg (06/29 1329) Last BM Date : 03/12/22  General:   Alert,  Well-developed, well-nourished, pleasant and cooperative in NAD Head:  Normocephalic and atraumatic. Eyes:  Sclera clear, no icterus.   Conjunctiva pink. Ears:  Normal auditory acuity. Lungs: On 4 L nasal cannula. Bilateral expiratory wheezing noted throughout all lung fields.  No increased work of breathing. Heart:  Regular rate and rhythm; no murmurs, clicks, rubs,  or gallops. Abdomen: Soft, nontender and nondistended. No masses, hepatosplenomegaly or hernias noted. Normal bowel sounds, without guarding, and without rebound.   Rectal: deferred   Msk:  Symmetrical without gross deformities. Normal posture. Neurologic:  Alert and  oriented x4. Skin:  Intact without significant lesions or rashes. Psych:  Alert and cooperative. Normal mood and affect.  Intake/Output from previous day: 06/29 0701 - 06/30 0700 In: 1048 [P.O.:120; I.V.:206.7; VVOHY:073; IV Piggyback:33.3] Out: 2150 [Urine:2150] Intake/Output this shift: Total I/O In: 344 [Blood:344] Out: -    Labs/Studies   Recent Labs Recent Labs    03/12/22 1424 03/13/22 0505  WBC 9.6 10.8*  HGB 5.7* 8.0*  HCT 22.8* 29.4*  PLT 205 222   BMET Recent Labs    03/12/22 1424 03/13/22 0505  NA 136 136  K 4.9 4.4  CL 98 93*  CO2 32 31  GLUCOSE 91 237*  BUN 15  24*  CREATININE 1.23 1.40*  CALCIUM 8.7* 9.0   LFT Recent Labs    03/12/22 1424  PROT 6.4*  ALBUMIN 3.3*  AST 13*  ALT 8  ALKPHOS 76  BILITOT 0.5   PT/INR No results for input(s): "LABPROT", "INR" in the last 72 hours. Hepatitis Panel No results for input(s): "HEPBSAG", "HCVAB", "HEPAIGM", "HEPBIGM" in the last 72  hours. C-Diff No results for input(s): "CDIFFTOX" in the last 72 hours.  Radiology/Studies CT HEAD WO CONTRAST (5MM)  Result Date: 03/12/2022 CLINICAL DATA:  Initial evaluation for altered mental status. EXAM: CT HEAD WITHOUT CONTRAST TECHNIQUE: Contiguous axial images were obtained from the base of the skull through the vertex without intravenous contrast. RADIATION DOSE REDUCTION: This exam was performed according to the departmental dose-optimization program which includes automated exposure control, adjustment of the mA and/or kV according to patient size and/or use of iterative reconstruction technique. COMPARISON:  Prior CT from 11/21/2012. FINDINGS: Brain: Cerebral volume within normal limits for age. Mild chronic microvascular ischemic disease noted involving the supratentorial cerebral white matter. No acute intracranial hemorrhage. No acute large vessel territory infarct. No mass lesion, mass effect or midline shift. No hydrocephalus or extra-axial fluid collection. Vascular: No abnormal hyperdense vessel. Skull: Scalp soft tissues and calvarium within normal limits. Sinuses/Orbits: Globes orbital soft tissues within normal limits. Paranasal sinuses are largely clear. Sequelae of prior mastoidectomy on the left. Small volume mucosal thickening about the mastoid bowl. Trace right mastoid effusion. Other: None. IMPRESSION: 1. No acute intracranial abnormality. 2. Mild chronic microvascular ischemic disease. Electronically Signed   By: Jeannine Boga M.D.   On: 03/12/2022 22:05   DG Chest Port 1 View  Result Date: 03/12/2022 CLINICAL DATA:  Shortness of breath. EXAM: PORTABLE CHEST 1 VIEW COMPARISON:  March 13, 22 FINDINGS: Mild streaky bibasilar opacities. No focal confluent consolidation. Similar enlarged cardiac silhouette. No acute osseous abnormality. IMPRESSION: 1. Mild streaky bibasilar opacities, favor atelectasis. Infection is not excluded. 2. Similar cardiomegaly. Electronically  Signed   By: Margaretha Sheffield M.D.   On: 03/12/2022 14:04     Assessment   Gregory Alvarado is a 71 y.o. 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  He is brought to the ED for unresponsiveness as he was found by his son.  On admission he stated that he did feel dizzy before he passed out but does not know how long he was out for.  Hemoglobin found to be 5.7 on admission, GI consulted for further evaluation and management of symptomatic anemia with known history of small bowel AVMs.   History of small bowel AVMs/Acute on chronic anemia: Presented to the ED with hemoglobin 5.7 with microcytic indices.  He was given 2 units PRBCs and started on IV Protonix twice daily.  Hemoglobin improved to 8 this morning.  He was to have repeat CBC and GI follow-up post last hospitalization but neither was completed.  He has had prior upper endoscopies with enteroscopy showed multiple angioectasias in the duodenum and jejunum s/p APC therapy.  He was previously on aspirin and Effient after cardiac stent placement in December 2021 but is currently only on aspirin.  Last EGD 11/26/2021 showing 1 recent bleeding angiodysplastic lesion in the stomach, 6 nonbleeding lesions in the duodenum, and 6 nonbleeding lesions in the jejunum all treated with APC therapy.  He has been on PPI twice daily  at home.  He states has been having dark stools since his prior admission in March, they have not improved to a normal brown color.  He has not been on any oral iron at home, denies frequent NSAID use, very rare alcohol use.  Given increased frequency of anemia, he may need subcutaneous octreotide on discharge.  We will proceed with EGD/enteroscopy with Dr. Jenetta Downer today.  I have discussed the risks, alternatives, benefits with regards to but not limited to the risk of reaction to medication, bleeding, infection,  perforation and the patient is agreeable to proceed. Written consent to be obtained.  Chronic GERD/history of H. Pylori: EGD in August 2021 noting previously healed gastric ulcer.  This pathology was consistent with H. pylori.  He was prescribed quadruple therapy with doxycycline in place of tetracycline and took therapy as directed.  He has not had any documented history of eradication of H. pylori.  He has been on PPI twice daily at home as directed without breakthrough symptoms.  COPD: Patient is on baseline 4 L oxygen at home.  Not currently using any BiPAP or CPAP at home.  He denies any recent worsening shortness of breath above his baseline.  Acute metabolic encephalopathy: Patient initially lethargic and dizzy on arrival.  Mental status has improved, patient alert and oriented x4.  CT head without contrast with no acute intracranial abnormality.  Plan / Recommendations    N.p.o. EGD/enteroscopy with Dr. Jenetta Downer today Hold aspirin Continue IV PPI twice daily May need to consider octreotide subq on discharge. Continue to monitor H/H, transfuse for hemoglobin less than 7 Needs follow-up H. pylori eradication testing. Please follow-up for outpatient colonoscopy     03/13/2022, 9:35 AM  Venetia Night, MSN, FNP-BC, AGACNP-BC [facility]

## 2022-03-13 NOTE — H&P (View-Only) (Signed)
Gastroenterology Consult   Referring Provider: No ref. provider found Primary Care Physician:  Celene Squibb, MD Primary Gastroenterologist:  Dr. Gala Romney  Patient ID: Gregory Alvarado; [id number]; Sep 23, 1950   Admit date: 03/12/2022  LOS: 1 day   Date of Consultation: 03/13/2022  Reason for Consultation: Symptomatic anemia, melena, history of small bowel AVMs  History of Present Illness   Gregory Alvarado is a 71 y.o. 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  He is brought to the ED for unresponsiveness as he was found by his son.  On admission he stated that he did feel dizzy before he passed out but does not know how long he was out for.  Hemoglobin found to be 5.7 on admission, GI consulted for further evaluation and management of symptomatic anemia with known history of AVMs.   ED course: Vital signs stable on arrival except for oxygen saturation is 88% on room air, improved to 92% on 2 L nasal cannula Hgb 5.7, MCV 76.5, BUN 15, BNP 430 CXR with streaky bibasilar opacities representing atelectasis versus infection He was given 2 units PRBCs Admitted for altered mental status Blood glucose 60 by EMS   Consult:  Intermittent dark stools for years, dark stools did not stop after last hospitalization. He states for about 6-8 weeks he feels great after being tanked up and then he starts feeling sluggish again. Denies NSAID use, and very very little alcohol use. Stools have bene more loose but have been dark not full black. Not on oral iron at home, he states it causes him constipation too much. Denies PICA.  Denies worsening of shortness of breath. Does not feel like he hit his head at home when he passed out.  He has continued to smoke.  Prior GI work-up: Last colonoscopy 04/15/2020 -inadequate preparation of the colon, two six 8 mm polyps at the hepatic flexure  EGD  August 2021 with normal esophagus s/p dilation, patchy gastric erythema, previously noted gastric ulcer completely healed, normal duodenum  EGD/enteroscopy November 2021 -multiple angioectasias in the duodenum and jejunum s/p APC therapy  Last EGD 11/26/2021 showing 1 recent bleeding angiodysplastic lesion in the stomach, 6 nonbleeding lesions in the duodenum, and 6 nonbleeding lesions in the jejunum all treated with APC therapy.  He has been on PPI twice daily at home.  Past Medical History:  Diagnosis Date   Anemia    Arthritis    left shoulder   COPD (chronic obstructive pulmonary disease) ([place])    Coronary artery disease 09/11/2020   NSTEMI and 3 stents in [facility]   Diabetes mellitus without complication ([place])    Dyslipidemia    Hypertension    Insomnia    Insomnia    Morbid obesity ([place])    Tobacco use     Past Surgical History:  Procedure Laterality Date   BIOPSY  10/28/2019   Procedure: BIOPSY;  Surgeon: Danie Binder, MD;  [facility];  Service: Endoscopy;;  gastric   BIOPSY  04/15/2020   Procedure: BIOPSY;  Surgeon: Daneil Dolin, MD;  [facility];  Service: Endoscopy;;  gastric   CARDIAC CATHETERIZATION  08/2020   3 stents placed   COLON RESECTION  1990s?   DIVERTICULITIS   COLONOSCOPY WITH PROPOFOL N/A 04/15/2020   Rourk: Poor colon prep. two 6 to 8 mm polyps removed  from the hepatic flexure, tubular adenomas.  Repeat colonoscopy later this year.   ENTEROSCOPY N/A 11/26/2021   Procedure: ENTEROSCOPY;  Surgeon: Harvel Quale, MD;  [facility];  Service: Gastroenterology;  Laterality: N/A;   ESOPHAGOGASTRODUODENOSCOPY (EGD) WITH PROPOFOL N/A 10/28/2019   Dr. Oneida Alar: Multiple cratered gastric ulcers, gastritis/duodenitis.  Path showed H. pylori.  Patient treated with amoxicillin/Biaxin/PPI twice daily.   ESOPHAGOGASTRODUODENOSCOPY (EGD) WITH PROPOFOL N/A 04/15/2020   Rourk: Patchy gastric erythema status post biopsy to  document eradication of H. pylori.  Biopsy showed persistent H. pylori.  Previously noted gastric ulcer is completely healed.   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   HERNIA REPAIR     UHR   HOT HEMOSTASIS  11/26/2021   Procedure: HOT HEMOSTASIS (ARGON PLASMA COAGULATION/BICAP);  Surgeon: Montez Morita, Quillian Quince, MD;  [facility];  Service: Gastroenterology;;   Venia Minks DILATION N/A 04/15/2020   Procedure: Keturah Shavers;  Surgeon: Daneil Dolin, MD;  [facility];  Service: Endoscopy;  Laterality: N/A;   POLYPECTOMY  04/15/2020   Procedure: POLYPECTOMY;  Surgeon: Daneil Dolin, MD;  [facility];  Service: Endoscopy;;  colon   ROTATOR CUFF REPAIR Bilateral    TYMPANOPLASTY      Prior to Admission medications   Medication Sig Start Date End Date Taking? Authorizing Provider  acetaminophen (TYLENOL) 325 MG tablet Take 2 tablets (650 mg total) by mouth every 6 (six) hours as needed for mild pain (or Fever >/= 101). 10/29/19  Yes Emokpae, Courage, MD  atorvastatin (LIPITOR) 80 MG tablet Take 80 mg by mouth daily. 01/26/22  Yes [provider]  cyanocobalamin (,VITAMIN B-12,) 1000 MCG/ML injection Inject 1,000 mcg into the muscle every 30 (thirty) days. 02/11/22  Yes [provider]  furosemide (LASIX) 40 MG tablet Take 1 tablet (40 mg total) by mouth daily. Patient taking differently: Take 40 mg by mouth daily as needed for fluid or edema. 08/31/21  Yes Tat, Shanon Brow, MD  gabapentin (NEURONTIN) 300 MG capsule Take 1 capsule by mouth 4 (four) times daily. 12/30/20  Yes [provider]  insulin NPH-regular Human (NOVOLIN 70/30) (70-30) 100 UNIT/ML injection Inject 70 Units into the skin in the morning and at bedtime.   Yes [provider]  Ipratropium-Albuterol (COMBIVENT RESPIMAT) 20-100 MCG/ACT AERS respimat Inhale 1  puff into the lungs every 6 (six) hours as needed for wheezing or shortness of breath. 11/27/21  Yes Shah, Pratik D, DO  levocetirizine (XYZAL) 5 MG tablet Take 5 mg by mouth at bedtime. 03/11/22  Yes [provider]  lisinopril (PRINIVIL,ZESTRIL) 20 MG tablet Take 1 tablet (20 mg total) by mouth daily. 06/09/17  Yes Nida, Marella Chimes, MD  metoprolol succinate (TOPROL-XL) 100 MG 24 hr tablet TAKE 1 AND 1/2 TABLETS BY MOUTH ONCE A DAY. 02/13/22  Yes Branch, Alphonse Guild, MD  traMADol (ULTRAM) 50 MG tablet Take 50-100 mg by mouth in the morning and at bedtime. 09/20/19  Yes [provider]  zolpidem (AMBIEN) 5 MG tablet Take 5 mg by mouth at bedtime as needed for sleep. 09/26/20  Yes [provider]  folic acid (FOLVITE) 1 MG tablet Take 1 tablet (1 mg total) by mouth daily. Patient not taking: Reported on 03/13/2022 10/30/19   Roxan Hockey, MD  nitroGLYCERIN (NITROSTAT) 0.4 MG SL tablet DISSOLVE 1 TABLET SUBLINGUALLY AS NEEDED FOR CHEST PAIN, MAY  REPEAT EVERY 5 MINUTES. AFTER 3 CALL 911. Patient not taking: Reported on 03/13/2022 02/27/22   Arnoldo Lenis, MD  pantoprazole (PROTONIX) 40 MG tablet TAKE ONE TABLET BY MOUTH 2 TIMES A DAY BEFORE A MEAL Patient not taking: Reported on 03/13/2022 11/18/21   Mahala Menghini, PA-C  spironolactone (ALDACTONE) 25 MG tablet TAKE 1 TABLET BY MOUTH ONCE A DAY. Patient not taking: Reported on 03/13/2022 02/10/22   Arnoldo Lenis, MD    Current Facility-Administered Medications  Medication Dose Route Frequency Provider Last Rate Last Admin   acetaminophen (TYLENOL) tablet 650 mg  650 mg Oral Q6H PRN Emokpae, Ejiroghene E, MD       Or   acetaminophen (TYLENOL) suppository 650 mg  650 mg Rectal Q6H PRN Emokpae, Ejiroghene E, MD       furosemide (LASIX) injection 40 mg  40 mg Intravenous Q12H Emokpae, Ejiroghene E, MD   40 mg at 03/13/22 0816   insulin aspart (novoLOG) injection 0-9 Units  0-9 Units Subcutaneous Q6H Emokpae, Ejiroghene E,  MD   3 Units at 03/13/22 0609   ipratropium-albuterol (DUONEB) 0.5-2.5 (3) MG/3ML nebulizer solution 3 mL  3 mL Nebulization Q4H PRN Emokpae, Ejiroghene E, MD       ipratropium-albuterol (DUONEB) 0.5-2.5 (3) MG/3ML nebulizer solution 3 mL  3 mL Nebulization TID Emokpae, Ejiroghene E, MD   3 mL at 03/13/22 0829   ondansetron (ZOFRAN) tablet 4 mg  4 mg Oral Q6H PRN Emokpae, Ejiroghene E, MD       Or   ondansetron (ZOFRAN) injection 4 mg  4 mg Intravenous Q6H PRN Emokpae, Ejiroghene E, MD       pantoprazole (PROTONIX) injection 40 mg  40 mg Intravenous Q12H Emokpae, Ejiroghene E, MD   40 mg at 03/13/22 0814   polyethylene glycol (MIRALAX / GLYCOLAX) packet 17 g  17 g Oral Daily PRN Emokpae, Ejiroghene E, MD        Allergies as of 03/12/2022 - Review Complete 03/12/2022  Allergen Reaction Noted   Caduet [amlodipine-atorvastatin] Swelling 09/18/2015    Family History  Problem Relation Age of Onset   Diabetes Mother    Heart attack Mother    Heart attack Father    Heart attack Brother    Colon cancer Neg Hx    Stomach cancer Neg Hx     Social History   Socioeconomic History   Marital status: Divorced    Spouse name: Not on file   Number of children: 2   Years of education: Not on file   Highest education level: High school graduate  Occupational History   Occupation: Retired  Tobacco Use   Smoking status: Every Day    Packs/day: 2.00    Types: Cigarettes   Smokeless tobacco: Never  Vaping Use   Vaping Use: Never used  Substance and Sexual Activity   Alcohol use: Yes    Comment: very rare   Drug use: No   Sexual activity: Not on file  Other Topics Concern   Not on file  Social History Narrative   Not on file   Social Determinants of Health   Financial Resource Strain: Low Risk  (08/06/2020)   Overall Financial Resource Strain (CARDIA)    Difficulty of Paying Living Expenses: Not very hard  Food Insecurity: No Food Insecurity (08/02/2020)   Hunger Vital Sign     Worried About Running Out of Food in the Last Year: Never true    Ran Out of Food in  the Last Year: Never true  Transportation Needs: No Transportation Needs (08/02/2020)   PRAPARE - Hydrologist (Medical): No    Lack of Transportation (Non-Medical): No  Physical Activity: Inactive (08/06/2020)   Exercise Vital Sign    Days of Exercise per Week: 0 days    Minutes of Exercise per Session: 0 min  Stress: No Stress Concern Present (08/06/2020)   [place]    Feeling of Stress : Not at all  Social Connections: Socially Isolated (08/06/2020)   Social Connection and Isolation Panel [NHANES]    Frequency of Communication with Friends and Family: Three times a week    Frequency of Social Gatherings with Friends and Family: Three times a week    Attends Religious Services: Never    Active Member of Clubs or Organizations: No    Attends Archivist Meetings: Never    Marital Status: Divorced  Human resources officer Violence: Unknown (08/06/2020)   Humiliation, Afraid, Rape, and Kick questionnaire    Fear of Current or Ex-Partner: No    Emotionally Abused: No    Physically Abused: No    Sexually Abused: Not on file     Review of Systems   Gen: Denies any fever, chills, loss of appetite, change in weight or weight loss CV: Denies chest pain, heart palpitations, syncope, edema  Resp: Denies shortness of breath with rest, cough, wheezing, coughing up blood, and pleurisy. GI: See HPI GU : Denies urinary burning, blood in urine, urinary frequency, and urinary incontinence. MS: Denies joint pain, limitation of movement, swelling, cramps, and atrophy.  Derm: Denies rash, itching, dry skin, hives. Psych: Denies depression, anxiety, memory loss, hallucinations, and confusion. Heme: Denies bruising or bleeding Neuro:  Denies any headaches, dizziness, paresthesias, shaking  Physical Exam   Vital  Signs in last 24 hours: Temp:  [97.5 F (36.4 C)-98.7 F (37.1 C)] 98.3 F (36.8 C) (06/30 0546) Pulse Rate:  [82-101] 94 (06/30 0546) Resp:  [18-22] 20 (06/30 0546) BP: (114-143)/(48-73) 130/48 (06/30 0546) SpO2:  [88 %-100 %] 98 % (06/30 0832) Weight:  [135 kg] 135 kg (06/29 1329) Last BM Date : 03/12/22  General:   Alert,  Well-developed, well-nourished, pleasant and cooperative in NAD Head:  Normocephalic and atraumatic. Eyes:  Sclera clear, no icterus.   Conjunctiva pink. Ears:  Normal auditory acuity. Lungs: On 4 L nasal cannula. Bilateral expiratory wheezing noted throughout all lung fields.  No increased work of breathing. Heart:  Regular rate and rhythm; no murmurs, clicks, rubs,  or gallops. Abdomen: Soft, nontender and nondistended. No masses, hepatosplenomegaly or hernias noted. Normal bowel sounds, without guarding, and without rebound.   Rectal: deferred   Msk:  Symmetrical without gross deformities. Normal posture. Neurologic:  Alert and  oriented x4. Skin:  Intact without significant lesions or rashes. Psych:  Alert and cooperative. Normal mood and affect.  Intake/Output from previous day: 06/29 0701 - 06/30 0700 In: 1048 [P.O.:120; I.V.:206.7; IWPYK:998; IV Piggyback:33.3] Out: 2150 [Urine:2150] Intake/Output this shift: Total I/O In: 344 [Blood:344] Out: -    Labs/Studies   Recent Labs Recent Labs    03/12/22 1424 03/13/22 0505  WBC 9.6 10.8*  HGB 5.7* 8.0*  HCT 22.8* 29.4*  PLT 205 222   BMET Recent Labs    03/12/22 1424 03/13/22 0505  NA 136 136  K 4.9 4.4  CL 98 93*  CO2 32 31  GLUCOSE 91 237*  BUN 15  24*  CREATININE 1.23 1.40*  CALCIUM 8.7* 9.0   LFT Recent Labs    03/12/22 1424  PROT 6.4*  ALBUMIN 3.3*  AST 13*  ALT 8  ALKPHOS 76  BILITOT 0.5   PT/INR No results for input(s): "LABPROT", "INR" in the last 72 hours. Hepatitis Panel No results for input(s): "HEPBSAG", "HCVAB", "HEPAIGM", "HEPBIGM" in the last 72  hours. C-Diff No results for input(s): "CDIFFTOX" in the last 72 hours.  Radiology/Studies CT HEAD WO CONTRAST (5MM)  Result Date: 03/12/2022 CLINICAL DATA:  Initial evaluation for altered mental status. EXAM: CT HEAD WITHOUT CONTRAST TECHNIQUE: Contiguous axial images were obtained from the base of the skull through the vertex without intravenous contrast. RADIATION DOSE REDUCTION: This exam was performed according to the departmental dose-optimization program which includes automated exposure control, adjustment of the mA and/or kV according to patient size and/or use of iterative reconstruction technique. COMPARISON:  Prior CT from 11/21/2012. FINDINGS: Brain: Cerebral volume within normal limits for age. Mild chronic microvascular ischemic disease noted involving the supratentorial cerebral white matter. No acute intracranial hemorrhage. No acute large vessel territory infarct. No mass lesion, mass effect or midline shift. No hydrocephalus or extra-axial fluid collection. Vascular: No abnormal hyperdense vessel. Skull: Scalp soft tissues and calvarium within normal limits. Sinuses/Orbits: Globes orbital soft tissues within normal limits. Paranasal sinuses are largely clear. Sequelae of prior mastoidectomy on the left. Small volume mucosal thickening about the mastoid bowl. Trace right mastoid effusion. Other: None. IMPRESSION: 1. No acute intracranial abnormality. 2. Mild chronic microvascular ischemic disease. Electronically Signed   By: Jeannine Boga M.D.   On: 03/12/2022 22:05   DG Chest Port 1 View  Result Date: 03/12/2022 CLINICAL DATA:  Shortness of breath. EXAM: PORTABLE CHEST 1 VIEW COMPARISON:  March 13, 22 FINDINGS: Mild streaky bibasilar opacities. No focal confluent consolidation. Similar enlarged cardiac silhouette. No acute osseous abnormality. IMPRESSION: 1. Mild streaky bibasilar opacities, favor atelectasis. Infection is not excluded. 2. Similar cardiomegaly. Electronically  Signed   By: Margaretha Sheffield M.D.   On: 03/12/2022 14:04     Assessment   Gregory Alvarado is a 71 y.o. 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  He is brought to the ED for unresponsiveness as he was found by his son.  On admission he stated that he did feel dizzy before he passed out but does not know how long he was out for.  Hemoglobin found to be 5.7 on admission, GI consulted for further evaluation and management of symptomatic anemia with known history of small bowel AVMs.   History of small bowel AVMs/Acute on chronic anemia: Presented to the ED with hemoglobin 5.7 with microcytic indices.  He was given 2 units PRBCs and started on IV Protonix twice daily.  Hemoglobin improved to 8 this morning.  He was to have repeat CBC and GI follow-up post last hospitalization but neither was completed.  He has had prior upper endoscopies with enteroscopy showed multiple angioectasias in the duodenum and jejunum s/p APC therapy.  He was previously on aspirin and Effient after cardiac stent placement in December 2021 but is currently only on aspirin.  Last EGD 11/26/2021 showing 1 recent bleeding angiodysplastic lesion in the stomach, 6 nonbleeding lesions in the duodenum, and 6 nonbleeding lesions in the jejunum all treated with APC therapy.  He has been on PPI twice daily  at home.  He states has been having dark stools since his prior admission in March, they have not improved to a normal brown color.  He has not been on any oral iron at home, denies frequent NSAID use, very rare alcohol use.  Given increased frequency of anemia, he may need subcutaneous octreotide on discharge.  We will proceed with EGD/enteroscopy with Dr. Jenetta Downer today.  I have discussed the risks, alternatives, benefits with regards to but not limited to the risk of reaction to medication, bleeding, infection,  perforation and the patient is agreeable to proceed. Written consent to be obtained.  Chronic GERD/history of H. Pylori: EGD in August 2021 noting previously healed gastric ulcer.  This pathology was consistent with H. pylori.  He was prescribed quadruple therapy with doxycycline in place of tetracycline and took therapy as directed.  He has not had any documented history of eradication of H. pylori.  He has been on PPI twice daily at home as directed without breakthrough symptoms.  COPD: Patient is on baseline 4 L oxygen at home.  Not currently using any BiPAP or CPAP at home.  He denies any recent worsening shortness of breath above his baseline.  Acute metabolic encephalopathy: Patient initially lethargic and dizzy on arrival.  Mental status has improved, patient alert and oriented x4.  CT head without contrast with no acute intracranial abnormality.  Plan / Recommendations    N.p.o. EGD/enteroscopy with Dr. Jenetta Downer today Hold aspirin Continue IV PPI twice daily May need to consider octreotide subq on discharge. Continue to monitor H/H, transfuse for hemoglobin less than 7 Needs follow-up H. pylori eradication testing. Please follow-up for outpatient colonoscopy     03/13/2022, 9:35 AM  Venetia Night, MSN, FNP-BC, AGACNP-BC [facility]

## 2022-03-13 NOTE — Plan of Care (Signed)
  Problem: Education: Goal: Knowledge of General Education information will improve Description Including pain rating scale, medication(s)/side effects and non-pharmacologic comfort measures Outcome: Progressing   Problem: Health Behavior/Discharge Planning: Goal: Ability to manage health-related needs will improve Outcome: Progressing   

## 2022-03-13 NOTE — Inpatient Diabetes Management (Signed)
Inpatient Diabetes Program Recommendations  AACE/ADA: New Consensus Statement on Inpatient Glycemic Control (2015)  Target Ranges:  Prepandial:   less than 140 mg/dL      Peak postprandial:   less than 180 mg/dL (1-2 hours)      Critically ill patients:  140 - 180 mg/dL   Lab Results  Component Value Date   GLUCAP 243 (H) 03/13/2022   HGBA1C 6.4 (H) 11/25/2021    Latest Reference Range & Units 11/27/21 11:39 03/12/22 15:01 03/12/22 19:49 03/13/22 00:13 03/13/22 05:52  Glucose-Capillary 70 - 99 mg/dL 198 (H) 76 116 (H) 211 (H) 243 (H)  (H): Data is abnormally high Review of Glycemic Control  Diabetes history: type 2 Outpatient Diabetes medications: Novolin 70/30 insulin 70 units every am and HS Current orders for Inpatient glycemic control: Novolog 0-9 units correction scale every 6 hours.   Inpatient Diabetes Program Recommendations:   Noted that blood sugars have been greater than 180 mg/dl today. If blood sugars continue to be elevated, may need to consider changing Novolog correction scale to every 4 hours while NPO.   Harvel Ricks RN BSN CDE Diabetes Coordinator Pager: [phone]  8am-5pm

## 2022-03-13 NOTE — Transfer of Care (Signed)
Immediate Anesthesia Transfer of Care Note  Patient: Gregory Alvarado  Procedure(s) Performed: ESOPHAGOGASTRODUODENOSCOPY (EGD) WITH PROPOFOL ENTEROSCOPY  Patient Location: PACU  Anesthesia Type:General  Level of Consciousness: sedated  Airway & Oxygen Therapy: Patient Spontanous Breathing and Patient connected to nasal cannula oxygen  Post-op Assessment: Report given to RN, Post -op Vital signs reviewed and stable and Patient moving all extremities X 4  Post vital signs: Reviewed and stable  Last Vitals:  Vitals Value Taken Time  BP 127/50 03/13/22 1352  Temp    Pulse 117 03/13/22 1352  Resp 26 03/13/22 1352  SpO2 95 % 03/13/22 1352  Vitals shown include unvalidated device data.  Last Pain:  Vitals:   03/13/22 1311  TempSrc: Oral  PainSc: 0-No pain      Patients Stated Pain Goal: 5 (48/30/73 5430)  Complications: No notable events documented.

## 2022-03-13 NOTE — Interval H&P Note (Signed)
History and Physical Interval Note:  03/13/2022 1:24 PM  Gregory Alvarado  has presented today for surgery, with the diagnosis of history of small bowel AVMs, melena, symptomatic anemia.  The various methods of treatment have been discussed with the patient and family. After consideration of risks, benefits and other options for treatment, the patient has consented to  Procedure(s): ESOPHAGOGASTRODUODENOSCOPY (EGD) WITH PROPOFOL (N/A) ENTEROSCOPY (N/A) as a surgical intervention.  The patient's history has been reviewed, patient examined, no change in status, stable for surgery.  I have reviewed the patient's chart and labs.  Questions were answered to the patient's satisfaction.     [name]

## 2022-03-13 NOTE — Op Note (Addendum)
[facility] Patient Name: Gregory Alvarado Procedure Date: 03/13/2022 1:27 PM MRN: [id number] Date of Birth: 19-Mar-1951 Attending MD: Elon Alas. Abbey Chatters DO CSN: [id number] Age: 70 Admit Type: Outpatient Procedure:                Upper GI endoscopy/Small bowel enteroscopy Indications:               Providers:                Elon Alas. Abbey Chatters, DO, Lurline Del, RN, Kristine L.                            Risa Grill, Technician, Everardo Pacific Referring MD:              Medicines:                See the Anesthesia note for documentation of the                            administered medications Complications:            No immediate complications. Estimated Blood Loss:     Estimated blood loss: none. Procedure:                Pre-Anesthesia Assessment:                           - The anesthesia plan was to use monitored                            anesthesia care (MAC).                           After obtaining informed consent, the endoscope was                            passed under direct vision. Throughout the                            procedure, the patient's blood pressure, pulse, and                            oxygen saturations were monitored continuously. The                            [id number]) scope was introduced through                            the mouth, and advanced to the jejunum. The upper                            GI endoscopy was accomplished without difficulty.                            The patient tolerated the procedure well. Scope In: 1:31:44 PM Scope Out: 1:46:47 PM Total Procedure Duration: 0 hours 15 minutes 3 seconds  Findings:      The examined esophagus was normal.  A single small angiodysplastic lesion with no bleeding was found in the       stomach. Coagulation for bleeding prevention using argon plasma was       successful.      The duodenal bulb, first portion of the duodenum, second portion of the       duodenum, third portion of  the duodenum and fourth portion of the       duodenum were normal.      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 Coagulation for bleeding prevention using argon plasma was       successful. Impression:               - Normal esophagus.                           - A single non-bleeding angiodysplastic lesion in                            the stomach. Treated with argon plasma coagulation                            (APC).                           - Normal duodenal bulb, first portion of the                            duodenum, second portion of the duodenum, third                            portion of the duodenum and fourth portion of the                            duodenum.                           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 Treated with argon plasma coagulation                            (APC).                           - No specimens collected. Moderate Sedation:      Per Anesthesia Care Recommendation:           - Return patient to hospital [name] for ongoing care.                           - Soft diet.                           - Continue to monitor Hgb and transfuse for <7.  Need to update colonoscopy in outpatient setting.                            COntinue on Iron. Procedure Code(s):        --- Professional ---                           [phone], Esophagogastroduodenoscopy, flexible,                            transoral; with control of bleeding, any method Diagnosis Code(s):        --- Professional ---                           P95.093, Angiodysplasia of stomach and duodenum                            without bleeding                           K55.20, Angiodysplasia of colon without hemorrhage CPT copyright 2019 American Medical Association. All rights reserved. The codes documented in this report are preliminary and upon coder review may  be revised  to meet current compliance requirements. Elon Alas. Abbey Chatters, DO [place] Abbey Chatters, DO 03/13/2022 1:52:27 PM This report has been signed electronically. Number of Addenda: 0

## 2022-03-13 NOTE — Progress Notes (Signed)
[facility]                                                                               Gregory Alvarado, is a 71 y.o. male, DOB - [birth year], PRF:[id number] Admit date - 03/12/2022    Outpatient Primary MD for the patient is Gregory Squibb, MD  LOS - 1  days    Brief summary     Gregory Alvarado is a 71 y.o. male with medical history significant for COPD, HTN, DM, chronic respiratory failure on 4 L and BIPAP QHS, systolic and diastolic CHF. Patient was brought to the ED with reports of unresponsiveness.  As per the patient, he had been having rectal bleeding since 4 days. Patient was admitted for evaluation of acute GI bleed and hypoglycemia.  Assessment & Plan    Assessment and Plan: * Acute metabolic encephalopathy Probably secondary to acute blood loss from GI bleed. Patient is currently alert and oriented    Symptomatic anemia Hemoglobin 5.7, last check during at recent hospitalization was 7.4.  Patient reports bleeding per rectum within the last 3 to 4 days. Gastroenterology consulted and patient is scheduled for EGD/enteroscopy by Dr. Jenetta Downer today.   He underwent 2 units of PRBC transfusion and repeat hemoglobin is around 8.  Continue with PPI twice daily.   Chronic respiratory failure on 4 L of nasal cannula oxygen at home COPD (chronic obstructive pulmonary disease) (HCC) Chest x-ray showed atelectasis Patient is afebrile, mild leukocytosis Continue with BiPAP at night  Continue with bronchodilators as needed. On exam no wheezing heard at this time. Therapy evaluations to be scheduled tomorrow.  Syncope Probably secondary to acute blood loss from GI bleed. Check orthostatic vital signs tomorrow.   Acute on chronic combined systolic and diastolic CHF (congestive heart failure) ([place]) Last echocardiogram from December 2022 showed left ventricular ejection fraction of 55% with regional wall abnormalities and grade 1 diastolic dysfunction. Patient  started on IV Lasix 40 mg twice daily,  and monitor urine output. Patient has diuresed about 1.8 L since admission. Creatinine has been around 1.2 worsened to 1.4. Decrease Lasix from 40 mg IV twice daily to 40 mg daily. Continue with strict intake and output and daily weights.   Insulin dependent type 2 diabetes mellitus (HCC) Last hemoglobin A1c around 6.4 Continue with sliding scale insulin while inpatient CBG (last 3)  Recent Labs    03/12/22 1949 03/13/22 0013 03/13/22 0552  GLUCAP 116* 211* 243*    Hyperlipidemia Continue with Lipitor on discharge.  Essential hypertension Blood pressure parameters are optimal at this time.  AKI Probably secondary to blood loss.    Leukocytosis Probably secondary to steroid injection given yesterday.    Estimated body mass index is 40.36 kg/m as calculated from the following:   Height as of this encounter: 6' (1.829 m).   Weight as of this encounter: 135 kg.  Code Status: Full code DVT Prophylaxis:  SCDs Start: 03/12/22 1844   Level of Care: Level of care: Telemetry Family Communication: None at bedside  Disposition Plan:     Remains inpatient appropriate:  pending further work up .   Procedures:  EGD/ ENTEROSCOPY scheduled  today.  Consultants:   gastroenterology  Antimicrobials:   Anti-infectives (From admission, onward)    None        Medications  Scheduled Meds:  furosemide  40 mg Intravenous Q12H   insulin aspart  0-9 Units Subcutaneous Q6H   ipratropium-albuterol  3 mL Nebulization TID   pantoprazole (PROTONIX) IV  40 mg Intravenous Q12H   Continuous Infusions: PRN Meds:.acetaminophen **OR** acetaminophen, ipratropium-albuterol, ondansetron **OR** ondansetron (ZOFRAN) IV, polyethylene glycol    Subjective:   Gregory Alvarado was seen and examined today.  NO nausea or vomiting. To remain NPO.    Objective:   Vitals:   03/12/22 2249 03/13/22 0125 03/13/22 0546 03/13/22 0832  BP: (!) 139/58 (!)  125/52 (!) 130/48   Pulse: 91 92 94   Resp: '19 20 20   '$ Temp: (!) 97.5 F (36.4 C) (!) 97.5 F (36.4 C) 98.3 F (36.8 C)   TempSrc: Oral Oral    SpO2: 96% 95% 93% 98%  Weight:      Height:        Intake/Output Summary (Last 24 hours) at 03/13/2022 1026 Last data filed at 03/13/2022 0900 Gross per 24 hour  Intake 1392 ml  Output 3250 ml  Net -1858 ml   Filed Weights   03/12/22 1329  Weight: 135 kg     Exam General exam: Appears calm and comfortable  Respiratory system: Clear to auscultation. Respiratory effort normal. Cardiovascular system: S1 & S2 heard, RRR. Pedal edema.   Gastrointestinal system: Abdomen is nondistended, soft and nontender. Normal bowel sounds heard. Central nervous system: Alert and oriented. No focal neurological deficits. Extremities: Symmetric 5 x 5 power. Skin: No rashes, lesions or ulcers Psychiatry: Mood & affect appropriate.     Data Reviewed:  I have personally reviewed following labs and imaging studies   CBC Lab Results  Component Value Date   WBC 10.8 (H) 03/13/2022   RBC 3.87 (L) 03/13/2022   HGB 8.0 (L) 03/13/2022   HCT 29.4 (L) 03/13/2022   MCV 76.0 (L) 03/13/2022   MCH 20.7 (L) 03/13/2022   PLT 222 03/13/2022   MCHC 27.2 (L) 03/13/2022   RDW 18.8 (H) 03/13/2022   LYMPHSABS 0.7 03/12/2022   MONOABS 0.5 03/12/2022   EOSABS 0.0 03/12/2022   BASOSABS 0.0 51/10/5850     Last metabolic panel Lab Results  Component Value Date   NA 136 03/13/2022   K 4.4 03/13/2022   CL 93 (L) 03/13/2022   CO2 31 03/13/2022   BUN 24 (H) 03/13/2022   CREATININE 1.40 (H) 03/13/2022   GLUCOSE 237 (H) 03/13/2022   GFRNONAA 55 (L) 03/13/2022   GFRAA >60 04/12/2020   CALCIUM 9.0 03/13/2022   PHOS 3.0 11/25/2021   PROT 6.4 (L) 03/12/2022   ALBUMIN 3.3 (L) 03/12/2022   LABGLOB 3.2 08/07/2020   AGRATIO 1.1 08/07/2020   BILITOT 0.5 03/12/2022   ALKPHOS 76 03/12/2022   AST 13 (L) 03/12/2022   ALT 8 03/12/2022   ANIONGAP 12 03/13/2022     CBG (last 3)  Recent Labs    03/12/22 1949 03/13/22 0013 03/13/22 0552  GLUCAP 116* 211* 243*      Coagulation Profile: No results for input(s): "INR", "PROTIME" in the last 168 hours.   Radiology Studies: CT HEAD WO CONTRAST (5MM)  Result Date: 03/12/2022 CLINICAL DATA:  Initial evaluation for altered mental status. EXAM: CT HEAD WITHOUT CONTRAST TECHNIQUE: Contiguous axial images were obtained from the base of the skull through the vertex without intravenous  contrast. RADIATION DOSE REDUCTION: This exam was performed according to the departmental dose-optimization program which includes automated exposure control, adjustment of the mA and/or kV according to patient size and/or use of iterative reconstruction technique. COMPARISON:  Prior CT from 11/21/2012. FINDINGS: Brain: Cerebral volume within normal limits for age. Mild chronic microvascular ischemic disease noted involving the supratentorial cerebral white matter. No acute intracranial hemorrhage. No acute large vessel territory infarct. No mass lesion, mass effect or midline shift. No hydrocephalus or extra-axial fluid collection. Vascular: No abnormal hyperdense vessel. Skull: Scalp soft tissues and calvarium within normal limits. Sinuses/Orbits: Globes orbital soft tissues within normal limits. Paranasal sinuses are largely clear. Sequelae of prior mastoidectomy on the left. Small volume mucosal thickening about the mastoid bowl. Trace right mastoid effusion. Other: None. IMPRESSION: 1. No acute intracranial abnormality. 2. Mild chronic microvascular ischemic disease. Electronically Signed   By: Jeannine Boga M.D.   On: 03/12/2022 22:05   DG Chest Port 1 View  Result Date: 03/12/2022 CLINICAL DATA:  Shortness of breath. EXAM: PORTABLE CHEST 1 VIEW COMPARISON:  March 13, 22 FINDINGS: Mild streaky bibasilar opacities. No focal confluent consolidation. Similar enlarged cardiac silhouette. No acute osseous abnormality.  IMPRESSION: 1. Mild streaky bibasilar opacities, favor atelectasis. Infection is not excluded. 2. Similar cardiomegaly. Electronically Signed   By: Margaretha Sheffield M.D.   On: 03/12/2022 14:04       Hosie Poisson M.D. [facility] 03/13/2022, 10:26 AM  Available via Epic secure chat 7am-7pm After 7 pm, please refer to night coverage provider listed on amion.

## 2022-03-13 NOTE — Anesthesia Preprocedure Evaluation (Signed)
Anesthesia Evaluation  Patient identified by MRN, date of birth, ID band Patient awake    Reviewed: Allergy & Precautions, H&P , NPO status , Patient's Chart, lab work & pertinent test results, reviewed documented beta blocker date and time   Airway Mallampati: II  TM Distance: >3 FB Neck ROM: full    Dental no notable dental hx.    Pulmonary COPD, Current Smoker and Patient abstained from smoking.,    Pulmonary exam normal breath sounds clear to auscultation       Cardiovascular Exercise Tolerance: Good hypertension, + angina + CAD, + Cardiac Stents and +CHF   Rhythm:regular Rate:Normal     Neuro/Psych negative neurological ROS  negative psych ROS   GI/Hepatic Neg liver ROS, PUD,   Endo/Other  negative endocrine ROSdiabetes  Renal/GU CRFRenal disease  negative genitourinary   Musculoskeletal   Abdominal   Peds  Hematology  (+) Blood dyscrasia, anemia ,   Anesthesia Other Findings   Reproductive/Obstetrics negative OB ROS                             Anesthesia Physical  Anesthesia Plan  ASA: 4 and emergent  Anesthesia Plan: General   Post-op Pain Management:    Induction:   PONV Risk Score and Plan: Propofol infusion  Airway Management Planned:   Additional Equipment:   Intra-op Plan:   Post-operative Plan:   Informed Consent: I have reviewed the patients History and Physical, chart, labs and discussed the procedure including the risks, benefits and alternatives for the proposed anesthesia with the patient or authorized representative who has indicated his/her understanding and acceptance.     Dental Advisory Given  Plan Discussed with: CRNA  Anesthesia Plan Comments:         Anesthesia Quick Evaluation

## 2022-03-13 NOTE — Progress Notes (Signed)
Patient Alert with moments of confusion. Patient received 2 Units of PRBC with not complications. The rest of the night the patient slept. Continued to monitor during the night and put nasal cannula back in place on patient.

## 2022-03-13 NOTE — TOC Progression Note (Signed)
  Transition of Care [facility]) Screening Note   Patient Details  Name: Gregory Alvarado Date of Birth: 23-Sep-1950   [facility] of [facility] CM/SW Contact:    Boneta Lucks, RN Phone Number: 03/13/2022, 12:40 PM    Transition of Care Department [facility]) has reviewed patient and no TOC needs have been identified at this time. We will continue to monitor patient advancement through interdisciplinary progression rounds. If new patient transition needs arise, please place a TOC consult.      Barriers to Discharge: Continued Medical Work up

## 2022-03-13 NOTE — Progress Notes (Signed)
Pt request med for sleep, hx of COPD admitted with AMS, on home O2 3l refused Bipap, takes home ambien 5 mg. MD made aware and order received for Melatonin. SRP, RN

## 2022-03-13 NOTE — Telephone Encounter (Signed)
Gregory Alvarado -please arrange hospital follow-up for this patient in 3 to 4 weeks.  Tammy or Dena -patient seen in the hospital for anemia with small bowel AVMs.  Please arrange repeat CBC in 1 week for the patient.   Venetia Night, MSN, FNP-BC, AGACNP-BC [facility]

## 2022-03-13 NOTE — Anesthesia Preprocedure Evaluation (Signed)
Anesthesia Evaluation  Patient identified by MRN, date of birth, ID band Patient awake    Reviewed: Allergy & Precautions, H&P , NPO status , Patient's Chart, lab work & pertinent test results, reviewed documented beta blocker date and time   Airway Mallampati: II  TM Distance: >3 FB Neck ROM: full    Dental no notable dental hx.    Pulmonary COPD, Current Smoker and Patient abstained from smoking.,    Pulmonary exam normal breath sounds clear to auscultation       Cardiovascular Exercise Tolerance: Good hypertension, + angina + CAD, + Cardiac Stents and +CHF   Rhythm:regular Rate:Normal     Neuro/Psych negative neurological ROS  negative psych ROS   GI/Hepatic Neg liver ROS, PUD,   Endo/Other  negative endocrine ROSdiabetes  Renal/GU CRFRenal disease  negative genitourinary   Musculoskeletal   Abdominal   Peds  Hematology  (+) Blood dyscrasia, anemia ,   Anesthesia Other Findings   Reproductive/Obstetrics negative OB ROS                             Anesthesia Physical Anesthesia Plan  ASA: 4 and emergent  Anesthesia Plan: General   Post-op Pain Management:    Induction:   PONV Risk Score and Plan: Propofol infusion  Airway Management Planned:   Additional Equipment:   Intra-op Plan:   Post-operative Plan:   Informed Consent: I have reviewed the patients History and Physical, chart, labs and discussed the procedure including the risks, benefits and alternatives for the proposed anesthesia with the patient or authorized representative who has indicated his/her understanding and acceptance.     Dental Advisory Given  Plan Discussed with: CRNA  Anesthesia Plan Comments:         Anesthesia Quick Evaluation

## 2022-03-14 DIAGNOSIS — J9611 Chronic respiratory failure with hypoxia: Secondary | ICD-10-CM

## 2022-03-14 DIAGNOSIS — I998 Other disorder of circulatory system: Secondary | ICD-10-CM

## 2022-03-14 DIAGNOSIS — I2510 Atherosclerotic heart disease of native coronary artery without angina pectoris: Secondary | ICD-10-CM

## 2022-03-14 DIAGNOSIS — K5521 Angiodysplasia of colon with hemorrhage: Secondary | ICD-10-CM

## 2022-03-14 DIAGNOSIS — D62 Acute posthemorrhagic anemia: Secondary | ICD-10-CM

## 2022-03-14 DIAGNOSIS — J9612 Chronic respiratory failure with hypercapnia: Secondary | ICD-10-CM

## 2022-03-14 LAB — CBC WITH DIFFERENTIAL/PLATELET
Abs Immature Granulocytes: 0.07 10*3/uL (ref 0.00–0.07)
Basophils Absolute: 0 10*3/uL (ref 0.0–0.1)
Basophils Relative: 0 %
Eosinophils Absolute: 0 10*3/uL (ref 0.0–0.5)
Eosinophils Relative: 0 %
HCT: 26.3 % — ABNORMAL LOW (ref 39.0–52.0)
Hemoglobin: 7.3 g/dL — ABNORMAL LOW (ref 13.0–17.0)
Immature Granulocytes: 1 %
Lymphocytes Relative: 5 %
Lymphs Abs: 0.6 10*3/uL — ABNORMAL LOW (ref 0.7–4.0)
MCH: 20.8 pg — ABNORMAL LOW (ref 26.0–34.0)
MCHC: 27.8 g/dL — ABNORMAL LOW (ref 30.0–36.0)
MCV: 74.9 fL — ABNORMAL LOW (ref 80.0–100.0)
Monocytes Absolute: 0.7 10*3/uL (ref 0.1–1.0)
Monocytes Relative: 5 %
Neutro Abs: 11 10*3/uL — ABNORMAL HIGH (ref 1.7–7.7)
Neutrophils Relative %: 89 %
Platelets: 197 10*3/uL (ref 150–400)
RBC: 3.51 MIL/uL — ABNORMAL LOW (ref 4.22–5.81)
RDW: 19.9 % — ABNORMAL HIGH (ref 11.5–15.5)
WBC: 12.4 10*3/uL — ABNORMAL HIGH (ref 4.0–10.5)
nRBC: 0 % (ref 0.0–0.2)

## 2022-03-14 LAB — BASIC METABOLIC PANEL
Anion gap: 8 (ref 5–15)
BUN: 24 mg/dL — ABNORMAL HIGH (ref 8–23)
CO2: 35 mmol/L — ABNORMAL HIGH (ref 22–32)
Calcium: 8.8 mg/dL — ABNORMAL LOW (ref 8.9–10.3)
Chloride: 92 mmol/L — ABNORMAL LOW (ref 98–111)
Creatinine, Ser: 1.15 mg/dL (ref 0.61–1.24)
GFR, Estimated: >60 mL/min (ref >60)
Glucose, Bld: 210 mg/dL — ABNORMAL HIGH (ref 70–99)
Potassium: 3.7 mmol/L (ref 3.5–5.1)
Sodium: 135 mmol/L (ref 135–145)

## 2022-03-14 LAB — GLUCOSE, CAPILLARY
Glucose-Capillary: 165 mg/dL — ABNORMAL HIGH (ref 70–99)
Glucose-Capillary: 200 mg/dL — ABNORMAL HIGH (ref 70–99)
Glucose-Capillary: 218 mg/dL — ABNORMAL HIGH (ref 70–99)
Glucose-Capillary: 246 mg/dL — ABNORMAL HIGH (ref 70–99)
Glucose-Capillary: 274 mg/dL — ABNORMAL HIGH (ref 70–99)
Glucose-Capillary: 309 mg/dL — ABNORMAL HIGH (ref 70–99)

## 2022-03-14 MED ORDER — ATORVASTATIN CALCIUM 40 MG PO TABS
40.0000 mg | ORAL_TABLET | Freq: Every day | ORAL | Status: DC
  Administered 2022-03-14 – 2022-03-15 (×2): 40 mg via ORAL
  Filled 2022-03-14 (×2): qty 1

## 2022-03-14 MED ORDER — METOPROLOL SUCCINATE ER 50 MG PO TB24
50.0000 mg | ORAL_TABLET | Freq: Every day | ORAL | Status: DC
  Administered 2022-03-14 – 2022-03-15 (×2): 50 mg via ORAL
  Filled 2022-03-14 (×2): qty 1

## 2022-03-14 MED ORDER — INSULIN GLARGINE-YFGN 100 UNIT/ML ~~LOC~~ SOLN
5.0000 [IU] | Freq: Every day | SUBCUTANEOUS | Status: DC
Start: 2022-03-14 — End: 2022-03-15
  Administered 2022-03-14 – 2022-03-15 (×2): 5 [IU] via SUBCUTANEOUS
  Filled 2022-03-14 (×3): qty 0.05

## 2022-03-14 MED ORDER — MELATONIN 3 MG PO TABS
6.0000 mg | ORAL_TABLET | Freq: Once | ORAL | Status: AC
  Administered 2022-03-14: 6 mg via ORAL
  Filled 2022-03-14: qty 2

## 2022-03-14 MED ORDER — NA FERRIC GLUC CPLX IN SUCROSE 12.5 MG/ML IV SOLN
250.0000 mg | Freq: Once | INTRAVENOUS | Status: AC
  Administered 2022-03-14: 250 mg via INTRAVENOUS
  Filled 2022-03-14: qty 20

## 2022-03-14 MED ORDER — FOLIC ACID 1 MG PO TABS
1.0000 mg | ORAL_TABLET | Freq: Every day | ORAL | Status: DC
  Administered 2022-03-14 – 2022-03-15 (×2): 1 mg via ORAL
  Filled 2022-03-14 (×2): qty 1

## 2022-03-14 MED ORDER — INSULIN ASPART 100 UNIT/ML IJ SOLN
0.0000 [IU] | Freq: Every day | INTRAMUSCULAR | Status: DC
  Administered 2022-03-14: 3 [IU] via SUBCUTANEOUS

## 2022-03-14 MED ORDER — INSULIN ASPART 100 UNIT/ML IJ SOLN
0.0000 [IU] | Freq: Three times a day (TID) | INTRAMUSCULAR | Status: DC
  Administered 2022-03-14: 3 [IU] via SUBCUTANEOUS
  Administered 2022-03-14: 5 [IU] via SUBCUTANEOUS
  Administered 2022-03-15: 3 [IU] via SUBCUTANEOUS
  Administered 2022-03-15: 5 [IU] via SUBCUTANEOUS

## 2022-03-14 NOTE — Assessment & Plan Note (Signed)
Restart statin  

## 2022-03-14 NOTE — Progress Notes (Signed)
Subjective: Patient states he is doing well.  Tolerating diet.  Denies any melena hematochezia.  Hemoglobin stable.  Objective: Vital signs in last 24 hours: Temp:  [98 F (36.7 C)-98.6 F (37 C)] 98.6 F (37 C) (07/01 0451) Pulse Rate:  [74-119] 97 (07/01 0451) Resp:  [17-31] 17 (07/01 0451) BP: (101-154)/(44-65) 154/56 (07/01 0451) SpO2:  [92 %-100 %] 92 % (07/01 0753) Last BM Date : 03/13/22 General:   Alert and oriented, pleasant Head:  Normocephalic and atraumatic. Eyes:  No icterus, sclera clear. Conjuctiva pink.  Abdomen:  Bowel sounds present, soft, non-tender, non-distended. No HSM or hernias noted. No rebound or guarding. No masses appreciated  Msk:  Symmetrical without gross deformities. Normal posture. Extremities:  Without clubbing or edema. Neurologic:  Alert and  oriented x4;  grossly normal neurologically. Skin:  Warm and dry, intact without significant lesions.  Cervical Nodes:  No significant cervical adenopathy. Psych:  Alert and cooperative. Normal mood and affect.  Intake/Output from previous day: 06/30 0701 - 07/01 0700 In: 744 [I.V.:400; Blood:344] Out: 7867 [Urine:3850] Intake/Output this shift: Total I/O In: 460 [P.O.:460] Out: 1800 [Urine:1800]  Lab Results: Recent Labs    03/13/22 0505 03/13/22 1520 03/14/22 0556  WBC 10.8* 15.5* 12.4*  HGB 8.0* 7.6* 7.3*  HCT 29.4* 27.7* 26.3*  PLT 222 216 197   BMET Recent Labs    03/12/22 1424 03/13/22 0505 03/14/22 0556  NA 136 136 135  K 4.9 4.4 3.7  CL 98 93* 92*  CO2 32 31 35*  GLUCOSE 91 237* 210*  BUN 15 24* 24*  CREATININE 1.23 1.40* 1.15  CALCIUM 8.7* 9.0 8.8*   LFT Recent Labs    03/12/22 1424  PROT 6.4*  ALBUMIN 3.3*  AST 13*  ALT 8  ALKPHOS 76  BILITOT 0.5   PT/INR No results for input(s): "LABPROT", "INR" in the last 72 hours. Hepatitis Panel No results for input(s): "HEPBSAG", "HCVAB", "HEPAIGM", "HEPBIGM" in the last 72 hours.   Studies/Results: CT HEAD WO  CONTRAST (5MM)  Result Date: 03/12/2022 CLINICAL DATA:  Initial evaluation for altered mental status. EXAM: CT HEAD WITHOUT CONTRAST TECHNIQUE: Contiguous axial images were obtained from the base of the skull through the vertex without intravenous contrast. RADIATION DOSE REDUCTION: This exam was performed according to the departmental dose-optimization program which includes automated exposure control, adjustment of the mA and/or kV according to patient size and/or use of iterative reconstruction technique. COMPARISON:  Prior CT from 11/21/2012. FINDINGS: Brain: Cerebral volume within normal limits for age. Mild chronic microvascular ischemic disease noted involving the supratentorial cerebral white matter. No acute intracranial hemorrhage. No acute large vessel territory infarct. No mass lesion, mass effect or midline shift. No hydrocephalus or extra-axial fluid collection. Vascular: No abnormal hyperdense vessel. Skull: Scalp soft tissues and calvarium within normal limits. Sinuses/Orbits: Globes orbital soft tissues within normal limits. Paranasal sinuses are largely clear. Sequelae of prior mastoidectomy on the left. Small volume mucosal thickening about the mastoid bowl. Trace right mastoid effusion. Other: None. IMPRESSION: 1. No acute intracranial abnormality. 2. Mild chronic microvascular ischemic disease. Electronically Signed   By: Jeannine Boga M.D.   On: 03/12/2022 22:05   DG Chest Port 1 View  Result Date: 03/12/2022 CLINICAL DATA:  Shortness of breath. EXAM: PORTABLE CHEST 1 VIEW COMPARISON:  March 13, 22 FINDINGS: Mild streaky bibasilar opacities. No focal confluent consolidation. Similar enlarged cardiac silhouette. No acute osseous abnormality. IMPRESSION: 1. Mild streaky bibasilar opacities, favor atelectasis. Infection is not excluded. 2.  Similar cardiomegaly. Electronically Signed   By: Margaretha Sheffield M.D.   On: 03/12/2022 14:04    Assessment: *Acute on chronic anemia *Small  bowel AVMs *GI bleeding  Plan: Patient is status post small bowel enteroscopy 03/13/2022 with APC of 1 gastric AVM and 4 jejunal AVMs.  Hemoglobin stable today.  Patient denies any overt GI bleeding.  Continue on PPI twice daily.  Continue to monitor H&H and transfuse for less than 7.  Continue iron supplementation.  Needs to update colonoscopy in outpatient setting.  Patient seems averse to this.   Elon Alas. Abbey Chatters, D.O. Gastroenterology and Hepatology [facility]   LOS: 2 days    03/14/2022, 12:40 PM

## 2022-03-14 NOTE — Plan of Care (Signed)
  Problem: Acute Rehab PT Goals(only PT should resolve) Goal: Pt Will Ambulate Flowsheets (Taken 03/14/2022 1218) Pt will Ambulate:  > 125 feet  with modified independence  with rolling walker Goal: Pt Will Go Up/Down Stairs Flowsheets (Taken 03/14/2022 1218) Pt will Go Up / Down Stairs:  3-5 stairs  with rail(s)  with modified independence Goal: Pt/caregiver will Perform Home Exercise Program Flowsheets (Taken 03/14/2022 1218) Pt/caregiver will Perform Home Exercise Program:  For increased strengthening  For improved balance  Independently  12:19 PM, 03/14/22 Josue Hector PT DPT  Physical Therapist with [facility]  [phone]

## 2022-03-14 NOTE — Assessment & Plan Note (Signed)
BMI 40.36 Lifestyle modification

## 2022-03-14 NOTE — Assessment & Plan Note (Signed)
-  no Chest pain presently -EKG without concerning ischemic changes 09/12/20 cath: LM LIs, LAD prox 70% and 90% mid, LCX 80% prox with thrombus, OM2 85%, RCA small non dom 60%. LVEDP elevated but number not given,  - received DES to prox/mid LAD, DES to prox LCX, DES to OM2.  08/2020 echo: LVEF 35-40% grade II DDx, mild MR -Holding ASA temporarily

## 2022-03-14 NOTE — Evaluation (Signed)
Physical Therapy Evaluation Patient Details Name: Gregory Alvarado MRN: [id number] DOB: [birth year] Today's Date: 03/14/2022  History of Present Illness  Gregory Alvarado is a 71 y.o. male with medical history significant for COPD, HTN, DM, chronic respiratory failure on 4 L and BIPAP QHS, systolic and diastolic CHF.  Patient was brought to the ED with reports of unresponsiveness.  Patient's son found patient.  At the time of my evaluation, patient is awake, lethargic, but able to answer questions.  Patient reports he has been feeling dizzy before he passed out.  He does not know how long it was out.  He reports over the past 4 days he has been having a lot of blood in his stools.  He denies abdominal pain.  No vomiting of blood.  He is not aware of any black stools.  He denies NSAID use.  He is not on blood thinners.  He does not know if he hit his head.  He denies chest pain, reports chronic cough, and unchanged difficulty breathing.  He reports good oral intake.   Clinical Impression  Patient presents upright in recliner chair. He is awake, alert and cooperative. Patient is Mod I with bed mobility and transfers but reports unsteadiness or feeling "wobbly" in standing. He is able to stand without assist but requires UE support using RW for marching/ ambulating. Patient shows good safety awareness. He is able to ambulate 20 feet in room with SBA, but does note mild fatigue. Currently supplementing 3.5L O2. No reported distress though does show slight increased work of breathing with activity. Patient left seated in chair with phone and call bell in reach. Patient will benefit from continued physical therapy in hospital and recommended venue below to increase strength, balance, endurance for safe ADLs and gait.         Recommendations for follow up therapy are one component of a multi-disciplinary discharge planning process, led by the attending physician.  Recommendations may be updated based on patient status,  additional functional criteria and insurance authorization.  Follow Up Recommendations Home health PT      Assistance Recommended at Discharge Intermittent Supervision/Assistance  Patient can return home with the following  A little help with walking and/or transfers;A little help with bathing/dressing/bathroom;Help with stairs or ramp for entrance    Equipment Recommendations None recommended by PT  Recommendations for Other Services       Functional Status Assessment Patient has had a recent decline in their functional status and demonstrates the ability to make significant improvements in function in a reasonable and predictable amount of time.     Precautions / Restrictions Precautions Precautions: None Restrictions Weight Bearing Restrictions: No      Mobility  Bed Mobility Overal bed mobility: Modified Independent             General bed mobility comments: Slow movement, use of UEs Patient Response: Cooperative  Transfers Overall transfer level: Modified independent Equipment used: Rolling walker (2 wheels)               General transfer comment: slow, slightly labored movement    Ambulation/Gait Ambulation/Gait assistance: Supervision Gait Distance (Feet): 20 Feet Assistive device: Rolling walker (2 wheels) Gait Pattern/deviations: Decreased stride length, Trunk flexed Gait velocity: Decreased        Stairs            Wheelchair Mobility    Modified Rankin (Stroke Patients Only)       Balance Overall balance assessment: Needs assistance  Sitting-balance support: Feet supported Sitting balance-Leahy Scale: Good Sitting balance - Comments: at edge of recliner   Standing balance support: Bilateral upper extremity supported Standing balance-Leahy Scale: Fair Standing balance comment: Can Stand without RW, but requires RW assist for dynamic balance (marching/ gait)                             Pertinent Vitals/Pain Pain  Assessment Pain Assessment: No/denies pain    Home Living Family/patient expects to be discharged to:: Private residence Living Arrangements: Other (Comment) (Lives with special needs son, has another son that lives nearby and will come stay/ available PRN) Available Help at Discharge: Family Type of Home: Mobile home Home Access: Stairs to enter Entrance Stairs-Rails: Can reach both Entrance Stairs-Number of Steps: 4   Home Layout: One level Home Equipment: Conservation officer, nature (2 wheels)      Prior Function Prior Level of Function : Independent/Modified Independent               ADLs Comments: Reports IND in community as PLOF     Hand Dominance        Extremity/Trunk Assessment   Upper Extremity Assessment Upper Extremity Assessment: Overall WFL for tasks assessed    Lower Extremity Assessment Lower Extremity Assessment: Overall WFL for tasks assessed    Cervical / Trunk Assessment Cervical / Trunk Assessment: Normal  Communication   Communication: No difficulties  Cognition Arousal/Alertness: Awake/alert Behavior During Therapy: WFL for tasks assessed/performed Overall Cognitive Status: Within Functional Limits for tasks assessed                                          General Comments      Exercises General Exercises - Lower Extremity Hip Flexion/Marching: Strengthening, Both, 10 reps, Standing (with RW)   Assessment/Plan    PT Assessment Patient needs continued PT services  PT Problem List Decreased mobility;Decreased activity tolerance       PT Treatment Interventions Gait training;Stair training;Therapeutic exercise;Neuromuscular re-education;Functional mobility training;Therapeutic activities;Patient/family education;Balance training;DME instruction    PT Goals (Current goals can be found in the Care Plan section)  Acute Rehab PT Goals Patient Stated Goal: Return home PT Goal Formulation: With patient Time For Goal  Achievement: 03/28/22 Potential to Achieve Goals: Good    Frequency Min 2X/week     Co-evaluation               AM-PAC PT "6 Clicks" Mobility  Outcome Measure Help needed turning from your back to your side while in a flat bed without using bedrails?: A Little Help needed moving from lying on your back to sitting on the side of a flat bed without using bedrails?: A Little Help needed moving to and from a bed to a chair (including a wheelchair)?: A Little Help needed standing up from a chair using your arms (e.g., wheelchair or bedside chair)?: A Little Help needed to walk in hospital room?: A Little Help needed climbing 3-5 steps with a railing? : A Little 6 Click Score: 18    End of Session Equipment Utilized During Treatment: Oxygen Activity Tolerance: Patient tolerated treatment well;Patient limited by fatigue Patient left: in chair;with call bell/phone within reach Nurse Communication: Mobility status PT Visit Diagnosis: Unsteadiness on feet (R26.81);Other abnormalities of gait and mobility (R26.89)    Time: 7628-3151 PT Time Calculation (min) (  ACUTE ONLY): 14 min   Charges:   PT Evaluation $PT Eval Low Complexity: 1 Low         12:18 PM, 03/14/22 Josue Hector PT DPT  Physical Therapist with [facility]  [phone]

## 2022-03-14 NOTE — Progress Notes (Signed)
PROGRESS NOTE  Gregory Alvarado WFU:[id number] DOB: 1950/09/18 DOA: 03/12/2022 PCP: Celene Squibb, MD  Brief History:  71 year old male with a history of COPD, chronic respiratory failure on 4 L, tobacco abuse, systolic and diastolic CHF, GI bleed, hypertension, hyperlipidemia, diabetes mellitus type 2, coronary artery disease with history of DES, hyperlipidemia, and poor compliance presenting after being found unresponsive by his son.  In the emergency department, the patient was awake and lethargic and answering simple questions.  Apparently, the patient had been feeling dizzy prior to his syncopal episode.  He had had 4 days of hematochezia without any abdominal pain, nausea, vomiting, melena.  The patient was also noted to be hypoglycemic by EMS with a CBG of 60. The patient was admitted for further evaluation and treatment of his hyperglycemia, altered mental status, and GI bleed.  Initial hemoglobin was 5.7.  He was transfused 2 units PRBC.  IV Protonix was started.  GI was consulted to assist with management.  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  The patient was noted to have fluid overload with acute on chronic combined CHF.  He was started on IV furosemide.     Assessment and Plan: * Acute metabolic encephalopathy Initially found unresponsive, improved currently, but lethargic.  Reports initial dizziness, has had bloody stools.   Etiology likely acute blood loss 2/2 GI bleed .  Also found to be hypoglycemic - 60 by EMS. 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, duodenum, jejunum tx with APC -03/13/2022 EGD as discussed above in the history  Acute on chronic combined systolic and diastolic CHF (congestive heart failure) ([place]) 08/2020 echo EF 35 to 40%, grade 2 DD 08/2021 echo EF 55 to 60%, grade 1 DD Patient appears clinically fluid overloaded Continue IV furosemide Daily weights Accurate I's and O's Previous dry weight 130.1 kg 03/14/22 weight--132.4 kg Obtain ReDS reading  Chronic respiratory failure with hypoxia and hypercapnia ([place]) Patient is chronically on 4 L nasal cannula Continue DuoNebs Initially given Solu-Medrol 125 mg in the ED>> hold off on further steroids at this time Patient is on nightly BiPAP  Syncope Likely due to acute GI blood loss possible hypoglycemia also contributing.  Coronary artery disease -no Chest pain presently -EKG without concerning ischemic changes 09/12/20 cath: LM LIs, LAD prox 70% and 90% mid, LCX 80% prox with thrombus, OM2 85%, RCA small non dom 60%. LVEDP elevated but number not given,  - received DES to prox/mid LAD, DES to prox LCX, DES to OM2.  08/2020 echo: LVEF 35-40% grade II DDx, mild MR -Holding ASA temporarily  COPD (chronic obstructive pulmonary disease) (HCC) Rhonchi on exam.  Denies dyspnea or cough.  Has chronic respiratory failure on 4 L.  Also on nightly BiPAP.  Chest x-ray suggest atelectasis versus infection.  Afebrile without leukocytosis. -Resume BiPAP and supplemental O2 -Hold off on antibiotics at this time, low threshold to start pending  clinical course -DuoNebs as needed  -Solu-Medrol 125 mg x 1 given in ED, holding off on further steroids for now  Obesity, Class III, BMI 40-49.9 (morbid obesity) (HCC) BMI 40.36 Lifestyle modification  Current  smoker Tobacco cessation discussed  Hyperlipidemia Restart statin  Uncontrolled type 2 diabetes mellitus with hyperglycemia, with long-term current use of insulin ([place]) -Patient remains intermittently hyperglycemic during the hospitalization -Hold metformin, home NPH 7030 -70 units twice daily NovoLog sliding scale  Essential hypertension Stable. -In the setting of GI bleed hold metoprolol and lisinopril 20 mg initially           Family Communication:  no Family at bedside  Consultants:  GI  Code Status:  FULL   DVT Prophylaxis:  SCDs   Procedures: As Listed in Progress Note Above  Antibiotics: None      Subjective: He has dyspnea on exertion.  Patient denies fevers, chills, headache, chest pain, dyspnea, nausea, vomiting, diarrhea, abdominal pain, dysuria, hematuria, hematochezia, and melena.   Objective: Vitals:   03/13/22 1914 03/13/22 2115 03/14/22 0451 03/14/22 0753  BP:  (!) 129/46 (!) 154/56   Pulse: 74 (!) 106 97   Resp: (!) '22 20 17   '$ Temp:  98.5 F (36.9 C) 98.6 F (37 C)   TempSrc:  Oral Oral   SpO2: 98% 97% 100% 92%  Weight:      Height:        Intake/Output Summary (Last 24 hours) at 03/14/2022 0904 Last data filed at 03/14/2022 8341 Gross per 24 hour  Intake 400 ml  Output 2750 ml  Net -2350 ml   Weight change:  Exam:  General:  Pt is alert, follows commands appropriately, not in acute distress HEENT: No icterus, No thrush, No neck mass, [place]/AT Cardiovascular: RRR, S1/S2, no rubs, no gallops Respiratory: bibasilar crackles. No wheeze Abdomen: Soft/+BS, non tender, non distended, no guarding Extremities: 1 + LE edema, No lymphangitis, No petechiae, No rashes, no synovitis   Data Reviewed: I have personally reviewed following labs and imaging studies Basic Metabolic Panel: Recent Labs  Lab 03/12/22 1424 03/13/22 0505 03/14/22 0556  NA 136 136 135  K 4.9 4.4 3.7  CL 98 93* 92*  CO2 32 31 35*  GLUCOSE 91 237* 210*  BUN  15 24* 24*  CREATININE 1.23 1.40* 1.15  CALCIUM 8.7* 9.0 8.8*   Liver Function Tests: Recent Labs  Lab 03/12/22 1424  AST 13*  ALT 8  ALKPHOS 76  BILITOT 0.5  PROT 6.4*  ALBUMIN 3.3*   No results for input(s): "LIPASE", "AMYLASE" in the last 168 hours. No results for input(s): "AMMONIA" in the last 168 hours. Coagulation Profile: No results for input(s): "INR", "PROTIME" in the last 168 hours. CBC: Recent Labs  Lab 03/12/22 1424 03/13/22 0505 03/13/22 1520 03/14/22 0556  WBC 9.6 10.8* 15.5* 12.4*  NEUTROABS 8.2*  --   --  11.0*  HGB 5.7* 8.0* 7.6* 7.3*  HCT 22.8* 29.4* 27.7* 26.3*  MCV 76.5* 76.0* 75.9* 74.9*  PLT 205 222 216 197   Cardiac Enzymes: No results for input(s): "CKTOTAL", "CKMB", "CKMBINDEX", "TROPONINI" in the last 168 hours. BNP: Invalid input(s): "POCBNP" CBG: Recent Labs  Lab 03/13/22 1130 03/13/22 1410 03/13/22 1703 03/14/22 0002 03/14/22 0535  GLUCAP 176* 167* 192* 309* 200*   HbA1C: No results for input(s): "HGBA1C" in the last 72 hours. Urine analysis:    Component Value Date/Time   COLORURINE YELLOW 08/28/2021 [place] 08/28/2021 1025   LABSPEC  1.010 08/28/2021 1025   PHURINE 5.5 08/28/2021 1025   GLUCOSEU NEGATIVE 08/28/2021 1025   HGBUR LARGE (A) 08/28/2021 1025   BILIRUBINUR NEGATIVE 08/28/2021 1025   [place] 08/28/2021 1025   PROTEINUR NEGATIVE 08/28/2021 1025   NITRITE NEGATIVE 08/28/2021 1025   LEUKOCYTESUR NEGATIVE 08/28/2021 1025   Sepsis Labs: '@LABRCNTIP'$ (procalcitonin:4,lacticidven:4) ) Recent Results (from the past 240 hour(s))  MRSA Next Gen by PCR, Nasal     Status: None   Collection Time: 03/13/22 10:15 AM   Specimen: Nasal Mucosa; Nasal Swab  Result Value Ref Range Status   MRSA by PCR Next Gen NOT DETECTED NOT DETECTED Final    Comment: (NOTE) The GeneXpert MRSA Assay (FDA approved for NASAL specimens only), is one component of a comprehensive MRSA colonization  surveillance program. It is not intended to diagnose MRSA infection nor to guide or monitor treatment for MRSA infections. Test performance is not FDA approved in patients less than 63 years old. Performed at [facility], [address] [place], [place] [address]      Scheduled Meds:  atorvastatin  40 mg Oral Daily   folic acid  1 mg Oral Daily   furosemide  40 mg Intravenous Daily   insulin aspart  0-15 Units Subcutaneous TID WC   insulin aspart  0-5 Units Subcutaneous QHS   insulin glargine-yfgn  5 Units Subcutaneous Daily   ipratropium-albuterol  3 mL Nebulization TID   metoprolol succinate  50 mg Oral Daily   pantoprazole (PROTONIX) IV  40 mg Intravenous Q12H   Continuous Infusions:  ferric gluconate (FERRLECIT) IVPB      Procedures/Studies: CT HEAD WO CONTRAST (5MM)  Result Date: 03/12/2022 CLINICAL DATA:  Initial evaluation for altered mental status. EXAM: CT HEAD WITHOUT CONTRAST TECHNIQUE: Contiguous axial images were obtained from the base of the skull through the vertex without intravenous contrast. RADIATION DOSE REDUCTION: This exam was performed according to the departmental dose-optimization program which includes automated exposure control, adjustment of the mA and/or kV according to patient size and/or use of iterative reconstruction technique. COMPARISON:  Prior CT from 11/21/2012. FINDINGS: Brain: Cerebral volume within normal limits for age. Mild chronic microvascular ischemic disease noted involving the supratentorial cerebral white matter. No acute intracranial hemorrhage. No acute large vessel territory infarct. No mass lesion, mass effect or midline shift. No hydrocephalus or extra-axial fluid collection. Vascular: No abnormal hyperdense vessel. Skull: Scalp soft tissues and calvarium within normal limits. Sinuses/Orbits: Globes orbital soft tissues within normal limits. Paranasal sinuses are largely clear. Sequelae of prior mastoidectomy on the left. Small volume  mucosal thickening about the mastoid bowl. Trace right mastoid effusion. Other: None. IMPRESSION: 1. No acute intracranial abnormality. 2. Mild chronic microvascular ischemic disease. Electronically Signed   By: Jeannine Boga M.D.   On: 03/12/2022 22:05   DG Chest Port 1 View  Result Date: 03/12/2022 CLINICAL DATA:  Shortness of breath. EXAM: PORTABLE CHEST 1 VIEW COMPARISON:  March 13, 22 FINDINGS: Mild streaky bibasilar opacities. No focal confluent consolidation. Similar enlarged cardiac silhouette. No acute osseous abnormality. IMPRESSION: 1. Mild streaky bibasilar opacities, favor atelectasis. Infection is not excluded. 2. Similar cardiomegaly. Electronically Signed   By: Margaretha Sheffield M.D.   On: 03/12/2022 14:04    Orson Eva, DO  [facility]  If 7PM-7AM, please contact night-coverage [email or web address] Password [email or web address] 03/14/2022, 9:04 AM   LOS: 2 days

## 2022-03-14 NOTE — Anesthesia Postprocedure Evaluation (Signed)
Anesthesia Post Note  Patient: Gregory Alvarado  Procedure(s) Performed: ESOPHAGOGASTRODUODENOSCOPY (EGD) WITH PROPOFOL ENTEROSCOPY  Patient location during evaluation: Phase II Anesthesia Type: General Level of consciousness: awake Pain management: pain level controlled Vital Signs Assessment: post-procedure vital signs reviewed and stable Respiratory status: spontaneous breathing and respiratory function stable Cardiovascular status: blood pressure returned to baseline and stable Postop Assessment: no headache and no apparent nausea or vomiting Anesthetic complications: no Comments: Late entry   No notable events documented.   Last Vitals:  Vitals:   03/14/22 0451 03/14/22 0753  BP: (!) 154/56   Pulse: 97   Resp: 17   Temp: 37 C   SpO2: 100% 92%    Last Pain:  Vitals:   03/14/22 0451  TempSrc: Oral  PainSc:                  Louann Sjogren

## 2022-03-14 NOTE — Assessment & Plan Note (Addendum)
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

## 2022-03-14 NOTE — Progress Notes (Signed)
Attempted to place bed alarm on patient for patient safety because he is a high fall risk.  Patient is alert and oriented x 4.  Patient refused be alarm at this time.  Educated patient to call for help if he needed assistance. Patient verbalized understanding.

## 2022-03-14 NOTE — Assessment & Plan Note (Signed)
Tobacco cessation discussed 

## 2022-03-14 NOTE — Hospital Course (Signed)
71 year old male with a history of COPD, chronic respiratory failure on 4 L, tobacco abuse, systolic and diastolic CHF, GI bleed, hypertension, hyperlipidemia, diabetes mellitus type 2, coronary artery disease with history of DES, hyperlipidemia, and poor compliance presenting after being found unresponsive by his son.  In the emergency department, the patient was awake and lethargic and answering simple questions.  Apparently, the patient had been feeling dizzy prior to his syncopal episode.  He had had 4 days of hematochezia without any abdominal pain, nausea, vomiting, melena.  The patient was also noted to be hypoglycemic by EMS with a CBG of 60. The patient was admitted for further evaluation and treatment of his hyperglycemia, altered mental status, and GI bleed.  Initial hemoglobin was 5.7.  He was transfused 2 units PRBC.  IV Protonix was started.  GI was consulted to assist with management.  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  The patient was noted to have fluid overload with acute on chronic combined CHF.  He was started on IV furosemide.

## 2022-03-14 NOTE — Assessment & Plan Note (Signed)
Patient is chronically on 4 L nasal cannula Continue DuoNebs Initially given Solu-Medrol 125 mg in the ED>> hold off on further steroids at this time Patient is on nightly BiPAP

## 2022-03-15 LAB — BASIC METABOLIC PANEL
Anion gap: 8 (ref 5–15)
BUN: 18 mg/dL (ref 8–23)
CO2: 35 mmol/L — ABNORMAL HIGH (ref 22–32)
Calcium: 8.9 mg/dL (ref 8.9–10.3)
Chloride: 94 mmol/L — ABNORMAL LOW (ref 98–111)
Creatinine, Ser: 1.08 mg/dL (ref 0.61–1.24)
GFR, Estimated: >60 mL/min (ref >60)
Glucose, Bld: 189 mg/dL — ABNORMAL HIGH (ref 70–99)
Potassium: 3.5 mmol/L (ref 3.5–5.1)
Sodium: 137 mmol/L (ref 135–145)

## 2022-03-15 LAB — CBC
HCT: 29.4 % — ABNORMAL LOW (ref 39.0–52.0)
Hemoglobin: 7.7 g/dL — ABNORMAL LOW (ref 13.0–17.0)
MCH: 20.1 pg — ABNORMAL LOW (ref 26.0–34.0)
MCHC: 26.2 g/dL — ABNORMAL LOW (ref 30.0–36.0)
MCV: 76.6 fL — ABNORMAL LOW (ref 80.0–100.0)
Platelets: 194 10*3/uL (ref 150–400)
RBC: 3.84 MIL/uL — ABNORMAL LOW (ref 4.22–5.81)
RDW: 20.5 % — ABNORMAL HIGH (ref 11.5–15.5)
WBC: 10 10*3/uL (ref 4.0–10.5)
nRBC: 0.2 % (ref 0.0–0.2)

## 2022-03-15 LAB — GLUCOSE, CAPILLARY
Glucose-Capillary: 195 mg/dL — ABNORMAL HIGH (ref 70–99)
Glucose-Capillary: 228 mg/dL — ABNORMAL HIGH (ref 70–99)

## 2022-03-15 MED ORDER — FERROUS SULFATE 325 (65 FE) MG PO TABS: 325.0000 mg | ORAL_TABLET | Freq: Two times a day (BID) | ORAL | Status: DC

## 2022-03-15 MED ORDER — SODIUM CHLORIDE 0.9 % IV SOLN
250.0000 mg | Freq: Once | INTRAVENOUS | Status: AC
  Administered 2022-03-15: 250 mg via INTRAVENOUS
  Filled 2022-03-15: qty 20

## 2022-03-15 MED ORDER — LISINOPRIL 10 MG PO TABS: 10.0000 mg | ORAL_TABLET | Freq: Every day | ORAL | 1 refills | Status: DC

## 2022-03-15 MED ORDER — FUROSEMIDE 40 MG PO TABS: 40.0000 mg | ORAL_TABLET | Freq: Every day | ORAL | 1 refills | Status: DC

## 2022-03-15 MED ORDER — NYSTATIN 100000 UNIT/GM EX POWD
Freq: Two times a day (BID) | CUTANEOUS | Status: DC
  Filled 2022-03-15: qty 15

## 2022-03-15 MED ORDER — FERROUS SULFATE 325 (65 FE) MG PO TABS: 325.0000 mg | ORAL_TABLET | Freq: Two times a day (BID) | ORAL | 3 refills | Status: DC

## 2022-03-15 NOTE — Progress Notes (Signed)
Nsg Discharge Note  Admit Date:  03/12/2022 Discharge date: 03/15/2022   Gregory Alvarado to be D/C'd Home per MD order.  AVS completed.  Copy for chart, and copy for patient signed, and dated. Patient/caregiver able to verbalize understanding. IV removed and discharge paper work given and reviewed with patient,   Discharge Medication: Allergies as of 03/15/2022       Reactions   Caduet [amlodipine-atorvastatin] Swelling        Medication List     STOP taking these medications    folic acid 1 MG tablet Commonly known as: FOLVITE   nitroGLYCERIN 0.4 MG SL tablet Commonly known as: NITROSTAT   pantoprazole 40 MG tablet Commonly known as: PROTONIX   spironolactone 25 MG tablet Commonly known as: ALDACTONE       TAKE these medications    acetaminophen 325 MG tablet Commonly known as: TYLENOL Take 2 tablets (650 mg total) by mouth every 6 (six) hours as needed for mild pain (or Fever >/= 101).   atorvastatin 80 MG tablet Commonly known as: LIPITOR Take 80 mg by mouth daily.   Combivent Respimat 20-100 MCG/ACT Aers respimat Generic drug: Ipratropium-Albuterol Inhale 1 puff into the lungs every 6 (six) hours as needed for wheezing or shortness of breath.   cyanocobalamin 1000 MCG/ML injection Commonly known as: (VITAMIN B-12) Inject 1,000 mcg into the muscle every 30 (thirty) days.   ferrous sulfate 325 (65 FE) MG tablet Take 1 tablet (325 mg total) by mouth 2 (two) times daily with a meal. Start taking on: March 16, 2022   furosemide 40 MG tablet Commonly known as: LASIX Take 1 tablet (40 mg total) by mouth daily. What changed:  when to take this reasons to take this   gabapentin 300 MG capsule Commonly known as: NEURONTIN Take 1 capsule by mouth 4 (four) times daily.   insulin NPH-regular Human (70-30) 100 UNIT/ML injection Inject 70 Units into the skin in the morning and at bedtime.   levocetirizine 5 MG tablet Commonly known as: XYZAL Take 5 mg by mouth  at bedtime.   lisinopril 10 MG tablet Commonly known as: ZESTRIL Take 1 tablet (10 mg total) by mouth daily. What changed:  medication strength how much to take   metoprolol succinate 100 MG 24 hr tablet Commonly known as: TOPROL-XL TAKE 1 AND 1/2 TABLETS BY MOUTH ONCE A DAY.   traMADol 50 MG tablet Commonly known as: ULTRAM Take 50-100 mg by mouth in the morning and at bedtime.   zolpidem 5 MG tablet Commonly known as: AMBIEN Take 5 mg by mouth at bedtime as needed for sleep.        Discharge Assessment: Vitals:   03/15/22 1209 03/15/22 1416  BP: (!) 110/52   Pulse: 97   Resp: 18   Temp: 98.3 F (36.8 C)   SpO2: 98% 95%   Skin clean, dry and intact without evidence of skin break down, no evidence of skin tears noted. IV catheter discontinued intact. Site without signs and symptoms of complications - no redness or edema noted at insertion site, patient denies c/o pain - only slight tenderness at site.  Dressing with slight pressure applied.  D/c Instructions-Education: Discharge instructions given to patient/family with verbalized understanding. D/c education completed with patient/family including follow up instructions, medication list, d/c activities limitations if indicated, with other d/c instructions as indicated by MD - patient able to verbalize understanding, all questions fully answered. Patient instructed to return to ED, call 911, or  call MD for any changes in condition.  Patient escorted via [place], and D/C home via private auto.  Zenaida Deed, RN 03/15/2022 3:55 PM

## 2022-03-15 NOTE — Discharge Summary (Signed)
Physician Discharge Summary   Patient: Gregory Alvarado MRN: [id number] DOB: 03/08/1951  Admit date:     03/12/2022  Discharge date: 03/15/22  Discharge Physician: Shanon Brow Kaseem Vastine   PCP: Celene Squibb, MD   Recommendations at discharge:   Please follow up with primary care provider within 1-2 weeks  Please repeat BMP and CBC in one week     Hospital Course: 71 year old male with a history of COPD, chronic respiratory failure on 3 L, tobacco abuse, systolic and diastolic CHF, GI bleed, hypertension, hyperlipidemia, diabetes mellitus type 2, coronary artery disease with history of DES, hyperlipidemia, and poor compliance presenting after being found unresponsive by his son.  In the emergency department, the patient was awake and lethargic and answering simple questions.  Apparently, the patient had been feeling dizzy prior to his syncopal episode.  He had had 4 days of hematochezia without any abdominal pain, nausea, vomiting, melena.  The patient was also noted to be hypoglycemic by EMS with a CBG of 60. The patient was admitted for further evaluation and treatment of his hyperglycemia, altered mental status, and GI bleed.  Initial hemoglobin was 5.7.  He was transfused 2 units PRBC.  IV Protonix was started.  GI was consulted to assist with management.  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  The patient was noted to have fluid overload with acute on chronic combined CHF.  He was started on IV furosemide with clinical improvement.  He was down to his "dry" weight on day of d/c--287 lbs.  Pt instructed to take lasix daily (he was only taking prn)  Hgb remained stable--7.7 on day of d/c without any further hematochezia.  Nulecit IV x 2 given.  Dc home with ferrous sulfate bid.  Assessment and Plan: * Acute metabolic  encephalopathy Initially found unresponsive, improved currently, but lethargic.  Reports initial dizziness, has had bloody stools.   Etiology likely acute blood loss 2/2 GI bleed .  Also found to be hypoglycemic - 60 by EMS. 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, duodenum, jejunum tx with APC -03/13/2022 EGD as discussed above in the history -7/1 and 7/2 given nulecit IV -d/c home with ferrous sulfate bid -Hgb remained stable at time of d/c x 48 hours--7.7 on day of d/c   Acute blood loss anemia Work-up as discussed above Baseline hemoglobin 9-10 -11/26/21 enteroscopy--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, duodenum, jejunum tx with APC -03/13/2022 EGD as discussed above in the history -Hgb remained stable at time of d/c x 48 hours--7.7 on day of d/c  Acute on chronic combined systolic and diastolic CHF (congestive heart failure) ([place]) 08/2020 echo EF 35 to 40%, grade 2 DD 08/2021 echo EF 55 to 60%, grade 1 DD Patient appears clinically fluid overloaded Continue IV furosemide>>d/c home with furosemide 40 mg po daily (pt was only taking it prn) Daily weights Accurate I's and O's Previous dry weight 130.1 kg 03/14/22 weight--132.4 kg 03/15/22 weight--103.2 kg (287 lbs) Obtain ReDS reading--33  Chronic respiratory failure with hypoxia and hypercapnia ([place]) Patient is chronically on 3 L nasal cannula Continue DuoNebs Initially given Solu-Medrol 125 mg in the ED>> hold off on  further steroids at this time Patient is on nightly BiPAP  Syncope Likely due to acute GI blood loss possible hypoglycemia also contributing.  Coronary artery disease -no Chest pain presently -EKG without concerning ischemic changes 09/12/20 cath: LM LIs, LAD  prox 70% and 90% mid, LCX 80% prox with thrombus, OM2 85%, RCA small non dom 60%. LVEDP elevated but number not given,  - received DES to prox/mid LAD, DES to prox LCX, DES to OM2.  08/2020 echo: LVEF 35-40% grade II DDx, mild MR -Holding ASA temporarily  COPD (chronic obstructive pulmonary disease) (HCC) Rhonchi on exam.  Denies dyspnea or cough.  Has chronic respiratory failure on 3 L.  Also on nightly BiPAP.  Chest x-ray suggest atelectasis versus infection.  Afebrile without leukocytosis. -Resume BiPAP and supplemental O2 -Hold off on antibiotics at this time, low threshold to start pending clinical course -DuoNebs as needed  -Solu-Medrol 125 mg x 1 given in ED, holding off on further steroids for now  Obesity, Class III, BMI 40-49.9 (morbid obesity) (HCC) BMI 40.36 Lifestyle modification  Current smoker Tobacco cessation discussed  Hyperlipidemia Restart statin  Uncontrolled type 2 diabetes mellitus with hyperglycemia, with long-term current use of insulin ([place]) -Patient remains intermittently hyperglycemic during the hospitalization -Hold metformin, home NPH 7030 -70 units twice daily NovoLog sliding scale  Essential hypertension Stable. -In the setting of GI bleed hold metoprolol and lisinopril 20 mg initially -d/c home with lower dose of lisinopril 10 mg         Consultants: GI Procedures performed: EGD  Disposition: Home Diet recommendation:  Carb modified diet DISCHARGE MEDICATION: Allergies as of 03/15/2022       Reactions   Caduet [amlodipine-atorvastatin] Swelling        Medication List     STOP taking these medications    folic acid 1 MG tablet Commonly known as: FOLVITE   nitroGLYCERIN 0.4 MG SL tablet Commonly known as: NITROSTAT   pantoprazole 40 MG tablet Commonly known as: PROTONIX   spironolactone 25 MG tablet Commonly known as: ALDACTONE       TAKE these medications    acetaminophen 325 MG tablet Commonly known as:  TYLENOL Take 2 tablets (650 mg total) by mouth every 6 (six) hours as needed for mild pain (or Fever >/= 101).   atorvastatin 80 MG tablet Commonly known as: LIPITOR Take 80 mg by mouth daily.   Combivent Respimat 20-100 MCG/ACT Aers respimat Generic drug: Ipratropium-Albuterol Inhale 1 puff into the lungs every 6 (six) hours as needed for wheezing or shortness of breath.   cyanocobalamin 1000 MCG/ML injection Commonly known as: (VITAMIN B-12) Inject 1,000 mcg into the muscle every 30 (thirty) days.   ferrous sulfate 325 (65 FE) MG tablet Take 1 tablet (325 mg total) by mouth 2 (two) times daily with a meal. Start taking on: March 16, 2022   furosemide 40 MG tablet Commonly known as: LASIX Take 1 tablet (40 mg total) by mouth daily. What changed:  when to take this reasons to take this   gabapentin 300 MG capsule Commonly known as: NEURONTIN Take 1 capsule by mouth 4 (four) times daily.   insulin NPH-regular Human (70-30) 100 UNIT/ML injection Inject 70 Units into the skin in the morning and at bedtime.   levocetirizine 5 MG tablet Commonly known as: XYZAL Take 5 mg by mouth at bedtime.   lisinopril 10 MG tablet Commonly known as: ZESTRIL Take 1 tablet (10 mg total) by mouth daily. What changed:  medication  strength how much to take   metoprolol succinate 100 MG 24 hr tablet Commonly known as: TOPROL-XL TAKE 1 AND 1/2 TABLETS BY MOUTH ONCE A DAY.   traMADol 50 MG tablet Commonly known as: ULTRAM Take 50-100 mg by mouth in the morning and at bedtime.   zolpidem 5 MG tablet Commonly known as: AMBIEN Take 5 mg by mouth at bedtime as needed for sleep.        Discharge Exam: Filed Weights   03/12/22 1329  Weight: 135 kg   HEENT:  [place]/AT, No thrush, no icterus CV:  RRR, no rub, no S3, no S4 Lung:  CTA, no wheeze, no rhonchi Abd:  soft/+BS, NT Ext:  No edema, no lymphangitis, no synovitis, no rash   Condition at discharge: stable  The results of  significant diagnostics from this hospitalization (including imaging, microbiology, ancillary and laboratory) are listed below for reference.   Imaging Studies: CT HEAD WO CONTRAST (5MM)  Result Date: 03/12/2022 CLINICAL DATA:  Initial evaluation for altered mental status. EXAM: CT HEAD WITHOUT CONTRAST TECHNIQUE: Contiguous axial images were obtained from the base of the skull through the vertex without intravenous contrast. RADIATION DOSE REDUCTION: This exam was performed according to the departmental dose-optimization program which includes automated exposure control, adjustment of the mA and/or kV according to patient size and/or use of iterative reconstruction technique. COMPARISON:  Prior CT from 11/21/2012. FINDINGS: Brain: Cerebral volume within normal limits for age. Mild chronic microvascular ischemic disease noted involving the supratentorial cerebral white matter. No acute intracranial hemorrhage. No acute large vessel territory infarct. No mass lesion, mass effect or midline shift. No hydrocephalus or extra-axial fluid collection. Vascular: No abnormal hyperdense vessel. Skull: Scalp soft tissues and calvarium within normal limits. Sinuses/Orbits: Globes orbital soft tissues within normal limits. Paranasal sinuses are largely clear. Sequelae of prior mastoidectomy on the left. Small volume mucosal thickening about the mastoid bowl. Trace right mastoid effusion. Other: None. IMPRESSION: 1. No acute intracranial abnormality. 2. Mild chronic microvascular ischemic disease. Electronically Signed   By: Jeannine Boga M.D.   On: 03/12/2022 22:05   DG Chest Port 1 View  Result Date: 03/12/2022 CLINICAL DATA:  Shortness of breath. EXAM: PORTABLE CHEST 1 VIEW COMPARISON:  March 13, 22 FINDINGS: Mild streaky bibasilar opacities. No focal confluent consolidation. Similar enlarged cardiac silhouette. No acute osseous abnormality. IMPRESSION: 1. Mild streaky bibasilar opacities, favor atelectasis.  Infection is not excluded. 2. Similar cardiomegaly. Electronically Signed   By: Margaretha Sheffield M.D.   On: 03/12/2022 14:04    Microbiology: Results for orders placed or performed during the hospital encounter of 03/12/22  MRSA Next Gen by PCR, Nasal     Status: None   Collection Time: 03/13/22 10:15 AM   Specimen: Nasal Mucosa; Nasal Swab  Result Value Ref Range Status   MRSA by PCR Next Gen NOT DETECTED NOT DETECTED Final    Comment: (NOTE) The GeneXpert MRSA Assay (FDA approved for NASAL specimens only), is one component of a comprehensive MRSA colonization surveillance program. It is not intended to diagnose MRSA infection nor to guide or monitor treatment for MRSA infections. Test performance is not FDA approved in patients less than 49 years old. Performed at [facility], [address] [place], [place] [address]     Labs: CBC: Recent Labs  Lab 03/12/22 1424 03/13/22 0505 03/13/22 1520 03/14/22 0556 03/15/22 0611  WBC 9.6 10.8* 15.5* 12.4* 10.0  NEUTROABS 8.2*  --   --  11.0*  --  HGB 5.7* 8.0* 7.6* 7.3* 7.7*  HCT 22.8* 29.4* 27.7* 26.3* 29.4*  MCV 76.5* 76.0* 75.9* 74.9* 76.6*  PLT 205 222 216 197 993   Basic Metabolic Panel: Recent Labs  Lab 03/12/22 1424 03/13/22 0505 03/14/22 0556 03/15/22 0611  NA 136 136 135 137  K 4.9 4.4 3.7 3.5  CL 98 93* 92* 94*  CO2 32 31 35* 35*  GLUCOSE 91 237* 210* 189*  BUN 15 24* 24* 18  CREATININE 1.23 1.40* 1.15 1.08  CALCIUM 8.7* 9.0 8.8* 8.9   Liver Function Tests: Recent Labs  Lab 03/12/22 1424  AST 13*  ALT 8  ALKPHOS 76  BILITOT 0.5  PROT 6.4*  ALBUMIN 3.3*   CBG: Recent Labs  Lab 03/14/22 0909 03/14/22 1116 03/14/22 1611 03/14/22 2157 03/15/22 0730  GLUCAP 218* 165* 246* 274* 195*    Discharge time spent: greater than 30 minutes.  Signed: Orson Eva, MD [facility] 03/15/2022

## 2022-03-16 ENCOUNTER — Other Ambulatory Visit: Payer: Self-pay

## 2022-03-16 ENCOUNTER — Encounter (HOSPITAL_COMMUNITY): Payer: Self-pay | Admitting: Internal Medicine

## 2022-03-16 DIAGNOSIS — D638 Anemia in other chronic diseases classified elsewhere: Secondary | ICD-10-CM

## 2022-03-16 DIAGNOSIS — K922 Gastrointestinal hemorrhage, unspecified: Secondary | ICD-10-CM

## 2022-03-16 NOTE — Telephone Encounter (Signed)
Pt advised of Labs needed in one week. Pt states he will have done with Dr. Nevada Crane (labs in mail and in the system).

## 2022-03-20 DIAGNOSIS — I5022 Chronic systolic (congestive) heart failure: Secondary | ICD-10-CM | POA: Diagnosis not present

## 2022-03-20 DIAGNOSIS — I10 Essential (primary) hypertension: Secondary | ICD-10-CM | POA: Diagnosis not present

## 2022-03-20 DIAGNOSIS — E1169 Type 2 diabetes mellitus with other specified complication: Secondary | ICD-10-CM | POA: Diagnosis not present

## 2022-03-20 DIAGNOSIS — Z794 Long term (current) use of insulin: Secondary | ICD-10-CM | POA: Diagnosis not present

## 2022-03-20 DIAGNOSIS — I2510 Atherosclerotic heart disease of native coronary artery without angina pectoris: Secondary | ICD-10-CM | POA: Diagnosis not present

## 2022-03-20 DIAGNOSIS — Z79891 Long term (current) use of opiate analgesic: Secondary | ICD-10-CM | POA: Diagnosis not present

## 2022-03-20 DIAGNOSIS — I5043 Acute on chronic combined systolic (congestive) and diastolic (congestive) heart failure: Secondary | ICD-10-CM | POA: Diagnosis not present

## 2022-03-20 DIAGNOSIS — J449 Chronic obstructive pulmonary disease, unspecified: Secondary | ICD-10-CM | POA: Diagnosis not present

## 2022-03-20 DIAGNOSIS — F17200 Nicotine dependence, unspecified, uncomplicated: Secondary | ICD-10-CM | POA: Diagnosis not present

## 2022-03-20 DIAGNOSIS — D72829 Elevated white blood cell count, unspecified: Secondary | ICD-10-CM | POA: Diagnosis not present

## 2022-03-20 DIAGNOSIS — D509 Iron deficiency anemia, unspecified: Secondary | ICD-10-CM | POA: Diagnosis not present

## 2022-03-20 DIAGNOSIS — M79604 Pain in right leg: Secondary | ICD-10-CM | POA: Diagnosis not present

## 2022-04-05 DIAGNOSIS — J449 Chronic obstructive pulmonary disease, unspecified: Secondary | ICD-10-CM | POA: Diagnosis not present

## 2022-04-06 ENCOUNTER — Other Ambulatory Visit: Payer: Self-pay | Admitting: Cardiology

## 2022-04-09 DIAGNOSIS — I5043 Acute on chronic combined systolic (congestive) and diastolic (congestive) heart failure: Secondary | ICD-10-CM | POA: Diagnosis not present

## 2022-04-09 DIAGNOSIS — E782 Mixed hyperlipidemia: Secondary | ICD-10-CM | POA: Diagnosis not present

## 2022-04-09 DIAGNOSIS — J9621 Acute and chronic respiratory failure with hypoxia: Secondary | ICD-10-CM | POA: Diagnosis not present

## 2022-04-09 DIAGNOSIS — K922 Gastrointestinal hemorrhage, unspecified: Secondary | ICD-10-CM | POA: Diagnosis not present

## 2022-04-09 DIAGNOSIS — J449 Chronic obstructive pulmonary disease, unspecified: Secondary | ICD-10-CM | POA: Diagnosis not present

## 2022-04-09 DIAGNOSIS — E1169 Type 2 diabetes mellitus with other specified complication: Secondary | ICD-10-CM | POA: Diagnosis not present

## 2022-04-09 DIAGNOSIS — I2510 Atherosclerotic heart disease of native coronary artery without angina pectoris: Secondary | ICD-10-CM | POA: Diagnosis not present

## 2022-04-09 DIAGNOSIS — D509 Iron deficiency anemia, unspecified: Secondary | ICD-10-CM | POA: Diagnosis not present

## 2022-04-09 DIAGNOSIS — J9611 Chronic respiratory failure with hypoxia: Secondary | ICD-10-CM | POA: Diagnosis not present

## 2022-04-09 DIAGNOSIS — I10 Essential (primary) hypertension: Secondary | ICD-10-CM | POA: Diagnosis not present

## 2022-04-09 DIAGNOSIS — D72829 Elevated white blood cell count, unspecified: Secondary | ICD-10-CM | POA: Diagnosis not present

## 2022-04-20 NOTE — Progress Notes (Deleted)
GI Office Note    Referring Provider: Celene Squibb, MD Primary Care Physician:  Celene Squibb, MD Primary Gastroenterologist: Dr. Abbey Chatters  Date:  04/20/2022  ID:  Gregory Alvarado, DOB [birth year], MRN [id number]   Chief Complaint   No chief complaint on file.    History of Present Illness  Gregory Alvarado is a 71 y.o. male with a history of COPD, HTN, diabetes, chronic respiratory failure on 4 L nasal cannula BiPAP at home, systolic and diastolic CHF, known history of small bowel AVMs*** presenting today for hospital follow up.   Patient last seen in the office 01/22/2021: Patient presented in follow-up of recent hospitalization.  He reports stools always being black, denies any BRBPR.  Reported he is having several bowel movements per day, sometimes solid and sometimes loose.  Reportedly was on oral iron.  He reported occasional heartburn, denied any abdominal pain, nausea/vomiting, daily NSAID use.  He did report baby aspirin and Effient daily.  He reported he is not interested in pursuing IV iron due to the expense.  It was also recommended for him to have updated colonoscopy in the near future, was not scheduled at the time of office visit given recent cardiac stent placement in December 2021.  He was advised to consider ultrasound Dopplers of his left lower extremity to rule out DVT which he declined.  Patient was recently hospitalized 03/12/2022 to 03/15/2022.  He was brought to the ED for unresponsiveness, found by his son.  He reported that he was dizzy before he passed out but did not know how long he was unconscious for.  He was found to have a hemoglobin of 5.7 on admission.  He was given 2 units PRBCs.  He reported ongoing dark stools and stated he had been feeling good for 6-8 weeks after prior hospitalization and then started feeling sluggish again.  He reported very little alcohol use and denies any NSAID use.  He reportedly stopped taking his oral iron at home reporting constipation.   Denies any pica or worsening shortness of breath.  He reports he continues to smoke.  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  Recommendation to continue oral iron, monitor hemoglobin and transfuse for less than 7, and to update colonoscopy on outpatient basis.  Hemoglobin 7.7 on discharge.  Patient was advised to repeat CBC in 1 week.  Patient also is being evaluated for acute on chronic 29 congestive heart failure, he received IV furosemide while inpatient and was to DC home with Lasix 40 mg daily.  Last echo in December 2021 with EF 35 to 40%.   Prior GI work-up: Colonoscopy August 2021 -inadequate preparation of the colon, two 6/8 mm polyps at the hepatic flexure  EGD August 2021 -normal esophagus s/p dilation, patchy gastric erythema, previously noted gastric ulcer completely healed, normal duodenum  EGD/enteroscopy November 2021-multiple angioectasias in the duodenum and jejunum s/p APC therapy  EGD March 2023 -single recent bleeding angiodysplastic lesion in the stomach, 6 nonbleeding lesions in the duodenum, 6 nonbleeding lesions in the jejunum all treated with APC therapy.   Today:    Current Outpatient Medications  Medication Sig Dispense Refill   acetaminophen (TYLENOL) 325 MG tablet Take 2 tablets (650 mg total) by mouth every 6 (six) hours as needed for mild pain (or Fever >/= 101). 12  tablet 0   atorvastatin (LIPITOR) 80 MG tablet Take 80 mg by mouth daily.     cyanocobalamin (,VITAMIN B-12,) 1000 MCG/ML injection Inject 1,000 mcg into the muscle every 30 (thirty) days.     ferrous sulfate 325 (65 FE) MG tablet Take 1 tablet (325 mg total) by mouth 2 (two) times daily with a meal. 60 tablet 3   furosemide (LASIX) 40 MG tablet Take 1 tablet (40 mg total) by mouth daily. 30  tablet 1   gabapentin (NEURONTIN) 300 MG capsule Take 1 capsule by mouth 4 (four) times daily.     insulin NPH-regular Human (NOVOLIN 70/30) (70-30) 100 UNIT/ML injection Inject 70 Units into the skin in the morning and at bedtime.     Ipratropium-Albuterol (COMBIVENT RESPIMAT) 20-100 MCG/ACT AERS respimat Inhale 1 puff into the lungs every 6 (six) hours as needed for wheezing or shortness of breath. 4 g 1   levocetirizine (XYZAL) 5 MG tablet Take 5 mg by mouth at bedtime.     lisinopril (ZESTRIL) 10 MG tablet Take 1 tablet (10 mg total) by mouth daily. 30 tablet 1   metoprolol succinate (TOPROL-XL) 100 MG 24 hr tablet TAKE 1 AND 1/2 TABLETS BY MOUTH ONCE A DAY. 30 tablet 0   nitroGLYCERIN (NITROSTAT) 0.4 MG SL tablet DISSOLVE 1 TABLET SUBLINGUALLY AS NEEDED FOR CHEST PAIN, MAY REPEAT EVERY 5 MINUTES. AFTER 3 CALL 911. 25 tablet 0   traMADol (ULTRAM) 50 MG tablet Take 50-100 mg by mouth in the morning and at bedtime.     zolpidem (AMBIEN) 5 MG tablet Take 5 mg by mouth at bedtime as needed for sleep.     No current facility-administered medications for this visit.    Past Medical History:  Diagnosis Date   Anemia    Arthritis    left shoulder   COPD (chronic obstructive pulmonary disease) ([place])    Coronary artery disease 09/11/2020   NSTEMI and 3 stents in [facility]   Diabetes mellitus without complication ([place])    Dyslipidemia    Hypertension    Insomnia    Insomnia    Morbid obesity ([place])    Tobacco use     Past Surgical History:  Procedure Laterality Date   BIOPSY  10/28/2019   Procedure: BIOPSY;  Surgeon: Danie Binder, MD;  [facility];  Service: Endoscopy;;  gastric   BIOPSY  04/15/2020   Procedure: BIOPSY;  Surgeon: Daneil Dolin, MD;  [facility];  Service: Endoscopy;;  gastric   CARDIAC CATHETERIZATION  08/2020   3 stents placed   COLON RESECTION  1990s?   DIVERTICULITIS   COLONOSCOPY WITH PROPOFOL N/A 04/15/2020   Rourk: Poor colon prep.  two 6 to 8 mm polyps removed from the hepatic flexure, tubular adenomas.  Repeat colonoscopy later this year.   ENTEROSCOPY N/A 11/26/2021   Procedure: ENTEROSCOPY;  Surgeon: Harvel Quale, MD;  [facility];  Service: Gastroenterology;  Laterality: N/A;   ENTEROSCOPY N/A 03/13/2022   Procedure: ENTEROSCOPY;  Surgeon: [name], DO;  [facility];  Service: Endoscopy;  Laterality: N/A;   ESOPHAGOGASTRODUODENOSCOPY (EGD) WITH PROPOFOL N/A 10/28/2019   Dr. Oneida Alar: Multiple cratered gastric ulcers, gastritis/duodenitis.  Path showed H. pylori.  Patient treated with amoxicillin/Biaxin/PPI twice daily.   ESOPHAGOGASTRODUODENOSCOPY (EGD) WITH PROPOFOL N/A 04/15/2020   Rourk: Patchy gastric erythema status post biopsy to document eradication of H. pylori.  Biopsy showed persistent H. pylori.  Previously noted gastric  ulcer is completely healed.   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   ESOPHAGOGASTRODUODENOSCOPY (EGD) WITH PROPOFOL N/A 03/13/2022   Procedure: ESOPHAGOGASTRODUODENOSCOPY (EGD) WITH PROPOFOL;  Surgeon: [name], DO;  [facility];  Service: Endoscopy;  Laterality: N/A;   HERNIA REPAIR     UHR   HOT HEMOSTASIS  11/26/2021   Procedure: HOT HEMOSTASIS (ARGON PLASMA COAGULATION/BICAP);  Surgeon: Montez Morita, Quillian Quince, MD;  [facility];  Service: Gastroenterology;;   Venia Minks DILATION N/A 04/15/2020   Procedure: Keturah Shavers;  Surgeon: Daneil Dolin, MD;  [facility];  Service: Endoscopy;  Laterality: N/A;   POLYPECTOMY  04/15/2020   Procedure: POLYPECTOMY;  Surgeon: Daneil Dolin, MD;  [facility];  Service: Endoscopy;;  colon   ROTATOR CUFF REPAIR Bilateral    TYMPANOPLASTY      Family History  Problem Relation Age of Onset   Diabetes Mother    Heart attack Mother    Heart  attack Father    Heart attack Brother    Colon cancer Neg Hx    Stomach cancer Neg Hx     Allergies as of 04/21/2022 - Review Complete 03/13/2022  Allergen Reaction Noted   Caduet [amlodipine-atorvastatin] Swelling 09/18/2015    Social History   Socioeconomic History   Marital status: Divorced    Spouse name: Not on file   Number of children: 2   Years of education: Not on file   Highest education level: High school graduate  Occupational History   Occupation: Retired  Tobacco Use   Smoking status: Every Day    Packs/day: 2.00    Types: Cigarettes   Smokeless tobacco: Never  Vaping Use   Vaping Use: Never used  Substance and Sexual Activity   Alcohol use: Yes    Comment: very rare   Drug use: No   Sexual activity: Not on file  Other Topics Concern   Not on file  Social History Narrative   Not on file   Social Determinants of Health   Financial Resource Strain: Low Risk  (08/06/2020)   Overall Financial Resource Strain (CARDIA)    Difficulty of Paying Living Expenses: Not very hard  Food Insecurity: No Food Insecurity (08/02/2020)   Hunger Vital Sign    Worried About Running Out of Food in the Last Year: Never true    Ran Out of Food in the Last Year: Never true  Transportation Needs: No Transportation Needs (08/02/2020)   PRAPARE - Hydrologist (Medical): No    Lack of Transportation (Non-Medical): No  Physical Activity: Inactive (08/06/2020)   Exercise Vital Sign    Days of Exercise per Week: 0 days    Minutes of Exercise per Session: 0 min  Stress: No Stress Concern Present (08/06/2020)   [place]    Feeling of Stress : Not at all  Social Connections: Socially Isolated (08/06/2020)   Social Connection and Isolation Panel [NHANES]    Frequency of Communication with Friends and Family: Three times a week    Frequency of Social Gatherings with Friends and Family:  Three times a week    Attends Religious Services: Never    Active Member of Clubs or Organizations: No    Attends Archivist Meetings: Never    Marital Status: Divorced  Review of Systems   Gen: Denies fever, chills, anorexia. Denies fatigue, weakness, weight loss.  CV: Denies chest pain, palpitations, syncope, peripheral edema, and claudication. Resp: Denies dyspnea at rest, cough, wheezing, coughing up blood, and pleurisy. GI: See HPI Derm: Denies rash, itching, dry skin Psych: Denies depression, anxiety, memory loss, confusion. No homicidal or suicidal ideation.  Heme: Denies bruising, bleeding, and enlarged lymph nodes.   Physical Exam   There were no vitals taken for this visit.  General:   Alert and oriented. No distress noted. Pleasant and cooperative.  Head:  Normocephalic and atraumatic. Eyes:  Conjuctiva clear without scleral icterus. Mouth:  Oral mucosa pink and moist. Good dentition. No lesions. Lungs:  Clear to auscultation bilaterally. No wheezes, rales, or rhonchi. No distress.  Heart:  S1, S2 present without murmurs appreciated.  Abdomen:  +BS, soft, non-tender and non-distended. No rebound or guarding. No HSM or masses noted. Rectal: *** Msk:  Symmetrical without gross deformities. Normal posture. Extremities:  Without edema. Neurologic:  Alert and  oriented x4 Psych:  Alert and cooperative. Normal mood and affect.   Assessment  Gregory Alvarado is a 71 y.o. male with a history of COPD, HTN, diabetes, chronic respiratory failure on 4 L nasal cannula BiPAP at home, systolic and diastolic CHF, known history of small bowel AVMs*** presenting today for hospital follow up.   Chronic anemia/history of small bowel AVMs: Likely secondary to known gastric and small bowel AVMs.  Prior endoscopy history as stated in HPI.  Patient recent hospitalization with hemoglobin 5.7 on arrival, received multiple blood products.  Hemoglobin improved to 8 and on discharge  hemoglobin was 7.7.  He was advised to continue oral iron twice daily as well as PPI.  Colonoscopy performed in 2021 with inadequate prep and 2 polyps removed.  PLAN   *** CBC, iron panel Colonoscopy?    Venetia Night, MSN, FNP-BC, AGACNP-BC [facility]

## 2022-04-21 ENCOUNTER — Inpatient Hospital Stay: Payer: Medicare Other | Admitting: Gastroenterology

## 2022-04-22 ENCOUNTER — Encounter (HOSPITAL_COMMUNITY): Payer: Self-pay | Admitting: Hematology

## 2022-04-22 ENCOUNTER — Telehealth: Payer: Self-pay | Admitting: *Deleted

## 2022-04-22 ENCOUNTER — Telehealth: Payer: Self-pay | Admitting: Internal Medicine

## 2022-04-22 NOTE — Telephone Encounter (Signed)
Lmom informing patient that any needed labs would be ordered at the time of his office visit.

## 2022-04-22 NOTE — Telephone Encounter (Signed)
Pt showed up an hour late for his OV yesterday and is scheduled to come back tomorrow. He LMOM asking if he needed to do any labs prior. Please advise. [phone]

## 2022-04-22 NOTE — Telephone Encounter (Signed)
Patient left message, returning your call, please call [phone]

## 2022-04-22 NOTE — Progress Notes (Unsigned)
GI Office Note    Referring Provider: Celene Squibb, MD Primary Care Physician:  Celene Squibb, MD Primary Gastroenterologist: Dr. Abbey Chatters  Date:  04/23/2022  ID:  Gregory Alvarado, DOB Jun 25, 1951, MRN [id number]   Chief Complaint   Chief Complaint  Patient presents with   Follow-up    History of Present Illness  Gregory Alvarado is a 71 y.o. male with a history of COPD, HTN, diabetes, chronic respiratory failure on 4 L nasal cannula BiPAP at home, systolic and diastolic CHF, known history of small bowel AVMs presenting today for hospital follow up.    Patient last seen in the office 01/22/2021: Patient presented in follow-up of recent hospitalization.  He reports stools always being black, denies any BRBPR.  Reported he is having several bowel movements per day, sometimes solid and sometimes loose.  Reportedly was on oral iron.  He reported occasional heartburn, denied any abdominal pain, nausea/vomiting, daily NSAID use.  He did report baby aspirin and Effient daily.  He reported he is not interested in pursuing IV iron due to the expense.  It was also recommended for him to have updated colonoscopy in the near future, was not scheduled at the time of office visit given recent cardiac stent placement in December 2021.  He was advised to consider ultrasound Dopplers of his left lower extremity to rule out DVT which he declined.   Patient was recently hospitalized 03/12/2022 to 03/15/2022.  He was brought to the ED for unresponsiveness, found by his son.  He reported that he was dizzy before he passed out but did not know how long he was unconscious for.  He was found to have a hemoglobin of 5.7 on admission.  He was given 2 units PRBCs.  He reported ongoing dark stools and stated he had been feeling good for 6-8 weeks after prior hospitalization and then started feeling sluggish again.  He reported very little alcohol use and denies any NSAID use.  He reportedly stopped taking his oral iron at home  reporting constipation.  Denies any pica or worsening shortness of breath.  He reports he continues to smoke.  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 Recommendation to continue oral iron, monitor hemoglobin and transfuse for less than 7, and to update colonoscopy on outpatient basis.  Hemoglobin 7.7 on discharge.  Patient was advised to repeat CBC in 1 week.  Patient also is being evaluated for acute on chronic systolic and diastolic congestive heart failure, he received IV furosemide while inpatient and was to DC home with Lasix 40 mg daily.  Last echo in December 2021 with EF 35 to 40%.     Prior GI work-up: Colonoscopy August 2021 -inadequate preparation of the colon, two 6/8 mm polyps at the hepatic flexure   EGD August 2021 - normal esophagus s/p dilation, patchy gastric erythema, previously noted gastric ulcer completely healed, normal duodenum   EGD/enteroscopy November 2021 - multiple angioectasias in the duodenum and jejunum s/p APC therapy   EGD March 2023 - single recent bleeding angiodysplastic lesion in the stomach, 6 nonbleeding lesions in the duodenum, 6 nonbleeding lesions in the jejunum all treated with APC therapy.    Today:  Energy level has not been good. Stool stays black due to the iron once a day. Has a bowel movement either every  other day or every day. When he does go he may go 3 times in that day. Stools are soft for the most part but are usually mall blobs. Once in a while he has to strain. When it comes out he states "you better look out". Denies nausea, vomiting, abdominal pain.  He does report an episode a couple of weeks ago where he was sitting on his couch and fell onto the floor, he is unsure if he fell asleep or if he actually passed out.  He denied laying there  for any extended period of time.  He denies frequent dizziness or lightheadedness upon standing or while doing things around his house.  Had blood work done with Dr. Nevada Crane about 3 weeks ago.  He reports he has attempted iron infusions before in the past but his insurance only covered 1 dose and would not cover it anymore and stated he could not afford them.  He stated he did not want to take the iron twice daily because of causing constipation/firm stools.  He states that he was told with his last colonoscopy attempt that he was not cleaned out all the way and has stated he does not want to attempt another colonoscopy again.  He denies any BRBPR, reports his stools are always black given the iron he takes.  He currently is not interested in making any medication changes and is fine with monitoring his symptoms.  Blood sugars have been decent - 130s. Denies chest pain, worsening shortness of breath.    Current Outpatient Medications  Medication Sig Dispense Refill   acetaminophen (TYLENOL) 325 MG tablet Take 2 tablets (650 mg total) by mouth every 6 (six) hours as needed for mild pain (or Fever >/= 101). 12 tablet 0   atorvastatin (LIPITOR) 80 MG tablet Take 80 mg by mouth daily.     cyanocobalamin (,VITAMIN B-12,) 1000 MCG/ML injection Inject 1,000 mcg into the muscle every 30 (thirty) days.     ferrous sulfate 325 (65 FE) MG tablet Take 1 tablet (325 mg total) by mouth 2 (two) times daily with a meal. 60 tablet 3   furosemide (LASIX) 40 MG tablet Take 1 tablet (40 mg total) by mouth daily. 30 tablet 1   gabapentin (NEURONTIN) 300 MG capsule Take 1 capsule by mouth 4 (four) times daily.     insulin NPH-regular Human (NOVOLIN 70/30) (70-30) 100 UNIT/ML injection Inject 70 Units into the skin in the morning and at bedtime.     Ipratropium-Albuterol (COMBIVENT RESPIMAT) 20-100 MCG/ACT AERS respimat Inhale 1 puff into the lungs every 6 (six) hours as needed for wheezing or shortness of breath. 4 g 1    levocetirizine (XYZAL) 5 MG tablet Take 5 mg by mouth at bedtime.     lisinopril (ZESTRIL) 10 MG tablet Take 1 tablet (10 mg total) by mouth daily. 30 tablet 1   metoprolol succinate (TOPROL-XL) 100 MG 24 hr tablet TAKE 1 AND 1/2 TABLETS BY MOUTH ONCE A DAY. 30 tablet 0   nitroGLYCERIN (NITROSTAT) 0.4 MG SL tablet DISSOLVE 1 TABLET SUBLINGUALLY AS NEEDED FOR CHEST PAIN, MAY REPEAT EVERY 5 MINUTES. AFTER 3 CALL 911. 25 tablet 0   traMADol (ULTRAM) 50 MG tablet Take 50-100 mg by mouth in the morning and at bedtime.     zolpidem (AMBIEN) 5 MG tablet Take 5 mg by mouth at bedtime as needed for sleep.     No current facility-administered medications for this visit.    Past Medical History:  Diagnosis Date   Anemia    Arthritis    left shoulder   COPD (chronic obstructive pulmonary disease) (HCC)    Coronary artery disease 09/11/2020   NSTEMI and 3 stents in [facility]   Diabetes mellitus without complication ([place])    Dyslipidemia    Hypertension    Insomnia    Insomnia    Morbid obesity ([place])    Tobacco use     Past Surgical History:  Procedure Laterality Date   BIOPSY  10/28/2019   Procedure: BIOPSY;  Surgeon: Danie Binder, MD;  [facility];  Service: Endoscopy;;  gastric   BIOPSY  04/15/2020   Procedure: BIOPSY;  Surgeon: Daneil Dolin, MD;  [facility];  Service: Endoscopy;;  gastric   CARDIAC CATHETERIZATION  08/2020   3 stents placed   COLON RESECTION  1990s?   DIVERTICULITIS   COLONOSCOPY WITH PROPOFOL N/A 04/15/2020   Rourk: Poor colon prep. two 6 to 8 mm polyps removed from the hepatic flexure, tubular adenomas.  Repeat colonoscopy later this year.   ENTEROSCOPY N/A 11/26/2021   Procedure: ENTEROSCOPY;  Surgeon: Harvel Quale, MD;  [facility];  Service: Gastroenterology;  Laterality: N/A;   ENTEROSCOPY N/A 03/13/2022   Procedure: ENTEROSCOPY;  Surgeon: [name], DO;  [facility];  Service: Endoscopy;   Laterality: N/A;   ESOPHAGOGASTRODUODENOSCOPY (EGD) WITH PROPOFOL N/A 10/28/2019   Dr. Oneida Alar: Multiple cratered gastric ulcers, gastritis/duodenitis.  Path showed H. pylori.  Patient treated with amoxicillin/Biaxin/PPI twice daily.   ESOPHAGOGASTRODUODENOSCOPY (EGD) WITH PROPOFOL N/A 04/15/2020   Rourk: Patchy gastric erythema status post biopsy to document eradication of H. pylori.  Biopsy showed persistent H. pylori.  Previously noted gastric ulcer is completely healed.   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   ESOPHAGOGASTRODUODENOSCOPY (EGD) WITH PROPOFOL N/A 03/13/2022   Procedure: ESOPHAGOGASTRODUODENOSCOPY (EGD) WITH PROPOFOL;  Surgeon: [name], DO;  [facility];  Service: Endoscopy;  Laterality: N/A;   HERNIA REPAIR     UHR   HOT HEMOSTASIS  11/26/2021   Procedure: HOT HEMOSTASIS (ARGON PLASMA COAGULATION/BICAP);  Surgeon: Montez Morita, Quillian Quince, MD;  [facility];  Service: Gastroenterology;;   Venia Minks DILATION N/A 04/15/2020   Procedure: Keturah Shavers;  Surgeon: Daneil Dolin, MD;  [facility];  Service: Endoscopy;  Laterality: N/A;   POLYPECTOMY  04/15/2020   Procedure: POLYPECTOMY;  Surgeon: Daneil Dolin, MD;  [facility];  Service: Endoscopy;;  colon   ROTATOR CUFF REPAIR Bilateral    TYMPANOPLASTY      Family History  Problem Relation Age of Onset   Diabetes Mother    Heart attack Mother    Heart attack Father    Heart attack Brother    Colon cancer Neg Hx    Stomach cancer Neg Hx     Allergies as of 04/23/2022 - Review Complete 04/23/2022  Allergen Reaction Noted   Caduet [amlodipine-atorvastatin] Swelling 09/18/2015    Social History   Socioeconomic History   Marital status: Divorced    Spouse name: Not on file   Number of children: 2   Years of education: Not on file   Highest education  level: High school graduate  Occupational History   Occupation: Retired  Tobacco Use   Smoking status: Every Day    Packs/day: 2.00  Types: Cigarettes   Smokeless tobacco: Never  Vaping Use   Vaping Use: Never used  Substance and Sexual Activity   Alcohol use: Yes    Comment: very rare   Drug use: No   Sexual activity: Not on file  Other Topics Concern   Not on file  Social History Narrative   Not on file   Social Determinants of Health   Financial Resource Strain: Low Risk  (08/06/2020)   Overall Financial Resource Strain (CARDIA)    Difficulty of Paying Living Expenses: Not very hard  Food Insecurity: No Food Insecurity (08/02/2020)   Hunger Vital Sign    Worried About Running Out of Food in the Last Year: Never true    Ran Out of Food in the Last Year: Never true  Transportation Needs: No Transportation Needs (08/02/2020)   PRAPARE - Hydrologist (Medical): No    Lack of Transportation (Non-Medical): No  Physical Activity: Inactive (08/06/2020)   Exercise Vital Sign    Days of Exercise per Week: 0 days    Minutes of Exercise per Session: 0 min  Stress: No Stress Concern Present (08/06/2020)   [place]    Feeling of Stress : Not at all  Social Connections: Socially Isolated (08/06/2020)   Social Connection and Isolation Panel [NHANES]    Frequency of Communication with Friends and Family: Three times a week    Frequency of Social Gatherings with Friends and Family: Three times a week    Attends Religious Services: Never    Active Member of Clubs or Organizations: No    Attends Archivist Meetings: Never    Marital Status: Divorced     Review of Systems   Gen: Denies fever, chills, anorexia. Denies fatigue, weakness, weight loss.  CV: Denies chest pain, palpitations, syncope, peripheral edema, and claudication. Resp: Denies dyspnea at rest, cough,  wheezing, coughing up blood, and pleurisy. GI: See HPI Derm: Denies rash, itching, dry skin Psych: Denies depression, anxiety, memory loss, confusion. No homicidal or suicidal ideation.  Heme: Denies bruising, bleeding, and enlarged lymph nodes.   Physical Exam   BP (!) 153/66 (BP Location: Right Arm, Patient Position: Sitting, Cuff Size: Large)   Pulse (!) 110   Temp 98 F (36.7 C) (Temporal)   Ht '5\' 9"'$  (1.753 m)   Wt 290 lb 3.2 oz (131.6 kg)   BMI 42.86 kg/m   Patient unable to get onto exam table.   General:   Alert and oriented. Cooperative.  Dirty clothes.  Head:  Normocephalic and atraumatic. Eyes:  Conjuctiva clear without scleral icterus. Mouth:  Oral mucosa pink and moist.  Poor dentition. Lungs: Expiratory wheezing bilaterally, diminished in the bases Heart:  S1, S2 present without murmurs appreciated.  Abdomen:  exam limited due to completing in the chair. +BS, soft, non-tender and non-distended. No rebound or guarding. No HSM or masses noted. Rectal: deferred Msk: Short stride gait, needs wheelchair to get around the office. Extremities:  Without edema.  Patchy dry scaly skin. Neurologic:  Alert and  oriented x4 Psych:  Alert and cooperative. Normal mood and affect.   Assessment  Gregory Alvarado is a 71 y.o. male with a history of COPD, HTN, diabetes, chronic respiratory failure on 4 L nasal cannula BiPAP at home, systolic and diastolic CHF, known history of small bowel AVMs presenting today for hospital follow-up.   Chronic anemia/history of small bowel AVMs: Likely secondary  to known gastric and small bowel AVMs.  Prior endoscopy history as stated in HPI.  Patient recent hospitalization with hemoglobin 5.7 on arrival, received multiple blood products.  Hemoglobin improved to 8 and on discharge hemoglobin was 7.7.  He was advised to continue oral iron twice daily as well as PPI. Colonoscopy performed in 2021 with inadequate prep and 2 polyps removed.  Most recent labs  performed 03/20/2022 with hemoglobin 8, MCV 77.  He reports 1 episode of passing out versus falling asleep and falling to the floor a couple weeks ago.  He reports his energy is always low.  Stools have been black given his iron but denies any BRBPR.  He denies any other dizziness, lightheadedness.  Patient states he does not want to perform colonoscopy.  I discussed with him that he could have colonic AVMs as well.  He reports he does not like to take the iron because it causes him firm stools.  His hemoglobin has been stable around the 8 range.  I would prefer for him to take oral iron twice daily which she states he may consider.  I have asked him continue his PPI and to start MiraLAX 17 g daily to help with constipation.  We discussed ED return precautions and that if he has another episode of syncope that he should proceed to the ED.  I offered hematology referral to again discuss possible IV iron infusions but he declined.  Will continue to monitor.  Will follow-up in 3 to 4 months and discuss repeat blood work again at that time.  Patient has shortness of breath at baseline but is slightly worse today while in clinic, is usually on 3 to 4 L nasal cannula.  He currently does not have any oxygen supplies to carry with him to doctors appointments, grocery shopping, etc.  Advised him to follow-up with his PCP so he can get portable oxygen that he is able to carry.  PLAN    Continue pantoprazole 40 mg daily Continue oral iron daily, discussed increasing to twice daily and he is hesitant Start MiraLAX 17 g daily. ED precautions discussed. Recommended hematology referral -patient declined Recommended colonoscopy -patient declined. Follow-up in 3 to 4 months PCP follow up for portable oxygen.     Venetia Night, MSN, FNP-BC, AGACNP-BC [facility]

## 2022-04-22 NOTE — Telephone Encounter (Signed)
Returned patients call informing him that labs will be ordered at the time of his ov and to be here for his appt at 10:45 on the 10th. Pt verbalized understanding.

## 2022-04-23 ENCOUNTER — Encounter: Payer: Self-pay | Admitting: Gastroenterology

## 2022-04-23 ENCOUNTER — Ambulatory Visit: Payer: Medicare Other | Admitting: Gastroenterology

## 2022-04-23 VITALS — BP 153/66 | HR 110 | Temp 98.0°F | Ht 69.0 in | Wt 290.2 lb

## 2022-04-23 DIAGNOSIS — D638 Anemia in other chronic diseases classified elsewhere: Secondary | ICD-10-CM

## 2022-04-23 DIAGNOSIS — K5520 Angiodysplasia of colon without hemorrhage: Secondary | ICD-10-CM

## 2022-04-23 DIAGNOSIS — K5900 Constipation, unspecified: Secondary | ICD-10-CM

## 2022-04-23 NOTE — Patient Instructions (Signed)
I want you to continue taking your iron daily.  I would prefer for you to take it twice daily if able.  If you are experiencing more constipation I want you to start taking some MiraLAX (17 g) or 1 capful daily.  I will have you follow-up in 3 to 4 months.  It was a pleasure to see you today. I want to create trusting relationships with patients. If you receive a survey regarding your visit,  I greatly appreciate you taking time to fill this out on paper or through your MyChart. I value your feedback.  Venetia Night, MSN, FNP-BC, AGACNP-BC [facility]

## 2022-04-30 ENCOUNTER — Encounter (HOSPITAL_COMMUNITY): Payer: Self-pay | Admitting: Hematology

## 2022-05-04 ENCOUNTER — Encounter (HOSPITAL_COMMUNITY): Payer: Self-pay | Admitting: Hematology

## 2022-05-06 DIAGNOSIS — J449 Chronic obstructive pulmonary disease, unspecified: Secondary | ICD-10-CM | POA: Diagnosis not present

## 2022-05-11 ENCOUNTER — Encounter (HOSPITAL_COMMUNITY): Payer: Self-pay | Admitting: Hematology

## 2022-05-13 ENCOUNTER — Other Ambulatory Visit: Payer: Self-pay | Admitting: Cardiology

## 2022-05-13 DIAGNOSIS — I5022 Chronic systolic (congestive) heart failure: Secondary | ICD-10-CM | POA: Diagnosis not present

## 2022-05-13 DIAGNOSIS — I2510 Atherosclerotic heart disease of native coronary artery without angina pectoris: Secondary | ICD-10-CM | POA: Diagnosis not present

## 2022-05-13 DIAGNOSIS — J9611 Chronic respiratory failure with hypoxia: Secondary | ICD-10-CM | POA: Diagnosis not present

## 2022-05-13 DIAGNOSIS — J449 Chronic obstructive pulmonary disease, unspecified: Secondary | ICD-10-CM | POA: Diagnosis not present

## 2022-05-13 DIAGNOSIS — J9621 Acute and chronic respiratory failure with hypoxia: Secondary | ICD-10-CM | POA: Diagnosis not present

## 2022-05-13 DIAGNOSIS — E1169 Type 2 diabetes mellitus with other specified complication: Secondary | ICD-10-CM | POA: Diagnosis not present

## 2022-05-13 DIAGNOSIS — K922 Gastrointestinal hemorrhage, unspecified: Secondary | ICD-10-CM | POA: Diagnosis not present

## 2022-05-13 DIAGNOSIS — I10 Essential (primary) hypertension: Secondary | ICD-10-CM | POA: Diagnosis not present

## 2022-05-13 DIAGNOSIS — D72829 Elevated white blood cell count, unspecified: Secondary | ICD-10-CM | POA: Diagnosis not present

## 2022-05-13 DIAGNOSIS — G629 Polyneuropathy, unspecified: Secondary | ICD-10-CM | POA: Diagnosis not present

## 2022-05-13 DIAGNOSIS — I5043 Acute on chronic combined systolic (congestive) and diastolic (congestive) heart failure: Secondary | ICD-10-CM | POA: Diagnosis not present

## 2022-05-13 DIAGNOSIS — D509 Iron deficiency anemia, unspecified: Secondary | ICD-10-CM | POA: Diagnosis not present

## 2022-05-21 ENCOUNTER — Other Ambulatory Visit: Payer: Self-pay | Admitting: Cardiology

## 2022-05-22 ENCOUNTER — Other Ambulatory Visit: Payer: Self-pay | Admitting: Cardiology

## 2022-06-06 DIAGNOSIS — J449 Chronic obstructive pulmonary disease, unspecified: Secondary | ICD-10-CM | POA: Diagnosis not present

## 2022-07-06 DIAGNOSIS — J449 Chronic obstructive pulmonary disease, unspecified: Secondary | ICD-10-CM | POA: Diagnosis not present

## 2022-07-10 ENCOUNTER — Other Ambulatory Visit: Payer: Self-pay | Admitting: Cardiology

## 2022-07-24 ENCOUNTER — Encounter (HOSPITAL_COMMUNITY): Payer: Self-pay | Admitting: Hematology

## 2022-07-25 ENCOUNTER — Inpatient Hospital Stay (HOSPITAL_COMMUNITY)
Admission: EM | Admit: 2022-07-25 | Discharge: 2022-07-31 | DRG: 915 | Disposition: A | Discharge status: Home Health | Payer: Medicare Other | Attending: Internal Medicine | Admitting: Internal Medicine

## 2022-07-25 ENCOUNTER — Inpatient Hospital Stay (HOSPITAL_COMMUNITY): Payer: Medicare Other

## 2022-07-25 ENCOUNTER — Emergency Department (HOSPITAL_COMMUNITY): Payer: Medicare Other | Admitting: Anesthesiology

## 2022-07-25 ENCOUNTER — Encounter (HOSPITAL_COMMUNITY): Payer: Self-pay

## 2022-07-25 ENCOUNTER — Emergency Department (HOSPITAL_COMMUNITY): Payer: Medicare Other

## 2022-07-25 ENCOUNTER — Encounter (HOSPITAL_COMMUNITY): Payer: Self-pay | Admitting: Emergency Medicine

## 2022-07-25 ENCOUNTER — Other Ambulatory Visit: Payer: Self-pay

## 2022-07-25 DIAGNOSIS — Z833 Family history of diabetes mellitus: Secondary | ICD-10-CM

## 2022-07-25 DIAGNOSIS — G4700 Insomnia, unspecified: Secondary | ICD-10-CM | POA: Diagnosis present

## 2022-07-25 DIAGNOSIS — Z79891 Long term (current) use of opiate analgesic: Secondary | ICD-10-CM

## 2022-07-25 DIAGNOSIS — N179 Acute kidney failure, unspecified: Secondary | ICD-10-CM | POA: Diagnosis not present

## 2022-07-25 DIAGNOSIS — F17210 Nicotine dependence, cigarettes, uncomplicated: Secondary | ICD-10-CM | POA: Diagnosis present

## 2022-07-25 DIAGNOSIS — I469 Cardiac arrest, cause unspecified: Secondary | ICD-10-CM | POA: Insufficient documentation

## 2022-07-25 DIAGNOSIS — J9600 Acute respiratory failure, unspecified whether with hypoxia or hypercapnia: Secondary | ICD-10-CM | POA: Diagnosis not present

## 2022-07-25 DIAGNOSIS — T884XXA Failed or difficult intubation, initial encounter: Secondary | ICD-10-CM | POA: Diagnosis present

## 2022-07-25 DIAGNOSIS — Z6841 Body Mass Index (BMI) 40.0 and over, adult: Secondary | ICD-10-CM | POA: Diagnosis not present

## 2022-07-25 DIAGNOSIS — Z86010 Personal history of colonic polyps: Secondary | ICD-10-CM

## 2022-07-25 DIAGNOSIS — R6889 Other general symptoms and signs: Secondary | ICD-10-CM | POA: Diagnosis not present

## 2022-07-25 DIAGNOSIS — J9621 Acute and chronic respiratory failure with hypoxia: Secondary | ICD-10-CM | POA: Diagnosis present

## 2022-07-25 DIAGNOSIS — I249 Acute ischemic heart disease, unspecified: Secondary | ICD-10-CM

## 2022-07-25 DIAGNOSIS — E785 Hyperlipidemia, unspecified: Secondary | ICD-10-CM | POA: Diagnosis present

## 2022-07-25 DIAGNOSIS — Z794 Long term (current) use of insulin: Secondary | ICD-10-CM

## 2022-07-25 DIAGNOSIS — X58XXXA Exposure to other specified factors, initial encounter: Secondary | ICD-10-CM | POA: Diagnosis present

## 2022-07-25 DIAGNOSIS — E8809 Other disorders of plasma-protein metabolism, not elsewhere classified: Secondary | ICD-10-CM | POA: Diagnosis not present

## 2022-07-25 DIAGNOSIS — T783XXS Angioneurotic edema, sequela: Secondary | ICD-10-CM | POA: Diagnosis not present

## 2022-07-25 DIAGNOSIS — J9690 Respiratory failure, unspecified, unspecified whether with hypoxia or hypercapnia: Secondary | ICD-10-CM | POA: Diagnosis not present

## 2022-07-25 DIAGNOSIS — J9601 Acute respiratory failure with hypoxia: Secondary | ICD-10-CM | POA: Diagnosis not present

## 2022-07-25 DIAGNOSIS — I10 Essential (primary) hypertension: Secondary | ICD-10-CM | POA: Diagnosis not present

## 2022-07-25 DIAGNOSIS — T783XXA Angioneurotic edema, initial encounter: Secondary | ICD-10-CM | POA: Diagnosis not present

## 2022-07-25 DIAGNOSIS — Z888 Allergy status to other drugs, medicaments and biological substances status: Secondary | ICD-10-CM

## 2022-07-25 DIAGNOSIS — R918 Other nonspecific abnormal finding of lung field: Secondary | ICD-10-CM | POA: Diagnosis not present

## 2022-07-25 DIAGNOSIS — I110 Hypertensive heart disease with heart failure: Secondary | ICD-10-CM | POA: Diagnosis not present

## 2022-07-25 DIAGNOSIS — J449 Chronic obstructive pulmonary disease, unspecified: Secondary | ICD-10-CM | POA: Diagnosis not present

## 2022-07-25 DIAGNOSIS — I2489 Other forms of acute ischemic heart disease: Secondary | ICD-10-CM

## 2022-07-25 DIAGNOSIS — E1165 Type 2 diabetes mellitus with hyperglycemia: Secondary | ICD-10-CM

## 2022-07-25 DIAGNOSIS — Z603 Acculturation difficulty: Secondary | ICD-10-CM | POA: Diagnosis present

## 2022-07-25 DIAGNOSIS — Z8711 Personal history of peptic ulcer disease: Secondary | ICD-10-CM

## 2022-07-25 DIAGNOSIS — Z743 Need for continuous supervision: Secondary | ICD-10-CM | POA: Diagnosis not present

## 2022-07-25 DIAGNOSIS — Z955 Presence of coronary angioplasty implant and graft: Secondary | ICD-10-CM

## 2022-07-25 DIAGNOSIS — I429 Cardiomyopathy, unspecified: Secondary | ICD-10-CM | POA: Diagnosis not present

## 2022-07-25 DIAGNOSIS — I499 Cardiac arrhythmia, unspecified: Secondary | ICD-10-CM | POA: Diagnosis not present

## 2022-07-25 DIAGNOSIS — J9811 Atelectasis: Secondary | ICD-10-CM | POA: Diagnosis not present

## 2022-07-25 DIAGNOSIS — J9602 Acute respiratory failure with hypercapnia: Secondary | ICD-10-CM | POA: Diagnosis not present

## 2022-07-25 DIAGNOSIS — J9622 Acute and chronic respiratory failure with hypercapnia: Secondary | ICD-10-CM | POA: Diagnosis not present

## 2022-07-25 DIAGNOSIS — R579 Shock, unspecified: Secondary | ICD-10-CM

## 2022-07-25 DIAGNOSIS — J811 Chronic pulmonary edema: Secondary | ICD-10-CM | POA: Diagnosis not present

## 2022-07-25 DIAGNOSIS — I21A1 Myocardial infarction type 2: Secondary | ICD-10-CM | POA: Diagnosis not present

## 2022-07-25 DIAGNOSIS — J9620 Acute and chronic respiratory failure, unspecified whether with hypoxia or hypercapnia: Secondary | ICD-10-CM | POA: Diagnosis not present

## 2022-07-25 DIAGNOSIS — T464X5A Adverse effect of angiotensin-converting-enzyme inhibitors, initial encounter: Secondary | ICD-10-CM | POA: Diagnosis present

## 2022-07-25 DIAGNOSIS — R578 Other shock: Secondary | ICD-10-CM | POA: Diagnosis present

## 2022-07-25 DIAGNOSIS — I5042 Chronic combined systolic (congestive) and diastolic (congestive) heart failure: Secondary | ICD-10-CM | POA: Diagnosis present

## 2022-07-25 DIAGNOSIS — R609 Edema, unspecified: Secondary | ICD-10-CM | POA: Diagnosis not present

## 2022-07-25 DIAGNOSIS — E66813 Obesity, class 3: Secondary | ICD-10-CM | POA: Diagnosis present

## 2022-07-25 DIAGNOSIS — Z4682 Encounter for fitting and adjustment of non-vascular catheter: Secondary | ICD-10-CM | POA: Diagnosis not present

## 2022-07-25 DIAGNOSIS — I252 Old myocardial infarction: Secondary | ICD-10-CM | POA: Diagnosis not present

## 2022-07-25 DIAGNOSIS — D509 Iron deficiency anemia, unspecified: Secondary | ICD-10-CM | POA: Diagnosis present

## 2022-07-25 DIAGNOSIS — Z79899 Other long term (current) drug therapy: Secondary | ICD-10-CM | POA: Diagnosis not present

## 2022-07-25 DIAGNOSIS — E875 Hyperkalemia: Secondary | ICD-10-CM | POA: Diagnosis not present

## 2022-07-25 DIAGNOSIS — Z9981 Dependence on supplemental oxygen: Secondary | ICD-10-CM

## 2022-07-25 DIAGNOSIS — I517 Cardiomegaly: Secondary | ICD-10-CM | POA: Diagnosis not present

## 2022-07-25 DIAGNOSIS — I2510 Atherosclerotic heart disease of native coronary artery without angina pectoris: Secondary | ICD-10-CM | POA: Diagnosis present

## 2022-07-25 DIAGNOSIS — R188 Other ascites: Secondary | ICD-10-CM | POA: Diagnosis not present

## 2022-07-25 DIAGNOSIS — Z8249 Family history of ischemic heart disease and other diseases of the circulatory system: Secondary | ICD-10-CM

## 2022-07-25 DIAGNOSIS — T783XXD Angioneurotic edema, subsequent encounter: Secondary | ICD-10-CM | POA: Diagnosis not present

## 2022-07-25 DIAGNOSIS — R0602 Shortness of breath: Secondary | ICD-10-CM | POA: Diagnosis not present

## 2022-07-25 DIAGNOSIS — Z8719 Personal history of other diseases of the digestive system: Secondary | ICD-10-CM

## 2022-07-25 DIAGNOSIS — Z4802 Encounter for removal of sutures: Secondary | ICD-10-CM

## 2022-07-25 DIAGNOSIS — M19012 Primary osteoarthritis, left shoulder: Secondary | ICD-10-CM | POA: Diagnosis present

## 2022-07-25 LAB — COMPREHENSIVE METABOLIC PANEL
ALT: 9 U/L (ref 0–44)
AST: 12 U/L — ABNORMAL LOW (ref 15–41)
Albumin: 3.6 g/dL (ref 3.5–5.0)
Alkaline Phosphatase: 91 U/L (ref 38–126)
Anion gap: 6 (ref 5–15)
BUN: 26 mg/dL — ABNORMAL HIGH (ref 8–23)
CO2: 31 mmol/L (ref 22–32)
Calcium: 9.6 mg/dL (ref 8.9–10.3)
Chloride: 100 mmol/L (ref 98–111)
Creatinine, Ser: 1.24 mg/dL (ref 0.61–1.24)
GFR, Estimated: >60 mL/min (ref >60)
Glucose, Bld: 215 mg/dL — ABNORMAL HIGH (ref 70–99)
Potassium: 5.3 mmol/L — ABNORMAL HIGH (ref 3.5–5.1)
Sodium: 137 mmol/L (ref 135–145)
Total Bilirubin: 0.3 mg/dL (ref 0.3–1.2)
Total Protein: 7.4 g/dL (ref 6.5–8.1)

## 2022-07-25 LAB — CBC WITH DIFFERENTIAL/PLATELET
Abs Immature Granulocytes: 0.04 10*3/uL (ref 0.00–0.07)
Basophils Absolute: 0.1 10*3/uL (ref 0.0–0.1)
Basophils Relative: 0 %
Eosinophils Absolute: 0.2 10*3/uL (ref 0.0–0.5)
Eosinophils Relative: 2 %
HCT: 37 % — ABNORMAL LOW (ref 39.0–52.0)
Hemoglobin: 10.4 g/dL — ABNORMAL LOW (ref 13.0–17.0)
Immature Granulocytes: 0 %
Lymphocytes Relative: 7 %
Lymphs Abs: 0.8 10*3/uL (ref 0.7–4.0)
MCH: 23.2 pg — ABNORMAL LOW (ref 26.0–34.0)
MCHC: 28.1 g/dL — ABNORMAL LOW (ref 30.0–36.0)
MCV: 82.4 fL (ref 80.0–100.0)
Monocytes Absolute: 0.8 10*3/uL (ref 0.1–1.0)
Monocytes Relative: 7 %
Neutro Abs: 9.7 10*3/uL — ABNORMAL HIGH (ref 1.7–7.7)
Neutrophils Relative %: 84 %
Platelets: 221 10*3/uL (ref 150–400)
RBC: 4.49 MIL/uL (ref 4.22–5.81)
RDW: 17.1 % — ABNORMAL HIGH (ref 11.5–15.5)
WBC: 11.5 10*3/uL — ABNORMAL HIGH (ref 4.0–10.5)
nRBC: 0 % (ref 0.0–0.2)

## 2022-07-25 LAB — MRSA NEXT GEN BY PCR, NASAL: MRSA by PCR Next Gen: NOT DETECTED

## 2022-07-25 LAB — BLOOD GAS, ARTERIAL
Acid-Base Excess: 2.4 mmol/L — ABNORMAL HIGH (ref 0.0–2.0)
Bicarbonate: 32 mmol/L — ABNORMAL HIGH (ref 20.0–28.0)
Drawn by: 22179
O2 Saturation: 98.5 %
Patient temperature: 37.1
pCO2 arterial: 80 mmHg (ref 32–48)
pH, Arterial: 7.21 — ABNORMAL LOW (ref 7.35–7.45)
pO2, Arterial: 96 mmHg (ref 83–108)

## 2022-07-25 LAB — GLUCOSE, CAPILLARY
Glucose-Capillary: 322 mg/dL — ABNORMAL HIGH (ref 70–99)
Glucose-Capillary: 347 mg/dL — ABNORMAL HIGH (ref 70–99)

## 2022-07-25 LAB — TROPONIN I (HIGH SENSITIVITY)
Troponin I (High Sensitivity): 18 ng/L — ABNORMAL HIGH (ref <18)
Troponin I (High Sensitivity): 26 ng/L — ABNORMAL HIGH (ref <18)

## 2022-07-25 LAB — BRAIN NATRIURETIC PEPTIDE: B Natriuretic Peptide: 31 pg/mL (ref 0.0–100.0)

## 2022-07-25 MED ORDER — ALBUTEROL SULFATE (2.5 MG/3ML) 0.083% IN NEBU
INHALATION_SOLUTION | RESPIRATORY_TRACT | Status: AC
  Administered 2022-07-25: 5 mg
  Filled 2022-07-25: qty 6

## 2022-07-25 MED ORDER — METOCLOPRAMIDE HCL 5 MG/ML IJ SOLN
INTRAMUSCULAR | Status: AC
  Administered 2022-07-25: 10 mg via INTRAVENOUS
  Filled 2022-07-25: qty 2

## 2022-07-25 MED ORDER — LABETALOL HCL 5 MG/ML IV SOLN
20.0000 mg | Freq: Once | INTRAVENOUS | Status: AC
  Administered 2022-07-25: 5 mg via INTRAVENOUS
  Filled 2022-07-25: qty 4

## 2022-07-25 MED ORDER — INSULIN ASPART 100 UNIT/ML IJ SOLN
0.0000 [IU] | INTRAMUSCULAR | Status: DC
  Administered 2022-07-25: 15 [IU] via SUBCUTANEOUS
  Administered 2022-07-26: 20 [IU] via SUBCUTANEOUS

## 2022-07-25 MED ORDER — EPINEPHRINE 1 MG/10ML IJ SOSY
PREFILLED_SYRINGE | INTRAMUSCULAR | Status: AC | PRN
  Administered 2022-07-25: 1 mg via INTRAVENOUS

## 2022-07-25 MED ORDER — NOREPINEPHRINE 4 MG/250ML-% IV SOLN
0.0000 ug/min | INTRAVENOUS | Status: DC
  Filled 2022-07-25: qty 250

## 2022-07-25 MED ORDER — GLYCOPYRROLATE 0.2 MG/ML IJ SOLN
0.6000 mg | Freq: Once | INTRAMUSCULAR | Status: AC
  Administered 2022-07-25: 0.2 mg via INTRAVENOUS
  Filled 2022-07-25: qty 3

## 2022-07-25 MED ORDER — PROPOFOL 10 MG/ML IV BOLUS: 0.5000 mg/kg | Freq: Once | INTRAVENOUS | Status: AC

## 2022-07-25 MED ORDER — EPINEPHRINE HCL 5 MG/250ML IV SOLN IN NS
0.5000 ug/min | INTRAVENOUS | Status: DC
  Administered 2022-07-25: 0.5 ug/min via INTRAVENOUS
  Administered 2022-07-26: 5 ug/min via INTRAVENOUS
  Filled 2022-07-25 (×3): qty 250

## 2022-07-25 MED ORDER — LIDOCAINE HCL (PF) 2 % IJ SOLN
INTRAMUSCULAR | Status: AC
  Administered 2022-07-25: 1 mL
  Filled 2022-07-25: qty 10

## 2022-07-25 MED ORDER — PHENYLEPHRINE 80 MCG/ML (10ML) SYRINGE FOR IV PUSH (FOR BLOOD PRESSURE SUPPORT): 80.0000 ug | PREFILLED_SYRINGE | Freq: Once | INTRAVENOUS | Status: AC | PRN

## 2022-07-25 MED ORDER — MIDAZOLAM HCL 2 MG/2ML IJ SOLN
INTRAMUSCULAR | Status: AC
  Administered 2022-07-25: 1 mg via INTRAVENOUS
  Filled 2022-07-25: qty 2

## 2022-07-25 MED ORDER — IPRATROPIUM BROMIDE 0.02 % IN SOLN
RESPIRATORY_TRACT | Status: AC
  Administered 2022-07-25: 0.5 mg
  Filled 2022-07-25: qty 2.5

## 2022-07-25 MED ORDER — DIPHENHYDRAMINE HCL 50 MG/ML IJ SOLN: 50.0000 mg | Freq: Four times a day (QID) | INTRAMUSCULAR | Status: DC

## 2022-07-25 MED ORDER — ROCURONIUM BROMIDE 10 MG/ML (PF) SYRINGE
PREFILLED_SYRINGE | INTRAVENOUS | Status: AC
  Filled 2022-07-25: qty 10

## 2022-07-25 MED ORDER — METHYLPREDNISOLONE SODIUM SUCC 125 MG IJ SOLR: 125.0000 mg | Freq: Two times a day (BID) | INTRAMUSCULAR | Status: DC

## 2022-07-25 MED ORDER — FENTANYL CITRATE PF 50 MCG/ML IJ SOSY
25.0000 ug | PREFILLED_SYRINGE | INTRAMUSCULAR | Status: DC | PRN
  Filled 2022-07-25: qty 1

## 2022-07-25 MED ORDER — ALBUTEROL SULFATE (2.5 MG/3ML) 0.083% IN NEBU
3.0000 mL | INHALATION_SOLUTION | RESPIRATORY_TRACT | Status: DC | PRN
  Administered 2022-07-27 – 2022-07-28 (×3): 3 mL via RESPIRATORY_TRACT
  Filled 2022-07-25 (×3): qty 3

## 2022-07-25 MED ORDER — LABETALOL HCL 5 MG/ML IV SOLN
10.0000 mg | Freq: Once | INTRAVENOUS | Status: AC
  Administered 2022-07-25: 5 mg via INTRAVENOUS

## 2022-07-25 MED ORDER — DOCUSATE SODIUM 50 MG/5ML PO LIQD
100.0000 mg | Freq: Two times a day (BID) | ORAL | Status: DC
  Administered 2022-07-26 (×2): 100 mg
  Filled 2022-07-25 (×2): qty 10

## 2022-07-25 MED ORDER — METHYLPREDNISOLONE SODIUM SUCC 125 MG IJ SOLR
125.0000 mg | Freq: Two times a day (BID) | INTRAMUSCULAR | Status: DC
  Administered 2022-07-25: 125 mg via INTRAVENOUS
  Filled 2022-07-25: qty 2

## 2022-07-25 MED ORDER — PROPOFOL 1000 MG/100ML IV EMUL
INTRAVENOUS | Status: AC
  Administered 2022-07-25: 35.4 mg via INTRAVENOUS
  Filled 2022-07-25: qty 100

## 2022-07-25 MED ORDER — METOCLOPRAMIDE HCL 5 MG/ML IJ SOLN: 10.0000 mg | Freq: Once | INTRAMUSCULAR | Status: AC

## 2022-07-25 MED ORDER — INSULIN ASPART 100 UNIT/ML IJ SOLN
0.0000 [IU] | INTRAMUSCULAR | Status: DC
  Administered 2022-07-25: 15 [IU] via SUBCUTANEOUS

## 2022-07-25 MED ORDER — HYDROCORTISONE SOD SUC (PF) 250 MG IJ SOLR
200.0000 mg | Freq: Once | INTRAMUSCULAR | Status: AC
  Administered 2022-07-25: 200 mg via INTRAVENOUS
  Filled 2022-07-25: qty 200

## 2022-07-25 MED ORDER — PHENYLEPHRINE 80 MCG/ML (10ML) SYRINGE FOR IV PUSH (FOR BLOOD PRESSURE SUPPORT)
PREFILLED_SYRINGE | INTRAVENOUS | Status: AC
  Administered 2022-07-25: 60 ug via RESPIRATORY_TRACT
  Filled 2022-07-25: qty 10

## 2022-07-25 MED ORDER — MIDAZOLAM HCL 2 MG/2ML IJ SOLN: 1.0000 mg | Freq: Once | INTRAMUSCULAR | Status: AC

## 2022-07-25 MED ORDER — PROPOFOL 1000 MG/100ML IV EMUL
5.0000 ug/kg/min | INTRAVENOUS | Status: DC
  Administered 2022-07-25: 5 ug/kg/min via INTRAVENOUS

## 2022-07-25 MED ORDER — NOREPINEPHRINE 4 MG/250ML-% IV SOLN
2.0000 ug/min | INTRAVENOUS | Status: DC
  Administered 2022-07-25: 9 ug/min via INTRAVENOUS

## 2022-07-25 MED ORDER — POLYETHYLENE GLYCOL 3350 17 G PO PACK
17.0000 g | PACK | Freq: Every day | ORAL | Status: DC
  Administered 2022-07-26: 17 g
  Filled 2022-07-25: qty 1

## 2022-07-25 MED ORDER — FENTANYL CITRATE PF 50 MCG/ML IJ SOSY
25.0000 ug | PREFILLED_SYRINGE | INTRAMUSCULAR | Status: DC | PRN
  Administered 2022-07-25: 75 ug via INTRAVENOUS
  Administered 2022-07-26 – 2022-07-27 (×3): 50 ug via INTRAVENOUS
  Filled 2022-07-25 (×4): qty 1

## 2022-07-25 MED ORDER — PROPOFOL 1000 MG/100ML IV EMUL
0.0000 ug/kg/min | INTRAVENOUS | Status: DC
  Administered 2022-07-25: 15 ug/kg/min via INTRAVENOUS
  Administered 2022-07-26 (×2): 30 ug/kg/min via INTRAVENOUS
  Administered 2022-07-26: 25 ug/kg/min via INTRAVENOUS
  Administered 2022-07-26: 20 ug/kg/min via INTRAVENOUS
  Administered 2022-07-26: 30 ug/kg/min via INTRAVENOUS
  Administered 2022-07-27: 40 ug/kg/min via INTRAVENOUS
  Administered 2022-07-27: 15 ug/kg/min via INTRAVENOUS
  Administered 2022-07-27 (×2): 30 ug/kg/min via INTRAVENOUS
  Administered 2022-07-27 – 2022-07-28 (×4): 20 ug/kg/min via INTRAVENOUS
  Filled 2022-07-25 (×13): qty 100

## 2022-07-25 MED ORDER — ORAL CARE MOUTH RINSE: 15.0000 mL | OROMUCOSAL | Status: DC | PRN

## 2022-07-25 MED ORDER — ROCURONIUM BROMIDE 50 MG/5ML IV SOLN
100.0000 mg | Freq: Once | INTRAVENOUS | Status: AC
  Administered 2022-07-25: 100 mg via INTRAVENOUS
  Filled 2022-07-25: qty 10

## 2022-07-25 MED ORDER — SUCCINYLCHOLINE CHLORIDE 200 MG/10ML IV SOSY
PREFILLED_SYRINGE | INTRAVENOUS | Status: AC
  Filled 2022-07-25: qty 10

## 2022-07-25 MED ORDER — LIDOCAINE HCL (PF) 2 % IJ SOLN
INTRAMUSCULAR | Status: AC
  Filled 2022-07-25: qty 10

## 2022-07-25 MED ORDER — ESMOLOL HCL-SODIUM CHLORIDE 2000 MG/100ML IV SOLN
25.0000 ug/kg/min | INTRAVENOUS | Status: DC
  Filled 2022-07-25: qty 100

## 2022-07-25 MED ORDER — CHLORHEXIDINE GLUCONATE CLOTH 2 % EX PADS: 6.0000 | MEDICATED_PAD | Freq: Every day | CUTANEOUS | Status: DC

## 2022-07-25 MED ORDER — ORAL CARE MOUTH RINSE
15.0000 mL | OROMUCOSAL | Status: DC
  Administered 2022-07-25 – 2022-07-28 (×34): 15 mL via OROMUCOSAL

## 2022-07-25 MED ORDER — SUCCINYLCHOLINE CHLORIDE 200 MG/10ML IV SOSY: 100.0000 mg | PREFILLED_SYRINGE | Freq: Once | INTRAVENOUS | Status: DC

## 2022-07-25 MED ORDER — ROCURONIUM BROMIDE 50 MG/5ML IV SOLN
100.0000 mg | Freq: Once | INTRAVENOUS | Status: AC
  Administered 2022-07-25: 100 mg via INTRAVENOUS

## 2022-07-25 MED ORDER — RACEPINEPHRINE HCL 2.25 % IN NEBU
0.5000 mL | INHALATION_SOLUTION | Freq: Once | RESPIRATORY_TRACT | Status: AC
  Administered 2022-07-25: 0.5 mL via RESPIRATORY_TRACT
  Filled 2022-07-25: qty 0.5

## 2022-07-25 MED ORDER — FAMOTIDINE IN NACL 20-0.9 MG/50ML-% IV SOLN
20.0000 mg | Freq: Two times a day (BID) | INTRAVENOUS | Status: DC
  Administered 2022-07-25 – 2022-07-27 (×5): 20 mg via INTRAVENOUS
  Filled 2022-07-25 (×6): qty 50

## 2022-07-25 MED ORDER — KETAMINE HCL 50 MG/5ML IJ SOSY
100.0000 mg | PREFILLED_SYRINGE | Freq: Once | INTRAMUSCULAR | Status: AC
  Administered 2022-07-25: 50 mg via INTRAVENOUS
  Filled 2022-07-25: qty 10

## 2022-07-25 MED ORDER — LIDOCAINE HCL (PF) 2 % IJ SOLN: 10.0000 mL | Freq: Once | INTRAMUSCULAR | Status: AC

## 2022-07-25 MED ORDER — LIDOCAINE HCL (PF) 4 % IJ SOLN
5.0000 mL | Freq: Once | INTRAMUSCULAR | Status: DC
  Filled 2022-07-25: qty 5

## 2022-07-25 MED ORDER — DIPHENHYDRAMINE HCL 50 MG/ML IJ SOLN
50.0000 mg | Freq: Three times a day (TID) | INTRAMUSCULAR | Status: DC
  Administered 2022-07-25 – 2022-07-28 (×8): 50 mg via INTRAVENOUS
  Filled 2022-07-25 (×8): qty 1

## 2022-07-25 MED ORDER — SODIUM CHLORIDE 0.9 % IV BOLUS
500.0000 mL | Freq: Once | INTRAVENOUS | Status: AC
  Administered 2022-07-25: 500 mL via INTRAVENOUS

## 2022-07-25 MED ORDER — SODIUM CHLORIDE 0.9 % IV SOLN
250.0000 mL | INTRAVENOUS | Status: DC
  Administered 2022-07-25 – 2022-07-26 (×2): 250 mL via INTRAVENOUS

## 2022-07-25 NOTE — Progress Notes (Signed)
eLink Physician-Brief Progress Note Patient Name: Gregory Alvarado DOB: Jul 24, 1951 MRN: [id number]   Date of Service  07/25/2022  HPI/Events of Note  56M with COPD, chronic home O2 who presented to anaphylaxis in setting of lisinopril. In ED had hypoxemia, and failed intubation and cric. Had cardiac arrest and after ROSC was intubated. On levophed 8. Started on epi PIV access  eICU Interventions  --Continue full vent support --Steroids, benadryl, pepcid --On levophed. Team adding epi now for hypotension. MAP goal >65 --ETT 5.8 cc above carina. Advance ETT 1-2 cm if able. Ground team notified due to critical airway --Repeat CXR --F/u trop --F/u K     Intervention Category Evaluation Type: New Patient Evaluation  [place] Rodman Pickle 07/25/2022, 11:26 PM

## 2022-07-25 NOTE — ED Notes (Signed)
Patient at 93% with BVM, flushed coloring returning to face with Dr. Arnoldo Morale continue to undergo tracheostomy.

## 2022-07-25 NOTE — Progress Notes (Signed)
Anesthesia was called to intubate the patient in respiratory distress. Patient has severe angioedema, large swollen tongue, unable open his mouth, drooling in sitting position, saturations in low 90s on nasal cannula. Prepared for awake, oral/nasal intubations. Glycopyrrolate 0.4 mg, racemic epinephrine and lidocaine 4% nebulizer treatment. Able to introduce glidescope 4 after sedation.  Ketamine 50 mg and propofol 40 mg was given in divided doses, kept the patient  breathing spontaneously, wasn't able to see vocal cords clearly during first attempt. Desaturated and bradycardia for approximately 60 seconds, epinephrine 1 mg iv and chest compressions done, patient saturations improved,  palpable pulse,  Dr. Arnoldo Morale attempted cricothyrotomy and was unsuccessful. Attempted again with glidescope and successfully intubated 6.0 ETT. ETCO2 positive, breath sounds bilateral equal. Chest XR reviewed.

## 2022-07-25 NOTE — H&P (Signed)
NAME:  Gregory Alvarado, MRN:  [id number], DOB:  1951/07/26, LOS: 0 ADMISSION DATE:  07/25/2022, CONSULTATION DATE:  11/11 REFERRING MD:  Roderic Palau, CHIEF COMPLAINT:  angioedema   History of Present Illness:  Patient is encephalopathic and/or intubated. Therefore history has been obtained from chart review.   Gregory Alvarado, is a 71 y.o. 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  Reports of patient taking lisinopril.  He developed worsening hypoxia in the [facility]. Intubation was attempted and failed, emergent tracheostomy was attempted without success.  The patient was intubated.  After intubation patient bradycardia down to 39, pulse was loss, around 1 minute of CPR with ROSC.  PCCM was consulted for transfer to [facility] and admission.  Pertinent  Medical History   COPD, chronic respiratory failure on 3L, systolic and diastolic CHF, CAD with DES (12/21), GIB, DM II, HLD, tobacco use   Significant Hospital Events: Including procedures, antibiotic start and stop dates in addition to other pertinent events   11/11 presented to [facility] ED, failed intubation and failed tracheostomy, 1 minute cardiac arrest with ROSC, intubated, PCCM consult  Interim History / Subjective:  See above  Drips: levophed 8 mcg, Propofol 59mg  Subjective exam unable to be obtained due to patient status  Objective   Blood pressure (!) 89/63, pulse (!) 111, temperature 98.7 F (37.1 C), temperature source Oral, resp. rate (!) 26, height '5\' 9"'$  (1.753 m), weight 131.5 kg, SpO2 100 %.    Vent Mode: PRVC FiO2 (%):  [100 %] 100 % Set Rate:  [18 bmp-26 bmp] 26 bmp Vt Set:  [560 mL-570 mL] 560 mL PEEP:  [8 cmH20] 8  cmH20 Plateau Pressure:  [27 cmH20] 27 cmH20   Intake/Output Summary (Last 24 hours) at 07/25/2022 2307 Last data filed at 07/25/2022 1854 Gross per 24 hour  Intake 500 ml  Output --  Net 500 ml   Filed Weights   07/25/22 1617  Weight: 131.5 kg    Examination: General: In bed, NAD, appears comfortable HEENT: MM pink/moist, anicteric, atraumatic, sever angioedema, protruding tongue Neuro: RASS -1, PERRL 330m GCS 11t, moving all extremities,  CV: S1S2, ST, no m/r/g appreciated PULM:  wheezes in the upper lobes, wheezes in the lower lobes, trachea midline, chest expansion symmetric GI: rounded, bsx4 active, non-tender   Extremities: warm/dry, no pretibial edema, capillary refill less than 3 seconds  Skin:  no rashes or lesions noted   Labs/imaging WBC 11.5 Hematocrit 37 Potassium 5.3 Glucose 215 BUN 26, creatinine 1.24 (baseline 1.08-1.2) Troponin 18>26 ABG 7.21, CO2 80/96/32 Chest x-ray: Question pulmonary edema, no pneumothorax, stable ET tube, no pleural effusion, no obvious infiltrate   Resolved Hospital Problem list    Assessment & Plan:  Angioedema vs anaphylaxis Shock, ?anaphylaxis vs medication related Acute on chronic respiratory failure with hypoxia and hypercapnia COPD Presented with angioedema, large swollen tongue, unable to open mouth, drooling in sitting position, concern secondary to lisinopril. Unclear if histamine or bradykinin mediated angioedema. Could possibly be anaphylaxis- patient with recent episode of tongue swelling two months ago, treated with benadryl with resolution. Documented SPO2 of 59%,ABG 7.21/80/96/32. WBC 11.5. afebrile. Suspect reactive. -Admit to ICU -LTVV strategy with  tidal volumes of 4-8 cc/kg ideal body weight -Goal plateau pressures less than 30 and driving pressures less than 15 -Wean PEEP/FiO2 for SpO2 92-98% -VAP bundle -PAD bundle with Propofol gtt and fentanyl push -Discussed with Dr. Patsey Berthold, patient calm, rass -1,  following commands, holding on paralyzing at this time. Continue low dose prop. If any agitation, will sedate and paralyze -Follow intermittent CXR and ABG PRN -As needed albuterol -continue methylprednisolone '125mg'$  q12h, benadryl '50mg'$  IV Q8H, Pepcid '20mg'$  IV BID  In hospital cardiac arrest Troponin elevation, suspect demand from CPR and arrest CAD with DES (90/24) Systolic and diastolic CHF HLD Patient bradycardia down prior to arrest, suspect secondary to hypoxia from lack of airway with angioedema.  Suspect troponin elevation regarding demand from hypoxia and in hospital cardiac arrest. Following commands. No indication for TTM. -Hold home antihypertensives -Trend troponin -Continuous EKG -Goal MAP 65. Consider epi gtt if hypotensive -Obtain ECHO  DM 2, suspect poorly controlled -Blood Glucose goal 140-180. -SSI  Hyperkalemia K 5.3 -recheck on arrival -temporize if needed    Best Practice (right click and "Reselect all SmartList Selections" daily)   Diet/type: NPO DVT prophylaxis: prophylactic heparin  GI prophylaxis: H2B Lines: N/A Foley:  Yes, and it is still needed Code Status:  full code Last date of multidisciplinary goals of care discussion [pending]  Labs   CBC: Recent Labs  Lab 07/25/22 1605  WBC 11.5*  NEUTROABS 9.7*  HGB 10.4*  HCT 37.0*  MCV 82.4  PLT 097    Basic Metabolic Panel: Recent Labs  Lab 07/25/22 1605  NA 137  K 5.3*  CL 100  CO2 31  GLUCOSE 215*  BUN 26*  CREATININE 1.24  CALCIUM 9.6   GFR: Estimated Creatinine Clearance: 75.5 mL/min (by C-G formula based on SCr of 1.24 mg/dL). Recent Labs  Lab 07/25/22 1605  WBC 11.5*    Liver Function Tests: Recent Labs  Lab 07/25/22 1605  AST 12*  ALT 9  ALKPHOS 91  BILITOT 0.3  PROT 7.4  ALBUMIN 3.6   No results for input(s): "LIPASE", "AMYLASE" in the last 168 hours. No results for input(s): "AMMONIA" in the last 168 hours.  ABG    Component Value Date/Time   PHART  7.21 (L) 07/25/2022 1820   PCO2ART 80 (HH) 07/25/2022 1820   PO2ART 96 07/25/2022 1820   HCO3 32.0 (H) 07/25/2022 1820   O2SAT 98.5 07/25/2022 1820     Coagulation Profile: No results for input(s): "INR", "PROTIME" in the last 168 hours.  Cardiac Enzymes: No results for input(s): "CKTOTAL", "CKMB", "CKMBINDEX", "TROPONINI" in the last 168 hours.  HbA1C: Hemoglobin A1C  Date/Time Value Ref Range Status  11/03/2016 12:00 AM 13.2  Final   Hgb A1c MFr Bld  Date/Time Value Ref Range Status  11/25/2021 06:56 AM 6.4 (H) 4.8 - 5.6 % Final    Comment:    (NOTE)         Prediabetes: 5.7 - 6.4         Diabetes: >6.4         Glycemic control for adults with diabetes: <7.0   08/28/2021 10:52 AM 6.9 (H) 4.8 - 5.6 % Final    Comment:    (NOTE) Pre diabetes:          5.7%-6.4%  Diabetes:              >6.4%  Glycemic control for   <7.0% adults with diabetes     CBG: Recent Labs  Lab 07/25/22  2129 07/25/22 2239  GLUCAP 322* 347*    Review of Systems:   Unable to obtain 'ROS due to patient status  Past Medical History:  He,  has a past medical history of Anemia, Arthritis, COPD (chronic obstructive pulmonary disease) ([place]), Coronary artery disease (09/11/2020), Diabetes mellitus without complication ([place]), Dyslipidemia, Hypertension, Insomnia, Insomnia, Morbid obesity ([place]), and Tobacco use.   Surgical History:   Past Surgical History:  Procedure Laterality Date   BIOPSY  10/28/2019   Procedure: BIOPSY;  Surgeon: Danie Binder, MD;  [facility];  Service: Endoscopy;;  gastric   BIOPSY  04/15/2020   Procedure: BIOPSY;  Surgeon: Daneil Dolin, MD;  [facility];  Service: Endoscopy;;  gastric   CARDIAC CATHETERIZATION  08/2020   3 stents placed   COLON RESECTION  1990s?   DIVERTICULITIS   COLONOSCOPY WITH PROPOFOL N/A 04/15/2020   Rourk: Poor colon prep. two 6 to 8 mm polyps removed from the hepatic flexure, tubular adenomas.  Repeat colonoscopy  later this year.   ENTEROSCOPY N/A 11/26/2021   Procedure: ENTEROSCOPY;  Surgeon: Harvel Quale, MD;  [facility];  Service: Gastroenterology;  Laterality: N/A;   ENTEROSCOPY N/A 03/13/2022   Procedure: ENTEROSCOPY;  Surgeon: [name], DO;  [facility];  Service: Endoscopy;  Laterality: N/A;   ESOPHAGOGASTRODUODENOSCOPY (EGD) WITH PROPOFOL N/A 10/28/2019   Dr. Oneida Alar: Multiple cratered gastric ulcers, gastritis/duodenitis.  Path showed H. pylori.  Patient treated with amoxicillin/Biaxin/PPI twice daily.   ESOPHAGOGASTRODUODENOSCOPY (EGD) WITH PROPOFOL N/A 04/15/2020   Rourk: Patchy gastric erythema status post biopsy to document eradication of H. pylori.  Biopsy showed persistent H. pylori.  Previously noted gastric ulcer is completely healed.   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   ESOPHAGOGASTRODUODENOSCOPY (EGD) WITH PROPOFOL N/A 03/13/2022   Procedure: ESOPHAGOGASTRODUODENOSCOPY (EGD) WITH PROPOFOL;  Surgeon: [name], DO;  [facility];  Service: Endoscopy;  Laterality: N/A;   HERNIA REPAIR     UHR   HOT HEMOSTASIS  11/26/2021   Procedure: HOT HEMOSTASIS (ARGON PLASMA COAGULATION/BICAP);  Surgeon: Montez Morita, Quillian Quince, MD;  [facility];  Service: Gastroenterology;;   Venia Minks DILATION N/A 04/15/2020   Procedure: Keturah Shavers;  Surgeon: Daneil Dolin, MD;  [facility];  Service: Endoscopy;  Laterality: N/A;   POLYPECTOMY  04/15/2020   Procedure: POLYPECTOMY;  Surgeon: Daneil Dolin, MD;  [facility];  Service: Endoscopy;;  colon   ROTATOR CUFF REPAIR Bilateral    TYMPANOPLASTY       Social History:   reports that he has been smoking cigarettes. He has been smoking an average of 2 packs per day. He has never used smokeless tobacco. He reports current alcohol use. He reports  that he does not use drugs.   Family History:  His family history includes Diabetes in his mother; Heart attack in his brother, father, and mother. There is no history of Colon cancer or Stomach cancer.   Allergies Allergies  Allergen Reactions   Caduet [Amlodipine-Atorvastatin] Swelling     Home Medications  Prior to Admission medications   Medication Sig Start Date End Date Taking? Authorizing Provider  acetaminophen (TYLENOL) 325 MG tablet Take 2 tablets (650 mg total) by mouth every 6 (six) hours as needed for mild pain (or Fever >/= 101). 10/29/19   Roxan Hockey, MD  atorvastatin (LIPITOR) 80 MG tablet Take 80 mg by mouth daily. 01/26/22   [provider]  cyanocobalamin (,VITAMIN B-12,) 1000 MCG/ML injection Inject 1,000 mcg into the muscle every 30 (thirty) days. 02/11/22   [provider]  ferrous sulfate 325 (65 FE) MG tablet Take 1 tablet (325 mg total) by mouth 2 (two) times daily with a meal. 03/16/22   Tat, Shanon Brow, MD  furosemide (LASIX) 40 MG tablet Take 1 tablet (40 mg total) by mouth daily. 03/15/22   Orson Eva, MD  gabapentin (NEURONTIN) 300 MG capsule Take 1 capsule by mouth 4 (four) times daily. 12/30/20   [provider]  insulin NPH-regular Human (NOVOLIN 70/30) (70-30) 100 UNIT/ML injection Inject 70 Units into the skin in the morning and at bedtime.    [provider]  Ipratropium-Albuterol (COMBIVENT RESPIMAT) 20-100 MCG/ACT AERS respimat Inhale 1 puff into the lungs every 6 (six) hours as needed for wheezing or shortness of breath. 11/27/21   Manuella Ghazi, Pratik D, DO  levocetirizine (XYZAL) 5 MG tablet Take 5 mg by mouth at bedtime. 03/11/22   [provider]  lisinopril (ZESTRIL) 10 MG tablet Take 1 tablet (10 mg total) by mouth daily. 03/15/22   Orson Eva, MD  metoprolol succinate (TOPROL-XL) 100 MG 24 hr tablet TAKE 1 AND 1/2 TABLETS BY MOUTH ONCE A DAY. 02/13/22   Arnoldo Lenis, MD  nitroGLYCERIN (NITROSTAT) 0.4 MG SL tablet  DISSOLVE 1 TABLET SUBLINGUALLY AS NEEDED FOR CHEST PAIN, MAY REPEAT EVERY 5 MINUTES. AFTER 3 CALL 911. 07/10/22   Arnoldo Lenis, MD  pantoprazole (PROTONIX) 40 MG tablet Take 40 mg by mouth daily. 03/21/22   [provider]  spironolactone (ALDACTONE) 25 MG tablet TAKE 1 TABLET BY MOUTH ONCE A DAY. 05/22/22   Arnoldo Lenis, MD  traMADol (ULTRAM) 50 MG tablet Take 50-100 mg by mouth in the morning and at bedtime. 09/20/19   [provider]  zolpidem (AMBIEN) 5 MG tablet Take 5 mg by mouth at bedtime as needed for sleep. 09/26/20   [provider]     Critical care time: 45 minutes    The patient is critically ill with multiple organ systems failure and requires high complexity decision making for assessment and support, frequent evaluation and titration of therapies, application of advanced monitoring technologies and extensive interpretation of multiple databases.    Critical Care Time devoted to patient care services described in this note is 45 minutes. This time reflects time of care of this [place] NP. This critical care time does not reflect procedure time but could involve care discussion time with the PCCM attending.  Redmond School., MSN, APRN, AGACNP-BC [facility]  07/25/2022 , 11:07 PM  Please see [email or web address] for pager details  If no response, please call [phone] After hours, please call Elink at [phone]

## 2022-07-25 NOTE — ED Notes (Signed)
Patient attempting to wake up and per Dr. Norma Fredrickson, push '10mg'$  Propofol from Drip medication.

## 2022-07-25 NOTE — ED Notes (Signed)
Patient moving bilateral legs and head to the right. Per Dr. Charna Elizabeth patient needs rocuronium to prevent accidental extubation due to patient being a difficult airway.

## 2022-07-25 NOTE — ED Notes (Signed)
Patient attempting to wake up and move bilateral arms.

## 2022-07-25 NOTE — Progress Notes (Signed)
Accepted to [facility]. Please keep heavily sedated RASS -5 and paralytics PRN due to critical airway.  Erskine Emery MD PCCM

## 2022-07-25 NOTE — Progress Notes (Signed)
Pt received from [facility].

## 2022-07-25 NOTE — ED Notes (Addendum)
Patient pulse oxygen dropped to 79% and patient is pale with blue coloring on the face. Dr Charna Elizabeth anaestheiology and Dr. Arnoldo Morale surgery at bedside. Dr. Arnoldo Morale to attempt tracheostomy at bedside.

## 2022-07-25 NOTE — Anesthesia Procedure Notes (Signed)
Procedure Name: Intubation Date/Time: 07/25/2022 5:38 PM  Performed by: Denese Killings, MDPre-anesthesia Checklist: Patient identified, Emergency Drugs available, Suction available and Patient being monitored Patient Re-evaluated:Patient Re-evaluated prior to induction Oxygen Delivery Method: Ambu bag and Non-rebreather mask Preoxygenation: Pre-oxygenation with 100% oxygen Induction Type: IV induction Ventilation: Mask ventilation with difficulty and Two handed mask ventilation required Laryngoscope Size: Glidescope and 4 Grade View: Grade III Tube type: Oral Tube size: 6.0 mm Number of attempts: 2 Airway Equipment and Method: Stylet Placement Confirmation: ETT inserted through vocal cords under direct vision, positive ETCO2 and breath sounds checked- equal and bilateral Secured at: 24 cm Tube secured with: Tape Dental Injury: Teeth and Oropharynx as per pre-operative assessment  Difficulty Due To: Difficulty was anticipated, Difficult Airway-  due to edematous airway, Difficult Airway- due to limited oral opening and Difficult Airway- due to large tongue

## 2022-07-25 NOTE — ED Provider Notes (Signed)
[facility] EMERGENCY DEPARTMENT Provider Note   CSN: [id number] Arrival date & time: 07/25/22  1548     History {Add pertinent medical, surgical, social history, OB history to HPI:1} Chief Complaint  Patient presents with   Angioedema    Gregory Alvarado is a 71 y.o. male.  Patient has a history of hypertension coronary artery disease.  Patient presents with his tongue swollen significantly.  He has been taking lisinopril   Shortness of Breath      Home Medications Prior to Admission medications   Medication Sig Start Date End Date Taking? Authorizing Provider  acetaminophen (TYLENOL) 325 MG tablet Take 2 tablets (650 mg total) by mouth every 6 (six) hours as needed for mild pain (or Fever >/= 101). 10/29/19   Roxan Hockey, MD  atorvastatin (LIPITOR) 80 MG tablet Take 80 mg by mouth daily. 01/26/22   [provider]  cyanocobalamin (,VITAMIN B-12,) 1000 MCG/ML injection Inject 1,000 mcg into the muscle every 30 (thirty) days. 02/11/22   [provider]  ferrous sulfate 325 (65 FE) MG tablet Take 1 tablet (325 mg total) by mouth 2 (two) times daily with a meal. 03/16/22   Tat, Shanon Brow, MD  furosemide (LASIX) 40 MG tablet Take 1 tablet (40 mg total) by mouth daily. 03/15/22   Orson Eva, MD  gabapentin (NEURONTIN) 300 MG capsule Take 1 capsule by mouth 4 (four) times daily. 12/30/20   [provider]  insulin NPH-regular Human (NOVOLIN 70/30) (70-30) 100 UNIT/ML injection Inject 70 Units into the skin in the morning and at bedtime.    [provider]  Ipratropium-Albuterol (COMBIVENT RESPIMAT) 20-100 MCG/ACT AERS respimat Inhale 1 puff into the lungs every 6 (six) hours as needed for wheezing or shortness of breath. 11/27/21   Manuella Ghazi, Pratik D, DO  levocetirizine (XYZAL) 5 MG tablet Take 5 mg by mouth at bedtime. 03/11/22   [provider]  lisinopril (ZESTRIL) 10 MG tablet Take 1 tablet (10 mg total) by mouth daily. 03/15/22   Orson Eva, MD   metoprolol succinate (TOPROL-XL) 100 MG 24 hr tablet TAKE 1 AND 1/2 TABLETS BY MOUTH ONCE A DAY. 02/13/22   Arnoldo Lenis, MD  nitroGLYCERIN (NITROSTAT) 0.4 MG SL tablet DISSOLVE 1 TABLET SUBLINGUALLY AS NEEDED FOR CHEST PAIN, MAY REPEAT EVERY 5 MINUTES. AFTER 3 CALL 911. 07/10/22   Arnoldo Lenis, MD  pantoprazole (PROTONIX) 40 MG tablet Take 40 mg by mouth daily. 03/21/22   [provider]  spironolactone (ALDACTONE) 25 MG tablet TAKE 1 TABLET BY MOUTH ONCE A DAY. 05/22/22   Arnoldo Lenis, MD  traMADol (ULTRAM) 50 MG tablet Take 50-100 mg by mouth in the morning and at bedtime. 09/20/19   [provider]  zolpidem (AMBIEN) 5 MG tablet Take 5 mg by mouth at bedtime as needed for sleep. 09/26/20   [provider]      Allergies    Caduet [amlodipine-atorvastatin]    Review of Systems   Review of Systems  Respiratory:  Positive for shortness of breath.     Physical Exam Updated Vital Signs BP (!) 109/51   Pulse (!) 117   Temp 98.8 F (37.1 C) (Axillary)   Resp (!) 26   Ht '5\' 9"'$  (1.753 m)   Wt 131.5 kg   SpO2 94%   BMI 42.83 kg/m  Physical Exam  ED Results / Procedures / Treatments   Labs (all labs ordered are listed, but only abnormal results are displayed) Labs Reviewed  CBC WITH DIFFERENTIAL/PLATELET - Abnormal; Notable for the following components:      Result Value   WBC 11.5 (*)    Hemoglobin 10.4 (*)    HCT 37.0 (*)    MCH 23.2 (*)    MCHC 28.1 (*)    RDW 17.1 (*)    Neutro Abs 9.7 (*)    All other components within normal limits  COMPREHENSIVE METABOLIC PANEL - Abnormal; Notable for the following components:   Potassium 5.3 (*)    Glucose, Bld 215 (*)    BUN 26 (*)    AST 12 (*)    All other components within normal limits  BLOOD GAS, ARTERIAL - Abnormal; Notable for the following components:   pH, Arterial 7.21 (*)    pCO2 arterial 80 (*)    Bicarbonate 32.0 (*)    Acid-Base Excess 2.4 (*)    All other components within  normal limits  TROPONIN I (HIGH SENSITIVITY) - Abnormal; Notable for the following components:   Troponin I (High Sensitivity) 18 (*)    All other components within normal limits  BRAIN NATRIURETIC PEPTIDE  TROPONIN I (HIGH SENSITIVITY)    EKG None  Radiology DG Chest Portable 1 View  Result Date: 07/25/2022 CLINICAL DATA:  Intubation EXAM: PORTABLE CHEST 1 VIEW COMPARISON:  03/12/2022 FINDINGS: Cardiomegaly. Diffuse bilateral interstitial pulmonary opacity. Endotracheal tube positioned with tip below the thoracic inlet. Osseous structures unremarkable. IMPRESSION: 1. Cardiomegaly with diffuse bilateral interstitial pulmonary opacity, likely edema. 2. Endotracheal tube positioned with tip below the thoracic inlet. Electronically Signed   By: Delanna Ahmadi M.D.   On: 07/25/2022 17:58    Procedures Procedures  {Document cardiac monitor, telemetry assessment procedure when appropriate:1}  Medications Ordered in ED Medications  lidocaine (PF) (XYLOCAINE) 4 % (PF) injection 5 mL (5 mLs Intradermal Not Given 07/25/22 1714)  esmolol (BREVIBLOC) 2000 mg / 100 mL (20 mg/mL) infusion ( Intravenous Not Given 07/25/22 1811)  lidocaine HCl (PF) (XYLOCAINE) 2 % injection (  Canceled Entry 07/25/22 1752)  succinylcholine (ANECTINE) syringe 100 mg ( Intravenous Not Given 07/25/22 1811)  propofol (DIPRIVAN) 1000 MG/100ML infusion (10 mcg/kg/min  131.5 kg Intravenous Transfusing/Transfer 07/25/22 1952)  EPINEPHrine (ADRENALIN) 1 MG/10ML injection (1 mg Intravenous Given 07/25/22 1741)  rocuronium (ZEMURON) injection 100 mg (has no administration in time range)  Racepinephrine HCl 2.25 % nebulizer solution 0.5 mL (0.5 mLs Nebulization Given 07/25/22 1627)  hydrocortisone sodium succinate (SOLU-CORTEF) injection 200 mg (200 mg Intravenous Given 07/25/22 1645)  glycopyrrolate (ROBINUL) injection 0.6 mg (0.2 mg Intravenous Given 07/25/22 1637)  ketamine 50 mg in normal saline 5 mL (10 mg/mL) syringe (50  mg Intravenous Given 07/25/22 1715)  labetalol (NORMODYNE) injection 20 mg (5 mg Intravenous Given 07/25/22 1638)  lidocaine HCl (PF) (XYLOCAINE) 2 % injection 10 mL (1 mL Other Given 07/25/22 1713)  labetalol (NORMODYNE) injection 10 mg (5 mg Intravenous Given 07/25/22 1658)  metoCLOPramide (REGLAN) injection 10 mg (10 mg Intravenous Given 07/25/22 1657)  propofol (DIPRIVAN) 10 mg/mL bolus/IV push 35.4 mg (35.4 mg Intravenous Given 07/25/22 1724)  PHENYLephrine 80 mcg/ml in normal saline Adult IV Push Syringe (For Blood Pressure Support) (60 mcg Inhalation Given 07/25/22 1658)  rocuronium (ZEMURON) injection 100 mg (100 mg Intravenous Given 07/25/22 1752)  sodium chloride 0.9 % bolus 500 mL (0 mLs Intravenous Stopped 07/25/22 1854)  rocuronium (ZEMURON) injection 100 mg ( Intravenous Not Given 07/25/22 1853)  ipratropium (ATROVENT) 0.02 % nebulizer solution (0.5 mg  Given 07/25/22 1924)  albuterol (PROVENTIL) (  2.5 MG/3ML) 0.083% nebulizer solution (5 mg  Given 07/25/22 1924)    ED Course/ Medical Decision Making/ A&P  CRITICAL CARE Performed by: Milton Ferguson Total critical care time: 80 minutes Critical care time was exclusive of separately billable procedures and treating other patients. Critical care was necessary to treat or prevent imminent or life-threatening deterioration. Critical care was time spent personally by me on the following activities: development of treatment plan with patient and/or surrogate as well as nursing, discussions with consultants, evaluation of patient's response to treatment, examination of patient, obtaining history from patient or surrogate, ordering and performing treatments and interventions, ordering and review of laboratory studies, ordering and review of radiographic studies, pulse oximetry and re-evaluation of patient's condition.    Patient with significant angioedema in his oropharynx especially in his tongue.  I called general surgery and anesthesia  to come see the patient.  Anesthesia did intubate the patient but it was extremely difficult.  At 1 point when he could not get the patient intubated Dr. Arnoldo Morale was attempting a needle cricothyroidotomy.  He was unsuccessful at that.  The anesthesiologist then attempted again and was able to get the patient intubated.  The patient did have about a 1 minute episode where he bradycardia down and did not have a pulse.  He was given epinephrine and some chest compressions but he recovered.  After intubation the chest x-ray showed the tube in the right place and the patient was in mild congestive heart failure.  I contacted critical care and they accepted the patient in transfer to [place] Making Amount and/or Complexity of Data Reviewed Labs: ordered. Radiology: ordered. ECG/medicine tests: ordered.  Risk OTC drugs. Prescription drug management. Decision regarding hospitalization.  Patient with significant angioedema that needed intubation.  He is admitted to critical care over [facility] {Document critical care time when appropriate:1} {Document review of labs and clinical decision tools ie heart score, Chads2Vasc2 etc:1}  {Document your independent review of radiology images, and any outside records:1} {Document your discussion with family members, caretakers, and with consultants:1} {Document social determinants of health affecting pt's care:1} {Document your decision making why or why not admission, treatments were needed:1} Final Clinical Impression(s) / ED Diagnoses Final diagnoses:  Respiratory failure ([place])  Acute respiratory failure with hypoxia ([place])    Rx / DC Orders ED Discharge Orders     None

## 2022-07-25 NOTE — ED Notes (Signed)
[facility] at bedside loading pt up for transport

## 2022-07-25 NOTE — ED Notes (Signed)
Patient intubated at this time with positive color change and breath sounds. 24 at the lip 6 size ETT

## 2022-07-25 NOTE — Code Documentation (Addendum)
No blood pressure obtained at this time. Patient's HR went from 1110 to 59 to 47 brady; femoral pulse felt and no pulse noted. Chest compression started immediately at 1739

## 2022-07-25 NOTE — Consult Note (Signed)
Called to bedside to be available for possible emergency tracheostomy due to the need for intubation for an enlarged tongue with impending respiratory obstruction.  Anesthesia attempting intubation.  During the intubation, the patient did drop his saturations.  I initially made an incision in order to perform a cricothyroidotomy.  It was difficult to access the cricothyroid membrane using the needle due to significant patient movement.  This was aborted and the patient ultimately was intubated.  Soon after intubation, he did bradycardia down to 39.  I performed chest compressions for approximately 30 to 45 seconds.  He did regain a pulse.  Given the ongoing emergent situation, the incision in his neck was closed with staples after applying Betadine.  Patient is to be transferred to [facility] for further management and treatment.

## 2022-07-25 NOTE — ED Notes (Addendum)
Due to significant tongue swelling with throat swelling; unable to insert ng tube at this time

## 2022-07-25 NOTE — ED Notes (Signed)
Per Dr. Charna Elizabeth, patient saturation is 87% and patient can sustain pulse oxygenation at this rate while ventilated

## 2022-07-25 NOTE — ED Triage Notes (Signed)
Pt BIB [facility] with reports of tongue swelling that started 3 hours ago. Pt received 0.'3mg'$  epi en route.

## 2022-07-25 NOTE — ED Notes (Signed)
Attempted intubation at this time without success;

## 2022-07-25 NOTE — Anesthesia Preprocedure Evaluation (Signed)
Anesthesia Evaluation  Patient identified by MRN, date of birth, ID band Patient awake  General Assessment Comment:Patient came with severe angioedema, large swollen tongue, drooling, unable to talk.  Reviewed: Allergy & Precautions, H&P , NPO status , Patient's Chart, lab work & pertinent test resultsPreop documentation limited or incomplete due to emergent nature of procedure.  Airway Mallampati: Unable to assess  TM Distance: >3 FB Neck ROM: Limited  Mouth opening: Limited Mouth Opening  Dental  (+) Edentulous Upper, Edentulous Lower   Pulmonary COPD, Current Smoker and Patient abstained from smoking.    + decreased breath sounds      Cardiovascular hypertension, Pt. on medications + angina  + CAD, + Cardiac Stents and +CHF  Normal cardiovascular exam Rhythm:Regular Rate:Normal     Neuro/Psych negative neurological ROS  negative psych ROS   GI/Hepatic Neg liver ROS, PUD,GERD  ,,  Endo/Other  diabetes, Well Controlled, Type 2, Oral Hypoglycemic Agents    Renal/GU Renal InsufficiencyRenal disease  negative genitourinary   Musculoskeletal negative musculoskeletal ROS (+)    Abdominal   Peds negative pediatric ROS (+)  Hematology negative hematology ROS (+)   Anesthesia Other Findings   Reproductive/Obstetrics negative OB ROS                             Anesthesia Physical Anesthesia Plan  ASA: 5 and emergent  Anesthesia Plan: MAC   Post-op Pain Management: Minimal or no pain anticipated   Induction: Intravenous  PONV Risk Score and Plan: Metaclopromide  Airway Management Planned: Oral ETT, Video Laryngoscope Planned, Awake Intubation Planned and Fiberoptic Intubation Planned  Additional Equipment:   Intra-op Plan:   Post-operative Plan: Post-operative intubation/ventilation  Informed Consent: I have reviewed the patients History and Physical, chart, labs and discussed the  procedure including the risks, benefits and alternatives for the proposed anesthesia with the patient or authorized representative who has indicated his/her understanding and acceptance.     Only emergency history available  Plan Discussed with: Surgeon  Anesthesia Plan Comments: (Patient came with severe angioedema, large swollen tongue, drooling, unable to talk.)       Anesthesia Quick Evaluation

## 2022-07-25 NOTE — ED Notes (Signed)
Updated Dr Roderic Palau on pt neuro status and no response to pain- Zammit instructed to go ahead and give ROC per Critical care Dr request to ensure pt stays sedated during transport, as it is imperative that the ET tube stays in place. [facility] made aware of this.

## 2022-07-25 NOTE — ED Notes (Signed)
Patient intubated for throat swelling , tongue. Took BP rx lisonpril ?? Increased rate to 26 from 18 , ( after blood gas results)  7.21 , 80 , 96 . Given albuterol 5.0 mg / atrovent 0.'5mg'$  for wheezes. Patient takes combivent at home q6 Prn. Peak pressure 40 , VT 560 at 8cc, -- Et tube 6 at 24  tongue swollen. Saturation 91 on 8 peep.

## 2022-07-26 ENCOUNTER — Inpatient Hospital Stay (HOSPITAL_COMMUNITY): Payer: Medicare Other

## 2022-07-26 DIAGNOSIS — J9601 Acute respiratory failure with hypoxia: Secondary | ICD-10-CM

## 2022-07-26 DIAGNOSIS — J9602 Acute respiratory failure with hypercapnia: Secondary | ICD-10-CM

## 2022-07-26 DIAGNOSIS — R579 Shock, unspecified: Secondary | ICD-10-CM | POA: Diagnosis not present

## 2022-07-26 LAB — POCT I-STAT 7, (LYTES, BLD GAS, ICA,H+H)
Acid-base deficit: 2 mmol/L (ref 0.0–2.0)
Bicarbonate: 25.9 mmol/L (ref 20.0–28.0)
Calcium, Ion: 1.26 mmol/L (ref 1.15–1.40)
HCT: 36 % — ABNORMAL LOW (ref 39.0–52.0)
Hemoglobin: 12.2 g/dL — ABNORMAL LOW (ref 13.0–17.0)
O2 Saturation: 99 %
Patient temperature: 98.7
Potassium: 5.3 mmol/L — ABNORMAL HIGH (ref 3.5–5.1)
Sodium: 133 mmol/L — ABNORMAL LOW (ref 135–145)
TCO2: 28 mmol/L (ref 22–32)
pCO2 arterial: 57.7 mmHg — ABNORMAL HIGH (ref 32–48)
pH, Arterial: 7.261 — ABNORMAL LOW (ref 7.35–7.45)
pO2, Arterial: 188 mmHg — ABNORMAL HIGH (ref 83–108)

## 2022-07-26 LAB — COMPREHENSIVE METABOLIC PANEL
ALT: 12 U/L (ref 0–44)
AST: 22 U/L (ref 15–41)
Albumin: 3.1 g/dL — ABNORMAL LOW (ref 3.5–5.0)
Alkaline Phosphatase: 83 U/L (ref 38–126)
Anion gap: 15 (ref 5–15)
BUN: 33 mg/dL — ABNORMAL HIGH (ref 8–23)
CO2: 21 mmol/L — ABNORMAL LOW (ref 22–32)
Calcium: 9 mg/dL (ref 8.9–10.3)
Chloride: 98 mmol/L (ref 98–111)
Creatinine, Ser: 1.88 mg/dL — ABNORMAL HIGH (ref 0.61–1.24)
GFR, Estimated: 38 mL/min — ABNORMAL LOW (ref >60)
Glucose, Bld: 509 mg/dL (ref 70–99)
Potassium: 4.7 mmol/L (ref 3.5–5.1)
Sodium: 134 mmol/L — ABNORMAL LOW (ref 135–145)
Total Bilirubin: 0.2 mg/dL — ABNORMAL LOW (ref 0.3–1.2)
Total Protein: 6.5 g/dL (ref 6.5–8.1)

## 2022-07-26 LAB — GLUCOSE, CAPILLARY
Glucose-Capillary: 457 mg/dL — ABNORMAL HIGH (ref 70–99)
Glucose-Capillary: 512 mg/dL (ref 70–99)
Glucose-Capillary: 479 mg/dL — ABNORMAL HIGH (ref 70–99)
Glucose-Capillary: 503 mg/dL (ref 70–99)
Glucose-Capillary: 452 mg/dL — ABNORMAL HIGH (ref 70–99)
Glucose-Capillary: 447 mg/dL — ABNORMAL HIGH (ref 70–99)
Glucose-Capillary: 402 mg/dL — ABNORMAL HIGH (ref 70–99)
Glucose-Capillary: 421 mg/dL — ABNORMAL HIGH (ref 70–99)
Glucose-Capillary: 357 mg/dL — ABNORMAL HIGH (ref 70–99)
Glucose-Capillary: 280 mg/dL — ABNORMAL HIGH (ref 70–99)
Glucose-Capillary: 225 mg/dL — ABNORMAL HIGH (ref 70–99)
Glucose-Capillary: 213 mg/dL — ABNORMAL HIGH (ref 70–99)
Glucose-Capillary: 169 mg/dL — ABNORMAL HIGH (ref 70–99)
Glucose-Capillary: 157 mg/dL — ABNORMAL HIGH (ref 70–99)
Glucose-Capillary: 150 mg/dL — ABNORMAL HIGH (ref 70–99)
Glucose-Capillary: 138 mg/dL — ABNORMAL HIGH (ref 70–99)
Glucose-Capillary: 155 mg/dL — ABNORMAL HIGH (ref 70–99)
Glucose-Capillary: 154 mg/dL — ABNORMAL HIGH (ref 70–99)

## 2022-07-26 LAB — PHOSPHORUS: Phosphorus: 1.7 mg/dL — ABNORMAL LOW (ref 2.5–4.6)

## 2022-07-26 LAB — ECHOCARDIOGRAM COMPLETE
AR max vel: 2.97 cm2
AV Area VTI: 2.59 cm2
AV Area mean vel: 2.69 cm2
AV Mean grad: 4 mmHg
AV Peak grad: 6.9 mmHg
Ao pk vel: 1.31 m/s
Height: 69 in
S' Lateral: 3.2 cm
Weight: 4642.01 oz

## 2022-07-26 LAB — BASIC METABOLIC PANEL
Anion gap: 7 (ref 5–15)
BUN: 38 mg/dL — ABNORMAL HIGH (ref 8–23)
CO2: 27 mmol/L (ref 22–32)
Calcium: 9.1 mg/dL (ref 8.9–10.3)
Chloride: 101 mmol/L (ref 98–111)
Creatinine, Ser: 1.42 mg/dL — ABNORMAL HIGH (ref 0.61–1.24)
GFR, Estimated: 53 mL/min — ABNORMAL LOW (ref >60)
Glucose, Bld: 156 mg/dL — ABNORMAL HIGH (ref 70–99)
Potassium: 4.6 mmol/L (ref 3.5–5.1)
Sodium: 135 mmol/L (ref 135–145)

## 2022-07-26 LAB — CBC
HCT: 34.2 % — ABNORMAL LOW (ref 39.0–52.0)
Hemoglobin: 10 g/dL — ABNORMAL LOW (ref 13.0–17.0)
MCH: 23.4 pg — ABNORMAL LOW (ref 26.0–34.0)
MCHC: 29.2 g/dL — ABNORMAL LOW (ref 30.0–36.0)
MCV: 79.9 fL — ABNORMAL LOW (ref 80.0–100.0)
Platelets: 294 10*3/uL (ref 150–400)
RBC: 4.28 MIL/uL (ref 4.22–5.81)
RDW: 17.1 % — ABNORMAL HIGH (ref 11.5–15.5)
WBC: 36.6 10*3/uL — ABNORMAL HIGH (ref 4.0–10.5)
nRBC: 0 % (ref 0.0–0.2)

## 2022-07-26 LAB — MAGNESIUM
Magnesium: 1.8 mg/dL (ref 1.7–2.4)
Magnesium: 2.2 mg/dL (ref 1.7–2.4)

## 2022-07-26 LAB — TRIGLYCERIDES: Triglycerides: 219 mg/dL — ABNORMAL HIGH (ref <150)

## 2022-07-26 LAB — TROPONIN I (HIGH SENSITIVITY): Troponin I (High Sensitivity): 340 ng/L (ref <18)

## 2022-07-26 LAB — HEMOGLOBIN A1C
Hgb A1c MFr Bld: 7.7 % — ABNORMAL HIGH (ref 4.8–5.6)
Mean Plasma Glucose: 174.29 mg/dL

## 2022-07-26 MED ORDER — INSULIN REGULAR(HUMAN) IN NACL 100-0.9 UT/100ML-% IV SOLN
INTRAVENOUS | Status: DC
  Administered 2022-07-26: 10 [IU]/h via INTRAVENOUS
  Administered 2022-07-26: 17 [IU]/h via INTRAVENOUS
  Administered 2022-07-26: 28 [IU]/h via INTRAVENOUS
  Filled 2022-07-26 (×3): qty 100

## 2022-07-26 MED ORDER — DEXTROSE 50 % IV SOLN: 0.0000 mL | INTRAVENOUS | Status: DC | PRN

## 2022-07-26 MED ORDER — PERFLUTREN LIPID MICROSPHERE
1.0000 mL | INTRAVENOUS | Status: AC | PRN
  Administered 2022-07-26: 2 mL via INTRAVENOUS

## 2022-07-26 MED ORDER — LACTATED RINGERS IV SOLN: INTRAVENOUS | Status: DC

## 2022-07-26 MED ORDER — VITAL HIGH PROTEIN PO LIQD
1000.0000 mL | ORAL | Status: DC
  Administered 2022-07-26 – 2022-07-27 (×2): 1000 mL

## 2022-07-26 MED ORDER — INSULIN ASPART 100 UNIT/ML IJ SOLN
2.0000 [IU] | INTRAMUSCULAR | Status: DC
  Administered 2022-07-26 – 2022-07-27 (×3): 4 [IU] via SUBCUTANEOUS
  Administered 2022-07-27: 2 [IU] via SUBCUTANEOUS
  Administered 2022-07-27 (×2): 4 [IU] via SUBCUTANEOUS
  Administered 2022-07-27: 2 [IU] via SUBCUTANEOUS
  Administered 2022-07-28 (×2): 4 [IU] via SUBCUTANEOUS

## 2022-07-26 MED ORDER — SODIUM PHOSPHATES 45 MMOLE/15ML IV SOLN
15.0000 mmol | Freq: Once | INTRAVENOUS | Status: AC
  Administered 2022-07-26: 15 mmol via INTRAVENOUS
  Filled 2022-07-26: qty 5

## 2022-07-26 MED ORDER — SODIUM CHLORIDE 0.9 % IV SOLN
2.0000 g | INTRAVENOUS | Status: AC
  Administered 2022-07-26 – 2022-07-28 (×3): 2 g via INTRAVENOUS
  Filled 2022-07-26 (×3): qty 20

## 2022-07-26 MED ORDER — DEXTROSE IN LACTATED RINGERS 5 % IV SOLN: INTRAVENOUS | Status: DC

## 2022-07-26 MED ORDER — CHLORHEXIDINE GLUCONATE CLOTH 2 % EX PADS
6.0000 | MEDICATED_PAD | Freq: Every day | CUTANEOUS | Status: DC
  Administered 2022-07-25 – 2022-07-30 (×5): 6 via TOPICAL

## 2022-07-26 MED ORDER — MAGNESIUM SULFATE 2 GM/50ML IV SOLN
2.0000 g | Freq: Once | INTRAVENOUS | Status: AC
  Administered 2022-07-26: 2 g via INTRAVENOUS
  Filled 2022-07-26: qty 50

## 2022-07-26 MED ORDER — HEPARIN SODIUM (PORCINE) 5000 UNIT/ML IJ SOLN
5000.0000 [IU] | Freq: Three times a day (TID) | INTRAMUSCULAR | Status: DC
  Administered 2022-07-26 – 2022-07-31 (×16): 5000 [IU] via SUBCUTANEOUS
  Filled 2022-07-26 (×16): qty 1

## 2022-07-26 MED ORDER — NOREPINEPHRINE 4 MG/250ML-% IV SOLN
0.0000 ug/min | INTRAVENOUS | Status: DC
  Administered 2022-07-26: 8 ug/min via INTRAVENOUS
  Administered 2022-07-26: 10 ug/min via INTRAVENOUS
  Filled 2022-07-26 (×2): qty 250

## 2022-07-26 NOTE — Plan of Care (Signed)
  Problem: Education: Goal: Knowledge of General Education information will improve Description: Including pain rating scale, medication(s)/side effects and non-pharmacologic comfort measures Outcome: Progressing   Problem: Health Behavior/Discharge Planning: Goal: Ability to manage health-related needs will improve Outcome: Progressing   Problem: Clinical Measurements: Goal: Ability to maintain clinical measurements within normal limits will improve Outcome: Progressing Goal: Will remain free from infection Outcome: Progressing Goal: Diagnostic test results will improve Outcome: Progressing Goal: Respiratory complications will improve Outcome: Progressing Goal: Cardiovascular complication will be avoided Outcome: Progressing   Problem: Activity: Goal: Risk for activity intolerance will decrease Outcome: Progressing   Problem: Nutrition: Goal: Adequate nutrition will be maintained Outcome: Progressing   Problem: Coping: Goal: Level of anxiety will decrease Outcome: Progressing   Problem: Elimination: Goal: Will not experience complications related to bowel motility Outcome: Progressing Goal: Will not experience complications related to urinary retention Outcome: Progressing   Problem: Pain Managment: Goal: General experience of comfort will improve Outcome: Progressing   Problem: Safety: Goal: Ability to remain free from injury will improve Outcome: Progressing   Problem: Skin Integrity: Goal: Risk for impaired skin integrity will decrease Outcome: Progressing   Problem: Education: Goal: Ability to describe self-care measures that may prevent or decrease complications (Diabetes Survival Skills Education) will improve Outcome: Progressing Goal: Individualized Educational Video(s) Outcome: Progressing   Problem: Coping: Goal: Ability to adjust to condition or change in health will improve Outcome: Progressing   Problem: Fluid Volume: Goal: Ability to  maintain a balanced intake and output will improve Outcome: Progressing   Problem: Health Behavior/Discharge Planning: Goal: Ability to identify and utilize available resources and services will improve Outcome: Progressing Goal: Ability to manage health-related needs will improve Outcome: Progressing   Problem: Metabolic: Goal: Ability to maintain appropriate glucose levels will improve Outcome: Progressing   Problem: Nutritional: Goal: Maintenance of adequate nutrition will improve Outcome: Progressing Goal: Progress toward achieving an optimal weight will improve Outcome: Progressing   Problem: Skin Integrity: Goal: Risk for impaired skin integrity will decrease Outcome: Progressing   Problem: Tissue Perfusion: Goal: Adequacy of tissue perfusion will improve Outcome: Progressing   Problem: Safety: Goal: Non-violent Restraint(s) Outcome: Progressing   Problem: Activity: Goal: Ability to tolerate increased activity will improve Outcome: Progressing   Problem: Respiratory: Goal: Ability to maintain a clear airway and adequate ventilation will improve Outcome: Progressing   Problem: Role Relationship: Goal: Method of communication will improve Outcome: Progressing   Problem: Education: Goal: Ability to describe self-care measures that may prevent or decrease complications (Diabetes Survival Skills Education) will improve Outcome: Progressing Goal: Individualized Educational Video(s) Outcome: Progressing   Problem: Cardiac: Goal: Ability to maintain an adequate cardiac output will improve Outcome: Progressing   Problem: Health Behavior/Discharge Planning: Goal: Ability to identify and utilize available resources and services will improve Outcome: Progressing Goal: Ability to manage health-related needs will improve Outcome: Progressing   Problem: Fluid Volume: Goal: Ability to achieve a balanced intake and output will improve Outcome: Progressing    Problem: Metabolic: Goal: Ability to maintain appropriate glucose levels will improve Outcome: Progressing   Problem: Nutritional: Goal: Maintenance of adequate nutrition will improve Outcome: Progressing Goal: Maintenance of adequate weight for body size and type will improve Outcome: Progressing   Problem: Respiratory: Goal: Will regain and/or maintain adequate ventilation Outcome: Progressing   Problem: Urinary Elimination: Goal: Ability to achieve and maintain adequate renal perfusion and functioning will improve Outcome: Progressing

## 2022-07-26 NOTE — Progress Notes (Signed)
CBG 457; notified Elink. advised From [place]: "Ok to give max coverage 20 units & wait for next reading . Related to steroids"

## 2022-07-26 NOTE — Progress Notes (Signed)
  Echocardiogram 2D Echocardiogram has been performed.  Gregory Alvarado 07/26/2022, 3:02 PM

## 2022-07-26 NOTE — Progress Notes (Signed)
[facility] Physician-Brief Progress Note Patient Name: Gregory Alvarado DOB: [birth year] MRN: [id number]   Date of Service  07/26/2022  HPI/Events of Note  RN had contacted [place] earlier to check if endotool can be turned off. Patient had glucose levels of 130s. I checked a BMP mag and phos which are now resulted. Patient was started on IV insulin for hyperglycemia, not DKA. Was on steroids earlier, on pressors earlier - not anymore. Current glucose in 130s while on D5 fluids.   eICU Interventions  Stop IV insulin Stop dextrose containing fluids Planned for another glucose check at 10 pm Will start SSI from midnight with Q4 checks , has been started on just 20 cc/hour tube feeds so am not adding long acting insulin for now . Bicarb was 27 Sodium phos repletion also ordered Restraints renewed for this intubated patient  D/w RN.      Intervention Category Major Interventions: Hyperglycemia - active titration of insulin therapy  Margaretmary Lombard 07/26/2022, 9:58 PM  Addendum at 2:15 am Agitated and fighting the vent while on Propofol and despite getting fentanyl pushes Planned to remain intubated given airway issues Start fentanyl drip

## 2022-07-26 NOTE — Progress Notes (Signed)
Slight rise in troponin Expected post arrest - trend to capture peak  Abb Gobert V. Elsworth Soho MD

## 2022-07-26 NOTE — Progress Notes (Signed)
ETT advanced from 24cm to 26 cm at tongue per MD.

## 2022-07-26 NOTE — Procedures (Signed)
Central Venous Catheter Insertion Procedure Note  Gregory Alvarado  [id number]  [birth year]  Date:07/26/22  Time:1:42 PM   Provider Performing:Rayneisha Bouza Cipriano Mile   Procedure: Insertion of Non-tunneled Central Venous [phone]) with US guidance ([id number])   Indication(s) Medication administration  Consent Risks of the procedure as well as the alternatives and risks of each were explained to the patient and/or caregiver.  Consent for the procedure was obtained and is signed in the bedside chart  Anesthesia Topical only with 1% lidocaine   Timeout Verified patient identification, verified procedure, site/side was marked, verified correct patient position, special equipment/implants available, medications/allergies/relevant history reviewed, required imaging and test results available.  Sterile Technique Maximal sterile technique including full sterile barrier drape, hand hygiene, sterile gown, sterile gloves, mask, hair covering, sterile ultrasound probe cover (if used).  Procedure Description Area of catheter insertion was cleaned with chlorhexidine and draped in sterile fashion.  With real-time ultrasound guidance a central venous catheter was placed into the left internal jugular vein. Nonpulsatile blood flow and easy flushing noted in all ports.  The catheter was sutured in place and sterile dressing applied.  Complications/Tolerance None; patient tolerated the procedure well. Chest X-ray is ordered to verify placement for internal jugular or subclavian cannulation.   Chest x-ray is not ordered for femoral cannulation.  EBL Minimal  Specimen(s) None

## 2022-07-26 NOTE — Progress Notes (Signed)
   NAME:  Gregory Alvarado, MRN:  [id number], DOB:  05-25-1951, LOS: 1 ADMISSION DATE:  07/25/2022, CONSULTATION DATE:  11/11 REFERRING MD:  Roderic Palau, CHIEF COMPLAINT:  angioedema   History of Present Illness:  Patient is encephalopathic and/or intubated. Therefore history has been obtained from chart review.   Gregory Alvarado, is a 71 y.o. 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  Reports of patient taking lisinopril.  He developed worsening hypoxia in the [facility]. Intubation was attempted and failed, emergent tracheostomy was attempted without success.  The patient was intubated.  After intubation patient bradycardia down to 39, pulse was loss, around 1 minute of CPR with ROSC.  PCCM was consulted for transfer to [place] and admission.  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 Remains on epi drip.  Objective   Blood pressure (!) 122/37, pulse (!) 120, temperature 99.2 F (37.3 C), temperature source Oral, resp. rate (!) 26, height '5\' 9"'$  (1.753 m), weight 131.6 kg, SpO2 100 %.    Vent Mode: PRVC FiO2 (%):  [60 %-100 %] 60 % Set Rate:  [18 bmp-26 bmp] 26 bmp Vt Set:  [560 mL-570 mL] 560 mL PEEP:  [8 cmH20] 8 cmH20 Plateau Pressure:  [24 cmH20-27 cmH20] 24 cmH20    Intake/Output Summary (Last 24 hours) at 07/26/2022 0254 Last data filed at 07/26/2022 0600 Gross per 24 hour  Intake 829.07 ml  Output 675 ml  Net 154.07 ml    Filed Weights   07/25/22 1617 07/26/22 0000  Weight: 131.5 kg 131.6 kg    Examination: No distress sedated on vent Pupils equal reactive Ext warm Abd soft Tongue swelling present, better per nursing  CXR atelectasis vs. Wet vs both  Resolved Hospital Problem list    Assessment & Plan:  ACEi angioedema Respiratory associated cardiac arrest- due to inability to obtain airway, brief.  Following commands on vent after. Post arrest shock state Mild post arrest trop leak, hx CAD DM2 with hyperglycemia Post arrest acute renal injury HTN COPD Tobacco abuse  - Keep heavily sedated on vent today while things settle out - Check renal US -Trops to peak, not really concerned about cardiac event; echo is ordered; if WMA can consider heparin gtt - DC steroids, switch epi back to levo, not c/w anaphylaxis here given presentation - Endotool while sugars settle out - Ceftriaxone x 3 days for probable aspiration during code - Central line for pressors today  Best Practice (right click and "Reselect all SmartList Selections" daily)   Diet/type: start TF DVT prophylaxis: prophylactic heparin  GI prophylaxis: H2B Lines: N/A Foley:  Yes, and it is still needed Code Status:  full code Last date of multidisciplinary goals of care discussion [pending]  34 min cc time Erskine Emery MD PCCM

## 2022-07-26 NOTE — Progress Notes (Signed)
An USGPIV (ultrasound guided PIV) has been placed for short-term vasopressor infusion. A correctly placed ivWatch must be used when administering Vasopressors. Should this treatment be needed beyond 72 hours, central line access should be obtained.  It will be the responsibility of the bedside nurse to follow best practice to prevent extravasations.   ?

## 2022-07-27 ENCOUNTER — Encounter (HOSPITAL_COMMUNITY): Payer: Self-pay | Admitting: Hematology

## 2022-07-27 DIAGNOSIS — T783XXS Angioneurotic edema, sequela: Secondary | ICD-10-CM | POA: Diagnosis not present

## 2022-07-27 DIAGNOSIS — J9621 Acute and chronic respiratory failure with hypoxia: Secondary | ICD-10-CM | POA: Diagnosis not present

## 2022-07-27 LAB — BASIC METABOLIC PANEL
Anion gap: 9 (ref 5–15)
BUN: 38 mg/dL — ABNORMAL HIGH (ref 8–23)
CO2: 25 mmol/L (ref 22–32)
Calcium: 8.7 mg/dL — ABNORMAL LOW (ref 8.9–10.3)
Chloride: 99 mmol/L (ref 98–111)
Creatinine, Ser: 1.39 mg/dL — ABNORMAL HIGH (ref 0.61–1.24)
GFR, Estimated: 55 mL/min — ABNORMAL LOW (ref >60)
Glucose, Bld: 185 mg/dL — ABNORMAL HIGH (ref 70–99)
Potassium: 4.5 mmol/L (ref 3.5–5.1)
Sodium: 133 mmol/L — ABNORMAL LOW (ref 135–145)

## 2022-07-27 LAB — GLUCOSE, CAPILLARY
Glucose-Capillary: 179 mg/dL — ABNORMAL HIGH (ref 70–99)
Glucose-Capillary: 179 mg/dL — ABNORMAL HIGH (ref 70–99)
Glucose-Capillary: 192 mg/dL — ABNORMAL HIGH (ref 70–99)
Glucose-Capillary: 150 mg/dL — ABNORMAL HIGH (ref 70–99)
Glucose-Capillary: 145 mg/dL — ABNORMAL HIGH (ref 70–99)
Glucose-Capillary: 146 mg/dL — ABNORMAL HIGH (ref 70–99)

## 2022-07-27 LAB — TROPONIN I (HIGH SENSITIVITY): Troponin I (High Sensitivity): 155 ng/L (ref <18)

## 2022-07-27 MED ORDER — FENTANYL CITRATE PF 50 MCG/ML IJ SOSY
25.0000 ug | PREFILLED_SYRINGE | Freq: Once | INTRAMUSCULAR | Status: AC
  Administered 2022-07-27: 25 ug via INTRAVENOUS
  Filled 2022-07-27: qty 1

## 2022-07-27 MED ORDER — FENTANYL 2500MCG IN NS 250ML (10MCG/ML) PREMIX INFUSION
25.0000 ug/h | INTRAVENOUS | Status: DC
  Administered 2022-07-27: 25 ug/h via INTRAVENOUS
  Administered 2022-07-28: 75 ug/h via INTRAVENOUS
  Filled 2022-07-27 (×2): qty 250

## 2022-07-27 MED ORDER — INSULIN ASPART 100 UNIT/ML IJ SOLN
5.0000 [IU] | INTRAMUSCULAR | Status: DC
  Administered 2022-07-27 – 2022-07-28 (×7): 5 [IU] via SUBCUTANEOUS

## 2022-07-27 MED ORDER — PROSOURCE TF20 ENFIT COMPATIBL EN LIQD
60.0000 mL | Freq: Two times a day (BID) | ENTERAL | Status: DC
  Administered 2022-07-27 – 2022-07-28 (×3): 60 mL
  Filled 2022-07-27 (×3): qty 60

## 2022-07-27 MED ORDER — POLYETHYLENE GLYCOL 3350 17 G PO PACK
17.0000 g | PACK | Freq: Every day | ORAL | Status: DC
  Administered 2022-07-27: 17 g
  Filled 2022-07-27: qty 1

## 2022-07-27 MED ORDER — VITAL HIGH PROTEIN PO LIQD
1000.0000 mL | ORAL | Status: DC
  Administered 2022-07-28: 1000 mL

## 2022-07-27 MED ORDER — FENTANYL BOLUS VIA INFUSION
25.0000 ug | INTRAVENOUS | Status: DC | PRN
  Administered 2022-07-27: 100 ug via INTRAVENOUS
  Administered 2022-07-27: 50 ug via INTRAVENOUS
  Administered 2022-07-27: 100 ug via INTRAVENOUS
  Administered 2022-07-27 (×2): 50 ug via INTRAVENOUS
  Administered 2022-07-27: 25 ug via INTRAVENOUS
  Administered 2022-07-28: 50 ug via INTRAVENOUS

## 2022-07-27 MED ORDER — DOCUSATE SODIUM 50 MG/5ML PO LIQD
100.0000 mg | Freq: Two times a day (BID) | ORAL | Status: DC
  Administered 2022-07-27 (×2): 100 mg
  Filled 2022-07-27 (×2): qty 10

## 2022-07-27 NOTE — Progress Notes (Signed)
NAME:  Gregory Alvarado, MRN:  [id number], DOB:  1951-06-18, LOS: 2 ADMISSION DATE:  07/25/2022, CONSULTATION DATE:  11/11 REFERRING MD:  Roderic Palau, CHIEF COMPLAINT:  angioedema   History of Present Illness:  Patient is encephalopathic and/or intubated. Therefore history has been obtained from chart review.   Gregory Alvarado, is a 71 y.o. 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  Reports of patient taking lisinopril.  He developed worsening hypoxia in the [facility]. Intubation was attempted and failed, emergent tracheostomy was attempted without success.  The patient was intubated.  After intubation patient bradycardia down to 39, pulse was loss, around 1 minute of CPR with ROSC.  PCCM was consulted for transfer to [facility] and admission.  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  Wakes up and follows commands but needing propofol for comfort.  Was on insulin drip overnight.  Had cuff leak yesterday per RT.   Objective   Blood pressure 130/66, pulse 99, temperature 98.4 F (36.9 C), temperature source Oral, resp. rate 20, height '5\' 9"'$  (1.753 m), weight 132.1 kg, SpO2 100 %.    Vent Mode: PRVC FiO2 (%):  [40 %] 40 % Set Rate:  [26 bmp] 26 bmp Vt Set:  [560 mL] 560 mL PEEP:  [8 cmH20] 8  cmH20 Plateau Pressure:  [21 cmH20-22 cmH20] 22 cmH20   Intake/Output Summary (Last 24 hours) at 07/27/2022 0830 Last data filed at 07/27/2022 0800 Gross per 24 hour  Intake 5201.54 ml  Output 1775 ml  Net 3426.54 ml   Filed Weights   07/25/22 1617 07/26/22 0000 07/27/22 0500  Weight: 131.5 kg 131.6 kg 132.1 kg    Examination: Gen:      Intubated, sedated, acutely ill appearing HEENT:  ETT to vent, 6.0, some mild inspiratory stridor, +cuffleak Lungs:    sounds of mechanical ventilation auscultated CV:         RRR no mrg Abd:      + bowel sounds; soft, non-tender; no palpable masses, no distension Ext:    No edema Skin:      Warm and dry; no rashes Neuro:   sedated, RASS -2, moves all 4 extremities   Resolved Hospital Problem list    Assessment & Plan:  Acute on chronic hypoxemic respiratory failure ACEi angioedema - 6.0 ETT placed here in ED - Ceftriaxone x 3 days for probable aspiration during code - will assess again today for cuff leak, SAT/SBT.  - extubation should be done cautiously - he needs to have a larger cuff leak to err on the side of caution given risk for airway edema.  - may need extubation to bipap as well given copd and chronic respiratory failure  Respiratory associated cardiac arrest- due to inability to obtain airway, brief.  Following commands  on vent after. Circulatory Shock Mild post arrest trop leak, hx CAD - appears neuro intact after arrest - continue sedation with propofol and fentanyl - echo reviewed shows LVEF reduced at 45% with akinetic apex. This appears to be a new WMA - there was a mild troponin elevation - will repeat today to make sure this has peaked.  - likely this is secondary to myocardial stunning post arrest. EKGs reviewed on 11/11, no dynamic ST segment changes on admission - may require inpatient cardiology consultation or at least outpatient follow up with repeat echo given h/o CAD.    DM2 with hyperglycemia - was on insulin  gtt  - will advance tube feeds, schedule tube feed coverage - A1C 7.7% not on insulin at home  Post arrest acute renal injury - improving, non-oliguric - stop IVF  HTN  COPD on home oxygen Tobacco abuse   Best Practice (right click and "Reselect all SmartList Selections" daily)   Diet/type: start TF - advance to goal today if not extubating.  DVT prophylaxis: prophylactic heparin  GI prophylaxis: H2B Lines: N/A Foley:  Yes, and it is still needed Code Status:  full code Last date of multidisciplinary goals of care discussion [pending]  The patient is critically ill due to shock, resipratory failure.  Critical care was necessary to treat or prevent imminent or life-threatening deterioration.  Critical care was time spent personally by me on the following activities: development of treatment plan with patient and/or surrogate as well as nursing, discussions with consultants, evaluation of patient's response to treatment, examination of patient, obtaining history from patient or surrogate, ordering and performing treatments and interventions, ordering and review of laboratory studies, ordering and review of radiographic studies, pulse oximetry, re-evaluation of patient's condition and participation in multidisciplinary rounds.   Critical Care Time devoted to patient care services described in this note is 36 minutes. This time reflects time of care of this [place] . This critical care time does not reflect separately billable procedures or procedure time, teaching time or supervisory time of PA/NP/Med student/Med Resident etc but could involve care discussion time.       Spero Geralds [place] [facility] 07/27/2022 8:34 AM  Pager: see AMION  If no response to pager , please call critical care on call (see AMION) until 7pm After 7:00 pm call Elink

## 2022-07-27 NOTE — Progress Notes (Signed)
Inpatient Diabetes Program Recommendations  AACE/ADA: New Consensus Statement on Inpatient Glycemic Control (2015)  Target Ranges:  Prepandial:   less than 140 mg/dL      Peak postprandial:   less than 180 mg/dL (1-2 hours)      Critically ill patients:  140 - 180 mg/dL   Lab Results  Component Value Date   GLUCAP 192 (H) 07/27/2022   HGBA1C 7.7 (H) 07/26/2022    Review of Glycemic Control  Latest Reference Range & Units 07/26/22 22:01 07/26/22 23:17 07/27/22 03:22 07/27/22 07:48 07/27/22 11:07  Glucose-Capillary 70 - 99 mg/dL 155 (H) 154 (H) 179 (H) 179 (H) 192 (H)   Diabetes history: DM 2 Outpatient Diabetes medications:  Novolin 70/30 40 units bid Current orders for Inpatient glycemic control:  Novolog 5 units q 4 hours Novolog 2-6 units q 4 hours  Inpatient Diabetes Program Recommendations:    If blood sugars trend up, consider adding Levemir 12 units bid.   Thanks,  Adah Perl, RN, BC-ADM Inpatient Diabetes Coordinator Pager [phone] (8a-5p)

## 2022-07-27 NOTE — Plan of Care (Signed)
  Problem: Education: Goal: Knowledge of General Education information will improve Description: Including pain rating scale, medication(s)/side effects and non-pharmacologic comfort measures Outcome: Progressing   Problem: Health Behavior/Discharge Planning: Goal: Ability to manage health-related needs will improve Outcome: Progressing   Problem: Clinical Measurements: Goal: Ability to maintain clinical measurements within normal limits will improve Outcome: Progressing Goal: Will remain free from infection Outcome: Progressing Goal: Diagnostic test results will improve Outcome: Progressing Goal: Respiratory complications will improve Outcome: Progressing Goal: Cardiovascular complication will be avoided Outcome: Progressing   Problem: Activity: Goal: Risk for activity intolerance will decrease Outcome: Progressing   Problem: Nutrition: Goal: Adequate nutrition will be maintained Outcome: Progressing   Problem: Coping: Goal: Level of anxiety will decrease Outcome: Progressing   Problem: Elimination: Goal: Will not experience complications related to bowel motility Outcome: Progressing Goal: Will not experience complications related to urinary retention Outcome: Progressing   Problem: Pain Managment: Goal: General experience of comfort will improve Outcome: Progressing   Problem: Safety: Goal: Ability to remain free from injury will improve Outcome: Progressing   Problem: Skin Integrity: Goal: Risk for impaired skin integrity will decrease Outcome: Progressing   Problem: Education: Goal: Ability to describe self-care measures that may prevent or decrease complications (Diabetes Survival Skills Education) will improve Outcome: Progressing Goal: Individualized Educational Video(s) Outcome: Progressing   Problem: Coping: Goal: Ability to adjust to condition or change in health will improve Outcome: Progressing   Problem: Fluid Volume: Goal: Ability to  maintain a balanced intake and output will improve Outcome: Progressing   Problem: Health Behavior/Discharge Planning: Goal: Ability to identify and utilize available resources and services will improve Outcome: Progressing Goal: Ability to manage health-related needs will improve Outcome: Progressing   Problem: Metabolic: Goal: Ability to maintain appropriate glucose levels will improve Outcome: Progressing   Problem: Nutritional: Goal: Maintenance of adequate nutrition will improve Outcome: Progressing Goal: Progress toward achieving an optimal weight will improve Outcome: Progressing   Problem: Skin Integrity: Goal: Risk for impaired skin integrity will decrease Outcome: Progressing   Problem: Tissue Perfusion: Goal: Adequacy of tissue perfusion will improve Outcome: Progressing   Problem: Safety: Goal: Non-violent Restraint(s) Outcome: Progressing   Problem: Activity: Goal: Ability to tolerate increased activity will improve Outcome: Progressing   Problem: Respiratory: Goal: Ability to maintain a clear airway and adequate ventilation will improve Outcome: Progressing   Problem: Role Relationship: Goal: Method of communication will improve Outcome: Progressing   Problem: Education: Goal: Ability to describe self-care measures that may prevent or decrease complications (Diabetes Survival Skills Education) will improve Outcome: Progressing Goal: Individualized Educational Video(s) Outcome: Progressing   Problem: Cardiac: Goal: Ability to maintain an adequate cardiac output will improve Outcome: Progressing   Problem: Health Behavior/Discharge Planning: Goal: Ability to identify and utilize available resources and services will improve Outcome: Progressing Goal: Ability to manage health-related needs will improve Outcome: Progressing   Problem: Fluid Volume: Goal: Ability to achieve a balanced intake and output will improve Outcome: Progressing    Problem: Metabolic: Goal: Ability to maintain appropriate glucose levels will improve Outcome: Progressing   Problem: Nutritional: Goal: Maintenance of adequate nutrition will improve Outcome: Progressing Goal: Maintenance of adequate weight for body size and type will improve Outcome: Progressing   Problem: Respiratory: Goal: Will regain and/or maintain adequate ventilation Outcome: Progressing   Problem: Urinary Elimination: Goal: Ability to achieve and maintain adequate renal perfusion and functioning will improve Outcome: Progressing

## 2022-07-27 NOTE — Progress Notes (Addendum)
Patient placed back on full vent support due to increased HR, BP, and desat.  On full vent support patient double stacking breaths and peak pressuring.   RT changed HME and Hepa filters without improvement.  RT and RN bag lavaged patient and obtained a large plug.  Patient placed back on full vent support with improved peak pressures and a normal breathing pattern.  RT will continue to monitor.

## 2022-07-27 NOTE — Progress Notes (Signed)
Initial Nutrition Assessment  DOCUMENTATION CODES:   Not applicable  INTERVENTION:   Tube feeding via NG tube: - Increase Vital High Protein to 55 ml/hr (1320 ml/day) - PROSource TF20 60 ml BID  Tube feeding regimen provides 1480 kcal, 155 grams of protein, and 1104 ml of H2O.   Tube feeding regimen and current propofol provides 1897 total kcal (100% of needs).  NUTRITION DIAGNOSIS:   Inadequate oral intake related to inability to eat as evidenced by NPO status.  GOAL:   Patient will meet greater than or equal to 90% of their needs  MONITOR:   Vent status, Labs, Weight trends, TF tolerance  REASON FOR ASSESSMENT:   Ventilator, Consult Enteral/tube feeding initiation and management  ASSESSMENT:   71 year old male who presented to the ED on 11/11 with severe angioedema. Emergent tracheostomy was attempted without success. Pt was eventually intubated and had a short cardiac arrest in ED. 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  Discussed pt with RN and during ICU rounds. Consult received for trickle tube feeding initiation. Pt with NG tube tip and side port below GE junction per x-ray. Discussed pt with PCCM. Okay to advance tube feeds to goal. Pt may be a candidate for extubation later today.  Unable to obtain diet and weight history at this time. Reviewed weight history in chart. Pt with a 4 kg weight loss since 11/24/21. This is a 3% weight loss which is not significant for timeframe.  Current TF: Vital High Protein @ 20 ml/hr  Patient is currently intubated on ventilator support MV: 15.4 L/min Temp (24hrs), Avg:99 F (37.2 C), Min:98.4 F (36.9 C), Max:100.2 F (37.9 C)  Drips: Propofol: 20 mcg/kg/min (provides 417 kcal daily from lipid) Fentanyl  Medications reviewed and include: colace, SSI q 4 hours, novolog 5 units q 4 hours, miralax, IV abx, IV pepcid  Labs reviewed:  sodium 133, BUN 38, creatinine 1.39, phosphorus 1.7 on 11/12, WBC 36.6, hemoglobin 10.0 CBG's: 138-452 x 24 hours (trending down, on insulin drip overnight)  UOP: 1775 ml x 24 hours I/O's: +4.3 L since admit  NUTRITION - FOCUSED PHYSICAL EXAM:  Flowsheet Row Most Recent Value  Orbital Region No depletion  Upper Arm Region No depletion  Thoracic and Lumbar Region No depletion  Buccal Region Unable to assess  Temple Region No depletion  Clavicle Bone Region No depletion  Clavicle and Acromion Bone Region No depletion  Scapular Bone Region No depletion  Dorsal Hand No depletion  Patellar Region No depletion  Anterior Thigh Region No depletion  Posterior Calf Region No depletion  Edema (RD Assessment) Mild  [BLE]  Hair Reviewed  Eyes Reviewed  Mouth Reviewed  Skin Reviewed  Nails Reviewed       Diet Order:   Diet Order             Diet NPO time specified  Diet effective now                   EDUCATION NEEDS:   No education needs have been identified at this time  Skin:  Skin Assessment: Reviewed RN Assessment (closed incision to neck)  Last BM:  07/25/22  Height:   Ht Readings from Last 1 Encounters:  07/26/22 '5\' 9"'$  (1.753 m)    Weight:   Wt Readings from Last 1 Encounters:  07/27/22 132.1 kg    Ideal Body Weight:  72.7 kg  BMI:  Body mass index is 43.01 kg/m.  Estimated Nutritional Needs:   Kcal:  1800-2000  Protein:  145-160 grams  Fluid:  1.8-2.0 L    Gustavus Bryant, MS, RD, LDN Inpatient Clinical Dietitian Please see AMiON for contact information.

## 2022-07-28 ENCOUNTER — Inpatient Hospital Stay (HOSPITAL_COMMUNITY): Payer: Medicare Other

## 2022-07-28 DIAGNOSIS — J9601 Acute respiratory failure with hypoxia: Secondary | ICD-10-CM | POA: Diagnosis not present

## 2022-07-28 DIAGNOSIS — I2489 Other forms of acute ischemic heart disease: Secondary | ICD-10-CM

## 2022-07-28 LAB — CBC WITH DIFFERENTIAL/PLATELET
Abs Immature Granulocytes: 0.11 10*3/uL — ABNORMAL HIGH (ref 0.00–0.07)
Basophils Absolute: 0.1 10*3/uL (ref 0.0–0.1)
Basophils Relative: 0 %
Eosinophils Absolute: 0.1 10*3/uL (ref 0.0–0.5)
Eosinophils Relative: 1 %
HCT: 26.6 % — ABNORMAL LOW (ref 39.0–52.0)
Hemoglobin: 7.7 g/dL — ABNORMAL LOW (ref 13.0–17.0)
Immature Granulocytes: 1 %
Lymphocytes Relative: 7 %
Lymphs Abs: 1 10*3/uL (ref 0.7–4.0)
MCH: 23 pg — ABNORMAL LOW (ref 26.0–34.0)
MCHC: 28.9 g/dL — ABNORMAL LOW (ref 30.0–36.0)
MCV: 79.4 fL — ABNORMAL LOW (ref 80.0–100.0)
Monocytes Absolute: 0.7 10*3/uL (ref 0.1–1.0)
Monocytes Relative: 5 %
Neutro Abs: 12.2 10*3/uL — ABNORMAL HIGH (ref 1.7–7.7)
Neutrophils Relative %: 86 %
Platelets: 185 10*3/uL (ref 150–400)
RBC: 3.35 MIL/uL — ABNORMAL LOW (ref 4.22–5.81)
RDW: 17.5 % — ABNORMAL HIGH (ref 11.5–15.5)
WBC: 14.1 10*3/uL — ABNORMAL HIGH (ref 4.0–10.5)
nRBC: 0 % (ref 0.0–0.2)

## 2022-07-28 LAB — BASIC METABOLIC PANEL
Anion gap: 8 (ref 5–15)
BUN: 44 mg/dL — ABNORMAL HIGH (ref 8–23)
CO2: 26 mmol/L (ref 22–32)
Calcium: 9 mg/dL (ref 8.9–10.3)
Chloride: 104 mmol/L (ref 98–111)
Creatinine, Ser: 1.27 mg/dL — ABNORMAL HIGH (ref 0.61–1.24)
GFR, Estimated: >60 mL/min (ref >60)
Glucose, Bld: 164 mg/dL — ABNORMAL HIGH (ref 70–99)
Potassium: 4.1 mmol/L (ref 3.5–5.1)
Sodium: 138 mmol/L (ref 135–145)

## 2022-07-28 LAB — GLUCOSE, CAPILLARY
Glucose-Capillary: 162 mg/dL — ABNORMAL HIGH (ref 70–99)
Glucose-Capillary: 169 mg/dL — ABNORMAL HIGH (ref 70–99)
Glucose-Capillary: 161 mg/dL — ABNORMAL HIGH (ref 70–99)
Glucose-Capillary: 196 mg/dL — ABNORMAL HIGH (ref 70–99)
Glucose-Capillary: 159 mg/dL — ABNORMAL HIGH (ref 70–99)
Glucose-Capillary: 129 mg/dL — ABNORMAL HIGH (ref 70–99)
Glucose-Capillary: 153 mg/dL — ABNORMAL HIGH (ref 70–99)

## 2022-07-28 MED ORDER — ORAL CARE MOUTH RINSE: 15.0000 mL | OROMUCOSAL | Status: DC | PRN

## 2022-07-28 MED ORDER — FUROSEMIDE 10 MG/ML IJ SOLN
INTRAMUSCULAR | Status: AC
  Filled 2022-07-28: qty 8

## 2022-07-28 MED ORDER — INSULIN ASPART 100 UNIT/ML IJ SOLN
0.0000 [IU] | INTRAMUSCULAR | Status: DC
  Administered 2022-07-28 (×3): 4 [IU] via SUBCUTANEOUS
  Administered 2022-07-29: 2 [IU] via SUBCUTANEOUS
  Administered 2022-07-29 (×2): 4 [IU] via SUBCUTANEOUS

## 2022-07-28 MED ORDER — POTASSIUM CHLORIDE 20 MEQ PO PACK
40.0000 meq | PACK | Freq: Once | ORAL | Status: AC
  Administered 2022-07-28: 40 meq
  Filled 2022-07-28: qty 2

## 2022-07-28 MED ORDER — FUROSEMIDE 10 MG/ML IJ SOLN
80.0000 mg | Freq: Once | INTRAMUSCULAR | Status: AC
  Administered 2022-07-28: 80 mg via INTRAVENOUS

## 2022-07-28 MED ORDER — DEXMEDETOMIDINE HCL IN NACL 400 MCG/100ML IV SOLN
INTRAVENOUS | Status: AC
  Filled 2022-07-28: qty 100

## 2022-07-28 MED ORDER — FAMOTIDINE 20 MG PO TABS
20.0000 mg | ORAL_TABLET | Freq: Two times a day (BID) | ORAL | Status: DC
  Administered 2022-07-28: 20 mg
  Filled 2022-07-28: qty 1

## 2022-07-28 MED ORDER — MELATONIN 3 MG PO TABS
3.0000 mg | ORAL_TABLET | Freq: Every evening | ORAL | Status: DC | PRN
  Administered 2022-07-28: 3 mg via ORAL
  Filled 2022-07-28: qty 1

## 2022-07-28 MED ORDER — DEXMEDETOMIDINE HCL IN NACL 400 MCG/100ML IV SOLN
0.4000 ug/kg/h | INTRAVENOUS | Status: DC
  Administered 2022-07-28: 0.4 ug/kg/h via INTRAVENOUS
  Administered 2022-07-28: 1.2 ug/kg/h via INTRAVENOUS
  Filled 2022-07-28: qty 100

## 2022-07-28 MED ORDER — FAMOTIDINE 20 MG PO TABS
20.0000 mg | ORAL_TABLET | Freq: Two times a day (BID) | ORAL | Status: DC
  Administered 2022-07-28 – 2022-07-29 (×2): 20 mg via ORAL
  Filled 2022-07-28 (×2): qty 1

## 2022-07-28 MED ORDER — METOPROLOL SUCCINATE ER 50 MG PO TB24
150.0000 mg | ORAL_TABLET | Freq: Every day | ORAL | Status: DC
  Administered 2022-07-28 – 2022-07-31 (×4): 150 mg via ORAL
  Filled 2022-07-28: qty 3
  Filled 2022-07-28 (×2): qty 1
  Filled 2022-07-28: qty 3

## 2022-07-28 NOTE — Progress Notes (Signed)
NAME:  Gregory Alvarado, MRN:  [id number], DOB:  April 21, 1951, LOS: 3 ADMISSION DATE:  07/25/2022, CONSULTATION DATE:  11/11 REFERRING MD:  Roderic Palau, CHIEF COMPLAINT:  angioedema   History of Present Illness:  Patient is encephalopathic and/or intubated. Therefore history has been obtained from chart review.   Gregory Alvarado, is a 71 y.o. 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  Reports of patient taking lisinopril.  He developed worsening hypoxia in the [facility]. Intubation was attempted and failed, emergent tracheostomy was attempted without success.  The patient was intubated.  After intubation patient bradycardia down to 39, pulse was loss, around 1 minute of CPR with ROSC.  PCCM was consulted for transfer to [facility] and admission.  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 rate 12, height '5\' 9"'$  (1.753 m), weight 134.1 kg, SpO2 99 %.    Vent Mode: PSV;CPAP FiO2 (%):  [40 %] 40 % Set Rate:  [26 bmp] 26 bmp Vt Set:  [550 mL-560 mL] 550 mL PEEP:  [8 cmH20] 8 cmH20 Pressure Support:  [5 cmH20] 5 cmH20 Plateau  Pressure:  [22 cmH20] 22 cmH20   Intake/Output Summary (Last 24 hours) at 07/28/2022 0931 Last data filed at 07/28/2022 0900 Gross per 24 hour  Intake 2007.15 ml  Output 2875 ml  Net -867.85 ml   Filed Weights   07/26/22 0000 07/27/22 0500 07/28/22 0500  Weight: 131.6 kg 132.1 kg 134.1 kg    Examination: Gen:      Intubated, sedated, awake, alert HEENT:  ETT to vent - no stridor, +cuff leak Lungs:    sounds of mechanical ventilation auscultated no wheeze CV:         tachycardic, regular Abd:      + bowel sounds; soft, non-tender; no palpable masses, no distension Ext:    No edema Skin:      Warm and dry; no rashes Neuro:   sedated, RASS =1, moves all 4 extremities    Resolved Hospital Problem list   Shock  Assessment & Plan:  Acute on chronic hypoxemic respiratory failure COPD on home oxygen 3LNC ACEi angioedema - 6.0 ETT placed here in ED - Ceftriaxone x 3 days for probable aspiration during code - extubated today with bipap and surgical airway equipment at bedside given previously difficult intubation. - will diurese with IV lasix  Respiratory associated cardiac arrest Elevated Troponin, likely demand ischemia, hx CAD - appears neuro intact after arrest - continue sedation with propofol and fentanyl - echo reviewed shows LVEF reduced at 45% with akinetic apex. This appears to be a new WMA - there was a  mild troponin elevation which peaked at 340, suspect demand - will need cardiology consultation to follow up [place]-  has seen Dr. Harl Bowie in clinic before.    DM2 with hyperglycemia - maintain SSI today now that tube feeds are off.  - A1C 7.7% not on insulin at home  Post arrest acute renal injury - improving, non-oliguric - stop IVF - diurese today  HTN - will add back home meds as needed, previously was hypotensive   Best Practice (right click and "Reselect all SmartList Selections" daily)   Diet/type: NPO now that he's extubated, will need SLP evaluation  before advancing diet.  DVT prophylaxis: prophylactic heparin  GI prophylaxis: H2B Lines: N/A Foley:  Yes, and it is still needed - will consider removal later today pending clinical status Code Status:  full code Last date of multidisciplinary goals of care discussion [son updated over the phone 11/13 and is planning to come up today]  The patient is critically ill due to respiratory failure.  Critical care was necessary to treat or prevent imminent or life-threatening deterioration.  Critical care was time spent personally by me on the following activities: development of treatment plan with patient and/or surrogate as well as nursing, discussions with consultants, evaluation of patient's response to treatment, examination of patient, obtaining history from patient or surrogate, ordering and performing treatments and interventions, ordering and review of laboratory studies, ordering and review of radiographic studies, pulse oximetry, re-evaluation of patient's condition and participation in multidisciplinary rounds.   Critical Care Time devoted to patient care services described in this note is 40 minutes. This time reflects time of care of this [place] . This critical care time does not reflect separately billable procedures or procedure time, teaching time or supervisory time of PA/NP/Med student/Med Resident etc but could involve care discussion time.       Spero Geralds [place] [facility] 07/28/2022 9:32 AM  Pager: see AMION  If no response to pager , please call critical care on call (see AMION) until 7pm After 7:00 pm call Elink

## 2022-07-28 NOTE — Progress Notes (Signed)
Patient aware of QHS order for BIPAP and refusing to wear machine. Patient is tolerating 6L [place] well with oxygen saturations of 99% and no increased WOB. RT made patient aware that if his WOB increases or oxygen saturations drop he will be placed on BIPAP for the night. Patient claims he understood.

## 2022-07-28 NOTE — Progress Notes (Signed)
eLink Physician-Brief Progress Note Patient Name: AMER ALCINDOR DOB: 1951/02/28 MRN: [id number]   Date of Service  07/28/2022  HPI/Events of Note  Patient needs Toprol XL 150 mg  po daily home medication re-started, he is requesting a sleep aid.  eICU Interventions  Toprol resumed, Melatonin 3 mg  po Q HS prn insomnia x 3 days.        Kerry Kass Kelliann Pendergraph 07/28/2022, 7:56 PM

## 2022-07-28 NOTE — Progress Notes (Signed)
RT called to bedside due to tube displacement. ETT was observed 22cm at the lip. RT advanced tube 3cm to 25cm. Bilateral breath sounds auscultated. STAT CXR ordered to confirm placement.

## 2022-07-28 NOTE — Consult Note (Addendum)
Cardiology Consultation   Patient ID: Gregory Alvarado MRN: [id number]; DOB: 1951/05/03  Admit date: 07/25/2022 Date of Consult: 07/28/2022  PCP:  Gregory Squibb, MD   [place] Prov[facility] Cardiologist:  Gregory Dolly, MD        Patient Profile:   Gregory Alvarado is a 71 y.o. male with a hx of CAD, ischemic cardiomyopathy, chronic HF, COPD, DM type II, HLD, GIB who is being seen 07/28/2022 for the evaluation of elevated troponin, wall motion abnormalities at the request of Dr. Shearon Stalls.  History of Present Illness:   [name] presented to the [facility] ED on 11/11 by EMS for symptoms of tongue swelling that began around 12PM that day following daily dose of Lisinopril. Patient became acutely hypoxic while in the ED with notable angioedema. Initial intubation was unsuccessful and an emergent bedside tracheostomy attempted. This was also unsuccessful 2/2 patient movement. A second intubation attempt was successful but patient was then noted with bradycardia and ultimately became pulseless. Approximately 1 minute of compressions performed with return of spontaneous circulation. Patient remained critical and was transferred to [facility] for further evaluation and management. Patient noted to have troponin elevation following cardiac arrest, 18, 26, 340, 155. Initially attributed to demand ischemia with angioedema/hypoxia. On 11/12, patient received a central line for pressor administration. He also underwent TTE which was notable for LVEF 40-45% and new WMA (akinetic apex, anterior wall hypokinesis). Patient extubated on 11/14, cardiology consulted.  On my exam today, patient is on BiPAP and is sedated. Per nursing at bedside, they are titrating Precedex off. ROS limited due to patient's sedated status. He was able to nod no when asked if he is having chest pain.    Cardiac history:  Patient follows with Dr. Harl Alvarado, last seen 04/04/21. 09/12/20 cath notable for LM LIs, LAD prox 70% and 90%  mid, LCX 80% prox with thrombus, OM2 85%, RCA small non dom 60%. LVEDP elevated but number not given. Received DES to prox/mid LAD, DES to prox LCX, DES to OM2. 08/2020 echo: LVEF 35-40% grade II DDx, mild MR. Echo at that time showed LVEF 35-40%, grade II Ddx, mild MR. Repeat echo in April of 2022 showed recovered LVEF, 60-65%, no WMA. However, another TTE in December of 2022 showed LVED 55-60% and WMA. The mid and distal anterior septum were akinetic. The apical lateral segment, apical anterior segment, apical inferior segment, and apex were hypokinetic.    Past Medical History:  Diagnosis Date   Anemia    Arthritis    left shoulder   COPD (chronic obstructive pulmonary disease) ([place])    Coronary artery disease 09/11/2020   NSTEMI and 3 stents in [facility]   Diabetes mellitus without complication ([place])    Dyslipidemia    Hypertension    Insomnia    Insomnia    Morbid obesity ([place])    Tobacco use     Past Surgical History:  Procedure Laterality Date   BIOPSY  10/28/2019   Procedure: BIOPSY;  Surgeon: Gregory Binder, MD;  [facility];  Service: Endoscopy;;  gastric   BIOPSY  04/15/2020   Procedure: BIOPSY;  Surgeon: Gregory Dolin, MD;  [facility];  Service: Endoscopy;;  gastric   CARDIAC CATHETERIZATION  08/2020   3 stents placed   COLON RESECTION  1990s?   DIVERTICULITIS   COLONOSCOPY WITH PROPOFOL N/A 04/15/2020   Rourk: Poor colon prep. two 6 to 8 mm polyps removed from the  hepatic flexure, tubular adenomas.  Repeat colonoscopy later this year.   ENTEROSCOPY N/A 11/26/2021   Procedure: ENTEROSCOPY;  Surgeon: Gregory Quale, MD;  [facility];  Service: Gastroenterology;  Laterality: N/A;   ENTEROSCOPY N/A 03/13/2022   Procedure: ENTEROSCOPY;  Surgeon: Gregory Harman, DO;  [facility];  Service: Endoscopy;  Laterality: N/A;   ESOPHAGOGASTRODUODENOSCOPY (EGD) WITH PROPOFOL N/A 10/28/2019   Dr. Oneida Alar: Multiple cratered  gastric ulcers, gastritis/duodenitis.  Path showed H. pylori.  Patient treated with amoxicillin/Biaxin/PPI twice daily.   ESOPHAGOGASTRODUODENOSCOPY (EGD) WITH PROPOFOL N/A 04/15/2020   Rourk: Patchy gastric erythema status post biopsy to document eradication of H. pylori.  Biopsy showed persistent H. pylori.  Previously noted gastric ulcer is completely healed.   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   ESOPHAGOGASTRODUODENOSCOPY (EGD) WITH PROPOFOL N/A 03/13/2022   Procedure: ESOPHAGOGASTRODUODENOSCOPY (EGD) WITH PROPOFOL;  Surgeon: Gregory Harman, DO;  [facility];  Service: Endoscopy;  Laterality: N/A;   HERNIA REPAIR     UHR   HOT HEMOSTASIS  11/26/2021   Procedure: HOT HEMOSTASIS (ARGON PLASMA COAGULATION/BICAP);  Surgeon: Gregory Alvarado, Gregory Quince, MD;  [facility];  Service: Gastroenterology;;   Venia Minks DILATION N/A 04/15/2020   Procedure: Gregory Alvarado;  Surgeon: Gregory Dolin, MD;  [facility];  Service: Endoscopy;  Laterality: N/A;   POLYPECTOMY  04/15/2020   Procedure: POLYPECTOMY;  Surgeon: Gregory Dolin, MD;  [facility];  Service: Endoscopy;;  colon   ROTATOR CUFF REPAIR Bilateral    TYMPANOPLASTY       Home Medications:  Prior to Admission medications   Medication Sig Start Date End Date Taking? Authorizing Provider  acetaminophen (TYLENOL) 500 MG tablet Take 1,000 mg by mouth every 6 (six) hours as needed for mild pain.   Yes [provider]  albuterol (VENTOLIN HFA) 108 (90 Base) MCG/ACT inhaler Inhale 2 puffs into the lungs every 4 (four) hours as needed for wheezing or shortness of breath. 07/15/22  Yes [provider]  atorvastatin (LIPITOR) 80 MG tablet Take 80 mg by mouth daily. 01/26/22  Yes [provider]  cyanocobalamin (,VITAMIN B-12,) 1000 MCG/ML injection Inject 1,000 mcg into  the muscle every 30 (thirty) days. 02/11/22  Yes [provider]  diphenhydrAMINE (BENADRYL) 25 mg capsule Take 25 mg by mouth once.   Yes [provider]  diphenhydramine-acetaminophen (TYLENOL PM) 25-500 MG TABS tablet Take 1 tablet by mouth at bedtime as needed (sleep).   Yes [provider]  ferrous sulfate 325 (65 FE) MG tablet Take 1 tablet (325 mg total) by mouth 2 (two) times daily with a meal. 03/16/22  Yes Tat, Shanon Brow, MD  furosemide (LASIX) 40 MG tablet Take 1 tablet (40 mg total) by mouth daily. 03/15/22  Yes Tat, Shanon Brow, MD  gabapentin (NEURONTIN) 300 MG capsule Take 300-900 capsules by mouth See admin instructions. Taking 300 mg in the AM and 900 mg at bedtime. 12/30/20  Yes [provider]  insulin NPH-regular Human (NOVOLIN 70/30) (70-30) 100 UNIT/ML injection Inject 40 Units into the skin in the morning and at bedtime.   Yes [provider]  Ipratropium-Albuterol (COMBIVENT RESPIMAT) 20-100 MCG/ACT AERS respimat Inhale 1 puff into the lungs every 6 (six) hours as needed for wheezing or shortness of breath. 11/27/21  Yes Shah, Pratik D, DO  levocetirizine (XYZAL) 5 MG tablet  Take 5 mg by mouth at bedtime. 03/11/22  Yes [provider]  lisinopril (ZESTRIL) 10 MG tablet Take 1 tablet (10 mg total) by mouth daily. 03/15/22  Yes Tat, Shanon Brow, MD  nitroGLYCERIN (NITROSTAT) 0.4 MG SL tablet DISSOLVE 1 TABLET SUBLINGUALLY AS NEEDED FOR CHEST PAIN, MAY REPEAT EVERY 5 MINUTES. AFTER 3 CALL 911. Patient taking differently: Place 0.4 mg under the tongue every 5 (five) minutes as needed for chest pain. 07/10/22  Yes BranchAlphonse Guild, MD  pantoprazole (PROTONIX) 40 MG tablet Take 40 mg by mouth daily. 03/21/22  Yes [provider]  Potassium 99 MG TABS Take 99 mg by mouth daily as needed (cramps).   Yes [provider]  spironolactone (ALDACTONE) 25 MG tablet TAKE 1 TABLET BY MOUTH ONCE A DAY. 05/22/22  Yes Branch, Alphonse Guild, MD  traMADol  (ULTRAM) 50 MG tablet Take 50-100 mg by mouth in the morning and at bedtime. 09/20/19  Yes [provider]  zolpidem (AMBIEN) 5 MG tablet Take 5 mg by mouth at bedtime as needed for sleep. 09/26/20  Yes [provider]  metoprolol succinate (TOPROL-XL) 100 MG 24 hr tablet TAKE 1 AND 1/2 TABLETS BY MOUTH ONCE A DAY. Patient not taking: Reported on 07/26/2022 02/13/22   Arnoldo Lenis, MD    Inpatient Medications: Scheduled Meds:  Chlorhexidine Gluconate Cloth  6 each Topical Q0600   docusate  100 mg Per Tube BID   famotidine  20 mg Per Tube BID   feeding supplement (PROSource TF20)  60 mL Per Tube BID   heparin  5,000 Units Subcutaneous Q8H   insulin aspart  0-20 Units Subcutaneous Q4H   insulin aspart  5 Units Subcutaneous Q4H   lidocaine (PF)  5 mL Intradermal Once   mouth rinse  15 mL Mouth Rinse Q2H   polyethylene glycol  17 g Per Tube Daily   Continuous Infusions:  sodium chloride 10 mL/hr at 07/28/22 1000   dexmedetomidine (PRECEDEX) IV infusion 1.2 mcg/kg/hr (07/28/22 1000)   feeding supplement (VITAL HIGH PROTEIN) Stopped (07/28/22 0900)   PRN Meds: albuterol, dextrose, fentaNYL, mouth rinse  Allergies:    Allergies  Allergen Reactions   Lisinopril Swelling    ACEi angioedema requiring intubation 07/2022   Caduet [Amlodipine-Atorvastatin] Swelling    Social History:   Social History   Socioeconomic History   Marital status: Divorced    Spouse name: Not on file   Number of children: 2   Years of education: Not on file   Highest education level: High school graduate  Occupational History   Occupation: Retired  Tobacco Use   Smoking status: Every Day    Packs/day: 2.00    Types: Cigarettes   Smokeless tobacco: Never  Vaping Use   Vaping Use: Never used  Substance and Sexual Activity   Alcohol use: Yes    Comment: very rare   Drug use: No   Sexual activity: Not on file  Other Topics Concern   Not on file  Social History Narrative   Not  on file   Social Determinants of Health   Financial Resource Strain: Low Risk  (08/06/2020)   Overall Financial Resource Strain (CARDIA)    Difficulty of Paying Living Expenses: Not very hard  Food Insecurity: No Food Insecurity (08/02/2020)   Hunger Vital Sign    Worried About Running Out of Food in the Last Year: Never true    Ran Out of Food in the Last Year: Never true  Transportation  Needs: No Transportation Needs (08/02/2020)   PRAPARE - Hydrologist (Medical): No    Lack of Transportation (Non-Medical): No  Physical Activity: Inactive (08/06/2020)   Exercise Vital Sign    Days of Exercise per Week: 0 days    Minutes of Exercise per Session: 0 min  Stress: No Stress Concern Present (08/06/2020)   [place]    Feeling of Stress : Not at all  Social Connections: Socially Isolated (08/06/2020)   Social Connection and Isolation Panel [NHANES]    Frequency of Communication with Friends and Family: Three times a week    Frequency of Social Gatherings with Friends and Family: Three times a week    Attends Religious Services: Never    Active Member of Clubs or Organizations: No    Attends Archivist Meetings: Never    Marital Status: Divorced  Human resources officer Violence: Unknown (08/06/2020)   Humiliation, Afraid, Rape, and Kick questionnaire    Fear of Current or Ex-Partner: No    Emotionally Abused: No    Physically Abused: No    Sexually Abused: Not on file    Family History:    Family History  Problem Relation Age of Onset   Diabetes Mother    Heart attack Mother    Heart attack Father    Heart attack Brother    Colon cancer Neg Hx    Stomach cancer Neg Hx      ROS:  Please see the history of present illness.   All other ROS reviewed and negative.     Physical Exam/Data:   Vitals:   07/28/22 0915 07/28/22 0928 07/28/22 0930 07/28/22 1000  BP:  (!) 192/120  125/61 (!) 113/54  Pulse: (!) 133 (!) 126 (!) 118 (!) 105  Resp: (!) 26 (!) 24 (!) 22 (!) 25  Temp:      TempSrc:      SpO2: 90% 96% 95% 96%  Weight:      Height:        Intake/Output Summary (Last 24 hours) at 07/28/2022 1032 Last data filed at 07/28/2022 1000 Gross per 24 hour  Intake 2108.54 ml  Output 2875 ml  Net -766.46 ml      07/28/2022    5:00 AM 07/27/2022    5:00 AM 07/26/2022   12:00 AM  Last 3 Weights  Weight (lbs) 295 lb 10.2 oz 291 lb 3.6 oz 290 lb 2 oz  Weight (kg) 134.1 kg 132.1 kg 131.6 kg     Body mass index is 43.66 kg/m.  General:  Ill appearing, moderate respiratory distress with BiPAP.  HEENT: normal Neck: unable to detect JVD due to body habitus Vascular: No carotid bruits; Distal pulses 2+ bilaterally Cardiac:  normal S1, S2; RRR; no murmur Lungs:  clear to auscultation bilaterally, no wheezing, rhonchi or rales. *Anterior/lateral bases exam only. Abd: soft, nontender, no hepatomegaly  Ext: no edema Musculoskeletal:  No deformities, BUE and BLE strength normal and equal Skin: warm and dry  Neuro:  limited assessment due to sedated status. Has been neurologically intact following cardiac arrest.  Psych:  unable to assess.  EKG:  No new tracing Telemetry:  Telemetry was personally reviewed and demonstrates:  sinus rhythm-sinus tachycardia  Relevant CV Studies:  09/12/20 LHC  Notable for LM LIs, LAD prox 70% and 90% mid, LCX 80% prox with thrombus, OM2 85%, RCA small non dom 60%. LVEDP elevated but number not given.  Received DES to prox/mid LAD, DES to prox LCX, DES to OM2.  07/26/22 TTE  IMPRESSIONS     1. Left ventricular ejection fraction, by estimation, is 40 to 45%. The  left ventricle has mildly decreased function. The left ventricle  demonstrates regional wall motion abnormalities (see scoring  diagram/findings for description). There is moderate  left ventricular hypertrophy. Left ventricular diastolic function could  not be  evaluated.   2. Right ventricular systolic function is mildly reduced. The right  ventricular size is normal.   3. The mitral valve is grossly normal. No evidence of mitral valve  regurgitation. No evidence of mitral stenosis.   4. The aortic valve is tricuspid. Aortic valve regurgitation is not  visualized. No aortic stenosis is present.   FINDINGS   Left Ventricle: Left ventricular ejection fraction, by estimation, is 40  to 45%. The left ventricle has mildly decreased function. The left  ventricle demonstrates regional wall motion abnormalities. Definity  contrast agent was given IV to delineate the  left ventricular endocardial borders. The left ventricular internal cavity  size was normal in size. There is moderate left ventricular hypertrophy.  Left ventricular diastolic function could not be evaluated due to  nondiagnostic images. Left ventricular  diastolic function could not be evaluated.     LV Wall Scoring:  The apex is akinetic. The entire anterior wall, antero-lateral wall,  apical  lateral segment, basal inferior segment, and basal inferoseptal segment  are  hypokinetic. The entire anterior septum, mid and distal inferior wall,  posterior wall, and mid inferoseptal segment are normal.   Right Ventricle: The right ventricular size is normal. Right vetricular  wall thickness was not well visualized. Right ventricular systolic  function is mildly reduced.   Left Atrium: Left atrial size was normal in size.   Right Atrium: Right atrial size was normal in size.   Pericardium: Trivial pericardial effusion is present.   Mitral Valve: The mitral valve is grossly normal. There is mild  calcification of the mitral valve leaflet(s). No evidence of mitral valve  regurgitation. No evidence of mitral valve stenosis.   Tricuspid Valve: The tricuspid valve is not well visualized. Tricuspid  valve regurgitation is not demonstrated. No evidence of tricuspid  stenosis.    Aortic Valve: The aortic valve is tricuspid. Aortic valve regurgitation is  not visualized. No aortic stenosis is present. Aortic valve mean gradient  measures 4.0 mmHg. Aortic valve peak gradient measures 6.9 mmHg. Aortic  valve area, by VTI measures 2.59  cm.   Pulmonic Valve: The pulmonic valve was not well visualized. Pulmonic valve  regurgitation is not visualized. No evidence of pulmonic stenosis.   Aorta: The aortic root and ascending aorta are structurally normal, with  no evidence of dilitation.   Venous: Patient on Vent.   IAS/Shunts: No atrial level shunt detected by color flow Doppler.    Laboratory Data:  High Sensitivity Troponin:   Recent Labs  Lab 07/25/22 1605 07/25/22 1944 07/26/22 0410 07/27/22 0912  TROPONINIHS 18* 26* 340* 155*     Chemistry Recent Labs  Lab 07/26/22 0410 07/26/22 2041 07/27/22 0400 07/28/22 0426  NA 134* 135 133* 138  K 4.7 4.6 4.5 4.1  CL 98 101 99 104  CO2 21* '27 25 26  '$ GLUCOSE 509* 156* 185* 164*  BUN 33* 38* 38* 44*  CREATININE 1.88* 1.42* 1.39* 1.27*  CALCIUM 9.0 9.1 8.7* 9.0  MG 1.8 2.2  --   --   GFRNONAA 38* 53* 55* >  60  ANIONGAP '15 7 9 8    '$ Recent Labs  Lab 07/25/22 1605 07/26/22 0410  PROT 7.4 6.5  ALBUMIN 3.6 3.1*  AST 12* 22  ALT 9 12  ALKPHOS 91 83  BILITOT 0.3 0.2*   Lipids  Recent Labs  Lab 07/26/22 0410  TRIG 219*    Hematology Recent Labs  Lab 07/25/22 1605 07/26/22 0007 07/26/22 0410 07/28/22 0426  WBC 11.5*  --  36.6* 14.1*  RBC 4.49  --  4.28 3.35*  HGB 10.4* 12.2* 10.0* 7.7*  HCT 37.0* 36.0* 34.2* 26.6*  MCV 82.4  --  79.9* 79.4*  MCH 23.2*  --  23.4* 23.0*  MCHC 28.1*  --  29.2* 28.9*  RDW 17.1*  --  17.1* 17.5*  PLT 221  --  294 185   Thyroid No results for input(s): "TSH", "FREET4" in the last 168 hours.  BNP Recent Labs  Lab 07/25/22 1605  BNP 31.0    DDimer No results for input(s): "DDIMER" in the last 168 hours.   Radiology/Studies:  DG CHEST PORT 1  VIEW  Result Date: 07/28/2022 CLINICAL DATA:  Respiratory failure EXAM: PORTABLE CHEST 1 VIEW COMPARISON:  07/26/2022 FINDINGS: Endotracheal tube seen 8.1 cm above the carina, at the level of the clavicular heads. The retaining cuff appears hyperinflated expands the walls of the airway. Nasogastric tube extends into the upper abdomen beyond the margin of the examination. Left internal jugular central venous catheter tip noted within the superior vena cava, unchanged. Opacity at the left lung base may relate to overlying soft tissue. Lungs are otherwise clear. No pneumothorax or pleural effusion. Cardiac size is within normal limits. Pulmonary vascularity is normal. No acute bone abnormality. IMPRESSION: 1. Endotracheal tube 8.1 cm above the carina. The retaining cuff appears hyperinflated expands the walls of the airway. Electronically Signed   By: Fidela Salisbury M.D.   On: 07/28/2022 03:20   ECHOCARDIOGRAM COMPLETE  Result Date: 07/26/2022    ECHOCARDIOGRAM REPORT   Patient Name:   Coreyon Nicotra Name Date of Exam: 07/26/2022 Medical Rec #:  [id number]     Height:       69.0 in Accession #:    [id number]    Weight:       290.1 lb Date of Birth:  01-09-1951     BSA:          2.420 m Patient Age:    33 years      BP:           166/60 mmHg Patient Gender: M             HR:           103 bpm. Exam Location:  Inpatient Procedure: 2D Echo, Cardiac Doppler, Color Doppler and Intracardiac            Opacification Agent Indications:    Shock  History:        Patient has prior history of Echocardiogram examinations, most                 recent 08/28/2021. CHF, CAD, COPD; Risk Factors:Diabetes,                 Dyslipidemia and Current Smoker.  Sonographer:    Clayton Lefort RDCS (AE) Referring Phys: Estill Cotta  Sonographer Comments: Technically difficult study due to poor echo windows. IMPRESSIONS  1. Left ventricular ejection fraction, by estimation, is 40 to 45%. The left ventricle has mildly decreased function.  The left  ventricle demonstrates regional wall motion abnormalities (see scoring diagram/findings for description). There is moderate left ventricular hypertrophy. Left ventricular diastolic function could not be evaluated.  2. Right ventricular systolic function is mildly reduced. The right ventricular size is normal.  3. The mitral valve is grossly normal. No evidence of mitral valve regurgitation. No evidence of mitral stenosis.  4. The aortic valve is tricuspid. Aortic valve regurgitation is not visualized. No aortic stenosis is present. FINDINGS  Left Ventricle: Left ventricular ejection fraction, by estimation, is 40 to 45%. The left ventricle has mildly decreased function. The left ventricle demonstrates regional wall motion abnormalities. Definity contrast agent was given IV to delineate the left ventricular endocardial borders. The left ventricular internal cavity size was normal in size. There is moderate left ventricular hypertrophy. Left ventricular diastolic function could not be evaluated due to nondiagnostic images. Left ventricular diastolic function could not be evaluated.  LV Wall Scoring: The apex is akinetic. The entire anterior wall, antero-lateral wall, apical lateral segment, basal inferior segment, and basal inferoseptal segment are hypokinetic. The entire anterior septum, mid and distal inferior wall, posterior wall, and mid inferoseptal segment are normal. Right Ventricle: The right ventricular size is normal. Right vetricular wall thickness was not well visualized. Right ventricular systolic function is mildly reduced. Left Atrium: Left atrial size was normal in size. Right Atrium: Right atrial size was normal in size. Pericardium: Trivial pericardial effusion is present. Mitral Valve: The mitral valve is grossly normal. There is mild calcification of the mitral valve leaflet(s). No evidence of mitral valve regurgitation. No evidence of mitral valve stenosis. Tricuspid Valve: The tricuspid valve is  not well visualized. Tricuspid valve regurgitation is not demonstrated. No evidence of tricuspid stenosis. Aortic Valve: The aortic valve is tricuspid. Aortic valve regurgitation is not visualized. No aortic stenosis is present. Aortic valve mean gradient measures 4.0 mmHg. Aortic valve peak gradient measures 6.9 mmHg. Aortic valve area, by VTI measures 2.59 cm. Pulmonic Valve: The pulmonic valve was not well visualized. Pulmonic valve regurgitation is not visualized. No evidence of pulmonic stenosis. Aorta: The aortic root and ascending aorta are structurally normal, with no evidence of dilitation. Venous: Patient on Vent. IAS/Shunts: No atrial level shunt detected by color flow Doppler.  LEFT VENTRICLE PLAX 2D LVIDd:         5.10 cm LVIDs:         3.20 cm LV PW:         1.30 cm LV IVS:        1.50 cm LVOT diam:     2.00 cm LV SV:         53 LV SV Index:   22 LVOT Area:     3.14 cm  RIGHT VENTRICLE             IVC RV S prime:     13.50 cm/s  IVC diam: 2.30 cm LEFT ATRIUM             Index        RIGHT ATRIUM           Index LA diam:        4.10 cm 1.69 cm/m   RA Area:     18.70 cm LA Vol (A2C):   38.4 ml 15.87 ml/m  RA Volume:   51.80 ml  21.41 ml/m LA Vol (A4C):   63.7 ml 26.32 ml/m LA Biplane Vol: 54.0 ml 22.32 ml/m  AORTIC VALVE AV Area (Vmax):  2.97 cm AV Area (Vmean):   2.69 cm AV Area (VTI):     2.59 cm AV Vmax:           131.00 cm/s AV Vmean:          87.700 cm/s AV VTI:            0.206 m AV Peak Grad:      6.9 mmHg AV Mean Grad:      4.0 mmHg LVOT Vmax:         124.00 cm/s LVOT Vmean:        75.000 cm/s LVOT VTI:          0.170 m LVOT/AV VTI ratio: 0.83  AORTA Ao Root diam: 3.10 cm Ao Asc diam:  3.50 cm  SHUNTS Systemic VTI:  0.17 m Systemic Diam: 2.00 cm Vishnu Priya Mallipeddi Electronically signed by Lorelee Cover Mallipeddi Signature Date/Time: 07/26/2022/3:58:31 PM    Final    DG CHEST PORT 1 VIEW  Result Date: 07/26/2022 CLINICAL DATA:  Encounter for central line placement. EXAM:  PORTABLE CHEST 1 VIEW COMPARISON:  07/26/2022 FINDINGS: Interval placement of left IJ catheter with tip in the projection of the superior cavoatrial junction. No pneumothorax identified. ET tube tip is above the carina. There is an enteric tube with tip and side port below the GE junction. Stable cardiomediastinal contours. Diffuse pulmonary vascular congestion appears unchanged. Blunting of the left costophrenic angle is noted, similar to previous exam and may reflect artifact from overlying external device. IMPRESSION: 1. Interval placement of left IJ catheter with tip in the projection of the superior cavoatrial junction. No pneumothorax. 2. Stable pulmonary vascular congestion. Electronically Signed   By: Kerby Moors M.D.   On: 07/26/2022 11:38   US RENAL  Result Date: 07/26/2022 CLINICAL DATA:  71 year old male with history of acute kidney injury. EXAM: RENAL / URINARY TRACT ULTRASOUND COMPLETE COMPARISON:  No priors. FINDINGS: Right Kidney: Renal measurements: 12.1 x 6.7 x 6.1 cm = volume: 256.8 mL. Echogenicity within normal limits. No mass or hydronephrosis visualized. Trace amount of right perinephric fluid. Left Kidney: Renal measurements: 12.3 x 5.5 x 6.6 cm = volume: 234.7 mL. Echogenicity within normal limits. No mass or hydronephrosis visualized. Bladder: Not visualized. Other: None. IMPRESSION: 1. Trace amount of perinephric fluid adjacent to the right kidney. Sonographic appearance of the kidneys is otherwise normal bilaterally. Specifically, no hydronephrosis. 2. Limited study which was unable to visualize the urinary bladder. Electronically Signed   By: Vinnie Langton M.D.   On: 07/26/2022 10:19   DG CHEST PORT 1 VIEW  Result Date: 07/26/2022 CLINICAL DATA:  Respirator dependent EXAM: PORTABLE CHEST 1 VIEW COMPARISON:  Earlier today at 12:08 a.m. FINDINGS: 5:01 a.m. Endotracheal tube terminates 5.6 cm above carina. Apical lordotic positioning. External pacer/defibrillator projects  over the left side of the chest. Midline trachea. Mild cardiomegaly. No pleural effusion or pneumothorax. Minimal worsening in interstitial prominence and indistinctness. Persistent left base atelectasis. IMPRESSION: 1. Appropriate position of endotracheal tube. 2. Slight worsening in aeration, likely worsening interstitial edema. 3. Similar left base atelectasis. Electronically Signed   By: Abigail Miyamoto M.D.   On: 07/26/2022 08:57   DG CHEST PORT 1 VIEW  Result Date: 07/26/2022 CLINICAL DATA:  Intubation. EXAM: PORTABLE CHEST 1 VIEW COMPARISON:  Chest radiograph dated 07/25/2022. FINDINGS: Endotracheal tube with tip approximately 4.5 cm above the carina. Slight improvement in bilateral reticulonodular densities. No large pleural effusion. No pneumothorax. Stable cardiomediastinal silhouette. No acute osseous pathology. IMPRESSION:  1. Endotracheal tube above the carina. 2. Slight improvement in bilateral reticulonodular densities. Electronically Signed   By: Anner Crete M.D.   On: 07/26/2022 00:08   DG Chest Portable 1 View  Result Date: 07/25/2022 CLINICAL DATA:  Intubation EXAM: PORTABLE CHEST 1 VIEW COMPARISON:  03/12/2022 FINDINGS: Cardiomegaly. Diffuse bilateral interstitial pulmonary opacity. Endotracheal tube positioned with tip below the thoracic inlet. Osseous structures unremarkable. IMPRESSION: 1. Cardiomegaly with diffuse bilateral interstitial pulmonary opacity, likely edema. 2. Endotracheal tube positioned with tip below the thoracic inlet. Electronically Signed   By: Delanna Ahmadi M.D.   On: 07/25/2022 17:58     Assessment and Plan:   Elevated troponin CAD s/p DES x3 S/P respiratory driven cardiac arrest  Patient with troponin trend of 18, 26, 340, 155 following a presumed hypoxia driven cardiac arrest. TTE from 11/12 shows LVEF 40-45%, reported apex akinesis. Anterior wall hypokinesis also reported as new. ECGs without ischemic changes.  Suspect demand ischemia and a  component of post-arrest myocardial dysfunction. Given downtrending troponin, no ischemic ECG changes, and no chest pain, would not pursue LHC at this time.  Recommend ASA '81mg'$  daily Continue CAD GMDT including high intensity statin and daily Metoprolol Succinate.  Chronic systolic/diastolic CHF  Patient with history of decreased LVEF following NSTEMI in 2021. Subsequently had recovery to normal LVEF, now decreased to 40-45% following hypoxia driven cardiac arrest. Patient appears euvolemic on exam.  Resume HF GDMT including Metoprolol as allowed by BP. Resume Spironolactone once renal functional normalizes. Avoid ACEi/ARBs due to angioedema which at this time is presumed due to lisinopril.  Agree with IV lasix.    Hypertension  Patient initially requiring vasopressors. Now off, resume home regimen as able.    Acute on chronic hypoxemic respiratory failure COPD (Home O2 3LPM)  Patient extubated this morning, currently on BiPAP due to respiratory distress post-extubation.   Acute kidney injury  Patient with post arrest AKI, creatinine elevated to 1.88 (baseline around 1.1). Improved to 1.27 today.       For questions or updates, please contact [place] Please consult [email or web address] for contact info under    Signed, Lily Kocher, PA-C  07/28/2022 10:32 AM  Patient seen, examined. Available data reviewed. Agree with findings, assessment, and plan as outlined by Lily Kocher, PA-C.  The patient is independently interviewed and examined.  He is an obese male on oxygen per nasal cannula.  He is somewhat lethargic on a Precedex drip, but able to awaken and respond to questions appropriately.  HEENT is normal, neck is thick unable to assess JVP, lungs are clear bilaterally, heart is regular rate and rhythm with no murmur or gallop, abdomen is soft, obese, nontender.  Extremities have no edema, skin is warm and dry with no rash.  I agree with the findings as outlined above.   The patient presents with angioedema followed by difficult intubation, attempt at emergent trach, and reintubation.  He had a small troponin elevation with peak high-sensitivity troponin only 340.  The patient has had no chest pain.  I reviewed his echo images and compared them to his previous echo.  He has very difficult acoustic windows because of his morbid obesity.  Even with contrast imaging it is difficult to visualize his endocardial borders, but he does have a distal anterior and apical wall motion abnormality.  This has been present on past studies.  I do not see any clinical, lab, or echo signs that suggest ACS.  Would continue with medical therapy as  outlined.  No further cardiac evaluation indicated during this hospitalization.  Sherren Mocha, M.D. 07/28/2022 5:49 PM

## 2022-07-28 NOTE — Procedures (Signed)
Extubation Procedure Note  Patient Details:   Name: Gregory Alvarado DOB: Oct 27, 1950 MRN: [id number]   Airway Documentation:    Vent end date: 07/28/22 Vent end time: 0911   Evaluation  O2 sats: transiently fell during during procedure Complications: No apparent complications Patient did tolerate procedure well. Bilateral Breath Sounds: Diminished   Yes  Positive cuff leak noted. Emergency re-intubation equipment at bedside with 3 MDs. Patient extubated to  6L with humidity, no stridor noted. Due to increased HR and WOB, patient placed on Bipap 13/8.  Patient vitals improved, patient tolerating well at this time.  [name] 07/28/2022, 10:27 AM

## 2022-07-28 NOTE — Progress Notes (Signed)
Patient with coughing episode ETT noted to be further out then before. ETT tube at 22cm instead of previously noted 25cm. RT called to bedside and Elink notified, tube advanced by RT and STAT CXR ordered. CXR reviewed by Elink MD and no interventions needed at this time.

## 2022-07-28 NOTE — Progress Notes (Signed)
Patient taken off Bipap and placed on [place] 6L with humidity, tolerating well at this time.

## 2022-07-28 NOTE — Progress Notes (Signed)
eLink Physician-Brief Progress Note Patient Name: Gregory Alvarado DOB: [birth year] MRN: [id number]   Date of Service  07/28/2022  HPI/Events of Note  CXR reviewed.   eICU Interventions  NO intervention.        Kerry Kass Ahmar Pickrell 07/28/2022, 4:10 AM

## 2022-07-29 DIAGNOSIS — I249 Acute ischemic heart disease, unspecified: Secondary | ICD-10-CM

## 2022-07-29 DIAGNOSIS — T783XXA Angioneurotic edema, initial encounter: Secondary | ICD-10-CM | POA: Diagnosis not present

## 2022-07-29 DIAGNOSIS — J9601 Acute respiratory failure with hypoxia: Secondary | ICD-10-CM | POA: Diagnosis not present

## 2022-07-29 DIAGNOSIS — T464X5A Adverse effect of angiotensin-converting-enzyme inhibitors, initial encounter: Secondary | ICD-10-CM

## 2022-07-29 LAB — CBC WITH DIFFERENTIAL/PLATELET
Abs Immature Granulocytes: 0.12 10*3/uL — ABNORMAL HIGH (ref 0.00–0.07)
Basophils Absolute: 0.1 10*3/uL (ref 0.0–0.1)
Basophils Relative: 0 %
Eosinophils Absolute: 0.2 10*3/uL (ref 0.0–0.5)
Eosinophils Relative: 2 %
HCT: 28.1 % — ABNORMAL LOW (ref 39.0–52.0)
Hemoglobin: 8.5 g/dL — ABNORMAL LOW (ref 13.0–17.0)
Immature Granulocytes: 1 %
Lymphocytes Relative: 7 %
Lymphs Abs: 1 10*3/uL (ref 0.7–4.0)
MCH: 23.6 pg — ABNORMAL LOW (ref 26.0–34.0)
MCHC: 30.2 g/dL (ref 30.0–36.0)
MCV: 78.1 fL — ABNORMAL LOW (ref 80.0–100.0)
Monocytes Absolute: 1 10*3/uL (ref 0.1–1.0)
Monocytes Relative: 7 %
Neutro Abs: 11.2 10*3/uL — ABNORMAL HIGH (ref 1.7–7.7)
Neutrophils Relative %: 83 %
Platelets: 179 10*3/uL (ref 150–400)
RBC: 3.6 MIL/uL — ABNORMAL LOW (ref 4.22–5.81)
RDW: 17.2 % — ABNORMAL HIGH (ref 11.5–15.5)
WBC: 13.6 10*3/uL — ABNORMAL HIGH (ref 4.0–10.5)
nRBC: 0 % (ref 0.0–0.2)

## 2022-07-29 LAB — BASIC METABOLIC PANEL
Anion gap: 10 (ref 5–15)
BUN: 37 mg/dL — ABNORMAL HIGH (ref 8–23)
CO2: 30 mmol/L (ref 22–32)
Calcium: 9.2 mg/dL (ref 8.9–10.3)
Chloride: 96 mmol/L — ABNORMAL LOW (ref 98–111)
Creatinine, Ser: 1.29 mg/dL — ABNORMAL HIGH (ref 0.61–1.24)
GFR, Estimated: >60 mL/min (ref >60)
Glucose, Bld: 165 mg/dL — ABNORMAL HIGH (ref 70–99)
Potassium: 4.2 mmol/L (ref 3.5–5.1)
Sodium: 136 mmol/L (ref 135–145)

## 2022-07-29 LAB — GLUCOSE, CAPILLARY
Glucose-Capillary: 157 mg/dL — ABNORMAL HIGH (ref 70–99)
Glucose-Capillary: 148 mg/dL — ABNORMAL HIGH (ref 70–99)
Glucose-Capillary: 164 mg/dL — ABNORMAL HIGH (ref 70–99)
Glucose-Capillary: 145 mg/dL — ABNORMAL HIGH (ref 70–99)
Glucose-Capillary: 147 mg/dL — ABNORMAL HIGH (ref 70–99)

## 2022-07-29 LAB — PHOSPHORUS: Phosphorus: 3.5 mg/dL (ref 2.5–4.6)

## 2022-07-29 LAB — MAGNESIUM: Magnesium: 2 mg/dL (ref 1.7–2.4)

## 2022-07-29 MED ORDER — PHENOL 1.4 % MT LIQD: 1.0000 | OROMUCOSAL | Status: DC | PRN

## 2022-07-29 MED ORDER — EMPAGLIFLOZIN 10 MG PO TABS
10.0000 mg | ORAL_TABLET | Freq: Every day | ORAL | Status: DC
  Administered 2022-07-29 – 2022-07-31 (×3): 10 mg via ORAL
  Filled 2022-07-29 (×3): qty 1

## 2022-07-29 MED ORDER — DIPHENHYDRAMINE HCL 25 MG PO CAPS
25.0000 mg | ORAL_CAPSULE | Freq: Once | ORAL | Status: AC
  Administered 2022-07-29: 25 mg via ORAL
  Filled 2022-07-29: qty 1

## 2022-07-29 MED ORDER — ZOLPIDEM TARTRATE 5 MG PO TABS
5.0000 mg | ORAL_TABLET | Freq: Every evening | ORAL | Status: DC | PRN
  Administered 2022-07-30 (×2): 5 mg via ORAL
  Filled 2022-07-29 (×2): qty 1

## 2022-07-29 MED ORDER — INSULIN ASPART 100 UNIT/ML IJ SOLN: 0.0000 [IU] | Freq: Three times a day (TID) | INTRAMUSCULAR | Status: DC

## 2022-07-29 MED ORDER — INSULIN ASPART 100 UNIT/ML IJ SOLN
0.0000 [IU] | Freq: Three times a day (TID) | INTRAMUSCULAR | Status: DC
  Administered 2022-07-29 – 2022-07-30 (×3): 2 [IU] via SUBCUTANEOUS

## 2022-07-29 MED ORDER — INSULIN ASPART 100 UNIT/ML IJ SOLN: 0.0000 [IU] | Freq: Every day | INTRAMUSCULAR | Status: DC

## 2022-07-29 MED ORDER — INSULIN NPH (HUMAN) (ISOPHANE) 100 UNIT/ML ~~LOC~~ SUSP
20.0000 [IU] | Freq: Two times a day (BID) | SUBCUTANEOUS | Status: DC
  Administered 2022-07-29 – 2022-07-30 (×2): 20 [IU] via SUBCUTANEOUS
  Filled 2022-07-29 (×2): qty 10

## 2022-07-29 MED ORDER — HYDROCORTISONE (PERIANAL) 2.5 % EX CREA
TOPICAL_CREAM | Freq: Two times a day (BID) | CUTANEOUS | Status: DC
  Filled 2022-07-29: qty 28.35

## 2022-07-29 NOTE — Progress Notes (Addendum)
Rounding Note    Patient Name: Gregory Alvarado Date of Encounter: 07/29/2022  [place] Cardiologist: Carlyle Dolly, MD   Subjective   No acute events overnight.  Weaned to N/C.  Breathing is better.  Denies angina now or prior to admission  Inpatient Medications    Scheduled Meds:  Chlorhexidine Gluconate Cloth  6 each Topical Q0600   feeding supplement (PROSource TF20)  60 mL Per Tube BID   heparin  5,000 Units Subcutaneous Q8H   insulin aspart  0-20 Units Subcutaneous Q4H   insulin NPH Human  20 Units Subcutaneous BID AC & HS   lidocaine (PF)  5 mL Intradermal Once   metoprolol succinate  150 mg Oral Daily   Continuous Infusions:  sodium chloride Stopped (07/28/22 1405)   feeding supplement (VITAL HIGH PROTEIN) Stopped (07/28/22 0900)   PRN Meds: albuterol, dextrose, melatonin, mouth rinse   Vital Signs    Vitals:   07/29/22 0600 07/29/22 0615 07/29/22 0735 07/29/22 1000  BP: (!) 120/104   127/75  Pulse: 81 80  74  Resp: 15 (!) 23  12  Temp:   98.2 F (36.8 C)   TempSrc:      SpO2: 100% 100%  100%  Weight:      Height:        Intake/Output Summary (Last 24 hours) at 07/29/2022 1021 Last data filed at 07/29/2022 0600 Gross per 24 hour  Intake 517.02 ml  Output 4950 ml  Net -4432.98 ml      07/29/2022    3:52 AM 07/28/2022    5:00 AM 07/27/2022    5:00 AM  Last 3 Weights  Weight (lbs) 292 lb 8.8 oz 295 lb 10.2 oz 291 lb 3.6 oz  Weight (kg) 132.7 kg 134.1 kg 132.1 kg      Telemetry    SR with occ PVCs- Personally Reviewed  ECG    SR with occ PVCs - Personally Reviewed  Physical Exam   GEN: No acute distress.   Neck: No JVD Cardiac: RRR, no murmurs, rubs, or gallops.  Respiratory: Coarse bilaterally GI: Soft, nontender, non-distended  MS: No edema; No deformity. Neuro:  Nonfocal  Psych: Normal affect   Labs    High Sensitivity Troponin:   Recent Labs  Lab 07/25/22 1605 07/25/22 1944 07/26/22 0410 07/27/22 0912   TROPONINIHS 18* 26* 340* 155*     Chemistry Recent Labs  Lab 07/25/22 1605 07/26/22 0007 07/26/22 0410 07/26/22 2041 07/27/22 0400 07/28/22 0426 07/29/22 0350  NA 137   < > 134* 135 133* 138 136  K 5.3*   < > 4.7 4.6 4.5 4.1 4.2  CL 100  --  98 101 99 104 96*  CO2 31  --  21* '27 25 26 30  '$ GLUCOSE 215*  --  509* 156* 185* 164* 165*  BUN 26*  --  33* 38* 38* 44* 37*  CREATININE 1.24  --  1.88* 1.42* 1.39* 1.27* 1.29*  CALCIUM 9.6  --  9.0 9.1 8.7* 9.0 9.2  MG  --   --  1.8 2.2  --   --  2.0  PROT 7.4  --  6.5  --   --   --   --   ALBUMIN 3.6  --  3.1*  --   --   --   --   AST 12*  --  22  --   --   --   --   ALT 9  --  12  --   --   --   --   ALKPHOS 91  --  83  --   --   --   --   BILITOT 0.3  --  0.2*  --   --   --   --   GFRNONAA >60  --  38* 53* 55* >60 >60  ANIONGAP 6  --  '15 7 9 8 10   '$ < > = values in this interval not displayed.    Lipids  Recent Labs  Lab 07/26/22 0410  TRIG 219*    Hematology Recent Labs  Lab 07/26/22 0410 07/28/22 0426 07/29/22 0350  WBC 36.6* 14.1* 13.6*  RBC 4.28 3.35* 3.60*  HGB 10.0* 7.7* 8.5*  HCT 34.2* 26.6* 28.1*  MCV 79.9* 79.4* 78.1*  MCH 23.4* 23.0* 23.6*  MCHC 29.2* 28.9* 30.2  RDW 17.1* 17.5* 17.2*  PLT 294 185 179   Thyroid No results for input(s): "TSH", "FREET4" in the last 168 hours.  BNP Recent Labs  Lab 07/25/22 1605  BNP 31.0    DDimer No results for input(s): "DDIMER" in the last 168 hours.   Radiology    DG CHEST PORT 1 VIEW  Result Date: 07/28/2022 CLINICAL DATA:  Respiratory failure EXAM: PORTABLE CHEST 1 VIEW COMPARISON:  07/26/2022 FINDINGS: Endotracheal tube seen 8.1 cm above the carina, at the level of the clavicular heads. The retaining cuff appears hyperinflated expands the walls of the airway. Nasogastric tube extends into the upper abdomen beyond the margin of the examination. Left internal jugular central venous catheter tip noted within the superior vena cava, unchanged. Opacity at the left  lung base may relate to overlying soft tissue. Lungs are otherwise clear. No pneumothorax or pleural effusion. Cardiac size is within normal limits. Pulmonary vascularity is normal. No acute bone abnormality. IMPRESSION: 1. Endotracheal tube 8.1 cm above the carina. The retaining cuff appears hyperinflated expands the walls of the airway. Electronically Signed   By: Fidela Salisbury M.D.   On: 07/28/2022 03:20    Cardiac Studies   TTE 11/23   1. Left ventricular ejection fraction, by estimation, is 40 to 45%. The  left ventricle has mildly decreased function. The left ventricle  demonstrates regional wall motion abnormalities (see scoring  diagram/findings for description). There is moderate  left ventricular hypertrophy. Left ventricular diastolic function could  not be evaluated.   2. Right ventricular systolic function is mildly reduced. The right  ventricular size is normal.   3. The mitral valve is grossly normal. No evidence of mitral valve  regurgitation. No evidence of mitral stenosis.   4. The aortic valve is tricuspid. Aortic valve regurgitation is not  visualized. No aortic stenosis is present.     Assessment & Plan     ACS:  Type 2 MI due to respiratory failure.  TTE unchanged versus prior with chronic WMA.  Cont ASA and BB.  No further cardiac evaluation required at this time.  Will sign off, call with ?s.   2.  Chronic systolic/diastolic CHF:  Mild CM with WMA.  Unchanged versus prior.  Cont Toprol.  No ACEi/ARB.  Start jardiance 10 qday.  EF >35% so would defer spironolactone for now.  Cont lasix, transition to PO; had brisk diuresis.       3.  Hypertension:  BP controlled.  4.  Respiratory failure:  Due to angioedema from ACEi; per critical care.   5.  Acute kidney injury: Resolved.  [place] will sign off.   Medication Recommendations:  Jardiance Other recommendations (labs, testing, etc):  None Follow up as an outpatient:  Has cardiology f/u  scheduled 09/11/22  For questions or updates, please contact [place] Please consult [email or web address] for contact info under        Signed, Early Osmond, MD  07/29/2022, 10:21 AM

## 2022-07-29 NOTE — Progress Notes (Signed)
NAME:  Gregory Alvarado, MRN:  [id number], DOB:  Feb 09, 1951, LOS: 4 ADMISSION DATE:  07/25/2022, CONSULTATION DATE:  11/11 REFERRING MD:  Roderic Palau, CHIEF COMPLAINT:  angioedema   History of Present Illness:  Patient is encephalopathic and/or intubated. Therefore history has been obtained from chart review.   Gregory Alvarado, is a 71 y.o. 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  Reports of patient taking lisinopril.  He developed worsening hypoxia in the [facility]. Intubation was attempted and failed, emergent tracheostomy was attempted without success.  The patient was intubated.  After intubation patient bradycardia down to 39, pulse was loss, around 1 minute of CPR with ROSC.  PCCM was consulted for transfer to [facility] and admission.  Pertinent  Medical History   COPD, chronic respiratory failure on 3L, systolic and diastolic CHF, CAD with DES (12/21), GIB, DM II, HLD, tobacco use   Significant Hospital Events: Including procedures, antibiotic start and stop dates in addition to other pertinent events   11/11 presented to [facility] ED, failed intubation and failed tracheostomy, 1 minute cardiac arrest with ROSC, intubated, PCCM consult   Interim History / Subjective:  No overnight issues. Refused bipap. On 6LNC. No stridor or respiratory distress. Requesting diet.   Objective   Blood pressure (!) 120/104, pulse 80, temperature 98.2 F (36.8 C), resp. rate (!) 23, height '5\' 9"'$  (1.753 m), weight 132.7 kg, SpO2 100 %.    Vent Mode: BIPAP;PCV FiO2 (%):  [40 %] 40 % Set Rate:  [15 bmp] 15 bmp PEEP:  [8 cmH20] 8 cmH20 Pressure Support:  [5 cmH20] 5 cmH20   Intake/Output Summary (Last 24 hours)  at 07/29/2022 0817 Last data filed at 07/29/2022 0600 Gross per 24 hour  Intake 721.02 ml  Output 5650 ml  Net -4928.98 ml   Filed Weights   07/27/22 0500 07/28/22 0500 07/29/22 0352  Weight: 132.1 kg 134.1 kg 132.7 kg    Examination: Sitting up in bed, no distress Coughing, lungs diminished but no increased work of breathing, no wheezes RRR No edema   WBC 13.6 Hgb 8.5    Resolved Hospital Problem list   Shock  Assessment & Plan:  Acute on chronic hypoxemic respiratory failure COPD on home oxygen 3LNC ACEi angioedema - 6.0 ETT placed here in ED - Completing 3 days ceftriaxone for aspiration - no further stridor or laryngeal edema - ok for diet today  Respiratory associated cardiac arrest Elevated Troponin, likely demand ischemia, hx CAD - appears neuro intact after arrest - echo reviewed shows LVEF reduced at 45% with akinetic apex. This appears to be a new WMA - appreciate cardiology involvement - outpatinet follow up and GDMT as tolerated recommended  DM2 with hyperglycemia - add back half dose NPH 20 from home dose.  - continue SSI - advancing diet, may need more control.   AKI - improving - uop with tremendous response to lasix x1  HTN - resumed metoprolol - monitor Bps prior to discharge - has amlodipine listed as allergy and obviously cannot have ace/arb given angioedema.   Best Practice (right click and "Reselect all SmartList Selections" daily)   Diet/type: advancing diet to carb controlled.  DVT  prophylaxis: prophylactic heparin  GI prophylaxis: H2B Lines: central line can be removed today Foley: none Code Status:  full code Last date of multidisciplinary goals of care discussion [son updated over the phone 11/13 and is planning to come up today]  I spent 35 minutes in total visit time for this patient, with more than 50% spent counseling/coordinating care.  Will signout to [facility] to assume care 11/16 and transfer to [facility] med/tele. PCCM will see  as needed.   Lenice Llamas, MD Pulmonary and [place] 07/29/2022 8:27 AM Pager: see AMION  If no response to pager, please call critical care on call (see AMION) until 7pm After 7:00 pm call Elink

## 2022-07-29 NOTE — Progress Notes (Signed)
Patient transferred to 0W38 without complications. Handed over to RN.

## 2022-07-29 NOTE — Progress Notes (Signed)
[facility] Progress Note Patient Name: Gregory Alvarado DOB: 1950-10-07 MRN: [id number]   Date of Service  07/29/2022  HPI/Events of Note  Patient requesting a sllep aid, states that Melatonin did not work for him, asking for Tylenol PM which he states is what he takes at home for sleep.  eICU Interventions  Benadryl 25 mg po x 1 ordered.        Kerry Kass Der Gagliano 07/29/2022, 12:33 AM

## 2022-07-29 NOTE — Progress Notes (Signed)
Nutrition Follow-up  DOCUMENTATION CODES:  Obesity unspecified  INTERVENTION:  Continue current diet as ordered Encourage PO intake MVI with minerals daily  NUTRITION DIAGNOSIS:  Inadequate oral intake related to inability to eat as evidenced by NPO status. - remains applicable  GOAL:  Patient will meet greater than or equal to 90% of their needs - progressing, diet advanced  MONITOR:  PO intake, Supplement acceptance, Weight trends, I & O's, Labs  REASON FOR ASSESSMENT:  Ventilator, Consult Enteral/tube feeding initiation and management  ASSESSMENT:  71 year old male who presented to the ED on 11/11 with severe angioedema. Emergent tracheostomy was attempted without success. Pt was eventually intubated and had a short cardiac arrest in ED. 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  11/11 - intubated after loss of pulse (~1 minute of CPR) 11/14 - extubated  Pt able to be extubated and diet order now in place. Discussed in rounds, pt stable to be transferred out of the ICU today. Will discontinue TF order set as tube is no longer in place. No supplements in place at this time, will monitor intake trends to determine if additional interventions are needed to meet nutrition needs.    Intake/Output Summary (Last 24 hours) at 07/29/2022 1201 Last data filed at 07/29/2022 0600 Gross per 24 hour  Intake 424.03 ml  Output 3700 ml  Net -3275.97 ml  Net IO Since Admission: -769.62 mL [07/29/22 1201]  Nutritionally Relevant Medications: Scheduled Meds:  famotidine  20 mg Oral BID   feeding supplement (PROSource TF20)  60 mL Per Tube BID   insulin aspart  0-20 Units Subcutaneous Q4H   insulin NPH Human  20 Units Subcutaneous BID AC & HS   Continuous Infusions:  feeding supplement (VITAL HIGH PROTEIN) Stopped (07/28/22 0900)   PRN Meds: dextrose  Labs Reviewed: BUN 37, creatinine  1.29 Chloride 96 CBG ranges from 129-164 mg/dL over the last 24 hours HgbA1c 7.7%  NUTRITION - FOCUSED PHYSICAL EXAM: Flowsheet Row Most Recent Value  Orbital Region No depletion  Upper Arm Region No depletion  Thoracic and Lumbar Region No depletion  Buccal Region Unable to assess  Temple Region No depletion  Clavicle Bone Region No depletion  Clavicle and Acromion Bone Region No depletion  Scapular Bone Region No depletion  Dorsal Hand No depletion  Patellar Region No depletion  Anterior Thigh Region No depletion  Posterior Calf Region No depletion  Edema (RD Assessment) Mild  [BLE]  Hair Reviewed  Eyes Reviewed  Mouth Reviewed  Skin Reviewed  Nails Reviewed   Diet Order:   Diet Order             Diet heart healthy/carb modified Room service appropriate? Yes with Assist; Fluid consistency: Thin  Diet effective now                   EDUCATION NEEDS:  No education needs have been identified at this time  Skin:  Skin Assessment: Reviewed RN Assessment (closed incision to neck)  Last BM:  11/14 - type 6  Height:  Ht Readings from Last 1 Encounters:  07/26/22 '5\' 9"'$  (1.753 m)    Weight:  Wt Readings from Last 1 Encounters:  07/29/22 132.7 kg    Ideal Body Weight:  72.7 kg  BMI:  Body mass index is 43.2 kg/m.  Estimated Nutritional Needs:  Kcal:  2000-2200 kcal/d Protein:  100-120g/d Fluid:  2L/d  Ranell Patrick, RD, LDN Clinical Dietitian RD pager # available in [place]  After hours/weekend pager # available in [facility]

## 2022-07-29 NOTE — Progress Notes (Signed)
Inpatient Diabetes Program Recommendations  AACE/ADA: New Consensus Statement on Inpatient Glycemic Control (2015)  Target Ranges:  Prepandial:   less than 140 mg/dL      Peak postprandial:   less than 180 mg/dL (1-2 hours)      Critically ill patients:  140 - 180 mg/dL   Lab Results  Component Value Date   GLUCAP 148 (H) 07/29/2022   HGBA1C 7.7 (H) 07/26/2022    Review of Glycemic Control  Latest Reference Range & Units 07/28/22 18:15 07/28/22 19:37 07/28/22 23:21 07/29/22 03:32 07/29/22 07:34  Glucose-Capillary 70 - 99 mg/dL 159 (H) 129 (H) 153 (H) 157 (H) 148 (H)   Diabetes history: DM 2 Outpatient Diabetes medications:  Novolin 70/30 40 units bid Current orders for Inpatient glycemic control:  Novolog 0-20 units q 4 hours NPH 20 units bid Inpatient Diabetes Program Recommendations:    Consider reducing NPH to 12 units bid? Also may consider reducing Novolog correction to moderate correction tid with meals and HS scale? Will follow.  Thanks,  Adah Perl, RN, BC-ADM Inpatient Diabetes Coordinator Pager [phone]  (8a-5p)

## 2022-07-29 NOTE — Progress Notes (Signed)
Pt arrived to the unit A&Ox4 with VSS and no complaints. Pt is resting in bed at lowest position with call bell in reach.

## 2022-07-30 ENCOUNTER — Ambulatory Visit: Payer: Medicare Other | Admitting: Gastroenterology

## 2022-07-30 DIAGNOSIS — I249 Acute ischemic heart disease, unspecified: Secondary | ICD-10-CM

## 2022-07-30 DIAGNOSIS — T783XXD Angioneurotic edema, subsequent encounter: Secondary | ICD-10-CM

## 2022-07-30 DIAGNOSIS — J9620 Acute and chronic respiratory failure, unspecified whether with hypoxia or hypercapnia: Secondary | ICD-10-CM

## 2022-07-30 DIAGNOSIS — I2489 Other forms of acute ischemic heart disease: Secondary | ICD-10-CM

## 2022-07-30 LAB — CBC WITH DIFFERENTIAL/PLATELET
Abs Immature Granulocytes: 0.13 10*3/uL — ABNORMAL HIGH (ref 0.00–0.07)
Basophils Absolute: 0.1 10*3/uL (ref 0.0–0.1)
Basophils Relative: 1 %
Eosinophils Absolute: 0.3 10*3/uL (ref 0.0–0.5)
Eosinophils Relative: 2 %
HCT: 32.3 % — ABNORMAL LOW (ref 39.0–52.0)
Hemoglobin: 9.5 g/dL — ABNORMAL LOW (ref 13.0–17.0)
Immature Granulocytes: 1 %
Lymphocytes Relative: 7 %
Lymphs Abs: 0.9 10*3/uL (ref 0.7–4.0)
MCH: 23.1 pg — ABNORMAL LOW (ref 26.0–34.0)
MCHC: 29.4 g/dL — ABNORMAL LOW (ref 30.0–36.0)
MCV: 78.6 fL — ABNORMAL LOW (ref 80.0–100.0)
Monocytes Absolute: 1 10*3/uL (ref 0.1–1.0)
Monocytes Relative: 7 %
Neutro Abs: 10.7 10*3/uL — ABNORMAL HIGH (ref 1.7–7.7)
Neutrophils Relative %: 82 %
Platelets: 191 10*3/uL (ref 150–400)
RBC: 4.11 MIL/uL — ABNORMAL LOW (ref 4.22–5.81)
RDW: 17.2 % — ABNORMAL HIGH (ref 11.5–15.5)
WBC: 13.1 10*3/uL — ABNORMAL HIGH (ref 4.0–10.5)
nRBC: 0 % (ref 0.0–0.2)

## 2022-07-30 LAB — GLUCOSE, CAPILLARY
Glucose-Capillary: 144 mg/dL — ABNORMAL HIGH (ref 70–99)
Glucose-Capillary: 150 mg/dL — ABNORMAL HIGH (ref 70–99)
Glucose-Capillary: 113 mg/dL — ABNORMAL HIGH (ref 70–99)
Glucose-Capillary: 140 mg/dL — ABNORMAL HIGH (ref 70–99)

## 2022-07-30 LAB — BASIC METABOLIC PANEL
Anion gap: 10 (ref 5–15)
BUN: 35 mg/dL — ABNORMAL HIGH (ref 8–23)
CO2: 28 mmol/L (ref 22–32)
Calcium: 9.4 mg/dL (ref 8.9–10.3)
Chloride: 98 mmol/L (ref 98–111)
Creatinine, Ser: 1.3 mg/dL — ABNORMAL HIGH (ref 0.61–1.24)
GFR, Estimated: 59 mL/min — ABNORMAL LOW (ref >60)
Glucose, Bld: 197 mg/dL — ABNORMAL HIGH (ref 70–99)
Potassium: 4.5 mmol/L (ref 3.5–5.1)
Sodium: 136 mmol/L (ref 135–145)

## 2022-07-30 MED ORDER — LEVALBUTEROL HCL 0.63 MG/3ML IN NEBU
0.6300 mg | INHALATION_SOLUTION | Freq: Three times a day (TID) | RESPIRATORY_TRACT | Status: DC
  Administered 2022-07-30: 0.63 mg via RESPIRATORY_TRACT
  Filled 2022-07-30: qty 3

## 2022-07-30 MED ORDER — BACITRACIN ZINC 500 UNIT/GM EX OINT
TOPICAL_OINTMENT | Freq: Two times a day (BID) | CUTANEOUS | Status: DC
  Filled 2022-07-30 (×2): qty 28.4

## 2022-07-30 MED ORDER — INSULIN NPH (HUMAN) (ISOPHANE) 100 UNIT/ML ~~LOC~~ SUSP
12.0000 [IU] | Freq: Two times a day (BID) | SUBCUTANEOUS | Status: DC
  Administered 2022-07-30 – 2022-07-31 (×2): 12 [IU] via SUBCUTANEOUS
  Filled 2022-07-30: qty 10

## 2022-07-30 MED ORDER — GUAIFENESIN ER 600 MG PO TB12
1200.0000 mg | ORAL_TABLET | Freq: Two times a day (BID) | ORAL | Status: DC
  Administered 2022-07-30 – 2022-07-31 (×2): 1200 mg via ORAL
  Filled 2022-07-30 (×3): qty 2

## 2022-07-30 MED ORDER — IPRATROPIUM BROMIDE 0.02 % IN SOLN
0.5000 mg | Freq: Three times a day (TID) | RESPIRATORY_TRACT | Status: DC
  Administered 2022-07-30: 0.5 mg via RESPIRATORY_TRACT
  Filled 2022-07-30: qty 2.5

## 2022-07-30 MED FILL — Fentanyl Citrate Preservative Free (PF) Inj 100 MCG/2ML: INTRAMUSCULAR | Qty: 2 | Status: AC

## 2022-07-30 NOTE — TOC Initial Note (Addendum)
Transition of Care [facility]) - Initial/Assessment Note    Patient Details  Name: Gregory Alvarado MRN: [id number] Date of Birth: 1951/09/04  Transition of Care [facility]) CM/SW Contact:    Zenon Mayo, RN Phone Number: 07/30/2022, 3:00 PM  Clinical Narrative:                 From home with his son and his son 34 wife, patient has home oxygen 2 liters with [place], he also has a walker that he does not use.  He states a friend of the family , Heloise Purpura will transport him home in the am.  NCM asked if he will be bringing his oxygen tank with him to transport him home with he states he is not sure.  NCM offered choice for HHPT with Medicare. Gov list , he states he has no preference.  NCM made referral to Levada Dy with [facility]).  Awaiting to hear back from [place]. NCM contacted [facility] to see if they can bring a oxygen tank for patient to go home with tomorrow. NCM spoke with supervisor, Maudie Mercury at [facility] , they will bring a oxygen tank in am by 10:30 for patient to go home with. Per Levada Dy with [facility] can not take referral.  NCM made referral to [facility] with [facility] , he is able to take referral , soc will begin on Monday.    Expected Discharge Plan: [place] Barriers to Discharge: Continued Medical Work up   Patient Goals and CMS Choice   CMS Medicare.gov Compare Post Acute Care list provided to:: Patient Choice offered to / list presented to : Patient  Expected Discharge Plan and Services Expected Discharge Plan: [place] In-house Referral: NA Discharge Planning Services: CM Consult Post Acute Care Choice: [place] arrangements for the past 2 months: Single Family Home                 DME Arranged: N/A         HH Arranged: [facility] [place] Date HH Agency Contacted: 07/30/22 Time HH Agency Contacted: [id number] Representative spoke with at [facility]  Prior Living  Arrangements/Services Living arrangements for the past 2 months: [place] Lives with:: Adult Children Patient language and need for interpreter reviewed:: Yes Do you feel safe going back to the place where you live?: Yes      Need for Family Participation in Patient Care: Yes (Comment) Care giver support system in place?: Yes (comment) Current home services: DME (walker, home oxygen with [place]) Criminal Activity/Legal Involvement Pertinent to Current Situation/Hospitalization: No - Comment as needed  Activities of Daily Living      Permission Sought/Granted                  Emotional Assessment Appearance:: Appears stated age Attitude/Demeanor/Rapport: Engaged Affect (typically observed): Appropriate Orientation: : Oriented to Self, Oriented to Place, Oriented to  Time, Oriented to Situation Alcohol / Substance Use: Not Applicable Psych Involvement: No (comment)  Admission diagnosis:  Respiratory failure ([place]) [J96.90] Acute respiratory failure with hypoxia ([place]) [J96.01] Patient Active Problem List   Diagnosis Date Noted   ACS (acute coronary syndrome) ([place]) 07/29/2022   Demand ischemia of myocardium 07/28/2022   Acute respiratory failure ([place]) 07/25/2022   Respiratory failure ([place]) 07/25/2022   Shock ([place]) 07/25/2022   Angio-edema 07/25/2022   Chronic respiratory failure with hypoxia and hypercapnia ([place]) 03/14/2022   Acute blood  loss anemia 03/14/2022   Coronary artery disease 03/14/2022   AVM (arteriovenous malformation) of small bowel, acquired with hemorrhage    Acute metabolic encephalopathy 82/99/3716   Syncope 03/12/2022   COPD (chronic obstructive pulmonary disease) ([place]) 03/12/2022   Type 2 diabetes mellitus with hyperglycemia ([place]) 11/24/2021   Elevated brain natriuretic peptide (BNP) level 11/24/2021   Acute on chronic respiratory failure with hypoxia and hypercapnia (HCC) 08/28/2021   Acute on chronic combined systolic and  diastolic CHF (congestive heart failure) ([place]) 08/28/2021   Acute anemia 01/06/2021   SBO (small bowel obstruction) (HCC)    Guaiac positive stools 07/22/2020   Folate deficiency 07/22/2020   History of colonic polyps 07/16/2020   Chronic diastolic CHF (congestive heart failure) ([place]) 07/09/2020   Exertional angina 07/09/2020   CRI (chronic renal insufficiency), stage 2 (mild) 07/09/2020   Colon cancer screening 01/23/2020   PUD (peptic ulcer disease) 01/23/2020   Esophageal dysphagia 01/23/2020   H. pylori infection 01/23/2020   GI bleed 10/27/2019   Symptomatic anemia 10/26/2019   Obesity, Class III, BMI 40-49.9 (morbid obesity) ([place]) 10/26/2019   Iron deficiency anemia    Melena    Constipation    Personal history of noncompliance with medical treatment, presenting hazards to health 02/24/2017   Class 2 obesity due to excess calories with body mass index (BMI) of 38.0 to 38.9 in adult 01/05/2017   Current smoker 01/05/2017   Essential hypertension    Uncontrolled type 2 diabetes mellitus with hyperglycemia, with long-term current use of insulin ([place])    Arthritis    Hyperlipidemia    Insomnia    PCP:  Celene Squibb, MD Pharmacy:   [facility], [place] [address] Phone: [phone] Fax: [phone]     Social Determinants of Health (SDOH) Interventions    Readmission Risk Interventions    07/30/2022    2:56 PM 08/29/2021   12:56 PM  Readmission Risk Prevention Plan  Transportation Screening Complete Complete  PCP or Specialist Appt within 3-5 Days Complete   Home Care Screening  Complete  Medication Review (RN CM)  Complete  HRI or Home Care Consult Complete   Social Work Consult for [place] Planning/Counseling Complete   Palliative Care Screening Not Applicable   Medication Review Press photographer) Complete

## 2022-07-30 NOTE — Evaluation (Signed)
Physical Therapy Evaluation Patient Details Name: Gregory Alvarado MRN: [id number] DOB: [birth year] Today's Date: 07/30/2022  History of Present Illness  The pt is a 71 yo male presenting 11/11 with tongue swelling. Intubated upon arrival and pt with brief LOC requiring 1 min CPR prior to ROSC. PMH includes: COPD, chronic respiratory failure on 3L O2, CHF, CAD with DES 08/2020, GIB, DM II, HLD, and tobacco use.   Clinical Impression  Pt in bed upon arrival of PT, agreeable to evaluation at this time. Prior to admission the pt reports he was independent without use of DME, limited to short distance mobility and within-home distances. Describes very sedentary lifestyle with no consistent activity or exercise. The pt now presents with limitations in functional mobility, strength, power, and endurance, but is likely close to functional baseline. The pt requires increased time for all movements, and was dependent on use of RW for support with hallway ambulation, but reports this was much farther than he typically walks and that his gait is at his baseline. Pt educated on increased activity and exercise to improve functional strength and endurance, but is safe to return home with family support when medically cleared.         Recommendations for follow up therapy are one component of a multi-disciplinary discharge planning process, led by the attending physician.  Recommendations may be updated based on patient status, additional functional criteria and insurance authorization.  Follow Up Recommendations Home health PT      Assistance Recommended at Discharge Intermittent Supervision/Assistance  Patient can return home with the following  A little help with walking and/or transfers;Assistance with cooking/housework;Direct supervision/assist for medications management;Direct supervision/assist for financial management;Assist for transportation;Help with stairs or ramp for entrance    Equipment  Recommendations None recommended by PT  Recommendations for Other Services       Functional Status Assessment Patient has had a recent decline in their functional status and demonstrates the ability to make significant improvements in function in a reasonable and predictable amount of time.     Precautions / Restrictions Precautions Precautions: None Precaution Comments: on 3L O2 at baseline Restrictions Weight Bearing Restrictions: No      Mobility  Bed Mobility Overal bed mobility: Modified Independent             General bed mobility comments: increased time    Transfers Overall transfer level: Needs assistance Equipment used: Rolling walker (2 wheels) Transfers: Sit to/from Stand Sit to Stand: Min guard           General transfer comment: increased time and effort, poor LE power    Ambulation/Gait Ambulation/Gait assistance: Min guard Gait Distance (Feet): 45 Feet Assistive device: Rolling walker (2 wheels) Gait Pattern/deviations: Step-through pattern, Decreased stride length, Trunk flexed Gait velocity: decreased Gait velocity interpretation: <1.31 ft/sec, indicative of household ambulator   General Gait Details: cues to maintain feet inside RW, no overt LOB but trunk flexed with poor endurance, SpO2 stable on RA, >95%.     Balance Overall balance assessment: Mild deficits observed, not formally tested                                           Pertinent Vitals/Pain Pain Assessment Pain Assessment: No/denies pain    Home Living Family/patient expects to be discharged to:: Private residence Living Arrangements: Children (son and his wife) Available Help at Discharge: Family;Available  24 hours/day Type of Home: Mobile home Home Access: Stairs to enter Entrance Stairs-Rails: Can reach both Entrance Stairs-Number of Steps: 4   Home Layout: One level Home Equipment: Conservation officer, nature (2 wheels);Cane - single point;Electric  scooter;Shower seat Additional Comments: has a bar not installed yet    Prior Function Prior Level of Function : Independent/Modified Independent;Driving;History of Falls (last six months) (not driving often unless he has to)             Mobility Comments: pt reports no use of DME, reports 2 falls in last 6 months. reports sedentary lifestyle ADLs Comments: reports still driving at times, difficulty with cooking.     Hand Dominance   Dominant Hand: Right    Extremity/Trunk Assessment   Upper Extremity Assessment Upper Extremity Assessment: Overall WFL for tasks assessed    Lower Extremity Assessment Lower Extremity Assessment: Generalized weakness (poor ROM and endurance bilaterally, LLE grossly 4/5, RLE grossly 4+/5 to MMT)    Cervical / Trunk Assessment Cervical / Trunk Assessment: Normal  Communication   Communication: No difficulties  Cognition Arousal/Alertness: Awake/alert Behavior During Therapy: WFL for tasks assessed/performed Overall Cognitive Status: Within Functional Limits for tasks assessed                                          General Comments General comments (skin integrity, edema, etc.): VSS on RA SpO2 >95%, pt requesting 2L        Assessment/Plan    PT Assessment Patient needs continued PT services  PT Problem List Cardiopulmonary status limiting activity;Decreased strength;Decreased activity tolerance;Decreased mobility       PT Treatment Interventions DME instruction;Stair training;Gait training;Functional mobility training;Therapeutic activities;Therapeutic exercise;Balance training;Patient/family education    PT Goals (Current goals can be found in the Care Plan section)  Acute Rehab PT Goals Patient Stated Goal: to return home PT Goal Formulation: With patient Time For Goal Achievement: 08/13/22 Potential to Achieve Goals: Good    Frequency Min 3X/week        AM-PAC PT "6 Clicks" Mobility  Outcome Measure  Help needed turning from your back to your side while in a flat bed without using bedrails?: A Little Help needed moving from lying on your back to sitting on the side of a flat bed without using bedrails?: A Little Help needed moving to and from a bed to a chair (including a wheelchair)?: A Little Help needed standing up from a chair using your arms (e.g., wheelchair or bedside chair)?: A Little Help needed to walk in hospital room?: A Little Help needed climbing 3-5 steps with a railing? : A Lot 6 Click Score: 17    End of Session Equipment Utilized During Treatment: Gait belt;Oxygen Activity Tolerance: Patient tolerated treatment well Patient left: in bed Nurse Communication: Mobility status PT Visit Diagnosis: Other abnormalities of gait and mobility (R26.89)    Time: 9702-6378 PT Time Calculation (min) (ACUTE ONLY): 25 min   Charges:   PT Evaluation $PT Eval Low Complexity: 1 Low PT Treatments $Therapeutic Exercise: 8-22 mins        [place] Carbo, PT, DPT   Acute Rehabilitation Department  Sandra Cockayne 07/30/2022, 10:30 AM

## 2022-07-30 NOTE — Progress Notes (Signed)
PROGRESS NOTE    Gregory Alvarado  ERD:[id number] DOB: [birth year] DOA: 07/25/2022 PCP: Celene Squibb, MD   Brief Narrative:  Patient is a morbidly obese 71 year old Caucasian male with a past medical history significant for but not noted to COPD, chronic respiratory failure on 3 L of supplemental oxygen, history of chronic diastolic and systolic CHF, CAD with DES in 12/21 as well as a history of GI bleed, diabetes mellitus type 2, hyperlipidemia and tobacco abuse who presented to the [facility], ED with a chief complaint of tongue swelling that began around 12 PM and on 1111 he reported taking lisinopril.  He developed worsening hypoxia in the 80s in the ED and intubation was attempted but failed and emergent tracheostomy was also attempted without success.  Subsequently he was intubated after intubation patient bradycardia down to a pulse of 39 pulse was lost and he had 1 minute of CPR with ROSC.  PCCM was consulted and transferred to [facility] for further evaluation and admission.  On arrival he was encephalopathic and intubated so history was not obtained he subsequently treated for angioedema and he improved and was successfully extubated and refused BiPAP was placed on 6 L and his oxygen requirement is weaning down.  He was given IV ceftriaxone for aspiration and had no further stridor or angioedema and diet was tolerated.  He is improving slowly and will need a ambulatory home O2 screen prior to discharge and will continue with pulmonary toilet prior to discharge.  Assessment and Plan: No notes have been filed under this hospital service. Service: Hospitalist  Acute on chronic hypoxemic respiratory failure COPD on home oxygen 3LNC ACEi angioedema with subsequent cardiac arrest -He had endotracheal placed in the ED and was successfully extubated - Completing 3 days ceftriaxone for aspiration - no further stridor or laryngeal edema -Tolerating diet and will continue pulmonary hygiene and added  guaifenesin 1200 g p.o. twice daily, Xopenex and Atrovent every 8h flutter valve and incentive spirometry -SpO2: 97 % O2 Flow Rate (L/min): 3 L/min FiO2 (%): 40 %  -WBC was elevated but slowly trending downward from 13.6 and now 13.1 -We will repeat chest x-ray in the a.m. and amatory home O2 screen   Respiratory associated cardiac arrest Elevated Troponin and type II MI due to respiratory failure with demand ischemia, hx CAD -Appears neuro intact after arrest - echo reviewed shows LVEF reduced at 45% with akinetic apex. This appears to be a new WMA - appreciate cardiology involvement - outpatinet follow up and GDMT as tolerated recommended; repeat TTE was unchanged versus prior with chronic wall motion abnormalities and cardiology recommended continuing aspirin and beta-blocker with no further cardiac evaluation at this time and they are recommending outpatient follow-up  Chronic systolic and diastolic CHF -He had mild cardiomyopathy with wall motion abnormalities which is unchanged versus a prior -Cardiology recommending continuing Toprol with no ACE inhibitor or ARB given his angioedema and respiratory arrest -They have started Jardiance 10 mg p.o. daily and have deferred spironolactone for now given that his EF is greater than 35% -Cardiology is also recommending continuing Lasix and have transitioned the patient to p.o. and has had excellent diuresis   DM2 with hyperglycemia - add back half dose NPH 20 from home dose and is now on 12 units twice daily - continue SSI with moderate before meals and at bedtime - advancing diet, may need more control.    AKI - improving as patient's BUNs/creatinine went from 33/1.88 and is improved  to 35/1.30 - uop with tremendous response to lasix x1 -Avoid further nephrotoxic medications, contrast dyes, hypotension and dehydration to ensure adequate renal perfusion Repeat CMP in the a.m.   HTN - resumed metoprolol - monitor Bps prior to discharge -  has amlodipine listed as allergy and obviously cannot have ace/arb given angioedema.  -Continue to monitor blood pressures per protocol and last blood pressure reading was 109/63   Microcytic Anemia Patient's hemoglobin/hematocrit went from 10.0/34.2 on admission and trended down lower down to 7.7/26.6 but is now improved back to 9.5/32.3 with an MCV of 78.6 -Check anemia panel in a.m. -Continue to monitor for signs and symptoms of bleeding; no overt bleeding noted Repeat CBC in a.m.  Morbid Obesity -Complicates overall prognosis and care -Estimated body mass index is 41.79 kg/m as calculated from the following:   Height as of this encounter: '5\' 9"'$  (1.753 m).   Weight as of this encounter: 128.4 kg.  -Weight Loss and Dietary Counseling given   DVT prophylaxis: heparin injection 5,000 Units Start: 07/26/22 0600 SCDs Start: 07/26/22 0343    Code Status: Prior Family Communication: No family currently at bedside  Disposition Plan:  Level of care: Telemetry Medical Status is: Inpatient Remains inpatient appropriate because: Anticipating discharge in the next 24 to 48 hours as he will need to have another 1 more day to ensure that his respiratory status is stable and will do an ambulatory home O2 screen and repeat chest x-ray in a.m.   Consultants:  PCCM transfer Cardiology  Procedures:  Echocardiogram IMPRESSIONS     1. Left ventricular ejection fraction, by estimation, is 40 to 45%. The  left ventricle has mildly decreased function. The left ventricle  demonstrates regional wall motion abnormalities (see scoring  diagram/findings for description). There is moderate  left ventricular hypertrophy. Left ventricular diastolic function could  not be evaluated.   2. Right ventricular systolic function is mildly reduced. The right  ventricular size is normal.   3. The mitral valve is grossly normal. No evidence of mitral valve  regurgitation. No evidence of mitral stenosis.   4.  The aortic valve is tricuspid. Aortic valve regurgitation is not  visualized. No aortic stenosis is present.   FINDINGS   Left Ventricle: Left ventricular ejection fraction, by estimation, is 40  to 45%. The left ventricle has mildly decreased function. The left  ventricle demonstrates regional wall motion abnormalities. Definity  contrast agent was given IV to delineate the  left ventricular endocardial borders. The left ventricular internal cavity  size was normal in size. There is moderate left ventricular hypertrophy.  Left ventricular diastolic function could not be evaluated due to  nondiagnostic images. Left ventricular  diastolic function could not be evaluated.     LV Wall Scoring:  The apex is akinetic. The entire anterior wall, antero-lateral wall,  apical  lateral segment, basal inferior segment, and basal inferoseptal segment  are  hypokinetic. The entire anterior septum, mid and distal inferior wall,  posterior wall, and mid inferoseptal segment are normal.   Right Ventricle: The right ventricular size is normal. Right vetricular  wall thickness was not well visualized. Right ventricular systolic  function is mildly reduced.   Left Atrium: Left atrial size was normal in size.   Right Atrium: Right atrial size was normal in size.   Pericardium: Trivial pericardial effusion is present.   Mitral Valve: The mitral valve is grossly normal. There is mild  calcification of the mitral valve leaflet(s). No evidence of  mitral valve  regurgitation. No evidence of mitral valve stenosis.   Tricuspid Valve: The tricuspid valve is not well visualized. Tricuspid  valve regurgitation is not demonstrated. No evidence of tricuspid  stenosis.   Aortic Valve: The aortic valve is tricuspid. Aortic valve regurgitation is  not visualized. No aortic stenosis is present. Aortic valve mean gradient  measures 4.0 mmHg. Aortic valve peak gradient measures 6.9 mmHg. Aortic  valve area, by  VTI measures 2.59  cm.   Pulmonic Valve: The pulmonic valve was not well visualized. Pulmonic valve  regurgitation is not visualized. No evidence of pulmonic stenosis.   Aorta: The aortic root and ascending aorta are structurally normal, with  no evidence of dilitation.   Venous: Patient on Vent.   IAS/Shunts: No atrial level shunt detected by color flow Doppler.     LEFT VENTRICLE  PLAX 2D  LVIDd:         5.10 cm  LVIDs:         3.20 cm  LV PW:         1.30 cm  LV IVS:        1.50 cm  LVOT diam:     2.00 cm  LV SV:         53  LV SV Index:   22  LVOT Area:     3.14 cm     RIGHT VENTRICLE             IVC  RV S prime:     13.50 cm/s  IVC diam: 2.30 cm   LEFT ATRIUM             Index        RIGHT ATRIUM           Index  LA diam:        4.10 cm 1.69 cm/m   RA Area:     18.70 cm  LA Vol (A2C):   38.4 ml 15.87 ml/m  RA Volume:   51.80 ml  21.41 ml/m  LA Vol (A4C):   63.7 ml 26.32 ml/m  LA Biplane Vol: 54.0 ml 22.32 ml/m   AORTIC VALVE  AV Area (Vmax):    2.97 cm  AV Area (Vmean):   2.69 cm  AV Area (VTI):     2.59 cm  AV Vmax:           131.00 cm/s  AV Vmean:          87.700 cm/s  AV VTI:            0.206 m  AV Peak Grad:      6.9 mmHg  AV Mean Grad:      4.0 mmHg  LVOT Vmax:         124.00 cm/s  LVOT Vmean:        75.000 cm/s  LVOT VTI:          0.170 m  LVOT/AV VTI ratio: 0.83    AORTA  Ao Root diam: 3.10 cm  Ao Asc diam:  3.50 cm     SHUNTS  Systemic VTI:  0.17 m  Systemic Diam: 2.00 cm   Antimicrobials:  Anti-infectives (From admission, onward)    Start     Dose/Rate Route Frequency Ordered Stop   07/26/22 0815  cefTRIAXone (ROCEPHIN) 2 g in sodium chloride 0.9 % 100 mL IVPB        2 g 200 mL/hr over 30 Minutes Intravenous Every 24 hours 07/26/22  1245 07/28/22 0830       Subjective: Seen and examined at bedside and he is doing better.  Denied any chest pain and still little short of breath but was back on his home oxygen.  No other  concerns or complaints at this time.  Objective: Vitals:   07/30/22 0015 07/30/22 0407 07/30/22 0753 07/30/22 1121  BP: 120/68 131/71 (!) 118/52 109/63  Pulse: 79 82 85 78  Resp: '20 20 18 18  '$ Temp: 97.6 F (36.4 C) 98 F (36.7 C) 98.1 F (36.7 C) 98.5 F (36.9 C)  TempSrc: Oral Oral Oral Oral  SpO2: 100% 98% 94% 97%  Weight: 128.4 kg     Height:        Intake/Output Summary (Last 24 hours) at 07/30/2022 1741 Last data filed at 07/30/2022 8099 Gross per 24 hour  Intake 360 ml  Output 2250 ml  Net -1890 ml   Filed Weights   07/29/22 0352 07/29/22 2133 07/30/22 0015  Weight: 132.7 kg 128.2 kg 128.4 kg   Examination: Physical Exam:  Constitutional: WN/WD morbidly obese Caucasian male currently no acute distress Respiratory: Diminished to auscultation bilaterally with coarse breath sounds and has some rhonchi and some slight wheezing but no appreciable rales or crackles.  Wearing supplemental oxygen via nasal cannula Cardiovascular: RRR, no murmurs / rubs / gallops. S1 and S2 auscultated.  Mild lower extremity edema Abdomen: Soft, non-tender, distended secondary body habitus. Bowel sounds positive.  GU: Deferred. Musculoskeletal: No clubbing / cyanosis of digits/nails. No joint deformity upper and lower extremities Skin: No rashes, lesions, ulcers on limited skin evaluation. No induration; Warm and dry.  Neurologic: CN 2-12 grossly intact with no focal deficits. Romberg sign and cerebellar reflexes not assessed.  Psychiatric: Normal judgment and insight. Alert and oriented x 3. Normal mood and appropriate affect.   Data Reviewed: I have personally reviewed following labs and imaging studies  CBC: Recent Labs  Lab 07/25/22 1605 07/26/22 0007 07/26/22 0410 07/28/22 0426 07/29/22 0350 07/30/22 0032  WBC 11.5*  --  36.6* 14.1* 13.6* 13.1*  NEUTROABS 9.7*  --   --  12.2* 11.2* 10.7*  HGB 10.4* 12.2* 10.0* 7.7* 8.5* 9.5*  HCT 37.0* 36.0* 34.2* 26.6* 28.1* 32.3*  MCV  82.4  --  79.9* 79.4* 78.1* 78.6*  PLT 221  --  294 185 179 833   Basic Metabolic Panel: Recent Labs  Lab 07/26/22 0410 07/26/22 2041 07/27/22 0400 07/28/22 0426 07/29/22 0350 07/30/22 0032  NA 134* 135 133* 138 136 136  K 4.7 4.6 4.5 4.1 4.2 4.5  CL 98 101 99 104 96* 98  CO2 21* '27 25 26 30 28  '$ GLUCOSE 509* 156* 185* 164* 165* 197*  BUN 33* 38* 38* 44* 37* 35*  CREATININE 1.88* 1.42* 1.39* 1.27* 1.29* 1.30*  CALCIUM 9.0 9.1 8.7* 9.0 9.2 9.4  MG 1.8 2.2  --   --  2.0  --   PHOS  --  1.7*  --   --  3.5  --    GFR: Estimated Creatinine Clearance: 71.2 mL/min (A) (by C-G formula based on SCr of 1.3 mg/dL (H)). Liver Function Tests: Recent Labs  Lab 07/25/22 1605 07/26/22 0410  AST 12* 22  ALT 9 12  ALKPHOS 91 83  BILITOT 0.3 0.2*  PROT 7.4 6.5  ALBUMIN 3.6 3.1*   No results for input(s): "LIPASE", "AMYLASE" in the last 168 hours. No results for input(s): "AMMONIA" in the last 168 hours. Coagulation Profile: No results for  input(s): "INR", "PROTIME" in the last 168 hours. Cardiac Enzymes: No results for input(s): "CKTOTAL", "CKMB", "CKMBINDEX", "TROPONINI" in the last 168 hours. BNP (last 3 results) No results for input(s): "PROBNP" in the last 8760 hours. HbA1C: No results for input(s): "HGBA1C" in the last 72 hours. CBG: Recent Labs  Lab 07/29/22 1654 07/29/22 2140 07/30/22 0615 07/30/22 1120 07/30/22 1623  GLUCAP 145* 147* 144* 150* 113*   Lipid Profile: No results for input(s): "CHOL", "HDL", "LDLCALC", "TRIG", "CHOLHDL", "LDLDIRECT" in the last 72 hours. Thyroid Function Tests: No results for input(s): "TSH", "T4TOTAL", "FREET4", "T3FREE", "THYROIDAB" in the last 72 hours. Anemia Panel: No results for input(s): "VITAMINB12", "FOLATE", "FERRITIN", "TIBC", "IRON", "RETICCTPCT" in the last 72 hours. Sepsis Labs: No results for input(s): "PROCALCITON", "LATICACIDVEN" in the last 168 hours.  Recent Results (from the past 240 hour(s))  MRSA Next Gen by  PCR, Nasal     Status: None   Collection Time: 07/25/22  9:49 PM   Specimen: Nasal Mucosa; Nasal Swab  Result Value Ref Range Status   MRSA by PCR Next Gen NOT DETECTED NOT DETECTED Final    Comment: (NOTE) The GeneXpert MRSA Assay (FDA approved for NASAL specimens only), is one component of a comprehensive MRSA colonization surveillance program. It is not intended to diagnose MRSA infection nor to guide or monitor treatment for MRSA infections. Test performance is not FDA approved in patients less than 30 years old. Performed at [facility], [place] [address] [place], [place] [address]     Radiology Studies: No results found.  Scheduled Meds:  bacitracin   Topical BID   Chlorhexidine Gluconate Cloth  6 each Topical Q0600   empagliflozin  10 mg Oral Daily   heparin  5,000 Units Subcutaneous Q8H   hydrocortisone   Rectal BID   insulin aspart  0-15 Units Subcutaneous TID WC   insulin aspart  0-5 Units Subcutaneous QHS   insulin NPH Human  20 Units Subcutaneous BID AC & HS   lidocaine (PF)  5 mL Intradermal Once   metoprolol succinate  150 mg Oral Daily   Continuous Infusions:  sodium chloride Stopped (07/28/22 1405)    LOS: 5 days   Raiford Noble, DO [facility] Available via Epic secure chat 7am-7pm After these hours, please refer to coverage provider listed on [email or web address] 07/30/2022, 5:41 PM

## 2022-07-30 NOTE — Care Management Important Message (Signed)
Important Message  Patient Details  Name: MADSEN RIDDLE MRN: [id number] Date of Birth: 08-Nov-1950   Medicare Important Message Given:  Yes     Shelda Altes 07/30/2022, 8:11 AM

## 2022-07-31 ENCOUNTER — Inpatient Hospital Stay (HOSPITAL_COMMUNITY): Payer: Medicare Other

## 2022-07-31 DIAGNOSIS — I469 Cardiac arrest, cause unspecified: Secondary | ICD-10-CM

## 2022-07-31 DIAGNOSIS — D509 Iron deficiency anemia, unspecified: Secondary | ICD-10-CM

## 2022-07-31 DIAGNOSIS — I5042 Chronic combined systolic (congestive) and diastolic (congestive) heart failure: Secondary | ICD-10-CM

## 2022-07-31 LAB — COMPREHENSIVE METABOLIC PANEL
ALT: 18 U/L (ref 0–44)
AST: 16 U/L (ref 15–41)
Albumin: 3.1 g/dL — ABNORMAL LOW (ref 3.5–5.0)
Alkaline Phosphatase: 68 U/L (ref 38–126)
Anion gap: 11 (ref 5–15)
BUN: 31 mg/dL — ABNORMAL HIGH (ref 8–23)
CO2: 28 mmol/L (ref 22–32)
Calcium: 9.2 mg/dL (ref 8.9–10.3)
Chloride: 99 mmol/L (ref 98–111)
Creatinine, Ser: 1.17 mg/dL (ref 0.61–1.24)
GFR, Estimated: >60 mL/min (ref >60)
Glucose, Bld: 120 mg/dL — ABNORMAL HIGH (ref 70–99)
Potassium: 4.5 mmol/L (ref 3.5–5.1)
Sodium: 138 mmol/L (ref 135–145)
Total Bilirubin: 0.6 mg/dL (ref 0.3–1.2)
Total Protein: 6.7 g/dL (ref 6.5–8.1)

## 2022-07-31 LAB — CBC WITH DIFFERENTIAL/PLATELET
Abs Immature Granulocytes: 0.2 10*3/uL — ABNORMAL HIGH (ref 0.00–0.07)
Basophils Absolute: 0.1 10*3/uL (ref 0.0–0.1)
Basophils Relative: 1 %
Eosinophils Absolute: 0.4 10*3/uL (ref 0.0–0.5)
Eosinophils Relative: 3 %
HCT: 34.9 % — ABNORMAL LOW (ref 39.0–52.0)
Hemoglobin: 9.8 g/dL — ABNORMAL LOW (ref 13.0–17.0)
Immature Granulocytes: 2 %
Lymphocytes Relative: 9 %
Lymphs Abs: 1.2 10*3/uL (ref 0.7–4.0)
MCH: 22.6 pg — ABNORMAL LOW (ref 26.0–34.0)
MCHC: 28.1 g/dL — ABNORMAL LOW (ref 30.0–36.0)
MCV: 80.4 fL (ref 80.0–100.0)
Monocytes Absolute: 1.1 10*3/uL — ABNORMAL HIGH (ref 0.1–1.0)
Monocytes Relative: 8 %
Neutro Abs: 10.1 10*3/uL — ABNORMAL HIGH (ref 1.7–7.7)
Neutrophils Relative %: 77 %
Platelets: 208 10*3/uL (ref 150–400)
RBC: 4.34 MIL/uL (ref 4.22–5.81)
RDW: 17.2 % — ABNORMAL HIGH (ref 11.5–15.5)
WBC: 13.1 10*3/uL — ABNORMAL HIGH (ref 4.0–10.5)
nRBC: 0 % (ref 0.0–0.2)

## 2022-07-31 LAB — MAGNESIUM: Magnesium: 2.4 mg/dL (ref 1.7–2.4)

## 2022-07-31 LAB — GLUCOSE, CAPILLARY: Glucose-Capillary: 120 mg/dL — ABNORMAL HIGH (ref 70–99)

## 2022-07-31 LAB — PHOSPHORUS: Phosphorus: 3.6 mg/dL (ref 2.5–4.6)

## 2022-07-31 MED ORDER — METOPROLOL SUCCINATE ER 50 MG PO TB24: 150.0000 mg | ORAL_TABLET | Freq: Every day | ORAL | 0 refills | Status: DC

## 2022-07-31 MED ORDER — LEVALBUTEROL HCL 0.63 MG/3ML IN NEBU
0.6300 mg | INHALATION_SOLUTION | Freq: Two times a day (BID) | RESPIRATORY_TRACT | Status: DC
  Administered 2022-07-31: 0.63 mg via RESPIRATORY_TRACT
  Filled 2022-07-31: qty 3

## 2022-07-31 MED ORDER — HYDROCORTISONE (PERIANAL) 2.5 % EX CREA: TOPICAL_CREAM | Freq: Two times a day (BID) | CUTANEOUS | 0 refills | Status: DC

## 2022-07-31 MED ORDER — EMPAGLIFLOZIN 10 MG PO TABS: 10.0000 mg | ORAL_TABLET | Freq: Every day | ORAL | 0 refills | Status: AC

## 2022-07-31 MED ORDER — FUROSEMIDE 40 MG PO TABS: 20.0000 mg | ORAL_TABLET | Freq: Two times a day (BID) | ORAL | 0 refills | Status: DC

## 2022-07-31 MED ORDER — GUAIFENESIN ER 600 MG PO TB12: 600.0000 mg | ORAL_TABLET | Freq: Two times a day (BID) | ORAL | 0 refills | Status: AC

## 2022-07-31 MED ORDER — IPRATROPIUM BROMIDE 0.02 % IN SOLN
0.5000 mg | Freq: Two times a day (BID) | RESPIRATORY_TRACT | Status: DC
  Administered 2022-07-31: 0.5 mg via RESPIRATORY_TRACT
  Filled 2022-07-31: qty 2.5

## 2022-07-31 MED ORDER — BACITRACIN ZINC 500 UNIT/GM EX OINT: TOPICAL_OINTMENT | Freq: Two times a day (BID) | CUTANEOUS | 0 refills | Status: DC

## 2022-07-31 NOTE — Discharge Summary (Signed)
Physician Discharge Summary   Patient: Gregory Alvarado MRN: [id number] DOB: 1951/05/22  Admit date:     07/25/2022  Discharge date: 07/31/22  Discharge Physician: Raiford Noble, DO   PCP: Celene Squibb, MD   Recommendations at discharge:   Follow-up with PCP within 1 to 2 weeks and repeat CBC, CMP, mag, Phos within 1 week Follow-up with cardiology in outpatient setting within 1 to 2 weeks Follow-up with pulmonary if needed Follow-up with ENT for staple removal and will need to have staples in for 7 to 10 days per my discussion with the ENT physician Dr. Sabino Gasser  Discharge Diagnoses: Principal Problem:   Respiratory failure ([place]) Active Problems:   Chronic combined systolic and diastolic CHF (congestive heart failure) ([place])   Uncontrolled type 2 diabetes mellitus with hyperglycemia, with long-term current use of insulin (H[facility])   Obesity, Class III, BMI 40-49.9 (morbid obesity) ([place])   Iron deficiency anemia   Acute respiratory failure ([place])   Shock ([place])   Angio-edema   Demand ischemia of myocardium   ACS (acute coronary syndrome) ([place])   ACE inhibitor-aggravated angioedema, initial encounter   Cardiac arrest [facility])  Resolved Problems:   * No resolved hospital problems. *  Hospital Course: Patient is a morbidly obese 71 year old Caucasian male with a past medical history significant for but not noted to COPD, chronic respiratory failure on 3 L of supplemental oxygen, history of chronic diastolic and systolic CHF, CAD with DES in 12/21 as well as a history of GI bleed, diabetes mellitus type 2, hyperlipidemia and tobacco abuse who presented to the [facility], ED with a chief complaint of tongue swelling that began around 12 PM and on 1111 he reported taking lisinopril.  He developed worsening hypoxia in the 80s in the ED and intubation was attempted but failed and emergent tracheostomy was also attempted without success.  Subsequently he was intubated after intubation patient bradycardia  down to a pulse of 39 pulse was lost and he had 1 minute of CPR with ROSC.  PCCM was consulted and transferred to [facility] for further evaluation and admission.  On arrival he was encephalopathic and intubated so history was not obtained he subsequently treated for angioedema and he improved and was successfully extubated and refused BiPAP was placed on 6 L and his oxygen requirement is weaning down.  He was given IV ceftriaxone for aspiration and had no further stridor or angioedema and diet was tolerated.  He is improving slowly and will need a ambulatory home O2 screen prior to discharge and he did not desaturate and chest x-ray was showing no acute disease.  He improved and was deemed medically stable for discharge and will need to follow-up with PCP, pulmonary, cardiology as well as ENT in outpatient setting for staple removal in 7 to 10 days.  Assessment and Plan:  COPD on home oxygen 3LNC ACEi angioedema with subsequent cardiac arrest -He had endotracheal placed in the ED and was successfully extubated - Completing 3 days ceftriaxone for aspiration - no further stridor or laryngeal edema -Tolerating diet and will continue pulmonary hygiene and added guaifenesin 1200 g p.o. twice daily, Xopenex and Atrovent every 8h flutter valve and incentive spirometry -SpO2: 92 % O2 Flow Rate (L/min): 3 L/min FiO2 (%): 40 % -WBC was elevated but slowly trending downward from 13.6 and now 13.1 at the time of discharge -Repeat chest showed no acute cardiopulmonary disease. and an amatory home O2 screen did not show that the patient need  any further oxygen requirements than his baseline   Respiratory associated cardiac arrest Elevated Troponin and type II MI due to respiratory failure with demand ischemia, hx CAD -Appears neuro intact after arrest - echo reviewed shows LVEF reduced at 45% with akinetic apex. This appears to be a new WMA - appreciate cardiology involvement - outpatinet follow up and GDMT  as tolerated recommended; repeat TTE was unchanged versus prior with chronic wall motion abnormalities and cardiology recommended continuing aspirin and beta-blocker with no further cardiac evaluation at this time and they are recommending outpatient follow-up   Chronic systolic and diastolic CHF -He had mild cardiomyopathy with wall motion abnormalities which is unchanged versus a prior -Cardiology recommending continuing Toprol with no ACE inhibitor or ARB given his angioedema and respiratory arrest -They have started Jardiance 10 mg p.o. daily and have deferred spironolactone for now given that his EF is greater than 35% -Cardiology is also recommending continuing Lasix and have transitioned the patient to p.o. and has had excellent diuresis and he is now on Lasix 20 mg p.o. twice daily per cardiology recommendations   DM2 with hyperglycemia - add back half dose NPH 20 from home dose and is now on 12 units twice daily - continue SSI with moderate before meals and at bedtime - advancing diet, may need more control.  And he is stable -CBGs ranging from 113-150   AKI - improving as patient's BUNs/creatinine went from 33/1.88 and is improved to 35/1.30 yesterday and today is now 31/1.17 - uop with tremendous response to lasix x1 and will resume Lasix to 20 mg p.o. twice daily -Avoid further nephrotoxic medications, contrast dyes, hypotension and dehydration to ensure adequate renal perfusion Repeat CMP in the a.m.  Hypoalbuminemia -The patient's albumin level is now 3.1 -Continue to monitor and trend and repeat CMP in the outpatient setting   HTN - resumed metoprolol - monitor Bps prior to discharge - has amlodipine listed as allergy and obviously cannot have ace/arb given angioedema.  -Continue to monitor blood pressures per protocol and last blood pressure reading was 109/63   Microcytic Anemia Patient's hemoglobin/hematocrit went from 10.0/34.2 on admission and trended down lower down  to 7.7/26.6 but is now improved back to 9.5/32.3 yesterday and today it is now 9.8/34.9 with an MCV of 80.4 -Check anemia panel in a.m. -Continue to monitor for signs and symptoms of bleeding; no overt bleeding noted Repeat CBC in a.m.   Morbid Obesity -Complicates overall prognosis and care -Estimated body mass index is 41.2 kg/m as calculated from the following:   Height as of this encounter: '5\' 9"'$  (1.753 m).   Weight as of this encounter: 126.6 kg.  -Weight Loss and Dietary Counseling given  Nutrition Documentation    [place] ED to Hosp-Admission (Discharged) from 07/25/2022 in [place] HF PCU  Nutrition Problem Inadequate oral intake  Etiology inability to eat  Nutrition Goal Patient will meet greater than or equal to 90% of their needs  Interventions Refer to RD note for recommendations      Consultants: Cardiology, PCCM transfer Procedures performed: As delineated as above Disposition: Home health Diet recommendation:  Discharge Diet Orders (From admission, onward)     Start     Ordered   07/31/22 0000  Diet - low sodium heart healthy        07/31/22 1048   07/31/22 0000  Diet Carb Modified        07/31/22 1048  Cardiac and Carb modified diet DISCHARGE MEDICATION: Allergies as of 07/31/2022       Reactions   Lisinopril Swelling   ACEi angioedema requiring intubation 07/2022   Caduet [amlodipine-atorvastatin] Swelling        Medication List     STOP taking these medications    gabapentin 300 MG capsule Commonly known as: NEURONTIN   lisinopril 10 MG tablet Commonly known as: ZESTRIL   spironolactone 25 MG tablet Commonly known as: ALDACTONE       TAKE these medications    acetaminophen 500 MG tablet Commonly known as: TYLENOL Take 1,000 mg by mouth every 6 (six) hours as needed for mild pain.   albuterol 108 (90 Base) MCG/ACT inhaler Commonly known as: VENTOLIN HFA Inhale 2 puffs into the lungs every 4  (four) hours as needed for wheezing or shortness of breath.   atorvastatin 80 MG tablet Commonly known as: LIPITOR Take 80 mg by mouth daily.   bacitracin ointment Apply topically 2 (two) times daily.   Combivent Respimat 20-100 MCG/ACT Aers respimat Generic drug: Ipratropium-Albuterol Inhale 1 puff into the lungs every 6 (six) hours as needed for wheezing or shortness of breath.   cyanocobalamin 1000 MCG/ML injection Commonly known as: VITAMIN B12 Inject 1,000 mcg into the muscle every 30 (thirty) days.   diphenhydrAMINE 25 mg capsule Commonly known as: BENADRYL Take 25 mg by mouth once.   diphenhydramine-acetaminophen 25-500 MG Tabs tablet Commonly known as: TYLENOL PM Take 1 tablet by mouth at bedtime as needed (sleep).   empagliflozin 10 MG Tabs tablet Commonly known as: JARDIANCE Take 1 tablet (10 mg total) by mouth daily.   ferrous sulfate 325 (65 FE) MG tablet Take 1 tablet (325 mg total) by mouth 2 (two) times daily with a meal.   furosemide 40 MG tablet Commonly known as: LASIX Take 0.5 tablets (20 mg total) by mouth 2 (two) times daily.   guaiFENesin 600 MG 12 hr tablet Commonly known as: MUCINEX Take 1 tablet (600 mg total) by mouth 2 (two) times daily for 5 days.   hydrocortisone 2.5 % rectal cream Commonly known as: ANUSOL-HC Place rectally 2 (two) times daily.   insulin NPH-regular Human (70-30) 100 UNIT/ML injection Inject 40 Units into the skin in the morning and at bedtime.   levocetirizine 5 MG tablet Commonly known as: XYZAL Take 5 mg by mouth at bedtime.   metoprolol succinate 50 MG 24 hr tablet Commonly known as: TOPROL-XL Take 3 tablets (150 mg total) by mouth daily. What changed:  medication strength See the new instructions.   nitroGLYCERIN 0.4 MG SL tablet Commonly known as: NITROSTAT DISSOLVE 1 TABLET SUBLINGUALLY AS NEEDED FOR CHEST PAIN, MAY REPEAT EVERY 5 MINUTES. AFTER 3 CALL 911. What changed: See the new instructions.    pantoprazole 40 MG tablet Commonly known as: PROTONIX Take 40 mg by mouth daily.   Potassium 99 MG Tabs Take 99 mg by mouth daily as needed (cramps).   traMADol 50 MG tablet Commonly known as: ULTRAM Take 50-100 mg by mouth in the morning and at bedtime.   zolpidem 5 MG tablet Commonly known as: AMBIEN Take 5 mg by mouth at bedtime as needed for sleep.        Follow-up Information     [facility] [place] Follow up.   Specialty: [place] Why: [place] will call you to set up apt times Contact information: [address] [place] [address] [phone]  Celene Squibb, MD. Daphane Shepherd on 08/12/2022.   Specialty: Internal Medicine Why: '@10'$ :45am Contact information: [facility] [address] [phone]                Discharge Exam: Filed Weights   07/29/22 2133 07/30/22 0015 07/31/22 0358  Weight: 128.2 kg 128.4 kg 126.6 kg   Vitals:   07/31/22 0814 07/31/22 0943  BP: (!) 119/43   Pulse: 84 79  Resp: 18   Temp: 98 F (36.7 C)   SpO2: 92%    Examination: Physical Exam:  Constitutional: WN/WD morbidly obese chronically ill-appearing Caucasian male in no acute distress Respiratory: Diminished to auscultation bilaterally with coarse breath sounds, no wheezing, rales, rhonchi or crackles. Normal respiratory effort and patient is not tachypenic. No accessory muscle use.  Unlabored breathing Cardiovascular: RRR, no murmurs / rubs / gallops. S1 and S2 auscultated.  Mild lower extremity edema Abdomen: Soft, non-tender, distended secondary body habitus bowel sounds positive.  GU: Deferred. Musculoskeletal: No clubbing / cyanosis of digits/nails. No joint deformity upper and lower extremities.  Skin: No rashes, lesions, ulcers on limited skin evaluation. No induration; Warm and dry.  Neurologic: CN 2-12 grossly intact with no focal deficits. Romberg sign and cerebellar reflexes not assessed.  Psychiatric: Normal  judgment and insight. Alert and oriented x 3. Normal mood and appropriate affect.   Condition at discharge: stable  The results of significant diagnostics from this hospitalization (including imaging, microbiology, ancillary and laboratory) are listed below for reference.   Imaging Studies: DG CHEST PORT 1 VIEW  Result Date: 07/31/2022 CLINICAL DATA:  Shortness of breath EXAM: PORTABLE CHEST 1 VIEW COMPARISON:  Radiograph 07/28/2022 FINDINGS: Unchanged cardiomediastinal silhouette. No focal airspace consolidation. No pleural effusion or pneumothorax. There is no acute osseous abnormality. Thoracic spondylosis. Bilateral shoulder degenerative changes. IMPRESSION: No evidence of acute cardiopulmonary disease. Electronically Signed   By: Maurine Simmering M.D.   On: 07/31/2022 08:20   DG CHEST PORT 1 VIEW  Result Date: 07/28/2022 CLINICAL DATA:  Respiratory failure EXAM: PORTABLE CHEST 1 VIEW COMPARISON:  07/26/2022 FINDINGS: Endotracheal tube seen 8.1 cm above the carina, at the level of the clavicular heads. The retaining cuff appears hyperinflated expands the walls of the airway. Nasogastric tube extends into the upper abdomen beyond the margin of the examination. Left internal jugular central venous catheter tip noted within the superior vena cava, unchanged. Opacity at the left lung base may relate to overlying soft tissue. Lungs are otherwise clear. No pneumothorax or pleural effusion. Cardiac size is within normal limits. Pulmonary vascularity is normal. No acute bone abnormality. IMPRESSION: 1. Endotracheal tube 8.1 cm above the carina. The retaining cuff appears hyperinflated expands the walls of the airway. Electronically Signed   By: Fidela Salisbury M.D.   On: 07/28/2022 03:20   ECHOCARDIOGRAM COMPLETE  Result Date: 07/26/2022    ECHOCARDIOGRAM REPORT   [name] Date of Exam: 07/26/2022 Medical Rec #:  [id number]     Height:       69.0 in Accession #:    [id number]    Weight:        290.1 lb Date of Birth:  Mar 10, 1951     BSA:          2.420 m Patient Age:    78 years      BP:           166/60 mmHg Patient Gender: M  HR:           103 bpm. Exam Location:  Inpatient Procedure: 2D Echo, Cardiac Doppler, Color Doppler and Intracardiac            Opacification Agent Indications:    Shock  History:        Patient has prior history of Echocardiogram examinations, most                 recent 08/28/2021. CHF, CAD, COPD; Risk Factors:Diabetes,                 Dyslipidemia and Current Smoker.  Sonographer:    Clayton Lefort RDCS (AE) Referring Phys: Estill Cotta  Sonographer Comments: Technically difficult study due to poor echo windows. IMPRESSIONS  1. Left ventricular ejection fraction, by estimation, is 40 to 45%. The left ventricle has mildly decreased function. The left ventricle demonstrates regional wall motion abnormalities (see scoring diagram/findings for description). There is moderate left ventricular hypertrophy. Left ventricular diastolic function could not be evaluated.  2. Right ventricular systolic function is mildly reduced. The right ventricular size is normal.  3. The mitral valve is grossly normal. No evidence of mitral valve regurgitation. No evidence of mitral stenosis.  4. The aortic valve is tricuspid. Aortic valve regurgitation is not visualized. No aortic stenosis is present. FINDINGS  Left Ventricle: Left ventricular ejection fraction, by estimation, is 40 to 45%. The left ventricle has mildly decreased function. The left ventricle demonstrates regional wall motion abnormalities. Definity contrast agent was given IV to delineate the left ventricular endocardial borders. The left ventricular internal cavity size was normal in size. There is moderate left ventricular hypertrophy. Left ventricular diastolic function could not be evaluated due to nondiagnostic images. Left ventricular diastolic function could not be evaluated.  LV Wall Scoring: The apex is  akinetic. The entire anterior wall, antero-lateral wall, apical lateral segment, basal inferior segment, and basal inferoseptal segment are hypokinetic. The entire anterior septum, mid and distal inferior wall, posterior wall, and mid inferoseptal segment are normal. Right Ventricle: The right ventricular size is normal. Right vetricular wall thickness was not well visualized. Right ventricular systolic function is mildly reduced. Left Atrium: Left atrial size was normal in size. Right Atrium: Right atrial size was normal in size. Pericardium: Trivial pericardial effusion is present. Mitral Valve: The mitral valve is grossly normal. There is mild calcification of the mitral valve leaflet(s). No evidence of mitral valve regurgitation. No evidence of mitral valve stenosis. Tricuspid Valve: The tricuspid valve is not well visualized. Tricuspid valve regurgitation is not demonstrated. No evidence of tricuspid stenosis. Aortic Valve: The aortic valve is tricuspid. Aortic valve regurgitation is not visualized. No aortic stenosis is present. Aortic valve mean gradient measures 4.0 mmHg. Aortic valve peak gradient measures 6.9 mmHg. Aortic valve area, by VTI measures 2.59 cm. Pulmonic Valve: The pulmonic valve was not well visualized. Pulmonic valve regurgitation is not visualized. No evidence of pulmonic stenosis. Aorta: The aortic root and ascending aorta are structurally normal, with no evidence of dilitation. Venous: Patient on Vent. IAS/Shunts: No atrial level shunt detected by color flow Doppler.  LEFT VENTRICLE PLAX 2D LVIDd:         5.10 cm LVIDs:         3.20 cm LV PW:         1.30 cm LV IVS:        1.50 cm LVOT diam:     2.00 cm LV SV:  53 LV SV Index:   22 LVOT Area:     3.14 cm  RIGHT VENTRICLE             IVC RV S prime:     13.50 cm/s  IVC diam: 2.30 cm LEFT ATRIUM             Index        RIGHT ATRIUM           Index LA diam:        4.10 cm 1.69 cm/m   RA Area:     18.70 cm LA Vol (A2C):   38.4 ml  15.87 ml/m  RA Volume:   51.80 ml  21.41 ml/m LA Vol (A4C):   63.7 ml 26.32 ml/m LA Biplane Vol: 54.0 ml 22.32 ml/m  AORTIC VALVE AV Area (Vmax):    2.97 cm AV Area (Vmean):   2.69 cm AV Area (VTI):     2.59 cm AV Vmax:           131.00 cm/s AV Vmean:          87.700 cm/s AV VTI:            0.206 m AV Peak Grad:      6.9 mmHg AV Mean Grad:      4.0 mmHg LVOT Vmax:         124.00 cm/s LVOT Vmean:        75.000 cm/s LVOT VTI:          0.170 m LVOT/AV VTI ratio: 0.83  AORTA Ao Root diam: 3.10 cm Ao Asc diam:  3.50 cm  SHUNTS Systemic VTI:  0.17 m Systemic Diam: 2.00 cm Vishnu Priya Mallipeddi Electronically signed by Lorelee Cover Mallipeddi Signature Date/Time: 07/26/2022/3:58:31 PM    Final    DG CHEST PORT 1 VIEW  Result Date: 07/26/2022 CLINICAL DATA:  Encounter for central line placement. EXAM: PORTABLE CHEST 1 VIEW COMPARISON:  07/26/2022 FINDINGS: Interval placement of left IJ catheter with tip in the projection of the superior cavoatrial junction. No pneumothorax identified. ET tube tip is above the carina. There is an enteric tube with tip and side port below the GE junction. Stable cardiomediastinal contours. Diffuse pulmonary vascular congestion appears unchanged. Blunting of the left costophrenic angle is noted, similar to previous exam and may reflect artifact from overlying external device. IMPRESSION: 1. Interval placement of left IJ catheter with tip in the projection of the superior cavoatrial junction. No pneumothorax. 2. Stable pulmonary vascular congestion. Electronically Signed   By: Kerby Moors M.D.   On: 07/26/2022 11:38   US RENAL  Result Date: 07/26/2022 CLINICAL DATA:  71 year old male with history of acute kidney injury. EXAM: RENAL / URINARY TRACT ULTRASOUND COMPLETE COMPARISON:  No priors. FINDINGS: Right Kidney: Renal measurements: 12.1 x 6.7 x 6.1 cm = volume: 256.8 mL. Echogenicity within normal limits. No mass or hydronephrosis visualized. Trace amount of right  perinephric fluid. Left Kidney: Renal measurements: 12.3 x 5.5 x 6.6 cm = volume: 234.7 mL. Echogenicity within normal limits. No mass or hydronephrosis visualized. Bladder: Not visualized. Other: None. IMPRESSION: 1. Trace amount of perinephric fluid adjacent to the right kidney. Sonographic appearance of the kidneys is otherwise normal bilaterally. Specifically, no hydronephrosis. 2. Limited study which was unable to visualize the urinary bladder. Electronically Signed   By: Vinnie Langton M.D.   On: 07/26/2022 10:19   DG CHEST PORT 1 VIEW  Result Date: 07/26/2022 CLINICAL DATA:  Respirator dependent  EXAM: PORTABLE CHEST 1 VIEW COMPARISON:  Earlier today at 12:08 a.m. FINDINGS: 5:01 a.m. Endotracheal tube terminates 5.6 cm above carina. Apical lordotic positioning. External pacer/defibrillator projects over the left side of the chest. Midline trachea. Mild cardiomegaly. No pleural effusion or pneumothorax. Minimal worsening in interstitial prominence and indistinctness. Persistent left base atelectasis. IMPRESSION: 1. Appropriate position of endotracheal tube. 2. Slight worsening in aeration, likely worsening interstitial edema. 3. Similar left base atelectasis. Electronically Signed   By: Abigail Miyamoto M.D.   On: 07/26/2022 08:57   DG CHEST PORT 1 VIEW  Result Date: 07/26/2022 CLINICAL DATA:  Intubation. EXAM: PORTABLE CHEST 1 VIEW COMPARISON:  Chest radiograph dated 07/25/2022. FINDINGS: Endotracheal tube with tip approximately 4.5 cm above the carina. Slight improvement in bilateral reticulonodular densities. No large pleural effusion. No pneumothorax. Stable cardiomediastinal silhouette. No acute osseous pathology. IMPRESSION: 1. Endotracheal tube above the carina. 2. Slight improvement in bilateral reticulonodular densities. Electronically Signed   By: Anner Crete M.D.   On: 07/26/2022 00:08   DG Chest Portable 1 View  Result Date: 07/25/2022 CLINICAL DATA:  Intubation EXAM: PORTABLE  CHEST 1 VIEW COMPARISON:  03/12/2022 FINDINGS: Cardiomegaly. Diffuse bilateral interstitial pulmonary opacity. Endotracheal tube positioned with tip below the thoracic inlet. Osseous structures unremarkable. IMPRESSION: 1. Cardiomegaly with diffuse bilateral interstitial pulmonary opacity, likely edema. 2. Endotracheal tube positioned with tip below the thoracic inlet. Electronically Signed   By: Delanna Ahmadi M.D.   On: 07/25/2022 17:58    Microbiology: Results for orders placed or performed during the hospital encounter of 07/25/22  MRSA Next Gen by PCR, Nasal     Status: None   Collection Time: 07/25/22  9:49 PM   Specimen: Nasal Mucosa; Nasal Swab  Result Value Ref Range Status   MRSA by PCR Next Gen NOT DETECTED NOT DETECTED Final    Comment: (NOTE) The GeneXpert MRSA Assay (FDA approved for NASAL specimens only), is one component of a comprehensive MRSA colonization surveillance program. It is not intended to diagnose MRSA infection nor to guide or monitor treatment for MRSA infections. Test performance is not FDA approved in patients less than 80 years old. Performed at [facility], [place] [address] [place], [place] [address]     Labs: CBC: Recent Labs  Lab 07/26/22 0410 07/28/22 0426 07/29/22 0350 07/30/22 0032 07/31/22 0052  WBC 36.6* 14.1* 13.6* 13.1* 13.1*  NEUTROABS  --  12.2* 11.2* 10.7* 10.1*  HGB 10.0* 7.7* 8.5* 9.5* 9.8*  HCT 34.2* 26.6* 28.1* 32.3* 34.9*  MCV 79.9* 79.4* 78.1* 78.6* 80.4  PLT 294 185 179 191 947   Basic Metabolic Panel: Recent Labs  Lab 07/26/22 0410 07/26/22 2041 07/27/22 0400 07/28/22 0426 07/29/22 0350 07/30/22 0032 07/31/22 0052  NA 134* 135 133* 138 136 136 138  K 4.7 4.6 4.5 4.1 4.2 4.5 4.5  CL 98 101 99 104 96* 98 99  CO2 21* '27 25 26 30 28 28  '$ GLUCOSE 509* 156* 185* 164* 165* 197* 120*  BUN 33* 38* 38* 44* 37* 35* 31*  CREATININE 1.88* 1.42* 1.39* 1.27* 1.29* 1.30* 1.17  CALCIUM 9.0 9.1 8.7* 9.0 9.2 9.4 9.2  MG 1.8  2.2  --   --  2.0  --  2.4  PHOS  --  1.7*  --   --  3.5  --  3.6   Liver Function Tests: Recent Labs  Lab 07/26/22 0410 07/31/22 0052  AST 22 16  ALT 12 18  ALKPHOS 83 68  BILITOT 0.2* 0.6  PROT 6.5 6.7  ALBUMIN 3.1* 3.1*   CBG: Recent Labs  Lab 07/30/22 0615 07/30/22 1120 07/30/22 1623 07/30/22 2057 07/31/22 0601  GLUCAP 144* 150* 113* 140* 120*   Discharge time spent: greater than 30 minutes.  Signed: Raiford Noble, DO [facility] 08/01/2022

## 2022-07-31 NOTE — TOC Transition Note (Addendum)
Transition of Care [facility]) - CM/SW Discharge Note   Patient Details  Name: Gregory Alvarado MRN: [id number] Date of Birth: Jun 24, 1951  Transition of Care [facility]) CM/SW Contact:  Zenon Mayo, RN Phone Number: 07/31/2022, 11:23 AM   Clinical Narrative:    Patient is for dc home, his friend is at the bedside here to transport him home. NCM notified Marjory Lies with [facility] of patient's dc.     Final next level of care: Home w Home Health Services Barriers to Discharge: Continued Medical Work up   Patient Goals and CMS Choice   CMS Medicare.gov Compare Post Acute Care list provided to:: Patient Choice offered to / list presented to : Patient  Discharge Placement                       Discharge Plan and Services In-house Referral: NA Discharge Planning Services: CM Consult Post Acute Care Choice: Home Health          DME Arranged: N/A         HH Arranged: PT Elizabeth Date [place] [facility] Contacted: 07/30/22 Time [place] [facility] Contacted: [phone] Representative spoke with at [place] ([place]) Interventions     Readmission Risk Interventions    07/30/2022    2:56 PM 08/29/2021   12:56 PM  Readmission Risk Prevention Plan  Transportation Screening Complete Complete  PCP or Specialist Appt within 3-5 Days Complete   Home Care Screening  Complete  Medication Review (RN CM)  Complete  HRI or Home Care Consult Complete   Social Work Consult for [place] Planning/Counseling Complete   Palliative Care Screening Not Applicable   Medication Review Press photographer) Complete

## 2022-07-31 NOTE — Progress Notes (Signed)
   07/31/22 1045  Mobility  Activity Ambulated with assistance in hallway  Level of Assistance Contact guard assist, steadying assist  Assistive Device Front wheel walker  Distance Ambulated (ft) 200 ft  Activity Response Tolerated well  Mobility Referral Yes  $Mobility charge 1 Mobility   Mobility Specialist Progress Note  Pt was in bed and agreeable. X1 standing break d/t fatigue. Returned to bed w/ all needs met and call bell in reach.  Lucious Groves Mobility Specialist

## 2022-08-01 DIAGNOSIS — I469 Cardiac arrest, cause unspecified: Secondary | ICD-10-CM | POA: Insufficient documentation

## 2022-08-01 DIAGNOSIS — T464X5A Adverse effect of angiotensin-converting-enzyme inhibitors, initial encounter: Secondary | ICD-10-CM | POA: Insufficient documentation

## 2022-08-01 DIAGNOSIS — T783XXA Angioneurotic edema, initial encounter: Secondary | ICD-10-CM | POA: Insufficient documentation

## 2022-08-06 DIAGNOSIS — J449 Chronic obstructive pulmonary disease, unspecified: Secondary | ICD-10-CM | POA: Diagnosis not present

## 2022-08-12 DIAGNOSIS — R059 Cough, unspecified: Secondary | ICD-10-CM | POA: Diagnosis not present

## 2022-08-12 DIAGNOSIS — J9611 Chronic respiratory failure with hypoxia: Secondary | ICD-10-CM | POA: Diagnosis not present

## 2022-08-12 DIAGNOSIS — Z79891 Long term (current) use of opiate analgesic: Secondary | ICD-10-CM | POA: Diagnosis not present

## 2022-08-12 DIAGNOSIS — I25118 Atherosclerotic heart disease of native coronary artery with other forms of angina pectoris: Secondary | ICD-10-CM | POA: Diagnosis not present

## 2022-08-12 DIAGNOSIS — I5042 Chronic combined systolic (congestive) and diastolic (congestive) heart failure: Secondary | ICD-10-CM | POA: Diagnosis not present

## 2022-08-12 DIAGNOSIS — Z758 Other problems related to medical facilities and other health care: Secondary | ICD-10-CM | POA: Diagnosis not present

## 2022-08-12 DIAGNOSIS — E1169 Type 2 diabetes mellitus with other specified complication: Secondary | ICD-10-CM | POA: Diagnosis not present

## 2022-08-12 DIAGNOSIS — T464X5A Adverse effect of angiotensin-converting-enzyme inhibitors, initial encounter: Secondary | ICD-10-CM | POA: Diagnosis not present

## 2022-08-12 DIAGNOSIS — J449 Chronic obstructive pulmonary disease, unspecified: Secondary | ICD-10-CM | POA: Diagnosis not present

## 2022-08-12 DIAGNOSIS — Z794 Long term (current) use of insulin: Secondary | ICD-10-CM | POA: Diagnosis not present

## 2022-08-12 DIAGNOSIS — E1151 Type 2 diabetes mellitus with diabetic peripheral angiopathy without gangrene: Secondary | ICD-10-CM | POA: Diagnosis not present

## 2022-08-12 DIAGNOSIS — I5022 Chronic systolic (congestive) heart failure: Secondary | ICD-10-CM | POA: Diagnosis not present

## 2022-08-17 ENCOUNTER — Encounter: Payer: Self-pay | Admitting: Internal Medicine

## 2022-08-17 DIAGNOSIS — Z8711 Personal history of peptic ulcer disease: Secondary | ICD-10-CM | POA: Diagnosis not present

## 2022-08-17 DIAGNOSIS — T783XXD Angioneurotic edema, subsequent encounter: Secondary | ICD-10-CM | POA: Diagnosis not present

## 2022-08-17 DIAGNOSIS — J449 Chronic obstructive pulmonary disease, unspecified: Secondary | ICD-10-CM | POA: Diagnosis not present

## 2022-08-17 DIAGNOSIS — I2489 Other forms of acute ischemic heart disease: Secondary | ICD-10-CM | POA: Diagnosis not present

## 2022-08-17 DIAGNOSIS — E875 Hyperkalemia: Secondary | ICD-10-CM | POA: Diagnosis not present

## 2022-08-17 DIAGNOSIS — E1122 Type 2 diabetes mellitus with diabetic chronic kidney disease: Secondary | ICD-10-CM | POA: Diagnosis not present

## 2022-08-17 DIAGNOSIS — N182 Chronic kidney disease, stage 2 (mild): Secondary | ICD-10-CM | POA: Diagnosis not present

## 2022-08-17 DIAGNOSIS — T464X5A Adverse effect of angiotensin-converting-enzyme inhibitors, initial encounter: Secondary | ICD-10-CM | POA: Diagnosis not present

## 2022-08-17 DIAGNOSIS — Z9981 Dependence on supplemental oxygen: Secondary | ICD-10-CM | POA: Diagnosis not present

## 2022-08-17 DIAGNOSIS — D509 Iron deficiency anemia, unspecified: Secondary | ICD-10-CM | POA: Diagnosis not present

## 2022-08-17 DIAGNOSIS — M1990 Unspecified osteoarthritis, unspecified site: Secondary | ICD-10-CM | POA: Diagnosis not present

## 2022-08-17 DIAGNOSIS — F17210 Nicotine dependence, cigarettes, uncomplicated: Secondary | ICD-10-CM | POA: Diagnosis not present

## 2022-08-17 DIAGNOSIS — I2510 Atherosclerotic heart disease of native coronary artery without angina pectoris: Secondary | ICD-10-CM | POA: Diagnosis not present

## 2022-08-17 DIAGNOSIS — J9621 Acute and chronic respiratory failure with hypoxia: Secondary | ICD-10-CM | POA: Diagnosis not present

## 2022-08-17 DIAGNOSIS — I5042 Chronic combined systolic (congestive) and diastolic (congestive) heart failure: Secondary | ICD-10-CM | POA: Diagnosis not present

## 2022-08-17 DIAGNOSIS — I10 Essential (primary) hypertension: Secondary | ICD-10-CM | POA: Diagnosis not present

## 2022-08-17 DIAGNOSIS — T464X5D Adverse effect of angiotensin-converting-enzyme inhibitors, subsequent encounter: Secondary | ICD-10-CM | POA: Diagnosis not present

## 2022-08-17 DIAGNOSIS — G4700 Insomnia, unspecified: Secondary | ICD-10-CM | POA: Diagnosis not present

## 2022-08-17 DIAGNOSIS — E1169 Type 2 diabetes mellitus with other specified complication: Secondary | ICD-10-CM | POA: Diagnosis not present

## 2022-08-17 DIAGNOSIS — E785 Hyperlipidemia, unspecified: Secondary | ICD-10-CM | POA: Diagnosis not present

## 2022-08-17 DIAGNOSIS — J9611 Chronic respiratory failure with hypoxia: Secondary | ICD-10-CM | POA: Diagnosis not present

## 2022-08-17 DIAGNOSIS — Z9181 History of falling: Secondary | ICD-10-CM | POA: Diagnosis not present

## 2022-08-17 DIAGNOSIS — J9622 Acute and chronic respiratory failure with hypercapnia: Secondary | ICD-10-CM | POA: Diagnosis not present

## 2022-08-17 DIAGNOSIS — Z86010 Personal history of colonic polyps: Secondary | ICD-10-CM | POA: Diagnosis not present

## 2022-08-17 DIAGNOSIS — E782 Mixed hyperlipidemia: Secondary | ICD-10-CM | POA: Diagnosis not present

## 2022-08-17 DIAGNOSIS — Z79891 Long term (current) use of opiate analgesic: Secondary | ICD-10-CM | POA: Diagnosis not present

## 2022-08-17 DIAGNOSIS — I130 Hypertensive heart and chronic kidney disease with heart failure and stage 1 through stage 4 chronic kidney disease, or unspecified chronic kidney disease: Secondary | ICD-10-CM | POA: Diagnosis not present

## 2022-08-17 DIAGNOSIS — I110 Hypertensive heart disease with heart failure: Secondary | ICD-10-CM | POA: Diagnosis not present

## 2022-08-17 DIAGNOSIS — R1319 Other dysphagia: Secondary | ICD-10-CM | POA: Diagnosis not present

## 2022-08-28 DIAGNOSIS — J9621 Acute and chronic respiratory failure with hypoxia: Secondary | ICD-10-CM | POA: Diagnosis not present

## 2022-08-28 DIAGNOSIS — R1319 Other dysphagia: Secondary | ICD-10-CM | POA: Diagnosis not present

## 2022-08-28 DIAGNOSIS — F17210 Nicotine dependence, cigarettes, uncomplicated: Secondary | ICD-10-CM | POA: Diagnosis not present

## 2022-08-28 DIAGNOSIS — E875 Hyperkalemia: Secondary | ICD-10-CM | POA: Diagnosis not present

## 2022-08-28 DIAGNOSIS — Z8711 Personal history of peptic ulcer disease: Secondary | ICD-10-CM | POA: Diagnosis not present

## 2022-08-28 DIAGNOSIS — E1122 Type 2 diabetes mellitus with diabetic chronic kidney disease: Secondary | ICD-10-CM | POA: Diagnosis not present

## 2022-08-28 DIAGNOSIS — D509 Iron deficiency anemia, unspecified: Secondary | ICD-10-CM | POA: Diagnosis not present

## 2022-08-28 DIAGNOSIS — Z86010 Personal history of colonic polyps: Secondary | ICD-10-CM | POA: Diagnosis not present

## 2022-08-28 DIAGNOSIS — N182 Chronic kidney disease, stage 2 (mild): Secondary | ICD-10-CM | POA: Diagnosis not present

## 2022-08-28 DIAGNOSIS — I2510 Atherosclerotic heart disease of native coronary artery without angina pectoris: Secondary | ICD-10-CM | POA: Diagnosis not present

## 2022-08-28 DIAGNOSIS — I2489 Other forms of acute ischemic heart disease: Secondary | ICD-10-CM | POA: Diagnosis not present

## 2022-08-28 DIAGNOSIS — J9622 Acute and chronic respiratory failure with hypercapnia: Secondary | ICD-10-CM | POA: Diagnosis not present

## 2022-08-28 DIAGNOSIS — G4700 Insomnia, unspecified: Secondary | ICD-10-CM | POA: Diagnosis not present

## 2022-08-28 DIAGNOSIS — T464X5D Adverse effect of angiotensin-converting-enzyme inhibitors, subsequent encounter: Secondary | ICD-10-CM | POA: Diagnosis not present

## 2022-08-28 DIAGNOSIS — I5042 Chronic combined systolic (congestive) and diastolic (congestive) heart failure: Secondary | ICD-10-CM | POA: Diagnosis not present

## 2022-08-28 DIAGNOSIS — E785 Hyperlipidemia, unspecified: Secondary | ICD-10-CM | POA: Diagnosis not present

## 2022-08-28 DIAGNOSIS — J449 Chronic obstructive pulmonary disease, unspecified: Secondary | ICD-10-CM | POA: Diagnosis not present

## 2022-08-28 DIAGNOSIS — M1990 Unspecified osteoarthritis, unspecified site: Secondary | ICD-10-CM | POA: Diagnosis not present

## 2022-08-28 DIAGNOSIS — Z9181 History of falling: Secondary | ICD-10-CM | POA: Diagnosis not present

## 2022-08-28 DIAGNOSIS — I130 Hypertensive heart and chronic kidney disease with heart failure and stage 1 through stage 4 chronic kidney disease, or unspecified chronic kidney disease: Secondary | ICD-10-CM | POA: Diagnosis not present

## 2022-08-28 DIAGNOSIS — T783XXD Angioneurotic edema, subsequent encounter: Secondary | ICD-10-CM | POA: Diagnosis not present

## 2022-08-28 DIAGNOSIS — Z9981 Dependence on supplemental oxygen: Secondary | ICD-10-CM | POA: Diagnosis not present

## 2022-09-03 DIAGNOSIS — I2510 Atherosclerotic heart disease of native coronary artery without angina pectoris: Secondary | ICD-10-CM | POA: Diagnosis not present

## 2022-09-03 DIAGNOSIS — M1990 Unspecified osteoarthritis, unspecified site: Secondary | ICD-10-CM | POA: Diagnosis not present

## 2022-09-03 DIAGNOSIS — I2489 Other forms of acute ischemic heart disease: Secondary | ICD-10-CM | POA: Diagnosis not present

## 2022-09-03 DIAGNOSIS — J9622 Acute and chronic respiratory failure with hypercapnia: Secondary | ICD-10-CM | POA: Diagnosis not present

## 2022-09-03 DIAGNOSIS — R1319 Other dysphagia: Secondary | ICD-10-CM | POA: Diagnosis not present

## 2022-09-03 DIAGNOSIS — E875 Hyperkalemia: Secondary | ICD-10-CM | POA: Diagnosis not present

## 2022-09-03 DIAGNOSIS — D509 Iron deficiency anemia, unspecified: Secondary | ICD-10-CM | POA: Diagnosis not present

## 2022-09-03 DIAGNOSIS — N182 Chronic kidney disease, stage 2 (mild): Secondary | ICD-10-CM | POA: Diagnosis not present

## 2022-09-03 DIAGNOSIS — G4700 Insomnia, unspecified: Secondary | ICD-10-CM | POA: Diagnosis not present

## 2022-09-03 DIAGNOSIS — E1122 Type 2 diabetes mellitus with diabetic chronic kidney disease: Secondary | ICD-10-CM | POA: Diagnosis not present

## 2022-09-03 DIAGNOSIS — F17210 Nicotine dependence, cigarettes, uncomplicated: Secondary | ICD-10-CM | POA: Diagnosis not present

## 2022-09-03 DIAGNOSIS — E785 Hyperlipidemia, unspecified: Secondary | ICD-10-CM | POA: Diagnosis not present

## 2022-09-03 DIAGNOSIS — I130 Hypertensive heart and chronic kidney disease with heart failure and stage 1 through stage 4 chronic kidney disease, or unspecified chronic kidney disease: Secondary | ICD-10-CM | POA: Diagnosis not present

## 2022-09-03 DIAGNOSIS — Z86010 Personal history of colonic polyps: Secondary | ICD-10-CM | POA: Diagnosis not present

## 2022-09-03 DIAGNOSIS — J9621 Acute and chronic respiratory failure with hypoxia: Secondary | ICD-10-CM | POA: Diagnosis not present

## 2022-09-03 DIAGNOSIS — T783XXD Angioneurotic edema, subsequent encounter: Secondary | ICD-10-CM | POA: Diagnosis not present

## 2022-09-03 DIAGNOSIS — I5042 Chronic combined systolic (congestive) and diastolic (congestive) heart failure: Secondary | ICD-10-CM | POA: Diagnosis not present

## 2022-09-03 DIAGNOSIS — Z9181 History of falling: Secondary | ICD-10-CM | POA: Diagnosis not present

## 2022-09-03 DIAGNOSIS — J449 Chronic obstructive pulmonary disease, unspecified: Secondary | ICD-10-CM | POA: Diagnosis not present

## 2022-09-03 DIAGNOSIS — Z8711 Personal history of peptic ulcer disease: Secondary | ICD-10-CM | POA: Diagnosis not present

## 2022-09-03 DIAGNOSIS — Z9981 Dependence on supplemental oxygen: Secondary | ICD-10-CM | POA: Diagnosis not present

## 2022-09-03 DIAGNOSIS — T464X5D Adverse effect of angiotensin-converting-enzyme inhibitors, subsequent encounter: Secondary | ICD-10-CM | POA: Diagnosis not present

## 2022-09-05 DIAGNOSIS — J449 Chronic obstructive pulmonary disease, unspecified: Secondary | ICD-10-CM | POA: Diagnosis not present

## 2022-09-09 DIAGNOSIS — R1319 Other dysphagia: Secondary | ICD-10-CM | POA: Diagnosis not present

## 2022-09-09 DIAGNOSIS — I2489 Other forms of acute ischemic heart disease: Secondary | ICD-10-CM | POA: Diagnosis not present

## 2022-09-09 DIAGNOSIS — E785 Hyperlipidemia, unspecified: Secondary | ICD-10-CM | POA: Diagnosis not present

## 2022-09-09 DIAGNOSIS — E1122 Type 2 diabetes mellitus with diabetic chronic kidney disease: Secondary | ICD-10-CM | POA: Diagnosis not present

## 2022-09-09 DIAGNOSIS — Z86010 Personal history of colonic polyps: Secondary | ICD-10-CM | POA: Diagnosis not present

## 2022-09-09 DIAGNOSIS — J449 Chronic obstructive pulmonary disease, unspecified: Secondary | ICD-10-CM | POA: Diagnosis not present

## 2022-09-09 DIAGNOSIS — T464X5D Adverse effect of angiotensin-converting-enzyme inhibitors, subsequent encounter: Secondary | ICD-10-CM | POA: Diagnosis not present

## 2022-09-09 DIAGNOSIS — G4700 Insomnia, unspecified: Secondary | ICD-10-CM | POA: Diagnosis not present

## 2022-09-09 DIAGNOSIS — Z9981 Dependence on supplemental oxygen: Secondary | ICD-10-CM | POA: Diagnosis not present

## 2022-09-09 DIAGNOSIS — J9622 Acute and chronic respiratory failure with hypercapnia: Secondary | ICD-10-CM | POA: Diagnosis not present

## 2022-09-09 DIAGNOSIS — N182 Chronic kidney disease, stage 2 (mild): Secondary | ICD-10-CM | POA: Diagnosis not present

## 2022-09-09 DIAGNOSIS — F17210 Nicotine dependence, cigarettes, uncomplicated: Secondary | ICD-10-CM | POA: Diagnosis not present

## 2022-09-09 DIAGNOSIS — T783XXD Angioneurotic edema, subsequent encounter: Secondary | ICD-10-CM | POA: Diagnosis not present

## 2022-09-09 DIAGNOSIS — Z8711 Personal history of peptic ulcer disease: Secondary | ICD-10-CM | POA: Diagnosis not present

## 2022-09-09 DIAGNOSIS — M1990 Unspecified osteoarthritis, unspecified site: Secondary | ICD-10-CM | POA: Diagnosis not present

## 2022-09-09 DIAGNOSIS — I2510 Atherosclerotic heart disease of native coronary artery without angina pectoris: Secondary | ICD-10-CM | POA: Diagnosis not present

## 2022-09-09 DIAGNOSIS — E875 Hyperkalemia: Secondary | ICD-10-CM | POA: Diagnosis not present

## 2022-09-09 DIAGNOSIS — Z9181 History of falling: Secondary | ICD-10-CM | POA: Diagnosis not present

## 2022-09-09 DIAGNOSIS — J9621 Acute and chronic respiratory failure with hypoxia: Secondary | ICD-10-CM | POA: Diagnosis not present

## 2022-09-09 DIAGNOSIS — I5042 Chronic combined systolic (congestive) and diastolic (congestive) heart failure: Secondary | ICD-10-CM | POA: Diagnosis not present

## 2022-09-09 DIAGNOSIS — I130 Hypertensive heart and chronic kidney disease with heart failure and stage 1 through stage 4 chronic kidney disease, or unspecified chronic kidney disease: Secondary | ICD-10-CM | POA: Diagnosis not present

## 2022-09-09 DIAGNOSIS — D509 Iron deficiency anemia, unspecified: Secondary | ICD-10-CM | POA: Diagnosis not present

## 2022-09-11 ENCOUNTER — Ambulatory Visit: Payer: Medicare Other | Admitting: Cardiology

## 2022-09-16 DIAGNOSIS — G4700 Insomnia, unspecified: Secondary | ICD-10-CM | POA: Diagnosis not present

## 2022-09-16 DIAGNOSIS — I2489 Other forms of acute ischemic heart disease: Secondary | ICD-10-CM | POA: Diagnosis not present

## 2022-09-16 DIAGNOSIS — Z8711 Personal history of peptic ulcer disease: Secondary | ICD-10-CM | POA: Diagnosis not present

## 2022-09-16 DIAGNOSIS — I130 Hypertensive heart and chronic kidney disease with heart failure and stage 1 through stage 4 chronic kidney disease, or unspecified chronic kidney disease: Secondary | ICD-10-CM | POA: Diagnosis not present

## 2022-09-16 DIAGNOSIS — I5042 Chronic combined systolic (congestive) and diastolic (congestive) heart failure: Secondary | ICD-10-CM | POA: Diagnosis not present

## 2022-09-16 DIAGNOSIS — T464X5D Adverse effect of angiotensin-converting-enzyme inhibitors, subsequent encounter: Secondary | ICD-10-CM | POA: Diagnosis not present

## 2022-09-16 DIAGNOSIS — N182 Chronic kidney disease, stage 2 (mild): Secondary | ICD-10-CM | POA: Diagnosis not present

## 2022-09-16 DIAGNOSIS — Z9981 Dependence on supplemental oxygen: Secondary | ICD-10-CM | POA: Diagnosis not present

## 2022-09-16 DIAGNOSIS — M1990 Unspecified osteoarthritis, unspecified site: Secondary | ICD-10-CM | POA: Diagnosis not present

## 2022-09-16 DIAGNOSIS — F17210 Nicotine dependence, cigarettes, uncomplicated: Secondary | ICD-10-CM | POA: Diagnosis not present

## 2022-09-16 DIAGNOSIS — I2510 Atherosclerotic heart disease of native coronary artery without angina pectoris: Secondary | ICD-10-CM | POA: Diagnosis not present

## 2022-09-16 DIAGNOSIS — D509 Iron deficiency anemia, unspecified: Secondary | ICD-10-CM | POA: Diagnosis not present

## 2022-09-16 DIAGNOSIS — Z9181 History of falling: Secondary | ICD-10-CM | POA: Diagnosis not present

## 2022-09-16 DIAGNOSIS — E875 Hyperkalemia: Secondary | ICD-10-CM | POA: Diagnosis not present

## 2022-09-16 DIAGNOSIS — J449 Chronic obstructive pulmonary disease, unspecified: Secondary | ICD-10-CM | POA: Diagnosis not present

## 2022-09-16 DIAGNOSIS — R1319 Other dysphagia: Secondary | ICD-10-CM | POA: Diagnosis not present

## 2022-09-16 DIAGNOSIS — T783XXD Angioneurotic edema, subsequent encounter: Secondary | ICD-10-CM | POA: Diagnosis not present

## 2022-09-16 DIAGNOSIS — E785 Hyperlipidemia, unspecified: Secondary | ICD-10-CM | POA: Diagnosis not present

## 2022-09-16 DIAGNOSIS — J9622 Acute and chronic respiratory failure with hypercapnia: Secondary | ICD-10-CM | POA: Diagnosis not present

## 2022-09-16 DIAGNOSIS — E1122 Type 2 diabetes mellitus with diabetic chronic kidney disease: Secondary | ICD-10-CM | POA: Diagnosis not present

## 2022-09-16 DIAGNOSIS — Z86010 Personal history of colonic polyps: Secondary | ICD-10-CM | POA: Diagnosis not present

## 2022-09-16 DIAGNOSIS — J9621 Acute and chronic respiratory failure with hypoxia: Secondary | ICD-10-CM | POA: Diagnosis not present

## 2022-09-21 DIAGNOSIS — R1319 Other dysphagia: Secondary | ICD-10-CM | POA: Diagnosis not present

## 2022-09-21 DIAGNOSIS — T464X5D Adverse effect of angiotensin-converting-enzyme inhibitors, subsequent encounter: Secondary | ICD-10-CM | POA: Diagnosis not present

## 2022-09-21 DIAGNOSIS — N182 Chronic kidney disease, stage 2 (mild): Secondary | ICD-10-CM | POA: Diagnosis not present

## 2022-09-21 DIAGNOSIS — G4700 Insomnia, unspecified: Secondary | ICD-10-CM | POA: Diagnosis not present

## 2022-09-21 DIAGNOSIS — E785 Hyperlipidemia, unspecified: Secondary | ICD-10-CM | POA: Diagnosis not present

## 2022-09-21 DIAGNOSIS — J9622 Acute and chronic respiratory failure with hypercapnia: Secondary | ICD-10-CM | POA: Diagnosis not present

## 2022-09-21 DIAGNOSIS — M1990 Unspecified osteoarthritis, unspecified site: Secondary | ICD-10-CM | POA: Diagnosis not present

## 2022-09-21 DIAGNOSIS — Z9181 History of falling: Secondary | ICD-10-CM | POA: Diagnosis not present

## 2022-09-21 DIAGNOSIS — Z8711 Personal history of peptic ulcer disease: Secondary | ICD-10-CM | POA: Diagnosis not present

## 2022-09-21 DIAGNOSIS — I2489 Other forms of acute ischemic heart disease: Secondary | ICD-10-CM | POA: Diagnosis not present

## 2022-09-21 DIAGNOSIS — E1122 Type 2 diabetes mellitus with diabetic chronic kidney disease: Secondary | ICD-10-CM | POA: Diagnosis not present

## 2022-09-21 DIAGNOSIS — T783XXD Angioneurotic edema, subsequent encounter: Secondary | ICD-10-CM | POA: Diagnosis not present

## 2022-09-21 DIAGNOSIS — I5042 Chronic combined systolic (congestive) and diastolic (congestive) heart failure: Secondary | ICD-10-CM | POA: Diagnosis not present

## 2022-09-21 DIAGNOSIS — Z9981 Dependence on supplemental oxygen: Secondary | ICD-10-CM | POA: Diagnosis not present

## 2022-09-21 DIAGNOSIS — I2510 Atherosclerotic heart disease of native coronary artery without angina pectoris: Secondary | ICD-10-CM | POA: Diagnosis not present

## 2022-09-21 DIAGNOSIS — E875 Hyperkalemia: Secondary | ICD-10-CM | POA: Diagnosis not present

## 2022-09-21 DIAGNOSIS — D509 Iron deficiency anemia, unspecified: Secondary | ICD-10-CM | POA: Diagnosis not present

## 2022-09-21 DIAGNOSIS — J9621 Acute and chronic respiratory failure with hypoxia: Secondary | ICD-10-CM | POA: Diagnosis not present

## 2022-09-21 DIAGNOSIS — I130 Hypertensive heart and chronic kidney disease with heart failure and stage 1 through stage 4 chronic kidney disease, or unspecified chronic kidney disease: Secondary | ICD-10-CM | POA: Diagnosis not present

## 2022-09-21 DIAGNOSIS — F17210 Nicotine dependence, cigarettes, uncomplicated: Secondary | ICD-10-CM | POA: Diagnosis not present

## 2022-09-21 DIAGNOSIS — Z86010 Personal history of colonic polyps: Secondary | ICD-10-CM | POA: Diagnosis not present

## 2022-09-21 DIAGNOSIS — J449 Chronic obstructive pulmonary disease, unspecified: Secondary | ICD-10-CM | POA: Diagnosis not present

## 2022-10-02 ENCOUNTER — Encounter (HOSPITAL_COMMUNITY): Payer: Self-pay | Admitting: Hematology

## 2022-10-05 ENCOUNTER — Encounter (HOSPITAL_COMMUNITY): Payer: Self-pay | Admitting: Hematology

## 2022-10-06 DIAGNOSIS — J449 Chronic obstructive pulmonary disease, unspecified: Secondary | ICD-10-CM | POA: Diagnosis not present

## 2022-10-07 ENCOUNTER — Encounter: Payer: Self-pay | Admitting: Cardiology

## 2022-10-07 ENCOUNTER — Ambulatory Visit: Payer: Medicare Other | Attending: Cardiology | Admitting: Cardiology

## 2022-10-07 VITALS — BP 130/74 | HR 77 | Ht 72.0 in | Wt 295.2 lb

## 2022-10-07 DIAGNOSIS — I2510 Atherosclerotic heart disease of native coronary artery without angina pectoris: Secondary | ICD-10-CM | POA: Diagnosis not present

## 2022-10-07 DIAGNOSIS — R600 Localized edema: Secondary | ICD-10-CM | POA: Diagnosis not present

## 2022-10-07 DIAGNOSIS — E782 Mixed hyperlipidemia: Secondary | ICD-10-CM

## 2022-10-07 DIAGNOSIS — I5022 Chronic systolic (congestive) heart failure: Secondary | ICD-10-CM

## 2022-10-07 MED ORDER — ROSUVASTATIN CALCIUM 40 MG PO TABS: 40.0000 mg | ORAL_TABLET | Freq: Every day | ORAL | 3 refills | Status: DC

## 2022-10-07 NOTE — Patient Instructions (Signed)
Medication Instructions:  Your physician has recommended you make the following change in your medication:  -Stop Atorvastatin (Lipitor) -Start Rosuvastatin (Crestor) 40 mg tablets once daily    Labwork: None  Testing/Procedures: Your physician has requested that you have an echocardiogram. Echocardiography is a painless test that uses sound waves to create images of your heart. It provides your doctor with information about the size and shape of your heart and how well your heart's chambers and valves are working. This procedure takes approximately one hour. There are no restrictions for this procedure. Please do NOT wear cologne, perfume, aftershave, or lotions (deodorant is allowed). Please arrive 15 minutes prior to your appointment time.   Follow-Up: Follow up with Dr. Harl Bowie in 4 months.   Any Other Special Instructions Will Be Listed Below (If Applicable).     If you need a refill on your cardiac medications before your next appointment, please call your pharmacy.

## 2022-10-07 NOTE — Progress Notes (Signed)
Clinical Summary Mr. Sagar is a 72 y.o.male seen today for a focused visit for history of CAD and chronic sysotlic HF   1. CAD/ICM/Chronic systolic HF - from notes initially chest pain and dyspnea, became unresponsive en route to ED - intubated in ER. Notes mention issues with COPD exacerbation vs aspiration event, no mention of a cardiac arrest - diagnosed with NSTEMI during admission, had cath and 3 stents. - admitted 12/29 to Jan 9   09/12/20 cath: LM LIs, LAD prox 70% and 90% mid, LCX 80% prox with thrombus, OM2 85%, RCA small non dom 60%. LVEDP elevated but number not given, - received DES to prox/mid LAD, DES to prox LCX, DES to OM2. 08/2020 echo: LVEF 35-40% grade II DDx, mild MR        12/2020 echo: LVEF 60-65%, no WMAs, indet diastolic, normal RV   57/8469 LVEF 40-45% - this was in setting of post respiratory arrest.  - angioedema and respiratory failure on ACEi   2. COPD - severe COPD exacerbation during jan 2022 admission in [place], [place] requiring intubation - on home O2 2 L     3. LE edema - cramps on both daily lasix and torsemide, even with low dosing and every other day dosing.   - ongonig swelling, takes lasix '40mg'$  every other day.     4. Anemia - admission 12/2020, Hgb was 6.4 - transfused 2 units   5. Respiratory failure - admit 07/2022, respiratory driven cardiac arrest in setting of COPD and ACEi angioedema, required intubation -    6. Hyperlipidemia - 07/2022 TC 197 TG 394 HDL 28 LDL 102 Past Medical History:  Diagnosis Date   Anemia    Arthritis    left shoulder   COPD (chronic obstructive pulmonary disease) (HCC)    Coronary artery disease 09/11/2020   NSTEMI and 3 stents in [facility]   Diabetes mellitus without complication ([place])    Dyslipidemia    Hypertension    Insomnia    Insomnia    Morbid obesity (HCC)    Tobacco use      Allergies  Allergen Reactions   Lisinopril Swelling    ACEi angioedema requiring  intubation 07/2022   Caduet [Amlodipine-Atorvastatin] Swelling     Current Outpatient Medications  Medication Sig Dispense Refill   acetaminophen (TYLENOL) 500 MG tablet Take 1,000 mg by mouth every 6 (six) hours as needed for mild pain.     albuterol (VENTOLIN HFA) 108 (90 Base) MCG/ACT inhaler Inhale 2 puffs into the lungs every 4 (four) hours as needed for wheezing or shortness of breath.     atorvastatin (LIPITOR) 80 MG tablet Take 80 mg by mouth daily.     bacitracin ointment Apply topically 2 (two) times daily. 120 g 0   cyanocobalamin (,VITAMIN B-12,) 1000 MCG/ML injection Inject 1,000 mcg into the muscle every 30 (thirty) days.     diphenhydrAMINE (BENADRYL) 25 mg capsule Take 25 mg by mouth once.     diphenhydramine-acetaminophen (TYLENOL PM) 25-500 MG TABS tablet Take 1 tablet by mouth at bedtime as needed (sleep).     empagliflozin (JARDIANCE) 10 MG TABS tablet Take 1 tablet (10 mg total) by mouth daily. 30 tablet 0   ferrous sulfate 325 (65 FE) MG tablet Take 1 tablet (325 mg total) by mouth 2 (two) times daily with a meal. 60 tablet 3   furosemide (LASIX) 40 MG tablet Take 0.5 tablets (20 mg total) by mouth 2 (  two) times daily. 30 tablet 0   hydrocortisone (ANUSOL-HC) 2.5 % rectal cream Place rectally 2 (two) times daily. 30 g 0   insulin NPH-regular Human (NOVOLIN 70/30) (70-30) 100 UNIT/ML injection Inject 40 Units into the skin in the morning and at bedtime.     Ipratropium-Albuterol (COMBIVENT RESPIMAT) 20-100 MCG/ACT AERS respimat Inhale 1 puff into the lungs every 6 (six) hours as needed for wheezing or shortness of breath. 4 g 1   levocetirizine (XYZAL) 5 MG tablet Take 5 mg by mouth at bedtime.     metoprolol succinate (TOPROL-XL) 50 MG 24 hr tablet Take 3 tablets (150 mg total) by mouth daily. 90 tablet 0   nitroGLYCERIN (NITROSTAT) 0.4 MG SL tablet DISSOLVE 1 TABLET SUBLINGUALLY AS NEEDED FOR CHEST PAIN, MAY REPEAT EVERY 5 MINUTES. AFTER 3 CALL 911. (Patient taking  differently: Place 0.4 mg under the tongue every 5 (five) minutes as needed for chest pain.) 25 tablet 3   pantoprazole (PROTONIX) 40 MG tablet Take 40 mg by mouth daily.     Potassium 99 MG TABS Take 99 mg by mouth daily as needed (cramps).     traMADol (ULTRAM) 50 MG tablet Take 50-100 mg by mouth in the morning and at bedtime.     zolpidem (AMBIEN) 5 MG tablet Take 5 mg by mouth at bedtime as needed for sleep.     No current facility-administered medications for this visit.     Past Surgical History:  Procedure Laterality Date   BIOPSY  10/28/2019   Procedure: BIOPSY;  Surgeon: Danie Binder, MD;  [facility];  Service: Endoscopy;;  gastric   BIOPSY  04/15/2020   Procedure: BIOPSY;  Surgeon: Daneil Dolin, MD;  [facility];  Service: Endoscopy;;  gastric   CARDIAC CATHETERIZATION  08/2020   3 stents placed   COLON RESECTION  1990s?   DIVERTICULITIS   COLONOSCOPY WITH PROPOFOL N/A 04/15/2020   Rourk: Poor colon prep. two 6 to 8 mm polyps removed from the hepatic flexure, tubular adenomas.  Repeat colonoscopy later this year.   ENTEROSCOPY N/A 11/26/2021   Procedure: ENTEROSCOPY;  Surgeon: Harvel Quale, MD;  [facility];  Service: Gastroenterology;  Laterality: N/A;   ENTEROSCOPY N/A 03/13/2022   Procedure: ENTEROSCOPY;  Surgeon: [name], DO;  [facility];  Service: Endoscopy;  Laterality: N/A;   ESOPHAGOGASTRODUODENOSCOPY (EGD) WITH PROPOFOL N/A 10/28/2019   Dr. Oneida Alar: Multiple cratered gastric ulcers, gastritis/duodenitis.  Path showed H. pylori.  Patient treated with amoxicillin/Biaxin/PPI twice daily.   ESOPHAGOGASTRODUODENOSCOPY (EGD) WITH PROPOFOL N/A 04/15/2020   Rourk: Patchy gastric erythema status post biopsy to document eradication of H. pylori.  Biopsy showed persistent H. pylori.  Previously noted gastric ulcer is completely healed.   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   ESOPHAGOGASTRODUODENOSCOPY (EGD) WITH PROPOFOL N/A 03/13/2022   Procedure: ESOPHAGOGASTRODUODENOSCOPY (EGD) WITH PROPOFOL;  Surgeon: [name], DO;  [facility];  Service: Endoscopy;  Laterality: N/A;   HERNIA REPAIR     UHR   HOT HEMOSTASIS  11/26/2021   Procedure: HOT HEMOSTASIS (ARGON PLASMA COAGULATION/BICAP);  Surgeon: Montez Morita, Quillian Quince, MD;  [facility];  Service: Gastroenterology;;   Venia Minks DILATION N/A 04/15/2020   Procedure: Keturah Shavers;  Surgeon: Daneil Dolin, MD;  [facility];  Service: Endoscopy;  Laterality: N/A;  POLYPECTOMY  04/15/2020   Procedure: POLYPECTOMY;  Surgeon: Daneil Dolin, MD;  [facility];  Service: Endoscopy;;  colon   ROTATOR CUFF REPAIR Bilateral    TYMPANOPLASTY       Allergies  Allergen Reactions   Lisinopril Swelling    ACEi angioedema requiring intubation 07/2022   Caduet [Amlodipine-Atorvastatin] Swelling      Family History  Problem Relation Age of Onset   Diabetes Mother    Heart attack Mother    Heart attack Father    Heart attack Brother    Colon cancer Neg Hx    Stomach cancer Neg Hx      Social History Mr. Whisenant reports that he has been smoking cigarettes. He has been smoking an average of 2 packs per day. He has never used smokeless tobacco. Mr. Dimichele reports current alcohol use.   Review of Systems CONSTITUTIONAL: No weight loss, fever, chills, weakness or fatigue.  HEENT: Eyes: No visual loss, blurred vision, double vision or yellow sclerae.No hearing loss, sneezing, congestion, runny nose or sore throat.  SKIN: No rash or itching.  CARDIOVASCULAR: per hpi RESPIRATORY: No shortness of breath, cough or sputum.  GASTROINTESTINAL: No anorexia, nausea, vomiting or diarrhea. No abdominal pain or blood.  GENITOURINARY: No burning on urination, no polyuria NEUROLOGICAL: No headache,  dizziness, syncope, paralysis, ataxia, numbness or tingling in the extremities. No change in bowel or bladder control.  MUSCULOSKELETAL: No muscle, back pain, joint pain or stiffness.  LYMPHATICS: No enlarged nodes. No history of splenectomy.  PSYCHIATRIC: No history of depression or anxiety.  ENDOCRINOLOGIC: No reports of sweating, cold or heat intolerance. No polyuria or polydipsia.  Marland Kitchen   Physical Examination Today's Vitals   10/07/22 1057 10/07/22 1112  BP: 130/74   Pulse: 77   SpO2: (!) 85% 100%  Weight: 295 lb 3.2 oz (133.9 kg)   Height: 6' (1.829 m)    Body mass index is 40.04 kg/m.  Gen: resting comfortably, no acute distress HEENT: no scleral icterus, pupils equal round and reactive, no palptable cervical adenopathy,  CV: RRR, no m/r/g no jvd Resp: Clear to auscultation bilaterally GI: abdomen is soft, non-tender, non-distended, normal bowel sounds, no hepatosplenomegaly MSK: extremities are warm, no edema.  Skin: warm, no rash Neuro:  no focal deficits Psych: appropriate affect   Diagnostic Studies 08/2018 echo Study Conclusions   - Left ventricle: The cavity size was normal. Wall thickness was   increased increased in a pattern of mild to moderate LVH.   Systolic function was mildly to moderately reduced. The estimated   ejection fraction was in the range of 40% to 45%. Diffuse   hypokinesis. Doppler parameters are consistent with abnormal left   ventricular relaxation (grade 1 diastolic dysfunction). Doppler   parameters are consistent with high ventricular filling pressure. - Aortic valve: Valve area (VTI): 3.22 cm^2. Valve area (Vmax):   2.74 cm^2. Valve area (Vmean): 2.29 cm^2. - Left atrium: The atrium was moderately dilated. - Atrial septum: No defect or patent foramen ovale was identified. - Techncically difficult study, recommend limited study with   echocontrast to better clarify LVEF. Based on available images   appears mildly decreased.   08/2018  contrast echo Study Conclusions   - Left ventricle: Limited study with Definity contrast. Systolic   function was normal. The estimated ejection fraction was in the   range of 55% to 60%. Wall motion was normal; there were no   regional wall motion abnormalities. - Mitral valve:  There was trivial regurgitation. - Pericardium, extracardiac: A prominent pericardial fat pad was   present.   07/2020 echo IMPRESSIONS     1. Left ventricular ejection fraction, by estimation, is 55 to 60%. The  left ventricle has normal function. The left ventricle has no regional  wall motion abnormalities. There is moderate left ventricular hypertrophy.  Left ventricular diastolic  parameters are indeterminate.   2. Right ventricular systolic function is normal. The right ventricular  size is normal.   3. The mitral valve is normal in structure. Mild mitral valve  regurgitation. No evidence of mitral stenosis.   4. The aortic valve is tricuspid. There is mild calcification of the  aortic valve. There is mild thickening of the aortic valve. Aortic valve  regurgitation is not visualized. No aortic stenosis is present.   5. The inferior vena cava is normal in size with greater than 50%  respiratory variability, suggesting right atrial pressure of 3 mmHg.     12/2020 echo IMPRESSIONS     1. Left ventricular ejection fraction, by estimation, is 60 to 65%. The  left ventricle has normal function. The left ventricle has no regional  wall motion abnormalities. There is mild left ventricular hypertrophy.  Left ventricular diastolic parameters  are indeterminate. Elevated left atrial pressure.   2. Right ventricular systolic function is normal. The right ventricular  size is normal.   3. Left atrial size was severely dilated.   4. Right atrial size was mildly dilated.   5. The mitral valve is normal in structure. Mild mitral valve  regurgitation. No evidence of mitral stenosis.   6. The aortic valve is  tricuspid. Aortic valve regurgitation is not  visualized. No aortic stenosis is present.   7. The inferior vena cava is dilated in size with >50% respiratory  variability, suggesting right atrial pressure of 8 mmHg.     Assessment and Plan    1. CAD/ICM/Chronic systolic HF - no symptoms - recurrent drop in LVEF during admission with respiratory failure with associated cardiac arrest - will repeat echo, continue current meds - severe ACEi allergy with angioedema with intubation, not a candidate for ACE or ARNI. Though low crossreactivity I would be hesitant for ARB as well    2.Hyperlipidemia - not at goal, continue current meds  3. LE edema - controlled, continue lasix  Arnoldo Lenis, M.D.

## 2022-10-13 DIAGNOSIS — Z8711 Personal history of peptic ulcer disease: Secondary | ICD-10-CM | POA: Diagnosis not present

## 2022-10-13 DIAGNOSIS — Z9981 Dependence on supplemental oxygen: Secondary | ICD-10-CM | POA: Diagnosis not present

## 2022-10-13 DIAGNOSIS — M1990 Unspecified osteoarthritis, unspecified site: Secondary | ICD-10-CM | POA: Diagnosis not present

## 2022-10-13 DIAGNOSIS — D509 Iron deficiency anemia, unspecified: Secondary | ICD-10-CM | POA: Diagnosis not present

## 2022-10-13 DIAGNOSIS — J9622 Acute and chronic respiratory failure with hypercapnia: Secondary | ICD-10-CM | POA: Diagnosis not present

## 2022-10-13 DIAGNOSIS — E875 Hyperkalemia: Secondary | ICD-10-CM | POA: Diagnosis not present

## 2022-10-13 DIAGNOSIS — J9621 Acute and chronic respiratory failure with hypoxia: Secondary | ICD-10-CM | POA: Diagnosis not present

## 2022-10-13 DIAGNOSIS — I2489 Other forms of acute ischemic heart disease: Secondary | ICD-10-CM | POA: Diagnosis not present

## 2022-10-13 DIAGNOSIS — E785 Hyperlipidemia, unspecified: Secondary | ICD-10-CM | POA: Diagnosis not present

## 2022-10-13 DIAGNOSIS — N182 Chronic kidney disease, stage 2 (mild): Secondary | ICD-10-CM | POA: Diagnosis not present

## 2022-10-13 DIAGNOSIS — I5042 Chronic combined systolic (congestive) and diastolic (congestive) heart failure: Secondary | ICD-10-CM | POA: Diagnosis not present

## 2022-10-13 DIAGNOSIS — G4700 Insomnia, unspecified: Secondary | ICD-10-CM | POA: Diagnosis not present

## 2022-10-13 DIAGNOSIS — I2510 Atherosclerotic heart disease of native coronary artery without angina pectoris: Secondary | ICD-10-CM | POA: Diagnosis not present

## 2022-10-13 DIAGNOSIS — E1122 Type 2 diabetes mellitus with diabetic chronic kidney disease: Secondary | ICD-10-CM | POA: Diagnosis not present

## 2022-10-13 DIAGNOSIS — F17210 Nicotine dependence, cigarettes, uncomplicated: Secondary | ICD-10-CM | POA: Diagnosis not present

## 2022-10-13 DIAGNOSIS — Z86010 Personal history of colonic polyps: Secondary | ICD-10-CM | POA: Diagnosis not present

## 2022-10-13 DIAGNOSIS — T783XXD Angioneurotic edema, subsequent encounter: Secondary | ICD-10-CM | POA: Diagnosis not present

## 2022-10-13 DIAGNOSIS — I130 Hypertensive heart and chronic kidney disease with heart failure and stage 1 through stage 4 chronic kidney disease, or unspecified chronic kidney disease: Secondary | ICD-10-CM | POA: Diagnosis not present

## 2022-10-13 DIAGNOSIS — R1319 Other dysphagia: Secondary | ICD-10-CM | POA: Diagnosis not present

## 2022-10-13 DIAGNOSIS — T464X5D Adverse effect of angiotensin-converting-enzyme inhibitors, subsequent encounter: Secondary | ICD-10-CM | POA: Diagnosis not present

## 2022-10-13 DIAGNOSIS — J449 Chronic obstructive pulmonary disease, unspecified: Secondary | ICD-10-CM | POA: Diagnosis not present

## 2022-10-13 DIAGNOSIS — Z9181 History of falling: Secondary | ICD-10-CM | POA: Diagnosis not present

## 2022-10-22 ENCOUNTER — Ambulatory Visit (HOSPITAL_COMMUNITY)
Admission: RE | Admit: 2022-10-22 | Discharge: 2022-10-22 | Disposition: A | Discharge status: Home / Self Care | Payer: Medicare Other | Source: Ambulatory Visit | Attending: Cardiology | Admitting: Cardiology

## 2022-10-22 DIAGNOSIS — I5022 Chronic systolic (congestive) heart failure: Secondary | ICD-10-CM | POA: Insufficient documentation

## 2022-10-22 LAB — ECHOCARDIOGRAM COMPLETE
AR max vel: 2.17 cm2
AV Area VTI: 2.43 cm2
AV Area mean vel: 2.27 cm2
AV Mean grad: 4.1 mmHg
AV Peak grad: 9.2 mmHg
Ao pk vel: 1.52 m/s
Area-P 1/2: 2.56 cm2
S' Lateral: 4 cm

## 2022-10-22 MED ORDER — PERFLUTREN LIPID MICROSPHERE
1.0000 mL | INTRAVENOUS | Status: AC | PRN
  Administered 2022-10-22: 3 mL via INTRAVENOUS

## 2022-10-22 NOTE — Progress Notes (Signed)
*  PRELIMINARY RESULTS* Echocardiogram 2D Echocardiogram has been performed with Definity.  Gregory Alvarado 10/22/2022, 11:37 AM

## 2022-11-06 DIAGNOSIS — J449 Chronic obstructive pulmonary disease, unspecified: Secondary | ICD-10-CM | POA: Diagnosis not present

## 2022-11-11 ENCOUNTER — Telehealth: Payer: Self-pay

## 2022-11-11 NOTE — Telephone Encounter (Signed)
Patient notified and verbalized understanding. Patient had no questions or concerns at this time. PCP copied.  

## 2022-11-11 NOTE — Telephone Encounter (Signed)
-----   Message from Arnoldo Lenis, MD sent at 11/11/2022  4:26 PM EST ----- Heart function has improved and is back to normal  Zandra Abts MD

## 2022-11-17 ENCOUNTER — Encounter (HOSPITAL_COMMUNITY): Payer: Self-pay | Admitting: Hematology

## 2022-11-25 DIAGNOSIS — E1169 Type 2 diabetes mellitus with other specified complication: Secondary | ICD-10-CM | POA: Diagnosis not present

## 2022-11-25 DIAGNOSIS — E782 Mixed hyperlipidemia: Secondary | ICD-10-CM | POA: Diagnosis not present

## 2022-11-27 DIAGNOSIS — E1169 Type 2 diabetes mellitus with other specified complication: Secondary | ICD-10-CM | POA: Diagnosis not present

## 2022-12-01 DIAGNOSIS — K922 Gastrointestinal hemorrhage, unspecified: Secondary | ICD-10-CM | POA: Diagnosis not present

## 2022-12-01 DIAGNOSIS — M79604 Pain in right leg: Secondary | ICD-10-CM | POA: Diagnosis not present

## 2022-12-01 DIAGNOSIS — E1169 Type 2 diabetes mellitus with other specified complication: Secondary | ICD-10-CM | POA: Diagnosis not present

## 2022-12-01 DIAGNOSIS — J449 Chronic obstructive pulmonary disease, unspecified: Secondary | ICD-10-CM | POA: Diagnosis not present

## 2022-12-01 DIAGNOSIS — I110 Hypertensive heart disease with heart failure: Secondary | ICD-10-CM | POA: Diagnosis not present

## 2022-12-01 DIAGNOSIS — E782 Mixed hyperlipidemia: Secondary | ICD-10-CM | POA: Diagnosis not present

## 2022-12-01 DIAGNOSIS — G629 Polyneuropathy, unspecified: Secondary | ICD-10-CM | POA: Diagnosis not present

## 2022-12-01 DIAGNOSIS — I2510 Atherosclerotic heart disease of native coronary artery without angina pectoris: Secondary | ICD-10-CM | POA: Diagnosis not present

## 2022-12-01 DIAGNOSIS — I5042 Chronic combined systolic (congestive) and diastolic (congestive) heart failure: Secondary | ICD-10-CM | POA: Diagnosis not present

## 2022-12-01 DIAGNOSIS — D509 Iron deficiency anemia, unspecified: Secondary | ICD-10-CM | POA: Diagnosis not present

## 2022-12-01 DIAGNOSIS — E1151 Type 2 diabetes mellitus with diabetic peripheral angiopathy without gangrene: Secondary | ICD-10-CM | POA: Diagnosis not present

## 2022-12-01 DIAGNOSIS — J9611 Chronic respiratory failure with hypoxia: Secondary | ICD-10-CM | POA: Diagnosis not present

## 2022-12-02 ENCOUNTER — Encounter: Payer: Self-pay | Admitting: Internal Medicine

## 2022-12-04 ENCOUNTER — Encounter (HOSPITAL_COMMUNITY): Payer: Self-pay | Admitting: Hematology

## 2022-12-05 DIAGNOSIS — J449 Chronic obstructive pulmonary disease, unspecified: Secondary | ICD-10-CM | POA: Diagnosis not present

## 2023-01-05 DIAGNOSIS — J449 Chronic obstructive pulmonary disease, unspecified: Secondary | ICD-10-CM | POA: Diagnosis not present

## 2023-02-04 DIAGNOSIS — J449 Chronic obstructive pulmonary disease, unspecified: Secondary | ICD-10-CM | POA: Diagnosis not present

## 2023-03-07 DIAGNOSIS — J449 Chronic obstructive pulmonary disease, unspecified: Secondary | ICD-10-CM | POA: Diagnosis not present

## 2023-03-26 DIAGNOSIS — E782 Mixed hyperlipidemia: Secondary | ICD-10-CM | POA: Diagnosis not present

## 2023-03-26 DIAGNOSIS — D509 Iron deficiency anemia, unspecified: Secondary | ICD-10-CM | POA: Diagnosis not present

## 2023-03-26 DIAGNOSIS — E1169 Type 2 diabetes mellitus with other specified complication: Secondary | ICD-10-CM | POA: Diagnosis not present

## 2023-03-31 ENCOUNTER — Encounter (HOSPITAL_COMMUNITY): Payer: Self-pay | Admitting: Hematology

## 2023-04-01 ENCOUNTER — Encounter: Payer: Self-pay | Admitting: Internal Medicine

## 2023-04-01 DIAGNOSIS — E1169 Type 2 diabetes mellitus with other specified complication: Secondary | ICD-10-CM | POA: Diagnosis not present

## 2023-04-01 DIAGNOSIS — I2510 Atherosclerotic heart disease of native coronary artery without angina pectoris: Secondary | ICD-10-CM | POA: Diagnosis not present

## 2023-04-01 DIAGNOSIS — I25118 Atherosclerotic heart disease of native coronary artery with other forms of angina pectoris: Secondary | ICD-10-CM | POA: Diagnosis not present

## 2023-04-01 DIAGNOSIS — J449 Chronic obstructive pulmonary disease, unspecified: Secondary | ICD-10-CM | POA: Diagnosis not present

## 2023-04-01 DIAGNOSIS — K922 Gastrointestinal hemorrhage, unspecified: Secondary | ICD-10-CM | POA: Diagnosis not present

## 2023-04-01 DIAGNOSIS — T464X5D Adverse effect of angiotensin-converting-enzyme inhibitors, subsequent encounter: Secondary | ICD-10-CM | POA: Diagnosis not present

## 2023-04-01 DIAGNOSIS — J9611 Chronic respiratory failure with hypoxia: Secondary | ICD-10-CM | POA: Diagnosis not present

## 2023-04-01 DIAGNOSIS — E1151 Type 2 diabetes mellitus with diabetic peripheral angiopathy without gangrene: Secondary | ICD-10-CM | POA: Diagnosis not present

## 2023-04-01 DIAGNOSIS — E782 Mixed hyperlipidemia: Secondary | ICD-10-CM | POA: Diagnosis not present

## 2023-04-01 DIAGNOSIS — I110 Hypertensive heart disease with heart failure: Secondary | ICD-10-CM | POA: Diagnosis not present

## 2023-04-01 DIAGNOSIS — I5042 Chronic combined systolic (congestive) and diastolic (congestive) heart failure: Secondary | ICD-10-CM | POA: Diagnosis not present

## 2023-04-06 DIAGNOSIS — J449 Chronic obstructive pulmonary disease, unspecified: Secondary | ICD-10-CM | POA: Diagnosis not present

## 2023-04-30 ENCOUNTER — Encounter (HOSPITAL_COMMUNITY): Payer: Self-pay | Admitting: Hematology

## 2023-05-06 NOTE — Progress Notes (Signed)
Clinical Summary Gregory Alvarado is a 72 y.o.male seen today for a focused visit for history of CAD and chronic sysotlic HF    1. CAD/ICM/Chronic systolic HF - from notes initially chest pain and dyspnea, became unresponsive en route to ED - intubated in ER. Notes mention issues with COPD exacerbation vs aspiration event, no mention of a cardiac arrest - diagnosed with NSTEMI during admission, had cath and 3 stents. - admitted 12/29 to Jan 9   09/12/20 cath: LM LIs, LAD prox 70% and 90% mid, LCX 80% prox with thrombus, OM2 85%, RCA small non dom 60%. LVEDP elevated but number not given, - received DES to prox/mid LAD, DES to prox LCX, DES to OM2. 08/2020 echo: LVEF 35-40% grade II DDx, mild MR        12/2020 echo: LVEF 60-65%, no WMAs, indet diastolic, normal RV   07/2022 LVEF 40-45% - this was in setting of post respiratory arrest.  - angioedema and respiratory failure on ACEi  10/2022 echo LVEF 50-55%, apex akinetic - no chest pain, chronic stable SOB.  - compliant with meds. Muscle cramps on lasix stopped taking, will take just prn.    2. COPD - severe COPD exacerbation during jan 2022 admission in [place], [place] requiring intubation - on home O2 2 L     3. LE edema - cramps on both daily lasix and torsemide - taking lasix just prn       4. 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 Respiratory failure - admit 07/2022, respiratory driven cardiac arrest in setting of COPD and ACEi angioedema, required intubation -      6. Hyperlipidemia - 07/2022 TC 197 TG 394 HDL 28 LDL 102 - 02/2023 TC 89 TG 409 HDL 39 LDL 29  Past Medical History:  Diagnosis Date   Anemia    Arthritis    left shoulder   COPD (chronic obstructive pulmonary disease) (HCC)    Coronary artery disease 09/11/2020   NSTEMI and 3 stents  in [facility]   Diabetes mellitus without complication (HCC)    Dyslipidemia    Hypertension    Insomnia    Insomnia    Morbid obesity (HCC)    Tobacco use      Allergies  Allergen Reactions   Lisinopril Swelling    ACEi angioedema requiring intubation 07/2022   Caduet [Amlodipine-Atorvastatin] Swelling     Current Outpatient Medications  Medication Sig Dispense Refill   acetaminophen (TYLENOL) 500 MG tablet Take 1,000 mg by mouth every 6 (six) hours as needed for mild pain.     albuterol (VENTOLIN HFA) 108 (90 Base) MCG/ACT inhaler Inhale 2 puffs into the lungs every 4 (four) hours as needed for wheezing or shortness of breath.     bacitracin ointment Apply topically 2 (two) times daily. 120 g 0   cyanocobalamin (,VITAMIN B-12,) 1000 MCG/ML injection Inject 1,000 mcg into the muscle every 30 (thirty) days.     diphenhydrAMINE (BENADRYL) 25 mg capsule Take 25 mg by mouth once.     diphenhydramine-acetaminophen (TYLENOL PM) 25-500 MG TABS tablet Take 1 tablet by mouth at bedtime as needed (sleep).     empagliflozin (JARDIANCE) 10 MG TABS tablet Take 1 tablet (10 mg total) by mouth daily. 30 tablet 0   ferrous sulfate 325 (65  FE) MG tablet Take 1 tablet (325 mg total) by mouth 2 (two) times daily with a meal. 60 tablet 3   hydrocortisone (ANUSOL-HC) 2.5 % rectal cream Place rectally 2 (two) times daily. 30 g 0   insulin NPH-regular Human (NOVOLIN 70/30) (70-30) 100 UNIT/ML injection Inject 40 Units into the skin in the morning and at bedtime.     Ipratropium-Albuterol (COMBIVENT RESPIMAT) 20-100 MCG/ACT AERS respimat Inhale 1 puff into the lungs every 6 (six) hours as needed for wheezing or shortness of breath. 4 g 1   levocetirizine (XYZAL) 5 MG tablet Take 5 mg by mouth at bedtime.     nitroGLYCERIN (NITROSTAT) 0.4 MG SL tablet DISSOLVE 1 TABLET SUBLINGUALLY AS NEEDED FOR CHEST PAIN, MAY REPEAT EVERY 5 MINUTES. AFTER 3 CALL 911. (Patient taking differently: Place 0.4 mg under the  tongue every 5 (five) minutes as needed for chest pain.) 25 tablet 3   pantoprazole (PROTONIX) 40 MG tablet Take 40 mg by mouth daily.     traMADol (ULTRAM) 50 MG tablet Take 50-100 mg by mouth in the morning and at bedtime.     zolpidem (AMBIEN) 5 MG tablet Take 5 mg by mouth at bedtime as needed for sleep.     furosemide (LASIX) 40 MG tablet Take 0.5 tablets (20 mg total) by mouth daily as needed for fluid or edema.     metoprolol succinate (TOPROL-XL) 50 MG 24 hr tablet Take 3 tablets (150 mg total) by mouth daily. 90 tablet 3   Potassium 99 MG TABS Take 99 mg by mouth daily as needed (cramps). (Patient not taking: Reported on 05/07/2023)     rosuvastatin (CRESTOR) 40 MG tablet Take 1 tablet (40 mg total) by mouth daily. 90 tablet 3   No current facility-administered medications for this visit.     Past Surgical History:  Procedure Laterality Date   BIOPSY  10/28/2019   Procedure: BIOPSY;  Surgeon: West Bali, MD;  [facility];  Service: Endoscopy;;  gastric   BIOPSY  04/15/2020   Procedure: BIOPSY;  Surgeon: Corbin Ade, MD;  [facility];  Service: Endoscopy;;  gastric   CARDIAC CATHETERIZATION  08/2020   3 stents placed   COLON RESECTION  1990s?   DIVERTICULITIS   COLONOSCOPY WITH PROPOFOL N/A 04/15/2020   Rourk: Poor colon prep. two 6 to 8 mm polyps removed from the hepatic flexure, tubular adenomas.  Repeat colonoscopy later this year.   ENTEROSCOPY N/A 11/26/2021   Procedure: ENTEROSCOPY;  Surgeon: Dolores Frame, MD;  [facility]: [facility];  Service: Gastroenterology;  Laterality: N/A;   ENTEROSCOPY N/A 03/13/2022   Procedure: ENTEROSCOPY;  Surgeon: Lanelle Bal, DO;  [facility];  Service: Endoscopy;  Laterality: N/A;   ESOPHAGOGASTRODUODENOSCOPY (EGD) WITH PROPOFOL N/A 10/28/2019   Dr. Darrick Penna: Multiple cratered gastric ulcers, gastritis/duodenitis.  Path showed H. pylori.  Patient treated with amoxicillin/Biaxin/PPI twice  daily.   ESOPHAGOGASTRODUODENOSCOPY (EGD) WITH PROPOFOL N/A 04/15/2020   Rourk: Patchy gastric erythema status post biopsy to document eradication of H. pylori.  Biopsy showed persistent H. pylori.  Previously noted gastric ulcer is completely healed.   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   ESOPHAGOGASTRODUODENOSCOPY (EGD) WITH PROPOFOL N/A 03/13/2022   Procedure: ESOPHAGOGASTRODUODENOSCOPY (EGD) WITH PROPOFOL;  Surgeon: Lanelle Bal, DO;  [facility];  Service: Endoscopy;  Laterality: N/A;   HERNIA  REPAIR     UHR   HOT HEMOSTASIS  11/26/2021   Procedure: HOT HEMOSTASIS (ARGON PLASMA COAGULATION/BICAP);  Surgeon: Marguerita Merles, Reuel Boom, MD;  [facility];  Service: Gastroenterology;;   Elease Hashimoto DILATION N/A 04/15/2020   Procedure: Alvy Beal;  Surgeon: Corbin Ade, MD;  [facility];  Service: Endoscopy;  Laterality: N/A;   POLYPECTOMY  04/15/2020   Procedure: POLYPECTOMY;  Surgeon: Corbin Ade, MD;  [facility];  Service: Endoscopy;;  colon   ROTATOR CUFF REPAIR Bilateral    TYMPANOPLASTY       Allergies  Allergen Reactions   Lisinopril Swelling    ACEi angioedema requiring intubation 07/2022   Caduet [Amlodipine-Atorvastatin] Swelling      Family History  Problem Relation Age of Onset   Diabetes Mother    Heart attack Mother    Heart attack Father    Heart attack Brother    Colon cancer Neg Hx    Stomach cancer Neg Hx      Social History Mr. Felland reports that he has been smoking cigarettes. He has never used smokeless tobacco. Mr. Giammanco reports that he does not currently use alcohol.   Review of Systems CONSTITUTIONAL: No weight loss, fever, chills, weakness or fatigue.  HEENT: Eyes: No visual loss, blurred vision, double vision or yellow sclerae.No hearing loss, sneezing, congestion, runny nose  or sore throat.  SKIN: No rash or itching.  CARDIOVASCULAR: per hpi RESPIRATORY: per hpi GASTROINTESTINAL: No anorexia, nausea, vomiting or diarrhea. No abdominal pain or blood.  GENITOURINARY: No burning on urination, no polyuria NEUROLOGICAL: No headache, dizziness, syncope, paralysis, ataxia, numbness or tingling in the extremities. No change in bowel or bladder control.  MUSCULOSKELETAL: No muscle, back pain, joint pain or stiffness.  LYMPHATICS: No enlarged nodes. No history of splenectomy.  PSYCHIATRIC: No history of depression or anxiety.  ENDOCRINOLOGIC: No reports of sweating, cold or heat intolerance. No polyuria or polydipsia.  Marland Kitchen   Physical Examination Today's Vitals   05/07/23 1115 05/07/23 1147  BP: (!) 140/48 (!) 130/50  Pulse: 100   SpO2: 90%   Weight: 295 lb (133.8 kg)   Height: 6' (1.829 m)    Body mass index is 40.01 kg/m.  Gen: resting comfortably, no acute distress HEENT: no scleral icterus, pupils equal round and reactive, no palptable cervical adenopathy,  CV: RR, no m/r,g no jvd Resp: bilateral wheezing GI: abdomen is soft, non-tender, non-distended, normal bowel sounds, no hepatosplenomegaly MSK: extremities are warm, no edema.  Skin: warm, no rash Neuro:  no focal deficits Psych: appropriate affect   Diagnostic Studies 08/2018 echo Study Conclusions   - Left ventricle: The cavity size was normal. Wall thickness was   increased increased in a pattern of mild to moderate LVH.   Systolic function was mildly to moderately reduced. The estimated   ejection fraction was in the range of 40% to 45%. Diffuse   hypokinesis. Doppler parameters are consistent with abnormal left   ventricular relaxation (grade 1 diastolic dysfunction). Doppler   parameters are consistent with high ventricular filling pressure. - Aortic valve: Valve area (VTI): 3.22 cm^2. Valve area (Vmax):   2.74 cm^2. Valve area (Vmean): 2.29 cm^2. - Left atrium: The atrium was  moderately dilated. - Atrial septum: No defect or patent foramen ovale was identified. - Techncically difficult study, recommend limited study with   echocontrast to better clarify LVEF. Based on available images   appears mildly decreased.   08/2018 contrast echo  Study Conclusions   - Left ventricle: Limited study with Definity contrast. Systolic   function was normal. The estimated ejection fraction was in the   range of 55% to 60%. Wall motion was normal; there were no   regional wall motion abnormalities. - Mitral valve: There was trivial regurgitation. - Pericardium, extracardiac: A prominent pericardial fat pad was   present.   07/2020 echo IMPRESSIONS     1. Left ventricular ejection fraction, by estimation, is 55 to 60%. The  left ventricle has normal function. The left ventricle has no regional  wall motion abnormalities. There is moderate left ventricular hypertrophy.  Left ventricular diastolic  parameters are indeterminate.   2. Right ventricular systolic function is normal. The right ventricular  size is normal.   3. The mitral valve is normal in structure. Mild mitral valve  regurgitation. No evidence of mitral stenosis.   4. The aortic valve is tricuspid. There is mild calcification of the  aortic valve. There is mild thickening of the aortic valve. Aortic valve  regurgitation is not visualized. No aortic stenosis is present.   5. The inferior vena cava is normal in size with greater than 50%  respiratory variability, suggesting right atrial pressure of 3 mmHg.     12/2020 echo IMPRESSIONS     1. Left ventricular ejection fraction, by estimation, is 60 to 65%. The  left ventricle has normal function. The left ventricle has no regional  wall motion abnormalities. There is mild left ventricular hypertrophy.  Left ventricular diastolic parameters  are indeterminate. Elevated left atrial pressure.   2. Right ventricular systolic function is normal. The right  ventricular  size is normal.   3. Left atrial size was severely dilated.   4. Right atrial size was mildly dilated.   5. The mitral valve is normal in structure. Mild mitral valve  regurgitation. No evidence of mitral stenosis.   6. The aortic valve is tricuspid. Aortic valve regurgitation is not  visualized. No aortic stenosis is present.   7. The inferior vena cava is dilated in size with >50% respiratory  variability, suggesting right atrial pressure of 8 mmHg.   10/2022 echo 1. Apex is akinetic. Marland Kitchen Left ventricular ejection fraction, by estimation,  is 50 to 55%. The left ventricle has low normal function. The left  ventricle demonstrates regional wall motion abnormalities (see scoring  diagram/findings for description). There  is severe left ventricular hypertrophy. Left ventricular diastolic  parameters are consistent with Grade I diastolic dysfunction (impaired  relaxation). Elevated left atrial pressure.   2. Right ventricular systolic function is normal. The right ventricular  size is normal.   3. Left atrial size was severely dilated.   4. The mitral valve is normal in structure. Mild mitral valve  regurgitation. No evidence of mitral stenosis.   5. The aortic valve is tricuspid. There is mild calcification of the  aortic valve. There is mild thickening of the aortic valve. Aortic valve  regurgitation is not visualized. No aortic stenosis is present.   6. The inferior vena cava is dilated in size with >50% respiratory  variability, suggesting right atrial pressure of 8 mmHg.   Assessment and Plan   1. CAD/ICM/HFimpER - LVEF has improved, back in normal range - severe ACEi allergy with angioedema with intubation, not a candidate for ACE or ARNI. Though low crossreactivity I would be hesitant for ARB as well  - not on antiplatelet due to multiple admits with severe symptomatic anemia and bleeding GI  AVMs.  - no symptms, continue curren tmeds   2.Hyperlipidemia -at goal,  continue current meds      [name], M.D.

## 2023-05-07 ENCOUNTER — Encounter: Payer: Self-pay | Admitting: Cardiology

## 2023-05-07 ENCOUNTER — Ambulatory Visit: Payer: PPO | Attending: Cardiology | Admitting: Cardiology

## 2023-05-07 ENCOUNTER — Encounter (HOSPITAL_COMMUNITY): Payer: Self-pay | Admitting: Hematology

## 2023-05-07 VITALS — BP 130/50 | HR 100 | Ht 72.0 in | Wt 295.0 lb

## 2023-05-07 DIAGNOSIS — I2510 Atherosclerotic heart disease of native coronary artery without angina pectoris: Secondary | ICD-10-CM | POA: Diagnosis not present

## 2023-05-07 DIAGNOSIS — J449 Chronic obstructive pulmonary disease, unspecified: Secondary | ICD-10-CM | POA: Diagnosis not present

## 2023-05-07 DIAGNOSIS — E782 Mixed hyperlipidemia: Secondary | ICD-10-CM

## 2023-05-07 DIAGNOSIS — I5032 Chronic diastolic (congestive) heart failure: Secondary | ICD-10-CM

## 2023-05-07 MED ORDER — FUROSEMIDE 40 MG PO TABS: 20.0000 mg | ORAL_TABLET | Freq: Every day | ORAL | Status: DC | PRN

## 2023-05-07 MED ORDER — ROSUVASTATIN CALCIUM 40 MG PO TABS: 40.0000 mg | ORAL_TABLET | Freq: Every day | ORAL | 3 refills | Status: DC

## 2023-05-07 MED ORDER — METOPROLOL SUCCINATE ER 50 MG PO TB24: 150.0000 mg | ORAL_TABLET | Freq: Every day | ORAL | 3 refills | Status: DC

## 2023-05-07 NOTE — Patient Instructions (Signed)
Medication Instructions:  Your physician recommends that you continue on your current medications as directed. Please refer to the Current Medication list given to you today.  **Your Lasix changed today- Please take Lasix daily as needed for fluid/edema**  Lab Work: None If you have labs (blood work) drawn today and your tests are completely normal, you will receive your results only by: MyChart Message (if you have MyChart) OR A paper copy in the mail If you have any lab test that is abnormal or we need to change your treatment, we will call you to review the results.   Testing/Procedures: None   Follow-Up: At [facility], you and your health needs are our priority.  As part of our continuing mission to provide you with exceptional heart care, we have created designated Provider Care Teams.  These Care Teams include your primary Cardiologist (physician) and Advanced Practice Providers (APPs -  Physician Assistants and Nurse Practitioners) who all work together to provide you with the care you need, when you need it.  We recommend signing up for the patient portal called "MyChart".  Sign up information is provided on this After Visit Summary.  MyChart is used to connect with patients for Virtual Visits (Telemedicine).  Patients are able to view lab/test results, encounter notes, upcoming appointments, etc.  Non-urgent messages can be sent to your provider as well.   To learn more about what you can do with MyChart, go to [email or web address].    Your next appointment:   6 month(s)  Provider:   Dina Rich, MD    Other Instructions

## 2023-06-07 DIAGNOSIS — J449 Chronic obstructive pulmonary disease, unspecified: Secondary | ICD-10-CM | POA: Diagnosis not present

## 2023-06-30 ENCOUNTER — Other Ambulatory Visit: Payer: Self-pay | Admitting: Cardiology

## 2023-07-07 DIAGNOSIS — J449 Chronic obstructive pulmonary disease, unspecified: Secondary | ICD-10-CM | POA: Diagnosis not present

## 2023-07-26 DIAGNOSIS — E1169 Type 2 diabetes mellitus with other specified complication: Secondary | ICD-10-CM | POA: Diagnosis not present

## 2023-07-26 DIAGNOSIS — E782 Mixed hyperlipidemia: Secondary | ICD-10-CM | POA: Diagnosis not present

## 2023-07-26 DIAGNOSIS — D509 Iron deficiency anemia, unspecified: Secondary | ICD-10-CM | POA: Diagnosis not present

## 2023-07-28 DIAGNOSIS — E1169 Type 2 diabetes mellitus with other specified complication: Secondary | ICD-10-CM | POA: Diagnosis not present

## 2023-07-29 DIAGNOSIS — J9611 Chronic respiratory failure with hypoxia: Secondary | ICD-10-CM | POA: Diagnosis not present

## 2023-07-29 DIAGNOSIS — I25118 Atherosclerotic heart disease of native coronary artery with other forms of angina pectoris: Secondary | ICD-10-CM | POA: Diagnosis not present

## 2023-07-29 DIAGNOSIS — E1151 Type 2 diabetes mellitus with diabetic peripheral angiopathy without gangrene: Secondary | ICD-10-CM | POA: Diagnosis not present

## 2023-07-29 DIAGNOSIS — I110 Hypertensive heart disease with heart failure: Secondary | ICD-10-CM | POA: Diagnosis not present

## 2023-07-29 DIAGNOSIS — E782 Mixed hyperlipidemia: Secondary | ICD-10-CM | POA: Diagnosis not present

## 2023-07-29 DIAGNOSIS — I2510 Atherosclerotic heart disease of native coronary artery without angina pectoris: Secondary | ICD-10-CM | POA: Diagnosis not present

## 2023-07-29 DIAGNOSIS — Z794 Long term (current) use of insulin: Secondary | ICD-10-CM | POA: Diagnosis not present

## 2023-07-29 DIAGNOSIS — T464X5A Adverse effect of angiotensin-converting-enzyme inhibitors, initial encounter: Secondary | ICD-10-CM | POA: Diagnosis not present

## 2023-07-29 DIAGNOSIS — J449 Chronic obstructive pulmonary disease, unspecified: Secondary | ICD-10-CM | POA: Diagnosis not present

## 2023-07-29 DIAGNOSIS — Z79891 Long term (current) use of opiate analgesic: Secondary | ICD-10-CM | POA: Diagnosis not present

## 2023-07-29 DIAGNOSIS — I5042 Chronic combined systolic (congestive) and diastolic (congestive) heart failure: Secondary | ICD-10-CM | POA: Diagnosis not present

## 2023-08-07 DIAGNOSIS — J449 Chronic obstructive pulmonary disease, unspecified: Secondary | ICD-10-CM | POA: Diagnosis not present

## 2023-08-23 ENCOUNTER — Other Ambulatory Visit: Payer: Self-pay | Admitting: Cardiology

## 2023-08-26 DIAGNOSIS — D6949 Other primary thrombocytopenia: Secondary | ICD-10-CM | POA: Diagnosis not present

## 2023-08-26 DIAGNOSIS — I5042 Chronic combined systolic (congestive) and diastolic (congestive) heart failure: Secondary | ICD-10-CM | POA: Diagnosis not present

## 2023-08-26 DIAGNOSIS — G629 Polyneuropathy, unspecified: Secondary | ICD-10-CM | POA: Diagnosis not present

## 2023-08-26 DIAGNOSIS — J449 Chronic obstructive pulmonary disease, unspecified: Secondary | ICD-10-CM | POA: Diagnosis not present

## 2023-08-26 DIAGNOSIS — K5900 Constipation, unspecified: Secondary | ICD-10-CM | POA: Diagnosis not present

## 2023-08-26 DIAGNOSIS — I110 Hypertensive heart disease with heart failure: Secondary | ICD-10-CM | POA: Diagnosis not present

## 2023-08-26 DIAGNOSIS — D509 Iron deficiency anemia, unspecified: Secondary | ICD-10-CM | POA: Diagnosis not present

## 2023-09-06 DIAGNOSIS — J449 Chronic obstructive pulmonary disease, unspecified: Secondary | ICD-10-CM | POA: Diagnosis not present

## 2023-09-25 ENCOUNTER — Inpatient Hospital Stay (HOSPITAL_COMMUNITY)
Admission: EM | Admit: 2023-09-25 | Discharge: 2023-09-30 | DRG: 291 | Disposition: A | Discharge status: Home / Self Care | Payer: PPO | Attending: Internal Medicine | Admitting: Internal Medicine

## 2023-09-25 ENCOUNTER — Emergency Department (HOSPITAL_COMMUNITY): Payer: PPO

## 2023-09-25 ENCOUNTER — Other Ambulatory Visit: Payer: Self-pay

## 2023-09-25 ENCOUNTER — Encounter (HOSPITAL_COMMUNITY): Payer: Self-pay

## 2023-09-25 DIAGNOSIS — I10 Essential (primary) hypertension: Secondary | ICD-10-CM | POA: Diagnosis present

## 2023-09-25 DIAGNOSIS — D649 Anemia, unspecified: Secondary | ICD-10-CM | POA: Diagnosis not present

## 2023-09-25 DIAGNOSIS — R079 Chest pain, unspecified: Secondary | ICD-10-CM

## 2023-09-25 DIAGNOSIS — K219 Gastro-esophageal reflux disease without esophagitis: Secondary | ICD-10-CM | POA: Diagnosis not present

## 2023-09-25 DIAGNOSIS — D72829 Elevated white blood cell count, unspecified: Secondary | ICD-10-CM | POA: Diagnosis present

## 2023-09-25 DIAGNOSIS — F17210 Nicotine dependence, cigarettes, uncomplicated: Secondary | ICD-10-CM | POA: Diagnosis not present

## 2023-09-25 DIAGNOSIS — D509 Iron deficiency anemia, unspecified: Secondary | ICD-10-CM | POA: Diagnosis not present

## 2023-09-25 DIAGNOSIS — Z9981 Dependence on supplemental oxygen: Secondary | ICD-10-CM

## 2023-09-25 DIAGNOSIS — Z794 Long term (current) use of insulin: Secondary | ICD-10-CM | POA: Diagnosis not present

## 2023-09-25 DIAGNOSIS — I3139 Other pericardial effusion (noninflammatory): Secondary | ICD-10-CM | POA: Diagnosis present

## 2023-09-25 DIAGNOSIS — R7989 Other specified abnormal findings of blood chemistry: Secondary | ICD-10-CM | POA: Diagnosis not present

## 2023-09-25 DIAGNOSIS — Z860100 Personal history of colon polyps, unspecified: Secondary | ICD-10-CM

## 2023-09-25 DIAGNOSIS — J9621 Acute and chronic respiratory failure with hypoxia: Secondary | ICD-10-CM | POA: Diagnosis present

## 2023-09-25 DIAGNOSIS — R609 Edema, unspecified: Secondary | ICD-10-CM | POA: Diagnosis not present

## 2023-09-25 DIAGNOSIS — I2510 Atherosclerotic heart disease of native coronary artery without angina pectoris: Secondary | ICD-10-CM | POA: Diagnosis present

## 2023-09-25 DIAGNOSIS — Q2733 Arteriovenous malformation of digestive system vessel: Secondary | ICD-10-CM | POA: Diagnosis not present

## 2023-09-25 DIAGNOSIS — I252 Old myocardial infarction: Secondary | ICD-10-CM | POA: Diagnosis not present

## 2023-09-25 DIAGNOSIS — D62 Acute posthemorrhagic anemia: Secondary | ICD-10-CM | POA: Diagnosis not present

## 2023-09-25 DIAGNOSIS — E1165 Type 2 diabetes mellitus with hyperglycemia: Secondary | ICD-10-CM | POA: Diagnosis not present

## 2023-09-25 DIAGNOSIS — K922 Gastrointestinal hemorrhage, unspecified: Secondary | ICD-10-CM | POA: Diagnosis not present

## 2023-09-25 DIAGNOSIS — E46 Unspecified protein-calorie malnutrition: Secondary | ICD-10-CM | POA: Insufficient documentation

## 2023-09-25 DIAGNOSIS — K921 Melena: Secondary | ICD-10-CM | POA: Diagnosis not present

## 2023-09-25 DIAGNOSIS — E876 Hypokalemia: Secondary | ICD-10-CM | POA: Diagnosis present

## 2023-09-25 DIAGNOSIS — E782 Mixed hyperlipidemia: Secondary | ICD-10-CM | POA: Diagnosis not present

## 2023-09-25 DIAGNOSIS — E441 Mild protein-calorie malnutrition: Secondary | ICD-10-CM | POA: Diagnosis present

## 2023-09-25 DIAGNOSIS — Q2730 Arteriovenous malformation, site unspecified: Secondary | ICD-10-CM | POA: Diagnosis not present

## 2023-09-25 DIAGNOSIS — I493 Ventricular premature depolarization: Secondary | ICD-10-CM | POA: Diagnosis present

## 2023-09-25 DIAGNOSIS — I255 Ischemic cardiomyopathy: Secondary | ICD-10-CM | POA: Diagnosis present

## 2023-09-25 DIAGNOSIS — Z6841 Body Mass Index (BMI) 40.0 and over, adult: Secondary | ICD-10-CM | POA: Diagnosis not present

## 2023-09-25 DIAGNOSIS — K31811 Angiodysplasia of stomach and duodenum with bleeding: Secondary | ICD-10-CM | POA: Diagnosis present

## 2023-09-25 DIAGNOSIS — Z888 Allergy status to other drugs, medicaments and biological substances status: Secondary | ICD-10-CM

## 2023-09-25 DIAGNOSIS — G8929 Other chronic pain: Secondary | ICD-10-CM | POA: Diagnosis not present

## 2023-09-25 DIAGNOSIS — I700 Atherosclerosis of aorta: Secondary | ICD-10-CM | POA: Diagnosis not present

## 2023-09-25 DIAGNOSIS — I110 Hypertensive heart disease with heart failure: Secondary | ICD-10-CM | POA: Diagnosis not present

## 2023-09-25 DIAGNOSIS — I509 Heart failure, unspecified: Principal | ICD-10-CM

## 2023-09-25 DIAGNOSIS — R0789 Other chest pain: Secondary | ICD-10-CM | POA: Diagnosis not present

## 2023-09-25 DIAGNOSIS — K5909 Other constipation: Secondary | ICD-10-CM | POA: Diagnosis present

## 2023-09-25 DIAGNOSIS — J449 Chronic obstructive pulmonary disease, unspecified: Secondary | ICD-10-CM | POA: Diagnosis present

## 2023-09-25 DIAGNOSIS — E8809 Other disorders of plasma-protein metabolism, not elsewhere classified: Secondary | ICD-10-CM | POA: Diagnosis not present

## 2023-09-25 DIAGNOSIS — R0902 Hypoxemia: Secondary | ICD-10-CM | POA: Diagnosis not present

## 2023-09-25 DIAGNOSIS — Z79899 Other long term (current) drug therapy: Secondary | ICD-10-CM

## 2023-09-25 DIAGNOSIS — J811 Chronic pulmonary edema: Secondary | ICD-10-CM | POA: Diagnosis not present

## 2023-09-25 DIAGNOSIS — Z743 Need for continuous supervision: Secondary | ICD-10-CM | POA: Diagnosis not present

## 2023-09-25 DIAGNOSIS — E66813 Obesity, class 3: Secondary | ICD-10-CM | POA: Diagnosis present

## 2023-09-25 DIAGNOSIS — I5031 Acute diastolic (congestive) heart failure: Secondary | ICD-10-CM | POA: Diagnosis not present

## 2023-09-25 DIAGNOSIS — M19012 Primary osteoarthritis, left shoulder: Secondary | ICD-10-CM | POA: Diagnosis present

## 2023-09-25 DIAGNOSIS — Z7984 Long term (current) use of oral hypoglycemic drugs: Secondary | ICD-10-CM

## 2023-09-25 DIAGNOSIS — Z955 Presence of coronary angioplasty implant and graft: Secondary | ICD-10-CM

## 2023-09-25 DIAGNOSIS — Z6836 Body mass index (BMI) 36.0-36.9, adult: Secondary | ICD-10-CM

## 2023-09-25 DIAGNOSIS — Z8719 Personal history of other diseases of the digestive system: Secondary | ICD-10-CM | POA: Diagnosis not present

## 2023-09-25 DIAGNOSIS — I5043 Acute on chronic combined systolic (congestive) and diastolic (congestive) heart failure: Secondary | ICD-10-CM | POA: Diagnosis present

## 2023-09-25 DIAGNOSIS — Z833 Family history of diabetes mellitus: Secondary | ICD-10-CM

## 2023-09-25 DIAGNOSIS — G894 Chronic pain syndrome: Secondary | ICD-10-CM | POA: Diagnosis not present

## 2023-09-25 DIAGNOSIS — R0602 Shortness of breath: Secondary | ICD-10-CM | POA: Diagnosis not present

## 2023-09-25 DIAGNOSIS — Z8249 Family history of ischemic heart disease and other diseases of the circulatory system: Secondary | ICD-10-CM

## 2023-09-25 DIAGNOSIS — E119 Type 2 diabetes mellitus without complications: Secondary | ICD-10-CM | POA: Diagnosis not present

## 2023-09-25 DIAGNOSIS — Z01810 Encounter for preprocedural cardiovascular examination: Secondary | ICD-10-CM | POA: Diagnosis not present

## 2023-09-25 DIAGNOSIS — Z716 Tobacco abuse counseling: Secondary | ICD-10-CM

## 2023-09-25 DIAGNOSIS — J90 Pleural effusion, not elsewhere classified: Secondary | ICD-10-CM | POA: Diagnosis not present

## 2023-09-25 DIAGNOSIS — K31819 Angiodysplasia of stomach and duodenum without bleeding: Secondary | ICD-10-CM | POA: Diagnosis not present

## 2023-09-25 DIAGNOSIS — Z91199 Patient's noncompliance with other medical treatment and regimen due to unspecified reason: Secondary | ICD-10-CM

## 2023-09-25 LAB — FERRITIN: Ferritin: 4 ng/mL — ABNORMAL LOW (ref 24–336)

## 2023-09-25 LAB — BRAIN NATRIURETIC PEPTIDE: B Natriuretic Peptide: 637 pg/mL — ABNORMAL HIGH (ref 0.0–100.0)

## 2023-09-25 LAB — CBC WITH DIFFERENTIAL/PLATELET
Abs Immature Granulocytes: 0.05 10*3/uL (ref 0.00–0.07)
Basophils Absolute: 0.1 10*3/uL (ref 0.0–0.1)
Basophils Relative: 1 %
Eosinophils Absolute: 0.1 10*3/uL (ref 0.0–0.5)
Eosinophils Relative: 1 %
HCT: 24.7 % — ABNORMAL LOW (ref 39.0–52.0)
Hemoglobin: 6.2 g/dL — CL (ref 13.0–17.0)
Immature Granulocytes: 0 %
Lymphocytes Relative: 8 %
Lymphs Abs: 0.9 10*3/uL (ref 0.7–4.0)
MCH: 20.3 pg — ABNORMAL LOW (ref 26.0–34.0)
MCHC: 25.1 g/dL — ABNORMAL LOW (ref 30.0–36.0)
MCV: 80.7 fL (ref 80.0–100.0)
Monocytes Absolute: 0.8 10*3/uL (ref 0.1–1.0)
Monocytes Relative: 7 %
Neutro Abs: 10.2 10*3/uL — ABNORMAL HIGH (ref 1.7–7.7)
Neutrophils Relative %: 83 %
Platelets: 158 10*3/uL (ref 150–400)
RBC: 3.06 MIL/uL — ABNORMAL LOW (ref 4.22–5.81)
RDW: 19.7 % — ABNORMAL HIGH (ref 11.5–15.5)
WBC: 12.2 10*3/uL — ABNORMAL HIGH (ref 4.0–10.5)
nRBC: 0.2 % (ref 0.0–0.2)

## 2023-09-25 LAB — RETICULOCYTES
Immature Retic Fract: 32 % — ABNORMAL HIGH (ref 2.3–15.9)
RBC.: 3.06 MIL/uL — ABNORMAL LOW (ref 4.22–5.81)
Retic Count, Absolute: 123.6 10*3/uL (ref 19.0–186.0)
Retic Ct Pct: 4 % — ABNORMAL HIGH (ref 0.4–3.1)

## 2023-09-25 LAB — COMPREHENSIVE METABOLIC PANEL
ALT: 12 U/L (ref 0–44)
AST: 16 U/L (ref 15–41)
Albumin: 3.2 g/dL — ABNORMAL LOW (ref 3.5–5.0)
Alkaline Phosphatase: 59 U/L (ref 38–126)
Anion gap: 4 — ABNORMAL LOW (ref 5–15)
BUN: 14 mg/dL (ref 8–23)
CO2: 31 mmol/L (ref 22–32)
Calcium: 9 mg/dL (ref 8.9–10.3)
Chloride: 98 mmol/L (ref 98–111)
Creatinine, Ser: 0.98 mg/dL (ref 0.61–1.24)
GFR, Estimated: >60 mL/min (ref >60)
Glucose, Bld: 123 mg/dL — ABNORMAL HIGH (ref 70–99)
Potassium: 3.8 mmol/L (ref 3.5–5.1)
Sodium: 133 mmol/L — ABNORMAL LOW (ref 135–145)
Total Bilirubin: 0.5 mg/dL (ref 0.0–1.2)
Total Protein: 6.7 g/dL (ref 6.5–8.1)

## 2023-09-25 LAB — VITAMIN B12: Vitamin B-12: 344 pg/mL (ref 180–914)

## 2023-09-25 LAB — POC OCCULT BLOOD, ED: Fecal Occult Blood: POSITIVE

## 2023-09-25 LAB — IRON AND TIBC
Iron: 19 ug/dL — ABNORMAL LOW (ref 45–182)
Saturation Ratios: 4 % — ABNORMAL LOW (ref 17.9–39.5)
TIBC: 540 ug/dL — ABNORMAL HIGH (ref 250–450)
UIBC: 521 ug/dL

## 2023-09-25 LAB — FOLATE: Folate: 6.8 ng/mL (ref >5.9)

## 2023-09-25 LAB — TROPONIN I (HIGH SENSITIVITY)
Troponin I (High Sensitivity): 16 ng/L (ref <18)
Troponin I (High Sensitivity): 17 ng/L (ref <18)

## 2023-09-25 LAB — PREPARE RBC (CROSSMATCH)

## 2023-09-25 MED ORDER — NITROGLYCERIN 0.4 MG SL SUBL
0.4000 mg | SUBLINGUAL_TABLET | Freq: Once | SUBLINGUAL | Status: AC
  Administered 2023-09-25: 0.4 mg via SUBLINGUAL
  Filled 2023-09-25: qty 1

## 2023-09-25 MED ORDER — NITROGLYCERIN 0.4 MG SL SUBL
0.4000 mg | SUBLINGUAL_TABLET | Freq: Once | SUBLINGUAL | Status: DC
  Filled 2023-09-25: qty 1

## 2023-09-25 MED ORDER — IPRATROPIUM-ALBUTEROL 0.5-2.5 (3) MG/3ML IN SOLN
3.0000 mL | Freq: Once | RESPIRATORY_TRACT | Status: AC
  Administered 2023-09-25: 3 mL via RESPIRATORY_TRACT
  Filled 2023-09-25: qty 3

## 2023-09-25 MED ORDER — SODIUM CHLORIDE 0.9% IV SOLUTION: Freq: Once | INTRAVENOUS | Status: AC

## 2023-09-25 MED ORDER — PANTOPRAZOLE SODIUM 40 MG IV SOLR
40.0000 mg | INTRAVENOUS | Status: AC
  Administered 2023-09-25: 40 mg via INTRAVENOUS
  Filled 2023-09-25: qty 10

## 2023-09-25 MED ORDER — FUROSEMIDE 10 MG/ML IJ SOLN
40.0000 mg | INTRAMUSCULAR | Status: AC
  Administered 2023-09-25: 40 mg via INTRAVENOUS
  Filled 2023-09-25: qty 4

## 2023-09-25 NOTE — ED Triage Notes (Signed)
 Pt to ED via [facility] from home cc chest pain ongoing for 4 days. Took 3 ntg pta without relief. Cardiac hx with stent placement 3 years ago after MI per pt. Endorses weakness. Seen by pcp prior to christmas and was told his hemoglobin was 7, needed to come to ED for transfusion but pt did not.

## 2023-09-25 NOTE — H&P (Signed)
 History and Physical    Patient: Gregory Alvarado DOB: 01/30/1951 DOA: 09/25/2023 DOS: the patient was seen and examined on 09/26/2023 PCP: Shona Norleen PEDLAR, MD  Patient coming from: Home  Chief Complaint:  Chief Complaint  Patient presents with   Chest Pain   HPI: Gregory Alvarado is a 73 y.o. male with medical history significant of chronic systolic and diastolic CHF, CAD with DES (12/21), type 2 diabetes mellitus, hyperlipidemia, GI bleed, COPD, chronic respiratory failure on supplemental oxygen  at 3 - 4 LPM of oxygen , tobacco abuse who presents to the emergency department due to several weeks onset of chest pain that has been worsening especially with exertion.  He states that he was prescribed diuretics in August 2024, but he does not have this medication at home.  Patient was told that his hemoglobin was around 7 prior to Christmas, but patient did not want to come to the emergency department around the festive period until now.  He endorsed noticing black stools, but denies alcohol use, patient also denies use of anti-inflammatory drugs.  ED Course:  In the emergency department, BP was 152/62, other vital signs were within normal range, O2 sat was 97% on supplemental oxygen  at 3 LPM.  Workup in the ED showed WBC 12.2, hemoglobin 6.2, hematocrit 24.7, MCV 80.7, platelets 158.  BMP was normal except for sodium of 133 and blood glucose of 123.  Albumin 3.2, BNP 637, FOBT was positive. Chest x-ray showed cardiomegaly with mild interstitial edema.  Small to moderate left pleural effusion IV Lasix  40 mg x 1 was given, Protonix  x 1 was given, DuoNebs was provided and IV nitroglycerin  0.4 mg x 1 was given. Hospitalist was asked to admit patient for further evaluation and management.  Review of Systems: Review of systems as noted in the HPI. All other systems reviewed and are negative.   Past Medical History:  Diagnosis Date   Anemia    Arthritis    left shoulder   COPD (chronic  obstructive pulmonary disease) (HCC)    Coronary artery disease 09/11/2020   NSTEMI and 3 stents in [facility]   Diabetes mellitus without complication (HCC)    Dyslipidemia    Hypertension    Insomnia    Insomnia    Morbid obesity (HCC)    Tobacco use    Past Surgical History:  Procedure Laterality Date   BIOPSY  10/28/2019   Procedure: BIOPSY;  Surgeon: Harvey Margo CROME, MD;  [facility];  Service: Endoscopy;;  gastric   BIOPSY  04/15/2020   Procedure: BIOPSY;  Surgeon: Shaaron Lamar HERO, MD;  [facility];  Service: Endoscopy;;  gastric   CARDIAC CATHETERIZATION  08/2020   3 stents placed   COLON RESECTION  1990s?   DIVERTICULITIS   COLONOSCOPY WITH PROPOFOL  N/A 04/15/2020   Rourk: Poor colon prep. two 6 to 8 mm polyps removed from the hepatic flexure, tubular adenomas.  Repeat colonoscopy later this year.   ENTEROSCOPY N/A 11/26/2021   Procedure: ENTEROSCOPY;  Surgeon: Eartha Angelia Sieving, MD;  [facility];  Service: Gastroenterology;  Laterality: N/A;   ENTEROSCOPY N/A 03/13/2022   Procedure: ENTEROSCOPY;  Surgeon: Cindie Carlin POUR, DO;  [facility];  Service: Endoscopy;  Laterality: N/A;   ESOPHAGOGASTRODUODENOSCOPY (EGD) WITH PROPOFOL  N/A 10/28/2019   Dr. Harvey: Multiple cratered gastric ulcers, gastritis/duodenitis.  Path showed H. pylori.  Patient treated with amoxicillin /Biaxin /PPI twice daily.   ESOPHAGOGASTRODUODENOSCOPY (EGD) WITH PROPOFOL  N/A 04/15/2020  Rourk: Patchy gastric erythema status post biopsy to document eradication of H. pylori.  Biopsy showed persistent H. pylori.  Previously noted gastric ulcer is completely healed.   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   ESOPHAGOGASTRODUODENOSCOPY (EGD) WITH PROPOFOL  N/A 03/13/2022   Procedure: ESOPHAGOGASTRODUODENOSCOPY (EGD) WITH PROPOFOL ;  Surgeon: Cindie Carlin POUR, DO;  [facility];  Service: Endoscopy;  Laterality: N/A;   HERNIA REPAIR     UHR   HOT HEMOSTASIS  11/26/2021   Procedure: HOT HEMOSTASIS (ARGON PLASMA COAGULATION/BICAP);  Surgeon: Eartha Flavors, Toribio, MD;  [facility];  Service: Gastroenterology;;   AGAPITO DILATION N/A 04/15/2020   Procedure: AGAPITO HODGKIN;  Surgeon: Shaaron Lamar HERO, MD;  [facility];  Service: Endoscopy;  Laterality: N/A;   POLYPECTOMY  04/15/2020   Procedure: POLYPECTOMY;  Surgeon: Shaaron Lamar HERO, MD;  [facility];  Service: Endoscopy;;  colon   ROTATOR CUFF REPAIR Bilateral    TYMPANOPLASTY      Social History:  reports that he has been smoking cigarettes. He has never used smokeless tobacco. He reports that he does not currently use alcohol. He reports that he does not use drugs.   Allergies  Allergen Reactions   Lisinopril  Swelling    ACEi angioedema requiring intubation 07/2022   Caduet [Amlodipine-Atorvastatin ] Swelling    Family History  Problem Relation Age of Onset   Diabetes Mother    Heart attack Mother    Heart attack Father    Heart attack Brother    Colon cancer Neg Hx    Stomach cancer Neg Hx      Prior to Admission medications   Medication Sig Start Date End Date Taking? Authorizing Provider  acetaminophen  (TYLENOL ) 500 MG tablet Take 1,000 mg by mouth every 6 (six) hours as needed for mild pain.    [provider]  albuterol  (VENTOLIN  HFA) 108 (90 Base) MCG/ACT inhaler Inhale 2 puffs into the lungs every 4 (four) hours as needed for wheezing or shortness of breath. 07/15/22   [provider]  bacitracin  ointment Apply topically 2 (two) times daily. 07/31/22   Sheikh, Alejandro Latif, DO  cyanocobalamin  (,VITAMIN B-12,) 1000 MCG/ML injection Inject 1,000 mcg into the muscle every 30 (thirty) days. 02/11/22   [provider]  diphenhydrAMINE  (BENADRYL ) 25 mg capsule Take 25 mg by mouth once.    [provider]  diphenhydramine -acetaminophen  (TYLENOL  PM) 25-500 MG TABS tablet Take 1 tablet by mouth at bedtime as needed (sleep).    [provider]  empagliflozin  (JARDIANCE ) 10 MG TABS tablet Take 1 tablet (10 mg total) by mouth daily. 08/01/22   Sherrill Alejandro Latif, DO  ferrous sulfate  325 (65 FE) MG tablet Take 1 tablet (325 mg total) by mouth 2 (two) times daily with a meal. 03/16/22   Tat, Alm, MD  furosemide  (LASIX ) 40 MG tablet Take 0.5 tablets (20 mg total) by mouth daily as needed for fluid or edema. 05/07/23   Alvan Dorn FALCON, MD  hydrocortisone  (ANUSOL -HC) 2.5 % rectal cream Place rectally 2 (two) times daily. 07/31/22   Sherrill Alejandro Latif, DO  insulin  NPH-regular Human (NOVOLIN 70/30) (70-30) 100 UNIT/ML injection Inject 40 Units into the skin in the morning and at bedtime.    [provider]  Ipratropium-Albuterol  (COMBIVENT  RESPIMAT) 20-100 MCG/ACT AERS respimat Inhale 1 puff into the lungs every 6 (six) hours as  needed for wheezing or shortness of breath. 11/27/21   Maree, Pratik D, DO  levocetirizine (XYZAL ) 5 MG tablet Take 5 mg by mouth at bedtime. 03/11/22   [provider]  metoprolol  succinate (TOPROL -XL) 50 MG 24 hr tablet TAKE 3 TABLETS BY MOUTH EVERY DAY 08/23/23   Alvan Dorn FALCON, MD  nitroGLYCERIN  (NITROSTAT ) 0.4 MG SL tablet DISSOLVE 1 TABLET SUBLINGUALLY AS NEEDED FOR CHEST PAIN, MAY REPEAT EVERY 5 MINUTES. AFTER 3 CALL 911. 06/30/23   Alvan Dorn FALCON, MD  pantoprazole  (PROTONIX ) 40 MG tablet Take 40 mg by mouth daily. 03/21/22   [provider]  Potassium 99 MG TABS Take 99 mg by mouth daily as needed (cramps). Patient not taking: Reported on 05/07/2023    [provider]  rosuvastatin  (CRESTOR ) 40 MG tablet Take 1 tablet (40 mg total) by mouth daily. 05/07/23 05/01/24  Alvan Dorn FALCON, MD  traMADol  (ULTRAM ) 50 MG tablet Take 50-100 mg by mouth in the morning and at bedtime. 09/20/19   [provider]  zolpidem   (AMBIEN ) 5 MG tablet Take 5 mg by mouth at bedtime as needed for sleep. 09/26/20   [provider]    Physical Exam: BP 123/82 (BP Location: Left Arm)   Pulse 83   Temp 98 F (36.7 C) (Oral)   Resp (!) 24   Ht 6' (1.829 m)   Wt 133.8 kg   SpO2 92%   BMI 40.01 kg/m   General: 73 y.o. year-old male sick appearing, but in no acute distress.  Alert and oriented x3. HEENT: NCAT, EOMI Neck: Supple, trachea medial Cardiovascular:  Regular rate and rhythm with no rubs or gallops.  No thyromegaly or JVD noted.  +2 lower extremity edema to the thighs bilaterally. 2/4 pulses in all 4 extremities. Respiratory: Bilateral Rales in lower lobes (L > R ). Clear to auscultation with no wheezes or rales. Abdomen: Soft, nontender nondistended with normal bowel sounds x4 quadrants. Muskuloskeletal: No cyanosis, clubbing noted bilaterally Neuro: CN II-XII intact, strength 5/5 x 4, sensation, reflexes intact Skin: No ulcerative lesions noted or rashes Psychiatry: Judgement and insight appear normal. Mood is appropriate for condition and setting          Labs on Admission:  Basic Metabolic Panel: Recent Labs  Lab 09/25/23 2136  NA 133*  K 3.8  CL 98  CO2 31  GLUCOSE 123*  BUN 14  CREATININE 0.98  CALCIUM  9.0   Liver Function Tests: Recent Labs  Lab 09/25/23 2136  AST 16  ALT 12  ALKPHOS 59  BILITOT 0.5  PROT 6.7  ALBUMIN 3.2*   No results for input(s): LIPASE, AMYLASE in the last 168 hours. No results for input(s): AMMONIA in the last 168 hours. CBC: Recent Labs  Lab 09/25/23 2136  WBC 12.2*  NEUTROABS 10.2*  HGB 6.2*  HCT 24.7*  MCV 80.7  PLT 158   Cardiac Enzymes: No results for input(s): CKTOTAL, CKMB, CKMBINDEX, TROPONINI in the last 168 hours.  BNP (last 3 results) Recent Labs    09/25/23 2136  BNP 637.0*    ProBNP (last 3 results) No results for input(s): PROBNP in the last 8760 hours.  CBG: Recent Labs  Lab 09/26/23 0012   GLUCAP 112*    Radiological Exams on Admission: DG Chest Port 1 View Result Date: 09/25/2023 CLINICAL DATA:  Shortness of breath, history of CHF EXAM: PORTABLE CHEST 1 VIEW COMPARISON:  07/31/2022 FINDINGS: Cardiomegaly with mild interstitial edema. Small to moderate left pleural effusion.  No pneumothorax. IMPRESSION: Cardiomegaly with mild interstitial edema. Small to moderate left pleural effusion. Electronically Signed   By: Pinkie Pebbles M.D.   On: 09/25/2023 21:57    EKG: I independently viewed the EKG done and my findings are as followed: Sinus rhythm at rate of 95 bpm with VPCs.  QTc 493 ms  Assessment/Plan Present on Admission:  Acute on chronic combined systolic and diastolic CHF (congestive heart failure) (HCC)  Elevated brain natriuretic peptide (BNP) level  Symptomatic anemia  GI bleed  Essential hypertension  Mixed hyperlipidemia  Type 2 diabetes mellitus with hyperglycemia (HCC)  Obesity, Class III, BMI 40-49.9 (morbid obesity) (HCC)  Principal Problem:   Acute on chronic combined systolic and diastolic CHF (congestive heart failure) (HCC) Active Problems:   GI bleed   Elevated brain natriuretic peptide (BNP) level   Symptomatic anemia   Essential hypertension   Mixed hyperlipidemia   Obesity, Class III, BMI 40-49.9 (morbid obesity) (HCC)   Type 2 diabetes mellitus with hyperglycemia (HCC)   Hypoalbuminemia due to protein-calorie malnutrition (HCC)   Leukocytosis   GERD (gastroesophageal reflux disease)   Chronic pain  Acute on chronic systolic and diastolic CHF Elevated BNP (637) Chest x-ray showed cardiomegaly with mild interstitial edema.  Small to moderate left pleural effusion Patient with bilateral lower extremities to the thighs Patient has not been compliant with Lasix  at home Continue total input/output, daily weights and fluid restriction Continue IV Lasix  40 twice daily Continue Jardiance  Continue heart healthy diet  Echocardiogram done on  10/22/2022 showed a recovery of the heart with LVEF of 50 to 55% (from 40 to 45% on 08/28/2021).  Apex is akinetic.  Positive RWMA.  Severe LVH.  G1 DD.  Echocardiogram will be done in the morning.  Symptomatic anemia Hemoglobin 6.2, 2 units of PRBC was ordered to be transfused in the ED  GI bleed H/H= 6.2/24.7, this was 9.8/34.9 on 07/31/2022 FOBT was positive Continue IV Protonix  40 twice daily Patient will be kept n.p.o. at this time Gastroenterologist  will be consulted in the morning  Hypoalbuminemia possibly secondary to mild protein calorie malnutrition Albumin 3.2, protein supplement will be provided  Leukocytosis possibly reactive WBC 12.2, no obvious sign of acute infectious process at this time Continue to monitor WBC with morning labs  Morbid obesity (BMI 40.01) Diet and lifestyle medication  Type 2 diabetes mellitus with hyperglycemia Markedly A1c on 07/26/2022 was 7.7 Continue Jardiance  Continue ISS and hypoglycemia protocol  Essential hypertension Continue IV Lasix  40 mg twice daily  Mixed hyperlipidemia Continue Crestor   GERD Continue Protonix   COPD (not in acute exacerbation) Continue Combivent   Chronic pain Continue home tramadol   DVT prophylaxis: SCDs (consider resuming chemoprophylaxis if no indication for any GI intervention)  Code Status: Full code  Family Communication: None at bedside  Consults: Gastroenterology  Severity of Illness: The appropriate patient status for this patient is INPATIENT. Inpatient status is judged to be reasonable and necessary in order to provide the required intensity of service to ensure the patient's safety. The patient's presenting symptoms, physical exam findings, and initial radiographic and laboratory data in the context of their chronic comorbidities is felt to place them at high risk for further clinical deterioration. Furthermore, it is not anticipated that the patient will be medically stable for discharge  from the hospital within 2 midnights of admission.   * I certify that at the point of admission it is my clinical judgment that the patient will require inpatient hospital care spanning beyond  2 midnights from the point of admission due to high intensity of service, high risk for further deterioration and high frequency of surveillance required.*  Author: Faisal Stradling, DO 09/26/2023 4:17 AM  For on call review [email or web address].

## 2023-09-25 NOTE — ED Provider Notes (Signed)
 [place] EMERGENCY DEPARTMENT AT [facility] Provider Note   CSN: [id number] Arrival date & time: 09/25/23  2059     History  Chief Complaint  Patient presents with   Chest Pain    Gregory Alvarado is a 73 y.o. male.   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  He reports he has a history of congestive heart failure but is not treated with any diuretics.  The medical record shows that he was prescribed diuretics in August 2024 but states he does not have any at home.  He is a diabetic on insulin , COPD on breathing treatments and oxygen , antihypertensives for his hypertension and a statin for his cholesterol.  He reports that he was told by his family doctor that he had a hemoglobin around 7 just before Christmas but because he did not want to come to the hospital over the Christmas season he chose not to come in and came today.  He has been having chest pain and shortness of breath chest pain has been getting worse over the last couple of weeks especially with exertion.  He does state that his stools are black, denies alcohol use but does take occasional anti-inflammatories    Home Medications Prior to Admission medications   Medication Sig Start Date End Date Taking? Authorizing Provider  acetaminophen  (TYLENOL ) 500 MG tablet Take 1,000 mg by mouth every 6 (six) hours as needed for mild pain.    [provider]  albuterol  (VENTOLIN  HFA) 108 (90 Base) MCG/ACT inhaler Inhale 2 puffs into the lungs every 4 (four) hours as needed for wheezing or shortness of breath. 07/15/22   [provider]  bacitracin  ointment Apply topically 2 (two) times daily. 07/31/22   Sheikh, Alejandro Latif, DO  cyanocobalamin  (,VITAMIN B-12,) 1000 MCG/ML injection Inject 1,000 mcg into the muscle every 30 (thirty) days. 02/11/22   [provider]  diphenhydrAMINE  (BENADRYL ) 25 mg capsule  Take 25 mg by mouth once.    [provider]  diphenhydramine -acetaminophen  (TYLENOL  PM) 25-500 MG TABS tablet Take 1 tablet by mouth at bedtime as needed (sleep).    [provider]  empagliflozin  (JARDIANCE ) 10 MG TABS tablet Take 1 tablet (10 mg total) by mouth daily. 08/01/22   Sherrill Alejandro Latif, DO  ferrous sulfate  325 (65 FE) MG tablet Take 1 tablet (325 mg total) by mouth 2 (two) times daily with a meal. 03/16/22   Tat, Alm, MD  furosemide  (LASIX ) 40 MG tablet Take 0.5 tablets (20 mg total) by mouth daily as needed for fluid or edema. 05/07/23   Alvan Dorn FALCON, MD  hydrocortisone  (ANUSOL -HC) 2.5 % rectal cream Place rectally 2 (two) times daily. 07/31/22   Sherrill Alejandro Latif, DO  insulin  NPH-regular Human (NOVOLIN 70/30) (70-30) 100 UNIT/ML injection Inject 40 Units into the skin in the morning and at bedtime.    [provider]  Ipratropium-Albuterol  (COMBIVENT  RESPIMAT) 20-100 MCG/ACT AERS respimat Inhale 1 puff into the lungs every 6 (six) hours as needed for wheezing or shortness of breath. 11/27/21   Maree, Pratik D, DO  levocetirizine (XYZAL ) 5 MG tablet Take 5 mg by mouth at bedtime. 03/11/22   [provider]  metoprolol  succinate (TOPROL -XL) 50 MG 24 hr tablet TAKE 3 TABLETS BY MOUTH EVERY DAY 08/23/23   Alvan Dorn FALCON, MD  nitroGLYCERIN  (NITROSTAT ) 0.4 MG SL tablet DISSOLVE 1 TABLET  SUBLINGUALLY AS NEEDED FOR CHEST PAIN, MAY REPEAT EVERY 5 MINUTES. AFTER 3 CALL 911. 06/30/23   Alvan Dorn FALCON, MD  pantoprazole  (PROTONIX ) 40 MG tablet Take 40 mg by mouth daily. 03/21/22   [provider]  Potassium 99 MG TABS Take 99 mg by mouth daily as needed (cramps). Patient not taking: Reported on 05/07/2023    [provider]  rosuvastatin  (CRESTOR ) 40 MG tablet Take 1 tablet (40 mg total) by mouth daily. 05/07/23 05/01/24  Alvan Dorn FALCON, MD  traMADol  (ULTRAM ) 50 MG tablet Take 50-100 mg by mouth in the morning and at bedtime. 09/20/19    [provider]  zolpidem  (AMBIEN ) 5 MG tablet Take 5 mg by mouth at bedtime as needed for sleep. 09/26/20   [provider]      Allergies    Lisinopril  and Caduet [amlodipine-atorvastatin ]    Review of Systems   Review of Systems  Cardiovascular:  Positive for chest pain.  All other systems reviewed and are negative.   Physical Exam Updated Vital Signs BP (!) 152/62   Pulse 92   Temp 98 F (36.7 C)   Resp 18   Ht 1.829 m (6')   Wt 133.8 kg   SpO2 98%   BMI 40.01 kg/m  Physical Exam Vitals and nursing note reviewed.  Constitutional:      General: He is not in acute distress.    Appearance: He is well-developed.  HENT:     Head: Normocephalic and atraumatic.     Mouth/Throat:     Pharynx: No oropharyngeal exudate.  Eyes:     General: No scleral icterus.       Right eye: No discharge.        Left eye: No discharge.     Conjunctiva/sclera: Conjunctivae normal.     Pupils: Pupils are equal, round, and reactive to light.  Neck:     Thyroid : No thyromegaly.     Vascular: No JVD.  Cardiovascular:     Rate and Rhythm: Normal rate and regular rhythm.     Heart sounds: Normal heart sounds. No murmur heard.    No friction rub. No gallop.  Pulmonary:     Effort: Respiratory distress present.     Breath sounds: Normal breath sounds. No wheezing or rales.     Comments: Decreased air movement in all lung fields, prolonged expiratory phase, speaks in shortened sentences Abdominal:     General: Bowel sounds are normal. There is no distension.     Palpations: Abdomen is soft. There is no mass.     Tenderness: There is no abdominal tenderness.     Comments: Anasarca present, no other abdominal tenderness  Musculoskeletal:        General: No tenderness. Normal range of motion.     Cervical back: Normal range of motion and neck supple.     Right lower leg: Edema present.     Left lower leg: Edema present.     Comments: Legs are edematous left greater than  right but bilateral pitting 2+ edema extending all the way up the legs onto the lower abdomen with anasarca  Lymphadenopathy:     Cervical: No cervical adenopathy.  Skin:    General: Skin is warm and dry.     Findings: No erythema or rash.  Neurological:     Mental Status: He is alert.     Coordination: Coordination normal.  Psychiatric:        Behavior: Behavior normal.  ED Results / Procedures / Treatments   Labs (all labs ordered are listed, but only abnormal results are displayed) Labs Reviewed  CBC WITH DIFFERENTIAL/PLATELET - Abnormal; Notable for the following components:      Result Value   WBC 12.2 (*)    RBC 3.06 (*)    Hemoglobin 6.2 (*)    HCT 24.7 (*)    MCH 20.3 (*)    MCHC 25.1 (*)    RDW 19.7 (*)    Neutro Abs 10.2 (*)    All other components within normal limits  IRON  AND TIBC - Abnormal; Notable for the following components:   Iron  19 (*)    TIBC 540 (*)    Saturation Ratios 4 (*)    All other components within normal limits  FERRITIN - Abnormal; Notable for the following components:   Ferritin 4 (*)    All other components within normal limits  COMPREHENSIVE METABOLIC PANEL - Abnormal; Notable for the following components:   Sodium 133 (*)    Glucose, Bld 123 (*)    Albumin 3.2 (*)    Anion gap 4 (*)    All other components within normal limits  BRAIN NATRIURETIC PEPTIDE - Abnormal; Notable for the following components:   B Natriuretic Peptide 637.0 (*)    All other components within normal limits  RETICULOCYTES - Abnormal; Notable for the following components:   Retic Ct Pct 4.0 (*)    RBC. 3.06 (*)    Immature Retic Fract 32.0 (*)    All other components within normal limits  VITAMIN B12  FOLATE  POC OCCULT BLOOD, ED  TYPE AND SCREEN  PREPARE RBC (CROSSMATCH)  TROPONIN I (HIGH SENSITIVITY)    EKG EKG Interpretation Date/Time:  Saturday September 25 2023 21:09:17 EST Ventricular Rate:  95 PR Interval:  55 QRS Duration:  104 QT  Interval:  392 QTC Calculation: 493 R Axis:   106  Text Interpretation: Sinus tachycardia Ventricular premature complex Short PR interval Right axis deviation Minimal ST depression, inferior leads Borderline prolonged QT interval Since last tracing rate faster Confirmed by Cleotilde Rogue ([id number]) on 09/25/2023 9:34:23 PM  Radiology DG Chest Port 1 View Result Date: 09/25/2023 CLINICAL DATA:  Shortness of breath, history of CHF EXAM: PORTABLE CHEST 1 VIEW COMPARISON:  07/31/2022 FINDINGS: Cardiomegaly with mild interstitial edema. Small to moderate left pleural effusion. No pneumothorax. IMPRESSION: Cardiomegaly with mild interstitial edema. Small to moderate left pleural effusion. Electronically Signed   By: Pinkie Pebbles M.D.   On: 09/25/2023 21:57    Procedures .Critical Care  Performed by: Cleotilde Rogue, MD Authorized by: Cleotilde Rogue, MD   Critical care provider statement:    Critical care time (minutes):  45   Critical care time was exclusive of:  Separately billable procedures and treating other patients and teaching time   Critical care was necessary to treat or prevent imminent or life-threatening deterioration of the following conditions:  Cardiac failure (severe anemia)   Critical care was time spent personally by me on the following activities:  Development of treatment plan with patient or surrogate, discussions with consultants, evaluation of patient's response to treatment, examination of patient, obtaining history from patient or surrogate, review of old charts, re-evaluation of patient's condition, pulse oximetry, ordering and review of radiographic studies, ordering and review of laboratory studies and ordering and performing treatments and interventions   I assumed direction of critical care for this patient from another provider in my specialty: no  Care discussed with: admitting provider   Comments:           Medications Ordered in ED Medications  nitroGLYCERIN   (NITROSTAT ) SL tablet 0.4 mg (0.4 mg Sublingual Not Given 09/25/23 2151)  0.9 %  sodium chloride  infusion (Manually program via Guardrails IV Fluids) (has no administration in time range)  pantoprazole  (PROTONIX ) injection 40 mg (40 mg Intravenous Given 09/25/23 2147)  nitroGLYCERIN  (NITROSTAT ) SL tablet 0.4 mg (0.4 mg Sublingual Given 09/25/23 2148)  ipratropium-albuterol  (DUONEB) 0.5-2.5 (3) MG/3ML nebulizer solution 3 mL (3 mLs Nebulization Given 09/25/23 2223)  furosemide  (LASIX ) injection 40 mg (40 mg Intravenous Given 09/25/23 2225)    ED Course/ Medical Decision Making/ A&P                                 Medical Decision Making Amount and/or Complexity of Data Reviewed Labs: ordered. Radiology: ordered.  Risk Prescription drug management. Decision regarding hospitalization.   This patient presents to the ED for concern of this pain and shortness of breath, this involves an extensive number of treatment options, and is a complaint that carries with it a high risk of complications and morbidity.  The differential diagnosis includes, see below The patient's EKG is remarkable only for what appears to be nonspecific T waves and poor R wave progression throughout the precordial leads.  He has very low voltage, there is no ST elevations or depressions.  He has been told that he had a very low hemoglobin this will need to be rechecked, it sounds like he is having a GI bleed based on the color of his stools, this will need to also be checked, look for anemia, anemia panel, troponins, BNP, he is clearly fluid overloaded and will need some diuresis as well.  He is critically ill-appearing and likely needs higher level of care in the hospital.    Co morbidities that complicate the patient evaluation  Diabetes, hypertension, high cholesterol, COPD, GI bleed   Additional history obtained:  Additional history obtained from medical record External records from outside source obtained and  reviewed including the patient has had office visits for constipation, neuropathy, diabetes, admission to the hospital about 14 months ago for respiratory failure, diabetes, admitted prior to that for syncope, has had a GI bleed as recently as March 2023 Upper endoscopy performed at that time by Dr. 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  The pertinent results include: Hemoglobin of just over 6, this is a severe anemia and trending downwards.  Platelets are normal.  Metabolic panel unremarkable, troponin normal, BNP elevated over 600 consistent with congestive heart failure   Imaging Studies ordered:  I ordered imaging studies including chest x-ray I independently visualized and interpreted imaging which showed cardiomegaly with interstitial edema and a pleural effusion on the left I agree with the radiologist interpretation   Cardiac Monitoring: / EKG:  The patient was maintained on a cardiac monitor.  I personally viewed and interpreted the cardiac monitored which showed an underlying rhythm of: normal sinus rhythm   Consultations Obtained:  I requested consultation with the bolus Dr. Manfred who will admit,  and discussed lab and imaging findings as well as pertinent plan - they recommend: Admission to the hospital  Problem List / ED Course / Critical interventions / Medication management  This patient is critically ill with severe anemia, chest pain, shortness of breath, fluid overloaded, congestive heart failure needing diuresis and blood transfusion in the ED for a blood hemoglobin level less than 7. I ordered medication including blood transfusion, Lasix  for chest pain shortness of breath and severe anemia Reevaluation of the patient after these medicines  showed that the patient critically ill I have reviewed the patients home medicines and have made adjustments as needed   Social Determinants of Health:  Critically ill   Test / Admission - Considered:  Patient will need higher level of care         Final Clinical Impression(s) / ED Diagnoses Final diagnoses:  Acute congestive heart failure, unspecified heart failure type (HCC)  Acute anemia  Chest pain at rest     Cleotilde Rogue, MD 09/25/23 2250

## 2023-09-25 NOTE — ED Notes (Signed)
 Male wick applied. Pt used urinal at bedside on arrival and became very short of breath and tachypneic. Given that I administered lasix, male wick utilized to maintain pt safety.

## 2023-09-26 ENCOUNTER — Inpatient Hospital Stay (HOSPITAL_COMMUNITY): Payer: PPO

## 2023-09-26 ENCOUNTER — Other Ambulatory Visit (HOSPITAL_COMMUNITY): Payer: Self-pay | Admitting: *Deleted

## 2023-09-26 DIAGNOSIS — K219 Gastro-esophageal reflux disease without esophagitis: Secondary | ICD-10-CM | POA: Insufficient documentation

## 2023-09-26 DIAGNOSIS — E8809 Other disorders of plasma-protein metabolism, not elsewhere classified: Secondary | ICD-10-CM | POA: Insufficient documentation

## 2023-09-26 DIAGNOSIS — D509 Iron deficiency anemia, unspecified: Secondary | ICD-10-CM

## 2023-09-26 DIAGNOSIS — I5031 Acute diastolic (congestive) heart failure: Secondary | ICD-10-CM

## 2023-09-26 DIAGNOSIS — G8929 Other chronic pain: Secondary | ICD-10-CM | POA: Insufficient documentation

## 2023-09-26 DIAGNOSIS — D72829 Elevated white blood cell count, unspecified: Secondary | ICD-10-CM | POA: Insufficient documentation

## 2023-09-26 DIAGNOSIS — I5043 Acute on chronic combined systolic (congestive) and diastolic (congestive) heart failure: Secondary | ICD-10-CM | POA: Diagnosis not present

## 2023-09-26 LAB — GLUCOSE, CAPILLARY
Glucose-Capillary: 112 mg/dL — ABNORMAL HIGH (ref 70–99)
Glucose-Capillary: 117 mg/dL — ABNORMAL HIGH (ref 70–99)
Glucose-Capillary: 119 mg/dL — ABNORMAL HIGH (ref 70–99)
Glucose-Capillary: 132 mg/dL — ABNORMAL HIGH (ref 70–99)
Glucose-Capillary: 176 mg/dL — ABNORMAL HIGH (ref 70–99)
Glucose-Capillary: 200 mg/dL — ABNORMAL HIGH (ref 70–99)

## 2023-09-26 LAB — COMPREHENSIVE METABOLIC PANEL
ALT: 10 U/L (ref 0–44)
AST: 14 U/L — ABNORMAL LOW (ref 15–41)
Albumin: 3.4 g/dL — ABNORMAL LOW (ref 3.5–5.0)
Alkaline Phosphatase: 63 U/L (ref 38–126)
Anion gap: 7 (ref 5–15)
BUN: 14 mg/dL (ref 8–23)
CO2: 33 mmol/L — ABNORMAL HIGH (ref 22–32)
Calcium: 9.1 mg/dL (ref 8.9–10.3)
Chloride: 96 mmol/L — ABNORMAL LOW (ref 98–111)
Creatinine, Ser: 1.06 mg/dL (ref 0.61–1.24)
GFR, Estimated: >60 mL/min (ref >60)
Glucose, Bld: 143 mg/dL — ABNORMAL HIGH (ref 70–99)
Potassium: 3.4 mmol/L — ABNORMAL LOW (ref 3.5–5.1)
Sodium: 136 mmol/L (ref 135–145)
Total Bilirubin: 1 mg/dL (ref 0.0–1.2)
Total Protein: 7 g/dL (ref 6.5–8.1)

## 2023-09-26 LAB — CBC
HCT: 30.1 % — ABNORMAL LOW (ref 39.0–52.0)
Hemoglobin: 8 g/dL — ABNORMAL LOW (ref 13.0–17.0)
MCH: 21.3 pg — ABNORMAL LOW (ref 26.0–34.0)
MCHC: 26.6 g/dL — ABNORMAL LOW (ref 30.0–36.0)
MCV: 80.3 fL (ref 80.0–100.0)
Platelets: 150 10*3/uL (ref 150–400)
RBC: 3.75 MIL/uL — ABNORMAL LOW (ref 4.22–5.81)
RDW: 18.6 % — ABNORMAL HIGH (ref 11.5–15.5)
WBC: 11 10*3/uL — ABNORMAL HIGH (ref 4.0–10.5)
nRBC: 0.2 % (ref 0.0–0.2)

## 2023-09-26 LAB — PHOSPHORUS: Phosphorus: 2.2 mg/dL — ABNORMAL LOW (ref 2.5–4.6)

## 2023-09-26 LAB — ECHOCARDIOGRAM COMPLETE
Area-P 1/2: 2.66 cm2
Height: 72 in
S' Lateral: 3.3 cm
Weight: 4585.57 [oz_av]

## 2023-09-26 LAB — HIV ANTIBODY (ROUTINE TESTING W REFLEX): HIV Screen 4th Generation wRfx: NONREACTIVE

## 2023-09-26 LAB — HEMOGLOBIN A1C
Hgb A1c MFr Bld: 5.8 % — ABNORMAL HIGH (ref 4.8–5.6)
Mean Plasma Glucose: 119.76 mg/dL

## 2023-09-26 LAB — MAGNESIUM: Magnesium: 2 mg/dL (ref 1.7–2.4)

## 2023-09-26 MED ORDER — ACETAMINOPHEN 325 MG PO TABS
650.0000 mg | ORAL_TABLET | Freq: Four times a day (QID) | ORAL | Status: DC | PRN
  Administered 2023-09-26 – 2023-09-29 (×4): 650 mg via ORAL
  Filled 2023-09-26 (×4): qty 2

## 2023-09-26 MED ORDER — DIPHENHYDRAMINE HCL 25 MG PO CAPS
25.0000 mg | ORAL_CAPSULE | Freq: Every evening | ORAL | Status: DC | PRN
  Administered 2023-09-29: 25 mg via ORAL
  Filled 2023-09-26: qty 1

## 2023-09-26 MED ORDER — ACETAMINOPHEN 650 MG RE SUPP: 650.0000 mg | Freq: Four times a day (QID) | RECTAL | Status: DC | PRN

## 2023-09-26 MED ORDER — TRAMADOL HCL 50 MG PO TABS
50.0000 mg | ORAL_TABLET | Freq: Two times a day (BID) | ORAL | Status: DC | PRN
Start: 2023-09-26 — End: 2023-09-30
  Administered 2023-09-26 – 2023-09-27 (×3): 100 mg via ORAL
  Administered 2023-09-28 – 2023-09-29 (×2): 50 mg via ORAL
  Administered 2023-09-30: 100 mg via ORAL
  Filled 2023-09-26 (×5): qty 2

## 2023-09-26 MED ORDER — ENOXAPARIN SODIUM 40 MG/0.4ML IJ SOSY: 40.0000 mg | PREFILLED_SYRINGE | INTRAMUSCULAR | Status: DC

## 2023-09-26 MED ORDER — IPRATROPIUM-ALBUTEROL 0.5-2.5 (3) MG/3ML IN SOLN
3.0000 mL | Freq: Four times a day (QID) | RESPIRATORY_TRACT | Status: DC | PRN
  Administered 2023-09-28: 3 mL via RESPIRATORY_TRACT

## 2023-09-26 MED ORDER — METOPROLOL SUCCINATE ER 25 MG PO TB24
25.0000 mg | ORAL_TABLET | Freq: Two times a day (BID) | ORAL | Status: DC
  Administered 2023-09-26 – 2023-09-30 (×8): 25 mg via ORAL
  Filled 2023-09-26 (×8): qty 1

## 2023-09-26 MED ORDER — PROCHLORPERAZINE EDISYLATE 10 MG/2ML IJ SOLN: 10.0000 mg | Freq: Four times a day (QID) | INTRAMUSCULAR | Status: DC | PRN

## 2023-09-26 MED ORDER — ROSUVASTATIN CALCIUM 20 MG PO TABS
40.0000 mg | ORAL_TABLET | Freq: Every day | ORAL | Status: DC
  Administered 2023-09-26 – 2023-09-30 (×5): 40 mg via ORAL
  Filled 2023-09-26 (×5): qty 2

## 2023-09-26 MED ORDER — FUROSEMIDE 10 MG/ML IJ SOLN
40.0000 mg | Freq: Two times a day (BID) | INTRAMUSCULAR | Status: DC
  Administered 2023-09-26 – 2023-09-29 (×7): 40 mg via INTRAVENOUS
  Filled 2023-09-26 (×7): qty 4

## 2023-09-26 MED ORDER — ENSURE ENLIVE PO LIQD
237.0000 mL | Freq: Two times a day (BID) | ORAL | Status: DC
  Administered 2023-09-27: 237 mL via ORAL

## 2023-09-26 MED ORDER — DIPHENHYDRAMINE-APAP (SLEEP) 25-500 MG PO TABS: 1.0000 | ORAL_TABLET | Freq: Every evening | ORAL | Status: DC | PRN

## 2023-09-26 MED ORDER — IPRATROPIUM-ALBUTEROL 20-100 MCG/ACT IN AERS: 1.0000 | INHALATION_SPRAY | Freq: Four times a day (QID) | RESPIRATORY_TRACT | Status: DC | PRN

## 2023-09-26 MED ORDER — PANTOPRAZOLE SODIUM 40 MG IV SOLR
40.0000 mg | Freq: Two times a day (BID) | INTRAVENOUS | Status: DC
  Administered 2023-09-26 – 2023-09-30 (×9): 40 mg via INTRAVENOUS
  Filled 2023-09-26 (×10): qty 10

## 2023-09-26 MED ORDER — INSULIN ASPART 100 UNIT/ML IJ SOLN
0.0000 [IU] | INTRAMUSCULAR | Status: DC
  Administered 2023-09-26: 3 [IU] via SUBCUTANEOUS
  Administered 2023-09-26: 2 [IU] via SUBCUTANEOUS
  Administered 2023-09-26 – 2023-09-27 (×2): 5 [IU] via SUBCUTANEOUS
  Administered 2023-09-27: 2 [IU] via SUBCUTANEOUS
  Administered 2023-09-27: 3 [IU] via SUBCUTANEOUS
  Administered 2023-09-27: 2 [IU] via SUBCUTANEOUS
  Administered 2023-09-27: 3 [IU] via SUBCUTANEOUS
  Administered 2023-09-27: 2 [IU] via SUBCUTANEOUS
  Administered 2023-09-28: 5 [IU] via SUBCUTANEOUS
  Administered 2023-09-28: 2 [IU] via SUBCUTANEOUS
  Administered 2023-09-28 (×2): 3 [IU] via SUBCUTANEOUS
  Administered 2023-09-28 (×2): 2 [IU] via SUBCUTANEOUS
  Administered 2023-09-29: 3 [IU] via SUBCUTANEOUS
  Administered 2023-09-29: 5 [IU] via SUBCUTANEOUS
  Administered 2023-09-29 (×2): 3 [IU] via SUBCUTANEOUS
  Administered 2023-09-29: 5 [IU] via SUBCUTANEOUS
  Administered 2023-09-30: 3 [IU] via SUBCUTANEOUS
  Administered 2023-09-30: 8 [IU] via SUBCUTANEOUS
  Administered 2023-09-30 (×2): 3 [IU] via SUBCUTANEOUS

## 2023-09-26 MED ORDER — PANTOPRAZOLE SODIUM 40 MG PO TBEC: 40.0000 mg | DELAYED_RELEASE_TABLET | Freq: Every day | ORAL | Status: DC

## 2023-09-26 MED ORDER — EMPAGLIFLOZIN 10 MG PO TABS
10.0000 mg | ORAL_TABLET | Freq: Every day | ORAL | Status: DC
  Administered 2023-09-26: 10 mg via ORAL
  Filled 2023-09-26: qty 1

## 2023-09-26 NOTE — Plan of Care (Signed)
   Problem: Education: Goal: Knowledge of General Education information will improve Description: Including pain rating scale, medication(s)/side effects and non-pharmacologic comfort measures Outcome: Not Progressing

## 2023-09-26 NOTE — Consult Note (Signed)
 Toribio Fortune, M.D. Gastroenterology & Hepatology                                           Patient Name: Gregory Alvarado Account #: @FLAACCTNO @   MRN: [id number] Admission Date: 09/25/2023 Date of Evaluation:  09/26/2023 Time of Evaluation: 9:06 AM   Referring Physician: Rendall Carwin, MD  Chief Complaint:  chronic melena, anemia  HPI:  This is a 73 y.o. male  with medical history significant of chronic systolic and diastolic CHF, CAD with DES (12/21), type 2 diabetes mellitus, hyperlipidemia, GI bleed, COPD, chronic respiratory failure on supplemental oxygen  at 3 - 4 LPM of oxygen , tobacco abuse  and recurrent AVMs, who was admitted to the hospital after presenting worsening chest pain and shortness of breath with exertion.  Patient was found to have exacerbation of heart failure.  Gastroenterology was consulted for evaluation of chronic melena episodes and worsening anemia.  The patient states that for the last few weeks he has presented worsening shortness of breath with exertion along with edema in his lower extremities.  He became concerned as he was having worsening chest pain.  Notably, he reports that for the last 1 or 2 years has had persistent melena .  The patient reported being very frustrated as you have not figured out what is going on with my bowels.  Denies taking any anticoagulants or NSAIDs.  In the ED, he was HD stable and afebrile. Labs were remarkable for  Sodium 133, BUN 14, creatinine 0.98, normal liver enzymes, iron  19, ferritin 4, iron  saturation 4%, hemoglobin 6.2, white blood cell count 12.2 and platelets 158.  BNP in the 600s. Chest x-ray showed cardiomegaly with mild interstitial edema and small to moderate left pleural effusion.  Patient was transfused 2 units of PRBC with adequate response.  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  Past Medical History: SEE CHRONIC ISSSUES: Past Medical History:  Diagnosis Date   Anemia    Arthritis    left shoulder   COPD (chronic obstructive pulmonary disease) (HCC)    Coronary artery disease 09/11/2020   NSTEMI and 3 stents in [facility]   Diabetes mellitus without complication (HCC)    Dyslipidemia    Hypertension    Insomnia    Insomnia    Morbid obesity (HCC)    Tobacco use    Past Surgical History:  Past Surgical History:  Procedure Laterality Date   BIOPSY  10/28/2019   Procedure: BIOPSY;  Surgeon: Harvey Margo CROME, MD;  [facility];  Service: Endoscopy;;  gastric   BIOPSY  04/15/2020   Procedure: BIOPSY;  Surgeon: Shaaron Lamar HERO, MD;  [facility]: [facility];  Service: Endoscopy;;  gastric   CARDIAC CATHETERIZATION  08/2020   3 stents placed   COLON RESECTION  1990s?   DIVERTICULITIS   COLONOSCOPY WITH PROPOFOL  N/A 04/15/2020   Rourk: Poor colon prep.  two 6 to 8 mm polyps removed from the hepatic flexure, tubular adenomas.  Repeat colonoscopy later this year.   ENTEROSCOPY N/A 11/26/2021   Procedure: ENTEROSCOPY;  Surgeon: Eartha Angelia Sieving, MD;  [facility]: [facility];  Service: Gastroenterology;  Laterality: N/A;   ENTEROSCOPY N/A 03/13/2022   Procedure: ENTEROSCOPY;  Surgeon: Cindie Carlin POUR, DO;  Location: [facility];  Service: Endoscopy;  Laterality: N/A;   ESOPHAGOGASTRODUODENOSCOPY (EGD) WITH PROPOFOL  N/A 10/28/2019   Dr. Harvey: Multiple cratered gastric ulcers, gastritis/duodenitis.  Path showed H. pylori.  Patient treated with amoxicillin /Biaxin /PPI  twice daily.   ESOPHAGOGASTRODUODENOSCOPY (EGD) WITH PROPOFOL  N/A 04/15/2020   Rourk: Patchy gastric erythema status post biopsy to document eradication of H. pylori.  Biopsy showed persistent H. pylori.  Previously noted gastric ulcer is completely healed.   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   ESOPHAGOGASTRODUODENOSCOPY (EGD) WITH PROPOFOL  N/A 03/13/2022   Procedure: ESOPHAGOGASTRODUODENOSCOPY (EGD) WITH PROPOFOL ;  Surgeon: Cindie Carlin POUR, DO;  [facility]: [facility];  Service: Endoscopy;  Laterality: N/A;   HERNIA REPAIR     UHR   HOT HEMOSTASIS  11/26/2021   Procedure: HOT HEMOSTASIS (ARGON PLASMA COAGULATION/BICAP);  Surgeon: Eartha Angelia, Sieving, MD;  [facility]: [facility];  Service: Gastroenterology;;   AGAPITO DILATION N/A 04/15/2020   Procedure: AGAPITO HODGKIN;  Surgeon: Shaaron Lamar HERO, MD;  [facility]: [facility];  Service: Endoscopy;  Laterality: N/A;   POLYPECTOMY  04/15/2020   Procedure: POLYPECTOMY;  Surgeon: Shaaron Lamar HERO, MD;  [facility]: [facility];  Service: Endoscopy;;  colon   ROTATOR CUFF REPAIR Bilateral    TYMPANOPLASTY     Family History:  Family History  Problem Relation Age of Onset   Diabetes Mother    Heart attack Mother    Heart attack Father    Heart attack Brother    Colon cancer Neg Hx    Stomach cancer Neg Hx    Social History:  Social History   Tobacco Use   Smoking status: Every Day    Current packs/day: 2.00    Types: Cigarettes   Smokeless tobacco: Never  Vaping Use   Vaping status: Never Used  Substance Use Topics   Alcohol use: Not Currently    Comment: very rare   Drug use: No    Home Medications:  Prior to Admission medications   Medication Sig Start Date End Date Taking? Authorizing Provider  acetaminophen  (TYLENOL ) 500 MG tablet Take 1,000 mg by mouth every 6 (six) hours as needed for mild pain.     [provider]  albuterol  (VENTOLIN  HFA) 108 (90 Base) MCG/ACT inhaler Inhale 2 puffs into the lungs every 4 (four) hours as needed for wheezing or shortness of breath. 07/15/22   [provider]  bacitracin  ointment Apply topically 2 (two) times daily. 07/31/22   Sheikh, Alejandro Latif, DO  cyanocobalamin  (,VITAMIN B-12,) 1000 MCG/ML injection Inject 1,000 mcg into the muscle every 30 (thirty) days. 02/11/22   [provider]  diphenhydrAMINE  (BENADRYL ) 25 mg capsule Take 25 mg by mouth once.    [provider]  diphenhydramine -acetaminophen  (TYLENOL  PM) 25-500 MG TABS tablet Take 1 tablet by mouth at bedtime as needed (sleep).    [provider]  empagliflozin  (JARDIANCE ) 10 MG TABS tablet Take 1 tablet (10 mg total) by mouth daily. 08/01/22   Sherrill Alejandro Latif, DO  ferrous sulfate  325 (65 FE)  MG tablet Take 1 tablet (325 mg total) by mouth 2 (two) times daily with a meal. 03/16/22   Tat, Alm, MD  furosemide  (LASIX ) 40 MG tablet Take 0.5 tablets (20 mg total) by mouth daily as needed for fluid or edema. 05/07/23   Alvan Dorn FALCON, MD  hydrocortisone  (ANUSOL -HC) 2.5 % rectal cream Place rectally 2 (two) times daily. 07/31/22   Sherrill Cable Latif, DO  insulin  NPH-regular Human (NOVOLIN 70/30) (70-30) 100 UNIT/ML injection Inject 40 Units into the skin in the morning and at bedtime.    [provider]  Ipratropium-Albuterol  (COMBIVENT  RESPIMAT) 20-100 MCG/ACT AERS respimat Inhale 1 puff into the lungs every 6 (six) hours as needed for wheezing or shortness of breath. 11/27/21   Maree, Pratik D, DO  levocetirizine (XYZAL ) 5 MG tablet Take 5 mg by mouth at bedtime. 03/11/22   [provider]  metoprolol  succinate (TOPROL -XL) 50 MG 24 hr tablet TAKE 3 TABLETS BY MOUTH EVERY DAY 08/23/23   Alvan Dorn FALCON, MD  nitroGLYCERIN  (NITROSTAT ) 0.4 MG SL tablet DISSOLVE 1 TABLET SUBLINGUALLY AS NEEDED FOR CHEST PAIN, MAY REPEAT EVERY 5 MINUTES. AFTER 3  CALL 911. 06/30/23   Alvan Dorn FALCON, MD  pantoprazole  (PROTONIX ) 40 MG tablet Take 40 mg by mouth daily. 03/21/22   [provider]  Potassium 99 MG TABS Take 99 mg by mouth daily as needed (cramps). Patient not taking: Reported on 05/07/2023    [provider]  rosuvastatin  (CRESTOR ) 40 MG tablet Take 1 tablet (40 mg total) by mouth daily. 05/07/23 05/01/24  Alvan Dorn FALCON, MD  traMADol  (ULTRAM ) 50 MG tablet Take 50-100 mg by mouth in the morning and at bedtime. 09/20/19   [provider]  zolpidem  (AMBIEN ) 5 MG tablet Take 5 mg by mouth at bedtime as needed for sleep. 09/26/20   [provider]    Inpatient Medications:  Current Facility-Administered Medications:    acetaminophen  (TYLENOL ) tablet 650 mg, 650 mg, Oral, Q6H PRN, 650 mg at 09/26/23 0410 **OR** acetaminophen  (TYLENOL ) suppository 650 mg, 650 mg, Rectal, Q6H PRN, Adefeso, Oladapo, DO   diphenhydrAMINE  (BENADRYL ) capsule 25 mg, 25 mg, Oral, QHS PRN, Adefeso, Oladapo, DO   empagliflozin  (JARDIANCE ) tablet 10 mg, 10 mg, Oral, Daily, Adefeso, Oladapo, DO   feeding supplement (ENSURE ENLIVE / ENSURE PLUS) liquid 237 mL, 237 mL, Oral, BID BM, Adefeso, Oladapo, DO   furosemide  (LASIX ) injection 40 mg, 40 mg, Intravenous, Q12H, Adefeso, Oladapo, DO   insulin  aspart (novoLOG ) injection 0-15 Units, 0-15 Units, Subcutaneous, Q4H, Adefeso, Oladapo, DO, 5 Units at 09/26/23 0502   ipratropium-albuterol  (DUONEB) 0.5-2.5 (3) MG/3ML nebulizer solution 3 mL, 3 mL, Nebulization, Q6H PRN, Adefeso, Oladapo, DO   nitroGLYCERIN  (NITROSTAT ) SL tablet 0.4 mg, 0.4 mg, Sublingual, Once, Adefeso, Oladapo, DO   pantoprazole  (PROTONIX ) injection 40 mg, 40 mg, Intravenous, Q12H, Adefeso, Oladapo, DO   prochlorperazine  (COMPAZINE ) injection 10 mg, 10 mg, Intravenous, Q6H PRN, Adefeso, Oladapo, DO   rosuvastatin  (CRESTOR ) tablet 40 mg, 40 mg, Oral, Daily, Adefeso, Oladapo, DO   traMADol  (ULTRAM ) tablet 50-100 mg, 50-100 mg,  Oral, Q12H PRN, Adefeso, Oladapo, DO Allergies: Lisinopril  and Caduet [amlodipine-atorvastatin ]  Complete Review of Systems: GENERAL: negative for malaise, night sweats HEENT: No changes in hearing or vision, no nose bleeds or other nasal problems. NECK: Negative for lumps, goiter, pain and significant neck swelling RESPIRATORY: Negative for cough, wheezing CARDIOVASCULAR: Negative for chest pain, leg swelling, palpitations, orthopnea GI: SEE HPI MUSCULOSKELETAL: Negative for joint pain or swelling,  back pain, and muscle pain. SKIN: Negative for lesions, rash PSYCH: Negative for sleep disturbance, mood disorder and recent psychosocial stressors. HEMATOLOGY Negative for prolonged bleeding, bruising easily, and swollen nodes. ENDOCRINE: Negative for cold or heat intolerance, polyuria, polydipsia and goiter. NEURO: negative for tremor, gait imbalance, syncope and seizures. The remainder of the review of systems is noncontributory.  Physical Exam: BP (!) 157/74   Pulse 94   Temp 98.1 F (36.7 C) (Oral)   Resp (!) 24   Ht 6' (1.829 m)   Wt 130 kg   SpO2 93%   BMI 38.87 kg/m  GENERAL: The patient is AO x3, in mild distress.  Using nasal cannula HEENT: Head is normocephalic and atraumatic. EOMI are intact. Mouth is well hydrated and without lesions. NECK: Supple. No masses LUNGS: Has presence of rales in both lung fields. HEART: RRR, normal s1 and s2. ABDOMEN: Soft, nontender, no guarding, no peritoneal signs, and nondistended. BS +. No masses. EXTREMITIES: Without any cyanosis, clubbing, rash, lesions pulses +2.  Edema in both lower extremities up to mid shins NEUROLOGIC: AOx3, no focal motor deficit. SKIN: no jaundice, no rashes  Laboratory Data CBC:     Component Value Date/Time   WBC 11.0 (H) 09/26/2023 0622   RBC 3.75 (L) 09/26/2023 0622   HGB 8.0 (L) 09/26/2023 0622   HGB 13.7 06/09/2017 1141   HCT 30.1 (L) 09/26/2023 0622   PLT 150 09/26/2023 0622   MCV 80.3  09/26/2023 0622   MCH 21.3 (L) 09/26/2023 0622   MCHC 26.6 (L) 09/26/2023 0622   RDW 18.6 (H) 09/26/2023 0622   LYMPHSABS 0.9 09/25/2023 2136   MONOABS 0.8 09/25/2023 2136   EOSABS 0.1 09/25/2023 2136   BASOSABS 0.1 09/25/2023 2136   COAG:  Lab Results  Component Value Date   INR 1.1 11/24/2021   INR 1.0 08/28/2021   INR 1.1 10/26/2019    BMP:     Latest Ref Rng & Units 09/26/2023    6:22 AM 09/25/2023    9:36 PM 07/31/2022   12:52 AM  BMP  Glucose 70 - 99 mg/dL 856  876  879   BUN 8 - 23 mg/dL 14  14  31    Creatinine 0.61 - 1.24 mg/dL 8.93  9.01  8.82   Sodium 135 - 145 mmol/L 136  133  138   Potassium 3.5 - 5.1 mmol/L 3.4  3.8  4.5   Chloride 98 - 111 mmol/L 96  98  99   CO2 22 - 32 mmol/L 33  31  28   Calcium  8.9 - 10.3 mg/dL 9.1  9.0  9.2     HEPATIC:     Latest Ref Rng & Units 09/26/2023    6:22 AM 09/25/2023    9:36 PM 07/31/2022   12:52 AM  Hepatic Function  Total Protein 6.5 - 8.1 g/dL 7.0  6.7  6.7   Albumin 3.5 - 5.0 g/dL 3.4  3.2  3.1   AST 15 - 41 U/L 14  16  16    ALT 0 - 44 U/L 10  12  18    Alk Phosphatase 38 - 126 U/L 63  59  68   Total Bilirubin 0.0 - 1.2 mg/dL 1.0  0.5  0.6     CARDIAC:  Lab Results  Component Value Date   TROPONINI <0.30 11/21/2012     Imaging: I personally reviewed and interpreted the available imaging.  Assessment & Plan: Gregory Alvarado is a 73 y.o.  male  with medical history significant of chronic systolic and diastolic CHF, CAD with DES (12/21), type 2 diabetes mellitus, hyperlipidemia, GI bleed, COPD, chronic respiratory failure on supplemental oxygen  at 3 - 4 LPM of oxygen , tobacco abuse  and recurrent AVMs, who was admitted to the hospital with diagnosis of acute on chronic systolic/diastolic heart failure.  Gastroenterology was consulted for evaluation of chronic melena episodes and worsening anemia.  Patient had presence of drop in his hemoglobin.  He reports chronic episodes of melena for the last few years.  I suspect  that he is presenting chronic losses from more AVMs in his gastrointestinal tract.  Will need to have an evaluation of his upper gastrointestinal tract and small bowel enteroscopy.  However, the timing of this will depend on further compensation of his heart failure.  He needs to be optimized from the cardiovascular perspective prior to proceeding with this.  Will recommend cardiology evaluation and further diuresis before proceeding with this.  For now, we will recommend continuing PPI twice daily and trending his H&H.  - Repeat CBC qday, transfuse if Hb <8 - Pantoprazole  40 mg q12h IVP - 2 large bore IV lines - Active T/S - liquid diet for now - Avoid NSAIDs - Will proceed with push enteroscopy once HF/third spacing has been optimized and cleared by cardiology.  Toribio Fortune, MD Gastroenterology and Hepatology [facility] Gastroenterology

## 2023-09-26 NOTE — Progress Notes (Signed)
 Pt is restless and moves from laying in the bed to sitting at the edge of the bed due to his labored breathing. Pt. Has refused to have his bed alarm set on.

## 2023-09-26 NOTE — Progress Notes (Signed)
*  PRELIMINARY RESULTS* Echocardiogram 2D Echocardiogram has been performed.  Stacey Drain 09/26/2023, 11:09 AM

## 2023-09-26 NOTE — Progress Notes (Signed)
 PROGRESS NOTE  Gregory Alvarado, is a 73 y.o. male, DOB - Jun 09, 1951, FMW:[id number]  Admit date - 09/25/2023   Admitting Physician Posey Maier, DO  Outpatient Primary MD for the patient is Shona Norleen PEDLAR, MD  LOS - 1  Chief Complaint  Patient presents with   Chest Pain      Brief Narrative:  -73 y.o. male  with medical history significant of chronic systolic and diastolic CHF, CAD with DES (12/21), type 2 diabetes mellitus, hyperlipidemia, GI bleed, COPD, chronic respiratory failure on supplemental oxygen  at 3 - 4 LPM of oxygen , tobacco abuse  and recurrent AVMs admitted on 09/25/2023 with acute on chronic combined systolic and diastolic dysfunction CHF and concerns about possible ongoing GI bleed with acute on chronic symptomatic anemia    -Assessment and Plan: 1) acute on chronic combined diastolic and systolic dysfunction CHF/ICM Echo from 10/22/2022 showed a recovery of the heart with LVEF of 50 to 55% (from 40 to 45% on 08/28/2021). Apex is akinetic. Positive RWMA. Severe LVH. G1 DD.  -Repeat echo from 09/26/2023 with EF of 50 to 55% with wall motion abnormalities (The apical septal segment, apical inferior segment, and apex are akinetic. The entire anterior wall, entire lateral wall, anterior septum, inferior wall, mid inferoseptal segment, and basal inferoseptal segment are hypokinetic.) -- Continue IV Lasix  -Continue Jardiance  -Daily weight, I's and O's and Reds vest   cardiology consult requested  2) acute on chronic symptomatic anemia-- -hemoglobin is up to 8.0 from 6.2 after transfusion of 2 units of PRBC on 09/25/2023 -Baseline hemoglobin usually between 8 and 9 --Continue IV Protonix  -EGD from 2020 2020 with AVMs in the stomach and duodenum as well as jejunum treated with APC at that time -GI consult appreciated --GI team requesting cardiology preop clearance prior to endoscopy--  3) CAD/ICM ---s/p  DES to prox/mid LAD, DES to prox LCX, DES to OM2. In December 2021 -Avoid  aspirin  due to GI bleed concerns -Metoprolol  and Crestor  as ordered  4)COPD--no significant exhibition, continue with bronchodilators  5) acute on chronic hypoxic respiratory failure--multifactorial due to CHF and COPD -Currently requiring 2 L of oxygen  via nasal cannula -At baseline uses 2 L  6)Tobacco Abuse--- smokes 2 packs/day Smoking cessation counseling for 4 minutes today,  nicotine  patch I have discussed tobacco cessation with the patient.  I have counseled the patient regarding the negative impacts of continued tobacco use including but not limited to lung cancer, COPD, and cardiovascular disease.  I have discussed alternatives to tobacco and modalities that may help facilitate tobacco cessation including but not limited to biofeedback, hypnosis, and medications.  Total time spent with tobacco counseling was 4 minutes.  7)Class 2 Obesity- -Low calorie diet, portion control and increase physical activity discussed with patient -Body mass index is 38.87 kg/m.  8) chronic leukocytosis----review of record reveals mild chronic leukocytosis ??  Etiology -- Consider outpatient hematology follow-up postdischarge  9)DM2--- A1C 5.8 --reflecting excellent diabetic control PTA Hold Jardiance  Use Novolog /Humalog Sliding scale insulin  with Accu-Cheks/Fingersticks as ordered  Status is: Inpatient   Disposition: The patient is from: Home              Anticipated d/c is to: Home              Anticipated d/c date is: 2 days              Patient currently is not medically stable to d/c. Barriers: Not Clinically Stable-   Code Status :  -  Code Status: Full Code   Family Communication:    NA (patient is alert, awake and coherent)   DVT Prophylaxis  :   - SCDs  SCDs Start: 09/26/23 0308   Lab Results  Component Value Date   PLT 150 09/26/2023    Inpatient Medications  Scheduled Meds:  empagliflozin   10 mg Oral Daily   feeding supplement  237 mL Oral BID BM   furosemide   40 mg  Intravenous Q12H   insulin  aspart  0-15 Units Subcutaneous Q4H   nitroGLYCERIN   0.4 mg Sublingual Once   pantoprazole  (PROTONIX ) IV  40 mg Intravenous Q12H   rosuvastatin   40 mg Oral Daily   Continuous Infusions: PRN Meds:.acetaminophen  **OR** acetaminophen , diphenhydrAMINE , ipratropium-albuterol , prochlorperazine , traMADol    Anti-infectives (From admission, onward)    None       Subjective: Gregory Alvarado today has no fevers, no emesis,  No chest pain,   - Dyspnea persist -Voiding okay   Objective: Vitals:   09/26/23 0308 09/26/23 0530 09/26/23 0549 09/26/23 1353  BP:  (!) 157/74  (!) 134/57  Pulse:  94  (!) 104  Resp:  (!) 24  (!) 24  Temp:  98.1 F (36.7 C)  99.3 F (37.4 C)  TempSrc:  Oral  Oral  SpO2: 92% 93%  99%  Weight:   130 kg   Height:        Intake/Output Summary (Last 24 hours) at 09/26/2023 1440 Last data filed at 09/26/2023 1330 Gross per 24 hour  Intake 1784.67 ml  Output 5850 ml  Net -4065.33 ml   Filed Weights   09/25/23 2115 09/26/23 0549  Weight: 133.8 kg 130 kg    Physical Exam Gen:- Awake Alert,  in no apparent distress  HEENT:- [place].AT, No sclera icterus Nose- [place] 3L/min Neck-Supple Neck,No JVD,.  Lungs-  CTAB , fair symmetrical air movement CV- S1, S2 normal, regular  Abd-  +ve B.Sounds, Abd Soft, No tenderness,    Extremity/Skin:- +ve   edema, pedal pulses present  Psych-affect is appropriate, oriented x3 Neuro-generalized weakness, no new focal deficits, no tremors  Data Reviewed: I have personally reviewed following labs and imaging studies  CBC: Recent Labs  Lab 09/25/23 2136 09/26/23 0622  WBC 12.2* 11.0*  NEUTROABS 10.2*  --   HGB 6.2* 8.0*  HCT 24.7* 30.1*  MCV 80.7 80.3  PLT 158 150   Basic Metabolic Panel: Recent Labs  Lab 09/25/23 2136 09/26/23 0622  NA 133* 136  K 3.8 3.4*  CL 98 96*  CO2 31 33*  GLUCOSE 123* 143*  BUN 14 14  CREATININE 0.98 1.06  CALCIUM  9.0 9.1  MG  --  2.0  PHOS  --  2.2*    GFR: Estimated Creatinine Clearance: 90.4 mL/min (by C-G formula based on SCr of 1.06 mg/dL). Liver Function Tests: Recent Labs  Lab 09/25/23 2136 09/26/23 0622  AST 16 14*  ALT 12 10  ALKPHOS 59 63  BILITOT 0.5 1.0  PROT 6.7 7.0  ALBUMIN 3.2* 3.4*   HbA1C: Recent Labs    09/26/23 0622  HGBA1C 5.8*   Radiology Studies: ECHOCARDIOGRAM COMPLETE Result Date: 09/26/2023    ECHOCARDIOGRAM REPORT   [name] Date of Exam: 09/26/2023 Medical Rec #:  [id number]     Height:       72.0 in Accession #:    [id number]    Weight:       286.6 lb Date of Birth:  [birth year]  BSA:          2.483 m Patient Age:    72 years      BP:           157/74 mmHg Patient Gender: M             HR:           94 bpm. Exam Location:  [facility] Procedure: 2D Echo, Cardiac Doppler and Color Doppler Indications:    CHF-Acute Diastolic I50.31  History:        Patient has prior history of Echocardiogram examinations, most                 recent 10/22/2022. CHF, CAD, COPD, Signs/Symptoms:Syncope; Risk                 Factors:Hypertension, Diabetes and Dyslipidemia. Tobacco use                 (From Hx), Acute on chronic combined systolic and diastolic CHF                 (congestive heart failure) (HCC).  Sonographer:    Aida Pizza RCS Referring Phys: [id number] OLADAPO ADEFESO  Sonographer Comments: Technically difficult study due to poor echo windows. Patient has no I.V. at this time. IMPRESSIONS  1. Left ventricular ejection fraction, by estimation, is 50 to 55%. The left ventricle has low normal function. The left ventricle demonstrates regional wall motion abnormalities (see scoring diagram/findings for description). There is mild concentric left ventricular hypertrophy. Left ventricular diastolic parameters are indeterminate. Elevated left ventricular end-diastolic pressure.  2. Right ventricular systolic function is normal. The right ventricular size is normal. Tricuspid regurgitation signal is  inadequate for assessing PA pressure.  3. Left atrial size was severely dilated.  4. Right atrial size was mildly dilated.  5. Moderate pericardial effusion. The pericardial effusion is lateral to the left ventricle. There is no evidence of cardiac tamponade.  6. The mitral valve is normal in structure. No evidence of mitral valve regurgitation. No evidence of mitral stenosis.  7. The aortic valve is normal in structure. Aortic valve regurgitation is not visualized. No aortic stenosis is present.  8. The inferior vena cava is dilated in size with >50% respiratory variability, suggesting right atrial pressure of 8 mmHg. FINDINGS  Left Ventricle: Left ventricular ejection fraction, by estimation, is 50 to 55%. The left ventricle has low normal function. The left ventricle demonstrates regional wall motion abnormalities. The left ventricular internal cavity size was normal in size. There is mild concentric left ventricular hypertrophy. Left ventricular diastolic parameters are indeterminate. Elevated left ventricular end-diastolic pressure.  LV Wall Scoring: The apical septal segment, apical inferior segment, and apex are akinetic. The entire anterior wall, entire lateral wall, anterior septum, inferior wall, mid inferoseptal segment, and basal inferoseptal segment are hypokinetic. Right Ventricle: The right ventricular size is normal. No increase in right ventricular wall thickness. Right ventricular systolic function is normal. Tricuspid regurgitation signal is inadequate for assessing PA pressure. Left Atrium: Left atrial size was severely dilated. Right Atrium: Right atrial size was mildly dilated. Pericardium: A moderately sized pericardial effusion is present. The pericardial effusion is lateral to the left ventricle. There is no evidence of cardiac tamponade. Presence of epicardial fat layer. Mitral Valve: The mitral valve is normal in structure. No evidence of mitral valve regurgitation. No evidence of mitral  valve stenosis. Tricuspid Valve: The tricuspid valve is normal in structure. Tricuspid valve regurgitation is not  demonstrated. No evidence of tricuspid stenosis. Aortic Valve: The aortic valve is normal in structure. Aortic valve regurgitation is not visualized. No aortic stenosis is present. Pulmonic Valve: The pulmonic valve was normal in structure. Pulmonic valve regurgitation is not visualized. No evidence of pulmonic stenosis. Aorta: The aortic root is normal in size and structure. Venous: The inferior vena cava is dilated in size with greater than 50% respiratory variability, suggesting right atrial pressure of 8 mmHg. IAS/Shunts: No atrial level shunt detected by color flow Doppler.  LEFT VENTRICLE PLAX 2D LVIDd:         4.80 cm   Diastology LVIDs:         3.30 cm   LV e' medial:    6.31 cm/s LV PW:         1.30 cm   LV E/e' medial:  19.8 LV IVS:        1.20 cm   LV e' lateral:   6.85 cm/s LVOT diam:     2.00 cm   LV E/e' lateral: 18.2 LV SV:         71 LV SV Index:   28 LVOT Area:     3.14 cm  RIGHT VENTRICLE RV S prime:     12.30 cm/s TAPSE (M-mode): 2.5 cm LEFT ATRIUM              Index        RIGHT ATRIUM           Index LA diam:        5.20 cm  2.09 cm/m   RA Area:     23.50 cm LA Vol (A2C):   127.0 ml 51.16 ml/m  RA Volume:   69.00 ml  27.79 ml/m LA Vol (A4C):   116.0 ml 46.73 ml/m LA Biplane Vol: 129.0 ml 51.96 ml/m  AORTIC VALVE LVOT Vmax:   112.00 cm/s LVOT Vmean:  70.700 cm/s LVOT VTI:    0.225 m  AORTA Ao Root diam: 3.80 cm MITRAL VALVE MV Area (PHT): 2.66 cm     SHUNTS MV Decel Time: 285 msec     Systemic VTI:  0.22 m MV E velocity: 125.00 cm/s  Systemic Diam: 2.00 cm MV A velocity: 141.00 cm/s MV E/A ratio:  0.89 Kardie Tobb DO Electronically signed by Dub Huntsman DO Signature Date/Time: 09/26/2023/2:27:45 PM    Final    DG Chest Port 1 View Result Date: 09/25/2023 CLINICAL DATA:  Shortness of breath, history of CHF EXAM: PORTABLE CHEST 1 VIEW COMPARISON:  07/31/2022 FINDINGS:  Cardiomegaly with mild interstitial edema. Small to moderate left pleural effusion. No pneumothorax. IMPRESSION: Cardiomegaly with mild interstitial edema. Small to moderate left pleural effusion. Electronically Signed   By: Pinkie Pebbles M.D.   On: 09/25/2023 21:57   Scheduled Meds:  empagliflozin   10 mg Oral Daily   feeding supplement  237 mL Oral BID BM   furosemide   40 mg Intravenous Q12H   insulin  aspart  0-15 Units Subcutaneous Q4H   nitroGLYCERIN   0.4 mg Sublingual Once   pantoprazole  (PROTONIX ) IV  40 mg Intravenous Q12H   rosuvastatin   40 mg Oral Daily   Continuous Infusions:   LOS: 1 day   Rendall Carwin M.D on 09/26/2023 at 2:40 PM  Go to [email or web address] - for contact info  [facility] - Office  [phone]  If 7PM-7AM, please contact night-coverage [email or web address] 09/26/2023, 2:40 PM

## 2023-09-26 NOTE — Progress Notes (Signed)
   09/26/23 0013  Assess: MEWS Score  Temp 98 F (36.7 C)  BP 136/60  MAP (mmHg) 80  Pulse Rate 87  Resp (!) 26  Level of Consciousness Alert  SpO2 97 %  O2 Device Nasal Cannula  O2 Flow Rate (L/min) 3 L/min  Assess: MEWS Score  MEWS Temp 0  MEWS Systolic 0  MEWS Pulse 0  MEWS RR 2  MEWS LOC 0  MEWS Score 2  MEWS Score Color Yellow  Assess: if the MEWS score is Yellow or Red  Were vital signs accurate and taken at a resting state? Yes  Does the patient meet 2 or more of the SIRS criteria? No  MEWS guidelines implemented  Yes, yellow  Treat  MEWS Interventions Considered administering scheduled or prn medications/treatments as ordered  Take Vital Signs  Increase Vital Sign Frequency  Yellow: Q2hr x1, continue Q4hrs until patient remains green for 12hrs  Escalate  MEWS: Escalate Yellow: Discuss with charge nurse and consider notifying provider and/or RRT  Notify: Charge Nurse/RN  Name of Charge Nurse/RN Notified [facility]  Provider Notification  Provider Name/Title Dr. Manfred  Date Provider Notified 09/26/23  Time Provider Notified 0015  Method of Notification Page  Notification Reason Other (Comment) (yellow mews)  Provider response Other (Comment) (awaiting orders)  Assess: SIRS CRITERIA  SIRS Temperature  0  SIRS Respirations  1  SIRS Pulse 0  SIRS WBC 1  SIRS Score Sum  2

## 2023-09-26 NOTE — Progress Notes (Signed)
   09/26/23 1200  TOC Brief Assessment  Insurance and Status Reviewed  Patient has primary care physician Yes  Home environment has been reviewed From home  Prior level of function: Independent  Prior/Current Home Services No current home services  Social Drivers of Health Review SDOH reviewed no interventions necessary  Readmission risk has been reviewed Yes  Transition of care needs no transition of care needs at this time

## 2023-09-27 ENCOUNTER — Other Ambulatory Visit (HOSPITAL_COMMUNITY): Payer: PPO

## 2023-09-27 DIAGNOSIS — Z01810 Encounter for preprocedural cardiovascular examination: Secondary | ICD-10-CM

## 2023-09-27 DIAGNOSIS — I3139 Other pericardial effusion (noninflammatory): Secondary | ICD-10-CM

## 2023-09-27 DIAGNOSIS — I5043 Acute on chronic combined systolic (congestive) and diastolic (congestive) heart failure: Secondary | ICD-10-CM | POA: Diagnosis not present

## 2023-09-27 LAB — TYPE AND SCREEN
ABO/RH(D): A POS
Antibody Screen: NEGATIVE
Unit division: 0
Unit division: 0
Unit division: 0

## 2023-09-27 LAB — GLUCOSE, CAPILLARY
Glucose-Capillary: 141 mg/dL — ABNORMAL HIGH (ref 70–99)
Glucose-Capillary: 150 mg/dL — ABNORMAL HIGH (ref 70–99)
Glucose-Capillary: 150 mg/dL — ABNORMAL HIGH (ref 70–99)
Glucose-Capillary: 177 mg/dL — ABNORMAL HIGH (ref 70–99)
Glucose-Capillary: 184 mg/dL — ABNORMAL HIGH (ref 70–99)
Glucose-Capillary: 205 mg/dL — ABNORMAL HIGH (ref 70–99)

## 2023-09-27 LAB — BPAM RBC
Blood Product Expiration Date: 202501292359
Blood Product Expiration Date: 202501292359
Blood Product Expiration Date: 202501292359
ISSUE DATE / TIME: 202501120231
ISSUE DATE / TIME: 202501112307
ISSUE DATE / TIME: 202501120205
Unit Type and Rh: 6200
Unit Type and Rh: 202501292359
Unit Type and Rh: 6200
Unit Type and Rh: 6200

## 2023-09-27 LAB — CBC
HCT: 29.4 % — ABNORMAL LOW (ref 39.0–52.0)
Hemoglobin: 7.9 g/dL — ABNORMAL LOW (ref 13.0–17.0)
MCH: 21.4 pg — ABNORMAL LOW (ref 26.0–34.0)
MCHC: 26.9 g/dL — ABNORMAL LOW (ref 30.0–36.0)
MCV: 79.7 fL — ABNORMAL LOW (ref 80.0–100.0)
Platelets: 150 10*3/uL (ref 150–400)
RBC: 3.69 MIL/uL — ABNORMAL LOW (ref 4.22–5.81)
RDW: 19.1 % — ABNORMAL HIGH (ref 11.5–15.5)
WBC: 10.6 10*3/uL — ABNORMAL HIGH (ref 4.0–10.5)
nRBC: 0.2 % (ref 0.0–0.2)

## 2023-09-27 LAB — BASIC METABOLIC PANEL
Anion gap: 9 (ref 5–15)
BUN: 12 mg/dL (ref 8–23)
CO2: 34 mmol/L — ABNORMAL HIGH (ref 22–32)
Calcium: 9.1 mg/dL (ref 8.9–10.3)
Chloride: 93 mmol/L — ABNORMAL LOW (ref 98–111)
Creatinine, Ser: 1.15 mg/dL (ref 0.61–1.24)
GFR, Estimated: >60 mL/min (ref >60)
Glucose, Bld: 122 mg/dL — ABNORMAL HIGH (ref 70–99)
Potassium: 2.9 mmol/L — ABNORMAL LOW (ref 3.5–5.1)
Sodium: 136 mmol/L (ref 135–145)

## 2023-09-27 MED ORDER — POTASSIUM CHLORIDE CRYS ER 20 MEQ PO TBCR
40.0000 meq | EXTENDED_RELEASE_TABLET | ORAL | Status: AC
  Administered 2023-09-27 (×2): 40 meq via ORAL
  Filled 2023-09-27 (×2): qty 2

## 2023-09-27 NOTE — Consult Note (Addendum)
 Cardiology Consultation   Patient ID: Gregory Alvarado MRN: [id number]; DOB: [birth year]  Admit date: 09/25/2023 Date of Consult: 09/27/2023  PCP:  Shona Norleen PEDLAR, MD   [facility] Providers Cardiologist:  Alvan Carrier, MD   {  Patient Profile:   Gregory Alvarado is a 73 y.o. male with a hx of CAD with prior stenting x 3 2021, ICM, HFrEF, COPD on 2L O2 at home, lower leg edema, chronic anemia with  prior transfusions, h/o angioedema with ACEi, HLD who is being seen 09/27/2023 for the evaluation of pre-op eval and CHF at the request of Dr. Pearlean.  History of Present Illness:   Gregory Alvarado is followed by Dr. Alvan. He has a history of NSTEMI in 08/2020. Cath showed LM Lis, LAD prox 70-90% mid, Lcx 80% prox thrombus, OM2 85%, RCA small non dom 60%, LVEDP elevated. Treated with DES prox.mid LAD, DES to prox LCX, DES to OM2. Echo showed LVEF 35-40$, G2DD, mild MR. Repeat echo April 2022 showed recovered LVEF 55-60 WMA with the mid and distal anterior septum were akinetic. The apical and lateral segment, apical anterior segment, apical inferior segment and apex were hypokinetic.  Admitted 07/2022 for angioedema requiring intubation in the setting of ACEi. TEE showed LVEF 40-45%, and new WMA (akinetic apex, anterior wall hypokinesis). It was not felt echo findings were significantly different.  Echo from February 2024 showed EF of 50 to 55%, severe LVH, grade 1 diastolic dysfunction, mild MR, mild calcification of the aorta with mild thickening, positive wall motion abnormality.  Patient was last seen in August 2024 and was overall stable from a cardiac perspective.  The patient presented to the ER 07/26/23 for chest pain and generalized fatigue. The patient reports before Christmas he was told Hgb was 7, but he didn't want to go into the ER at that time. He reports progressive tiredness and low energy and eventually came into the ER. He also reported chest pain that was worse with exertion  and improved with SL NTG. He denies shortness of breath. He noted lower leg edema. No fever or chills.  In the ER BP 152/62, 97% on 3L O2. Labs showed WBC 12.2, Hb 6.2, plt 158, sodium 133, BG 123, albumin 3.2, BNP 637.HS trop 16>17. FOBT positive. CXR showed cardiomegaly with mild interstitial edema, small to mod left pleural effusion. EKG showed NSR 95bpm, RAD, possible short PRI, qtc , minimal ST dep inf leads. He was transfused 2 units in the ER. He was given IV lasix , PPI, duonebs and IV NTG x1.   Past Medical History:  Diagnosis Date   Anemia    Arthritis    left shoulder   COPD (chronic obstructive pulmonary disease) (HCC)    Coronary artery disease 09/11/2020   NSTEMI and 3 stents in [facility]   Diabetes mellitus without complication (HCC)    Dyslipidemia    Hypertension    Insomnia    Insomnia    Morbid obesity (HCC)    Tobacco use     Past Surgical History:  Procedure Laterality Date   BIOPSY  10/28/2019   Procedure: BIOPSY;  Surgeon: Harvey Margo CROME, MD;  [facility];  Service: Endoscopy;;  gastric   BIOPSY  04/15/2020   Procedure: BIOPSY;  Surgeon: Shaaron Lamar HERO, MD;  [facility];  Service: Endoscopy;;  gastric   CARDIAC CATHETERIZATION  08/2020   3 stents placed   COLON RESECTION  1990s?   DIVERTICULITIS  COLONOSCOPY WITH PROPOFOL  N/A 04/15/2020   Rourk: Poor colon prep. two 6 to 8 mm polyps removed from the hepatic flexure, tubular adenomas.  Repeat colonoscopy later this year.   ENTEROSCOPY N/A 11/26/2021   Procedure: ENTEROSCOPY;  Surgeon: Eartha Angelia Sieving, MD;  [facility];  Service: Gastroenterology;  Laterality: N/A;   ENTEROSCOPY N/A 03/13/2022   Procedure: ENTEROSCOPY;  Surgeon: Cindie Carlin POUR, DO;  [facility];  Service: Endoscopy;  Laterality: N/A;   ESOPHAGOGASTRODUODENOSCOPY (EGD) WITH PROPOFOL  N/A 10/28/2019   Dr. Harvey: Multiple cratered gastric ulcers, gastritis/duodenitis.  Path showed  H. pylori.  Patient treated with amoxicillin /Biaxin /PPI twice daily.   ESOPHAGOGASTRODUODENOSCOPY (EGD) WITH PROPOFOL  N/A 04/15/2020   Rourk: Patchy gastric erythema status post biopsy to document eradication of H. pylori.  Biopsy showed persistent H. pylori.  Previously noted gastric ulcer is completely healed.   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   ESOPHAGOGASTRODUODENOSCOPY (EGD) WITH PROPOFOL  N/A 03/13/2022   Procedure: ESOPHAGOGASTRODUODENOSCOPY (EGD) WITH PROPOFOL ;  Surgeon: Cindie Carlin POUR, DO;  [facility];  Service: Endoscopy;  Laterality: N/A;   HERNIA REPAIR     UHR   HOT HEMOSTASIS  11/26/2021   Procedure: HOT HEMOSTASIS (ARGON PLASMA COAGULATION/BICAP);  Surgeon: Eartha Angelia, Sieving, MD;  [facility];  Service: Gastroenterology;;   AGAPITO DILATION N/A 04/15/2020   Procedure: AGAPITO HODGKIN;  Surgeon: Shaaron Lamar HERO, MD;  [facility];  Service: Endoscopy;  Laterality: N/A;   POLYPECTOMY  04/15/2020   Procedure: POLYPECTOMY;  Surgeon: Shaaron Lamar HERO, MD;  [facility];  Service: Endoscopy;;  colon   ROTATOR CUFF REPAIR Bilateral    TYMPANOPLASTY       Home Medications:  Prior to Admission medications   Medication Sig Start Date End Date Taking? Authorizing Provider  acetaminophen  (TYLENOL ) 500 MG tablet Take 1,000 mg by mouth every 6 (six) hours as needed for mild pain.   Yes [provider]  albuterol  (VENTOLIN  HFA) 108 (90 Base) MCG/ACT inhaler Inhale 2 puffs into the lungs every 4 (four) hours as needed for wheezing or shortness of breath. 07/15/22  Yes [provider]  BREZTRI  AEROSPHERE 160-9-4.8 MCG/ACT AERO Inhale 2 puffs into the lungs 2 (two) times daily. 08/26/23  Yes [provider]  cyanocobalamin  (,VITAMIN B-12,) 1000 MCG/ML injection Inject 1,000 mcg into the muscle every 30  (thirty) days. 02/11/22  Yes [provider]  diphenhydramine -acetaminophen  (TYLENOL  PM) 25-500 MG TABS tablet Take 1 tablet by mouth at bedtime as needed (sleep).   Yes [provider]  empagliflozin  (JARDIANCE ) 10 MG TABS tablet Take 1 tablet (10 mg total) by mouth daily. 08/01/22  Yes Sheikh, Omair Latif, DO  insulin  NPH-regular Human (NOVOLIN 70/30) (70-30) 100 UNIT/ML injection Inject 40 Units into the skin in the morning and at bedtime.   Yes [provider]  levocetirizine (XYZAL ) 5 MG tablet Take 5 mg by mouth at bedtime. 03/11/22  Yes [provider]  metoprolol  succinate (TOPROL -XL) 50 MG 24 hr tablet TAKE 3 TABLETS BY MOUTH EVERY DAY 08/23/23  Yes Branch, Dorn FALCON, MD  nitroGLYCERIN  (NITROSTAT ) 0.4 MG SL tablet DISSOLVE 1 TABLET SUBLINGUALLY AS NEEDED FOR CHEST PAIN, MAY REPEAT EVERY 5 MINUTES. AFTER 3 CALL 911. 06/30/23  Yes Branch, Dorn FALCON, MD  rosuvastatin  (CRESTOR ) 40 MG tablet Take 1 tablet (40 mg total) by mouth daily. 05/07/23 05/01/24  Yes Alvan Dorn FALCON, MD  traMADol  (ULTRAM ) 50 MG tablet Take 50-100 mg by mouth in the morning and at bedtime. 09/20/19  Yes [provider]  zolpidem  (AMBIEN ) 5 MG tablet Take 5 mg by mouth at bedtime as needed for sleep. 09/26/20  Yes [provider]    Inpatient Medications: Scheduled Meds:  feeding supplement  237 mL Oral BID BM   furosemide   40 mg Intravenous Q12H   insulin  aspart  0-15 Units Subcutaneous Q4H   metoprolol  succinate  25 mg Oral BID   nitroGLYCERIN   0.4 mg Sublingual Once   pantoprazole  (PROTONIX ) IV  40 mg Intravenous Q12H   potassium chloride   40 mEq Oral Q3H   rosuvastatin   40 mg Oral Daily    PRN Meds: acetaminophen  **OR** acetaminophen , diphenhydrAMINE , ipratropium-albuterol , prochlorperazine , traMADol   Allergies:    Allergies  Allergen Reactions   Lisinopril  Swelling    ACEi angioedema requiring intubation 07/2022   Caduet [Amlodipine-Atorvastatin ] Swelling     Social History:   Social History   Tobacco Use   Smoking status: Every Day    Current packs/day: 2.00    Types: Cigarettes   Smokeless tobacco: Never  Substance Use Topics   Alcohol use: Not Currently    Comment: very rare     Family History:    Family History  Problem Relation Age of Onset   Diabetes Mother    Heart attack Mother    Heart attack Father    Heart attack Brother    Colon cancer Neg Hx    Stomach cancer Neg Hx      ROS:  Please see the history of present illness. All other ROS reviewed and negative.     Physical Exam/Data:   Vitals:   09/26/23 2109 09/27/23 0007 09/27/23 0329 09/27/23 0336  BP: 125/89 138/66 124/71   Pulse: (!) 102 96 91   Resp:  (!) 22 (!) 24   Temp:  (!) 97.4 F (36.3 C) 98.2 F (36.8 C)   TempSrc:  Oral Oral   SpO2:  94% 95%   Weight:    123.8 kg  Height:        Intake/Output Summary (Last 24 hours) at 09/27/2023 0845 Last data filed at 09/27/2023 0340 Gross per 24 hour  Intake 580 ml  Output 5150 ml  Net -4570 ml      09/27/2023    3:36 AM 09/26/2023    5:49 AM 09/25/2023    9:15 PM  Last 3 Weights  Weight (lbs) 272 lb 14.9 oz 286 lb 9.6 oz 295 lb  Weight (kg) 123.8 kg 130 kg 133.811 kg     Body mass index is 37.02 kg/m.  General:  Well nourished, well developed, in no acute distress HEENT: normal Neck: no JVD Vascular: No carotid bruits; Distal pulses 2+ bilaterally Cardiac:  normal S1, S2; RRR; no murmur  Lungs: diffusely diminished Abd: soft, nontender, no hepatomegaly  Ext: no edema Musculoskeletal:  No deformities, BUE and BLE strength normal and equal Skin: warm and dry  Neuro:  CNs 2-12 intact, no focal abnormalities noted Psych:  Normal affect   Telemetry:  Telemetry was personally reviewed and demonstrates:  SR/ST HR 90-105, PVCs  Relevant CV Studies:  Echo 09/2023  1. Left ventricular ejection fraction, by estimation, is 50 to 55%. The  left ventricle has low normal function. The left  ventricle demonstrates  regional wall motion abnormalities (see scoring diagram/findings for  description). There is mild concentric  left ventricular  hypertrophy. Left ventricular diastolic parameters are  indeterminate. Elevated left ventricular end-diastolic pressure.   2. Right ventricular systolic function is normal. The right ventricular  size is normal. Tricuspid regurgitation signal is inadequate for assessing  PA pressure.   3. Left atrial size was severely dilated.   4. Right atrial size was mildly dilated.   5. Moderate pericardial effusion. The pericardial effusion is lateral to  the left ventricle. There is no evidence of cardiac tamponade.   6. The mitral valve is normal in structure. No evidence of mitral valve  regurgitation. No evidence of mitral stenosis.   7. The aortic valve is normal in structure. Aortic valve regurgitation is  not visualized. No aortic stenosis is present.   8. The inferior vena cava is dilated in size with >50% respiratory  variability, suggesting right atrial pressure of 8 mmHg.   High Sensitivity Troponin:   Recent Labs  Lab 09/25/23 2136 09/25/23 2316  TROPONINIHS 16 17     Chemistry Recent Labs  Lab 09/25/23 2136 09/26/23 0622 09/27/23 0304  NA 133* 136 136  K 3.8 3.4* 2.9*  CL 98 96* 93*  CO2 31 33* 34*  GLUCOSE 123* 143* 122*  BUN 14 14 12   CREATININE 0.98 1.06 1.15  CALCIUM  9.0 9.1 9.1  MG  --  2.0  --   GFRNONAA >60 >60 >60  ANIONGAP 4* 7 9    Recent Labs  Lab 09/25/23 2136 09/26/23 0622  PROT 6.7 7.0  ALBUMIN 3.2* 3.4*  AST 16 14*  ALT 12 10  ALKPHOS 59 63  BILITOT 0.5 1.0   Hematology Recent Labs  Lab 09/25/23 2136 09/26/23 0622 09/27/23 0304  WBC 12.2* 11.0* 10.6*  RBC 3.06*  3.06* 3.75* 3.69*  HGB 6.2* 8.0* 7.9*  HCT 24.7* 30.1* 29.4*  MCV 80.7 80.3 79.7*  MCH 20.3* 21.3* 21.4*  MCHC 25.1* 26.6* 26.9*  RDW 19.7* 18.6* 19.1*  PLT 158 150 150   BNP Recent Labs  Lab 09/25/23 2136  BNP 637.0*     Radiology/Studies:  ECHOCARDIOGRAM COMPLETE Result Date: 09/26/2023    ECHOCARDIOGRAM REPORT   Patient Name:   KATHLEEN LIKINS Rickman Date of Exam: 09/26/2023 Medical Rec #:  [id number]     Height:       72.0 in Accession #:    [id number]    Weight:       286.6 lb Date of Birth:  04-15-1951     BSA:          2.483 m Patient Age:    72 years      BP:           157/74 mmHg Patient Gender: M             HR:           94 bpm. Exam Location:  [facility] Procedure: 2D Echo, Cardiac Doppler and Color Doppler Indications:    CHF-Acute Diastolic I50.31  History:        Patient has prior history of Echocardiogram examinations, most                 recent 10/22/2022. CHF, CAD, COPD, Signs/Symptoms:Syncope; Risk                 Factors:Hypertension, Diabetes and Dyslipidemia. Tobacco use                 (From Hx), Acute on chronic combined systolic and diastolic CHF                 (  congestive heart failure) (HCC).  Sonographer:    Aida Pizza RCS Referring Phys: [id number] OLADAPO ADEFESO  Sonographer Comments: Technically difficult study due to poor echo windows. Patient has no I.V. at this time. IMPRESSIONS  1. Left ventricular ejection fraction, by estimation, is 50 to 55%. The left ventricle has low normal function. The left ventricle demonstrates regional wall motion abnormalities (see scoring diagram/findings for description). There is mild concentric left ventricular hypertrophy. Left ventricular diastolic parameters are indeterminate. Elevated left ventricular end-diastolic pressure.  2. Right ventricular systolic function is normal. The right ventricular size is normal. Tricuspid regurgitation signal is inadequate for assessing PA pressure.  3. Left atrial size was severely dilated.  4. Right atrial size was mildly dilated.  5. Moderate pericardial effusion. The pericardial effusion is lateral to the left ventricle. There is no evidence of cardiac tamponade.  6. The mitral valve is normal in structure. No evidence of mitral  valve regurgitation. No evidence of mitral stenosis.  7. The aortic valve is normal in structure. Aortic valve regurgitation is not visualized. No aortic stenosis is present.  8. The inferior vena cava is dilated in size with >50% respiratory variability, suggesting right atrial pressure of 8 mmHg. FINDINGS  Left Ventricle: Left ventricular ejection fraction, by estimation, is 50 to 55%. The left ventricle has low normal function. The left ventricle demonstrates regional wall motion abnormalities. The left ventricular internal cavity size was normal in size. There is mild concentric left ventricular hypertrophy. Left ventricular diastolic parameters are indeterminate. Elevated left ventricular end-diastolic pressure.  LV Wall Scoring: The apical septal segment, apical inferior segment, and apex are akinetic. The entire anterior wall, entire lateral wall, anterior septum, inferior wall, mid inferoseptal segment, and basal inferoseptal segment are hypokinetic. Right Ventricle: The right ventricular size is normal. No increase in right ventricular wall thickness. Right ventricular systolic function is normal. Tricuspid regurgitation signal is inadequate for assessing PA pressure. Left Atrium: Left atrial size was severely dilated. Right Atrium: Right atrial size was mildly dilated. Pericardium: A moderately sized pericardial effusion is present. The pericardial effusion is lateral to the left ventricle. There is no evidence of cardiac tamponade. Presence of epicardial fat layer. Mitral Valve: The mitral valve is normal in structure. No evidence of mitral valve regurgitation. No evidence of mitral valve stenosis. Tricuspid Valve: The tricuspid valve is normal in structure. Tricuspid valve regurgitation is not demonstrated. No evidence of tricuspid stenosis. Aortic Valve: The aortic valve is normal in structure. Aortic valve regurgitation is not visualized. No aortic stenosis is present. Pulmonic Valve: The pulmonic  valve was normal in structure. Pulmonic valve regurgitation is not visualized. No evidence of pulmonic stenosis. Aorta: The aortic root is normal in size and structure. Venous: The inferior vena cava is dilated in size with greater than 50% respiratory variability, suggesting right atrial pressure of 8 mmHg. IAS/Shunts: No atrial level shunt detected by color flow Doppler.  LEFT VENTRICLE PLAX 2D LVIDd:         4.80 cm   Diastology LVIDs:         3.30 cm   LV e' medial:    6.31 cm/s LV PW:         1.30 cm   LV E/e' medial:  19.8 LV IVS:        1.20 cm   LV e' lateral:   6.85 cm/s LVOT diam:     2.00 cm   LV E/e' lateral: 18.2 LV SV:  71 LV SV Index:   28 LVOT Area:     3.14 cm  RIGHT VENTRICLE RV S prime:     12.30 cm/s TAPSE (M-mode): 2.5 cm LEFT ATRIUM              Index        RIGHT ATRIUM           Index LA diam:        5.20 cm  2.09 cm/m   RA Area:     23.50 cm LA Vol (A2C):   127.0 ml 51.16 ml/m  RA Volume:   69.00 ml  27.79 ml/m LA Vol (A4C):   116.0 ml 46.73 ml/m LA Biplane Vol: 129.0 ml 51.96 ml/m  AORTIC VALVE LVOT Vmax:   112.00 cm/s LVOT Vmean:  70.700 cm/s LVOT VTI:    0.225 m  AORTA Ao Root diam: 3.80 cm MITRAL VALVE MV Area (PHT): 2.66 cm     SHUNTS MV Decel Time: 285 msec     Systemic VTI:  0.22 m MV E velocity: 125.00 cm/s  Systemic Diam: 2.00 cm MV A velocity: 141.00 cm/s MV E/A ratio:  0.89 Kardie Tobb DO Electronically signed by Dub Huntsman DO Signature Date/Time: 09/26/2023/2:27:45 PM    Final    DG Chest Port 1 View Result Date: 09/25/2023 CLINICAL DATA:  Shortness of breath, history of CHF EXAM: PORTABLE CHEST 1 VIEW COMPARISON:  07/31/2022 FINDINGS: Cardiomegaly with mild interstitial edema. Small to moderate left pleural effusion. No pneumothorax. IMPRESSION: Cardiomegaly with mild interstitial edema. Small to moderate left pleural effusion. Electronically Signed   By: Pinkie Pebbles M.D.   On: 09/25/2023 21:57    Assessment and Plan:   Acute on chronic diastolic and  systolic heart failure - echo 87/7977 LVEF 40-45% - echo 10/2022 showed LVEF 50-55%, akinetic apex, severe LVH, G1DD - echo 09/2023 LVEF 50-55%, mild concentric LV, elevated LVEDP, normal RV SF, severely dilated left atrium, moderate pericardial effusion with no tamponade.  + WMA (report above) - BNP 637 - IV lasix  40mg  BID - Jardiance  held - continue Toprol  25mg  daily - Net -7.4L - PTA he was not on diuretic. Would likely require lasix  daily - appears fairly euvolemic, although difficult to tell given body habitus Continue diuresis for now and trend kidney function  Moderate pericardial effusion - echo showed moderate effusion with no tamponade - repeat limited echo  Acute on chronic symptomatic anemia - Hgb 6.2 s/p transfusion 2 units PRBCs on 09/25/23 - Hgb 7.9 today - PTA not on blood thinners - baseline Hgb 8-9 - GI following and request pre-op eval prior to endoscopy  Chest pain CAD  - s/p DES p-mLAD, des to pLCXa dn DES to OM2 in 08/2020 - patient reported chest pain prior to arrival, relieved with SL NTG - HS trop negative x 2 - EKG with minimal changes. Would repeat - no active chest pain - not on ASA due to GI issues - echo with LVEF 50-55%  Acute on chronic hypoxic respiratory failure - on 2L O2 at home - currently on 3L O2  COPD Tobacco use - smokes 2 ppd - no acute exacerbation  Chronic leukocytosis - heme follow-up as OP  Hypokalemia - K 2.9 - klor-con  has been ordered  Pre-op evaluation - presented with acute anemia s/p transfusion, volume overload and chest pain - not on blood thinners PTA - patient is needing push enteroscopy per GI - HS trop negative. BNP in the 600s and has  been diuresing well - echo showed LVEF 50-55% with WMA and moderate effusion with no tamponade. Repeat limited echo - he has had good UOP, suspect still has some volume on him. Continue with diuresis for now. He will need lasix  at d/c - METS>4 - RCRI 15% risk of MACE,  further ischemic testing unlikely to alter risk assessment - need to replete potassium  For questions or updates, please contact [facility] Please consult [email or web address] for contact info under    Signed, Cadence VEAR Fishman, PA-C  09/27/2023 8:45 AM    Attending note:  Patient seen and examined.  I reviewed his records and discussed the case with Ms. Fishman RIGGERS.  He is admitted with shortness of breath and evidence of fluid retention as well as symptomatic anemia.  He has baseline CAD as outlined above, also COPD and chronic hypoxic respiratory failure.  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  Hemoglobin initially 6.2, improved with PRBCs.  Plan is for him ultimately to undergo follow-up endoscopy.  Echocardiogram yesterday showed low normal LVEF at 50 to 55%, regional wall motion abnormalities consistent with ischemic cardiomyopathy, normal RV contraction, and a moderate pericardial effusion without obvious hemodynamic significance.  On examination today he appears comfortable, no shortness of breath at rest, no chest pain.  He is afebrile, heart rate in the 90s in sinus rhythm by telemetry, blood pressure 130/69.  Lungs exhibit decreased breath sounds with prolonged expiratory phase.  Cardiac exam with RRR and no gallop.  No significant murmur.  1-2+ leg edema noted.  Pertinent lab work close potassium down to 2.9 with IV diuresis, creatinine 1.1 with GFR greater than 60, WBC 10.6, hemoglobin 7.9, platelets 150, hemoglobin A1c 5.8%, high-sensitivity troponin I 16 and 17, BNP 637.  ECG reviewed showing sinus tachycardia with lead motion artifact, nonspecific ST changes.  Chest x-ray reports cardiomegaly with mild interstitial edema, also small to moderate sized left pleural effusion.  Fluid overload in the setting of HFpEF, LVEF 50 to 55% and known history of ischemic cardiomyopathy/CAD.  No evidence of ACS.  He is responding well to IV Lasix .  He does have a left pleural effusion  and moderate-sized but hemodynamically insignificant pericardial effusion.  Plan is for follow-up endoscopy per GI service, likely stable to proceed within the next 24 to 48 hours.  We will continue to optimize fluid status, check Reds vest if possible.  Check limited echocardiogram in the next 24 to 48 hours.  Continue IV Lasix  for now along with Toprol -XL, Crestor , and potassium supplement.  Jardiance  is held temporarily.  He is not on any antiplatelet agents at this time.  Jayson JUDITHANN Sierras, M.D., F.A.C.C.

## 2023-09-27 NOTE — Progress Notes (Signed)
 Walked in hallway today.  Reports no bm for three days

## 2023-09-27 NOTE — Progress Notes (Signed)
 Gastroenterology Progress Note   Referring Provider: No ref. provider found Primary Care Physician:  Shona Norleen PEDLAR, MD Primary Gastroenterologist:  Dr.  Patient ID: Gregory Alvarado; [id number]; 1950/11/04    Subjective   Feels he is nearing his baseline respiratory status. On 3 liters currently. No abdominal pain. No overt GI bleeding.    Objective   Vital signs in last 24 hours Temp:  [97.4 F (36.3 C)-99.3 F (37.4 C)] 98.2 F (36.8 C) (01/13 0329) Pulse Rate:  [91-104] 91 (01/13 0329) Resp:  [21-24] 24 (01/13 0329) BP: (124-142)/(49-89) 124/71 (01/13 0329) SpO2:  [90 %-99 %] 95 % (01/13 0329) Weight:  [123.8 kg] 123.8 kg (01/13 0336) Last BM Date : 09/24/23  Physical Exam General:   Alert and oriented, chronically ill-appearing, 3 liters nasal cannula  Abdomen:  Bowel sounds present, obese, mild TTP epigastric area  Neurologic:  Alert and  oriented x4  Intake/Output from previous day: 01/12 0701 - 01/13 0700 In: 580 [P.O.:580] Out: 5150 [Urine:5150] Intake/Output this shift: No intake/output data recorded.  Lab Results  Recent Labs    09/25/23 2136 09/26/23 0622 09/27/23 0304  WBC 12.2* 11.0* 10.6*  HGB 6.2* 8.0* 7.9*  HCT 24.7* 30.1* 29.4*  PLT 158 150 150   BMET Recent Labs    09/25/23 2136 09/26/23 0622 09/27/23 0304  NA 133* 136 136  K 3.8 3.4* 2.9*  CL 98 96* 93*  CO2 31 33* 34*  GLUCOSE 123* 143* 122*  BUN 14 14 12   CREATININE 0.98 1.06 1.15  CALCIUM  9.0 9.1 9.1   LFT Recent Labs    09/25/23 2136 09/26/23 0622  PROT 6.7 7.0  ALBUMIN 3.2* 3.4*  AST 16 14*  ALT 12 10  ALKPHOS 59 63  BILITOT 0.5 1.0    Studies/Results ECHOCARDIOGRAM COMPLETE Result Date: 09/26/2023    ECHOCARDIOGRAM REPORT   [name] Date of Exam: 09/26/2023 Medical Rec #:  [id number]     Height:       72.0 in Accession #:    [id number]    Weight:       286.6 lb Date of Birth:  09/11/1951     BSA:          2.483 m Patient Age:    72 years       BP:           157/74 mmHg Patient Gender: M             HR:           94 bpm. Exam Location:  [facility] Procedure: 2D Echo, Cardiac Doppler and Color Doppler Indications:    CHF-Acute Diastolic I50.31  History:        Patient has prior history of Echocardiogram examinations, most                 recent 10/22/2022. CHF, CAD, COPD, Signs/Symptoms:Syncope; Risk                 Factors:Hypertension, Diabetes and Dyslipidemia. Tobacco use                 (From Hx), Acute on chronic combined systolic and diastolic CHF                 (congestive heart failure) (HCC).  Sonographer:    Aida Pizza RCS Referring Phys: [id number] OLADAPO ADEFESO  Sonographer Comments: Technically difficult study due to poor echo windows. Patient has no I.V.  at this time. IMPRESSIONS  1. Left ventricular ejection fraction, by estimation, is 50 to 55%. The left ventricle has low normal function. The left ventricle demonstrates regional wall motion abnormalities (see scoring diagram/findings for description). There is mild concentric left ventricular hypertrophy. Left ventricular diastolic parameters are indeterminate. Elevated left ventricular end-diastolic pressure.  2. Right ventricular systolic function is normal. The right ventricular size is normal. Tricuspid regurgitation signal is inadequate for assessing PA pressure.  3. Left atrial size was severely dilated.  4. Right atrial size was mildly dilated.  5. Moderate pericardial effusion. The pericardial effusion is lateral to the left ventricle. There is no evidence of cardiac tamponade.  6. The mitral valve is normal in structure. No evidence of mitral valve regurgitation. No evidence of mitral stenosis.  7. The aortic valve is normal in structure. Aortic valve regurgitation is not visualized. No aortic stenosis is present.  8. The inferior vena cava is dilated in size with >50% respiratory variability, suggesting right atrial pressure of 8 mmHg. FINDINGS  Left Ventricle: Left ventricular  ejection fraction, by estimation, is 50 to 55%. The left ventricle has low normal function. The left ventricle demonstrates regional wall motion abnormalities. The left ventricular internal cavity size was normal in size. There is mild concentric left ventricular hypertrophy. Left ventricular diastolic parameters are indeterminate. Elevated left ventricular end-diastolic pressure.  LV Wall Scoring: The apical septal segment, apical inferior segment, and apex are akinetic. The entire anterior wall, entire lateral wall, anterior septum, inferior wall, mid inferoseptal segment, and basal inferoseptal segment are hypokinetic. Right Ventricle: The right ventricular size is normal. No increase in right ventricular wall thickness. Right ventricular systolic function is normal. Tricuspid regurgitation signal is inadequate for assessing PA pressure. Left Atrium: Left atrial size was severely dilated. Right Atrium: Right atrial size was mildly dilated. Pericardium: A moderately sized pericardial effusion is present. The pericardial effusion is lateral to the left ventricle. There is no evidence of cardiac tamponade. Presence of epicardial fat layer. Mitral Valve: The mitral valve is normal in structure. No evidence of mitral valve regurgitation. No evidence of mitral valve stenosis. Tricuspid Valve: The tricuspid valve is normal in structure. Tricuspid valve regurgitation is not demonstrated. No evidence of tricuspid stenosis. Aortic Valve: The aortic valve is normal in structure. Aortic valve regurgitation is not visualized. No aortic stenosis is present. Pulmonic Valve: The pulmonic valve was normal in structure. Pulmonic valve regurgitation is not visualized. No evidence of pulmonic stenosis. Aorta: The aortic root is normal in size and structure. Venous: The inferior vena cava is dilated in size with greater than 50% respiratory variability, suggesting right atrial pressure of 8 mmHg. IAS/Shunts: No atrial level shunt  detected by color flow Doppler.  LEFT VENTRICLE PLAX 2D LVIDd:         4.80 cm   Diastology LVIDs:         3.30 cm   LV e' medial:    6.31 cm/s LV PW:         1.30 cm   LV E/e' medial:  19.8 LV IVS:        1.20 cm   LV e' lateral:   6.85 cm/s LVOT diam:     2.00 cm   LV E/e' lateral: 18.2 LV SV:         71 LV SV Index:   28 LVOT Area:     3.14 cm  RIGHT VENTRICLE RV S prime:     12.30 cm/s TAPSE (M-mode): 2.5  cm LEFT ATRIUM              Index        RIGHT ATRIUM           Index LA diam:        5.20 cm  2.09 cm/m   RA Area:     23.50 cm LA Vol (A2C):   127.0 ml 51.16 ml/m  RA Volume:   69.00 ml  27.79 ml/m LA Vol (A4C):   116.0 ml 46.73 ml/m LA Biplane Vol: 129.0 ml 51.96 ml/m  AORTIC VALVE LVOT Vmax:   112.00 cm/s LVOT Vmean:  70.700 cm/s LVOT VTI:    0.225 m  AORTA Ao Root diam: 3.80 cm MITRAL VALVE MV Area (PHT): 2.66 cm     SHUNTS MV Decel Time: 285 msec     Systemic VTI:  0.22 m MV E velocity: 125.00 cm/s  Systemic Diam: 2.00 cm MV A velocity: 141.00 cm/s MV E/A ratio:  0.89 Kardie Tobb DO Electronically signed by Dub Huntsman DO Signature Date/Time: 09/26/2023/2:27:45 PM    Final    DG Chest Port 1 View Result Date: 09/25/2023 CLINICAL DATA:  Shortness of breath, history of CHF EXAM: PORTABLE CHEST 1 VIEW COMPARISON:  07/31/2022 FINDINGS: Cardiomegaly with mild interstitial edema. Small to moderate left pleural effusion. No pneumothorax. IMPRESSION: Cardiomegaly with mild interstitial edema. Small to moderate left pleural effusion. Electronically Signed   By: Pinkie Pebbles M.D.   On: 09/25/2023 21:57    Assessment  73 y.o. male with a history of chronic systolic and diastolic CHF, CAD with DES (12/21), type 2 diabetes mellitus, hyperlipidemia, GI bleed, COPD, chronic respiratory failure on supplemental oxygen  at 2-3 liters as outpatient, recurrent AVMs, presenting with worsening chest pain, DOE, and admitted with acute on chronic CHF. GI consulted due to chronic  melena and worsening anemia.    Acute on chronic anemia: Hgb 6.2 on admission, s/p 2 units of PRBCs with appropriate response. Hgb 7.9 today but is without overt GI bleeding. Last EGD March 2023 with gastric AVM, duodenal and jejunal AVMs s/p APC. Colonoscopy 2021 with inadequate prep and two polyps.   Cardiology is on board and managing acute CHF. Discussed with Dr. Debera. Patient may be optimized in next day or two. For now, we will reassess daily.     Plan / Recommendations  Continue PPI BID NPO after midnight Reassess in am Consider standostatin therapy in future    LOS: 2 days    09/27/2023, 9:20 AM  Therisa MICAEL Stager, PhD, [facility]

## 2023-09-27 NOTE — Progress Notes (Signed)
 Mobility Specialist Progress Note:    09/27/23 1430  Mobility  Activity Ambulated with assistance in hallway  Level of Assistance Minimal assist, patient does 75% or more  Assistive Device Front wheel walker  Distance Ambulated (ft) 60 ft  Range of Motion/Exercises Active;All extremities  Activity Response Tolerated well  Mobility Referral Yes  Mobility visit 1 Mobility  Mobility Specialist Start Time (ACUTE ONLY) 1410  Mobility Specialist Stop Time (ACUTE ONLY) 1430  Mobility Specialist Time Calculation (min) (ACUTE ONLY) 20 min   Pt received in chair, agreeable to mobility. Required MinA to stand and CGA to ambulate with RW. Tolerated well, c/o SOB. SpO2 96% on 3L. Left pt in chair, call bell in reach. All needs met.   Sherrilee Ditty Mobility Specialist Please contact via Special Educational Needs Teacher or  Rehab office at [phone]

## 2023-09-27 NOTE — Progress Notes (Signed)
 PROGRESS NOTE  Gregory Alvarado, is a 73 y.o. male, DOB - [birth year], FMW:[id number]  Admit date - 09/25/2023   Admitting Physician Posey Maier, DO  Outpatient Primary MD for the patient is Shona Norleen PEDLAR, MD  LOS - 2  Chief Complaint  Patient presents with   Chest Pain      Brief Narrative:  -73 y.o. male  with medical history significant of chronic systolic and diastolic CHF, CAD with DES (12/21), type 2 diabetes mellitus, hyperlipidemia, GI bleed, COPD, chronic respiratory failure on supplemental oxygen  at 3 - 4 LPM of oxygen , tobacco abuse  and recurrent AVMs admitted on 09/25/2023 with acute on chronic combined systolic and diastolic dysfunction CHF and concerns about possible ongoing GI bleed with acute on chronic symptomatic anemia    -Assessment and Plan: 1) acute on chronic combined diastolic and systolic dysfunction CHF/ICM Echo from 10/22/2022 showed a recovery of the heart with LVEF of 50 to 55% (from 40 to 45% on 08/28/2021). Apex is akinetic. Positive RWMA. Severe LVH. G1 DD.  -Repeat echo from 09/26/2023 with EF of 50 to 55% with wall motion abnormalities (The apical septal segment, apical inferior segment, and apex are akinetic. The entire anterior wall, entire lateral wall, anterior septum, inferior wall, mid inferoseptal segment, and basal inferoseptal segment are hypokinetic.) -- Continue IV Lasix  and metoprolol  -Hold Jardiance  -Daily weight, I's and O's and Reds vest   cardiology consult requested  2) acute on chronic symptomatic anemia-- -hemoglobin is up to 8.0 from 6.2 after transfusion of 2 units of PRBC on 09/25/2023 -Baseline hemoglobin usually between 8 and 9 --Continue IV Protonix  -EGD from 2020 with AVMs in the stomach and duodenum as well as jejunum treated with APC at that time -GI consult appreciated -Cardiology consult for preop clearance appreciated-- --possible endoscopy in 1 to 2 days if volume status improves  3) CAD/ICM ---s/p  DES to prox/mid LAD, DES  to prox LCX, DES to OM2. In December 2021 -Avoid aspirin  due to GI bleed concerns -Metoprolol  and Crestor  as ordered  4)COPD--no significant exhibition, continue with bronchodilators  5) acute on chronic hypoxic respiratory failure--multifactorial due to CHF and COPD -Currently requiring 2 L of oxygen  via nasal cannula -At baseline uses 2 L  6)Tobacco Abuse--- smokes 2 packs/day Smoking cessation counseling for 4 minutes today,  nicotine  patch I have discussed tobacco cessation with the patient.  I have counseled the patient regarding the negative impacts of continued tobacco use including but not limited to lung cancer, COPD, and cardiovascular disease.  I have discussed alternatives to tobacco and modalities that may help facilitate tobacco cessation including but not limited to biofeedback, hypnosis, and medications.  Total time spent with tobacco counseling was 4 minutes.  7)Class 2 Obesity- -Low calorie diet, portion control and increase physical activity discussed with patient -Body mass index is 37.02 kg/m.  8) chronic leukocytosis----review of record reveals mild chronic leukocytosis ??  Etiology -- Consider outpatient hematology follow-up postdischarge  9)DM2--- A1C 5.8 --reflecting excellent diabetic control PTA Hold Jardiance  Use Novolog /Humalog Sliding scale insulin  with Accu-Cheks/Fingersticks as ordered  10)Moderate pericardial effusion--- no tamponade  -cardiology recommends repeat limited echo  Status is: Inpatient   Disposition: The patient is from: Home              Anticipated d/c is to: Home              Anticipated d/c date is: 2 days  Patient currently is not medically stable to d/c. Barriers: Not Clinically Stable-   Code Status :  -  Code Status: Full Code   Family Communication:    NA (patient is alert, awake and coherent)   DVT Prophylaxis  :   - SCDs  SCDs Start: 09/26/23 0308   Lab Results  Component Value Date   PLT 150  09/27/2023   Inpatient Medications  Scheduled Meds:  feeding supplement  237 mL Oral BID BM   furosemide   40 mg Intravenous Q12H   insulin  aspart  0-15 Units Subcutaneous Q4H   metoprolol  succinate  25 mg Oral BID   nitroGLYCERIN   0.4 mg Sublingual Once   pantoprazole  (PROTONIX ) IV  40 mg Intravenous Q12H   rosuvastatin   40 mg Oral Daily   Continuous Infusions: PRN Meds:.acetaminophen  **OR** acetaminophen , diphenhydrAMINE , ipratropium-albuterol , prochlorperazine , traMADol    Anti-infectives (From admission, onward)    None       Subjective: Gregory Alvarado today has no fevers, no emesis,  No chest pain,   - -Per patient dyspnea improving  Objective: Vitals:   09/27/23 0007 09/27/23 0329 09/27/23 0336 09/27/23 1234  BP: 138/66 124/71  130/69  Pulse: 96 91    Resp: (!) 22 (!) 24  17  Temp: (!) 97.4 F (36.3 C) 98.2 F (36.8 C)  98.7 F (37.1 C)  TempSrc: Oral Oral    SpO2: 94% 95%  97%  Weight:   123.8 kg   Height:        Intake/Output Summary (Last 24 hours) at 09/27/2023 1826 Last data filed at 09/27/2023 1824 Gross per 24 hour  Intake 1440 ml  Output 3350 ml  Net -1910 ml   Filed Weights   09/25/23 2115 09/26/23 0549 09/27/23 0336  Weight: 133.8 kg 130 kg 123.8 kg    Physical Exam Gen:- Awake Alert,  in no apparent distress  HEENT:- [place].AT, No sclera icterus Nose- [place] 3L/min Neck-Supple Neck,No JVD,.  Lungs-no wheezing, fair symmetrical air movement CV- S1, S2 normal, regular  Abd-  +ve B.Sounds, Abd Soft, No tenderness,    Extremity/Skin:- +ve   edema, pedal pulses present  Psych-affect is appropriate, oriented x3 Neuro-generalized weakness, no new focal deficits, no tremors  Data Reviewed: I have personally reviewed following labs and imaging studies  CBC: Recent Labs  Lab 09/25/23 2136 09/26/23 0622 09/27/23 0304  WBC 12.2* 11.0* 10.6*  NEUTROABS 10.2*  --   --   HGB 6.2* 8.0* 7.9*  HCT 24.7* 30.1* 29.4*  MCV 80.7 80.3 79.7*  PLT 158 150  150   Basic Metabolic Panel: Recent Labs  Lab 09/25/23 2136 09/26/23 0622 09/27/23 0304  NA 133* 136 136  K 3.8 3.4* 2.9*  CL 98 96* 93*  CO2 31 33* 34*  GLUCOSE 123* 143* 122*  BUN 14 14 12   CREATININE 0.98 1.06 1.15  CALCIUM  9.0 9.1 9.1  MG  --  2.0  --   PHOS  --  2.2*  --    GFR: Estimated Creatinine Clearance: 81.2 mL/min (by C-G formula based on SCr of 1.15 mg/dL). Liver Function Tests: Recent Labs  Lab 09/25/23 2136 09/26/23 0622  AST 16 14*  ALT 12 10  ALKPHOS 59 63  BILITOT 0.5 1.0  PROT 6.7 7.0  ALBUMIN 3.2* 3.4*   HbA1C: Recent Labs    09/26/23 0622  HGBA1C 5.8*   Radiology Studies: ECHOCARDIOGRAM COMPLETE Result Date: 09/26/2023    ECHOCARDIOGRAM REPORT   Patient Name:   TAYQUAN GASSMAN  Reeb Date of Exam: 09/26/2023 Medical Rec #:  [id number]     Height:       72.0 in Accession #:    [id number]    Weight:       286.6 lb Date of Birth:  [birth year]     BSA:          2.483 m Patient Age:    72 years      BP:           157/74 mmHg Patient Gender: M             HR:           94 bpm. Exam Location:  [facility] Procedure: 2D Echo, Cardiac Doppler and Color Doppler Indications:    CHF-Acute Diastolic I50.31  History:        Patient has prior history of Echocardiogram examinations, most                 recent 10/22/2022. CHF, CAD, COPD, Signs/Symptoms:Syncope; Risk                 Factors:Hypertension, Diabetes and Dyslipidemia. Tobacco use                 (From Hx), Acute on chronic combined systolic and diastolic CHF                 (congestive heart failure) (HCC).  Sonographer:    Aida Pizza RCS Referring Phys: [id number] OLADAPO ADEFESO  Sonographer Comments: Technically difficult study due to poor echo windows. Patient has no I.V. at this time. IMPRESSIONS  1. Left ventricular ejection fraction, by estimation, is 50 to 55%. The left ventricle has low normal function. The left ventricle demonstrates regional wall motion abnormalities (see scoring diagram/findings for  description). There is mild concentric left ventricular hypertrophy. Left ventricular diastolic parameters are indeterminate. Elevated left ventricular end-diastolic pressure.  2. Right ventricular systolic function is normal. The right ventricular size is normal. Tricuspid regurgitation signal is inadequate for assessing PA pressure.  3. Left atrial size was severely dilated.  4. Right atrial size was mildly dilated.  5. Moderate pericardial effusion. The pericardial effusion is lateral to the left ventricle. There is no evidence of cardiac tamponade.  6. The mitral valve is normal in structure. No evidence of mitral valve regurgitation. No evidence of mitral stenosis.  7. The aortic valve is normal in structure. Aortic valve regurgitation is not visualized. No aortic stenosis is present.  8. The inferior vena cava is dilated in size with >50% respiratory variability, suggesting right atrial pressure of 8 mmHg. FINDINGS  Left Ventricle: Left ventricular ejection fraction, by estimation, is 50 to 55%. The left ventricle has low normal function. The left ventricle demonstrates regional wall motion abnormalities. The left ventricular internal cavity size was normal in size. There is mild concentric left ventricular hypertrophy. Left ventricular diastolic parameters are indeterminate. Elevated left ventricular end-diastolic pressure.  LV Wall Scoring: The apical septal segment, apical inferior segment, and apex are akinetic. The entire anterior wall, entire lateral wall, anterior septum, inferior wall, mid inferoseptal segment, and basal inferoseptal segment are hypokinetic. Right Ventricle: The right ventricular size is normal. No increase in right ventricular wall thickness. Right ventricular systolic function is normal. Tricuspid regurgitation signal is inadequate for assessing PA pressure. Left Atrium: Left atrial size was severely dilated. Right Atrium: Right atrial size was mildly dilated. Pericardium: A  moderately sized pericardial effusion is present. The pericardial effusion is lateral to  the left ventricle. There is no evidence of cardiac tamponade. Presence of epicardial fat layer. Mitral Valve: The mitral valve is normal in structure. No evidence of mitral valve regurgitation. No evidence of mitral valve stenosis. Tricuspid Valve: The tricuspid valve is normal in structure. Tricuspid valve regurgitation is not demonstrated. No evidence of tricuspid stenosis. Aortic Valve: The aortic valve is normal in structure. Aortic valve regurgitation is not visualized. No aortic stenosis is present. Pulmonic Valve: The pulmonic valve was normal in structure. Pulmonic valve regurgitation is not visualized. No evidence of pulmonic stenosis. Aorta: The aortic root is normal in size and structure. Venous: The inferior vena cava is dilated in size with greater than 50% respiratory variability, suggesting right atrial pressure of 8 mmHg. IAS/Shunts: No atrial level shunt detected by color flow Doppler.  LEFT VENTRICLE PLAX 2D LVIDd:         4.80 cm   Diastology LVIDs:         3.30 cm   LV e' medial:    6.31 cm/s LV PW:         1.30 cm   LV E/e' medial:  19.8 LV IVS:        1.20 cm   LV e' lateral:   6.85 cm/s LVOT diam:     2.00 cm   LV E/e' lateral: 18.2 LV SV:         71 LV SV Index:   28 LVOT Area:     3.14 cm  RIGHT VENTRICLE RV S prime:     12.30 cm/s TAPSE (M-mode): 2.5 cm LEFT ATRIUM              Index        RIGHT ATRIUM           Index LA diam:        5.20 cm  2.09 cm/m   RA Area:     23.50 cm LA Vol (A2C):   127.0 ml 51.16 ml/m  RA Volume:   69.00 ml  27.79 ml/m LA Vol (A4C):   116.0 ml 46.73 ml/m LA Biplane Vol: 129.0 ml 51.96 ml/m  AORTIC VALVE LVOT Vmax:   112.00 cm/s LVOT Vmean:  70.700 cm/s LVOT VTI:    0.225 m  AORTA Ao Root diam: 3.80 cm MITRAL VALVE MV Area (PHT): 2.66 cm     SHUNTS MV Decel Time: 285 msec     Systemic VTI:  0.22 m MV E velocity: 125.00 cm/s  Systemic Diam: 2.00 cm MV A velocity:  141.00 cm/s MV E/A ratio:  0.89 Kardie Tobb DO Electronically signed by Dub Huntsman DO Signature Date/Time: 09/26/2023/2:27:45 PM    Final    DG Chest Port 1 View Result Date: 09/25/2023 CLINICAL DATA:  Shortness of breath, history of CHF EXAM: PORTABLE CHEST 1 VIEW COMPARISON:  07/31/2022 FINDINGS: Cardiomegaly with mild interstitial edema. Small to moderate left pleural effusion. No pneumothorax. IMPRESSION: Cardiomegaly with mild interstitial edema. Small to moderate left pleural effusion. Electronically Signed   By: Pinkie Pebbles M.D.   On: 09/25/2023 21:57   Scheduled Meds:  feeding supplement  237 mL Oral BID BM   furosemide   40 mg Intravenous Q12H   insulin  aspart  0-15 Units Subcutaneous Q4H   metoprolol  succinate  25 mg Oral BID   nitroGLYCERIN   0.4 mg Sublingual Once   pantoprazole  (PROTONIX ) IV  40 mg Intravenous Q12H   rosuvastatin   40 mg Oral Daily   Continuous Infusions:   LOS: 2  days   Rendall Carwin M.D on 09/27/2023 at 6:26 PM  Go to [email or web address] - for contact info  [facility] - Office  [phone]  If 7PM-7AM, please contact night-coverage [email or web address] 09/27/2023, 6:26 PM

## 2023-09-27 NOTE — Progress Notes (Signed)
   09/27/23 1400  ReDS Vest / Clip  Station Marker D  Ruler Value 38  ReDS Value Range < 36  ReDS Actual Value 32

## 2023-09-28 ENCOUNTER — Inpatient Hospital Stay (HOSPITAL_COMMUNITY): Payer: PPO | Admitting: Anesthesiology

## 2023-09-28 ENCOUNTER — Encounter (HOSPITAL_COMMUNITY)
Admission: EM | Disposition: A | Discharge status: Home / Self Care | Payer: Self-pay | Source: Home / Self Care | Attending: Family Medicine

## 2023-09-28 DIAGNOSIS — K31819 Angiodysplasia of stomach and duodenum without bleeding: Secondary | ICD-10-CM | POA: Diagnosis not present

## 2023-09-28 DIAGNOSIS — Q2733 Arteriovenous malformation of digestive system vessel: Secondary | ICD-10-CM

## 2023-09-28 DIAGNOSIS — E119 Type 2 diabetes mellitus without complications: Secondary | ICD-10-CM

## 2023-09-28 DIAGNOSIS — J449 Chronic obstructive pulmonary disease, unspecified: Secondary | ICD-10-CM

## 2023-09-28 DIAGNOSIS — I5043 Acute on chronic combined systolic (congestive) and diastolic (congestive) heart failure: Secondary | ICD-10-CM | POA: Diagnosis not present

## 2023-09-28 DIAGNOSIS — I2510 Atherosclerotic heart disease of native coronary artery without angina pectoris: Secondary | ICD-10-CM | POA: Diagnosis not present

## 2023-09-28 DIAGNOSIS — F17210 Nicotine dependence, cigarettes, uncomplicated: Secondary | ICD-10-CM

## 2023-09-28 DIAGNOSIS — I3139 Other pericardial effusion (noninflammatory): Secondary | ICD-10-CM | POA: Diagnosis not present

## 2023-09-28 DIAGNOSIS — D509 Iron deficiency anemia, unspecified: Secondary | ICD-10-CM | POA: Diagnosis not present

## 2023-09-28 DIAGNOSIS — K921 Melena: Secondary | ICD-10-CM | POA: Diagnosis not present

## 2023-09-28 HISTORY — PX: HOT HEMOSTASIS: SHX5433

## 2023-09-28 HISTORY — PX: ENTEROSCOPY: SHX5533

## 2023-09-28 LAB — CBC
HCT: 29.6 % — ABNORMAL LOW (ref 39.0–52.0)
Hemoglobin: 7.6 g/dL — ABNORMAL LOW (ref 13.0–17.0)
MCH: 20.6 pg — ABNORMAL LOW (ref 26.0–34.0)
MCHC: 25.7 g/dL — ABNORMAL LOW (ref 30.0–36.0)
MCV: 80.2 fL (ref 80.0–100.0)
Platelets: 159 10*3/uL (ref 150–400)
RBC: 3.69 MIL/uL — ABNORMAL LOW (ref 4.22–5.81)
RDW: 19.1 % — ABNORMAL HIGH (ref 11.5–15.5)
WBC: 10.8 10*3/uL — ABNORMAL HIGH (ref 4.0–10.5)
nRBC: 0 % (ref 0.0–0.2)

## 2023-09-28 LAB — RENAL FUNCTION PANEL
Albumin: 3.2 g/dL — ABNORMAL LOW (ref 3.5–5.0)
Anion gap: 8 (ref 5–15)
BUN: 15 mg/dL (ref 8–23)
CO2: 35 mmol/L — ABNORMAL HIGH (ref 22–32)
Calcium: 9 mg/dL (ref 8.9–10.3)
Chloride: 92 mmol/L — ABNORMAL LOW (ref 98–111)
Creatinine, Ser: 1.19 mg/dL (ref 0.61–1.24)
GFR, Estimated: >60 mL/min (ref >60)
Glucose, Bld: 143 mg/dL — ABNORMAL HIGH (ref 70–99)
Phosphorus: 2.6 mg/dL (ref 2.5–4.6)
Potassium: 3.2 mmol/L — ABNORMAL LOW (ref 3.5–5.1)
Sodium: 135 mmol/L (ref 135–145)

## 2023-09-28 LAB — GLUCOSE, CAPILLARY
Glucose-Capillary: 133 mg/dL — ABNORMAL HIGH (ref 70–99)
Glucose-Capillary: 135 mg/dL — ABNORMAL HIGH (ref 70–99)
Glucose-Capillary: 144 mg/dL — ABNORMAL HIGH (ref 70–99)
Glucose-Capillary: 148 mg/dL — ABNORMAL HIGH (ref 70–99)
Glucose-Capillary: 158 mg/dL — ABNORMAL HIGH (ref 70–99)
Glucose-Capillary: 165 mg/dL — ABNORMAL HIGH (ref 70–99)
Glucose-Capillary: 233 mg/dL — ABNORMAL HIGH (ref 70–99)

## 2023-09-28 SURGERY — ENTEROSCOPY
Anesthesia: General

## 2023-09-28 MED ORDER — OCTREOTIDE ACETATE 100 MCG/ML IJ SOLN
100.0000 ug | Freq: Two times a day (BID) | INTRAMUSCULAR | Status: DC
  Administered 2023-09-28 – 2023-09-30 (×4): 100 ug via SUBCUTANEOUS
  Filled 2023-09-28 (×7): qty 1

## 2023-09-28 MED ORDER — STERILE WATER FOR IRRIGATION IR SOLN
Status: DC | PRN
  Administered 2023-09-28: 50 mL

## 2023-09-28 MED ORDER — MELATONIN 3 MG PO TABS
6.0000 mg | ORAL_TABLET | Freq: Once | ORAL | Status: AC
  Administered 2023-09-28: 6 mg via ORAL
  Filled 2023-09-28: qty 2

## 2023-09-28 MED ORDER — PROPOFOL 500 MG/50ML IV EMUL
INTRAVENOUS | Status: DC | PRN
  Administered 2023-09-28: 125 ug/kg/min via INTRAVENOUS

## 2023-09-28 MED ORDER — HYDROMORPHONE HCL 1 MG/ML IJ SOLN
0.5000 mg | Freq: Once | INTRAMUSCULAR | Status: AC | PRN
  Administered 2023-09-28: 0.5 mg via INTRAVENOUS
  Filled 2023-09-28: qty 0.5

## 2023-09-28 MED ORDER — SODIUM CHLORIDE 0.9% FLUSH: 3.0000 mL | INTRAVENOUS | Status: DC | PRN

## 2023-09-28 MED ORDER — POTASSIUM CHLORIDE CRYS ER 20 MEQ PO TBCR
40.0000 meq | EXTENDED_RELEASE_TABLET | Freq: Once | ORAL | Status: AC
  Administered 2023-09-28: 40 meq via ORAL
  Filled 2023-09-28: qty 2

## 2023-09-28 MED ORDER — NYSTATIN 100000 UNIT/GM EX POWD
Freq: Two times a day (BID) | CUTANEOUS | Status: DC
  Administered 2023-09-29: 1 via TOPICAL
  Filled 2023-09-28 (×2): qty 15

## 2023-09-28 MED ORDER — PROPOFOL 10 MG/ML IV BOLUS
INTRAVENOUS | Status: DC | PRN
  Administered 2023-09-28: 30 mg via INTRAVENOUS
  Administered 2023-09-28: 80 mg via INTRAVENOUS

## 2023-09-28 MED ORDER — LIDOCAINE HCL (PF) 2 % IJ SOLN
INTRAMUSCULAR | Status: AC
  Filled 2023-09-28: qty 5

## 2023-09-28 MED ORDER — LACTATED RINGERS IV SOLN: INTRAVENOUS | Status: DC | PRN

## 2023-09-28 MED ORDER — IPRATROPIUM-ALBUTEROL 0.5-2.5 (3) MG/3ML IN SOLN
3.0000 mL | Freq: Three times a day (TID) | RESPIRATORY_TRACT | Status: DC
  Administered 2023-09-28 – 2023-09-30 (×6): 3 mL via RESPIRATORY_TRACT
  Filled 2023-09-28 (×6): qty 3

## 2023-09-28 MED ORDER — POLYETHYLENE GLYCOL 3350 17 G PO PACK
17.0000 g | PACK | Freq: Every day | ORAL | Status: DC
  Administered 2023-09-29 – 2023-09-30 (×2): 17 g via ORAL
  Filled 2023-09-28 (×3): qty 1

## 2023-09-28 MED ORDER — GLUCAGON HCL RDNA (DIAGNOSTIC) 1 MG IJ SOLR
INTRAMUSCULAR | Status: AC
  Filled 2023-09-28: qty 1

## 2023-09-28 MED ORDER — SODIUM CHLORIDE 0.9% FLUSH: 3.0000 mL | Freq: Two times a day (BID) | INTRAVENOUS | Status: DC

## 2023-09-28 MED ORDER — SENNOSIDES-DOCUSATE SODIUM 8.6-50 MG PO TABS
2.0000 | ORAL_TABLET | Freq: Every day | ORAL | Status: DC
Start: 2023-09-28 — End: 2023-09-30
  Administered 2023-09-28 – 2023-09-29 (×2): 2 via ORAL
  Filled 2023-09-28 (×2): qty 2

## 2023-09-28 MED ORDER — GLUCAGON HCL RDNA (DIAGNOSTIC) 1 MG IJ SOLR
INTRAMUSCULAR | Status: DC | PRN
  Administered 2023-09-28: .25 mg via INTRAVENOUS
  Administered 2023-09-28: .35 mg via INTRAVENOUS

## 2023-09-28 MED ORDER — PROPOFOL 500 MG/50ML IV EMUL
INTRAVENOUS | Status: AC
  Filled 2023-09-28: qty 50

## 2023-09-28 MED ORDER — PROPOFOL 10 MG/ML IV BOLUS
INTRAVENOUS | Status: AC
  Filled 2023-09-28: qty 20

## 2023-09-28 MED ORDER — ALBUTEROL SULFATE (2.5 MG/3ML) 0.083% IN NEBU
2.5000 mg | INHALATION_SOLUTION | Freq: Once | RESPIRATORY_TRACT | Status: AC
  Administered 2023-09-28: 2.5 mg via RESPIRATORY_TRACT
  Filled 2023-09-28: qty 3

## 2023-09-28 MED ORDER — SODIUM CHLORIDE 0.9 % IV SOLN: INTRAVENOUS | Status: DC

## 2023-09-28 MED ORDER — LIDOCAINE HCL (CARDIAC) PF 100 MG/5ML IV SOSY
PREFILLED_SYRINGE | INTRAVENOUS | Status: DC | PRN
  Administered 2023-09-28: 100 mg via INTRAVENOUS

## 2023-09-28 NOTE — Progress Notes (Signed)
 Mobility Specialist Progress Note:    09/28/23 1230  Mobility  Activity Ambulated with assistance in hallway;Transferred from chair to bed  Level of Assistance Standby assist, set-up cues, supervision of patient - no hands on  Assistive Device Front wheel walker  Distance Ambulated (ft) 50 ft  Range of Motion/Exercises Active;All extremities  Activity Response Tolerated well  Mobility Referral Yes  Mobility visit 1 Mobility  Mobility Specialist Start Time (ACUTE ONLY) 1215  Mobility Specialist Stop Time (ACUTE ONLY) 1230  Mobility Specialist Time Calculation (min) (ACUTE ONLY) 15 min   Pt received attempting to stand from chair with no assistance, agreeable to further mobility. Required SBA to stand and ambulate with RW. Tolerated well, c/o weakness. Left pt sitting EOB, all needs met.  Sherrilee Ditty Mobility Specialist Please contact via Special Educational Needs Teacher or  Rehab office at [phone]

## 2023-09-28 NOTE — Anesthesia Preprocedure Evaluation (Signed)
 Anesthesia Evaluation  Patient identified by MRN, date of birth, ID band Patient awake    Reviewed: Allergy & Precautions, H&P , NPO status , Patient's Chart, lab work & pertinent test results, reviewed documented beta blocker date and time   Airway Mallampati: II  TM Distance: >3 FB Neck ROM: full    Dental  (+) Edentulous Upper, Edentulous Lower   Pulmonary COPD, Current Smoker and Patient abstained from smoking. History of respiratory failure   Pulmonary exam normal breath sounds clear to auscultation       Cardiovascular Exercise Tolerance: Good hypertension, + angina  + CAD, + Cardiac Stents and +CHF  Normal cardiovascular exam Rhythm:regular Rate:Normal  Systolic and diastolic CHF. EF 55% on last echo   Neuro/Psych negative neurological ROS  negative psych ROS   GI/Hepatic Neg liver ROS, PUD,GERD  ,,  Endo/Other  diabetes, Type 2    Renal/GU Renal diseaseHistory of CRF but labs look good today  negative genitourinary   Musculoskeletal  (+) Arthritis , Osteoarthritis,    Abdominal  (+) + obese  Peds  Hematology  (+) Blood dyscrasia, anemia   Anesthesia Other Findings   Reproductive/Obstetrics negative OB ROS                             Anesthesia Physical Anesthesia Plan  ASA: 4 and emergent  Anesthesia Plan: General   Post-op Pain Management: Minimal or no pain anticipated   Induction: Intravenous  PONV Risk Score and Plan: Propofol  infusion  Airway Management Planned: Nasal Cannula and Natural Airway  Additional Equipment: None  Intra-op Plan:   Post-operative Plan:   Informed Consent: I have reviewed the patients History and Physical, chart, labs and discussed the procedure including the risks, benefits and alternatives for the proposed anesthesia with the patient or authorized representative who has indicated his/her understanding and acceptance.     Dental  Advisory Given  Plan Discussed with: CRNA  Anesthesia Plan Comments:         Anesthesia Quick Evaluation

## 2023-09-28 NOTE — Brief Op Note (Signed)
 09/28/2023  4:02 PM  PATIENT:  Gregory Alvarado  73 y.o. male  PRE-OPERATIVE DIAGNOSIS:  melena, acute on chronic anemia  POST-OPERATIVE DIAGNOSIS:  arterio-venous malformations-gastric times three, small bowel times eight  PROCEDURE:  Procedure(s): ENTEROSCOPY (N/A) HOT HEMOSTASIS (ARGON PLASMA COAGULATION/BICAP)  SURGEON:  Surgeons and Role:    * Eartha Angelia Sieving, MD - Primary  Patient underwent enteroscopy under propofol  sedation.  Tolerated the procedure adequately.   FINDINGS: - Normal esophagus.  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  Treated with argon plasma coagulation (APC).  - Normal examined duodenum.  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  Treated with argon plasma coagulation (APC).   RECOMMENDATIONS - Return patient to hospital [name] for ongoing care.  - Resume previous diet.  - Use Protonix  (pantoprazole ) 40 mg PO BID.  - Start octreotide  100 mg [name] BID. If stable hemoglobin with this approach, may consider switching him to LAR octreotide  monthly.  Sieving Eartha, MD Gastroenterology and Hepatology [facility] Gastroenter[facility]

## 2023-09-28 NOTE — Progress Notes (Signed)
 Subjective: Reports reparatory status is back to baseline.   States he has had 3 years of black stools. No brbpr. No BM since presenting to the ER. Reports chronic constipation. Will have 1 BM per week. Reports he has tried MiraLAX  in the past but it has never worked. No abdominal pain, nausea, or vomiting.   No NSAIDs.    Objective: Vital signs in last 24 hours: Temp:  [98.3 F (36.8 C)-98.7 F (37.1 C)] 98.3 F (36.8 C) (01/14 0343) Pulse Rate:  [88-98] 96 (01/14 0825) Resp:  [17-20] 20 (01/14 0343) BP: (114-130)/(51-69) 114/66 (01/14 0825) SpO2:  [94 %-97 %] 95 % (01/14 0343) Weight:  [123.7 kg] 123.7 kg (01/14 0352) Last BM Date : 09/24/23 General:   Alert and oriented, pleasant.   Head:  Normocephalic and atraumatic. Eyes:  No icterus, sclera clear. Conjuctiva pink.  Lungs: Clear to auscultation bilaterally, wheezing present, few rhonchi on the right.   Abdomen:  Bowel sounds present, soft, non-distended. Mild generalized TTP. No rebound or guarding. No masses appreciated.  Msk:  Symmetrical without gross deformities. Normal posture. Extremities:  With 1+ pitting edema in the LLQ, little to no edema in the RLE. Per patient, this is chronic.  Neurologic:  Alert and  oriented x4;  grossly normal neurologically. Skin:  Warm and dry, intact without significant lesions.  Psych:  Normal mood and affect.   Intake/Output from previous day: 01/13 0701 - 01/14 0700 In: 1440 [P.O.:1440] Out: 2525 [Urine:2525] Intake/Output this shift: No intake/output data recorded.  Lab Results: Recent Labs    09/26/23 0622 09/27/23 0304 09/28/23 0438  WBC 11.0* 10.6* 10.8*  HGB 8.0* 7.9* 7.6*  HCT 30.1* 29.4* 29.6*  PLT 150 150 159   BMET Recent Labs    09/26/23 0622 09/27/23 0304 09/28/23 0438  NA 136 136 135  K 3.4* 2.9* 3.2*  CL 96* 93* 92*  CO2 33* 34* 35*  GLUCOSE 143* 122* 143*  BUN 14 12 15   CREATININE 1.06 1.15 1.19  CALCIUM  9.1 9.1 9.0   LFT Recent Labs     09/25/23 2136 09/26/23 0622 09/28/23 0438  PROT 6.7 7.0  --   ALBUMIN 3.2* 3.4* 3.2*  AST 16 14*  --   ALT 12 10  --   ALKPHOS 59 63  --   BILITOT 0.5 1.0  --     Studies/Results: ECHOCARDIOGRAM COMPLETE Result Date: 09/26/2023    ECHOCARDIOGRAM REPORT   [name] Fifield Date of Exam: 09/26/2023 Medical Rec #:  [id number]     Height:       72.0 in Accession #:    [id number]    Weight:       286.6 lb Date of Birth:  February 23, 1951     BSA:          2.483 m Patient Age:    72 years      BP:           157/74 mmHg Patient Gender: M             HR:           94 bpm. Exam Location:  [facility] Procedure: 2D Echo, Cardiac Doppler and Color Doppler Indications:    CHF-Acute Diastolic I50.31  History:        Patient has prior history of Echocardiogram examinations, most                 recent 10/22/2022. CHF, CAD,  COPD, Signs/Symptoms:Syncope; Risk                 Factors:Hypertension, Diabetes and Dyslipidemia. Tobacco use                 (From Hx), Acute on chronic combined systolic and diastolic CHF                 (congestive heart failure) (HCC).  Sonographer:    Aida Pizza RCS Referring Phys: [id number] OLADAPO ADEFESO  Sonographer Comments: Technically difficult study due to poor echo windows. Patient has no I.V. at this time. IMPRESSIONS  1. Left ventricular ejection fraction, by estimation, is 50 to 55%. The left ventricle has low normal function. The left ventricle demonstrates regional wall motion abnormalities (see scoring diagram/findings for description). There is mild concentric left ventricular hypertrophy. Left ventricular diastolic parameters are indeterminate. Elevated left ventricular end-diastolic pressure.  2. Right ventricular systolic function is normal. The right ventricular size is normal. Tricuspid regurgitation signal is inadequate for assessing PA pressure.  3. Left atrial size was severely dilated.  4. Right atrial size was mildly dilated.  5. Moderate pericardial effusion.  The pericardial effusion is lateral to the left ventricle. There is no evidence of cardiac tamponade.  6. The mitral valve is normal in structure. No evidence of mitral valve regurgitation. No evidence of mitral stenosis.  7. The aortic valve is normal in structure. Aortic valve regurgitation is not visualized. No aortic stenosis is present.  8. The inferior vena cava is dilated in size with >50% respiratory variability, suggesting right atrial pressure of 8 mmHg. FINDINGS  Left Ventricle: Left ventricular ejection fraction, by estimation, is 50 to 55%. The left ventricle has low normal function. The left ventricle demonstrates regional wall motion abnormalities. The left ventricular internal cavity size was normal in size. There is mild concentric left ventricular hypertrophy. Left ventricular diastolic parameters are indeterminate. Elevated left ventricular end-diastolic pressure.  LV Wall Scoring: The apical septal segment, apical inferior segment, and apex are akinetic. The entire anterior wall, entire lateral wall, anterior septum, inferior wall, mid inferoseptal segment, and basal inferoseptal segment are hypokinetic. Right Ventricle: The right ventricular size is normal. No increase in right ventricular wall thickness. Right ventricular systolic function is normal. Tricuspid regurgitation signal is inadequate for assessing PA pressure. Left Atrium: Left atrial size was severely dilated. Right Atrium: Right atrial size was mildly dilated. Pericardium: A moderately sized pericardial effusion is present. The pericardial effusion is lateral to the left ventricle. There is no evidence of cardiac tamponade. Presence of epicardial fat layer. Mitral Valve: The mitral valve is normal in structure. No evidence of mitral valve regurgitation. No evidence of mitral valve stenosis. Tricuspid Valve: The tricuspid valve is normal in structure. Tricuspid valve regurgitation is not demonstrated. No evidence of tricuspid  stenosis. Aortic Valve: The aortic valve is normal in structure. Aortic valve regurgitation is not visualized. No aortic stenosis is present. Pulmonic Valve: The pulmonic valve was normal in structure. Pulmonic valve regurgitation is not visualized. No evidence of pulmonic stenosis. Aorta: The aortic root is normal in size and structure. Venous: The inferior vena cava is dilated in size with greater than 50% respiratory variability, suggesting right atrial pressure of 8 mmHg. IAS/Shunts: No atrial level shunt detected by color flow Doppler.  LEFT VENTRICLE PLAX 2D LVIDd:         4.80 cm   Diastology LVIDs:         3.30 cm   LV e'  medial:    6.31 cm/s LV PW:         1.30 cm   LV E/e' medial:  19.8 LV IVS:        1.20 cm   LV e' lateral:   6.85 cm/s LVOT diam:     2.00 cm   LV E/e' lateral: 18.2 LV SV:         71 LV SV Index:   28 LVOT Area:     3.14 cm  RIGHT VENTRICLE RV S prime:     12.30 cm/s TAPSE (M-mode): 2.5 cm LEFT ATRIUM              Index        RIGHT ATRIUM           Index LA diam:        5.20 cm  2.09 cm/m   RA Area:     23.50 cm LA Vol (A2C):   127.0 ml 51.16 ml/m  RA Volume:   69.00 ml  27.79 ml/m LA Vol (A4C):   116.0 ml 46.73 ml/m LA Biplane Vol: 129.0 ml 51.96 ml/m  AORTIC VALVE LVOT Vmax:   112.00 cm/s LVOT Vmean:  70.700 cm/s LVOT VTI:    0.225 m  AORTA Ao Root diam: 3.80 cm MITRAL VALVE MV Area (PHT): 2.66 cm     SHUNTS MV Decel Time: 285 msec     Systemic VTI:  0.22 m MV E velocity: 125.00 cm/s  Systemic Diam: 2.00 cm MV A velocity: 141.00 cm/s MV E/A ratio:  0.89 Kardie Tobb DO Electronically signed by Dub Huntsman DO Signature Date/Time: 09/26/2023/2:27:45 PM    Final     Assessment: 73 y.o. male with a history of chronic systolic and diastolic CHF, CAD with DES (12/21), type 2 diabetes mellitus, hyperlipidemia, GI bleed, COPD, chronic respiratory failure on supplemental oxygen  at 2-3 liters as outpatient, recurrent AVMs, presenting with worsening chest pain, DOE, and admitted with  acute on chronic CHF. GI consulted due to chronic melena and worsening anemia.   Acute on chronic anemia:  Hgb 6.2 on admission, s/p 2 units of PRBCs with Hgb improved to 8.0 1/12, back down to 7.6 this morning, but no overt GI bleeding since presenting to the ER. Denies NSAIDs. Last EGD March 2023 with gastric AVM, duodenal and jejunal AVMs s/p APC. Colonoscopy 2021 with inadequate prep and two polyps. Suspect he is likely oozing from AVMs.   From a CHF standpoint, patient seems to be close to baseline. Cardiology has cleared patient to proceed with procedure today.   Chronic constipation:  Needs to start daily bowel regimen. Would be ok to re-try MiraLAX  daily, but may need prescriptive agent as patient reports failing MiraLAX  previously.   Plan: Keep NPO Continue IV PPI BID Proceed with EGD/push enteroscopy with Dr. Eartha today. The risks, benefits, and alternatives have been discussed with the patient in detail. The patient states understanding and desires to proceed.  OK to start MiraLAX  daily for constipation, but may need prescriptive agent if not helpful.    LOS: 3 days    09/28/2023, 8:53 AM   Josette Centers, PA-C [facility]

## 2023-09-28 NOTE — Progress Notes (Signed)
 PROGRESS NOTE  Gregory Alvarado, is a 73 y.o. male, DOB - 12-26-1950, FMW:[id number]  Admit date - 09/25/2023   Admitting Physician Posey Maier, DO  Outpatient Primary MD for the patient is Shona Norleen PEDLAR, MD  LOS - 3  Chief Complaint  Patient presents with   Chest Pain      Brief Narrative:  -73 y.o. male  with medical history significant of chronic systolic and diastolic CHF, CAD with DES (12/21), type 2 diabetes mellitus, hyperlipidemia, GI bleed, COPD, chronic respiratory failure on supplemental oxygen  at 3 - 4 LPM of oxygen , tobacco abuse  and recurrent AVMs admitted on 09/25/2023 with acute on chronic combined systolic and diastolic dysfunction CHF and concerns about possible ongoing GI bleed with acute on chronic symptomatic anemia    -Assessment and Plan: 1) acute on chronic combined diastolic and systolic dysfunction CHF/ICM Echo from 10/22/2022 showed a recovery of the heart with LVEF of 50 to 55% (from 40 to 45% on 08/28/2021). Apex is akinetic. Positive RWMA. Severe LVH. G1 DD.  -Repeat echo from 09/26/2023 with EF of 50 to 55% with wall motion abnormalities (The apical septal segment, apical inferior segment, and apex are akinetic. The entire anterior wall, entire lateral wall, anterior septum, inferior wall, mid inferoseptal segment, and basal inferoseptal segment are hypokinetic.) -- Continue IV Lasix  and metoprolol  -Hold Jardiance  -Daily weight, I's and O's and Reds vest   cardiology consult appreciated  2) acute on chronic symptomatic anemia-- -hemoglobin is up to 7.6 from 6.2 after transfusion of 2 units of PRBC on 09/25/2023 -Baseline hemoglobin usually between 8 and 9 -EGD from 2020 with AVMs in the stomach and duodenum as well as jejunum treated with APC at that time -GI consult appreciated -Cardiology consult for preop clearance appreciated-- -EGD 09/28/2023 shows--Normal esophagus.  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 Treated with argon plasma  coagulation (APC). - Normal examined duodenum.  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 Treated with argon plasma coagulation (APC).  --- Okay to change Protonix  to 40 twice daily by mouth -- Start octreotide  100 mg [place] BID. If stable hemoglobin with this approach, may consider switching him to LAR octreotide  monthly   3) CAD/ICM ---s/p  DES to prox/mid LAD, DES to prox LCX, DES to OM2. In December 2021 -Avoid aspirin  due to GI bleed concerns -c/n Metoprolol  and Crestor    4)COPD--had increasing wheezing after endoscopy procedure improving with bronchodilators - continue with bronchodilators  5) acute on chronic hypoxic respiratory failure--multifactorial due to CHF and COPD -Currently requiring 3 to 4 L of oxygen  via nasal cannula--oxygen  requirement worsened on 09/28/2023 after endoscopy procedure -At baseline uses 2 L  6)Tobacco Abuse--- smokes 2 packs/day Smoke cessation advised    7)Class 2 Obesity- -Low calorie diet, portion control and increase physical activity discussed with patient -Body mass index is 36.99 kg/m.  8) chronic leukocytosis----review of record reveals mild chronic leukocytosis ??  Etiology -- Consider outpatient hematology follow-up postdischarge  9)DM2--- A1C 5.8 --reflecting excellent diabetic control PTA Hold Jardiance  Use Novolog /Humalog Sliding scale insulin  with Accu-Cheks/Fingersticks as ordered  10)Moderate pericardial effusion--- no tamponade  -cardiology recommends repeat limited echo  11)Hypokalemia--- replace and recheck  Status is: Inpatient   Disposition: The patient is from: Home              Anticipated d/c is to: Home              Anticipated d/c date is: 1 day  Patient currently is not medically stable to d/c. Barriers: Not Clinically Stable-   Code Status :  -  Code Status: Full Code   Family Communication:    NA (patient is alert, awake and coherent)   DVT Prophylaxis  :   - SCDs  SCDs Start:  09/26/23 0308   Lab Results  Component Value Date   PLT 159 09/28/2023   Inpatient Medications  Scheduled Meds:  feeding supplement  237 mL Oral BID BM   furosemide   40 mg Intravenous Q12H   insulin  aspart  0-15 Units Subcutaneous Q4H   ipratropium-albuterol   3 mL Nebulization TID   metoprolol  succinate  25 mg Oral BID   nitroGLYCERIN   0.4 mg Sublingual Once   nystatin    Topical BID   octreotide   100 mcg Subcutaneous Q12H   pantoprazole  (PROTONIX ) IV  40 mg Intravenous Q12H   polyethylene glycol  17 g Oral Daily   rosuvastatin   40 mg Oral Daily   senna-docusate  2 tablet Oral QHS   Continuous Infusions: PRN Meds:.acetaminophen  **OR** acetaminophen , diphenhydrAMINE , ipratropium-albuterol , prochlorperazine , traMADol    Anti-infectives (From admission, onward)    None      Subjective: Gregory Alvarado today has no fevers, no emesis,  No chest pain,   - -Increased congestion and wheezing after endoscopy procedure Improved with bronchodilators  Objective: Vitals:   09/28/23 1607 09/28/23 1615 09/28/23 1630 09/28/23 1744  BP:  (!) 132/51 131/63   Pulse: (!) 107 (!) 103 (!) 103   Resp: (!) 24 (!) 26 (!) 22   Temp:      TempSrc:      SpO2: (!) 85% 94% 98% 90%  Weight:      Height:        Intake/Output Summary (Last 24 hours) at 09/28/2023 2009 Last data filed at 09/28/2023 1556 Gross per 24 hour  Intake 600 ml  Output 2775 ml  Net -2175 ml   Filed Weights   09/27/23 0336 09/28/23 0044 09/28/23 0352  Weight: 123.8 kg 123.7 kg 123.7 kg    Physical Exam Gen:- Awake Alert,  in no apparent distress  HEENT:- [place].AT, No sclera icterus Nose-  3L/min Neck-Supple Neck,No JVD,.  Lungs-no wheezing, fair symmetrical air movement CV- S1, S2 normal, regular  Abd-  +ve B.Sounds, Abd Soft, No tenderness,    Extremity/Skin:- +ve   edema, pedal pulses present  Psych-affect is appropriate, oriented x3 Neuro-generalized weakness, no new focal deficits, no tremors  Data  Reviewed: I have personally reviewed following labs and imaging studies  CBC: Recent Labs  Lab 09/25/23 2136 09/26/23 0622 09/27/23 0304 09/28/23 0438  WBC 12.2* 11.0* 10.6* 10.8*  NEUTROABS 10.2*  --   --   --   HGB 6.2* 8.0* 7.9* 7.6*  HCT 24.7* 30.1* 29.4* 29.6*  MCV 80.7 80.3 79.7* 80.2  PLT 158 150 150 159   Basic Metabolic Panel: Recent Labs  Lab 09/25/23 2136 09/26/23 0622 09/27/23 0304 09/28/23 0438  NA 133* 136 136 135  K 3.8 3.4* 2.9* 3.2*  CL 98 96* 93* 92*  CO2 31 33* 34* 35*  GLUCOSE 123* 143* 122* 143*  BUN 14 14 12 15   CREATININE 0.98 1.06 1.15 1.19  CALCIUM  9.0 9.1 9.1 9.0  MG  --  2.0  --   --   PHOS  --  2.2*  --  2.6   GFR: Estimated Creatinine Clearance: 78.4 mL/min (by C-G formula based on SCr of 1.19 mg/dL). Liver Function Tests: Recent Labs  Lab 09/25/23 2136 09/26/23 0622 09/28/23 0438  AST 16 14*  --   ALT 12 10  --   ALKPHOS 59 63  --   BILITOT 0.5 1.0  --   PROT 6.7 7.0  --   ALBUMIN 3.2* 3.4* 3.2*   HbA1C: Recent Labs    09/26/23 0622  HGBA1C 5.8*   Radiology Studies: No results found.  Scheduled Meds:  feeding supplement  237 mL Oral BID BM   furosemide   40 mg Intravenous Q12H   insulin  aspart  0-15 Units Subcutaneous Q4H   ipratropium-albuterol   3 mL Nebulization TID   metoprolol  succinate  25 mg Oral BID   nitroGLYCERIN   0.4 mg Sublingual Once   nystatin    Topical BID   octreotide   100 mcg Subcutaneous Q12H   pantoprazole  (PROTONIX ) IV  40 mg Intravenous Q12H   polyethylene glycol  17 g Oral Daily   rosuvastatin   40 mg Oral Daily   senna-docusate  2 tablet Oral QHS   Continuous Infusions:   LOS: 3 days   Gregory Alvarado M.D on 09/28/2023 at 8:09 PM  Go to [email or web address] - for contact info  [facility] - Office  [phone]  If 7PM-7AM, please contact night-coverage [email or web address] 09/28/2023, 8:09 PM

## 2023-09-28 NOTE — Op Note (Signed)
 [facility] Patient Name: Gregory Alvarado Procedure Date: 09/28/2023 3:05 PM MRN: [id number] Date of Birth: 09-12-1951 Attending MD: Toribio Fortune , , [id number] CSN: [id number] Age: 73 Admit Type: Inpatient Procedure:                Small bowel enteroscopy Indications:              Iron  deficiency anemia, Melena Providers:                Toribio Fortune, Jon LABOR. Gerome RN, RN, Jon Loge Referring MD:              Medicines:                Monitored Anesthesia Care Complications:            No immediate complications. Estimated Blood Loss:     Estimated blood loss: none. Procedure:                Pre-Anesthesia Assessment:                           - Prior to the procedure, a History and Physical                            was performed, and patient medications, allergies                            and sensitivities were reviewed. The patient's                            tolerance of previous anesthesia was reviewed.                           - The risks and benefits of the procedure and the                            sedation options and risks were discussed with the                            patient. All questions were answered and informed                            consent was obtained.                           - ASA Grade Assessment: III - A patient with severe                            systemic disease.                           After obtaining informed consent, the endoscope was                            passed under direct vision. Throughout the  procedure, the patient's blood pressure, pulse, and                            oxygen  saturations were monitored continuously. The                            [id number]) scope was introduced through                            the mouth and advanced to the proximal jejunum. The                            small bowel enteroscopy was accomplished without                             difficulty. The patient tolerated the procedure                            well. Scope In: 3:26:04 PM Scope Out: 3:53:37 PM Total Procedure Duration: 0 hours 27 minutes 33 seconds  Findings:      The esophagus was normal.      Three 2 to 4 mm angiodysplastic lesions with no bleeding were found in       the gastric body. Coagulation for bleeding prevention using argon plasma       at 0.3 liters/minute and 20 watts was successful.      The examined duodenum was normal.      Eight angiodysplastic lesions with no bleeding were found in the       proximal jejunum. Coagulation for bleeding prevention using argon plasma       at 0.3 liters/minute and 20 watts was successful. Impression:               - Normal esophagus.                           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 Treated with argon plasma coagulation                            (APC).                           - Normal examined duodenum.                           - Eight non-bleeding angiodysplastic lesions in the                            duodenum. Treated with argon plasma coagulation                            (APC).                           - No specimens collected. Moderate  Sedation:      Per Anesthesia Care Recommendation:           - Return patient to hospital [facility] for ongoing care.                           - Resume previous diet.                           - Use Protonix  (pantoprazole ) 40 mg PO BID.                           - Start octreotide  100 mg [place] BID. If stable                            hemoglobin with this approach, may consider                            switching him to LAR octreotide  monthly. Procedure Code(s):        --- Professional ---                           [phone], Small intestinal endoscopy, enteroscopy                            beyond second portion of duodenum, not including                            ileum; with  control of bleeding (eg, injection,                            bipolar cautery, unipolar cautery, laser, heater                            probe, stapler, plasma coagulator) Diagnosis Code(s):        --- Professional ---                           K31.819, Angiodysplasia of stomach and duodenum                            without bleeding                           D50.9, Iron  deficiency anemia, unspecified                           K92.1, Melena (includes Hematochezia) CPT copyright 2022 [employer]. All rights reserved. The codes documented in this report are preliminary and upon coder review may  be revised to meet current compliance requirements. Toribio Fortune, MD Toribio Fortune,  09/28/2023 4:03:09 PM This report has been signed electronically. Number of Addenda: 0

## 2023-09-28 NOTE — Progress Notes (Addendum)
 Patient Name: Kreston Ahrendt Studer Date of Encounter: 09/28/2023 [facility] Cardiologist: Alvan Carrier, MD   Interval Summary  .    Reports his breathing has improved (has baseline dyspnea in the setting of COPD and on chronic supplemental oxygen ). No chest pain or palpitations. Has been NPO for possible GI procedures today.   Vital Signs .    Vitals:   09/28/23 0044 09/28/23 0343 09/28/23 0352 09/28/23 0825  BP:  (!) 124/51  114/66  Pulse:  88  96  Resp:  20    Temp:  98.3 F (36.8 C)    TempSrc:  Oral    SpO2:  95%    Weight: 123.7 kg  123.7 kg   Height:        Intake/Output Summary (Last 24 hours) at 09/28/2023 1022 Last data filed at 09/28/2023 0351 Gross per 24 hour  Intake 960 ml  Output 2125 ml  Net -1165 ml      09/28/2023    3:52 AM 09/28/2023   12:44 AM 09/27/2023    3:36 AM  Last 3 Weights  Weight (lbs) 272 lb 11.3 oz 272 lb 11.3 oz 272 lb 14.9 oz  Weight (kg) 123.7 kg 123.7 kg 123.8 kg      Telemetry/ECG/Cardiac Studies    NSR, HR in 80's to 90's. Occasional PVC's. - Personally Reviewed  Echocardiogram: 09/26/2023 IMPRESSIONS    1. Left ventricular ejection fraction, by estimation, is 50 to 55%. The  left ventricle has low normal function. The left ventricle demonstrates  regional wall motion abnormalities (see scoring diagram/findings for  description). There is mild concentric  left ventricular hypertrophy. Left ventricular diastolic parameters are  indeterminate. Elevated left ventricular end-diastolic pressure.   2. Right ventricular systolic function is normal. The right ventricular  size is normal. Tricuspid regurgitation signal is inadequate for assessing  PA pressure.   3. Left atrial size was severely dilated.   4. Right atrial size was mildly dilated.   5. Moderate pericardial effusion. The pericardial effusion is lateral to  the left ventricle. There is no evidence of cardiac tamponade.   6. The mitral valve is normal in  structure. No evidence of mitral valve  regurgitation. No evidence of mitral stenosis.   7. The aortic valve is normal in structure. Aortic valve regurgitation is  not visualized. No aortic stenosis is present.   8. The inferior vena cava is dilated in size with >50% respiratory  variability, suggesting right atrial pressure of 8 mmHg.    Physical Exam .    GEN: Appears in no acute distress.   Neck: No JVD Cardiac: RRR, no murmurs, rubs, or gallops.  Respiratory: Scattered rhonchi. No rales or wheezing.  GI: Soft, nontender, non-distended  MS: 1+ pitting edema along LLE, trace edema along RLE.   Assessment & Plan .     1. 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 BNP elevated at 637 on admission. He has been receiving IV Lasix  40mg  BID with a recorded net output of -8.5 L thus far and weight has declined from 286 lbs to 272 lbs. ReDS Vest at 32 yesterday. Creatinine stable at 1.19. K+ low at 3.2 and will order replacement. Keep K+ ~ 4.0. Will also check Mg.  - Can continue with IV Lasix  40mg  BID given persistent lower extremity edema and stable renal function. Continue Toprol -XL and plan to restart Jardiance  prior to  discharge (currently held for procedures).   2. Pericardial Effusion - Moderate by echocardiogram this admission with no evidence of tamponade. Would anticipate a repeat limited echo tomorrow or Thursday for reassessment.   3. CAD - He is s/p NSTEMI in 2021 with DES to proximal-LAD, DES to LCx and DES to OM2. Initial and repeat Hs Troponin values have been negative at 16 and 17 this admission. He denies any current pain.  - Continue Crestor  40mg  daily and Toprol -XL 25mg  BID. ASA held given anemia.   4. COPD - He is on 2L [place] at baseline.   5. Anemia - Hgb was at 6.2 upon admission and he received 2 units pRBC's. FOBT positive. GI following and planning for push enteroscopy today pending cardiac  assessment. Will review with Dr. Debera but given improvement in his respiratory status, would not anticipate further cardiac testing prior to his procedure. Would replace K+ as discussed above.    For questions or updates, please contact [facility] Please consult [email or web address] for contact info under     Signed, Laymon CHRISTELLA Qua, PA-C    Attending note:  Patient seen and examined.  I reviewed the chart and discussed the case with the Strader PA-C, agree with her above findings.  Mr. Longley reports overall improvement in shortness of breath, peripheral edema continues to improve.  Follow-up ReDS vest measurement was 32 yesterday.  Patient scheduled for push enteroscopy today per GI.  He is NPO.  He is afebrile, heart rate in the 90-100 range in sinus rhythm on, blood pressure 114/66.  Lungs exhibit decreased breath sounds with scattered rhonchi, mild peripheral edema which is overall improved.  Cardiac exam with RRR and no gallop.  Pertinent lab work includes potassium 3.2, BUN 15, creatinine 1.19, hemoglobin 7.6, platelets 159.  Stable to proceed with GI procedure from a cardiac perspective.  Plan to continue Toprol -XL and IV Lasix  with potassium supplement.  He is also on Crestor , antiplatelet agents are held.  We will likely cut back on diuretics starting tomorrow.  He can be transition back to Jardiance  prior to discharge.  Jayson JUDITHANN Debera, M.D., F.A.C.C.

## 2023-09-28 NOTE — Transfer of Care (Signed)
 Immediate Anesthesia Transfer of Care Note  Patient: Gregory Alvarado  Procedure(s) Performed: ENTEROSCOPY HOT HEMOSTASIS (ARGON PLASMA COAGULATION/BICAP)  Patient Location: PACU  Anesthesia Type:General  Level of Consciousness: drowsy and patient cooperative  Airway & Oxygen  Therapy: Patient Spontanous Breathing and non-rebreather face mask  Post-op Assessment: Report given to RN and Post -op Vital signs reviewed and stable  Post vital signs: Reviewed and stable  Last Vitals:  Vitals Value Taken Time  BP 132/50 09/28/23 1603  Temp 36.4 C 09/28/23 1603  Pulse 107 09/28/23 1604  Resp 25 09/28/23 1606  SpO2 94 % 09/28/23 1604  Vitals shown include unfiled device data.  Last Pain:  Vitals:   09/28/23 1519  TempSrc:   PainSc: 0-No pain         Complications: No notable events documented.

## 2023-09-29 ENCOUNTER — Telehealth (INDEPENDENT_AMBULATORY_CARE_PROVIDER_SITE_OTHER): Payer: Self-pay | Admitting: Gastroenterology

## 2023-09-29 ENCOUNTER — Other Ambulatory Visit (HOSPITAL_COMMUNITY): Payer: Self-pay | Admitting: *Deleted

## 2023-09-29 ENCOUNTER — Other Ambulatory Visit: Payer: Self-pay | Admitting: *Deleted

## 2023-09-29 ENCOUNTER — Inpatient Hospital Stay (HOSPITAL_COMMUNITY): Payer: PPO

## 2023-09-29 DIAGNOSIS — E1165 Type 2 diabetes mellitus with hyperglycemia: Secondary | ICD-10-CM | POA: Diagnosis not present

## 2023-09-29 DIAGNOSIS — G8929 Other chronic pain: Secondary | ICD-10-CM | POA: Diagnosis not present

## 2023-09-29 DIAGNOSIS — K5520 Angiodysplasia of colon without hemorrhage: Secondary | ICD-10-CM

## 2023-09-29 DIAGNOSIS — I3139 Other pericardial effusion (noninflammatory): Secondary | ICD-10-CM | POA: Diagnosis not present

## 2023-09-29 DIAGNOSIS — E782 Mixed hyperlipidemia: Secondary | ICD-10-CM | POA: Diagnosis not present

## 2023-09-29 DIAGNOSIS — K921 Melena: Secondary | ICD-10-CM

## 2023-09-29 DIAGNOSIS — D649 Anemia, unspecified: Secondary | ICD-10-CM | POA: Diagnosis not present

## 2023-09-29 DIAGNOSIS — Z8719 Personal history of other diseases of the digestive system: Secondary | ICD-10-CM | POA: Diagnosis not present

## 2023-09-29 DIAGNOSIS — I5043 Acute on chronic combined systolic (congestive) and diastolic (congestive) heart failure: Secondary | ICD-10-CM | POA: Diagnosis not present

## 2023-09-29 DIAGNOSIS — D638 Anemia in other chronic diseases classified elsewhere: Secondary | ICD-10-CM

## 2023-09-29 DIAGNOSIS — K219 Gastro-esophageal reflux disease without esophagitis: Secondary | ICD-10-CM | POA: Diagnosis not present

## 2023-09-29 LAB — GLUCOSE, CAPILLARY
Glucose-Capillary: 151 mg/dL — ABNORMAL HIGH (ref 70–99)
Glucose-Capillary: 159 mg/dL — ABNORMAL HIGH (ref 70–99)
Glucose-Capillary: 205 mg/dL — ABNORMAL HIGH (ref 70–99)
Glucose-Capillary: 189 mg/dL — ABNORMAL HIGH (ref 70–99)
Glucose-Capillary: 234 mg/dL — ABNORMAL HIGH (ref 70–99)
Glucose-Capillary: 212 mg/dL — ABNORMAL HIGH (ref 70–99)

## 2023-09-29 LAB — CBC
HCT: 29.3 % — ABNORMAL LOW (ref 39.0–52.0)
Hemoglobin: 7.7 g/dL — ABNORMAL LOW (ref 13.0–17.0)
MCH: 20.9 pg — ABNORMAL LOW (ref 26.0–34.0)
MCHC: 26.3 g/dL — ABNORMAL LOW (ref 30.0–36.0)
MCV: 79.4 fL — ABNORMAL LOW (ref 80.0–100.0)
Platelets: 175 10*3/uL (ref 150–400)
RBC: 3.69 MIL/uL — ABNORMAL LOW (ref 4.22–5.81)
RDW: 19.3 % — ABNORMAL HIGH (ref 11.5–15.5)
WBC: 11 10*3/uL — ABNORMAL HIGH (ref 4.0–10.5)
nRBC: 0 % (ref 0.0–0.2)

## 2023-09-29 LAB — ECHOCARDIOGRAM LIMITED
Est EF: 55
Height: 72 in
S' Lateral: 3.7 cm
Single Plane A4C EF: 50.7 %
Weight: 4342.18 [oz_av]

## 2023-09-29 LAB — BASIC METABOLIC PANEL
Anion gap: 9 (ref 5–15)
BUN: 19 mg/dL (ref 8–23)
CO2: 34 mmol/L — ABNORMAL HIGH (ref 22–32)
Calcium: 9.1 mg/dL (ref 8.9–10.3)
Chloride: 91 mmol/L — ABNORMAL LOW (ref 98–111)
Creatinine, Ser: 1.25 mg/dL — ABNORMAL HIGH (ref 0.61–1.24)
GFR, Estimated: >60 mL/min (ref >60)
Glucose, Bld: 158 mg/dL — ABNORMAL HIGH (ref 70–99)
Potassium: 4 mmol/L (ref 3.5–5.1)
Sodium: 134 mmol/L — ABNORMAL LOW (ref 135–145)

## 2023-09-29 LAB — MAGNESIUM: Magnesium: 2 mg/dL (ref 1.7–2.4)

## 2023-09-29 MED ORDER — SPIRONOLACTONE 12.5 MG HALF TABLET
12.5000 mg | ORAL_TABLET | Freq: Every day | ORAL | Status: DC
  Administered 2023-09-29 – 2023-09-30 (×2): 12.5 mg via ORAL
  Filled 2023-09-29 (×2): qty 1

## 2023-09-29 NOTE — Progress Notes (Signed)
   09/29/23 1100  ReDS Vest / Clip  Ruler Value 40  ReDS Value Range 36 - 40  ReDS Actual Value 27   Pt sitting upright in chair

## 2023-09-29 NOTE — Progress Notes (Addendum)
PROGRESS NOTE  Gregory Alvarado, is a 73 y.o. male, DOB - 1951/05/26, WUJ:[id number]  Admit date - 09/25/2023   Admitting Physician Frankey Shown, DO  Outpatient Primary MD for the patient is Benita Stabile, MD  LOS - 4  Chief Complaint  Patient presents with   Chest Pain      Brief Narrative:  -73 y.o. male  with medical history significant of chronic systolic and diastolic CHF, CAD with DES (12/21), type 2 diabetes mellitus, hyperlipidemia, GI bleed, COPD, chronic respiratory failure on supplemental oxygen at 3 - 4 LPM of oxygen, tobacco abuse  and recurrent AVMs admitted on 09/25/2023 with acute on chronic combined systolic and diastolic dysfunction CHF and concerns about possible ongoing GI bleed with acute on chronic symptomatic anemia    -Assessment and Plan: 1) acute on chronic combined diastolic and systolic dysfunction CHF/ICM Echo from 10/22/2022 showed a recovery of the heart with LVEF of 50 to 55% (from 40 to 45% on 08/28/2021). Apex is akinetic. Positive RWMA. Severe LVH. G1 DD.  -Repeat echo from 09/26/2023 with EF of 50 to 55% with wall motion abnormalities (The apical septal segment, apical inferior segment, and apex are akinetic. The entire anterior wall, entire lateral wall, anterior septum, inferior wall, mid inferoseptal segment, and basal inferoseptal segment are hypokinetic.) -Continue beta-blocker -Appreciate assistance and recommendation by cardiology service; diuretics transition to oral route using spironolactone. -Hold Jardiance -Daily weight, I's and O's and Reds vest   cardiology consult appreciated -Low-sodium diet discussed with patient.  2) acute on chronic symptomatic blood loss anemia-- -hemoglobin is up to 7.6 from 6.2 after transfusion of 2 units of PRBC on 09/25/2023 -Baseline hemoglobin usually between 8 and 9 -EGD from 2020 with AVMs in the stomach and duodenum as well as jejunum treated with APC at that time -GI consult appreciated -Cardiology consult  for preop clearance appreciated-- -EGD 09/28/2023 shows--Normal esophagus.  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 Treated with argon plasma coagulation (APC). - Normal examined duodenum.  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 Treated with argon plasma coagulation (APC).  --- Okay to change Protonix to 40 twice daily by mouth -- Started octreotide 100 mg  BID. If stable hemoglobin with this approach, may consider switching him to LAR octreotide to be pursued as an outpatient. -Follow hemoglobin trend with repeat CBC in AM.  3) CAD/ICM ---s/p  DES to prox/mid LAD, DES to prox LCX, DES to OM2. In December 2021 -Avoidstress ing aspirin due to GI bleed concerns -c/n Metoprolol and Crestor  -No chest pain.  4)COPD--had increasing wheezing after endoscopy procedure improving with bronchodilators - continue with bronchodilators and will recommend outpatient follow-up with pulmonologist.  5) acute on chronic hypoxic respiratory failure--multifactorial due to CHF and COPD -After receiving treatment with bronchodilator management and diuresis patient's respiratory status has stabilized and back to the use of 2 L supplementation. -Continue clinical response.  6)Tobacco Abuse--- smokes 2 packs/day -Smoking cessation counseling provided.  7)Class 2 Obesity- -Low calorie diet, portion control and increase physical activity discussed with patient. -Body mass index is 36.81 kg/m.  8) chronic leukocytosis----review of record reveals mild chronic leukocytosis ??  Etiology -- Consider outpatient hematology follow-up postdischarge. -No fever or complaints suggesting infection. -Continue monitoring WBCs trend.  9)DM2--- A1C 5.8 --reflecting excellent diabetic control PTA -Continue to hold Jardiance -Continue to follow CBG fluctuation and adjust medication as needed -Modified carbohydrate diet discussed with patient.  10)Moderate pericardial  effusion--- no tamponade  -Appreciate assistance  and recommendation by cardiology service -Limited echo on September 26, 2023 demonstrating not physiologic tamponade.  11)Hypokalemia- -has been repleted -Continue to follow electrolytes trend.  Status is: Inpatient   Disposition: The patient is from: Home              Anticipated d/c is to: Home              Anticipated d/c date is: 1 day              Patient currently is not medically stable to d/c. Barriers: Not Clinically Stable-   Code Status :  -  Code Status: Full Code   Family Communication:    NA (patient is alert, awake and coherent)   DVT Prophylaxis  :   - SCDs  SCDs Start: 09/26/23 0308   Lab Results  Component Value Date   PLT 175 09/29/2023   Inpatient Medications  Scheduled Meds:  feeding supplement  237 mL Oral BID BM   insulin aspart  0-15 Units Subcutaneous Q4H   ipratropium-albuterol  3 mL Nebulization TID   metoprolol succinate  25 mg Oral BID   nitroGLYCERIN  0.4 mg Sublingual Once   nystatin   Topical BID   octreotide  100 mcg Subcutaneous Q12H   pantoprazole (PROTONIX) IV  40 mg Intravenous Q12H   polyethylene glycol  17 g Oral Daily   rosuvastatin  40 mg Oral Daily   senna-docusate  2 tablet Oral QHS   spironolactone  12.5 mg Oral Daily   Continuous Infusions: PRN Meds:.acetaminophen **OR** acetaminophen, diphenhydrAMINE, ipratropium-albuterol, prochlorperazine, traMADol   Anti-infectives (From admission, onward)    None      Subjective: [name] no overt bleeding reported; no chest pain, no nausea, no vomiting, no shortness of breath.  Patient reports breathing at baseline.  Objective: Vitals:   09/29/23 0836 09/29/23 0849 09/29/23 1318 09/29/23 1436  BP: 107/62  (!) 115/53   Pulse: 96  87   Resp: 20  20   Temp:   98.7 F (37.1 C)   TempSrc:      SpO2: 95% 94% 93% 96%  Weight:      Height:        Intake/Output Summary (Last 24 hours) at 09/29/2023 1655 Last data filed at  09/29/2023 1300 Gross per 24 hour  Intake 1080 ml  Output 1600 ml  Net -520 ml   Filed Weights   09/28/23 0044 09/28/23 0352 09/29/23 0500  Weight: 123.7 kg 123.7 kg 123.1 kg    Physical Exam General exam: Alert, awake, oriented x 3; reporting no chest pain or shortness of breath.  No overt bleeding appreciated overnight. Respiratory system: Good saturation on chronic supplementation.  Patient denies orthopnea. Cardiovascular system: Rate controlled, no rubs, no gallops, no JVD. Gastrointestinal system: Abdomen is obese, nondistended, soft and nontender. No organomegaly or masses felt. Normal bowel sounds heard. Central nervous system: No focal neurological deficits. Extremities: No cyanosis or clubbing.  Chronic trace edema appreciated bilaterally. Skin: No petechiae. Psychiatry: Judgement and insight appear normal. Mood & affect appropriate.   Data Reviewed: I have personally reviewed following labs and imaging studies  CBC: Recent Labs  Lab 09/25/23 2136 09/26/23 0622 09/27/23 0304 09/28/23 0438 09/29/23 0542  WBC 12.2* 11.0* 10.6* 10.8* 11.0*  NEUTROABS 10.2*  --   --   --   --   HGB 6.2* 8.0* 7.9* 7.6* 7.7*  HCT 24.7* 30.1* 29.4* 29.6* 29.3*  MCV 80.7 80.3 79.7* 80.2 79.4*  PLT 158 150 150 159 175   Basic Metabolic Panel: Recent Labs  Lab 09/25/23 2136 09/26/23 0622 09/27/23 0304 09/28/23 0438 09/29/23 0542  NA 133* 136 136 135 134*  K 3.8 3.4* 2.9* 3.2* 4.0  CL 98 96* 93* 92* 91*  CO2 31 33* 34* 35* 34*  GLUCOSE 123* 143* 122* 143* 158*  BUN 14 14 12 15 19   CREATININE 0.98 1.06 1.15 1.19 1.25*  CALCIUM 9.0 9.1 9.1 9.0 9.1  MG  --  2.0  --   --  2.0  PHOS  --  2.2*  --  2.6  --    GFR: Estimated Creatinine Clearance: 74.5 mL/min (A) (by C-G formula based on SCr of 1.25 mg/dL (H)).  Liver Function Tests: Recent Labs  Lab 09/25/23 2136 09/26/23 0622 09/28/23 0438  AST 16 14*  --   ALT 12 10  --   ALKPHOS 59 63  --   BILITOT 0.5 1.0  --   PROT  6.7 7.0  --   ALBUMIN 3.2* 3.4* 3.2*   HbA1C: No results for input(s): "HGBA1C" in the last 72 hours.  Radiology Studies: ECHOCARDIOGRAM LIMITED Result Date: 09/29/2023    ECHOCARDIOGRAM LIMITED REPORT   [name] Date of Exam: 09/29/2023 Medical Rec #:  [id number]     Height:       72.0 in Accession #:    [id number]    Weight:       271.4 lb Date of Birth:  11/30/1950     BSA:          2.426 m Patient Age:    72 years      BP:           107/62 mmHg Patient Gender: M             HR:           80 bpm. [facility] Procedure: Limited Echo, Limited Color Doppler and Cardiac Doppler Indications:    Pericardial effusion  History:        Patient has prior history of Echocardiogram examinations, most                 recent 09/26/2023. CHF, CAD, COPD, Arrythmias:Cardiac Arrest,                 Signs/Symptoms:Syncope; Risk Factors:Hypertension, Diabetes and                 Dyslipidemia.  Sonographer:    Vern Claude Referring Phys: [id number] SAMUEL G MCDOWELL IMPRESSIONS  1. Limited study.  2. Left ventricular ejection fraction, by estimation, is approximately 55%. The left ventricle has normal function. Left ventricular endocardial border not optimally defined to evaluate regional wall motion. There is mild concentric left ventricular hypertrophy.  3. Right ventricular systolic function is normal. The right ventricular size is normal. Tricuspid regurgitation signal is inadequate for assessing PA pressure.  4. Moderate pericardial effusion. The pericardial effusion is posterior to the left ventricle and lateral to the left ventricle. There is no evidence of cardiac tamponade.  5. The aortic valve is tricuspid. Aortic valve regurgitation is not visualized.  6. The inferior vena cava is dilated in size with >50% respiratory variability, suggesting right atrial pressure of 8 mmHg. Comparison(s): Prior images reviewed side by side. No significant change in moderate, posterolateral pericardial  effusion. FINDINGS  Left Ventricle: Left ventricular ejection fraction, by estimation, is 55%. The left ventricle has normal  function. Left ventricular endocardial border not optimally defined to evaluate regional wall motion. The left ventricular internal cavity size was normal in size. There is mild concentric left ventricular hypertrophy. Right Ventricle: The right ventricular size is normal. No increase in right ventricular wall thickness. Right ventricular systolic function is normal. Tricuspid regurgitation signal is inadequate for assessing PA pressure. Pericardium: A moderately sized pericardial effusion is present. The pericardial effusion is posterior to the left ventricle and lateral to the left ventricle. There is no evidence of cardiac tamponade. Presence of epicardial fat layer. Tricuspid Valve: The tricuspid valve is grossly normal. Tricuspid valve regurgitation is trivial. Aortic Valve: The aortic valve is tricuspid. There is mild aortic valve annular calcification. Aortic valve regurgitation is not visualized. Venous: The inferior vena cava is dilated in size with greater than 50% respiratory variability, suggesting right atrial pressure of 8 mmHg. LEFT VENTRICLE PLAX 2D LVIDd:         5.30 cm LVIDs:         3.70 cm LV PW:         1.10 cm LV IVS:        1.10 cm  LV Volumes (MOD) LV vol d, MOD A4C: 217.0 ml LV vol s, MOD A4C: 107.0 ml LV SV MOD A4C:     217.0 ml IVC IVC diam: 2.50 cm Nona Dell MD Electronically signed by Nona Dell MD Signature Date/Time: 09/29/2023/1:51:03 PM    Final     Scheduled Meds:  feeding supplement  237 mL Oral BID BM   insulin aspart  0-15 Units Subcutaneous Q4H   ipratropium-albuterol  3 mL Nebulization TID   metoprolol succinate  25 mg Oral BID   nitroGLYCERIN  0.4 mg Sublingual Once   nystatin   Topical BID   octreotide  100 mcg Subcutaneous Q12H   pantoprazole (PROTONIX) IV  40 mg Intravenous Q12H   polyethylene glycol  17 g Oral Daily    rosuvastatin  40 mg Oral Daily   senna-docusate  2 tablet Oral QHS   spironolactone  12.5 mg Oral Daily   Continuous Infusions:   LOS: 4 days   [name] M.D on 09/29/2023 at 4:55 PM  Go to [email or web address] - for contact info  [facility] - Office  [phone]  If 7PM-7AM, please contact night-coverage [email or web address] 09/29/2023, 4:55 PM

## 2023-09-29 NOTE — Progress Notes (Addendum)
 Patient has been stable this shift, with complaints of insomnia and chronic back pain.  Patient medicated per MD order. Standing weight this am is 123 kg

## 2023-09-29 NOTE — Progress Notes (Addendum)
  Subjective: Denies abdomina pain, no BMs this am. Passing some flatus but denies any nausea or vomiting. Able to tolerate some breakfast without issues. No sob, dizziness or fatigue.   Objective: Vital signs in last 24 hours: Temp:  [97.5 F (36.4 C)-98.9 F (37.2 C)] 98.7 F (37.1 C) (01/15 0518) Pulse Rate:  [88-107] 96 (01/15 0836) Resp:  [15-28] 20 (01/15 0836) BP: (101-141)/(49-63) 107/62 (01/15 0836) SpO2:  [85 %-98 %] 94 % (01/15 0849) Weight:  [123.1 kg] 123.1 kg (01/15 0500) Last BM Date : 09/24/23 General:   Alert and oriented, pleasant Head:  Normocephalic and atraumatic. Eyes:  No icterus, sclera clear. Conjuctiva pink.  Mouth:  Without lesions, mucosa pink and moist.  Neck:  Supple, without thyromegaly or masses.  Heart:  S1, S2 present, no murmurs noted.  Lungs: wheezing and rhonchi throughout  Abdomen:  Bowel sounds present, soft, non-tender, non-distended. No HSM or hernias noted. No rebound or guarding. No masses appreciated  Neurologic:  Alert and  oriented x4;  grossly normal neurologically. Skin:  Warm and dry, intact without significant lesions.  Psych:  Alert and cooperative. Normal mood and affect.  Intake/Output from previous day: 01/14 0701 - 01/15 0700 In: 1080 [P.O.:480; I.V.:600] Out: 2050 [Urine:2050] Intake/Output this shift: No intake/output data recorded.  Lab Results: Recent Labs    09/27/23 0304 09/28/23 0438 09/29/23 0542  WBC 10.6* 10.8* 11.0*  HGB 7.9* 7.6* 7.7*  HCT 29.4* 29.6* 29.3*  PLT 150 159 175   BMET Recent Labs    09/27/23 0304 09/28/23 0438 09/29/23 0542  NA 136 135 134*  K 2.9* 3.2* 4.0  CL 93* 92* 91*  CO2 34* 35* 34*  GLUCOSE 122* 143* 158*  BUN 12 15 19   CREATININE 1.15 1.19 1.25*  CALCIUM  9.1 9.0 9.1   LFT Recent Labs    09/28/23 0438  ALBUMIN 3.2*    Assessment: Gregory Alvarado is a 73 year old male with history of chronic systolic and diastolic CHF, CAD with DES (12/21), type 2 diabetes,  hyperlipidemia, GI bleed, COPD, chronic respiratory failure on oxygen  at 2 to 3 L, recurrent AVMs who presented with worsening chest pain, admitted with acute on chronic CHF.  GI consulted due to chronic melena and worsening anemia.  Acute on chronic anemia: Hemoglobin 6.2 on admission, status post 2 units packed red blood cells hemoglobin improved to 8.  No overt GI bleeding since presenting to the ED.  He denied NSAIDs.  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  Recommended PPI twice daily, octreotide  100mcg SQ twice daily.  Hemoglobin maintaining at 7.7 this morning, was 7.6 yesterday. Recommend monitoring overnight and may be able to discharge tomorrow if stable.   He is feeling okay this morning. Tolerated breakfast. No GI complaints.    Plan: Continue octreotide  100 mcg PPI twice daily Trend H&H, monitor for overt GI bleeding Consider transition to octreotide  LAR as outpatient   LOS: 4 days    09/29/2023, 8:54 AM   Edgard Debord L. Keleigh Kazee, MSN, APRN, AGNP-C Adult-Gerontology Nurse Practitioner [facility] at [facility]

## 2023-09-29 NOTE — Progress Notes (Signed)
 Mobility Specialist Progress Note:    09/29/23 1423  Mobility  Activity Ambulated with assistance in room;Transferred from bed to chair  Level of Assistance Standby assist, set-up cues, supervision of patient - no hands on  Assistive Device Front wheel walker  Distance Ambulated (ft) 50 ft  Range of Motion/Exercises Active;All extremities  Activity Response Tolerated well  Mobility Referral Yes  Mobility visit 1 Mobility  Mobility Specialist Start Time (ACUTE ONLY) 1405  Mobility Specialist Stop Time (ACUTE ONLY) 1420  Mobility Specialist Time Calculation (min) (ACUTE ONLY) 15 min   Pt received sitting EOB, agreeable to mobility. Required SBA to stand and ambulate with RW. Tolerated well, asx throughout. Left pt in chair, call bell in reach. All needs met.   Glinda Lapping Mobility Specialist Please contact via Special educational needs teacher or  Rehab office at [phone]

## 2023-09-29 NOTE — Progress Notes (Signed)
 error

## 2023-09-29 NOTE — Telephone Encounter (Signed)
 Octreotide  needs to be sent to [facility]. Labs entered into Epic. Spoke to pt's son. Pt is still in hospital. Not sure when he will be discharged.

## 2023-09-29 NOTE — Care Management Important Message (Signed)
 Important Message  Patient Details  Name: Gregory Alvarado MRN: [id number] Date of Birth: [birth year]   Important Message Given:  Yes - Medicare IM     Montasia Chisenhall L Brittney Caraway 09/29/2023, 4:57 PM

## 2023-09-29 NOTE — Progress Notes (Signed)
  Echocardiogram 2D Echocardiogram has been performed.  Deni Berti L Zian Delair RDCS 09/29/2023, 1:43 PM

## 2023-09-29 NOTE — Progress Notes (Signed)
 Progress Note  Patient Name: Gregory Alvarado Date of Encounter: 09/29/2023  Primary Cardiologist: Armida Lander, MD  Interval Summary   Interval chart reviewed.  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  Continues on PPI and octreotide .  Hemoglobin is stable at 7.7.  Additionally net urine output of approximately 1000 cc last 24 hours.  Vital Signs    Vitals:   09/29/23 0500 09/29/23 0518 09/29/23 0836 09/29/23 0849  BP:  101/61 107/62   Pulse:   96   Resp:  20 20   Temp:  98.7 F (37.1 C)    TempSrc:      SpO2:  94% 95% 94%  Weight: 123.1 kg     Height:        Intake/Output Summary (Last 24 hours) at 09/29/2023 1035 Last data filed at 09/29/2023 0800 Gross per 24 hour  Intake 1380 ml  Output 1150 ml  Net 230 ml   Filed Weights   09/28/23 0044 09/28/23 0352 09/29/23 0500  Weight: 123.7 kg 123.7 kg 123.1 kg    Physical Exam   GEN: No acute distress.   Neck: No JVD. Cardiac: RRR without gallop or rub.  Respiratory: Nonlabored.  Decreased breath sounds, no crackles. GI: Soft, nontender, bowel sounds present. MS: Mild, improved lower leg edema.  ECG/Telemetry    Currently not on telemetry.  Labs    Chemistry Recent Labs  Lab 09/25/23 2136 09/26/23 0622 09/27/23 0304 09/28/23 0438 09/29/23 0542  NA 133* 136 136 135 134*  K 3.8 3.4* 2.9* 3.2* 4.0  CL 98 96* 93* 92* 91*  CO2 31 33* 34* 35* 34*  GLUCOSE 123* 143* 122* 143* 158*  BUN 14 14 12 15 19   CREATININE 0.98 1.06 1.15 1.19 1.25*  CALCIUM  9.0 9.1 9.1 9.0 9.1  PROT 6.7 7.0  --   --   --   ALBUMIN 3.2* 3.4*  --  3.2*  --   AST 16 14*  --   --   --   ALT 12 10  --   --   --   ALKPHOS 59 63  --   --   --   BILITOT 0.5 1.0  --   --   --   GFRNONAA >60 >60 >60 >60 >60  ANIONGAP 4* 7 9 8 9     Hematology Recent Labs  Lab 09/27/23 0304 09/28/23 0438  09/29/23 0542  WBC 10.6* 10.8* 11.0*  RBC 3.69* 3.69* 3.69*  HGB 7.9* 7.6* 7.7*  HCT 29.4* 29.6* 29.3*  MCV 79.7* 80.2 79.4*  MCH 21.4* 20.6* 20.9*  MCHC 26.9* 25.7* 26.3*  RDW 19.1* 19.1* 19.3*  PLT 150 159 175   Cardiac Enzymes Recent Labs  Lab 09/25/23 2136 09/25/23 2316  TROPONINIHS 16 17   Lipid Panel     Component Value Date/Time   CHOL 186 02/18/2017 0737   TRIG 219 (H) 07/26/2022 0410   HDL 28 (L) 02/18/2017 0737   LDLCALC 92 02/18/2017 0737   LABVLDL 66 (H) 02/18/2017 0737    Cardiac Studies   Echocardiogram 09/26/2023:  1. Left ventricular ejection fraction, by estimation, is 50 to 55%. The  left ventricle has low normal function. The left ventricle demonstrates  regional wall motion abnormalities (see scoring diagram/findings for  description). There is mild concentric  left ventricular hypertrophy. Left ventricular diastolic parameters are  indeterminate. Elevated left ventricular end-diastolic pressure.  2. Right ventricular systolic function is normal. The right ventricular  size is normal. Tricuspid regurgitation signal is inadequate for assessing  PA pressure.   3. Left atrial size was severely dilated.   4. Right atrial size was mildly dilated.   5. Moderate pericardial effusion. The pericardial effusion is lateral to  the left ventricle. There is no evidence of cardiac tamponade.   6. The mitral valve is normal in structure. No evidence of mitral valve  regurgitation. No evidence of mitral stenosis.   7. The aortic valve is normal in structure. Aortic valve regurgitation is  not visualized. No aortic stenosis is present.   8. The inferior vena cava is dilated in size with >50% respiratory  variability, suggesting right atrial pressure of 8 mmHg.   Assessment & Plan   1.  HFpEF (history of HFimpEF), LVEF 50 to 55% by echocardiogram during this admission.  He has responded quite well to IV diuresis, ReDS clip 36 yesterday.  2.  Moderate  pericardial effusion without tamponade physiology by echocardiogram on January 12.  3.  CAD status post DES to the proximal LAD, DES to the circumflex, and DES to OM 2 in 2021 in the setting of NSTEMI.  No active angina.  Aspirin  help with recent anemia.  4.  COPD with chronic hypoxic respiratory failure on supplemental oxygen .  5.  Symptomatic anemia, hemoglobin 6.2 on admission with GI bleed.  Status post EGD/push enteroscopy yesterday as outlined above and hemoglobin stable today at 7.7.  Plan to repeat limited echocardiogram to follow-up on pericardial effusion.  If stable, no further workup anticipated at this time.  Stopping IV Lasix , initiate Aldactone  12.5 mg daily.  If no further procedures are planned can resume Jardiance  as well.  As needed Lasix  can be used as an outpatient as before.  He is otherwise on Toprol -XL, Crestor , and as needed nitroglycerin .  Aspirin  can be resumed when cleared by the GI team.  Fimor questions or updates, please contact [facility] Please consult [email or web address] for contact info under   Signed, Teddie Favre, MD  09/29/2023, 10:35 AM

## 2023-09-30 ENCOUNTER — Encounter (HOSPITAL_COMMUNITY): Payer: Self-pay | Admitting: Gastroenterology

## 2023-09-30 DIAGNOSIS — E782 Mixed hyperlipidemia: Secondary | ICD-10-CM | POA: Diagnosis not present

## 2023-09-30 DIAGNOSIS — I5043 Acute on chronic combined systolic (congestive) and diastolic (congestive) heart failure: Secondary | ICD-10-CM | POA: Diagnosis not present

## 2023-09-30 DIAGNOSIS — K921 Melena: Secondary | ICD-10-CM | POA: Diagnosis not present

## 2023-09-30 DIAGNOSIS — I10 Essential (primary) hypertension: Secondary | ICD-10-CM | POA: Diagnosis not present

## 2023-09-30 DIAGNOSIS — D62 Acute posthemorrhagic anemia: Secondary | ICD-10-CM

## 2023-09-30 DIAGNOSIS — R7989 Other specified abnormal findings of blood chemistry: Secondary | ICD-10-CM | POA: Diagnosis not present

## 2023-09-30 DIAGNOSIS — D509 Iron deficiency anemia, unspecified: Secondary | ICD-10-CM | POA: Diagnosis not present

## 2023-09-30 DIAGNOSIS — K31819 Angiodysplasia of stomach and duodenum without bleeding: Secondary | ICD-10-CM | POA: Diagnosis not present

## 2023-09-30 LAB — CBC
HCT: 28.3 % — ABNORMAL LOW (ref 39.0–52.0)
Hemoglobin: 7.5 g/dL — ABNORMAL LOW (ref 13.0–17.0)
MCH: 20.9 pg — ABNORMAL LOW (ref 26.0–34.0)
MCHC: 26.5 g/dL — ABNORMAL LOW (ref 30.0–36.0)
MCV: 78.8 fL — ABNORMAL LOW (ref 80.0–100.0)
Platelets: 156 10*3/uL (ref 150–400)
RBC: 3.59 MIL/uL — ABNORMAL LOW (ref 4.22–5.81)
RDW: 19.2 % — ABNORMAL HIGH (ref 11.5–15.5)
WBC: 9.5 10*3/uL (ref 4.0–10.5)
nRBC: 0 % (ref 0.0–0.2)

## 2023-09-30 LAB — GLUCOSE, CAPILLARY
Glucose-Capillary: 170 mg/dL — ABNORMAL HIGH (ref 70–99)
Glucose-Capillary: 178 mg/dL — ABNORMAL HIGH (ref 70–99)
Glucose-Capillary: 185 mg/dL — ABNORMAL HIGH (ref 70–99)
Glucose-Capillary: 281 mg/dL — ABNORMAL HIGH (ref 70–99)

## 2023-09-30 LAB — MAGNESIUM: Magnesium: 1.9 mg/dL (ref 1.7–2.4)

## 2023-09-30 MED ORDER — SPIRONOLACTONE 25 MG PO TABS: 12.5000 mg | ORAL_TABLET | Freq: Every day | ORAL | 1 refills | Status: AC

## 2023-09-30 MED ORDER — PANTOPRAZOLE SODIUM 40 MG PO TBEC: 40.0000 mg | DELAYED_RELEASE_TABLET | Freq: Two times a day (BID) | ORAL | 2 refills | Status: DC

## 2023-09-30 MED ORDER — METOPROLOL SUCCINATE ER 25 MG PO TB24: 25.0000 mg | ORAL_TABLET | Freq: Two times a day (BID) | ORAL | 1 refills | Status: DC

## 2023-09-30 NOTE — Progress Notes (Signed)
Gastroenterology Progress Note   Referring Provider: No ref. provider found Primary Care Physician:  Benita Stabile, MD Primary Gastroenterologist:  Hennie Duos. Marletta Lor, DO Patient ID: Moustafa Gladd Mcdermid; [id number]; [birth year]   Subjective:    Wants to go home. No complaints.   Objective:   Vital signs in last 24 hours: Temp:  [97.4 F (36.3 C)-98.7 F (37.1 C)] 98.2 F (36.8 C) (01/16 0354) Pulse Rate:  [81-87] 84 (01/16 0912) Resp:  [18-20] 18 (01/16 0912) BP: (115-141)/(47-63) 134/47 (01/16 0912) SpO2:  [93 %-100 %] 100 % (01/16 0912) Weight:  [123.5 kg] 123.5 kg (01/16 0500) Last BM Date : 09/24/23 General:   Alert,   pleasant and cooperative in NAD Head:  Normocephalic and atraumatic. Eyes:  Sclera clear, no icterus.   Abdomen:  Soft, nontender and nondistended. Normal bowel sounds, without guarding, and without rebound.     Intake/Output from previous day: 01/15 0701 - 01/16 0700 In: 1380 [P.O.:1380] Out: 2300 [Urine:2300] Intake/Output this shift: Total I/O In: 240 [P.O.:240] Out: -   Lab Results: CBC Recent Labs    09/28/23 0438 09/29/23 0542 09/30/23 0401  WBC 10.8* 11.0* 9.5  HGB 7.6* 7.7* 7.5*  HCT 29.6* 29.3* 28.3*  MCV 80.2 79.4* 78.8*  PLT 159 175 156   BMET Recent Labs    09/28/23 0438 09/29/23 0542  NA 135 134*  K 3.2* 4.0  CL 92* 91*  CO2 35* 34*  GLUCOSE 143* 158*  BUN 15 19  CREATININE 1.19 1.25*  CALCIUM 9.0 9.1   LFTs Recent Labs    09/28/23 0438  ALBUMIN 3.2*   No results for input(s): "LIPASE" in the last 72 hours. PT/INR No results for input(s): "LABPROT", "INR" in the last 72 hours.       Imaging Studies: ECHOCARDIOGRAM LIMITED Result Date: 09/29/2023    ECHOCARDIOGRAM LIMITED REPORT   Patient Name:   JACE FREIER Date of Exam: 09/29/2023 Medical Rec #:  [id number]     Height:       72.0 in Accession #:    [id number]    Weight:       271.4 lb Date of Birth:  Jul 02, 1951     BSA:          2.426 m Patient Age:    72 years       BP:           107/62 mmHg Patient Gender: M             HR:           80 bpm. [facility] Procedure: Limited Echo, Limited Color Doppler and Cardiac Doppler Indications:    Pericardial effusion  History:        Patient has prior history of Echocardiogram examinations, most                 recent 09/26/2023. CHF, CAD, COPD, Arrythmias:Cardiac Arrest,                 Signs/Symptoms:Syncope; Risk Factors:Hypertension, Diabetes and                 Dyslipidemia.  Sonographer:    Vern Claude Referring Phys: [id number] SAMUEL G MCDOWELL IMPRESSIONS  1. Limited study.  2. Left ventricular ejection fraction, by estimation, is approximately 55%. The left ventricle has normal function. Left ventricular endocardial border not optimally defined to evaluate regional wall motion. There is mild concentric left ventricular hypertrophy.  3. Right ventricular  systolic function is normal. The right ventricular size is normal. Tricuspid regurgitation signal is inadequate for assessing PA pressure.  4. Moderate pericardial effusion. The pericardial effusion is posterior to the left ventricle and lateral to the left ventricle. There is no evidence of cardiac tamponade.  5. The aortic valve is tricuspid. Aortic valve regurgitation is not visualized.  6. The inferior vena cava is dilated in size with >50% respiratory variability, suggesting right atrial pressure of 8 mmHg. Comparison(s): Prior images reviewed side by side. No significant change in moderate, posterolateral pericardial effusion. FINDINGS  Left Ventricle: Left ventricular ejection fraction, by estimation, is 55%. The left ventricle has normal function. Left ventricular endocardial border not optimally defined to evaluate regional wall motion. The left ventricular internal cavity size was normal in size. There is mild concentric left ventricular hypertrophy. Right Ventricle: The right ventricular size is normal. No increase in right ventricular wall thickness. Right  ventricular systolic function is normal. Tricuspid regurgitation signal is inadequate for assessing PA pressure. Pericardium: A moderately sized pericardial effusion is present. The pericardial effusion is posterior to the left ventricle and lateral to the left ventricle. There is no evidence of cardiac tamponade. Presence of epicardial fat layer. Tricuspid Valve: The tricuspid valve is grossly normal. Tricuspid valve regurgitation is trivial. Aortic Valve: The aortic valve is tricuspid. There is mild aortic valve annular calcification. Aortic valve regurgitation is not visualized. Venous: The inferior vena cava is dilated in size with greater than 50% respiratory variability, suggesting right atrial pressure of 8 mmHg. LEFT VENTRICLE PLAX 2D LVIDd:         5.30 cm LVIDs:         3.70 cm LV PW:         1.10 cm LV IVS:        1.10 cm  LV Volumes (MOD) LV vol d, MOD A4C: 217.0 ml LV vol s, MOD A4C: 107.0 ml LV SV MOD A4C:     217.0 ml IVC IVC diam: 2.50 cm Nona Dell MD Electronically signed by Nona Dell MD Signature Date/Time: 09/29/2023/1:51:03 PM    Final    ECHOCARDIOGRAM COMPLETE Result Date: 09/26/2023    ECHOCARDIOGRAM REPORT   Patient Name:   GERAMIE MORTAZAVI Date of Exam: 09/26/2023 Medical Rec #:  [id number]     Height:       72.0 in Accession #:    [id number]    Weight:       286.6 lb Date of Birth:  1951-03-05     BSA:          2.483 m Patient Age:    72 years      BP:           157/74 mmHg Patient Gender: M             HR:           94 bpm. [facility] Procedure: 2D Echo, Cardiac Doppler and Color Doppler Indications:    CHF-Acute Diastolic I50.31  History:        Patient has prior history of Echocardiogram examinations, most                 recent 10/22/2022. CHF, CAD, COPD, Signs/Symptoms:Syncope; Risk                 Factors:Hypertension, Diabetes and Dyslipidemia. Tobacco use                 (From Hx), Acute on  chronic combined systolic and diastolic CHF                  (congestive heart failure) (HCC).  Sonographer:    Celesta Gentile RCS Referring Phys: [id number] OLADAPO ADEFESO  Sonographer Comments: Technically difficult study due to poor echo windows. Patient has no I.V. at this time. IMPRESSIONS  1. Left ventricular ejection fraction, by estimation, is 50 to 55%. The left ventricle has low normal function. The left ventricle demonstrates regional wall motion abnormalities (see scoring diagram/findings for description). There is mild concentric left ventricular hypertrophy. Left ventricular diastolic parameters are indeterminate. Elevated left ventricular end-diastolic pressure.  2. Right ventricular systolic function is normal. The right ventricular size is normal. Tricuspid regurgitation signal is inadequate for assessing PA pressure.  3. Left atrial size was severely dilated.  4. Right atrial size was mildly dilated.  5. Moderate pericardial effusion. The pericardial effusion is lateral to the left ventricle. There is no evidence of cardiac tamponade.  6. The mitral valve is normal in structure. No evidence of mitral valve regurgitation. No evidence of mitral stenosis.  7. The aortic valve is normal in structure. Aortic valve regurgitation is not visualized. No aortic stenosis is present.  8. The inferior vena cava is dilated in size with >50% respiratory variability, suggesting right atrial pressure of 8 mmHg. FINDINGS  Left Ventricle: Left ventricular ejection fraction, by estimation, is 50 to 55%. The left ventricle has low normal function. The left ventricle demonstrates regional wall motion abnormalities. The left ventricular internal cavity size was normal in size. There is mild concentric left ventricular hypertrophy. Left ventricular diastolic parameters are indeterminate. Elevated left ventricular end-diastolic pressure.  LV Wall Scoring: The apical septal segment, apical inferior segment, and apex are akinetic. The entire anterior wall, entire lateral wall, anterior  septum, inferior wall, mid inferoseptal segment, and basal inferoseptal segment are hypokinetic. Right Ventricle: The right ventricular size is normal. No increase in right ventricular wall thickness. Right ventricular systolic function is normal. Tricuspid regurgitation signal is inadequate for assessing PA pressure. Left Atrium: Left atrial size was severely dilated. Right Atrium: Right atrial size was mildly dilated. Pericardium: A moderately sized pericardial effusion is present. The pericardial effusion is lateral to the left ventricle. There is no evidence of cardiac tamponade. Presence of epicardial fat layer. Mitral Valve: The mitral valve is normal in structure. No evidence of mitral valve regurgitation. No evidence of mitral valve stenosis. Tricuspid Valve: The tricuspid valve is normal in structure. Tricuspid valve regurgitation is not demonstrated. No evidence of tricuspid stenosis. Aortic Valve: The aortic valve is normal in structure. Aortic valve regurgitation is not visualized. No aortic stenosis is present. Pulmonic Valve: The pulmonic valve was normal in structure. Pulmonic valve regurgitation is not visualized. No evidence of pulmonic stenosis. Aorta: The aortic root is normal in size and structure. Venous: The inferior vena cava is dilated in size with greater than 50% respiratory variability, suggesting right atrial pressure of 8 mmHg. IAS/Shunts: No atrial level shunt detected by color flow Doppler.  LEFT VENTRICLE PLAX 2D LVIDd:         4.80 cm   Diastology LVIDs:         3.30 cm   LV e' medial:    6.31 cm/s LV PW:         1.30 cm   LV E/e' medial:  19.8 LV IVS:        1.20 cm   LV e' lateral:   6.85 cm/s  LVOT diam:     2.00 cm   LV E/e' lateral: 18.2 LV SV:         71 LV SV Index:   28 LVOT Area:     3.14 cm  RIGHT VENTRICLE RV S prime:     12.30 cm/s TAPSE (M-mode): 2.5 cm LEFT ATRIUM              Index        RIGHT ATRIUM           Index LA diam:        5.20 cm  2.09 cm/m   RA Area:      23.50 cm LA Vol (A2C):   127.0 ml 51.16 ml/m  RA Volume:   69.00 ml  27.79 ml/m LA Vol (A4C):   116.0 ml 46.73 ml/m LA Biplane Vol: 129.0 ml 51.96 ml/m  AORTIC VALVE LVOT Vmax:   112.00 cm/s LVOT Vmean:  70.700 cm/s LVOT VTI:    0.225 m  AORTA Ao Root diam: 3.80 cm MITRAL VALVE MV Area (PHT): 2.66 cm     SHUNTS MV Decel Time: 285 msec     Systemic VTI:  0.22 m MV E velocity: 125.00 cm/s  Systemic Diam: 2.00 cm MV A velocity: 141.00 cm/s MV E/A ratio:  0.89 Kardie Tobb DO Electronically signed by Thomasene Ripple DO Signature Date/Time: 09/26/2023/2:27:45 PM    Final    DG Chest Port 1 View Result Date: 09/25/2023 CLINICAL DATA:  Shortness of breath, history of CHF EXAM: PORTABLE CHEST 1 VIEW COMPARISON:  07/31/2022 FINDINGS: Cardiomegaly with mild interstitial edema. Small to moderate left pleural effusion. No pneumothorax. IMPRESSION: Cardiomegaly with mild interstitial edema. Small to moderate left pleural effusion. Electronically Signed   By: Charline Bills M.D.   On: 09/25/2023 21:57  [2 weeks]  Assessment/Plan:   Jonelle Sports. Cervini is a 73 year old male with history of chronic systolic and diastolic CHF, CAD with DES (12/21), type 2 diabetes, hyperlipidemia, GI bleed, COPD, chronic respiratory failure on oxygen at 2 to 3 L, recurrent AVMs who presented with worsening chest pain, admitted with acute on chronic CHF. GI consulted due to chronic melena and worsening anemia.    Acute on chronic anemia: Hgb 6.2 on admission -2 units of prbcs, with post transfusion Hgb of 8. Hgb stable in mid 7 range for past 2 days. -EGD/push enteroscopy with 3 nonbleeding angiodysplastic lesions in stomach, s/p APC, 8 nonbleeding dysplastic lesions in the duodenum status post APC -Continue PPI twice daily -Plan for outpatient octreotide 100 mcg SQ twice daily -Recheck hemoglobin next Monday, we will make arrangements -Return office visit in 1 to 2 weeks -ok for discharge from GI standpoint   LOS: 5 days   Leanna Battles. Dixon Boos [facility] [phone] 1/16/202512:15 PM

## 2023-09-30 NOTE — Plan of Care (Signed)
Rested well during the night. Oxygen sats WNL while on 3l oxygen via nasal cannula. Diuresed well during the shift.  Problem: Education: Goal: Knowledge of General Education information will improve Description: Including pain rating scale, medication(s)/side effects and non-pharmacologic comfort measures Outcome: Progressing   Problem: Health Behavior/Discharge Planning: Goal: Ability to manage health-related needs will improve Outcome: Progressing   Problem: Clinical Measurements: Goal: Ability to maintain clinical measurements within normal limits will improve Outcome: Progressing Goal: Will remain free from infection Outcome: Progressing Goal: Diagnostic test results will improve Outcome: Progressing Goal: Respiratory complications will improve Outcome: Progressing Goal: Cardiovascular complication will be avoided Outcome: Progressing   Problem: Activity: Goal: Risk for activity intolerance will decrease Outcome: Progressing   Problem: Nutrition: Goal: Adequate nutrition will be maintained Outcome: Progressing   Problem: Coping: Goal: Level of anxiety will decrease Outcome: Progressing   Problem: Elimination: Goal: Will not experience complications related to bowel motility Outcome: Progressing Goal: Will not experience complications related to urinary retention Outcome: Progressing   Problem: Pain Management: Goal: General experience of comfort will improve Outcome: Progressing   Problem: Safety: Goal: Ability to remain free from injury will improve Outcome: Progressing   Problem: Skin Integrity: Goal: Risk for impaired skin integrity will decrease Outcome: Progressing   Problem: Education: Goal: Ability to describe self-care measures that may prevent or decrease complications (Diabetes Survival Skills Education) will improve Outcome: Progressing Goal: Individualized Educational Video(s) Outcome: Progressing   Problem: Coping: Goal: Ability to adjust  to condition or change in health will improve Outcome: Progressing   Problem: Fluid Volume: Goal: Ability to maintain a balanced intake and output will improve Outcome: Progressing   Problem: Health Behavior/Discharge Planning: Goal: Ability to identify and utilize available resources and services will improve Outcome: Progressing Goal: Ability to manage health-related needs will improve Outcome: Progressing   Problem: Metabolic: Goal: Ability to maintain appropriate glucose levels will improve Outcome: Progressing   Problem: Nutritional: Goal: Maintenance of adequate nutrition will improve Outcome: Progressing Goal: Progress toward achieving an optimal weight will improve Outcome: Progressing   Problem: Skin Integrity: Goal: Risk for impaired skin integrity will decrease Outcome: Progressing   Problem: Tissue Perfusion: Goal: Adequacy of tissue perfusion will improve Outcome: Progressing

## 2023-09-30 NOTE — Discharge Summary (Signed)
Physician Discharge Summary   Patient: Gregory Alvarado MRN: [id number] DOB: 05-Apr-1951  Admit date:     09/25/2023  Discharge date: 09/30/23  Discharge Physician: [name]   PCP: Benita Stabile, MD   Recommendations at discharge:  Repeat basic metabolic panel to follow ultralights renal function Repeat CBC to follow hemoglobin trend/stability. Make sure patient follow-up with gastroenterology service as instructed.  Discharge Diagnoses: Principal Problem:   Acute on chronic combined systolic and diastolic CHF (congestive heart failure) (HCC) Active Problems:   GI bleed   Elevated brain natriuretic peptide (BNP) level   Symptomatic anemia   Essential hypertension   Mixed hyperlipidemia   Obesity, Class III, BMI 40-49.9 (morbid obesity) (HCC)   Acute on chronic blood loss anemia   Type 2 diabetes mellitus with hyperglycemia (HCC)   Hypoalbuminemia due to protein-calorie malnutrition (HCC)   Leukocytosis   GERD (gastroesophageal reflux disease)   Chronic pain  Brief Hospital admission course: As per H&P written by Dr. Thomes Dinning on 09/25/2023 Gregory Alvarado is a 73 y.o. male with medical history significant of chronic systolic and diastolic CHF, CAD with DES (12/21), type 2 diabetes mellitus, hyperlipidemia, GI bleed, COPD, chronic respiratory failure on supplemental oxygen at 3 - 4 LPM of oxygen, tobacco abuse who presents to the emergency department due to several weeks onset of chest pain that has been worsening especially with exertion.  He states that he was prescribed diuretics in August 2024, but he does not have this medication at home.  Patient was told that his hemoglobin was around 7 prior to Christmas, but patient did not want to come to the emergency department around the festive period until now.  He endorsed noticing black stools, but denies alcohol use, patient also denies use of anti-inflammatory drugs.   ED Course:  In the emergency department, BP was 152/62, other vital  signs were within normal range, O2 sat was 97% on supplemental oxygen at 3 LPM.  Workup in the ED showed WBC 12.2, hemoglobin 6.2, hematocrit 24.7, MCV 80.7, platelets 158.  BMP was normal except for sodium of 133 and blood glucose of 123.  Albumin 3.2, BNP 637, FOBT was positive. Chest x-ray showed cardiomegaly with mild interstitial edema.  Small to moderate left pleural effusion IV Lasix 40 mg x 1 was given, Protonix x 1 was given, DuoNebs was provided and IV nitroglycerin 0.4 mg x 1 was given. Hospitalist was asked to admit patient for further evaluation and management.  Assessment and Plan: 1) acute on chronic combined diastolic and systolic dysfunction CHF/ICM Echo from 10/22/2022 showed a recovery of the heart with LVEF of 50 to 55% (from 40 to 45% on 08/28/2021). Apex is akinetic. Positive RWMA. Severe LVH. G1 DD.  -Repeat echo from 09/26/2023 with EF of 50 to 55% with wall motion abnormalities (The apical septal segment, apical inferior segment, and apex are akinetic. The entire anterior wall, entire lateral wall, anterior septum, inferior wall, mid inferoseptal segment, and basal inferoseptal segment are hypokinetic.) -Continue beta-blocker -Appreciate assistance and recommendation by cardiology service; diuretics transition to oral route using spironolactone. -Hold Jardiance -Daily weight, I's and O's and Reds vest   cardiology consult appreciated -Low-sodium diet discussed with patient.   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 Treated with argon plasma coagulation  (APC). - Normal examined duodenum.  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 Treated with argon plasma coagulation (APC).  --- Okay to change Protonix to 40 twice daily by mouth -- Started octreotide 100 mg [place] BID provided while inpatient. --Hemoglobin has remained stable around 7.5 prior to discharge; GI service to arrange follow-up and secure octreotide as an outpatient for patient.   3) CAD/ICM ---s/p  DES to prox/mid LAD, DES to prox LCX, DES to OM2. In December 2021 -Avoidstress ing aspirin due to GI bleed concerns -c/n Metoprolol and Crestor  -No chest pain.   4)COPD--had increasing wheezing after endoscopy procedure improving with bronchodilators - continue with bronchodilators and will recommend follow-up with pulmonologist down the road as an outpatient. -No significant wheezing and able to speak in full sentences at discharge.   5) acute on chronic hypoxic respiratory failure--multifactorial due to CHF and COPD -After receiving treatment with bronchodilator management and diuresis patient's respiratory status has stabilized and back to the use of 2 L supplementation. -Continue to follow clinical response. -Outpatient follow-up with cardiology service will be arranged   6)Tobacco Abuse--- smokes 2 packs/day -Smoking cessation counseling provided.   7)Class 2 Obesity- -Low calorie diet, portion control and increase physical activity discussed with patient. -Body mass index is 36.81 kg/m.   8) chronic leukocytosis----review of record reveals mild chronic leukocytosis ??  Etiology -- Consider outpatient hematology follow-up postdischarge. -No fever or complaints suggesting infection. -Continue monitoring WBCs trend intermittently.   9)DM2--- A1C 5.8 --reflecting excellent diabetic control PTA -Continue to hold Jardiance -Continue to follow CBG fluctuation and adjust medication as needed -Modified carbohydrate diet discussed with patient.   10)Moderate  pericardial effusion--- no tamponade  -Appreciate assistance and recommendation by cardiology service -Limited echo on September 26, 2023 demonstrating not physiologic tamponade. -Continue outpatient follow-up with cardiology service.   11)Hypokalemia- -has been repleted -Continue to follow electrolytes trend intermittently.    Consultants: GI service; cardiology service. Procedures performed: See below for x-ray reports. Disposition: Home Diet recommendation: Heart healthy/low-sodium and low calorie diet.  DISCHARGE MEDICATION: Allergies as of 09/30/2023       Reactions   Lisinopril Swelling   ACEi angioedema requiring intubation 07/2022   Caduet [amlodipine-atorvastatin] Swelling        Medication List     TAKE these medications    acetaminophen 500 MG tablet Commonly known as: TYLENOL Take 1,000 mg by mouth every 6 (six) hours as needed for mild pain.   albuterol 108 (90 Base) MCG/ACT inhaler Commonly known as: VENTOLIN HFA Inhale 2 puffs into the lungs every 4 (four) hours as needed for wheezing or shortness of breath.   Breztri Aerosphere 160-9-4.8 MCG/ACT Aero Generic drug: Budeson-Glycopyrrol-Formoterol Inhale 2 puffs into the lungs 2 (two) times daily.   cyanocobalamin 1000 MCG/ML injection Commonly known as: VITAMIN B12 Inject 1,000 mcg into the muscle every 30 (thirty) days.   diphenhydramine-acetaminophen 25-500 MG Tabs tablet Commonly known as: TYLENOL PM Take 1 tablet by mouth at bedtime as needed (sleep).   empagliflozin 10 MG Tabs tablet Commonly known as: JARDIANCE Take 1 tablet (10 mg total) by mouth daily.   insulin NPH-regular Human (70-30) 100 UNIT/ML injection Inject 40 Units into the skin in the morning and at bedtime.   levocetirizine 5 MG tablet Commonly known as:  XYZAL Take 5 mg by mouth at bedtime.   metoprolol succinate 25 MG 24 hr tablet Commonly known as: TOPROL-XL Take 1 tablet (25 mg total) by mouth 2 (two) times daily. What  changed:  medication strength how much to take when to take this   nitroGLYCERIN 0.4 MG SL tablet Commonly known as: NITROSTAT DISSOLVE 1 TABLET SUBLINGUALLY AS NEEDED FOR CHEST PAIN, MAY REPEAT EVERY 5 MINUTES. AFTER 3 CALL 911.   pantoprazole 40 MG tablet Commonly known as: Protonix Take 1 tablet (40 mg total) by mouth 2 (two) times daily.   rosuvastatin 40 MG tablet Commonly known as: CRESTOR Take 1 tablet (40 mg total) by mouth daily.   spironolactone 25 MG tablet Commonly known as: ALDACTONE Take 0.5 tablets (12.5 mg total) by mouth daily. Start taking on: October 01, 2023   traMADol 50 MG tablet Commonly known as: ULTRAM Take 50-100 mg by mouth in the morning and at bedtime.   zolpidem 5 MG tablet Commonly known as: AMBIEN Take 5 mg by mouth at bedtime as needed for sleep.        Follow-up Information     [name], MD Follow up on 10/29/2023.   Specialty: Cardiology Why: Keep scheduled Cardiology follow-up for 10/29/2023 at 4:00 PM. Contact information: [address] [place] [address] [phone]         Benita Stabile, MD. Schedule an appointment as soon as possible for a visit in 2 week(s).   Specialty: Internal Medicine Contact information: [address] [place] [address] [phone]                Discharge Exam: Filed Weights   09/28/23 0352 09/29/23 0500 09/30/23 0500  Weight: 123.7 kg 123.1 kg 123.5 kg   General exam: Alert, awake, oriented x 3; reporting no chest pain or shortness of breath.  No overt bleeding appreciated overnight.  In no acute distress and feeling ready to go home. Respiratory system: Good saturation on chronic supplementation.  Patient denies orthopnea and complains of shortness of breath.. Cardiovascular system: Rate controlled, no rubs, no gallops, no JVD. Gastrointestinal system: Abdomen is obese, nondistended, soft and nontender. No organomegaly or masses felt. Normal bowel sounds  heard. Central nervous system: No focal neurological deficits. Extremities: No cyanosis or clubbing.  Chronic trace edema appreciated bilaterally. Skin: No petechiae.  No open wounds appreciated. Psychiatry: Judgement and insight appear normal. Mood & affect appropriate.   Condition at discharge: Stable and improved.  The results of significant diagnostics from this hospitalization (including imaging, microbiology, ancillary and laboratory) are listed below for reference.   Imaging Studies: ECHOCARDIOGRAM LIMITED Result Date: 09/29/2023    ECHOCARDIOGRAM LIMITED REPORT   Patient Name:   CHI ILLESCAS Date of Exam: 09/29/2023 Medical Rec #:  [id number]     Height:       72.0 in Accession #:    [id number]    Weight:       271.4 lb Date of Birth:  29-Oct-1950     BSA:          2.426 m Patient Age:    72 years      BP:           107/62 mmHg Patient Gender: M             HR:           80 bpm. [facility] Procedure: Limited Echo, Limited Color Doppler and Cardiac  Doppler Indications:    Pericardial effusion  History:        Patient has prior history of Echocardiogram examinations, most                 recent 09/26/2023. CHF, CAD, COPD, Arrythmias:Cardiac Arrest,                 Signs/Symptoms:Syncope; Risk Factors:Hypertension, Diabetes and                 Dyslipidemia.  Sonographer:    Vern Claude Referring Phys: [id number] SAMUEL G MCDOWELL IMPRESSIONS  1. Limited study.  2. Left ventricular ejection fraction, by estimation, is approximately 55%. The left ventricle has normal function. Left ventricular endocardial border not optimally defined to evaluate regional wall motion. There is mild concentric left ventricular hypertrophy.  3. Right ventricular systolic function is normal. The right ventricular size is normal. Tricuspid regurgitation signal is inadequate for assessing PA pressure.  4. Moderate pericardial effusion. The pericardial effusion is posterior to the left ventricle and lateral to the  left ventricle. There is no evidence of cardiac tamponade.  5. The aortic valve is tricuspid. Aortic valve regurgitation is not visualized.  6. The inferior vena cava is dilated in size with >50% respiratory variability, suggesting right atrial pressure of 8 mmHg. Comparison(s): Prior images reviewed side by side. No significant change in moderate, posterolateral pericardial effusion. FINDINGS  Left Ventricle: Left ventricular ejection fraction, by estimation, is 55%. The left ventricle has normal function. Left ventricular endocardial border not optimally defined to evaluate regional wall motion. The left ventricular internal cavity size was normal in size. There is mild concentric left ventricular hypertrophy. Right Ventricle: The right ventricular size is normal. No increase in right ventricular wall thickness. Right ventricular systolic function is normal. Tricuspid regurgitation signal is inadequate for assessing PA pressure. Pericardium: A moderately sized pericardial effusion is present. The pericardial effusion is posterior to the left ventricle and lateral to the left ventricle. There is no evidence of cardiac tamponade. Presence of epicardial fat layer. Tricuspid Valve: The tricuspid valve is grossly normal. Tricuspid valve regurgitation is trivial. Aortic Valve: The aortic valve is tricuspid. There is mild aortic valve annular calcification. Aortic valve regurgitation is not visualized. Venous: The inferior vena cava is dilated in size with greater than 50% respiratory variability, suggesting right atrial pressure of 8 mmHg. LEFT VENTRICLE PLAX 2D LVIDd:         5.30 cm LVIDs:         3.70 cm LV PW:         1.10 cm LV IVS:        1.10 cm  LV Volumes (MOD) LV vol d, MOD A4C: 217.0 ml LV vol s, MOD A4C: 107.0 ml LV SV MOD A4C:     217.0 ml IVC IVC diam: 2.50 cm Nona Dell MD Electronically signed by Nona Dell MD Signature Date/Time: 09/29/2023/1:51:03 PM    Final    ECHOCARDIOGRAM  COMPLETE Result Date: 09/26/2023    ECHOCARDIOGRAM REPORT   Patient Name:   ASHLAN TARA Date of Exam: 09/26/2023 Medical Rec #:  [id number]     Height:       72.0 in Accession #:    [id number]    Weight:       286.6 lb Date of Birth:  Mar 27, 1951     BSA:          2.483 m Patient Age:    37 years  BP:           157/74 mmHg Patient Gender: M             HR:           94 bpm. [facility] Procedure: 2D Echo, Cardiac Doppler and Color Doppler Indications:    CHF-Acute Diastolic I50.31  History:        Patient has prior history of Echocardiogram examinations, most                 recent 10/22/2022. CHF, CAD, COPD, Signs/Symptoms:Syncope; Risk                 Factors:Hypertension, Diabetes and Dyslipidemia. Tobacco use                 (From Hx), Acute on chronic combined systolic and diastolic CHF                 (congestive heart failure) (HCC).  Sonographer:    Celesta Gentile RCS Referring Phys: [id number] OLADAPO ADEFESO  Sonographer Comments: Technically difficult study due to poor echo windows. Patient has no I.V. at this time. IMPRESSIONS  1. Left ventricular ejection fraction, by estimation, is 50 to 55%. The left ventricle has low normal function. The left ventricle demonstrates regional wall motion abnormalities (see scoring diagram/findings for description). There is mild concentric left ventricular hypertrophy. Left ventricular diastolic parameters are indeterminate. Elevated left ventricular end-diastolic pressure.  2. Right ventricular systolic function is normal. The right ventricular size is normal. Tricuspid regurgitation signal is inadequate for assessing PA pressure.  3. Left atrial size was severely dilated.  4. Right atrial size was mildly dilated.  5. Moderate pericardial effusion. The pericardial effusion is lateral to the left ventricle. There is no evidence of cardiac tamponade.  6. The mitral valve is normal in structure. No evidence of mitral valve regurgitation. No evidence of  mitral stenosis.  7. The aortic valve is normal in structure. Aortic valve regurgitation is not visualized. No aortic stenosis is present.  8. The inferior vena cava is dilated in size with >50% respiratory variability, suggesting right atrial pressure of 8 mmHg. FINDINGS  Left Ventricle: Left ventricular ejection fraction, by estimation, is 50 to 55%. The left ventricle has low normal function. The left ventricle demonstrates regional wall motion abnormalities. The left ventricular internal cavity size was normal in size. There is mild concentric left ventricular hypertrophy. Left ventricular diastolic parameters are indeterminate. Elevated left ventricular end-diastolic pressure.  LV Wall Scoring: The apical septal segment, apical inferior segment, and apex are akinetic. The entire anterior wall, entire lateral wall, anterior septum, inferior wall, mid inferoseptal segment, and basal inferoseptal segment are hypokinetic. Right Ventricle: The right ventricular size is normal. No increase in right ventricular wall thickness. Right ventricular systolic function is normal. Tricuspid regurgitation signal is inadequate for assessing PA pressure. Left Atrium: Left atrial size was severely dilated. Right Atrium: Right atrial size was mildly dilated. Pericardium: A moderately sized pericardial effusion is present. The pericardial effusion is lateral to the left ventricle. There is no evidence of cardiac tamponade. Presence of epicardial fat layer. Mitral Valve: The mitral valve is normal in structure. No evidence of mitral valve regurgitation. No evidence of mitral valve stenosis. Tricuspid Valve: The tricuspid valve is normal in structure. Tricuspid valve regurgitation is not demonstrated. No evidence of tricuspid stenosis. Aortic Valve: The aortic valve is normal in structure. Aortic valve regurgitation is not visualized. No aortic stenosis is  present. Pulmonic Valve: The pulmonic valve was normal in structure. Pulmonic  valve regurgitation is not visualized. No evidence of pulmonic stenosis. Aorta: The aortic root is normal in size and structure. Venous: The inferior vena cava is dilated in size with greater than 50% respiratory variability, suggesting right atrial pressure of 8 mmHg. IAS/Shunts: No atrial level shunt detected by color flow Doppler.  LEFT VENTRICLE PLAX 2D LVIDd:         4.80 cm   Diastology LVIDs:         3.30 cm   LV e' medial:    6.31 cm/s LV PW:         1.30 cm   LV E/e' medial:  19.8 LV IVS:        1.20 cm   LV e' lateral:   6.85 cm/s LVOT diam:     2.00 cm   LV E/e' lateral: 18.2 LV SV:         71 LV SV Index:   28 LVOT Area:     3.14 cm  RIGHT VENTRICLE RV S prime:     12.30 cm/s TAPSE (M-mode): 2.5 cm LEFT ATRIUM              Index        RIGHT ATRIUM           Index LA diam:        5.20 cm  2.09 cm/m   RA Area:     23.50 cm LA Vol (A2C):   127.0 ml 51.16 ml/m  RA Volume:   69.00 ml  27.79 ml/m LA Vol (A4C):   116.0 ml 46.73 ml/m LA Biplane Vol: 129.0 ml 51.96 ml/m  AORTIC VALVE LVOT Vmax:   112.00 cm/s LVOT Vmean:  70.700 cm/s LVOT VTI:    0.225 m  AORTA Ao Root diam: 3.80 cm MITRAL VALVE MV Area (PHT): 2.66 cm     SHUNTS MV Decel Time: 285 msec     Systemic VTI:  0.22 m MV E velocity: 125.00 cm/s  Systemic Diam: 2.00 cm MV A velocity: 141.00 cm/s MV E/A ratio:  0.89 Kardie Tobb DO Electronically signed by Thomasene Ripple DO Signature Date/Time: 09/26/2023/2:27:45 PM    Final    DG Chest Port 1 View Result Date: 09/25/2023 CLINICAL DATA:  Shortness of breath, history of CHF EXAM: PORTABLE CHEST 1 VIEW COMPARISON:  07/31/2022 FINDINGS: Cardiomegaly with mild interstitial edema. Small to moderate left pleural effusion. No pneumothorax. IMPRESSION: Cardiomegaly with mild interstitial edema. Small to moderate left pleural effusion. Electronically Signed   By: Charline Bills M.D.   On: 09/25/2023 21:57    Microbiology: Results for orders placed or performed during the hospital encounter of 07/25/22   MRSA Next Gen by PCR, Nasal     Status: None   Collection Time: 07/25/22  9:49 PM   Specimen: Nasal Mucosa; Nasal Swab  Result Value Ref Range Status   MRSA by PCR Next Gen NOT DETECTED NOT DETECTED Final    Comment: (NOTE) The GeneXpert MRSA Assay (FDA approved for NASAL specimens only), is one component of a comprehensive MRSA colonization surveillance program. It is not intended to diagnose MRSA infection nor to guide or monitor treatment for MRSA infections. Test performance is not FDA approved in patients less than 13 years old. Performed at [facility], [address] [place], [place] [address]     Labs: CBC: Recent Labs  Lab 09/25/23 2136 09/26/23 0622 09/27/23 0304 09/28/23  1610 09/29/23 0542 09/30/23 0401  WBC 12.2* 11.0* 10.6* 10.8* 11.0* 9.5  NEUTROABS 10.2*  --   --   --   --   --   HGB 6.2* 8.0* 7.9* 7.6* 7.7* 7.5*  HCT 24.7* 30.1* 29.4* 29.6* 29.3* 28.3*  MCV 80.7 80.3 79.7* 80.2 79.4* 78.8*  PLT 158 150 150 159 175 156   Basic Metabolic Panel: Recent Labs  Lab 09/25/23 2136 09/26/23 0622 09/27/23 0304 09/28/23 0438 09/29/23 0542 09/30/23 0401  NA 133* 136 136 135 134*  --   K 3.8 3.4* 2.9* 3.2* 4.0  --   CL 98 96* 93* 92* 91*  --   CO2 31 33* 34* 35* 34*  --   GLUCOSE 123* 143* 122* 143* 158*  --   BUN 14 14 12 15 19   --   CREATININE 0.98 1.06 1.15 1.19 1.25*  --   CALCIUM 9.0 9.1 9.1 9.0 9.1  --   MG  --  2.0  --   --  2.0 1.9  PHOS  --  2.2*  --  2.6  --   --    Liver Function Tests: Recent Labs  Lab 09/25/23 2136 09/26/23 0622 09/28/23 0438  AST 16 14*  --   ALT 12 10  --   ALKPHOS 59 63  --   BILITOT 0.5 1.0  --   PROT 6.7 7.0  --   ALBUMIN 3.2* 3.4* 3.2*   CBG: Recent Labs  Lab 09/29/23 2153 09/29/23 2359 09/30/23 0342 09/30/23 0755 09/30/23 1114  GLUCAP 212* 185* 170* 178* 281*    Discharge time spent: greater than 30 minutes.  Signed: [name], MD [facility] 09/30/2023

## 2023-09-30 NOTE — Progress Notes (Signed)
Mobility Specialist Progress Note:    09/30/23 1319  Mobility  Activity Refused mobility   Pt politely refused mobility, wants to rest for now. All needs met.   Lawerance Bach Mobility Specialist Please contact via Special educational needs teacher or  Rehab office at [phone]

## 2023-09-30 NOTE — Consult Note (Signed)
Value-Based Care [facility] Liaison Consult Note   09/30/2023  Baron Steinback Beldin 1950-11-21 [id number]  Insurance: [employer]   Primary Care Provider: Benita Stabile, MD and this provider is listed for the transition of care follow up appointments  and [facility] calls   [facility] Liaison screened the patient remotely at [facility]. Spoke with patient via bedside phone call [operator assisted].  HIPAA verified. Patient endorses PCP. Explained reason for call and regarding post hospital follow up.  Patient was accepting and generous.    The patient was screened for Acute on Chronic systolic HF at 4  day hospitalization with noted medium risk score for unplanned readmission risk 1 hospital admissions in 6 months.  The patient was assessed for potential Community Care Coordination service needs for post hospital transition for care coordination. Review of patient's electronic medical record reveals patient is AnBCI [facility] RN  for post hospital f/u.   Plan: [facility] Liaison will continue to follow progress and disposition to asess for post hospital community care coordination/management needs.  Referral request for community care coordination: Community TOC, patient states no other needs.   VBCI Community Care, Population Health does not replace or interfere with any arrangements made by the Inpatient Transition of Care team.   For questions contact:   Charlesetta Shanks, RN, BSN, CCM [place]  [facility], [facility] Liaison Direct Dial: [phone] or secure chat Email: [email or web address]

## 2023-09-30 NOTE — Progress Notes (Addendum)
Pt discharged via WC to POV. Pt's family did not bring pt's O2 concentrator, pt states he does not need it right now, he usually doesn't wear it during the day at home anyway. Pt states only a 20 minute ride home. Wife agreeable to transporting without O2. Pt belongings in his wife Teresa's possession at time of discharge.  Note: Pt still with no BM at time of discharge despite meds given. Pt states this is normal for him and he will drink several cups of warm prune juice when he gets home "and take care of the problem".

## 2023-09-30 NOTE — Progress Notes (Addendum)
Rounding Note    Patient Name: Gregory Alvarado Date of Encounter: 09/30/2023  [facility] Cardiologist: Dina Rich, MD   Subjective   Reports his breathing is back to baseline. No chest pain or palpitations. Asking about possible discharge as he was informed yesterday this would be dependent on follow-up labs. Hgb did slightly drop from 7.7 to 7.5.  Inpatient Medications    Scheduled Meds:  feeding supplement  237 mL Oral BID BM   insulin aspart  0-15 Units Subcutaneous Q4H   ipratropium-albuterol  3 mL Nebulization TID   metoprolol succinate  25 mg Oral BID   nitroGLYCERIN  0.4 mg Sublingual Once   nystatin   Topical BID   octreotide  100 mcg Subcutaneous Q12H   pantoprazole (PROTONIX) IV  40 mg Intravenous Q12H   polyethylene glycol  17 g Oral Daily   rosuvastatin  40 mg Oral Daily   senna-docusate  2 tablet Oral QHS   spironolactone  12.5 mg Oral Daily   Continuous Infusions:  PRN Meds: acetaminophen **OR** acetaminophen, diphenhydrAMINE, ipratropium-albuterol, prochlorperazine, traMADol   Vital Signs    Vitals:   09/29/23 2249 09/30/23 0354 09/30/23 0500 09/30/23 0804  BP: 123/63 (!) 141/60    Pulse:  87    Resp:  20    Temp:  98.2 F (36.8 C)    TempSrc:      SpO2:  98%  97%  Weight:   123.5 kg   Height:        Intake/Output Summary (Last 24 hours) at 09/30/2023 0843 Last data filed at 09/30/2023 0500 Gross per 24 hour  Intake 1080 ml  Output 2300 ml  Net -1220 ml      09/30/2023    5:00 AM 09/29/2023    5:00 AM 09/28/2023    3:52 AM  Last 3 Weights  Weight (lbs) 272 lb 4.3 oz 271 lb 6.2 oz 272 lb 11.3 oz  Weight (kg) 123.5 kg 123.1 kg 123.7 kg      Telemetry    NSR, HR in 70's to 80's with occasional PAC's and PVC's. Episodes of NSVT for up to 3 beats.  - Personally Reviewed  Physical Exam   GEN: Pleasant male appearing in no acute distress.   Neck: No JVD Cardiac: RRR, no murmurs, rubs, or gallops.  Respiratory: Scattered  rhonchi. No wheezing or rales GI: Soft, nontender, non-distended  MS: Trace lower extremity edema bilaterally; No deformity. Neuro:  Nonfocal  Psych: Normal affect   Labs    High Sensitivity Troponin:   Recent Labs  Lab 09/25/23 2136 09/25/23 2316  TROPONINIHS 16 17     Chemistry Recent Labs  Lab 09/25/23 2136 09/26/23 0622 09/27/23 0304 09/28/23 0438 09/29/23 0542 09/30/23 0401  NA 133* 136 136 135 134*  --   K 3.8 3.4* 2.9* 3.2* 4.0  --   CL 98 96* 93* 92* 91*  --   CO2 31 33* 34* 35* 34*  --   GLUCOSE 123* 143* 122* 143* 158*  --   BUN 14 14 12 15 19   --   CREATININE 0.98 1.06 1.15 1.19 1.25*  --   CALCIUM 9.0 9.1 9.1 9.0 9.1  --   MG  --  2.0  --   --  2.0 1.9  PROT 6.7 7.0  --   --   --   --   ALBUMIN 3.2* 3.4*  --  3.2*  --   --   AST 16 14*  --   --   --   --  ALT 12 10  --   --   --   --   ALKPHOS 59 63  --   --   --   --   BILITOT 0.5 1.0  --   --   --   --   GFRNONAA >60 >60 >60 >60 >60  --   ANIONGAP 4* 7 9 8 9   --     Lipids No results for input(s): "CHOL", "TRIG", "HDL", "LABVLDL", "LDLCALC", "CHOLHDL" in the last 168 hours.  Hematology Recent Labs  Lab 09/28/23 0438 09/29/23 0542 09/30/23 0401  WBC 10.8* 11.0* 9.5  RBC 3.69* 3.69* 3.59*  HGB 7.6* 7.7* 7.5*  HCT 29.6* 29.3* 28.3*  MCV 80.2 79.4* 78.8*  MCH 20.6* 20.9* 20.9*  MCHC 25.7* 26.3* 26.5*  RDW 19.1* 19.3* 19.2*  PLT 159 175 156   Thyroid No results for input(s): "TSH", "FREET4" in the last 168 hours.  BNP Recent Labs  Lab 09/25/23 2136  BNP 637.0*    DDimer No results for input(s): "DDIMER" in the last 168 hours.    Cardiac Studies   Echocardiogram: 09/26/2023 IMPRESSIONS     1. Left ventricular ejection fraction, by estimation, is 50 to 55%. The  left ventricle has low normal function. The left ventricle demonstrates  regional wall motion abnormalities (see scoring diagram/findings for  description). There is mild concentric  left ventricular hypertrophy. Left  ventricular diastolic parameters are  indeterminate. Elevated left ventricular end-diastolic pressure.   2. Right ventricular systolic function is normal. The right ventricular  size is normal. Tricuspid regurgitation signal is inadequate for assessing  PA pressure.   3. Left atrial size was severely dilated.   4. Right atrial size was mildly dilated.   5. Moderate pericardial effusion. The pericardial effusion is lateral to  the left ventricle. There is no evidence of cardiac tamponade.   6. The mitral valve is normal in structure. No evidence of mitral valve  regurgitation. No evidence of mitral stenosis.   7. The aortic valve is normal in structure. Aortic valve regurgitation is  not visualized. No aortic stenosis is present.   8. The inferior vena cava is dilated in size with >50% respiratory  variability, suggesting right atrial pressure of 8 mmHg.   Limited Echo: 09/29/2023 IMPRESSIONS     1. Limited study.   2. Left ventricular ejection fraction, by estimation, is approximately  55%. The left ventricle has normal function. Left ventricular endocardial  border not optimally defined to evaluate regional wall motion. There is  mild concentric left ventricular  hypertrophy.   3. Right ventricular systolic function is normal. The right ventricular  size is normal. Tricuspid regurgitation signal is inadequate for assessing  PA pressure.   4. Moderate pericardial effusion. The pericardial effusion is posterior  to the left ventricle and lateral to the left ventricle. There is no  evidence of cardiac tamponade.   5. The aortic valve is tricuspid. Aortic valve regurgitation is not  visualized.   6. The inferior vena cava is dilated in size with >50% respiratory  variability, suggesting right atrial pressure of 8 mmHg.   Comparison(s): Prior images reviewed side by side. No significant change  in moderate, posterolateral pericardial effusion.   Patient Profile     73 y.o. 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   Assessment & Plan    1. 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 BNP elevated at 637 on admission.  - Was previously receiving IV Lasix 40mg  BID with excellent output of -10.3 L this admission and weight has declined to 272 lbs (reports was around 300 lbs on his home scales prior to admission). ReDS vest at 27 yesterday. Creatinine had trended up to 1.25 yesterday and IV Lasix was held given improvement in his volume status. Has been started on Spironolactone 12.5mg  daily. Can restart Jardiance once determined he will not require further procedures. Remains on Toprol-XL. Previously had angioedema with ACE-I and therefore not on an ARB or Entresto.   2. Pericardial Effusion - Moderate by echocardiogram on admission and similar by repeat echo yesterday with no evidence of tamponade. Will need a repeat limited echo in 4-6 weeks as an outpatient for reassessment.    3. CAD - He is s/p NSTEMI in 2021 with DES to proximal-LAD, DES to LCx and DES to OM2. Initial and repeat Hs Troponin values have been negative at 16 and 17 this admission. No recent anginal symptoms.  - Continue Crestor 40mg  daily and Toprol-XL 25mg  BID. ASA held given anemia.    4. COPD - He is on 2L [place] at baseline.    5. Anemia - Hgb was at 6.2 upon admission and he received 2 units pRBC's. 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 Hgb at 7.5 today. Remains on Octreotide and IV Protonix.   For questions  or updates, please contact [facility] Please consult [email or web address] for contact info under        Signed, Ellsworth Lennox, PA-C  09/30/2023, 8:43 AM    Pt seen and examined   I agree with findings as noted by B Strader above  Pt comfortable laying in bed On exam Neck  Full Lungs   Rhonchi bilaterally  No rales  Cardiac RRR  No S3     Abd   Obese   Nontender Ext with trace edema  HFpEF  Pt has diuresed significantly as noted above      ReDS vest 27       Pt is going back to oral meds     Resume Jardiance prior to d/c  Pericardial effusion   Remains moderate  No hemodynamic instability    Will follow as outpt      Will sign off   Please call with questions     Pt will be set up for outpt follow up   Dietrich Pates MD

## 2023-10-01 ENCOUNTER — Telehealth: Payer: Self-pay

## 2023-10-01 ENCOUNTER — Other Ambulatory Visit: Payer: Self-pay | Admitting: Gastroenterology

## 2023-10-01 MED ORDER — OCTREOTIDE ACETATE 100 MCG/ML ~~LOC~~ SOSY: 100.0000 ug | PREFILLED_SYRINGE | Freq: Two times a day (BID) | SUBCUTANEOUS | 3 refills | Status: DC

## 2023-10-01 NOTE — Telephone Encounter (Signed)
Patient has been discharged from the hospital. Please make sure he is aware of Chelsea's recommendations regarding repeat labs. I will send in Rx for octreotide. We need to get him scheduled for follow-up as recommended.

## 2023-10-01 NOTE — Transitions of Care (Post Inpatient/ED Visit) (Signed)
10/01/2023  Name: Gregory Alvarado MRN: [id number] DOB: 09/25/1950  Today's TOC FU Call Status: Today's TOC FU Call Status:: Successful TOC FU Call Completed TOC FU Call Complete Date: 10/01/23 Patient's Name and Date of Birth confirmed.  Transition Care Management Follow-up Telephone Call Date of Discharge: 09/30/23 [facility] Type of Discharge: Inpatient Admission Primary Inpatient Discharge Diagnosis:: Acute on chronic combined systolic and diastolic CHF (congestive heart failure) How have you been since you were released from the hospital?: Better Any questions or concerns?: No  Items Reviewed: Did you receive and understand the discharge instructions provided?: Yes Medications obtained,verified, and reconciled?: Yes (Medications Reviewed) Any new allergies since your discharge?: No Dietary orders reviewed?: Yes Type of Diet Ordered:: Heart Healthy Low salt carb Modified Do you have support at home?: Yes People in Home: child(ren), adult Name of Support/Comfort Primary Source: Son Molly Maduro) and DIL live with him  Medications Reviewed Today: Medications Reviewed Today     Reviewed by Johnnette Barrios, RN (Registered Nurse) on 10/01/23 at 0935  Med List Status: <None>   Medication Order Taking? Sig Documenting Provider Last Dose Status Informant  acetaminophen (TYLENOL) 500 MG tablet [id number] Yes Take 1,000 mg by mouth every 6 (six) hours as needed for mild pain. [provider] Taking Active Self  albuterol (VENTOLIN HFA) 108 (90 Base) MCG/ACT inhaler [id number] Yes Inhale 2 puffs into the lungs every 4 (four) hours as needed for wheezing or shortness of breath. [provider] Taking Active Self  BREZTRI AEROSPHERE 160-9-4.8 MCG/ACT Sandrea Matte [id number] Yes Inhale 2 puffs into the lungs 2 (two) times daily. [provider] Taking Active Self  cyanocobalamin (,VITAMIN B-12,) 1000 MCG/ML injection [id number] Yes Inject 1,000 mcg into the  muscle every 30 (thirty) days. [provider] Taking Active Self  diphenhydramine-acetaminophen (TYLENOL PM) 25-500 MG TABS tablet [id number] Yes Take 1 tablet by mouth at bedtime as needed (sleep). [provider] Taking Active Self  empagliflozin (JARDIANCE) 10 MG TABS tablet [id number] Yes Take 1 tablet (10 mg total) by mouth daily. Marguerita Merles Gudiel, DO Taking Active Self  insulin NPH-regular Human (NOVOLIN 70/30) (70-30) 100 UNIT/ML injection [id number] Yes Inject 40 Units into the skin in the morning and at bedtime. [provider] Taking Active Self  levocetirizine (XYZAL) 5 MG tablet [id number] Yes Take 5 mg by mouth at bedtime. [provider] Taking Active Self  metoprolol succinate (TOPROL-XL) 25 MG 24 hr tablet [id number] Yes Take 1 tablet (25 mg total) by mouth 2 (two) times daily. [name], MD Taking Active   nitroGLYCERIN (NITROSTAT) 0.4 MG SL tablet [id number] Yes DISSOLVE 1 TABLET SUBLINGUALLY AS NEEDED FOR CHEST PAIN, MAY REPEAT EVERY 5 MINUTES. AFTER 3 CALL 911. [name], MD Taking Active Self           Med Note Elesa Massed, Johnnette Litter Sep 26, 2023  1:44 PM) Had 3   pantoprazole (PROTONIX) 40 MG tablet [id number] Yes Take 1 tablet (40 mg total) by mouth 2 (two) times daily. [name], MD Taking Active   rosuvastatin (CRESTOR) 40 MG tablet [id number] Yes Take 1 tablet (40 mg total) by mouth daily. [name], MD Taking Active Self  spironolactone (ALDACTONE) 25 MG tablet [id number] Yes Take 0.5 tablets (12.5 mg total) by mouth daily. [name], MD Taking Active   traMADol Janean Sark) 50 MG tablet [id number] Yes Take 50-100 mg by mouth in the morning and at bedtime. [provider] Taking Active  Self  zolpidem (AMBIEN) 5 MG tablet [id number] Yes Take 5 mg by mouth at bedtime as needed for sleep. [provider] Taking Active Self            Home Care and Equipment/Supplies: Were Home Health Services  Ordered?: No Any new equipment or medical supplies ordered?: No  Functional Questionnaire: Do you need assistance with bathing/showering or dressing?: Yes Do you need assistance with meal preparation?: Yes (Family preares meals) Do you need assistance with eating?: No Do you have difficulty maintaining continence: No Do you need assistance with getting out of bed/getting out of a chair/moving?: No Do you have difficulty managing or taking your medications?: No (Medication reconciliation completed based on recent discharge summary Patient taking medications as instructed and is aware of any changes or dosage adjustments medication regimen. Patient denies questions and reports no barriers to medication adherence)  Follow up appointments reviewed: PCP Follow-up appointment confirmed?: Yes Date of PCP follow-up appointment?: 10/01/23 Follow-up Provider: [facility] Follow-up appointment confirmed?: Yes Date of Specialist follow-up appointment?: 10/29/23 Follow-Up Specialty Provider:: Cardiology 2/14 Do you need transportation to your follow-up appointment?: No ([facility] transports)  SDOH Interventions Today    Flowsheet Row Most Recent Value  SDOH Interventions   Food Insecurity Interventions Intervention Not Indicated  Housing Interventions Intervention Not Indicated  Transportation Interventions Intervention Not Indicated, Patient Resources (Friends/Family)  Utilities Interventions Intervention Not Indicated  Social Connections Interventions Intervention Not Indicated, Other (Comment)  [Helives with family, reports several friends close by does not go to church due to difficulty getting ready early am]      Interventions Today    Flowsheet Row Most Recent Value  Chronic Disease   Chronic disease during today's visit Diabetes, Congestive Heart Failure (CHF)  General Interventions   General Interventions Discussed/Reviewed General Interventions Reviewed, Doctor Visits   Doctor Visits Discussed/Reviewed PCP, Specialist, Doctor Visits Reviewed  PCP/Specialist Visits Compliance with follow-up visit  Exercise Interventions   Exercise Discussed/Reviewed Physical Activity  Physical Activity Discussed/Reviewed Physical Activity Reviewed  Nutrition Interventions   Nutrition Discussed/Reviewed Nutrition Reviewed  Pharmacy Interventions   Pharmacy Dicussed/Reviewed Medications and their functions         Discussed [facility]  TOC program and weekly calls to patient to assess condition/status, medication management  and provide support/education as indicated . Patient/ Caregiver voiced understanding and declined enrollment in the 30-day TOC Program.  He is doing very well @ home has all his medications, Feels he has strong support at hi=ome and does not need additional follow-up After visit appointments confirmed    The patient has been provided with contact information for the care management team and has been advised to call with any health related questions or concerns.    Susa Loffler , BSN, RN [facility]   [facility] RN Care Manager Direct Dial [phone] Fax [phone] Website: [email or web address]

## 2023-10-03 NOTE — Anesthesia Postprocedure Evaluation (Signed)
Anesthesia Post Note  Patient: Gregory Alvarado  Procedure(s) Performed: ENTEROSCOPY HOT HEMOSTASIS (ARGON PLASMA COAGULATION/BICAP)  Patient location during evaluation: PACU Anesthesia Type: General Level of consciousness: awake and alert Pain management: pain level controlled Vital Signs Assessment: post-procedure vital signs reviewed and stable Respiratory status: spontaneous breathing, nonlabored ventilation, respiratory function stable and patient connected to nasal cannula oxygen Cardiovascular status: blood pressure returned to baseline and stable Postop Assessment: no apparent nausea or vomiting Anesthetic complications: no   No notable events documented.   Last Vitals:  Vitals:   09/30/23 0912 09/30/23 1451  BP: (!) 134/47 (!) 103/49  Pulse: 84 85  Resp: 18 18  Temp:  37.1 C  SpO2: 100% 98%    Last Pain:  Vitals:   09/30/23 1451  TempSrc: Oral  PainSc:                  Gaetano Hawthorne

## 2023-10-04 ENCOUNTER — Encounter (HOSPITAL_COMMUNITY): Payer: Self-pay | Admitting: Hematology

## 2023-10-07 DIAGNOSIS — J449 Chronic obstructive pulmonary disease, unspecified: Secondary | ICD-10-CM | POA: Diagnosis not present

## 2023-10-07 NOTE — Telephone Encounter (Signed)
LMOM for pt to call office  

## 2023-10-11 ENCOUNTER — Other Ambulatory Visit: Payer: Self-pay | Admitting: Gastroenterology

## 2023-10-11 DIAGNOSIS — D500 Iron deficiency anemia secondary to blood loss (chronic): Secondary | ICD-10-CM

## 2023-10-11 DIAGNOSIS — K5520 Angiodysplasia of colon without hemorrhage: Secondary | ICD-10-CM

## 2023-10-11 MED ORDER — OCTREOTIDE ACETATE 100 MCG/ML IJ SOLN: 100.0000 ug | Freq: Two times a day (BID) | INTRAMUSCULAR | 3 refills | Status: DC

## 2023-10-11 NOTE — Telephone Encounter (Signed)
You need to send in a new prescription for solution.

## 2023-10-11 NOTE — Telephone Encounter (Signed)
Ok. Please let me know if we just need to send in a different Rx for the solution that the patient would drawn up himself rather than pre-filled.

## 2023-10-11 NOTE — Progress Notes (Unsigned)
Referring Provider: Benita Stabile, MD Primary Care Physician:  Benita Stabile, MD Primary GI Physician: Dr. Marletta Lor  No chief complaint on file.   HPI:   Gregory Alvarado is a 73 y.o. male with a history of chronic systolic and diastolic CHF, CAD with DES (12/21), type 2 diabetes mellitus, hyperlipidemia, COPD, chronic respiratory failure on supplemental oxygen at 2-3 liters as outpatient, GI bleed, , recurrent AVMs, recently hospitalized earlier this month after presenting with worsening chest pain, DOE, and admitted with acute on chronic CHF. GI consulted for chronic melena and worsening anemia.   His admitting hemoglobin was 6.2.  He received 2 units PRBCs.  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  Recommended Protonix twice daily and octreotide 100 mg SQ twice daily.  Hemoglobin stabilized in the 7 range, 7.5 on day of discharge (09/30/2023).   Today:        Past Medical History:  Diagnosis Date   Anemia    Arthritis    left shoulder   COPD (chronic obstructive pulmonary disease) (HCC)    Coronary artery disease 09/11/2020   NSTEMI and 3 stents in [facility]   Diabetes mellitus without complication (HCC)    Dyslipidemia    Hypertension    Insomnia    Insomnia    Morbid obesity (HCC)    Tobacco use     Past Surgical History:  Procedure Laterality Date   BIOPSY  10/28/2019   Procedure: BIOPSY;  Surgeon: West Bali, MD;  [facility];  Service: Endoscopy;;  gastric   BIOPSY  04/15/2020   Procedure: BIOPSY;  Surgeon: Corbin Ade, MD;  [facility];  Service: Endoscopy;;  gastric   CARDIAC CATHETERIZATION  08/2020   3 stents placed   COLON RESECTION  1990s?   DIVERTICULITIS   COLONOSCOPY WITH PROPOFOL N/A 04/15/2020   Rourk: Poor colon prep. two 6 to 8 mm polyps removed from the hepatic flexure, tubular  adenomas.  Repeat colonoscopy later this year.   ENTEROSCOPY N/A 11/26/2021   Procedure: ENTEROSCOPY;  Surgeon: Dolores Frame, MD;  [facility];  Service: Gastroenterology;  Laterality: N/A;   ENTEROSCOPY N/A 03/13/2022   Procedure: ENTEROSCOPY;  Surgeon: Lanelle Bal, DO;  [facility];  Service: Endoscopy;  Laterality: N/A;   ENTEROSCOPY N/A 09/28/2023   Procedure: ENTEROSCOPY;  Surgeon: Dolores Frame, MD;  [facility];  Service: Gastroenterology;  Laterality: N/A;   ESOPHAGOGASTRODUODENOSCOPY (EGD) WITH PROPOFOL N/A 10/28/2019   Dr. Darrick Penna: Multiple cratered gastric ulcers, gastritis/duodenitis.  Path showed H. pylori.  Patient treated with amoxicillin/Biaxin/PPI twice daily.   ESOPHAGOGASTRODUODENOSCOPY (EGD) WITH PROPOFOL N/A 04/15/2020   Rourk: Patchy gastric erythema status post biopsy to document eradication of H. pylori.  Biopsy showed persistent H. pylori.  Previously noted gastric ulcer is completely healed.   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   ESOPHAGOGASTRODUODENOSCOPY (EGD) WITH PROPOFOL N/A 03/13/2022   Procedure: ESOPHAGOGASTRODUODENOSCOPY (EGD) WITH PROPOFOL;  Surgeon: Lanelle Bal, DO;  [facility];  Service: Endoscopy;  Laterality: N/A;   HERNIA REPAIR     UHR   HOT HEMOSTASIS  11/26/2021   Procedure: HOT HEMOSTASIS (ARGON PLASMA COAGULATION/BICAP);  Surgeon: Marguerita Merles, Reuel Boom, MD;  [facility];  Service: Gastroenterology;;   HOT HEMOSTASIS  09/28/2023   Procedure: HOT HEMOSTASIS (ARGON PLASMA COAGULATION/BICAP);  Surgeon: Marguerita Merles, Reuel Boom, MD;  [facility];  Service: Gastroenterology;;   Elease Hashimoto DILATION N/A 04/15/2020   Procedure: Alvy Beal;  Surgeon: Corbin Ade, MD;  [facility];  Service: Endoscopy;  Laterality: N/A;    POLYPECTOMY  04/15/2020   Procedure: POLYPECTOMY;  Surgeon: Corbin Ade, MD;  [facility];  Service: Endoscopy;;  colon   ROTATOR CUFF REPAIR Bilateral    TYMPANOPLASTY      Current Outpatient Medications  Medication Sig Dispense Refill   acetaminophen (TYLENOL) 500 MG tablet Take 1,000 mg by mouth every 6 (six) hours as needed for mild pain.     albuterol (VENTOLIN HFA) 108 (90 Base) MCG/ACT inhaler Inhale 2 puffs into the lungs every 4 (four) hours as needed for wheezing or shortness of breath.     BREZTRI AEROSPHERE 160-9-4.8 MCG/ACT AERO Inhale 2 puffs into the lungs 2 (two) times daily.     cyanocobalamin (,VITAMIN B-12,) 1000 MCG/ML injection Inject 1,000 mcg into the muscle every 30 (thirty) days.     diphenhydramine-acetaminophen (TYLENOL PM) 25-500 MG TABS tablet Take 1 tablet by mouth at bedtime as needed (sleep).     empagliflozin (JARDIANCE) 10 MG TABS tablet Take 1 tablet (10 mg total) by mouth daily. 30 tablet 0   insulin NPH-regular Human (NOVOLIN 70/30) (70-30) 100 UNIT/ML injection Inject 40 Units into the skin in the morning and at bedtime.     levocetirizine (XYZAL) 5 MG tablet Take 5 mg by mouth at bedtime.     metoprolol succinate (TOPROL-XL) 25 MG 24 hr tablet Take 1 tablet (25 mg total) by mouth 2 (two) times daily. 60 tablet 1   nitroGLYCERIN (NITROSTAT) 0.4 MG SL tablet DISSOLVE 1 TABLET SUBLINGUALLY AS NEEDED FOR CHEST PAIN, MAY REPEAT EVERY 5 MINUTES. AFTER 3 CALL 911. 25 tablet 3   Octreotide Acetate 100 MCG/ML SOSY Inject 1 mL (100 mcg total) into the skin in the morning and at bedtime. 60 mL 3   pantoprazole (PROTONIX) 40 MG tablet Take 1 tablet (40 mg total) by mouth 2 (two) times daily. 60 tablet 2   rosuvastatin (CRESTOR) 40 MG tablet Take 1 tablet (40 mg total) by mouth daily. 90 tablet 3   spironolactone (ALDACTONE) 25 MG tablet Take 0.5 tablets (12.5 mg total) by mouth daily. 30 tablet 1   traMADol (ULTRAM) 50 MG tablet Take 50-100 mg by mouth  in the morning and at bedtime.     zolpidem (AMBIEN) 5 MG tablet Take 5 mg by mouth at bedtime as needed for sleep.     No current facility-administered medications for this visit.    Allergies as of 10/13/2023 - Review Complete 10/01/2023  Allergen Reaction Noted   Lisinopril Swelling 07/26/2022   Caduet [amlodipine-atorvastatin] Swelling 09/18/2015    Family History  Problem Relation Age of Onset   Diabetes Mother    Heart attack Mother    Heart attack Father    Heart attack Brother    Colon cancer Neg Hx    Stomach cancer Neg Hx     Social History   Socioeconomic History   Marital status: Divorced    Spouse name: Not on file   Number of children: 2   Years of education: Not on file   Highest education level: High school graduate  Occupational History   Occupation: Retired  Tobacco Use  Smoking status: Every Day    Current packs/day: 2.00    Types: Cigarettes   Smokeless tobacco: Never  Vaping Use   Vaping status: Never Used  Substance and Sexual Activity   Alcohol use: Not Currently    Comment: very rare   Drug use: No   Sexual activity: Not on file  Other Topics Concern   Not on file  Social History Narrative   Not on file   Social Drivers of Health   Financial Resource Strain: Low Risk  (08/06/2020)   Overall Financial Resource Strain (CARDIA)    Difficulty of Paying Living Expenses: Not very hard  Food Insecurity: No Food Insecurity (10/01/2023)   Hunger Vital Sign    Worried About Running Out of Food in the Last Year: Never true    Ran Out of Food in the Last Year: Never true  Transportation Needs: No Transportation Needs (10/01/2023)   PRAPARE - Administrator, Civil Service (Medical): No    Lack of Transportation (Non-Medical): No  Physical Activity: Inactive (08/06/2020)   Exercise Vital Sign    Days of Exercise per Week: 0 days    Minutes of Exercise per Session: 0 min  Stress: No Stress Concern Present (08/06/2020)   [employer] of Occupational Health - Occupational Stress Questionnaire    Feeling of Stress : Not at all  Social Connections: Socially Isolated (10/01/2023)   Social Connection and Isolation Panel [NHANES]    Frequency of Communication with Friends and Family: More than three times a week    Frequency of Social Gatherings with Friends and Family: More than three times a week    Attends Religious Services: Never    Database administrator or Organizations: No    Attends Banker Meetings: Never    Marital Status: Divorced    Review of Systems: Gen: Denies fever, chills, anorexia. Denies fatigue, weakness, weight loss.  CV: Denies chest pain, palpitations, syncope, peripheral edema, and claudication. Resp: Denies dyspnea at rest, cough, wheezing, coughing up blood, and pleurisy. GI: Denies vomiting blood, jaundice, and fecal incontinence.   Denies dysphagia or odynophagia. Derm: Denies rash, itching, dry skin Psych: Denies depression, anxiety, memory loss, confusion. No homicidal or suicidal ideation.  Heme: Denies bruising, bleeding, and enlarged lymph nodes.  Physical Exam: There were no vitals taken for this visit. General:   Alert and oriented. No distress noted. Pleasant and cooperative.  Head:  Normocephalic and atraumatic. Eyes:  Conjuctiva clear without scleral icterus. Heart:  S1, S2 present without murmurs appreciated. Lungs:  Clear to auscultation bilaterally. No wheezes, rales, or rhonchi. No distress.  Abdomen:  +BS, soft, non-tender and non-distended. No rebound or guarding. No HSM or masses noted. Msk:  Symmetrical without gross deformities. Normal posture. Extremities:  Without edema. Neurologic:  Alert and  oriented x4 Psych:  Normal mood and affect.    Assessment:     Plan:  ***   Ermalinda Memos, PA-C [facility] 10/13/2023

## 2023-10-11 NOTE — Telephone Encounter (Signed)
Noted

## 2023-10-11 NOTE — Telephone Encounter (Signed)
Received a denial letter for Octreoitde for the pre-filled pens. Sent Todd at [employer] an email to inform him that the solution is on his formulary.

## 2023-10-11 NOTE — Telephone Encounter (Signed)
Rx sent

## 2023-10-13 ENCOUNTER — Ambulatory Visit (INDEPENDENT_AMBULATORY_CARE_PROVIDER_SITE_OTHER): Payer: PPO | Admitting: Gastroenterology

## 2023-10-13 ENCOUNTER — Encounter: Payer: Self-pay | Admitting: Gastroenterology

## 2023-10-13 VITALS — BP 107/54 | HR 116 | Temp 97.6°F | Ht 72.0 in | Wt 285.2 lb

## 2023-10-13 DIAGNOSIS — D500 Iron deficiency anemia secondary to blood loss (chronic): Secondary | ICD-10-CM

## 2023-10-13 DIAGNOSIS — K5909 Other constipation: Secondary | ICD-10-CM | POA: Diagnosis not present

## 2023-10-13 DIAGNOSIS — K31819 Angiodysplasia of stomach and duodenum without bleeding: Secondary | ICD-10-CM

## 2023-10-13 DIAGNOSIS — D509 Iron deficiency anemia, unspecified: Secondary | ICD-10-CM

## 2023-10-13 DIAGNOSIS — K5900 Constipation, unspecified: Secondary | ICD-10-CM

## 2023-10-13 DIAGNOSIS — K5520 Angiodysplasia of colon without hemorrhage: Secondary | ICD-10-CM

## 2023-10-13 MED ORDER — FERROUS SULFATE 325 (65 FE) MG PO TABS: 325.0000 mg | ORAL_TABLET | Freq: Every day | ORAL | 5 refills | Status: DC

## 2023-10-13 NOTE — Patient Instructions (Signed)
Please have labs completed at Dr. Scharlene Gloss office.  You will need to ask that they are faxed to our office.  Please call the specialty pharmacy back to see how much octreotide is going to cost.  If it is not affordable, please let me know.  This medication is to help limit bleeding from small bowel AVMs that is the cause of your chronic, recurrent anemia.  You will need to inject 1 mL (100 mcg total) twice daily.  Continue pantoprazole 40 mg twice daily.  You need to start iron by mouth every day.  I have sent a prescription to your pharmacy.  For constipation, start MiraLAX 1 capful (17 g) daily in 8 ounces of water or other noncarbonated beverage of your choice. If MiraLAX does not control your constipation, please let me know.  I will plan to see you back in 3 months or sooner if needed.  It was good to see you today!  Ermalinda Memos, PA-C [facility]

## 2023-10-15 DIAGNOSIS — D500 Iron deficiency anemia secondary to blood loss (chronic): Secondary | ICD-10-CM | POA: Diagnosis not present

## 2023-10-26 ENCOUNTER — Telehealth: Payer: Self-pay | Admitting: *Deleted

## 2023-10-26 NOTE — Telephone Encounter (Signed)
Pt called and wanted to know what his lab results were from 2 weeks ago. I informed him that I did not have results from 2 weeks ago. I informed him that provider was out of the office but I would leave a message and would get back to him as soon as possible. He started to yell at me and say don't worry about it, because someone should have called him 2 weeks ago with results. He informed me about not be approved for octreotide. I informed him that I knew about it and I was going to appeal it. He stated no that he was not going to pay 1700$ for that medication. He yelled  not to worry about it either. I asked him didn't he want to get better. He told me he would never get better and not to worry about it and hung up the phone.

## 2023-10-26 NOTE — Telephone Encounter (Signed)
Received a denial letter octreotide. You can do an appeal. I have place the letter on your desk. A copy has been scanned in the chart.

## 2023-10-26 NOTE — Telephone Encounter (Signed)
Spoke to pt, informed him of results and recommendations. Pt voiced understanding. Informed to go to ED if worsening of shortness of breath, lightheadness, rectal bleeding.

## 2023-10-29 ENCOUNTER — Ambulatory Visit: Payer: PPO | Attending: Cardiology | Admitting: Cardiology

## 2023-10-29 ENCOUNTER — Encounter: Payer: Self-pay | Admitting: Cardiology

## 2023-10-29 VITALS — BP 115/55 | HR 88 | Ht 72.0 in | Wt 277.0 lb

## 2023-10-29 DIAGNOSIS — I5032 Chronic diastolic (congestive) heart failure: Secondary | ICD-10-CM | POA: Diagnosis not present

## 2023-10-29 DIAGNOSIS — I3139 Other pericardial effusion (noninflammatory): Secondary | ICD-10-CM

## 2023-10-29 DIAGNOSIS — I2510 Atherosclerotic heart disease of native coronary artery without angina pectoris: Secondary | ICD-10-CM | POA: Diagnosis not present

## 2023-10-29 NOTE — Patient Instructions (Addendum)
Medication Instructions:  Your physician recommends that you continue on your current medications as directed. Please refer to the Current Medication list given to you today.   Labwork: None today  Testing/Procedures: Your physician has requested that you have an LIMITED echocardiogram in 4 weeks. Echocardiography is a painless test that uses sound waves to create images of your heart. It provides your doctor with information about the size and shape of your heart and how well your heart's chambers and valves are working. This procedure takes approximately one hour. There are no restrictions for this procedure. Please do NOT wear cologne, perfume, aftershave, or lotions (deodorant is allowed). Please arrive 15 minutes prior to your appointment time.  Please note: We ask at that you not bring children with you during ultrasound (echo/ vascular) testing. Due to room size and safety concerns, children are not allowed in the ultrasound rooms during exams. Our front office staff cannot provide observation of children in our lobby area while testing is being conducted. An adult accompanying a patient to their appointment will only be allowed in the ultrasound room at the discretion of the ultrasound technician under special circumstances. We apologize for any inconvenience.   Follow-Up: 6 months  Any Other Special Instructions Will Be Listed Below (If Applicable).  If you need a refill on your cardiac medications before your next appointment, please call your pharmacy.

## 2023-10-29 NOTE — Progress Notes (Signed)
Clinical Summary Gregory Alvarado is a 73 y.o.male seen today for a focused visit for history of CAD and chronic sysotlic HF     1. CAD/ICM/Chronic systolic HF - from notes initially chest pain and dyspnea, became unresponsive en route to ED - intubated in ER. Notes mention issues with COPD exacerbation vs aspiration event, no mention of a cardiac arrest - diagnosed with NSTEMI during admission, had cath and 3 stents. - admitted 12/29 to Jan 9   09/12/20 cath: LM LIs, LAD prox 70% and 90% mid, LCX 80% prox with thrombus, OM2 85%, RCA small non dom 60%. LVEDP elevated but number not given, - received DES to prox/mid LAD, DES to prox LCX, DES to OM2. 08/2020 echo: LVEF 35-40% grade II DDx, mild MR  12/2020 echo: LVEF 60-65%, no WMAs, indet diastolic, normal RV   07/2022 LVEF 40-45% - this was in setting of post respiratory arrest.  - angioedema and respiratory failure on ACEi   10/2022 echo LVEF 50-55%, apex akinetic - no chest pain, chronic stable SOB.  - compliant with meds. Muscle cramps on lasix stopped taking, will take just prn.   -admit Jan 2025 with volume overload - diuresed over 10 liters, discharge weight 272 lbs - Jan 2025 echo: LVEF 50-55%, indet diastolic, normal RV, severe LAE, mod pericardial effusion - not checking weights. - swelling is up and down, no SOB/DOE - compliant with meds. Muscle cramps on lasix, appears from hospital d/c just on aldactone.      2. COPD - severe COPD exacerbation during jan 2022 admission in [place], [place] requiring intubation - on home O2 2 L     3. LE edema - cramps on both daily lasix and torsemide - no recurrent edema since recent discharge       4. 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 Hgb at 7.5 today.       5. Respiratory failure - admit 07/2022, respiratory driven cardiac arrest in setting of COPD and ACEi angioedema, required intubation -      6. Hyperlipidemia - 07/2022 TC 197 TG 394 HDL 28 LDL 102 - 02/2023 TC 89 TG 914 HDL 39 LDL 29  7. Pericardial effusion - moderate by Jan 2025 echo, no evidence of tamponade - needs repeat limited study Past Medical History:  Diagnosis Date   Anemia    Arthritis    left shoulder   COPD (chronic obstructive pulmonary disease) (HCC)    Coronary artery disease 09/11/2020   NSTEMI and 3 stents in [facility]   Diabetes mellitus without complication (HCC)    Dyslipidemia    Hypertension    Insomnia    Insomnia    Morbid obesity (HCC)    Tobacco use      Allergies  Allergen Reactions   Lisinopril Swelling    ACEi angioedema requiring intubation 07/2022   Caduet [Amlodipine-Atorvastatin] Swelling     Current Outpatient Medications  Medication Sig Dispense Refill   acetaminophen (TYLENOL) 500 MG tablet Take 1,000 mg by mouth every 6 (six) hours as  needed for mild pain.     albuterol (VENTOLIN HFA) 108 (90 Base) MCG/ACT inhaler Inhale 2 puffs into the lungs every 4 (four) hours as needed for wheezing or shortness of breath.     BREZTRI AEROSPHERE 160-9-4.8 MCG/ACT AERO Inhale 2 puffs into the lungs 2 (two) times daily.     cyanocobalamin (,VITAMIN B-12,) 1000 MCG/ML injection Inject 1,000 mcg into the muscle every 30 (thirty) days.     diphenhydramine-acetaminophen (TYLENOL PM) 25-500 MG TABS tablet Take 1 tablet by mouth at bedtime as needed (sleep).     empagliflozin (JARDIANCE) 10 MG TABS tablet Take 1 tablet (10 mg total) by mouth daily. 30 tablet 0   ferrous sulfate 325 (65 FE) MG tablet Take 1 tablet (325 mg  total) by mouth daily with breakfast. 30 tablet 5   insulin NPH-regular Human (NOVOLIN 70/30) (70-30) 100 UNIT/ML injection Inject 40 Units into the skin in the morning and at bedtime.     levocetirizine (XYZAL) 5 MG tablet Take 5 mg by mouth at bedtime.     metoprolol succinate (TOPROL-XL) 25 MG 24 hr tablet Take 1 tablet (25 mg total) by mouth 2 (two) times daily. 60 tablet 1   nitroGLYCERIN (NITROSTAT) 0.4 MG SL tablet DISSOLVE 1 TABLET SUBLINGUALLY AS NEEDED FOR CHEST PAIN, MAY REPEAT EVERY 5 MINUTES. AFTER 3 CALL 911. 25 tablet 3   octreotide (SANDOSTATIN) 100 MCG/ML SOLN injection Inject 1 mL (100 mcg total) into the skin every 12 (twelve) hours. 60 mL 3   pantoprazole (PROTONIX) 40 MG tablet Take 1 tablet (40 mg total) by mouth 2 (two) times daily. 60 tablet 2   rosuvastatin (CRESTOR) 40 MG tablet Take 1 tablet (40 mg total) by mouth daily. 90 tablet 3   spironolactone (ALDACTONE) 25 MG tablet Take 0.5 tablets (12.5 mg total) by mouth daily. 30 tablet 1   traMADol (ULTRAM) 50 MG tablet Take 50-100 mg by mouth in the morning and at bedtime.     zolpidem (AMBIEN) 5 MG tablet Take 5 mg by mouth at bedtime as needed for sleep.     No current facility-administered medications for this visit.     Past Surgical History:  Procedure Laterality Date   BIOPSY  10/28/2019   Procedure: BIOPSY;  Surgeon: West Bali, MD;  [facility];  Service: Endoscopy;;  gastric   BIOPSY  04/15/2020   Procedure: BIOPSY;  Surgeon: Corbin Ade, MD;  [facility]: [facility];  Service: Endoscopy;;  gastric   CARDIAC CATHETERIZATION  08/2020   3 stents placed   COLON RESECTION  1990s?   DIVERTICULITIS   COLONOSCOPY WITH PROPOFOL N/A 04/15/2020   Rourk: Poor colon prep. two 6 to 8 mm polyps removed from the hepatic flexure, tubular adenomas.  Repeat colonoscopy later this year.   ENTEROSCOPY N/A 11/26/2021   Procedure: ENTEROSCOPY;  Surgeon: Dolores Frame, MD;  [facility]: [facility];   Service: Gastroenterology;  Laterality: N/A;   ENTEROSCOPY N/A 03/13/2022   Procedure: ENTEROSCOPY;  Surgeon: Lanelle Bal, DO;  [facility]: [facility];  Service: Endoscopy;  Laterality: N/A;   ENTEROSCOPY N/A 09/28/2023   Procedure: ENTEROSCOPY;  Surgeon: Dolores Frame, MD;  [facility];  Service: Gastroenterology;  Laterality: N/A;   ESOPHAGOGASTRODUODENOSCOPY (EGD) WITH PROPOFOL N/A 10/28/2019   Dr. Darrick Penna: Multiple cratered gastric ulcers, gastritis/duodenitis.  Path showed H. pylori.  Patient treated with amoxicillin/Biaxin/PPI twice daily.   ESOPHAGOGASTRODUODENOSCOPY (EGD) WITH PROPOFOL N/A 04/15/2020   Rourk:  Patchy gastric erythema status post biopsy to document eradication of H. pylori.  Biopsy showed persistent H. pylori.  Previously noted gastric ulcer is completely healed.   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   ESOPHAGOGASTRODUODENOSCOPY (EGD) WITH PROPOFOL N/A 03/13/2022   Procedure: ESOPHAGOGASTRODUODENOSCOPY (EGD) WITH PROPOFOL;  Surgeon: Lanelle Bal, DO;  [facility];  Service: Endoscopy;  Laterality: N/A;   HERNIA REPAIR     UHR   HOT HEMOSTASIS  11/26/2021   Procedure: HOT HEMOSTASIS (ARGON PLASMA COAGULATION/BICAP);  Surgeon: Marguerita Merles, Reuel Boom, MD;  [facility]: [facility];  Service: Gastroenterology;;   HOT HEMOSTASIS  09/28/2023   Procedure: HOT HEMOSTASIS (ARGON PLASMA COAGULATION/BICAP);  Surgeon: Marguerita Merles, Reuel Boom, MD;  [facility];  Service: Gastroenterology;;   Elease Hashimoto DILATION N/A 04/15/2020   Procedure: Alvy Beal;  Surgeon: Corbin Ade, MD;  [facility];  Service: Endoscopy;  Laterality: N/A;   POLYPECTOMY  04/15/2020   Procedure: POLYPECTOMY;  Surgeon: Corbin Ade, MD;  [facility];  Service: Endoscopy;;  colon   ROTATOR CUFF REPAIR Bilateral     TYMPANOPLASTY       Allergies  Allergen Reactions   Lisinopril Swelling    ACEi angioedema requiring intubation 07/2022   Caduet [Amlodipine-Atorvastatin] Swelling      Family History  Problem Relation Age of Onset   Diabetes Mother    Heart attack Mother    Heart attack Father    Heart attack Brother    Colon cancer Neg Hx    Stomach cancer Neg Hx      Social History Gregory Alvarado reports that he has been smoking cigarettes. He has never used smokeless tobacco. Gregory Alvarado reports that he does not currently use alcohol.     Physical Examination Today's Vitals   10/29/23 1608 10/29/23 1643  BP: (!) 98/48 (!) 115/55  Pulse: 88   SpO2: 95%   Weight: 277 lb (125.6 kg)   Height: 6' (1.829 m)    Body mass index is 37.57 kg/m.  Gen: resting comfortably, no acute distress HEENT: no scleral icterus, pupils equal round and reactive, no palptable cervical adenopathy,  CV: RRR, no m/rg, no jvd Resp: Clear to auscultation bilaterally GI: abdomen is soft, non-tender, non-distended, normal bowel sounds, no hepatosplenomegaly MSK: extremities are warm, no edema.  Skin: warm, no rash Neuro:  no focal deficits Psych: appropriate affect   Diagnostic Studies  08/2018 echo Study Conclusions   - Left ventricle: The cavity size was normal. Wall thickness was   increased increased in a pattern of mild to moderate LVH.   Systolic function was mildly to moderately reduced. The estimated   ejection fraction was in the range of 40% to 45%. Diffuse   hypokinesis. Doppler parameters are consistent with abnormal left   ventricular relaxation (grade 1 diastolic dysfunction). Doppler   parameters are consistent with high ventricular filling pressure. - Aortic valve: Valve area (VTI): 3.22 cm^2. Valve area (Vmax):   2.74 cm^2. Valve area (Vmean): 2.29 cm^2. - Left atrium: The atrium was moderately dilated. - Atrial septum: No defect or patent foramen ovale was identified. - Techncically  difficult study, recommend limited study with   echocontrast to better clarify LVEF. Based on available images   appears mildly decreased.   08/2018 contrast echo Study Conclusions   - Left ventricle: Limited study with Definity contrast. Systolic  function was normal. The estimated ejection fraction was in the   range of 55% to 60%. Wall motion was normal; there were no   regional wall motion abnormalities. - Mitral valve: There was trivial regurgitation. - Pericardium, extracardiac: A prominent pericardial fat pad was   present.   07/2020 echo IMPRESSIONS     1. Left ventricular ejection fraction, by estimation, is 55 to 60%. The  left ventricle has normal function. The left ventricle has no regional  wall motion abnormalities. There is moderate left ventricular hypertrophy.  Left ventricular diastolic  parameters are indeterminate.   2. Right ventricular systolic function is normal. The right ventricular  size is normal.   3. The mitral valve is normal in structure. Mild mitral valve  regurgitation. No evidence of mitral stenosis.   4. The aortic valve is tricuspid. There is mild calcification of the  aortic valve. There is mild thickening of the aortic valve. Aortic valve  regurgitation is not visualized. No aortic stenosis is present.   5. The inferior vena cava is normal in size with greater than 50%  respiratory variability, suggesting right atrial pressure of 3 mmHg.     12/2020 echo IMPRESSIONS     1. Left ventricular ejection fraction, by estimation, is 60 to 65%. The  left ventricle has normal function. The left ventricle has no regional  wall motion abnormalities. There is mild left ventricular hypertrophy.  Left ventricular diastolic parameters  are indeterminate. Elevated left atrial pressure.   2. Right ventricular systolic function is normal. The right ventricular  size is normal.   3. Left atrial size was severely dilated.   4. Right atrial size was  mildly dilated.   5. The mitral valve is normal in structure. Mild mitral valve  regurgitation. No evidence of mitral stenosis.   6. The aortic valve is tricuspid. Aortic valve regurgitation is not  visualized. No aortic stenosis is present.   7. The inferior vena cava is dilated in size with >50% respiratory  variability, suggesting right atrial pressure of 8 mmHg.    10/2022 echo 1. Apex is akinetic. Marland Kitchen Left ventricular ejection fraction, by estimation,  is 50 to 55%. The left ventricle has low normal function. The left  ventricle demonstrates regional wall motion abnormalities (see scoring  diagram/findings for description). There  is severe left ventricular hypertrophy. Left ventricular diastolic  parameters are consistent with Grade I diastolic dysfunction (impaired  relaxation). Elevated left atrial pressure.   2. Right ventricular systolic function is normal. The right ventricular  size is normal.   3. Left atrial size was severely dilated.   4. The mitral valve is normal in structure. Mild mitral valve  regurgitation. No evidence of mitral stenosis.   5. The aortic valve is tricuspid. There is mild calcification of the  aortic valve. There is mild thickening of the aortic valve. Aortic valve  regurgitation is not visualized. No aortic stenosis is present.   6. The inferior vena cava is dilated in size with >50% respiratory  variability, suggesting right atrial pressure of 8 mmHg.   Assessment and Plan   1. CAD/ICM/HFimpER - LVEF has improved, back in normal range - severe ACEi allergy with angioedema with intubation, not a candidate for ACE or ARNI. Though low crossreactivity I would be hesitant for ARB as well  - not on antiplatelet due to multiple admits with severe symptomatic anemia and bleeding GI AVMs.  - muscle cramps on loop diuretics, he has not wanted to take.  Just on aldactone since recent discharge last month and appears euvolemic, continue current meds    2.Pericardial effusion - repeat limited echo 1 month     [name], M.D.

## 2023-11-02 NOTE — Telephone Encounter (Signed)
 Lmom for pt to return call

## 2023-11-02 NOTE — Telephone Encounter (Signed)
Reviewed. Labs completed on 1/31 with PCP. Hgb down to 7.1 from 7.5 on 1/16. Recommend we go ahead and recheck his CBC now. Please arrange. Dx: IDA.   I will have to work on appeal for octreotide when I return to the office.

## 2023-11-03 ENCOUNTER — Other Ambulatory Visit: Payer: Self-pay

## 2023-11-03 DIAGNOSIS — D500 Iron deficiency anemia secondary to blood loss (chronic): Secondary | ICD-10-CM

## 2023-11-03 NOTE — Telephone Encounter (Signed)
Pt was made aware, lab was ordered and pt was instructed to have completed as soon as possible. Pt verbalized understanding.

## 2023-11-08 DIAGNOSIS — D509 Iron deficiency anemia, unspecified: Secondary | ICD-10-CM | POA: Diagnosis not present

## 2023-11-08 DIAGNOSIS — E538 Deficiency of other specified B group vitamins: Secondary | ICD-10-CM | POA: Diagnosis not present

## 2023-11-08 DIAGNOSIS — E1169 Type 2 diabetes mellitus with other specified complication: Secondary | ICD-10-CM | POA: Diagnosis not present

## 2023-11-08 DIAGNOSIS — E782 Mixed hyperlipidemia: Secondary | ICD-10-CM | POA: Diagnosis not present

## 2023-11-09 ENCOUNTER — Encounter (HOSPITAL_COMMUNITY): Payer: Self-pay | Admitting: Emergency Medicine

## 2023-11-09 ENCOUNTER — Observation Stay (HOSPITAL_COMMUNITY)
Admission: EM | Admit: 2023-11-09 | Discharge: 2023-11-11 | Disposition: A | Discharge status: Home / Self Care | Payer: PPO | Attending: Family Medicine | Admitting: Family Medicine

## 2023-11-09 ENCOUNTER — Emergency Department (HOSPITAL_COMMUNITY): Payer: PPO

## 2023-11-09 ENCOUNTER — Other Ambulatory Visit: Payer: Self-pay

## 2023-11-09 DIAGNOSIS — Z7984 Long term (current) use of oral hypoglycemic drugs: Secondary | ICD-10-CM | POA: Diagnosis not present

## 2023-11-09 DIAGNOSIS — K922 Gastrointestinal hemorrhage, unspecified: Secondary | ICD-10-CM | POA: Diagnosis not present

## 2023-11-09 DIAGNOSIS — J449 Chronic obstructive pulmonary disease, unspecified: Secondary | ICD-10-CM | POA: Insufficient documentation

## 2023-11-09 DIAGNOSIS — R7989 Other specified abnormal findings of blood chemistry: Secondary | ICD-10-CM | POA: Diagnosis present

## 2023-11-09 DIAGNOSIS — I5042 Chronic combined systolic (congestive) and diastolic (congestive) heart failure: Secondary | ICD-10-CM | POA: Insufficient documentation

## 2023-11-09 DIAGNOSIS — Z79899 Other long term (current) drug therapy: Secondary | ICD-10-CM | POA: Diagnosis not present

## 2023-11-09 DIAGNOSIS — Z794 Long term (current) use of insulin: Secondary | ICD-10-CM | POA: Insufficient documentation

## 2023-11-09 DIAGNOSIS — E119 Type 2 diabetes mellitus without complications: Secondary | ICD-10-CM | POA: Insufficient documentation

## 2023-11-09 DIAGNOSIS — D649 Anemia, unspecified: Secondary | ICD-10-CM | POA: Diagnosis not present

## 2023-11-09 DIAGNOSIS — J9611 Chronic respiratory failure with hypoxia: Secondary | ICD-10-CM | POA: Diagnosis not present

## 2023-11-09 DIAGNOSIS — Z6837 Body mass index (BMI) 37.0-37.9, adult: Secondary | ICD-10-CM | POA: Insufficient documentation

## 2023-11-09 DIAGNOSIS — I110 Hypertensive heart disease with heart failure: Secondary | ICD-10-CM | POA: Insufficient documentation

## 2023-11-09 DIAGNOSIS — F17210 Nicotine dependence, cigarettes, uncomplicated: Secondary | ICD-10-CM | POA: Diagnosis not present

## 2023-11-09 DIAGNOSIS — E66812 Obesity, class 2: Secondary | ICD-10-CM | POA: Diagnosis not present

## 2023-11-09 DIAGNOSIS — Z720 Tobacco use: Secondary | ICD-10-CM | POA: Diagnosis present

## 2023-11-09 DIAGNOSIS — I2510 Atherosclerotic heart disease of native coronary artery without angina pectoris: Secondary | ICD-10-CM | POA: Diagnosis not present

## 2023-11-09 LAB — CBC
HCT: 20.1 % — ABNORMAL LOW (ref 39.0–52.0)
Hemoglobin: 5.2 g/dL — CL (ref 13.0–17.0)
MCH: 20.5 pg — ABNORMAL LOW (ref 26.0–34.0)
MCHC: 25.9 g/dL — ABNORMAL LOW (ref 30.0–36.0)
MCV: 79.1 fL — ABNORMAL LOW (ref 80.0–100.0)
Platelets: 174 10*3/uL (ref 150–400)
RBC: 2.54 MIL/uL — ABNORMAL LOW (ref 4.22–5.81)
RDW: 19.2 % — ABNORMAL HIGH (ref 11.5–15.5)
WBC: 10.2 10*3/uL (ref 4.0–10.5)
nRBC: 0 % (ref 0.0–0.2)

## 2023-11-09 LAB — COMPREHENSIVE METABOLIC PANEL
ALT: 8 U/L (ref 0–44)
AST: 11 U/L — ABNORMAL LOW (ref 15–41)
Albumin: 3.3 g/dL — ABNORMAL LOW (ref 3.5–5.0)
Alkaline Phosphatase: 63 U/L (ref 38–126)
Anion gap: 10 (ref 5–15)
BUN: 20 mg/dL (ref 8–23)
CO2: 26 mmol/L (ref 22–32)
Calcium: 9.1 mg/dL (ref 8.9–10.3)
Chloride: 100 mmol/L (ref 98–111)
Creatinine, Ser: 1.15 mg/dL (ref 0.61–1.24)
GFR, Estimated: >60 mL/min (ref >60)
Glucose, Bld: 220 mg/dL — ABNORMAL HIGH (ref 70–99)
Potassium: 4.5 mmol/L (ref 3.5–5.1)
Sodium: 136 mmol/L (ref 135–145)
Total Bilirubin: 0.5 mg/dL (ref 0.0–1.2)
Total Protein: 6.7 g/dL (ref 6.5–8.1)

## 2023-11-09 MED ORDER — SODIUM CHLORIDE 0.9% IV SOLUTION: Freq: Once | INTRAVENOUS | Status: DC

## 2023-11-09 MED ORDER — PANTOPRAZOLE SODIUM 40 MG IV SOLR
80.0000 mg | Freq: Once | INTRAVENOUS | Status: AC
  Administered 2023-11-09: 80 mg via INTRAVENOUS
  Filled 2023-11-09: qty 20

## 2023-11-09 NOTE — ED Triage Notes (Signed)
 Pt was sent here by PCP for a HBG of 5.1. Pt is pale on arrival. Pt reports he feels sleepy, but has no other complaints. Diastolic pressure is a little low at 46.

## 2023-11-09 NOTE — H&P (Signed)
 History and Physical    Patient: Gregory Alvarado ZOX:[id number] DOB: Feb 03, 1951 DOA: 11/09/2023 DOS: the patient was seen and examined on 11/10/2023 PCP: Benita Stabile, MD  Patient coming from: Home  Chief Complaint:  Chief Complaint  Patient presents with   Abnormal Lab   HPI: Gregory Alvarado is a 73 y.o. male with medical history significant of  chronic systolic and diastolic CHF, CAD with DES (12/21), type 2 diabetes mellitus, hyperlipidemia, GI bleed, COPD, chronic respiratory failure on supplemental oxygen at 3 - 4 LPM of oxygen, tobacco abuse who presents to the emergency department due to low hemoglobin.  Patient went for a routine visit with his PCP and was later called and asked to go to the ED for further evaluation due to low hemoglobin. Patient has history of chronic GI bleed and has had several episodes of blood transfusion.  He endorsed black stool without noticing any bright red blood in stool.  He complained of 2-week onset of generalized weakness with chest pain and shortness of breath on exertion.  Patient is not on any anticoagulation. Patient was admitted from 1/11 to 1/16 due to acute on chronic combined systolic and diastolic CHF and acute on chronic symptomatic blood loss anemia in which EGD done on 09/28/2023 showed   1. Normal esophagus.    2. 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 Treated with argon plasma coagulation (APC).   3. Normal examined duodenum.    4. 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 Treated with argon plasma coagulation. Was also started on octreotide 100 mg SQ twice daily while inpatient.  ED Course:  In the emergency department, BP was 110/46, other vital signs were within normal range.  Workup in the ED showed WBC 10.2, hemoglobin 5.2, hematocrit 20.1, MCV 79.1, platelets 174 Chest x-ray showed no active disease IV Protonix 80 mg x 1 was given.  3 units of PRBC was ordered in the ED for  transfusion Gastroenterologist (Dr. Levon Hedger) was consulted and will follow-up with patient in the morning   Review of Systems: Review of systems as noted in the HPI. All other systems reviewed and are negative.   Past Medical History:  Diagnosis Date   Anemia    Arthritis    left shoulder   COPD (chronic obstructive pulmonary disease) (HCC)    Coronary artery disease 09/11/2020   NSTEMI and 3 stents in [facility]   Diabetes mellitus without complication (HCC)    Dyslipidemia    Hypertension    Insomnia    Insomnia    Morbid obesity (HCC)    Tobacco use    Past Surgical History:  Procedure Laterality Date   BIOPSY  10/28/2019   Procedure: BIOPSY;  Surgeon: West Bali, MD;  [facility];  Service: Endoscopy;;  gastric   BIOPSY  04/15/2020   Procedure: BIOPSY;  Surgeon: Corbin Ade, MD;  [facility];  Service: Endoscopy;;  gastric   CARDIAC CATHETERIZATION  08/2020   3 stents placed   COLON RESECTION  1990s?   DIVERTICULITIS   COLONOSCOPY WITH PROPOFOL N/A 04/15/2020   Rourk: Poor colon prep. two 6 to 8 mm polyps removed from the hepatic flexure, tubular adenomas.  Repeat colonoscopy later this year.   ENTEROSCOPY N/A 11/26/2021   Procedure: ENTEROSCOPY;  Surgeon: Dolores Frame, MD;  [facility];  Service: Gastroenterology;  Laterality: N/A;   ENTEROSCOPY N/A 03/13/2022   Procedure: ENTEROSCOPY;  Surgeon: Lanelle Bal,  DO;  [facility];  Service: Endoscopy;  Laterality: N/A;   ENTEROSCOPY N/A 09/28/2023   Procedure: ENTEROSCOPY;  Surgeon: Dolores Frame, MD;  [facility];  Service: Gastroenterology;  Laterality: N/A;   ESOPHAGOGASTRODUODENOSCOPY (EGD) WITH PROPOFOL N/A 10/28/2019   Dr. Darrick Penna: Multiple cratered gastric ulcers, gastritis/duodenitis.  Path showed H. pylori.  Patient treated with amoxicillin/Biaxin/PPI twice daily.   ESOPHAGOGASTRODUODENOSCOPY (EGD) WITH PROPOFOL N/A  04/15/2020   Rourk: Patchy gastric erythema status post biopsy to document eradication of H. pylori.  Biopsy showed persistent H. pylori.  Previously noted gastric ulcer is completely healed.   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   ESOPHAGOGASTRODUODENOSCOPY (EGD) WITH PROPOFOL N/A 03/13/2022   Procedure: ESOPHAGOGASTRODUODENOSCOPY (EGD) WITH PROPOFOL;  Surgeon: Lanelle Bal, DO;  [facility];  Service: Endoscopy;  Laterality: N/A;   HERNIA REPAIR     UHR   HOT HEMOSTASIS  11/26/2021   Procedure: HOT HEMOSTASIS (ARGON PLASMA COAGULATION/BICAP);  Surgeon: Marguerita Merles, Reuel Boom, MD;  [facility];  Service: Gastroenterology;;   HOT HEMOSTASIS  09/28/2023   Procedure: HOT HEMOSTASIS (ARGON PLASMA COAGULATION/BICAP);  Surgeon: Marguerita Merles, Reuel Boom, MD;  [facility];  Service: Gastroenterology;;   Elease Hashimoto DILATION N/A 04/15/2020   Procedure: Alvy Beal;  Surgeon: Corbin Ade, MD;  [facility];  Service: Endoscopy;  Laterality: N/A;   POLYPECTOMY  04/15/2020   Procedure: POLYPECTOMY;  Surgeon: Corbin Ade, MD;  [facility];  Service: Endoscopy;;  colon   ROTATOR CUFF REPAIR Bilateral    TYMPANOPLASTY      Social History:  reports that he has been smoking cigarettes. He has never used smokeless tobacco. He reports that he does not currently use alcohol. He reports that he does not use drugs.   Allergies  Allergen Reactions   Lisinopril Swelling    ACEi angioedema requiring intubation 07/2022   Caduet [Amlodipine-Atorvastatin] Swelling    Family History  Problem Relation Age of Onset   Diabetes Mother    Heart attack Mother    Heart attack Father    Heart attack Brother    Colon cancer Neg Hx    Stomach cancer Neg Hx      Prior to Admission medications   Medication Sig Start Date End Date  Taking? Authorizing Provider  acetaminophen (TYLENOL) 500 MG tablet Take 1,000 mg by mouth every 6 (six) hours as needed for mild pain.   Yes [provider]  BREZTRI AEROSPHERE 160-9-4.8 MCG/ACT AERO Inhale 2 puffs into the lungs 2 (two) times daily. 08/26/23  Yes [provider]  cyanocobalamin (,VITAMIN B-12,) 1000 MCG/ML injection Inject 1,000 mcg into the muscle every 30 (thirty) days. 02/11/22  Yes [provider]  diphenhydramine-acetaminophen (TYLENOL PM) 25-500 MG TABS tablet Take 1 tablet by mouth at bedtime as needed (sleep).   Yes [provider]  empagliflozin (JARDIANCE) 10 MG TABS tablet Take 1 tablet (10 mg total) by mouth daily. 08/01/22  Yes Sheikh, Omair Latif, DO  insulin NPH-regular Human (NOVOLIN 70/30) (70-30) 100 UNIT/ML injection Inject 40 Units into the skin in the morning and at bedtime.   Yes [provider]  levocetirizine (XYZAL) 5 MG tablet Take 5 mg by mouth at bedtime. 03/11/22  Yes [provider]  metoprolol succinate (TOPROL-XL) 25 MG 24 hr tablet Take 1 tablet (25 mg total) by mouth  2 (two) times daily. 09/30/23  Yes [name], MD  nitroGLYCERIN (NITROSTAT) 0.4 MG SL tablet DISSOLVE 1 TABLET SUBLINGUALLY AS NEEDED FOR CHEST PAIN, MAY REPEAT EVERY 5 MINUTES. AFTER 3 CALL 911. 06/30/23  Yes Branch, Dorothe Pea, MD  pantoprazole (PROTONIX) 40 MG tablet Take 1 tablet (40 mg total) by mouth 2 (two) times daily. 09/30/23 09/29/24 Yes [name], MD  pregabalin (LYRICA) 25 MG capsule Take 25 mg by mouth 2 (two) times daily. 10/27/23  Yes [provider]  rosuvastatin (CRESTOR) 40 MG tablet Take 1 tablet (40 mg total) by mouth daily. 05/07/23 05/01/24 Yes BranchDorothe Pea, MD  spironolactone (ALDACTONE) 25 MG tablet Take 0.5 tablets (12.5 mg total) by mouth daily. 10/01/23  Yes [name], MD  traMADol (ULTRAM) 50 MG tablet Take 50-100 mg by mouth in the morning and at bedtime. 09/20/19  Yes [provider]  zolpidem (AMBIEN) 5 MG tablet Take 5 mg by mouth at bedtime as needed for sleep. 09/26/20  Yes [provider]    Physical Exam: BP (!) 124/52   Pulse 90   Temp 98.9 F (37.2 C) (Oral)   Resp 19   Ht 6' (1.829 m)   Wt 125.6 kg   SpO2 100%   BMI 37.57 kg/m   General: 73 y.o. year-old male well developed well nourished in no acute distress.  Alert and oriented x3. HEENT: NCAT, EOMI Neck: Supple, trachea medial Cardiovascular: Regular rate and rhythm with no rubs or gallops.  No thyromegaly or JVD noted.  No lower extremity edema. 2/4 pulses in all 4 extremities. Respiratory: Clear to auscultation with no wheezes or rales. Good inspiratory effort. Abdomen: Soft, nontender nondistended with normal bowel sounds x4 quadrants. Muskuloskeletal: No cyanosis, clubbing or edema noted bilaterally Neuro: CN II-XII intact, strength 5/5 x 4, sensation, reflexes intact Skin: No ulcerative lesions noted or rashes Psychiatry: Judgement and insight appear normal. Mood is appropriate for condition and setting          Labs on Admission:  Basic Metabolic Panel: Recent Labs  Lab 11/09/23 1918  NA 136  K 4.5  CL 100  CO2 26  GLUCOSE 220*  BUN 20  CREATININE 1.15  CALCIUM 9.1   Liver Function Tests: Recent Labs  Lab 11/09/23 1918  AST 11*  ALT 8  ALKPHOS 63  BILITOT 0.5  PROT 6.7  ALBUMIN 3.3*   No results for input(s): "LIPASE", "AMYLASE" in the last 168 hours. No results for input(s): "AMMONIA" in the last 168 hours. CBC: Recent Labs  Lab 11/09/23 1918  WBC 10.2  HGB 5.2*  HCT 20.1*  MCV 79.1*  PLT 174   Cardiac Enzymes: No results for input(s): "CKTOTAL", "CKMB", "CKMBINDEX", "TROPONINI" in the last 168 hours.  BNP (last 3 results) Recent Labs    09/25/23 2136  BNP 637.0*    ProBNP (last 3 results) No results for input(s): "PROBNP" in the last 8760 hours.  CBG: No results for input(s): "GLUCAP" in the last 168 hours.  Radiological  Exams on Admission: DG Chest Port 1 View Result Date: 11/09/2023 CLINICAL DATA:  Shortness of breath, cough EXAM: PORTABLE CHEST 1 VIEW COMPARISON:  09/25/2023 FINDINGS: Heart and mediastinal contours are within normal limits. No focal opacities or effusions. No acute bony abnormality. IMPRESSION: No active disease. Electronically Signed   By: Charlett Nose M.D.   On: 11/09/2023 22:26    EKG: I independently viewed the EKG done and my findings are as followed: Sinus rhythm  at rate of 94 bpm with premature supraventricular complexes  Assessment/Plan Present on Admission:  Symptomatic anemia  GI bleed  Chronic combined systolic and diastolic CHF (congestive heart failure) (HCC)  Coronary artery disease  COPD (chronic obstructive pulmonary disease) (HCC)  Chronic respiratory failure with hypoxia (HCC)  Tobacco abuse  Obesity, Class II, BMI 35-39.9  Principal Problem:   Symptomatic anemia Active Problems:   GI bleed   Chronic combined systolic and diastolic CHF (congestive heart failure) (HCC)   Chronic respiratory failure with hypoxia (HCC)   Obesity, Class II, BMI 35-39.9   Tobacco abuse   COPD (chronic obstructive pulmonary disease) (HCC)   Coronary artery disease  Symptomatic anemia Hemoglobin of 5.2.  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 EGD was done in January of this year and findings are as described in HPI above. 3 units of PRBC was ordered to be transfused, but patient was reported to have developed antibodies to PRBC and will require blood being brought from an outside facility.  Acute GI bleed H/H= 5.2/20.1, this was 7.5/28.3 on 09/30/2023 3 units of PRBC will be transfused (pending being transferred from outside facility) Continue IV Protonix 40 mg twice daily Patient will be kept n.p.o. at this time Gastroenterologist  was consulted and will see patient in the morning per EDP  Chronic combined systolic and diastolic CHF Patient appears to be  euvolemic Continue total input/output, daily weights and fluid restriction Consider starting patient's home meds when he resumes oral intake  CAD/ICM Consider starting patient's home metoprolol and Crestor when he resumes oral intake  COPD (not in acute exacerbation) Patient without wheezing at this time Consider starting patient's home meds when he resumes oral intake  Chronic respiratory failure with hypoxia This was possibly due to patient history of CHF and COPD Continue supplemental oxygen via [place] at 2 LPM per home regimen  T2DM Hemoglobin A1c 5.8 (09/26/2023) Continue diet and lifestyle modification Continue to monitor CBG  Tobacco abuse Patient was counseled on tobacco abuse cessation  Class II obesity (BMI 37.57) Diet and lifestyle modification   DVT prophylaxis: SCDs  Code Status: Full code  Family Communication: None at bedside  Consults: Gastroenterology  Severity of Illness: The appropriate patient status for this patient is INPATIENT. Inpatient status is judged to be reasonable and necessary in order to provide the required intensity of service to ensure the patient's safety. The patient's presenting symptoms, physical exam findings, and initial radiographic and laboratory data in the context of their chronic comorbidities is felt to place them at high risk for further clinical deterioration. Furthermore, it is not anticipated that the patient will be medically stable for discharge from the hospital within 2 midnights of admission.   * I certify that at the point of admission it is my clinical judgment that the patient will require inpatient hospital care spanning beyond 2 midnights from the point of admission due to high intensity of service, high risk for further deterioration and high frequency of surveillance required.*  Author: Frankey Shown, DO 11/10/2023 3:01 AM  For on call review [email or web address].

## 2023-11-09 NOTE — ED Provider Notes (Addendum)
 Gregory Alvarado Provider Note   CSN: [id number] Arrival date & time: 11/09/23  1727     History  Chief Complaint  Patient presents with   Abnormal Lab    Gregory Alvarado is a 73 y.o. male.  HPI     73 y.o. male with medical history significant of chronic systolic and diastolic CHF, CAD with DES (12/21), type 2 diabetes mellitus, hyperlipidemia, GI bleed, COPD, chronic respiratory failure on supplemental oxygen, tobacco abuse who presents to the emergency department due to low Hb.  According to the patient, he went to his PCP for routine visit.  He then received a call indicating that his hemoglobin was low, he needed to come to the ER.  Patient has chronic GI bleeding.  He has required transfusions multiple times before.  He does not see blood in his stools.   Review of system is positive for generalized weakness that has been present for the last 2 weeks, along with exertional chest pain and shortness of breath.  Patient is not on any blood thinners.  Home Medications Prior to Admission medications   Medication Sig Start Date End Date Taking? Authorizing Provider  pregabalin (LYRICA) 25 MG capsule Take 25 mg by mouth 2 (two) times daily. 10/27/23  Yes Provider, Historical, Alvarado  acetaminophen (TYLENOL) 500 MG tablet Take 1,000 mg by mouth every 6 (six) hours as needed for mild pain.    Provider, Historical, Alvarado  albuterol (VENTOLIN HFA) 108 (90 Base) MCG/ACT inhaler Inhale 2 puffs into the lungs every 4 (four) hours as needed for wheezing or shortness of breath. 07/15/22   Provider, Historical, Alvarado  BREZTRI AEROSPHERE 160-9-4.8 MCG/ACT AERO Inhale 2 puffs into the lungs 2 (two) times daily. 08/26/23   Provider, Historical, Alvarado  cyanocobalamin (,VITAMIN B-12,) 1000 MCG/ML injection Inject 1,000 mcg into the muscle every 30 (thirty) days. 02/11/22   Provider, Historical, Alvarado  diphenhydramine-acetaminophen (TYLENOL PM) 25-500 MG TABS tablet Take 1  tablet by mouth at bedtime as needed (sleep).    Provider, Historical, Alvarado  empagliflozin (JARDIANCE) 10 MG TABS tablet Take 1 tablet (10 mg total) by mouth daily. 08/01/22   Gregory Alvarado  ferrous sulfate 325 (65 FE) MG tablet Take 1 tablet (325 mg total) by mouth daily with breakfast. 10/13/23   Gregory Alvarado  insulin NPH-regular Human (NOVOLIN 70/30) (70-30) 100 UNIT/ML injection Inject 40 Units into the skin in the morning and at bedtime.    Provider, Historical, Alvarado  levocetirizine (XYZAL) 5 MG tablet Take 5 mg by mouth at bedtime. 03/11/22   Provider, Historical, Alvarado  metoprolol succinate (TOPROL-XL) 25 MG 24 hr tablet Take 1 tablet (25 mg total) by mouth 2 (two) times daily. 09/30/23   Gregory Alvarado  nitroGLYCERIN (NITROSTAT) 0.4 MG SL tablet DISSOLVE 1 TABLET SUBLINGUALLY AS NEEDED FOR CHEST PAIN, MAY REPEAT EVERY 5 MINUTES. AFTER 3 CALL 911. 06/30/23   Gregory Alvarado  octreotide (SANDOSTATIN) 100 MCG/ML SOLN injection Inject 1 mL (100 mcg total) into the skin every 12 (twelve) hours. 10/11/23   Gregory Alvarado  pantoprazole (PROTONIX) 40 MG tablet Take 1 tablet (40 mg total) by mouth 2 (two) times daily. 09/30/23 09/29/24  Gregory Alvarado  rosuvastatin (CRESTOR) 40 MG tablet Take 1 tablet (40 mg total) by mouth daily. 05/07/23 05/01/24  Gregory Alvarado  spironolactone (ALDACTONE) 25 MG tablet Take 0.5 tablets (12.5 mg total) by mouth daily. 10/01/23  Gregory Alvarado  traMADol (ULTRAM) 50 MG tablet Take 50-100 mg by mouth in the morning and at bedtime. 09/20/19   Provider, Historical, Alvarado  zolpidem (AMBIEN) 5 MG tablet Take 5 mg by mouth at bedtime as needed for sleep. 09/26/20   Provider, Historical, Alvarado      Allergies    Lisinopril and Caduet [amlodipine-atorvastatin]    Review of Systems   Review of Systems  All other systems reviewed and are negative.   Physical Exam Updated Vital Signs BP (!) 110/46   Pulse 93   Temp 98.9 F (37.2 C)  (Oral)   Resp 20   Ht 6' (1.829 m)   Wt 125.6 kg   SpO2 91%   BMI 37.57 kg/m  Physical Exam Vitals and nursing note reviewed.  Constitutional:      Appearance: He is well-developed.  HENT:     Head: Atraumatic.  Cardiovascular:     Rate and Rhythm: Normal rate.  Pulmonary:     Effort: Pulmonary effort is normal.  Abdominal:     General: There is distension.     Tenderness: There is no abdominal tenderness.  Musculoskeletal:     Cervical back: Neck supple.  Skin:    General: Skin is warm.  Neurological:     Mental Status: He is alert and oriented to person, place, and time.     ED Results / Procedures / Treatments   Labs (all labs ordered are listed, but only abnormal results are displayed) Labs Reviewed  CBC - Abnormal; Notable for the following components:      Result Value   RBC 2.54 (*)    Hemoglobin 5.2 (*)    HCT 20.1 (*)    MCV 79.1 (*)    MCH 20.5 (*)    MCHC 25.9 (*)    RDW 19.2 (*)    All other components within normal limits  COMPREHENSIVE METABOLIC PANEL - Abnormal; Notable for the following components:   Glucose, Bld 220 (*)    Albumin 3.3 (*)    AST 11 (*)    All other components within normal limits  TYPE AND SCREEN  PREPARE RBC (CROSSMATCH)    EKG EKG Interpretation Date/Time:  Tuesday November 09 2023 18:17:50 EST Ventricular Rate:  94 PR Interval:  182 QRS Duration:  90 QT Interval:  350 QTC Calculation: 437 R Axis:   82  Text Interpretation: Sinus rhythm with Premature supraventricular complexes Nonspecific T wave abnormality Abnormal ECG When compared with ECG of 25-Sep-2023 21:09, PREVIOUS ECG IS PRESENT Nonspecific ST and T wave abnormality Confirmed by Derwood Kaplan [phone]) on 11/09/2023 10:01:02 PM  Radiology No results found.  Procedures .Critical Care  Performed by: Derwood Kaplan, Alvarado Authorized by: Derwood Kaplan, Alvarado   Critical care provider statement:    Critical care time (minutes):  37   Critical care was  necessary to treat or prevent imminent or life-threatening deterioration of the following conditions:  Circulatory failure (Symptomatic anemia, GI bleeding/blood loss)   Critical care was time spent personally by me on the following activities:  Development of treatment plan with patient or surrogate, discussions with consultants, evaluation of patient's response to treatment, examination of patient, ordering and review of laboratory studies, ordering and review of radiographic studies, ordering and performing treatments and interventions, pulse oximetry, re-evaluation of patient's condition, review of old charts and obtaining history from patient or surrogate     Medications Ordered in ED Medications  0.9 %  sodium chloride infusion (Manually  program via Guardrails IV Fluids) (has no administration in time range)    ED Course/ Medical Decision Making/ A&P                                 Medical Decision Making Amount and/or Complexity of Data Reviewed Labs: ordered. Radiology: ordered.  Risk Prescription drug management. Decision regarding hospitalization.   This patient presents to the ED with chief complaint(s) of abnormal hemoglobin along with exertional chest pain, shortness of breath and generalized malaise for the last 2 weeks with pertinent past medical history of CAD, CHF, recurrent GI bleed requiring multiple transfusions.The complaint involves an extensive differential diagnosis and also carries with it a high risk of complications and morbidity.    The differential diagnosis includes  Esophagitis Mallory Weiss tear [place]  Variceal bleeding PUD/Gastritis/ulcers Diverticular bleed Colon cancer Rectal bleed Internal hemorrhoids  Additionally, we also considered type II MI in the differential along with CHF.  The initial plan is to get basic labs, type and screen and transfuse patient.  He has consented for the blood.  Will also get chest x-ray, EKG. If the EKG is  reassuring, I Alvarado not think troponin will be very helpful, as elevated troponin will be likely because of his profound anemia.  He has had elevated troponin in the past, last time he was seen in the ER was a month ago and troponins were reassuring.  Additional history obtained: Records reviewed previous admission documents and previous cardiovascular workup including recent echocardiogram.  Unable to open last catheterization report.  I have reviewed patient's upper endoscopy from 2025.  Independent labs interpretation:  The following labs were independently interpreted:  Patient's hemoglobin is 5.2.  CMP is overall reassuring.  Chest x-ray ordered, no evidence of profound pleural effusion or pulmonary edema.  Consultation: - Consulted or discussed management/test interpretation with external professional: Dr. Levon Hedger, GI.  They will see the patient.   Final Clinical Impression(s) / ED Diagnoses Final diagnoses:  Symptomatic anemia  Gastrointestinal hemorrhage, unspecified gastrointestinal hemorrhage type    Rx / DC Orders ED Discharge Orders     None         Derwood Kaplan, Alvarado 11/09/23 2155    Derwood Kaplan, Alvarado 11/09/23 2201

## 2023-11-10 ENCOUNTER — Encounter: Payer: Self-pay | Admitting: Gastroenterology

## 2023-11-10 DIAGNOSIS — D649 Anemia, unspecified: Secondary | ICD-10-CM | POA: Diagnosis not present

## 2023-11-10 DIAGNOSIS — K921 Melena: Secondary | ICD-10-CM

## 2023-11-10 LAB — COMPREHENSIVE METABOLIC PANEL
ALT: 8 U/L (ref 0–44)
AST: 10 U/L — ABNORMAL LOW (ref 15–41)
Albumin: 3.3 g/dL — ABNORMAL LOW (ref 3.5–5.0)
Alkaline Phosphatase: 57 U/L (ref 38–126)
Anion gap: 8 (ref 5–15)
BUN: 20 mg/dL (ref 8–23)
CO2: 26 mmol/L (ref 22–32)
Calcium: 9.1 mg/dL (ref 8.9–10.3)
Chloride: 102 mmol/L (ref 98–111)
Creatinine, Ser: 1.07 mg/dL (ref 0.61–1.24)
GFR, Estimated: >60 mL/min (ref >60)
Glucose, Bld: 158 mg/dL — ABNORMAL HIGH (ref 70–99)
Potassium: 4.1 mmol/L (ref 3.5–5.1)
Sodium: 136 mmol/L (ref 135–145)
Total Bilirubin: 0.5 mg/dL (ref 0.0–1.2)
Total Protein: 6.7 g/dL (ref 6.5–8.1)

## 2023-11-10 LAB — GLUCOSE, CAPILLARY
Glucose-Capillary: 145 mg/dL — ABNORMAL HIGH (ref 70–99)
Glucose-Capillary: 187 mg/dL — ABNORMAL HIGH (ref 70–99)
Glucose-Capillary: 157 mg/dL — ABNORMAL HIGH (ref 70–99)

## 2023-11-10 LAB — PREPARE RBC (CROSSMATCH)

## 2023-11-10 LAB — CBC
HCT: 20 % — ABNORMAL LOW (ref 39.0–52.0)
Hemoglobin: 5.2 g/dL — CL (ref 13.0–17.0)
MCH: 20.2 pg — ABNORMAL LOW (ref 26.0–34.0)
MCHC: 26 g/dL — ABNORMAL LOW (ref 30.0–36.0)
MCV: 77.5 fL — ABNORMAL LOW (ref 80.0–100.0)
Platelets: 173 10*3/uL (ref 150–400)
RBC: 2.58 MIL/uL — ABNORMAL LOW (ref 4.22–5.81)
RDW: 18.8 % — ABNORMAL HIGH (ref 11.5–15.5)
WBC: 11.2 10*3/uL — ABNORMAL HIGH (ref 4.0–10.5)
nRBC: 0.2 % (ref 0.0–0.2)

## 2023-11-10 LAB — MAGNESIUM: Magnesium: 1.9 mg/dL (ref 1.7–2.4)

## 2023-11-10 LAB — HEMOGLOBIN AND HEMATOCRIT, BLOOD
HCT: 26.6 % — ABNORMAL LOW (ref 39.0–52.0)
Hemoglobin: 7.6 g/dL — ABNORMAL LOW (ref 13.0–17.0)

## 2023-11-10 LAB — MRSA NEXT GEN BY PCR, NASAL: MRSA by PCR Next Gen: NOT DETECTED

## 2023-11-10 LAB — PHOSPHORUS: Phosphorus: 2.1 mg/dL — ABNORMAL LOW (ref 2.5–4.6)

## 2023-11-10 MED ORDER — ACETAMINOPHEN 325 MG PO TABS: 650.0000 mg | ORAL_TABLET | Freq: Four times a day (QID) | ORAL | Status: DC | PRN

## 2023-11-10 MED ORDER — METOPROLOL TARTRATE 5 MG/5ML IV SOLN
2.5000 mg | Freq: Once | INTRAVENOUS | Status: AC
  Administered 2023-11-11: 2.5 mg via INTRAVENOUS
  Filled 2023-11-10: qty 5

## 2023-11-10 MED ORDER — ACETAMINOPHEN 650 MG RE SUPP: 650.0000 mg | Freq: Four times a day (QID) | RECTAL | Status: DC | PRN

## 2023-11-10 MED ORDER — OXYCODONE HCL 5 MG PO TABS
5.0000 mg | ORAL_TABLET | Freq: Once | ORAL | Status: AC | PRN
  Administered 2023-11-10: 5 mg via ORAL
  Filled 2023-11-10: qty 1

## 2023-11-10 MED ORDER — ONDANSETRON HCL 4 MG PO TABS: 4.0000 mg | ORAL_TABLET | Freq: Four times a day (QID) | ORAL | Status: DC | PRN

## 2023-11-10 MED ORDER — MORPHINE SULFATE (PF) 2 MG/ML IV SOLN: 1.0000 mg | INTRAVENOUS | Status: DC | PRN

## 2023-11-10 MED ORDER — CHLORHEXIDINE GLUCONATE CLOTH 2 % EX PADS
6.0000 | MEDICATED_PAD | Freq: Every day | CUTANEOUS | Status: DC
  Administered 2023-11-11: 6 via TOPICAL

## 2023-11-10 MED ORDER — FUROSEMIDE 10 MG/ML IJ SOLN
40.0000 mg | Freq: Once | INTRAMUSCULAR | Status: AC
  Administered 2023-11-10: 40 mg via INTRAVENOUS
  Filled 2023-11-10: qty 4

## 2023-11-10 MED ORDER — OXYCODONE HCL 5 MG PO TABS
5.0000 mg | ORAL_TABLET | Freq: Four times a day (QID) | ORAL | Status: AC | PRN
  Administered 2023-11-10: 5 mg via ORAL
  Filled 2023-11-10: qty 1

## 2023-11-10 MED ORDER — PANTOPRAZOLE SODIUM 40 MG IV SOLR
40.0000 mg | Freq: Two times a day (BID) | INTRAVENOUS | Status: DC
  Administered 2023-11-10 – 2023-11-11 (×3): 40 mg via INTRAVENOUS
  Filled 2023-11-10 (×3): qty 10

## 2023-11-10 MED ORDER — HEPARIN (PORCINE) 25000 UT/250ML-% IV SOLN: 1400.0000 [IU]/h | INTRAVENOUS | Status: DC

## 2023-11-10 MED ORDER — ONDANSETRON HCL 4 MG/2ML IJ SOLN
4.0000 mg | Freq: Four times a day (QID) | INTRAMUSCULAR | Status: DC | PRN
  Filled 2023-11-10: qty 2

## 2023-11-10 NOTE — ED Notes (Signed)
First unit complete

## 2023-11-10 NOTE — Care Management Obs Status (Signed)
 MEDICARE OBSERVATION STATUS NOTIFICATION   Patient Details  Name: DAYVEN LINSLEY MRN: [id number] Date of Birth: 1951-01-03   Medicare Observation Status Notification Given:  Yes    Corey Harold 11/10/2023, 2:39 PM

## 2023-11-10 NOTE — ED Notes (Signed)
 Post 15 minute vs done for blood transfuse.

## 2023-11-10 NOTE — Progress Notes (Signed)
 PROGRESS NOTE    Patient: Gregory Alvarado                            PCP: Benita Stabile, MD                    DOB: July 11, 1951            DOA: 11/09/2023 ZOX:[id number]             DOS: 11/10/2023, 10:33 AM   LOS: 0 days   Date of Service: The patient was seen and examined on 11/10/2023  Subjective:   The patient was seen and examined this morning. Hemodynamically stable. No issues overnight .  Brief Narrative:    Gregory Alvarado is a 73 y.o. male with medical history significant of  chronic systolic and diastolic CHF, CAD with DES (12/21), type 2 diabetes mellitus, hyperlipidemia, GI bleed, COPD, chronic respiratory failure on supplemental oxygen at 3 - 4 LPM of oxygen, tobacco abuse who presents to the emergency department due to low hemoglobin.  Patient went for a routine visit with his PCP and was later called and asked to go to the ED for further evaluation due to low hemoglobin.  Patient has history of chronic GI bleed and has had several episodes of blood transfusion.  He endorsed black stool without noticing any bright red blood in stool.  He complained of 2-week onset of generalized weakness with chest pain and shortness of breath on exertion.  Patient is not on any anticoagulation.  Patient was admitted from 1/11 to 1/16 due to acute on chronic combined systolic and diastolic CHF and acute on chronic symptomatic blood loss anemia in which  EGD done on 09/28/2023 showed   1. Normal esophagus.    2. 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 Treated with argon plasma coagulation (APC).   3. Normal examined duodenum.    4. 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 Treated with argon plasma coagulation. Was also started on octreotide 100 mg SQ twice daily while inpatient.   ED Course:  In the emergency department, BP was 110/46, other vital signs were within normal range.  Workup in the ED showed WBC 10.2, hemoglobin 5.2, hematocrit 20.1, MCV 79.1,  platelets 174 Chest x-ray showed no active disease IV Protonix 80 mg x 1 was given.  3 units of PRBC was ordered in the ED for transfusion Gastroenterologist (Dr. Levon Hedger) was consulted      Assessment & Plan:   Principal Problem:   Symptomatic anemia Active Problems:   GI bleed   Chronic combined systolic and diastolic CHF (congestive heart failure) (HCC)   Chronic respiratory failure with hypoxia (HCC)   Obesity, Class II, BMI 35-39.9   Tobacco abuse   COPD (chronic obstructive pulmonary disease) (HCC)   Coronary artery disease   Assessment/Plan:   Symptomatic anemia Hemoglobin of 5.2.   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 EGD was done in January of this year and findings are as described in HPI above. 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 -H&H every 6 hours    Acute GI bleed H/H= 5.2/20.1, this was 7.5 / 28.3 on 09/30/2023 3 units of PRBC will be transfused (pending being transferred from outside facility) Continue IV Protonix 40 mg twice daily  Patient will be kept n.p.o. at this time Gastroenterologist consulted, pending evaluation N.p.o. Monitoring H&H every 6 hours Vitals including BP stable, if symptomatic will initiate IV fluid    Chronic combined systolic and diastolic CHF Patient appears to be euvolemic Continue total input/output, daily weights and fluid restriction Consider starting patient's home meds when he resumes oral intake   CAD/ICM Consider starting patient's home metoprolol and Crestor when he resumes oral intake   COPD (not in acute exacerbation) Patient without wheezing at this time Consider starting patient's home meds when he resumes oral intake   Chronic respiratory failure with hypoxia This was possibly due to patient history of CHF and COPD Continue supplemental oxygen via [place] at 2 LPM per  home regimen   T2DM Hemoglobin A1c 5.8 (09/26/2023) Continue diet and lifestyle modification Continue to monitor CBG   Tobacco abuse Patient was counseled on tobacco abuse cessation   Class II obesity (BMI 37.57) Diet and lifestyle modification    ------------------------------------------------------------------------------------------------------------------------ Nutritional status:  The patient's BMI is: Body mass index is 37.57 kg/m. I agree with the assessment and plan as outlined ------------------------------------------------------------------------------------------------------------------------------------------------  DVT prophylaxis:  SCDs Start: 11/10/23 0245   Code Status:   Code Status: Full Code  Family Communication: No family member present at bedside -Advance care planning has been discussed.   Admission status:   Status is: Observation The patient remains OBS appropriate and will d/c before 2 midnights.   Disposition: From  - home             Planning for discharge in 1-2 days: to   Procedures:   No admission procedures for hospital encounter.   Antimicrobials:  Anti-infectives (From admission, onward)    None        Medication:   sodium chloride   Intravenous Once   pantoprazole (PROTONIX) IV  40 mg Intravenous Q12H    acetaminophen **OR** acetaminophen, ondansetron **OR** ondansetron (ZOFRAN) IV   Objective:   Vitals:   11/10/23 0615 11/10/23 0629 11/10/23 0835 11/10/23 0850  BP: (!) 141/49 (!) 175/65 (!) 123/56 136/62  Pulse: 98 (!) 102 94 96  Resp:  15 19 16   Temp:  (S) 98.9 F (37.2 C) 98.1 F (36.7 C) 98.3 F (36.8 C)  TempSrc:  Oral Oral Oral  SpO2: 100% 100% 99% 100%  Weight:      Height:       No intake or output data in the 24 hours ending 11/10/23 1033 Filed Weights   11/09/23 1811  Weight: 125.6 kg     Physical examination:   Constitution:  Alert, cooperative, no distress,  Appears calm and  comfortable  Psychiatric:   Normal and stable mood and affect, cognition intact,   HEENT:        Normocephalic, PERRL, otherwise with in Normal limits  Chest:         Chest symmetric Cardio vascular:  S1/S2, RRR, No murmure, No Rubs or Gallops  pulmonary: Clear to auscultation bilaterally, respirations unlabored, negative wheezes / crackles Abdomen: Soft, non-tender, non-distended, bowel sounds,no masses, no organomegaly Muscular skeletal: Limited exam - in bed, able to move all 4 extremities,   Neuro: CNII-XII intact. , normal motor and sensation, reflexes intact  Extremities: No pitting edema lower extremities, +2 pulses  Skin: Dry, warm to touch, negative for any Rashes, No open wounds Wounds: per nursing documentation   ------------------------------------------------------------------------------------------------------------------------------------------    LABs:     Latest Ref Rng & Units 11/10/2023    4:42 AM  11/09/2023    7:18 PM 09/30/2023    4:01 AM  CBC  WBC 4.0 - 10.5 K/uL 11.2  10.2  9.5   Hemoglobin 13.0 - 17.0 g/dL 5.2  5.2  7.5   Hematocrit 39.0 - 52.0 % 20.0  20.1  28.3   Platelets 150 - 400 K/uL 173  174  156       Latest Ref Rng & Units 11/10/2023    4:42 AM 11/09/2023    7:18 PM 09/29/2023    5:42 AM  CMP  Glucose 70 - 99 mg/dL 161  096  045   BUN 8 - 23 mg/dL 20  20  19    Creatinine 0.61 - 1.24 mg/dL 4.09  8.11  9.14   Sodium 135 - 145 mmol/L 136  136  134   Potassium 3.5 - 5.1 mmol/L 4.1  4.5  4.0   Chloride 98 - 111 mmol/L 102  100  91   CO2 22 - 32 mmol/L 26  26  34   Calcium 8.9 - 10.3 mg/dL 9.1  9.1  9.1   Total Protein 6.5 - 8.1 g/dL 6.7  6.7    Total Bilirubin 0.0 - 1.2 mg/dL 0.5  0.5    Alkaline Phos 38 - 126 U/L 57  63    AST 15 - 41 U/L 10  11    ALT 0 - 44 U/L 8  8         Micro Results No results found for this or any previous visit (from the past 240 hours).  Radiology Reports DG Chest Port 1 View Result Date: 11/09/2023 CLINICAL  DATA:  Shortness of breath, cough EXAM: PORTABLE CHEST 1 VIEW COMPARISON:  09/25/2023 FINDINGS: Heart and mediastinal contours are within normal limits. No focal opacities or effusions. No acute bony abnormality. IMPRESSION: No active disease. Electronically Signed   By: Charlett Nose M.D.   On: 11/09/2023 22:26    SIGNED: Kendell Bane, MD, FHM. FAAFP. [facility] - [facility] Time spent - 55 min.  In seeing, evaluating and examining the patient. Reviewing medical records, labs, drawn plan of care. [facility],  Pager (please use [email or web address] to page/ text) Please use Epic Secure Chat for non-urgent communication (7AM-7PM)  If 7PM-7AM, please contact night-coverage [email or web address], 11/10/2023, 10:33 AM

## 2023-11-10 NOTE — Telephone Encounter (Signed)
 CBC completed with PCP on 2/24. Resulted with Hgb of 5.1 and patient referred to the ER, now admitted. Will defer additional work-up to inpatient team. I will follow-up on octreotide appeal process.

## 2023-11-10 NOTE — Hospital Course (Addendum)
 Gregory Alvarado is a 73 y.o. male with medical history significant of  chronic systolic and diastolic CHF, CAD with DES (12/21), type 2 diabetes mellitus, hyperlipidemia, GI bleed, COPD, chronic respiratory failure on supplemental oxygen at 3 - 4 LPM of oxygen, tobacco abuse who presents to the emergency department due to low hemoglobin.  Patient went for a routine visit with his PCP and was later called and asked to go to the ED for further evaluation due to low hemoglobin.  Patient has history of chronic GI bleed and has had several episodes of blood transfusion.  He endorsed black stool without noticing any bright red blood in stool.  He complained of 2-week onset of generalized weakness with chest pain and shortness of breath on exertion.  Patient is not on any anticoagulation.  Patient was admitted from 1/11 to 1/16 due to acute on chronic combined systolic and diastolic CHF and acute on chronic symptomatic blood loss anemia in which  EGD done on 09/28/2023 showed   1. Normal esophagus.    2. 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 Treated with argon plasma coagulation (APC).   3. Normal examined duodenum.    4. 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 Treated with argon plasma coagulation. Was also started on octreotide 100 mg SQ twice daily while inpatient.   ED Course:  In the emergency department, BP was 110/46, other vital signs were within normal range.  Workup in the ED showed WBC 10.2, hemoglobin 5.2, hematocrit 20.1, MCV 79.1, platelets 174 Chest x-ray showed no active disease IV Protonix 80 mg x 1 was given.  3 units of PRBC was ordered in the ED for transfusion Gastroenterologist (Dr. Levon Hedger) was consulted      Assessment & Plan:   Principal Problem:   Symptomatic anemia Active Problems:   GI bleed   Chronic combined systolic and diastolic CHF (congestive heart failure) (HCC)   Chronic respiratory failure with hypoxia (HCC)    Obesity, Class II, BMI 35-39.9   Tobacco abuse   COPD (chronic obstructive pulmonary disease) (HCC)   Coronary artery disease   Assessment/Plan:   Symptomatic anemia Hemoglobin of 5.2.   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 EGD was done in January of this year and findings are as described in HPI above. 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 -H&H every 6 hours    Acute GI bleed H/H= 5.2/20.1, this was 7.5 / 28.3 on 09/30/2023 3 units of PRBC will be transfused (pending being transferred from outside facility) Continue IV Protonix 40 mg twice daily Patient will be kept n.p.o. at this time Gastroenterologist consulted, pending evaluation N.p.o. Monitoring H&H every 6 hours Vitals including BP stable, if symptomatic will initiate IV fluid    Chronic combined systolic and diastolic CHF Patient appears to be euvolemic Continue total input/output, daily weights and fluid restriction Consider starting patient's home meds when he resumes oral intake   CAD/ICM Consider starting patient's home metoprolol and Crestor when he resumes oral intake   COPD (not in acute exacerbation) Patient without wheezing at this time Consider starting patient's home meds when he resumes oral intake   Chronic respiratory failure with hypoxia This was possibly due to patient history of CHF and COPD Continue supplemental oxygen via  at 2 LPM per home regimen   T2DM Hemoglobin A1c 5.8 (09/26/2023)  Continue diet and lifestyle modification Continue to monitor CBG   Tobacco abuse Patient was counseled on tobacco abuse cessation   Class II obesity (BMI 37.57) Diet and lifestyle modification

## 2023-11-10 NOTE — Plan of Care (Signed)

## 2023-11-10 NOTE — Consult Note (Signed)
 Gastroenterology Consult   Referring Provider: No ref. provider found Primary Care Physician:  Benita Stabile, MD Primary Gastroenterologist:  Dr.Carver   Patient ID: Gregory Alvarado; [id number]; Feb 18, 1951   Admit date: 11/09/2023  LOS: 0 days   Date of Consultation: 11/10/2023  Reason for Consultation:  anemia   History of Present Illness   Gregory Alvarado is a 73 y.o. 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  Patient presented this admission with low hemoglobin found on outpatient labs though denied any overt GI bleeding.  GI consulted for further evaluation  ED course: Hemoglobin 5.2  Consult: Patient states he began weak for the past few days to a week. Denies any abdominal pain, nausea or vomiting. He endorses chronic mix of constipation and diarrhea. He denies any rectal bleeding or melena. Reports stools were very dark but not black. States stools have been this way for some time. States that he was not yet started on octreotide due to the cost ($1700/month) which he could not afford.    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   Past Medical History:  Diagnosis Date   Anemia    Arthritis    left shoulder   COPD (chronic obstructive pulmonary disease) (HCC)    Coronary artery disease 09/11/2020   NSTEMI and 3 stents in [facility]   Diabetes mellitus without complication (HCC)    Dyslipidemia    Hypertension    Insomnia    Insomnia    Morbid obesity (HCC)    Tobacco use     Past Surgical History:  Procedure Laterality Date   BIOPSY  10/28/2019   Procedure: BIOPSY;  Surgeon: West Bali, MD;  [facility];  Service: Endoscopy;;  gastric   BIOPSY  04/15/2020   Procedure: BIOPSY;  Surgeon: Corbin Ade, MD;  [facility];  Service: Endoscopy;;  gastric   CARDIAC CATHETERIZATION  08/2020   3 stents placed   COLON RESECTION  1990s?   DIVERTICULITIS   COLONOSCOPY WITH PROPOFOL N/A 04/15/2020   Rourk: Poor colon prep. two 6 to 8 mm polyps removed from the hepatic flexure, tubular adenomas.  Repeat colonoscopy later this year.   ENTEROSCOPY N/A 11/26/2021   Procedure: ENTEROSCOPY;  Surgeon: Dolores Frame, MD;  [facility];  Service: Gastroenterology;  Laterality: N/A;   ENTEROSCOPY N/A 03/13/2022   Procedure: ENTEROSCOPY;  Surgeon: Lanelle Bal, DO;  [facility];  Service: Endoscopy;  Laterality: N/A;   ENTEROSCOPY N/A 09/28/2023   Procedure: ENTEROSCOPY;  Surgeon: Dolores Frame, MD;  [facility];  Service: Gastroenterology;  Laterality: N/A;   ESOPHAGOGASTRODUODENOSCOPY (EGD) WITH PROPOFOL N/A 10/28/2019   Dr. Darrick Penna: Multiple  cratered gastric ulcers, gastritis/duodenitis.  Path showed H. pylori.  Patient treated with amoxicillin/Biaxin/PPI twice daily.   ESOPHAGOGASTRODUODENOSCOPY (EGD) WITH PROPOFOL N/A 04/15/2020   Rourk: Patchy gastric erythema status post biopsy to document eradication of H. pylori.  Biopsy showed persistent H. pylori.  Previously noted gastric ulcer is completely healed.    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   ESOPHAGOGASTRODUODENOSCOPY (EGD) WITH PROPOFOL N/A 03/13/2022   Procedure: ESOPHAGOGASTRODUODENOSCOPY (EGD) WITH PROPOFOL;  Surgeon: Lanelle Bal, DO;  [facility];  Service: Endoscopy;  Laterality: N/A;   HERNIA REPAIR     UHR   HOT HEMOSTASIS  11/26/2021   Procedure: HOT HEMOSTASIS (ARGON PLASMA COAGULATION/BICAP);  Surgeon: Marguerita Merles, Reuel Boom, MD;  [facility];  Service: Gastroenterology;;   HOT HEMOSTASIS  09/28/2023   Procedure: HOT HEMOSTASIS (ARGON PLASMA COAGULATION/BICAP);  Surgeon: Marguerita Merles, Reuel Boom, MD;  [facility];  Service: Gastroenterology;;   Elease Hashimoto DILATION N/A 04/15/2020   Procedure: Alvy Beal;  Surgeon: Corbin Ade, MD;  [facility];  Service: Endoscopy;  Laterality: N/A;   POLYPECTOMY  04/15/2020   Procedure: POLYPECTOMY;  Surgeon: Corbin Ade, MD;  [facility];  Service: Endoscopy;;  colon   ROTATOR CUFF REPAIR Bilateral    TYMPANOPLASTY      Prior to Admission medications   Medication Sig Start Date End Date Taking? Authorizing Provider  acetaminophen (TYLENOL) 500 MG tablet Take 1,000 mg by mouth every 6 (six) hours as needed for mild pain.   Yes [provider]  BREZTRI AEROSPHERE 160-9-4.8 MCG/ACT AERO Inhale 2 puffs into the lungs 2 (two) times daily. 08/26/23  Yes [provider]  cyanocobalamin (,VITAMIN B-12,) 1000 MCG/ML injection Inject 1,000 mcg into the muscle every 30 (thirty) days. 02/11/22  Yes [provider]  diphenhydramine-acetaminophen (TYLENOL PM) 25-500 MG TABS tablet Take 1 tablet by mouth at bedtime as needed (sleep).   Yes [provider]  empagliflozin (JARDIANCE) 10 MG TABS tablet Take 1 tablet (10 mg total) by mouth daily. 08/01/22  Yes Sheikh, Omair Latif, DO  insulin  NPH-regular Human (NOVOLIN 70/30) (70-30) 100 UNIT/ML injection Inject 40 Units into the skin in the morning and at bedtime.   Yes [provider]  levocetirizine (XYZAL) 5 MG tablet Take 5 mg by mouth at bedtime. 03/11/22  Yes [provider]  metoprolol succinate (TOPROL-XL) 25 MG 24 hr tablet Take 1 tablet (25 mg total) by mouth 2 (two) times daily. 09/30/23  Yes [name], MD  nitroGLYCERIN (NITROSTAT) 0.4 MG SL tablet DISSOLVE 1 TABLET SUBLINGUALLY AS NEEDED FOR CHEST PAIN, MAY REPEAT EVERY 5 MINUTES. AFTER 3 CALL 911. 06/30/23  Yes Branch, Dorothe Pea, MD  pantoprazole (PROTONIX) 40 MG tablet Take 1 tablet (40 mg total) by mouth 2 (two) times daily. 09/30/23 09/29/24 Yes [name], MD  pregabalin (LYRICA) 25 MG capsule Take 25 mg by mouth 2 (two) times daily. 10/27/23  Yes [provider]  rosuvastatin (CRESTOR) 40 MG tablet Take 1 tablet (40 mg total) by mouth daily. 05/07/23 05/01/24 Yes BranchDorothe Pea, MD  spironolactone (ALDACTONE) 25 MG tablet Take 0.5 tablets (12.5 mg total) by mouth daily. 10/01/23  Yes [name], MD  traMADol (ULTRAM) 50 MG tablet Take 50-100 mg by mouth in the morning and at bedtime. 09/20/19  Yes [provider]  zolpidem (AMBIEN) 5 MG tablet  Take 5 mg by mouth at bedtime as needed for sleep. 09/26/20  Yes [provider]    Current Facility-Administered Medications  Medication Dose Route Frequency Provider Last Rate Last Admin   0.9 %  sodium chloride infusion (Manually program via Guardrails IV Fluids)   Intravenous Once Adefeso, Oladapo, DO       acetaminophen (TYLENOL) tablet 650 mg  650 mg Oral Q6H PRN Adefeso, Oladapo, DO       Or   acetaminophen (TYLENOL) suppository 650 mg  650 mg Rectal Q6H PRN Adefeso, Oladapo, DO       ondansetron (ZOFRAN) tablet 4 mg  4 mg Oral Q6H PRN Adefeso, Oladapo, DO       Or   ondansetron (ZOFRAN) injection 4 mg  4 mg Intravenous Q6H PRN Adefeso, Oladapo, DO       pantoprazole  (PROTONIX) injection 40 mg  40 mg Intravenous Q12H Adefeso, Oladapo, DO   40 mg at 11/10/23 4098   Current Outpatient Medications  Medication Sig Dispense Refill   acetaminophen (TYLENOL) 500 MG tablet Take 1,000 mg by mouth every 6 (six) hours as needed for mild pain.     BREZTRI AEROSPHERE 160-9-4.8 MCG/ACT AERO Inhale 2 puffs into the lungs 2 (two) times daily.     cyanocobalamin (,VITAMIN B-12,) 1000 MCG/ML injection Inject 1,000 mcg into the muscle every 30 (thirty) days.     diphenhydramine-acetaminophen (TYLENOL PM) 25-500 MG TABS tablet Take 1 tablet by mouth at bedtime as needed (sleep).     empagliflozin (JARDIANCE) 10 MG TABS tablet Take 1 tablet (10 mg total) by mouth daily. 30 tablet 0   insulin NPH-regular Human (NOVOLIN 70/30) (70-30) 100 UNIT/ML injection Inject 40 Units into the skin in the morning and at bedtime.     levocetirizine (XYZAL) 5 MG tablet Take 5 mg by mouth at bedtime.     metoprolol succinate (TOPROL-XL) 25 MG 24 hr tablet Take 1 tablet (25 mg total) by mouth 2 (two) times daily. 60 tablet 1   nitroGLYCERIN (NITROSTAT) 0.4 MG SL tablet DISSOLVE 1 TABLET SUBLINGUALLY AS NEEDED FOR CHEST PAIN, MAY REPEAT EVERY 5 MINUTES. AFTER 3 CALL 911. 25 tablet 3   pantoprazole (PROTONIX) 40 MG tablet Take 1 tablet (40 mg total) by mouth 2 (two) times daily. 60 tablet 2   pregabalin (LYRICA) 25 MG capsule Take 25 mg by mouth 2 (two) times daily.     rosuvastatin (CRESTOR) 40 MG tablet Take 1 tablet (40 mg total) by mouth daily. 90 tablet 3   spironolactone (ALDACTONE) 25 MG tablet Take 0.5 tablets (12.5 mg total) by mouth daily. 30 tablet 1   traMADol (ULTRAM) 50 MG tablet Take 50-100 mg by mouth in the morning and at bedtime.     zolpidem (AMBIEN) 5 MG tablet Take 5 mg by mouth at bedtime as needed for sleep.      Allergies as of 11/09/2023 - Review Complete 11/09/2023  Allergen Reaction Noted   Lisinopril Swelling 07/26/2022   Caduet [amlodipine-atorvastatin] Swelling  09/18/2015    Family History  Problem Relation Age of Onset   Diabetes Mother    Heart attack Mother    Heart attack Father    Heart attack Brother    Colon cancer Neg Hx    Stomach cancer Neg Hx     Social History   Socioeconomic History   Marital status: Divorced    Spouse name: Not on file   Number of children: 2   Years of  education: Not on file   Highest education level: High school graduate  Occupational History   Occupation: Retired  Tobacco Use   Smoking status: Every Day    Current packs/day: 2.00    Types: Cigarettes   Smokeless tobacco: Never  Vaping Use   Vaping status: Never Used  Substance and Sexual Activity   Alcohol use: Not Currently    Comment: very rare   Drug use: No   Sexual activity: Not on file  Other Topics Concern   Not on file  Social History Narrative   Not on file   Social Drivers of Health   Financial Resource Strain: Low Risk  (08/06/2020)   Overall Financial Resource Strain (CARDIA)    Difficulty of Paying Living Expenses: Not very hard  Food Insecurity: No Food Insecurity (11/10/2023)   Hunger Vital Sign    Worried About Running Out of Food in the Last Year: Never true    Ran Out of Food in the Last Year: Never true  Transportation Needs: No Transportation Needs (11/10/2023)   PRAPARE - Administrator, Civil Service (Medical): No    Lack of Transportation (Non-Medical): No  Physical Activity: Inactive (08/06/2020)   Exercise Vital Sign    Days of Exercise per Week: 0 days    Minutes of Exercise per Session: 0 min  Stress: No Stress Concern Present (08/06/2020)   [employer] of Occupational Health - Occupational Stress Questionnaire    Feeling of Stress : Not at all  Social Connections: Socially Isolated (11/10/2023)   Social Connection and Isolation Panel [NHANES]    Frequency of Communication with Friends and Family: More than three times a week    Frequency of Social Gatherings with Friends and Family:  More than three times a week    Attends Religious Services: Never    Database administrator or Organizations: No    Attends Banker Meetings: Never    Marital Status: Divorced  Catering manager Violence: Not At Risk (11/10/2023)   Humiliation, Afraid, Rape, and Kick questionnaire    Fear of Current or Ex-Partner: No    Emotionally Abused: No    Physically Abused: No    Sexually Abused: No     Review of Systems   Gen: Denies any fever, chills, loss of appetite, change in weight or weight loss CV: Denies chest pain, heart palpitations, syncope, edema  Resp: Denies shortness of breath with rest, cough, wheezing, coughing up blood, and pleurisy. GI:  denies hematochezia, nausea, vomiting, dysphagia, odyonophagia, early satiety or weight loss. +dark stools GU : Denies urinary burning, blood in urine, urinary frequency, and urinary incontinence. MS: Denies joint pain, limitation of movement, swelling, cramps, and atrophy.  Derm: Denies rash, itching, dry skin, hives. Psych: Denies depression, anxiety, memory loss, hallucinations, and confusion. Heme: Denies bruising or bleeding Neuro:  Denies any headaches, dizziness, paresthesias, shaking  Physical Exam   Vital Signs in last 24 hours: Temp:  [97.7 F (36.5 C)-98.9 F (37.2 C)] 98.3 F (36.8 C) (02/26 0850) Pulse Rate:  [89-208] 96 (02/26 0850) Resp:  [15-20] 16 (02/26 0850) BP: (103-175)/(36-65) 136/62 (02/26 0850) SpO2:  [67 %-100 %] 100 % (02/26 0850) Weight:  [125.6 kg] 125.6 kg (02/25 1811)    General:   Alert,  Well-developed, well-nourished, pleasant and cooperative in NAD Head:  Normocephalic and atraumatic. Eyes:  Sclera clear, no icterus.   Conjunctiva pink. Ears:  Normal auditory acuity. Mouth:  No deformity or lesions, dentition  normal. Lungs:  Clear throughout to auscultation.   No wheezes, crackles, or rhonchi. No acute distress. Heart:  Regular rate and rhythm; no murmurs, clicks, rubs,  or  gallops. Abdomen:  Soft, nontender and nondistended. No masses, hepatosplenomegaly or hernias noted. Normal bowel sounds, without guarding, and without rebound.   Neurologic:  Alert and  oriented x4. Skin:  Intact without significant lesions or rashes. Psych:  Alert and cooperative. Normal mood and affect.   Labs/Studies   Recent Labs Recent Labs    11/09/23 1918 11/10/23 0442  WBC 10.2 11.2*  HGB 5.2* 5.2*  HCT 20.1* 20.0*  PLT 174 173   BMET Recent Labs    11/09/23 1918 11/10/23 0442  NA 136 136  K 4.5 4.1  CL 100 102  CO2 26 26  GLUCOSE 220* 158*  BUN 20 20  CREATININE 1.15 1.07  CALCIUM 9.1 9.1   LFT Recent Labs    11/09/23 1918 11/10/23 0442  PROT 6.7 6.7  ALBUMIN 3.3* 3.3*  AST 11* 10*  ALT 8 8  ALKPHOS 63 57  BILITOT 0.5 0.5    Radiology/Studies DG Chest Port 1 View Result Date: 11/09/2023 CLINICAL DATA:  Shortness of breath, cough EXAM: PORTABLE CHEST 1 VIEW COMPARISON:  09/25/2023 FINDINGS: Heart and mediastinal contours are within normal limits. No focal opacities or effusions. No acute bony abnormality. IMPRESSION: No active disease. Electronically Signed   By: Charlett Nose M.D.   On: 11/09/2023 22:26     Assessment   Gregory Alvarado is a 73 y.o. 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  Patient presented this admission with low hemoglobin found on outpatient labs though denied any overt GI bleeding.  GI consulted for further evaluation  Acute on chronic anemia/melena: Hemoglobin 5.2 on admission. Patient reports  ongoing dark stools, no BRBPR. Recent small bowel enteroscopy in january as outlined above, patient was recommended to start on octreotide thereafter for further mangement of angiodysplastic lesions though has not yet been able to obtain this and our outpatient team is still currently working to get him started on this. He denies other acute GI symptoms. At this time, I suspect we are dealing with ongoing bleeding from the small bowel and known angiodysplastic lesions, endoscopic evaluation would like be of low yield, though will discuss case further with Dr. Jena Gauss.  Would recommend conservative measures for now, optimizing blood counts via transfusion and attempting to get him started on octreotide.    Plan / Recommendations   Continue PPI twice daily octreotide 100 mg SQ twice daily Monitor for overt GI bleeding Trend h&h, proceed with transfusion of 3 units PRBCs as ordered by hospitalist     11/10/2023, 9:34 AM  Eathen Budreau L. Jeanmarie Hubert, MSN, APRN, AGNP-C Adult-Gerontology Nurse Practitioner [facility] at [facility]

## 2023-11-10 NOTE — ED Notes (Signed)
 Pt requesting coffee and to sit on edge of bed; pt sitting on edge of bed and called AC to request a hospital bed

## 2023-11-10 NOTE — Progress Notes (Signed)
   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 We will continue to monitor patient advancement through interdisciplinary progression rounds. If new patient transition needs arise, please place a TOC consult.

## 2023-11-10 NOTE — Progress Notes (Signed)
 PHARMACY - ANTICOAGULATION CONSULT NOTE  Pharmacy Consult for IV heparin  Indication: atrial fibrillation  Allergies  Allergen Reactions   Lisinopril Swelling    ACEi angioedema requiring intubation 07/2022   Caduet [Amlodipine-Atorvastatin] Swelling    Patient Measurements: Height: 6' (182.9 cm) Weight: 125.6 kg (276 lb 15.8 oz) IBW/kg (Calculated) : 77.6 Heparin Dosing Weight: 105.6 kg  Vital Signs: Temp: 98.9 F (37.2 C) (02/25 1813) Temp Source: Oral (02/25 1813) BP: 124/52 (02/26 0130) Pulse Rate: 90 (02/26 0130)  Labs: Recent Labs    11/09/23 1918  HGB 5.2*  HCT 20.1*  PLT 174  CREATININE 1.15    Estimated Creatinine Clearance: 81.8 mL/min (by C-G formula based on SCr of 1.15 mg/dL).   Medical History: Past Medical History:  Diagnosis Date   Anemia    Arthritis    left shoulder   COPD (chronic obstructive pulmonary disease) (HCC)    Coronary artery disease 09/11/2020   NSTEMI and 3 stents in [facility]   Diabetes mellitus without complication (HCC)    Dyslipidemia    Hypertension    Insomnia    Insomnia    Morbid obesity (HCC)    Tobacco use    Assessment: Gregory Alvarado is a 73 y.o. year old male admitted on 11/09/2023 with concern for afib. No anticoagulation prior to admission. Pharmacy consulted to dose heparin.  Goal of Therapy:  Heparin level 0.3-0.7 units/ml Monitor platelets by anticoagulation protocol: Yes   Plan:  Heparin infusion at 1400 units/hr (no bolus) 6 heparin level  Daily heparin level, CBC, and monitoring for bleeding F/u plans for anticoagulation   Thank you for allowing pharmacy to participate in this patient's care.  Marja Kays, PharmD Emergency Medicine Clinical Pharmacist 11/10/2023,2:11 AM

## 2023-11-10 NOTE — Telephone Encounter (Signed)
 Appeal letter has been completed. Routing to Tammy to send letter in and follow-up on approval.

## 2023-11-10 NOTE — Plan of Care (Signed)

## 2023-11-10 NOTE — Telephone Encounter (Signed)
 See separate telephone encounter from 2/11 for additional documentation.

## 2023-11-10 NOTE — ED Notes (Signed)
 Blood product verified with Tarry Kos, RN for pt.

## 2023-11-11 ENCOUNTER — Encounter (HOSPITAL_COMMUNITY): Payer: Self-pay | Admitting: Hematology

## 2023-11-11 ENCOUNTER — Other Ambulatory Visit (HOSPITAL_COMMUNITY): Payer: Self-pay

## 2023-11-11 DIAGNOSIS — D649 Anemia, unspecified: Secondary | ICD-10-CM | POA: Diagnosis not present

## 2023-11-11 LAB — TYPE AND SCREEN
ABO/RH(D): A POS
Antibody Screen: POSITIVE
Donor AG Type: NEGATIVE
Donor AG Type: NEGATIVE
Donor AG Type: NEGATIVE
Unit division: 0
Unit division: 0
Unit division: 0

## 2023-11-11 LAB — BPAM RBC
Blood Product Expiration Date: 202503102359
Blood Product Expiration Date: 202503112359
Blood Product Expiration Date: 202503122359
ISSUE DATE / TIME: 202502260829
ISSUE DATE / TIME: 202502261156
ISSUE DATE / TIME: 202502261156
Unit Type and Rh: 5100
Unit Type and Rh: 5100
Unit Type and Rh: 6200

## 2023-11-11 LAB — GLUCOSE, CAPILLARY
Glucose-Capillary: 150 mg/dL — ABNORMAL HIGH (ref 70–99)
Glucose-Capillary: 152 mg/dL — ABNORMAL HIGH (ref 70–99)
Glucose-Capillary: 232 mg/dL — ABNORMAL HIGH (ref 70–99)

## 2023-11-11 LAB — CBC
HCT: 28.1 % — ABNORMAL LOW (ref 39.0–52.0)
Hemoglobin: 8.1 g/dL — ABNORMAL LOW (ref 13.0–17.0)
MCH: 22.3 pg — ABNORMAL LOW (ref 26.0–34.0)
MCHC: 28.8 g/dL — ABNORMAL LOW (ref 30.0–36.0)
MCV: 77.4 fL — ABNORMAL LOW (ref 80.0–100.0)
Platelets: 177 K/uL (ref 150–400)
RBC: 3.63 MIL/uL — ABNORMAL LOW (ref 4.22–5.81)
RDW: 18.6 % — ABNORMAL HIGH (ref 11.5–15.5)
WBC: 10.7 K/uL — ABNORMAL HIGH (ref 4.0–10.5)
nRBC: 0.2 % (ref 0.0–0.2)

## 2023-11-11 LAB — HEMOGLOBIN AND HEMATOCRIT, BLOOD
HCT: 28 % — ABNORMAL LOW (ref 39.0–52.0)
Hemoglobin: 7.9 g/dL — ABNORMAL LOW (ref 13.0–17.0)

## 2023-11-11 MED ORDER — OCTREOTIDE ACETATE 100 MCG/ML IJ SOLN: 100.0000 ug | Freq: Two times a day (BID) | INTRAMUSCULAR | 1 refills | Status: DC

## 2023-11-11 MED ORDER — OCTREOTIDE ACETATE 100 MCG/ML IJ SOLN
100.0000 ug | Freq: Two times a day (BID) | INTRAMUSCULAR | Status: DC
  Administered 2023-11-11: 100 ug via SUBCUTANEOUS
  Filled 2023-11-11 (×3): qty 1

## 2023-11-11 MED ORDER — OXYCODONE HCL 5 MG PO TABS
5.0000 mg | ORAL_TABLET | Freq: Four times a day (QID) | ORAL | Status: DC | PRN
  Administered 2023-11-11: 5 mg via ORAL
  Filled 2023-11-11: qty 1

## 2023-11-11 NOTE — Plan of Care (Signed)
   Problem: Education: Goal: Knowledge of General Education information will improve Description Including pain rating scale, medication(s)/side effects and non-pharmacologic comfort measures Outcome: Progressing   Problem: Health Behavior/Discharge Planning: Goal: Ability to manage health-related needs will improve Outcome: Progressing

## 2023-11-11 NOTE — Telephone Encounter (Signed)
 Appeal documents have been faxed to the insurance for review.

## 2023-11-11 NOTE — Discharge Summary (Signed)
 Physician Discharge Summary   Patient: Gregory Alvarado MRN: [id number] DOB: Mar 13, 1951  Admit date:     11/09/2023  Discharge date: 11/11/23  Discharge Physician: Kendell Bane   PCP: Benita Stabile, MD   Recommendations at discharge:   Follow-up with a gastroenterologist and their office staff as soon as possible as they are in the process of assisting providing approval for octreotide Avoid all NSAIDs such as aspirin, ibuprofen, naproxen etc. CBC on November 13, 2023 results to PCP and gastroenterologist  Discharge Diagnoses: Principal Problem:   Symptomatic anemia Active Problems:   GI bleed   Chronic combined systolic and diastolic CHF (congestive heart failure) (HCC)   Chronic respiratory failure with hypoxia (HCC)   Obesity, Class II, BMI 35-39.9   Tobacco abuse   COPD (chronic obstructive pulmonary disease) (HCC)   Coronary artery disease  Resolved Problems:   * No resolved hospital problems. *  Hospital Course:  Gregory Alvarado is a 73 y.o. male with medical history significant of  chronic systolic and diastolic CHF, CAD with DES (12/21), type 2 diabetes mellitus, hyperlipidemia, GI bleed, COPD, chronic respiratory failure on supplemental oxygen at 3 - 4 LPM of oxygen, tobacco abuse who presents to the emergency department due to low hemoglobin.  Patient went for a routine visit with his PCP and was later called and asked to go to the ED for further evaluation due to low hemoglobin.  Patient has history of chronic GI bleed and has had several episodes of blood transfusion.  He endorsed black stool without noticing any bright red blood in stool.  He complained of 2-week onset of generalized weakness with chest pain and shortness of breath on exertion.  Patient is not on any anticoagulation.  Patient was admitted from 1/11 to 1/16 due to acute on chronic combined systolic and diastolic CHF and acute on chronic symptomatic blood loss anemia in which  EGD done on 09/28/2023 showed    1. Normal esophagus.    2. 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 Treated with argon plasma coagulation (APC).   3. Normal examined duodenum.    4. 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 Treated with argon plasma coagulation. Was also started on octreotide 100 mg SQ twice daily while inpatient.   ED Course:  In the emergency department, BP was 110/46, other vital signs were within normal range.  Workup in the ED showed WBC 10.2, hemoglobin 5.2, hematocrit 20.1, MCV 79.1, platelets 174 Chest x-ray showed no active disease IV Protonix 80 mg x 1 was given.  3 units of PRBC was ordered in the ED for transfusion Gastroenterologist (Dr. Levon Hedger) was consulted      Assessment & Plan:   Principal Problem:   Symptomatic anemia Active Problems:   GI bleed   Chronic combined systolic and diastolic CHF (congestive heart failure) (HCC)   Chronic respiratory failure with hypoxia (HCC)   Obesity, Class II, BMI 35-39.9   Tobacco abuse   COPD (chronic obstructive pulmonary disease) (HCC)   Coronary artery disease   Assessment/Plan:   Symptomatic anemia Hemoglobin of 5.2.   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 EGD was done in January of this year and findings are as described in HPI above. 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 -H&H every 6 hours    Acute GI bleed H/H=  5.2/20.1, this was 7.5 / 28.3 on 09/30/2023 3 units of PRBC Cecily transfused  IV Protonix 40 mg twice daily>> to p.o.    Latest Ref Rng & Units 11/11/2023    8:38 AM 11/11/2023   12:15 AM 11/10/2023    6:03 PM  CBC  WBC 4.0 - 10.5 K/uL 10.7     Hemoglobin 13.0 - 17.0 g/dL 8.1  7.9  7.6   Hematocrit 39.0 - 52.0 % 28.1  28.0  26.6   Platelets 150 - 400 K/uL 177      Hemoglobin stabilized Consulted, evaluated patient, recommending no  further workup as his last endoscopy was this past January 2025  Working to obtain his medication of octreotide to the office    Chronic combined systolic and diastolic CHF Patient appears to be euvolemic Continue total input/output, daily weights and fluid restriction Consider starting patient's home meds when he resumes oral intake   CAD/ICM Consider starting patient's home metoprolol and Crestor when he resumes oral intake   COPD (not in acute exacerbation) Patient without wheezing at this time Consider starting patient's home meds when he resumes oral intake   Chronic respiratory failure with hypoxia This was possibly due to patient history of CHF and COPD Continue supplemental oxygen via [place] at 2 LPM per home regimen   T2DM Hemoglobin A1c 5.8 (09/26/2023) Continue diet and lifestyle modification Continue to monitor CBG   Tobacco abuse Patient was counseled on tobacco abuse cessation   Class II obesity (BMI 37.57) Diet and lifestyle modification   Consultants: Neurologist Dr. Jena Gauss Procedures performed: None Disposition: Home Diet recommendation:  Discharge Diet Orders (From admission, onward)     Start     Ordered   11/11/23 0000  Diet - low sodium heart healthy        11/11/23 1149           Cardiac diet DISCHARGE MEDICATION: Allergies as of 11/11/2023       Reactions   Lisinopril Swelling   ACEi angioedema requiring intubation 07/2022   Caduet [amlodipine-atorvastatin] Swelling        Medication List     TAKE these medications    acetaminophen 500 MG tablet Commonly known as: TYLENOL Take 1,000 mg by mouth every 6 (six) hours as needed for mild pain.   Breztri Aerosphere 160-9-4.8 MCG/ACT Aero Generic drug: Budeson-Glycopyrrol-Formoterol Inhale 2 puffs into the lungs 2 (two) times daily.   cyanocobalamin 1000 MCG/ML injection Commonly known as: VITAMIN B12 Inject 1,000 mcg into the muscle every 30 (thirty) days.    diphenhydramine-acetaminophen 25-500 MG Tabs tablet Commonly known as: TYLENOL PM Take 1 tablet by mouth at bedtime as needed (sleep).   empagliflozin 10 MG Tabs tablet Commonly known as: JARDIANCE Take 1 tablet (10 mg total) by mouth daily.   insulin NPH-regular Human (70-30) 100 UNIT/ML injection Inject 40 Units into the skin in the morning and at bedtime.   levocetirizine 5 MG tablet Commonly known as: XYZAL Take 5 mg by mouth at bedtime.   metoprolol succinate 25 MG 24 hr tablet Commonly known as: TOPROL-XL Take 1 tablet (25 mg total) by mouth 2 (two) times daily.   nitroGLYCERIN 0.4 MG SL tablet Commonly known as: NITROSTAT DISSOLVE 1 TABLET SUBLINGUALLY AS NEEDED FOR CHEST PAIN, MAY REPEAT EVERY 5 MINUTES. AFTER 3 CALL 911.   octreotide 100 MCG/ML Soln injection Commonly known as: SANDOSTATIN Inject 1 mL (100 mcg total) into the skin every 12 (twelve) hours.   pantoprazole 40 MG  tablet Commonly known as: Protonix Take 1 tablet (40 mg total) by mouth 2 (two) times daily.   pregabalin 25 MG capsule Commonly known as: LYRICA Take 25 mg by mouth 2 (two) times daily.   rosuvastatin 40 MG tablet Commonly known as: CRESTOR Take 1 tablet (40 mg total) by mouth daily.   spironolactone 25 MG tablet Commonly known as: ALDACTONE Take 0.5 tablets (12.5 mg total) by mouth daily.   traMADol 50 MG tablet Commonly known as: ULTRAM Take 50-100 mg by mouth in the morning and at bedtime.   zolpidem 5 MG tablet Commonly known as: AMBIEN Take 5 mg by mouth at bedtime as needed for sleep.        Discharge Exam: Filed Weights   11/09/23 1811 11/10/23 1500 11/11/23 0500  Weight: 125.6 kg 127 kg 118.3 kg        General:  AAO x 3,  cooperative, no distress;   HEENT:  Normocephalic, PERRL, otherwise with in Normal limits   Neuro:  CNII-XII intact. , normal motor and sensation, reflexes intact   Lungs:   Clear to auscultation BL, Respirations unlabored,  No wheezes /  crackles  Cardio:    S1/S2, RRR, No murmure, No Rubs or Gallops   Abdomen:  Soft, non-tender, bowel sounds active all four quadrants, no guarding or peritoneal signs.  Muscular  skeletal:  Limited exam -global generalized weaknesses - in bed, able to move all 4 extremities,   2+ pulses,  symmetric, No pitting edema  Skin:  Dry, warm to touch, negative for any Rashes,  Wounds: Please see nursing documentation          Condition at discharge: good  The results of significant diagnostics from this hospitalization (including imaging, microbiology, ancillary and laboratory) are listed below for reference.   Imaging Studies: DG Chest Port 1 View Result Date: 11/09/2023 CLINICAL DATA:  Shortness of breath, cough EXAM: PORTABLE CHEST 1 VIEW COMPARISON:  09/25/2023 FINDINGS: Heart and mediastinal contours are within normal limits. No focal opacities or effusions. No acute bony abnormality. IMPRESSION: No active disease. Electronically Signed   By: Charlett Nose M.D.   On: 11/09/2023 22:26    Microbiology: Results for orders placed or performed during the hospital encounter of 11/09/23  MRSA Next Gen by PCR, Nasal     Status: None   Collection Time: 11/10/23  2:59 PM   Specimen: Nasal Mucosa; Nasal Swab  Result Value Ref Range Status   MRSA by PCR Next Gen NOT DETECTED NOT DETECTED Final    Comment: (NOTE) The GeneXpert MRSA Assay (FDA approved for NASAL specimens only), is one component of a comprehensive MRSA colonization surveillance program. It is not intended to diagnose MRSA infection nor to guide or monitor treatment for MRSA infections. Test performance is not FDA approved in patients less than 47 years old. Performed at [facility], [address] [place], [place] [address]     Labs: CBC: Recent Labs  Lab 11/09/23 1918 11/10/23 0442 11/10/23 1803 11/11/23 0015 11/11/23 0838  WBC 10.2 11.2*  --   --  10.7*  HGB 5.2* 5.2* 7.6* 7.9* 8.1*  HCT 20.1* 20.0* 26.6* 28.0*  28.1*  MCV 79.1* 77.5*  --   --  77.4*  PLT 174 173  --   --  177   Basic Metabolic Panel: Recent Labs  Lab 11/09/23 1918 11/10/23 0442  NA 136 136  K 4.5 4.1  CL 100 102  CO2 26 26  GLUCOSE 220* 158*  BUN 20 20  CREATININE 1.15 1.07  CALCIUM 9.1 9.1  MG  --  1.9  PHOS  --  2.1*   Liver Function Tests: Recent Labs  Lab 11/09/23 1918 11/10/23 0442  AST 11* 10*  ALT 8 8  ALKPHOS 63 57  BILITOT 0.5 0.5  PROT 6.7 6.7  ALBUMIN 3.3* 3.3*   CBG: Recent Labs  Lab 11/10/23 1931 11/10/23 2322 11/11/23 0435 11/11/23 0732 11/11/23 1104  GLUCAP 187* 157* 150* 152* 232*    Discharge time spent: greater than 30 minutes.  Signed: Kendell Bane, MD [facility] 11/11/2023

## 2023-11-11 NOTE — Progress Notes (Signed)
 Mobility Specialist Progress Note:    11/11/23 1120  Mobility  Activity Transferred from bed to chair  Level of Assistance Contact guard assist, steadying assist  Assistive Device None  Distance Ambulated (ft) 5 ft  Range of Motion/Exercises Active;All extremities  Activity Response Tolerated well  Mobility Referral Yes  Mobility visit 1 Mobility  Mobility Specialist Start Time (ACUTE ONLY) 1110  Mobility Specialist Stop Time (ACUTE ONLY) 1120  Mobility Specialist Time Calculation (min) (ACUTE ONLY) 10 min   Pt received sitting EOB, requesting assistance to get up. Required CGA to stand and transfer with no AD. Tolerated well, asx throughout. Left pt in chair, alarm on. Call bell in reach, all needs met.   Lawerance Bach Mobility Specialist Please contact via Special educational needs teacher or  Rehab office at [phone]

## 2023-11-12 ENCOUNTER — Other Ambulatory Visit (HOSPITAL_COMMUNITY): Payer: PPO

## 2023-11-18 ENCOUNTER — Other Ambulatory Visit (HOSPITAL_COMMUNITY): Payer: Self-pay

## 2023-11-19 ENCOUNTER — Encounter (HOSPITAL_COMMUNITY): Payer: Self-pay | Admitting: Hematology

## 2023-11-19 ENCOUNTER — Other Ambulatory Visit (HOSPITAL_COMMUNITY): Payer: Self-pay

## 2023-11-19 ENCOUNTER — Other Ambulatory Visit: Payer: Self-pay | Admitting: Gastroenterology

## 2023-11-19 MED ORDER — SYRINGE 25G X 5/8" 3 ML MISC
1.0000 mL | Freq: Two times a day (BID) | 3 refills | Status: AC
  Filled 2023-11-19: qty 100, 50d supply, fill #0

## 2023-11-19 MED ORDER — OCTREOTIDE ACETATE 100 MCG/ML IJ SOLN
100.0000 ug | Freq: Two times a day (BID) | INTRAMUSCULAR | 1 refills | Status: AC
  Filled 2023-11-19: qty 90, 45d supply, fill #0

## 2023-11-24 ENCOUNTER — Other Ambulatory Visit (HOSPITAL_COMMUNITY): Payer: Self-pay

## 2023-11-24 ENCOUNTER — Telehealth: Payer: Self-pay | Admitting: *Deleted

## 2023-11-24 DIAGNOSIS — D509 Iron deficiency anemia, unspecified: Secondary | ICD-10-CM | POA: Diagnosis not present

## 2023-11-24 DIAGNOSIS — J9611 Chronic respiratory failure with hypoxia: Secondary | ICD-10-CM | POA: Diagnosis not present

## 2023-11-24 DIAGNOSIS — K5900 Constipation, unspecified: Secondary | ICD-10-CM | POA: Diagnosis not present

## 2023-11-24 DIAGNOSIS — E782 Mixed hyperlipidemia: Secondary | ICD-10-CM | POA: Diagnosis not present

## 2023-11-24 DIAGNOSIS — E1142 Type 2 diabetes mellitus with diabetic polyneuropathy: Secondary | ICD-10-CM | POA: Diagnosis not present

## 2023-11-24 DIAGNOSIS — D6949 Other primary thrombocytopenia: Secondary | ICD-10-CM | POA: Diagnosis not present

## 2023-11-24 DIAGNOSIS — J449 Chronic obstructive pulmonary disease, unspecified: Secondary | ICD-10-CM | POA: Diagnosis not present

## 2023-11-24 DIAGNOSIS — I5042 Chronic combined systolic (congestive) and diastolic (congestive) heart failure: Secondary | ICD-10-CM | POA: Diagnosis not present

## 2023-11-24 DIAGNOSIS — I3139 Other pericardial effusion (noninflammatory): Secondary | ICD-10-CM | POA: Diagnosis not present

## 2023-11-24 DIAGNOSIS — I110 Hypertensive heart disease with heart failure: Secondary | ICD-10-CM | POA: Diagnosis not present

## 2023-11-24 DIAGNOSIS — R944 Abnormal results of kidney function studies: Secondary | ICD-10-CM | POA: Diagnosis not present

## 2023-11-24 DIAGNOSIS — K922 Gastrointestinal hemorrhage, unspecified: Secondary | ICD-10-CM | POA: Diagnosis not present

## 2023-11-24 NOTE — Telephone Encounter (Signed)
 Provider from Dr. Scharlene Gloss office called and states pt said he was not taking the Octreotide, because it was to expensive. He wanted to know if we had any other alternatives. Tammy C. Called [employer] and it was approved, but it is 200$ a month and pt can not afford that. Not sure if there is pt assistance or not.

## 2023-11-25 NOTE — Telephone Encounter (Signed)
 Completed paperwork for patient assistance. Please keep me updated on this.

## 2023-11-25 NOTE — Telephone Encounter (Signed)
Noted. It was faxed  ?

## 2023-11-26 ENCOUNTER — Ambulatory Visit (HOSPITAL_COMMUNITY)
Admission: RE | Admit: 2023-11-26 | Discharge: 2023-11-26 | Disposition: A | Discharge status: Home / Self Care | Payer: PPO | Source: Ambulatory Visit | Attending: Internal Medicine | Admitting: Internal Medicine

## 2023-11-26 DIAGNOSIS — I3139 Other pericardial effusion (noninflammatory): Secondary | ICD-10-CM | POA: Diagnosis not present

## 2023-11-26 LAB — ECHOCARDIOGRAM LIMITED
Area-P 1/2: 1.79 cm2
S' Lateral: 3.6 cm

## 2023-11-26 NOTE — Progress Notes (Signed)
*  PRELIMINARY RESULTS* Echocardiogram Limited 2-D Echocardiogram  has been performed.  Gregory Alvarado 11/26/2023, 3:54 PM

## 2023-11-30 ENCOUNTER — Other Ambulatory Visit: Payer: Self-pay | Admitting: Cardiology

## 2023-11-30 ENCOUNTER — Other Ambulatory Visit (HOSPITAL_COMMUNITY): Payer: Self-pay

## 2023-12-05 DIAGNOSIS — J449 Chronic obstructive pulmonary disease, unspecified: Secondary | ICD-10-CM | POA: Diagnosis not present

## 2023-12-15 ENCOUNTER — Encounter: Payer: Self-pay | Admitting: Gastroenterology

## 2023-12-16 ENCOUNTER — Telehealth: Payer: Self-pay | Admitting: Cardiology

## 2023-12-16 MED ORDER — METOPROLOL SUCCINATE ER 25 MG PO TB24: 25.0000 mg | ORAL_TABLET | Freq: Two times a day (BID) | ORAL | 3 refills | Status: AC

## 2023-12-16 NOTE — Telephone Encounter (Signed)
 Medication refill request approved and sent to [facility] per pt's request.

## 2023-12-16 NOTE — Telephone Encounter (Signed)
*  STAT* If patient is at the pharmacy, call can be transferred to refill team.   1. Which medications need to be refilled? (please list name of each medication and dose if known) metoprolol succinate (TOPROL-XL) 25 MG 24 hr tablet   2. Which pharmacy/location (including street and city if local pharmacy) is medication to be sent to?  [facility] - [place], [place] - [address]    3. Do they need a 30 day or 90 day supply? 90 pt has been out for 3 days

## 2023-12-23 DIAGNOSIS — M545 Low back pain, unspecified: Secondary | ICD-10-CM | POA: Diagnosis not present

## 2023-12-23 DIAGNOSIS — D509 Iron deficiency anemia, unspecified: Secondary | ICD-10-CM | POA: Diagnosis not present

## 2023-12-23 DIAGNOSIS — E782 Mixed hyperlipidemia: Secondary | ICD-10-CM | POA: Diagnosis not present

## 2023-12-23 DIAGNOSIS — I3139 Other pericardial effusion (noninflammatory): Secondary | ICD-10-CM | POA: Diagnosis not present

## 2023-12-23 DIAGNOSIS — K922 Gastrointestinal hemorrhage, unspecified: Secondary | ICD-10-CM | POA: Diagnosis not present

## 2023-12-23 DIAGNOSIS — D6949 Other primary thrombocytopenia: Secondary | ICD-10-CM | POA: Diagnosis not present

## 2023-12-23 DIAGNOSIS — I110 Hypertensive heart disease with heart failure: Secondary | ICD-10-CM | POA: Diagnosis not present

## 2023-12-23 DIAGNOSIS — R944 Abnormal results of kidney function studies: Secondary | ICD-10-CM | POA: Diagnosis not present

## 2023-12-23 DIAGNOSIS — J449 Chronic obstructive pulmonary disease, unspecified: Secondary | ICD-10-CM | POA: Diagnosis not present

## 2023-12-23 DIAGNOSIS — J9611 Chronic respiratory failure with hypoxia: Secondary | ICD-10-CM | POA: Diagnosis not present

## 2023-12-23 DIAGNOSIS — E538 Deficiency of other specified B group vitamins: Secondary | ICD-10-CM | POA: Diagnosis not present

## 2023-12-23 DIAGNOSIS — I5042 Chronic combined systolic (congestive) and diastolic (congestive) heart failure: Secondary | ICD-10-CM | POA: Diagnosis not present

## 2023-12-23 DIAGNOSIS — E1169 Type 2 diabetes mellitus with other specified complication: Secondary | ICD-10-CM | POA: Diagnosis not present

## 2023-12-23 DIAGNOSIS — F17210 Nicotine dependence, cigarettes, uncomplicated: Secondary | ICD-10-CM | POA: Diagnosis not present

## 2023-12-25 ENCOUNTER — Other Ambulatory Visit: Payer: Self-pay

## 2023-12-25 ENCOUNTER — Encounter (HOSPITAL_COMMUNITY): Payer: Self-pay | Admitting: Emergency Medicine

## 2023-12-25 ENCOUNTER — Observation Stay (HOSPITAL_COMMUNITY)
Admission: EM | Admit: 2023-12-25 | Discharge: 2023-12-26 | Disposition: A | Discharge status: Home / Self Care | Attending: Family Medicine | Admitting: Family Medicine

## 2023-12-25 DIAGNOSIS — E1122 Type 2 diabetes mellitus with diabetic chronic kidney disease: Secondary | ICD-10-CM | POA: Insufficient documentation

## 2023-12-25 DIAGNOSIS — G8929 Other chronic pain: Secondary | ICD-10-CM | POA: Diagnosis not present

## 2023-12-25 DIAGNOSIS — I10 Essential (primary) hypertension: Secondary | ICD-10-CM | POA: Diagnosis present

## 2023-12-25 DIAGNOSIS — R195 Other fecal abnormalities: Secondary | ICD-10-CM | POA: Diagnosis present

## 2023-12-25 DIAGNOSIS — I998 Other disorder of circulatory system: Secondary | ICD-10-CM | POA: Diagnosis present

## 2023-12-25 DIAGNOSIS — D62 Acute posthemorrhagic anemia: Secondary | ICD-10-CM | POA: Diagnosis present

## 2023-12-25 DIAGNOSIS — D509 Iron deficiency anemia, unspecified: Secondary | ICD-10-CM | POA: Diagnosis present

## 2023-12-25 DIAGNOSIS — I130 Hypertensive heart and chronic kidney disease with heart failure and stage 1 through stage 4 chronic kidney disease, or unspecified chronic kidney disease: Secondary | ICD-10-CM | POA: Diagnosis not present

## 2023-12-25 DIAGNOSIS — D500 Iron deficiency anemia secondary to blood loss (chronic): Principal | ICD-10-CM | POA: Insufficient documentation

## 2023-12-25 DIAGNOSIS — E1165 Type 2 diabetes mellitus with hyperglycemia: Secondary | ICD-10-CM | POA: Diagnosis not present

## 2023-12-25 DIAGNOSIS — N182 Chronic kidney disease, stage 2 (mild): Secondary | ICD-10-CM | POA: Diagnosis present

## 2023-12-25 DIAGNOSIS — J9601 Acute respiratory failure with hypoxia: Secondary | ICD-10-CM | POA: Insufficient documentation

## 2023-12-25 DIAGNOSIS — D649 Anemia, unspecified: Secondary | ICD-10-CM | POA: Diagnosis not present

## 2023-12-25 DIAGNOSIS — R1314 Dysphagia, pharyngoesophageal phase: Secondary | ICD-10-CM | POA: Diagnosis not present

## 2023-12-25 DIAGNOSIS — R799 Abnormal finding of blood chemistry, unspecified: Secondary | ICD-10-CM | POA: Diagnosis present

## 2023-12-25 DIAGNOSIS — F17210 Nicotine dependence, cigarettes, uncomplicated: Secondary | ICD-10-CM | POA: Insufficient documentation

## 2023-12-25 DIAGNOSIS — Z91199 Patient's noncompliance with other medical treatment and regimen due to unspecified reason: Secondary | ICD-10-CM | POA: Diagnosis not present

## 2023-12-25 DIAGNOSIS — K922 Gastrointestinal hemorrhage, unspecified: Principal | ICD-10-CM | POA: Insufficient documentation

## 2023-12-25 DIAGNOSIS — I2510 Atherosclerotic heart disease of native coronary artery without angina pectoris: Secondary | ICD-10-CM | POA: Diagnosis present

## 2023-12-25 DIAGNOSIS — J9611 Chronic respiratory failure with hypoxia: Secondary | ICD-10-CM | POA: Diagnosis present

## 2023-12-25 DIAGNOSIS — K219 Gastro-esophageal reflux disease without esophagitis: Secondary | ICD-10-CM | POA: Diagnosis present

## 2023-12-25 DIAGNOSIS — Z794 Long term (current) use of insulin: Secondary | ICD-10-CM | POA: Diagnosis not present

## 2023-12-25 DIAGNOSIS — J449 Chronic obstructive pulmonary disease, unspecified: Secondary | ICD-10-CM | POA: Diagnosis not present

## 2023-12-25 DIAGNOSIS — Z79899 Other long term (current) drug therapy: Secondary | ICD-10-CM | POA: Insufficient documentation

## 2023-12-25 DIAGNOSIS — I2089 Other forms of angina pectoris: Secondary | ICD-10-CM | POA: Diagnosis present

## 2023-12-25 DIAGNOSIS — E66812 Obesity, class 2: Secondary | ICD-10-CM | POA: Diagnosis present

## 2023-12-25 DIAGNOSIS — E785 Hyperlipidemia, unspecified: Secondary | ICD-10-CM | POA: Diagnosis not present

## 2023-12-25 DIAGNOSIS — Z9889 Other specified postprocedural states: Secondary | ICD-10-CM | POA: Insufficient documentation

## 2023-12-25 DIAGNOSIS — R1319 Other dysphagia: Secondary | ICD-10-CM | POA: Diagnosis present

## 2023-12-25 DIAGNOSIS — I5042 Chronic combined systolic (congestive) and diastolic (congestive) heart failure: Secondary | ICD-10-CM | POA: Diagnosis present

## 2023-12-25 DIAGNOSIS — Z6835 Body mass index (BMI) 35.0-35.9, adult: Secondary | ICD-10-CM | POA: Diagnosis not present

## 2023-12-25 DIAGNOSIS — E782 Mixed hyperlipidemia: Secondary | ICD-10-CM | POA: Diagnosis present

## 2023-12-25 DIAGNOSIS — Z720 Tobacco use: Secondary | ICD-10-CM | POA: Diagnosis not present

## 2023-12-25 LAB — COMPREHENSIVE METABOLIC PANEL WITH GFR
ALT: 9 U/L (ref 0–44)
AST: 14 U/L — ABNORMAL LOW (ref 15–41)
Albumin: 3.3 g/dL — ABNORMAL LOW (ref 3.5–5.0)
Alkaline Phosphatase: 72 U/L (ref 38–126)
Anion gap: 9 (ref 5–15)
BUN: 17 mg/dL (ref 8–23)
CO2: 27 mmol/L (ref 22–32)
Calcium: 9.1 mg/dL (ref 8.9–10.3)
Chloride: 102 mmol/L (ref 98–111)
Creatinine, Ser: 1.26 mg/dL — ABNORMAL HIGH (ref 0.61–1.24)
GFR, Estimated: >60 mL/min (ref >60)
Glucose, Bld: 253 mg/dL — ABNORMAL HIGH (ref 70–99)
Potassium: 4.2 mmol/L (ref 3.5–5.1)
Sodium: 138 mmol/L (ref 135–145)
Total Bilirubin: 0.3 mg/dL (ref 0.0–1.2)
Total Protein: 7.1 g/dL (ref 6.5–8.1)

## 2023-12-25 LAB — FOLATE: Folate: 6.7 ng/mL (ref >5.9)

## 2023-12-25 LAB — IRON AND TIBC
Iron: 16 ug/dL — ABNORMAL LOW (ref 45–182)
Saturation Ratios: 3 % — ABNORMAL LOW (ref 17.9–39.5)
TIBC: 525 ug/dL — ABNORMAL HIGH (ref 250–450)
UIBC: 509 ug/dL

## 2023-12-25 LAB — CBC
HCT: 25 % — ABNORMAL LOW (ref 39.0–52.0)
Hemoglobin: 6.2 g/dL — CL (ref 13.0–17.0)
MCH: 20.2 pg — ABNORMAL LOW (ref 26.0–34.0)
MCHC: 24.8 g/dL — ABNORMAL LOW (ref 30.0–36.0)
MCV: 81.4 fL (ref 80.0–100.0)
Platelets: 184 10*3/uL (ref 150–400)
RBC: 3.07 MIL/uL — ABNORMAL LOW (ref 4.22–5.81)
RDW: 19.4 % — ABNORMAL HIGH (ref 11.5–15.5)
WBC: 11.6 10*3/uL — ABNORMAL HIGH (ref 4.0–10.5)
nRBC: 0.2 % (ref 0.0–0.2)

## 2023-12-25 LAB — RETICULOCYTES
Immature Retic Fract: 45.9 % — ABNORMAL HIGH (ref 2.3–15.9)
RBC.: 3.07 MIL/uL — ABNORMAL LOW (ref 4.22–5.81)
Retic Count, Absolute: 123.4 10*3/uL (ref 19.0–186.0)
Retic Ct Pct: 4 % — ABNORMAL HIGH (ref 0.4–3.1)

## 2023-12-25 LAB — GLUCOSE, CAPILLARY
Glucose-Capillary: 201 mg/dL — ABNORMAL HIGH (ref 70–99)
Glucose-Capillary: 202 mg/dL — ABNORMAL HIGH (ref 70–99)
Glucose-Capillary: 57 mg/dL — ABNORMAL LOW (ref 70–99)
Glucose-Capillary: 66 mg/dL — ABNORMAL LOW (ref 70–99)
Glucose-Capillary: 76 mg/dL (ref 70–99)

## 2023-12-25 LAB — PREPARE RBC (CROSSMATCH)

## 2023-12-25 LAB — HEMOGLOBIN A1C
Hgb A1c MFr Bld: 6.9 % — ABNORMAL HIGH (ref 4.8–5.6)
Mean Plasma Glucose: 151.33 mg/dL

## 2023-12-25 LAB — VITAMIN B12: Vitamin B-12: 288 pg/mL (ref 180–914)

## 2023-12-25 LAB — FERRITIN: Ferritin: 6 ng/mL — ABNORMAL LOW (ref 24–336)

## 2023-12-25 LAB — POC OCCULT BLOOD, ED: Fecal Occult Bld: POSITIVE — AB

## 2023-12-25 MED ORDER — FENTANYL CITRATE PF 50 MCG/ML IJ SOSY: 12.5000 ug | PREFILLED_SYRINGE | INTRAMUSCULAR | Status: DC | PRN

## 2023-12-25 MED ORDER — SODIUM CHLORIDE 0.9 % IV SOLN
100.0000 mg | Freq: Once | INTRAVENOUS | Status: AC
  Administered 2023-12-25: 100 mg via INTRAVENOUS
  Filled 2023-12-25: qty 5

## 2023-12-25 MED ORDER — ONDANSETRON HCL 4 MG PO TABS: 4.0000 mg | ORAL_TABLET | Freq: Four times a day (QID) | ORAL | Status: DC | PRN

## 2023-12-25 MED ORDER — SPIRONOLACTONE 12.5 MG HALF TABLET
12.5000 mg | ORAL_TABLET | Freq: Every day | ORAL | Status: DC
Start: 2023-12-25 — End: 2023-12-26
  Administered 2023-12-25 – 2023-12-26 (×2): 12.5 mg via ORAL
  Filled 2023-12-25 (×2): qty 1

## 2023-12-25 MED ORDER — INSULIN ASPART 100 UNIT/ML IJ SOLN
0.0000 [IU] | Freq: Three times a day (TID) | INTRAMUSCULAR | Status: DC
  Administered 2023-12-25 – 2023-12-26 (×3): 5 [IU] via SUBCUTANEOUS
  Administered 2023-12-26: 3 [IU] via SUBCUTANEOUS

## 2023-12-25 MED ORDER — PROCHLORPERAZINE EDISYLATE 10 MG/2ML IJ SOLN: 10.0000 mg | INTRAMUSCULAR | Status: DC | PRN

## 2023-12-25 MED ORDER — OXYCODONE HCL 5 MG PO TABS
5.0000 mg | ORAL_TABLET | Freq: Four times a day (QID) | ORAL | Status: DC | PRN
  Administered 2023-12-25 (×2): 5 mg via ORAL
  Filled 2023-12-25 (×2): qty 1

## 2023-12-25 MED ORDER — ONDANSETRON HCL 4 MG/2ML IJ SOLN: 4.0000 mg | Freq: Four times a day (QID) | INTRAMUSCULAR | Status: DC | PRN

## 2023-12-25 MED ORDER — OCTREOTIDE ACETATE 100 MCG/ML IJ SOLN
100.0000 ug | Freq: Once | INTRAMUSCULAR | Status: AC
  Administered 2023-12-25: 100 ug via SUBCUTANEOUS
  Filled 2023-12-25: qty 1

## 2023-12-25 MED ORDER — SODIUM CHLORIDE 0.9% IV SOLUTION: Freq: Once | INTRAVENOUS | Status: AC

## 2023-12-25 MED ORDER — BUDESON-GLYCOPYRROL-FORMOTEROL 160-9-4.8 MCG/ACT IN AERO
2.0000 | INHALATION_SPRAY | Freq: Two times a day (BID) | RESPIRATORY_TRACT | Status: DC
  Administered 2023-12-25 – 2023-12-26 (×2): 2 via RESPIRATORY_TRACT
  Filled 2023-12-25: qty 5.9

## 2023-12-25 MED ORDER — ORAL CARE MOUTH RINSE: 15.0000 mL | OROMUCOSAL | Status: DC | PRN

## 2023-12-25 MED ORDER — EMPAGLIFLOZIN 10 MG PO TABS
10.0000 mg | ORAL_TABLET | Freq: Every day | ORAL | Status: DC
  Administered 2023-12-25 – 2023-12-26 (×2): 10 mg via ORAL
  Filled 2023-12-25 (×2): qty 1

## 2023-12-25 MED ORDER — NITROGLYCERIN 0.4 MG SL SUBL: 0.4000 mg | SUBLINGUAL_TABLET | SUBLINGUAL | Status: DC | PRN

## 2023-12-25 MED ORDER — METOPROLOL SUCCINATE ER 25 MG PO TB24
25.0000 mg | ORAL_TABLET | Freq: Two times a day (BID) | ORAL | Status: DC
  Administered 2023-12-25 – 2023-12-26 (×3): 25 mg via ORAL
  Filled 2023-12-25 (×3): qty 1

## 2023-12-25 MED ORDER — ACETAMINOPHEN 650 MG RE SUPP: 650.0000 mg | Freq: Four times a day (QID) | RECTAL | Status: DC | PRN

## 2023-12-25 MED ORDER — PANTOPRAZOLE SODIUM 40 MG IV SOLR
80.0000 mg | Freq: Once | INTRAVENOUS | Status: AC
  Administered 2023-12-25: 80 mg via INTRAVENOUS
  Filled 2023-12-25: qty 20

## 2023-12-25 MED ORDER — ROSUVASTATIN CALCIUM 20 MG PO TABS
40.0000 mg | ORAL_TABLET | Freq: Every day | ORAL | Status: DC
  Administered 2023-12-25: 40 mg via ORAL
  Filled 2023-12-25: qty 2

## 2023-12-25 MED ORDER — INSULIN ASPART PROT & ASPART (70-30 MIX) 100 UNIT/ML ~~LOC~~ SUSP
20.0000 [IU] | Freq: Two times a day (BID) | SUBCUTANEOUS | Status: DC
  Administered 2023-12-25: 20 [IU] via SUBCUTANEOUS
  Filled 2023-12-25: qty 10

## 2023-12-25 MED ORDER — LORATADINE 10 MG PO TABS
10.0000 mg | ORAL_TABLET | Freq: Every day | ORAL | Status: DC
  Administered 2023-12-25: 10 mg via ORAL
  Filled 2023-12-25: qty 1

## 2023-12-25 MED ORDER — TRAZODONE HCL 50 MG PO TABS
25.0000 mg | ORAL_TABLET | Freq: Every evening | ORAL | Status: DC | PRN
  Administered 2023-12-25: 25 mg via ORAL
  Filled 2023-12-25: qty 1

## 2023-12-25 MED ORDER — ACETAMINOPHEN 325 MG PO TABS
650.0000 mg | ORAL_TABLET | Freq: Four times a day (QID) | ORAL | Status: DC | PRN
  Administered 2023-12-25 – 2023-12-26 (×2): 650 mg via ORAL
  Filled 2023-12-25 (×2): qty 2

## 2023-12-25 MED ORDER — LEVOCETIRIZINE DIHYDROCHLORIDE 5 MG PO TABS: 5.0000 mg | ORAL_TABLET | Freq: Every day | ORAL | Status: DC

## 2023-12-25 MED ORDER — PANTOPRAZOLE SODIUM 40 MG PO TBEC
40.0000 mg | DELAYED_RELEASE_TABLET | Freq: Two times a day (BID) | ORAL | Status: DC
Start: 2023-12-25 — End: 2023-12-26
  Administered 2023-12-25 – 2023-12-26 (×2): 40 mg via ORAL
  Filled 2023-12-25 (×2): qty 1

## 2023-12-25 MED ORDER — PREGABALIN 25 MG PO CAPS
25.0000 mg | ORAL_CAPSULE | Freq: Two times a day (BID) | ORAL | Status: DC
  Administered 2023-12-25 – 2023-12-26 (×2): 25 mg via ORAL
  Filled 2023-12-25 (×2): qty 1

## 2023-12-25 NOTE — ED Notes (Signed)
 ED TO INPATIENT HANDOFF REPORT  ED Nurse Name and Phone #: Autry Boas Name/Age/Gender Gregory Alvarado 73 y.o. male Room/Bed: APA19/APA19  Code Status   Code Status: Prior  Home/SNF/Other Home Patient oriented to: self, place, time, and situation Is this baseline? Yes   Triage Complete: Triage complete  Chief Complaint Acute on chronic blood loss anemia [D62]  Triage Note Pt reports low Hgb of 6.1. Denies pain, lightheadedness, or new SHOB. Pt on chronic O2 at 2L.   Allergies Allergies  Allergen Reactions   Lisinopril Swelling    ACEi angioedema requiring intubation 07/2022   Caduet [Amlodipine-Atorvastatin] Swelling    Level of Care/Admitting Diagnosis ED Disposition     ED Disposition  Admit   Condition  --   Comment  Hospital Area: [facility] [[id number]]  Level of Care: Telemetry [5]  Covid Evaluation: Asymptomatic - no recent exposure (last 10 days) testing not required  Diagnosis: Acute on chronic blood loss anemia [8119147]  Admitting Physician: [name] [[id number]]  Attending Physician: [name] [[id number]]          B Medical/Surgery History Past Medical History:  Diagnosis Date   Anemia    Arthritis    left shoulder   COPD (chronic obstructive pulmonary disease) (HCC)    Coronary artery disease 09/11/2020   [facility]   Diabetes mellitus without complication (HCC)    Dyslipidemia    Hypertension    Insomnia    Insomnia    Morbid obesity (HCC)    Tobacco use    Past Surgical History:  Procedure Laterality Date   BIOPSY  10/28/2019   Procedure: BIOPSY;  Surgeon: Alyce Jubilee, MD;  [facility];  Service: Endoscopy;;  gastric   BIOPSY  04/15/2020   Procedure: BIOPSY;  Surgeon: Suzette Espy, MD;  [facility];  Service: Endoscopy;;  gastric   CARDIAC CATHETERIZATION  08/2020   3 stents placed   COLON RESECTION  1990s?   DIVERTICULITIS   COLONOSCOPY WITH PROPOFOL N/A  04/15/2020   Gregory Alvarado: Poor colon prep. two 6 to 8 mm polyps removed from the hepatic flexure, tubular adenomas.  Repeat colonoscopy later this year.   ENTEROSCOPY N/A 11/26/2021   Procedure: ENTEROSCOPY;  Surgeon: Urban Garden, MD;  [facility];  Service: Gastroenterology;  Laterality: N/A;   ENTEROSCOPY N/A 03/13/2022   Procedure: ENTEROSCOPY;  Surgeon: Vinetta Greening, DO;  [facility];  Service: Endoscopy;  Laterality: N/A;   ENTEROSCOPY N/A 09/28/2023   Procedure: ENTEROSCOPY;  Surgeon: Urban Garden, MD;  [facility];  Service: Gastroenterology;  Laterality: N/A;   ESOPHAGOGASTRODUODENOSCOPY (EGD) WITH PROPOFOL N/A 10/28/2019   Dr. Nolene Baumgarten: Multiple cratered gastric ulcers, gastritis/duodenitis.  Path showed H. pylori.  Patient treated with amoxicillin/Biaxin/PPI twice daily.   ESOPHAGOGASTRODUODENOSCOPY (EGD) WITH PROPOFOL N/A 04/15/2020   Gregory Alvarado: Patchy gastric erythema status post biopsy to document eradication of H. pylori.  Biopsy showed persistent H. pylori.  Previously noted gastric ulcer is completely healed.   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   ESOPHAGOGASTRODUODENOSCOPY (EGD) WITH PROPOFOL N/A 03/13/2022   Procedure: ESOPHAGOGASTRODUODENOSCOPY (EGD) WITH PROPOFOL;  Surgeon: Vinetta Greening, DO;  [facility];  Service: Endoscopy;  Laterality: N/A;   HERNIA REPAIR     UHR   HOT HEMOSTASIS  11/26/2021  Procedure: HOT HEMOSTASIS (ARGON PLASMA COAGULATION/BICAP);  Surgeon: Umberto Ganong, Bearl Limes, MD;  [facility];  Service: Gastroenterology;;   HOT HEMOSTASIS  09/28/2023   Procedure: HOT HEMOSTASIS (ARGON PLASMA COAGULATION/BICAP);  Surgeon: Umberto Ganong, Bearl Limes, MD;  [facility];  Service: Gastroenterology;;   Londa Rival DILATION N/A 04/15/2020   Procedure: Dorothyann Gather;   Surgeon: Suzette Espy, MD;  [facility];  Service: Endoscopy;  Laterality: N/A;   POLYPECTOMY  04/15/2020   Procedure: POLYPECTOMY;  Surgeon: Suzette Espy, MD;  [facility];  Service: Endoscopy;;  colon   ROTATOR CUFF REPAIR Bilateral    TYMPANOPLASTY       A IV Location/Drains/Wounds Patient Lines/Drains/Airways Status     Active Line/Drains/Airways     Name Placement date Placement time Site Days   Peripheral IV 12/25/23 22 G Anterior;Left Forearm 12/25/23  0958  Forearm  less than 1            Intake/Output Last 24 hours No intake or output data in the 24 hours ending 12/25/23 1133  Labs/Imaging Results for orders placed or performed during the hospital encounter of 12/25/23 (from the past 48 hours)  CBC     Status: Abnormal   Collection Time: 12/25/23  9:37 AM  Result Value Ref Range   WBC 11.6 (H) 4.0 - 10.5 K/uL   RBC 3.07 (L) 4.22 - 5.81 MIL/uL   Hemoglobin 6.2 (LL) 13.0 - 17.0 g/dL    Comment: REPEATED TO VERIFY THIS CRITICAL RESULT HAS VERIFIED AND BEEN CALLED TO M DOSS BY SHANA DALTON ON 04 12 2025 AT 1011, AND HAS BEEN READ BACK.     HCT 25.0 (L) 39.0 - 52.0 %   MCV 81.4 80.0 - 100.0 fL   MCH 20.2 (L) 26.0 - 34.0 pg   MCHC 24.8 (L) 30.0 - 36.0 g/dL   RDW 56.2 (H) 13.0 - 86.5 %   Platelets 184 150 - 400 K/uL   nRBC 0.2 0.0 - 0.2 %    Comment: Performed at [facility], [address] [place], [place] [address]  Comprehensive metabolic panel     Status: Abnormal   Collection Time: 12/25/23  9:37 AM  Result Value Ref Range   Sodium 138 135 - 145 mmol/L   Potassium 4.2 3.5 - 5.1 mmol/L   Chloride 102 98 - 111 mmol/L   CO2 27 22 - 32 mmol/L   Glucose, Bld 253 (H) 70 - 99 mg/dL    Comment: Glucose reference range applies only to samples taken after fasting for at least 8 hours.   BUN 17 8 - 23 mg/dL   Creatinine, Ser 6.29 (H) 0.61 - 1.24 mg/dL   Calcium 9.1 8.9 - 52.8 mg/dL   Total Protein 7.1 6.5 - 8.1 g/dL   Albumin 3.3 (L)  3.5 - 5.0 g/dL   AST 14 (L) 15 - 41 U/L   ALT 9 0 - 44 U/L   Alkaline Phosphatase 72 38 - 126 U/L   Total Bilirubin 0.3 0.0 - 1.2 mg/dL   GFR, Estimated >41 >32 mL/min    Comment: (NOTE) Calculated using the CKD-EPI Creatinine Equation (2021)    Anion gap 9 5 - 15    Comment: Performed at [facility], [address], [place], [place] [address]  Type and screen [facility]     Status: None (Preliminary result)   Collection Time: 12/25/23  9:42 AM  Result Value Ref Range   ABO/RH(D)  A POS    Antibody Screen PENDING    Sample Expiration      12/28/2023,2359 Performed at [facility], [address], [place], [place] [address]   Prepare RBC (crossmatch)     Status: None   Collection Time: 12/25/23 10:17 AM  Result Value Ref Range   Order Confirmation      ORDER PROCESSED BY BLOOD BANK Performed at [facility], [address], [place], [place] [address]    No results found.  Pending Labs Unresulted Labs (From admission, onward)     Start     Ordered   12/25/23 1111  Vitamin B12  (Anemia Panel (PNL))  Once,   URGENT        12/25/23 1110   12/25/23 1111  Folate  (Anemia Panel (PNL))  Once,   URGENT        12/25/23 1110   12/25/23 1111  Iron and TIBC  (Anemia Panel (PNL))  Once,   URGENT        12/25/23 1110   12/25/23 1111  Ferritin  (Anemia Panel (PNL))  Once,   URGENT        12/25/23 1110   12/25/23 1111  Reticulocytes  (Anemia Panel (PNL))  Once,   URGENT        12/25/23 1110   12/25/23 1012  Occult blood card to lab, stool  Once,   URGENT        12/25/23 1011            Vitals/Pain Today's Vitals   12/25/23 0933 12/25/23 0934  BP:  (!) 126/45  Pulse:  98  Resp:  16  Temp:  97.9 F (36.6 C)  TempSrc:  Oral  SpO2:  100%  PainSc: 0-No pain     Isolation Precautions No active isolations  Medications Medications  0.9 %  sodium chloride infusion (Manually program via Guardrails IV Fluids) (has no administration in time range)  octreotide  (SANDOSTATIN) injection 100 mcg (has no administration in time range)  iron sucrose (VENOFER) 100 mg in sodium chloride 0.9 % 100 mL IVPB (has no administration in time range)  pantoprazole (PROTONIX) injection 80 mg (80 mg Intravenous Given 12/25/23 1101)    Mobility walks with device     Focused Assessments     R Recommendations: See Admitting Provider Note  Report given to:   Additional Notes:

## 2023-12-25 NOTE — ED Notes (Signed)
 Date and time results received: 12/25/23 1012 (use smartphrase ".now" to insert current time)  Test: Hgb 6.2 Critical Value: Hgb  Name of Provider Notified: Dr. Joli Neas  Orders Received? Or Actions Taken?: Blood ordered

## 2023-12-25 NOTE — H&P (Signed)
 History and Physical  [facility]  Gregory Alvarado [place] ZOX:[id number] DOB: Sep 26, 1950 DOA: 12/25/2023  PCP: Omie Bickers, MD  Patient coming from: Home  - sent by PCP office  Level of care: Telemetry  I have personally briefly reviewed patient's old medical records in 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  He was supposed to be taking octreotide subcu injections however due to the exorbitant cost of the medication he has not been able to afford it on an outpatient basis.  He reported feeling weak prompting his PCP to check a CBC. He says he was told his Hg was down to 6.1 and he needed to go to ED.  His low Hg was confirmed in ED with low iron stores noted.  He was given IV venofer infusion in ED.  He was given 1 dose of subcutaneous octreotide.  He was typed and screened for 2 units PRBC and admission was requested due to prolonged time required to crossmatch his blood with autoantibodies and subsequent high risk transfusion will need to be monitored.  He again tested hemoccult positive. GI was consulted by ED provider.     Past Medical History:  Diagnosis Date   Anemia    Arthritis    left shoulder   COPD (chronic obstructive pulmonary disease) (HCC)    Coronary artery disease 09/11/2020   NSTEMI and 3 stents in [facility]   Diabetes mellitus without complication (HCC)    Dyslipidemia     Hypertension    Insomnia    Insomnia    Morbid obesity (HCC)    Tobacco use     Past Surgical History:  Procedure Laterality Date   BIOPSY  10/28/2019   Procedure: BIOPSY;  Surgeon: Alyce Jubilee, MD;  [facility];  Service: Endoscopy;;  gastric   BIOPSY  04/15/2020   Procedure: BIOPSY;  Surgeon: Suzette Espy, MD;  [facility];  Service: Endoscopy;;  gastric   CARDIAC CATHETERIZATION  08/2020   3 stents placed   COLON RESECTION  1990s?   DIVERTICULITIS   COLONOSCOPY WITH PROPOFOL N/A 04/15/2020   Rourk: Poor colon prep. two 6 to 8 mm polyps removed from the hepatic flexure, tubular adenomas.  Repeat colonoscopy later this year.   ENTEROSCOPY N/A 11/26/2021   Procedure: ENTEROSCOPY;  Surgeon: Urban Garden, MD;  [facility];  Service: Gastroenterology;  Laterality: N/A;   ENTEROSCOPY N/A 03/13/2022   Procedure: ENTEROSCOPY;  Surgeon: Vinetta Greening, DO;  [facility];  Service: Endoscopy;  Laterality: N/A;   ENTEROSCOPY N/A October 07, 2023   Procedure: ENTEROSCOPY;  Surgeon: Urban Garden, MD;  [facility];  Service: Gastroenterology;  Laterality: N/A;   ESOPHAGOGASTRODUODENOSCOPY (EGD) WITH PROPOFOL N/A 10/28/2019   Dr. Nolene Baumgarten: Multiple cratered gastric ulcers, gastritis/duodenitis.  Path showed H. pylori.  Patient treated with amoxicillin/Biaxin/PPI twice daily.   ESOPHAGOGASTRODUODENOSCOPY (EGD) WITH PROPOFOL N/A 04/15/2020   Rourk: Patchy  gastric erythema status post biopsy to document eradication of H. pylori.  Biopsy showed persistent H. pylori.  Previously noted gastric ulcer is completely healed.   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   ESOPHAGOGASTRODUODENOSCOPY (EGD) WITH PROPOFOL N/A 03/13/2022   Procedure: ESOPHAGOGASTRODUODENOSCOPY (EGD) WITH PROPOFOL;  Surgeon: Vinetta Greening, DO;   [facility];  Service: Endoscopy;  Laterality: N/A;   HERNIA REPAIR     UHR   HOT HEMOSTASIS  11/26/2021   Procedure: HOT HEMOSTASIS (ARGON PLASMA COAGULATION/BICAP);  Surgeon: Umberto Ganong, Bearl Limes, MD;  [facility];  Service: Gastroenterology;;   HOT HEMOSTASIS  09/28/2023   Procedure: HOT HEMOSTASIS (ARGON PLASMA COAGULATION/BICAP);  Surgeon: Umberto Ganong, Bearl Limes, MD;  [facility];  Service: Gastroenterology;;   Londa Rival DILATION N/A 04/15/2020   Procedure: Dorothyann Gather;  Surgeon: Suzette Espy, MD;  [facility];  Service: Endoscopy;  Laterality: N/A;   POLYPECTOMY  04/15/2020   Procedure: POLYPECTOMY;  Surgeon: Suzette Espy, MD;  [facility];  Service: Endoscopy;;  colon   ROTATOR CUFF REPAIR Bilateral    TYMPANOPLASTY       reports that he has been smoking cigarettes. He has never used smokeless tobacco. He reports that he does not currently use alcohol. He reports that he does not use drugs.  Allergies  Allergen Reactions   Lisinopril Swelling    ACEi angioedema requiring intubation 07/2022   Caduet [Amlodipine-Atorvastatin] Swelling    Family History  Problem Relation Age of Onset   Diabetes Mother    Heart attack Mother    Heart attack Father    Heart attack Brother    Colon cancer Neg Hx    Stomach cancer Neg Hx     Prior to Admission medications   Medication Sig Start Date End Date Taking? Authorizing Provider  acetaminophen (TYLENOL) 500 MG tablet Take 1,000 mg by mouth every 6 (six) hours as needed for mild pain.    [provider]  BREZTRI AEROSPHERE 160-9-4.8 MCG/ACT AERO Inhale 2 puffs into the lungs 2 (two) times daily. 08/26/23   [provider]  cyanocobalamin (,VITAMIN B-12,) 1000 MCG/ML injection Inject 1,000 mcg into the muscle every 30 (thirty) days. 02/11/22   [provider]  diphenhydramine-acetaminophen (TYLENOL PM) 25-500 MG TABS tablet Take 1 tablet by  mouth at bedtime as needed (sleep).    [provider]  empagliflozin (JARDIANCE) 10 MG TABS tablet Take 1 tablet (10 mg total) by mouth daily. 08/01/22   Sheikh, Omair Latif, DO  insulin NPH-regular Human (NOVOLIN 70/30) (70-30) 100 UNIT/ML injection Inject 40 Units into the skin in the morning and at bedtime.    [provider]  levocetirizine (XYZAL) 5 MG tablet Take 5 mg by mouth at bedtime. 03/11/22   [provider]  metoprolol succinate (TOPROL-XL) 25 MG 24 hr tablet Take 1 tablet (25 mg total) by mouth 2 (two) times daily. 12/16/23   Laurann Pollock, MD  nitroGLYCERIN (NITROSTAT) 0.4 MG SL tablet DISSOLVE 1 TABLET SUBLINGUALLY AS NEEDED FOR CHEST PAIN, MAY REPEAT EVERY 5 MINUTES. AFTER 3 CALL 911. 11/30/23   Laurann Pollock, MD  octreotide (SANDOSTATIN) 100 MCG/ML SOLN injection Inject 1 mL (100 mcg total) into the skin every 12 (twelve) hours. 11/19/23 02/17/24  Evander Hills, PA-C  pantoprazole (PROTONIX) 40 MG tablet Take 1 tablet (40 mg total) by  mouth 2 (two) times daily. 09/30/23 09/29/24  Justina Oman, MD  pregabalin (LYRICA) 25 MG capsule Take 25 mg by mouth 2 (two) times daily. 10/27/23   [provider]  rosuvastatin (CRESTOR) 40 MG tablet Take 1 tablet (40 mg total) by mouth daily. 05/07/23 05/01/24  Laurann Pollock, MD  spironolactone (ALDACTONE) 25 MG tablet Take 0.5 tablets (12.5 mg total) by mouth daily. 10/01/23   Justina Oman, MD  Syringe/Needle, Disp, (SYRINGE 3CC/25GX5/8") 25G X 5/8" 3 ML MISC Inject 1 mL in the morning and at bedtime. 11/19/23   Evander Hills, PA-C  traMADol (ULTRAM) 50 MG tablet Take 50-100 mg by mouth in the morning and at bedtime. 09/20/19   [provider]  zolpidem (AMBIEN) 5 MG tablet Take 5 mg by mouth at bedtime as needed for sleep. 09/26/20   [provider]    Physical Exam: Vitals:   12/25/23 0934 12/25/23 1234  BP: (!) 126/45 (!) 126/58  Pulse: 98 83  Resp: 16 18  Temp: 97.9 F (36.6  C) 98.5 F (36.9 C)  TempSrc: Oral Oral  SpO2: 100% 96%  Weight:  127.1 kg  Height:  6' (1.829 m)   Constitutional: awake, alert, NAD, calm, comfortable, appears pale  Eyes: PERRL, lids and conjunctivae normal ENMT: Mucous membranes are pale but moist. Posterior pharynx clear of any exudate or lesions.  Neck: normal, supple, no masses, no thyromegaly Respiratory: clear to auscultation bilaterally, no wheezing, no crackles. Normal respiratory effort. No accessory muscle use.  Cardiovascular: normal s1, s2 sounds, no murmurs / rubs / gallops. No extremity edema. 2+ pedal pulses. No carotid bruits.  Abdomen: no tenderness, no masses palpated. No hepatosplenomegaly. Bowel sounds positive.  Musculoskeletal: no clubbing / cyanosis. No joint deformity upper and lower extremities. Good ROM, no contractures. Normal muscle tone.  Skin: no rashes, lesions, ulcers. No induration Neurologic: CN 2-12 grossly intact. Sensation intact, DTR normal. Strength 5/5 in all 4.  Psychiatric: Normal judgment and insight. Alert and oriented x 3. Normal mood.   Labs on Admission: I have personally reviewed following labs and imaging studies  CBC: Recent Labs  Lab 12/25/23 0937  WBC 11.6*  HGB 6.2*  HCT 25.0*  MCV 81.4  PLT 184   Basic Metabolic Panel: Recent Labs  Lab 12/25/23 0937  NA 138  K 4.2  CL 102  CO2 27  GLUCOSE 253*  BUN 17  CREATININE 1.26*  CALCIUM 9.1   GFR: Estimated Creatinine Clearance: 75.2 mL/min (A) (by C-G formula based on SCr of 1.26 mg/dL (H)). Liver Function Tests: Recent Labs  Lab 12/25/23 0937  AST 14*  ALT 9  ALKPHOS 72  BILITOT 0.3  PROT 7.1  ALBUMIN 3.3*   No results for input(s): "LIPASE", "AMYLASE" in the last 168 hours. No results for input(s): "AMMONIA" in the last 168 hours. Coagulation Profile: No results for input(s): "INR", "PROTIME" in the last 168 hours. Cardiac Enzymes: No results for input(s): "CKTOTAL", "CKMB", "CKMBINDEX", "TROPONINI" in  the last 168 hours. BNP (last 3 results) No results for input(s): "PROBNP" in the last 8760 hours. HbA1C: No results for input(s): "HGBA1C" in the last 72 hours. CBG: Recent Labs  Lab 12/25/23 1238  GLUCAP 201*   Lipid Profile: No results for input(s): "CHOL", "HDL", "LDLCALC", "TRIG", "CHOLHDL", "LDLDIRECT" in the last 72 hours. Thyroid Function Tests: No results for input(s): "TSH", "T4TOTAL", "FREET4", "T3FREE", "THYROIDAB" in the last 72 hours. Anemia Panel: Recent Labs    12/25/23 1111  VITAMINB12  288  FOLATE 6.7  FERRITIN 6*  TIBC 525*  IRON 16*  RETICCTPCT 4.0*   Urine analysis:    Component Value Date/Time   COLORURINE YELLOW 08/28/2021 1025   APPEARANCEUR CLEAR 08/28/2021 1025   LABSPEC 1.010 08/28/2021 1025   PHURINE 5.5 08/28/2021 1025   GLUCOSEU NEGATIVE 08/28/2021 1025   HGBUR LARGE (A) 08/28/2021 1025   BILIRUBINUR NEGATIVE 08/28/2021 1025   KETONESUR NEGATIVE 08/28/2021 1025   PROTEINUR NEGATIVE 08/28/2021 1025   NITRITE NEGATIVE 08/28/2021 1025   LEUKOCYTESUR NEGATIVE 08/28/2021 1025    Radiological Exams on Admission: No results found.  EKG: Independently reviewed.   Assessment/Plan Principal Problem:   Acute on chronic blood loss anemia Active Problems:   Essential hypertension   Mixed hyperlipidemia   Obesity, Class II, BMI 35-39.9   Tobacco abuse   Personal history of noncompliance with medical treatment, presenting hazards to health   Iron deficiency anemia   Esophageal dysphagia   Chronic combined systolic and diastolic CHF (congestive heart failure) (HCC)   Exertional angina (HCC)   CRI (chronic renal insufficiency), stage 2 (mild)   Guaiac positive stools   Type 2 diabetes mellitus with hyperglycemia (HCC)   COPD (chronic obstructive pulmonary disease) (HCC)   Chronic respiratory failure with hypoxia (HCC)   Coronary artery disease   GERD (gastroesophageal reflux disease)   Chronic pain   Acute on chronic blood loss anemia   - pt has multiple AVMs - he could not afford SQ octreotide injections - Hg down to 6.2 - 2 units PRBC ordered - octreotide SQ x 1 dose given in ED - IV venofer given in ED on 4/12 - follow CBC  Severe Iron Deficiency - IV iron given in ED and 2 units of PRBC ordered - this should significantly boost iron levels  Guaiac positive stools - this is chronic and GI fully worked him up - likely from bleeding AVMs - He reports he is being enrolled in a patient assistance program for octreotide  Chronic hypoxic respiratory failure - continue home supplemental oxygen requirement  Chronic combined systolic/diastolic heart failure - appears compensated at this time - follow closely with transfusion of 2 units PRBC - resume home cardiac meds when home meds reconciled   Type 2 DM  - SSI coverage ordered - plan to order home basal insulin when home meds reconciled - follow CBG closely   CAD - stable for now - transfusing to get Hg up to around 8 - resume home cardiac medication   DVT prophylaxis: SCDs  Code Status: Full   Family Communication:   Disposition Plan:   Consults called: GI   Admission status: OBV Time spent: 58 mins  Level of care: Telemetry Faustino Hook MD [facility] How to contact the [facility] Attending or Consulting provider 7A - 7P or covering provider during after hours 7P -7A, for this patient?  Check the care team in [facility] and look for a) attending/consulting [facility] provider listed and b) the [facility] team listed Log into [email or web address] and use [place]'s universal password to access. If you do not have the password, please contact the hospital operator. Locate the [facility] provider you are looking for under [facility] and page to a number that you can be directly reached. If you still have difficulty reaching the provider, please page the [facility] (Director on Call) for the Hospitalists listed on amion for assistance.   If 7PM-7AM, please contact  night-coverage [email or web address] Password [facility]  12/25/2023, 12:49 PM

## 2023-12-25 NOTE — ED Notes (Signed)
 Pt requesting to stand up at the bedside due to pain in his lower back. Advised pt that it would be best he stayed in the bed due to unsteady gait and multiple falls at home per patient. Pt insists on standing up at the bedside.

## 2023-12-25 NOTE — Progress Notes (Signed)
 UNMATCHED BLOOD PRODUCT NOTE  Compare the patient ID on the blood tag to the patient ID on the hospital armband and Blood Bank armband. Then confirm the unit number on the blood tag matches the unit number on the blood product.  If a discrepancy is discovered return the product to blood bank immediately.   Blood Product Type: Packed Red Blood Cells  Unit #: (Found on blood product bag, begins with W) [id number]  Product Code #: (Found on blood product bag, begins with E) [id number]   Start Time: 1635  Starting Rate: 120 ml/hr  Rate increase/decreased  (if applicable):      ml/hr  Rate changed time (if applicable):    Stop Time: TBD   All Other Documentation should be documented within the Blood Admin Flowsheet per policy.  Verified by Madelene Schanz, LPN & Justus Opitz, RN Vitals within normal range prior to beginning transfusion.

## 2023-12-25 NOTE — Progress Notes (Signed)
 This care took over patient care at 1914 pRBC that was infusing at the time of shift change is now completed at Gregory Alvarado nurse attempted to stop the infusion in the flowsheet but unable able to as there was no documentation of start time. There is a progress note indicating reason.   Infusion stop time 1928  See post transfusion VS at 1930  No reaction symptoms noted

## 2023-12-25 NOTE — ED Triage Notes (Signed)
 Pt reports low Hgb of 6.1. Denies pain, lightheadedness, or new SHOB. Pt on chronic O2 at 2L.

## 2023-12-25 NOTE — ED Provider Notes (Signed)
  EMERGENCY DEPARTMENT AT [facility] Provider Note   CSN: [id number] Arrival date & time: 12/25/23  8295     History  Chief Complaint  Patient presents with   Abnormal Lab    Gregory Alvarado is a 73 y.o. male. He has history of chronic systolic and diastolic heart failure, heart disease status post drug-eluting stent and December 2021, type 2 diabetes, high cholesterol, GI bleed, COPD, chronic respiratory failure on 2 L oxygen at baseline.  Presents ER today after his primary physician called him and told his his hemoglobin was low and he needed another blood transfusion.  He states he has been putting up black stools as he has in the past, denies any bright red blood per rectum, no abdominal pain nausea or vomiting.  He reports he has had this in the past and has had to have multiple transfusions before.  He denies fatigue, weakness, increased shortness of breath, fast heart rate or other worrisome changes.  He is not on blood thinners.   Records show patient had been prescribed octreotide from the GI office, he was not able to get it because it is $200 a month.  He confirms that he has not picked this up  Abnormal Lab      Home Medications Prior to Admission medications   Medication Sig Start Date End Date Taking? Authorizing Provider  acetaminophen (TYLENOL) 500 MG tablet Take 1,000 mg by mouth every 6 (six) hours as needed for mild pain.    [provider]  BREZTRI AEROSPHERE 160-9-4.8 MCG/ACT AERO Inhale 2 puffs into the lungs 2 (two) times daily. 08/26/23   [provider]  cyanocobalamin (,VITAMIN B-12,) 1000 MCG/ML injection Inject 1,000 mcg into the muscle every 30 (thirty) days. 02/11/22   [provider]  diphenhydramine-acetaminophen (TYLENOL PM) 25-500 MG TABS tablet Take 1 tablet by mouth at bedtime as needed (sleep).    [provider]  empagliflozin (JARDIANCE) 10 MG TABS tablet Take 1 tablet (10 mg total) by mouth  daily. 08/01/22   Sheikh, Omair Latif, DO  insulin NPH-regular Human (NOVOLIN 70/30) (70-30) 100 UNIT/ML injection Inject 40 Units into the skin in the morning and at bedtime.    [provider]  levocetirizine (XYZAL) 5 MG tablet Take 5 mg by mouth at bedtime. 03/11/22   [provider]  metoprolol succinate (TOPROL-XL) 25 MG 24 hr tablet Take 1 tablet (25 mg total) by mouth 2 (two) times daily. 12/16/23   Laurann Pollock, MD  nitroGLYCERIN (NITROSTAT) 0.4 MG SL tablet DISSOLVE 1 TABLET SUBLINGUALLY AS NEEDED FOR CHEST PAIN, MAY REPEAT EVERY 5 MINUTES. AFTER 3 CALL 911. 11/30/23   Laurann Pollock, MD  octreotide (SANDOSTATIN) 100 MCG/ML SOLN injection Inject 1 mL (100 mcg total) into the skin every 12 (twelve) hours. 11/19/23 02/17/24  Evander Hills, PA-C  pantoprazole (PROTONIX) 40 MG tablet Take 1 tablet (40 mg total) by mouth 2 (two) times daily. 09/30/23 09/29/24  Justina Oman, MD  pregabalin (LYRICA) 25 MG capsule Take 25 mg by mouth 2 (two) times daily. 10/27/23   [provider]  rosuvastatin (CRESTOR) 40 MG tablet Take 1 tablet (40 mg total) by mouth daily. 05/07/23 05/01/24  Laurann Pollock, MD  spironolactone (ALDACTONE) 25 MG tablet Take 0.5 tablets (12.5 mg total) by mouth daily. 10/01/23   Justina Oman, MD  Syringe/Needle, Disp, (SYRINGE 3CC/25GX5/8") 25G X 5/8" 3 ML MISC Inject 1 mL in the morning and at bedtime. 11/19/23  Evander Hills, PA-C  traMADol (ULTRAM) 50 MG tablet Take 50-100 mg by mouth in the morning and at bedtime. 09/20/19   [provider]  zolpidem (AMBIEN) 5 MG tablet Take 5 mg by mouth at bedtime as needed for sleep. 09/26/20   [provider]      Allergies    Lisinopril and Caduet [amlodipine-atorvastatin]    Review of Systems   Review of Systems  Physical Exam Updated Vital Signs BP (!) 126/45   Pulse 98   Temp 97.9 F (36.6 C) (Oral)   Resp 16   SpO2 100%  Physical Exam Vitals and nursing note reviewed.   Constitutional:      General: He is not in acute distress.    Appearance: He is well-developed. He is obese.  HENT:     Head: Normocephalic and atraumatic.     Mouth/Throat:     Mouth: Mucous membranes are moist.  Eyes:     Extraocular Movements: Extraocular movements intact.     Conjunctiva/sclera: Conjunctivae normal.     Pupils: Pupils are equal, round, and reactive to light.  Cardiovascular:     Rate and Rhythm: Normal rate and regular rhythm.     Heart sounds: No murmur heard. Pulmonary:     Effort: Pulmonary effort is normal. No respiratory distress.     Breath sounds: Normal breath sounds.  Abdominal:     Palpations: Abdomen is soft.     Tenderness: There is no abdominal tenderness.  Genitourinary:    Rectum: Guaiac result positive.     Comments: Dark brown stool Musculoskeletal:        General: No swelling.     Cervical back: Neck supple.  Skin:    General: Skin is warm and dry.     Capillary Refill: Capillary refill takes less than 2 seconds.  Neurological:     General: No focal deficit present.     Mental Status: He is alert and oriented to person, place, and time.  Psychiatric:        Mood and Affect: Mood normal.        Behavior: Behavior normal.     ED Results / Procedures / Treatments   Labs (all labs ordered are listed, but only abnormal results are displayed) Labs Reviewed  CBC - Abnormal; Notable for the following components:      Result Value   WBC 11.6 (*)    RBC 3.07 (*)    Hemoglobin 6.2 (*)    HCT 25.0 (*)    MCH 20.2 (*)    MCHC 24.8 (*)    RDW 19.4 (*)    All other components within normal limits  COMPREHENSIVE METABOLIC PANEL WITH GFR - Abnormal; Notable for the following components:   Glucose, Bld 253 (*)    Creatinine, Ser 1.26 (*)    Albumin 3.3 (*)    AST 14 (*)    All other components within normal limits  OCCULT BLOOD X 1 CARD TO LAB, STOOL  VITAMIN B12  FOLATE  IRON AND TIBC  FERRITIN  RETICULOCYTES  TYPE AND SCREEN   PREPARE RBC (CROSSMATCH)    EKG None  Radiology No results found.  Procedures .Critical Care  Performed by: Aimee Houseman, PA-C Authorized by: Aimee Houseman, PA-C   Critical care provider statement:    Critical care time (minutes):  30   Critical care time was exclusive of:  Separately billable procedures and treating other patients and teaching time  Critical care was necessary to treat or prevent imminent or life-threatening deterioration of the following conditions: severe anemia requiring transfusion.   Critical care was time spent personally by me on the following activities:  Development of treatment plan with patient or surrogate, discussions with consultants, evaluation of patient's response to treatment, examination of patient, ordering and review of laboratory studies, ordering and review of radiographic studies, ordering and performing treatments and interventions, pulse oximetry, re-evaluation of patient's condition, review of old charts and obtaining history from patient or surrogate   Care discussed with comment:  Dr. Alita Irwin, Dr. Lincoln Renshaw     Medications Ordered in ED Medications  0.9 %  sodium chloride infusion (Manually program via Guardrails IV Fluids) (has no administration in time range)  octreotide (SANDOSTATIN) injection 100 mcg (has no administration in time range)  iron sucrose (VENOFER) 100 mg in sodium chloride 0.9 % 100 mL IVPB (has no administration in time range)  pantoprazole (PROTONIX) injection 80 mg (80 mg Intravenous Given 12/25/23 1101)    ED Course/ Medical Decision Making/ A&P                                 Medical Decision Making This patient presents to the ED for concern of low hemoglobin, this involves an extensive number of treatment options, and is a complaint that carries with it a high risk of complications and morbidity.  The differential diagnosis includes anemia, GI bleeding, iron deficiency, folate deficiency, anemia of  chronic disease, other   Co morbidities that complicate the patient evaluation :   CHF, diabetes, chronic GI bleeding    Additional history obtained:  Additional history obtained from EMR External records from outside source obtained and reviewed including notes, previous labs, endoscopy results   Lab Tests:  I Ordered, and personally interpreted labs.  The pertinent results include: Hemoglobin is 6.2, hematocrit 25, white blood cell count 11.6, platelets normal; CMP shows hyperglycemia with glucose 253, creatinine slightly increased from baseline at 1.26    Cardiac monitoring-sinus rhythm, satting 100% on baseline O2   Consultations Obtained:  I requested consultation with the gastroenterologist Dr. Alita Irwin,  and discussed lab and imaging findings as well as pertinent plan - they recommend: Transfuse blood, no need for procedure now, also requested iron panel and IV iron while patient is here.  I also consulted with the [facility] hospitalist, Dr. Lincoln Renshaw who is agreeable with admission, discussed patient's findings and the GI recommendations.   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  He had admission in February 2025 for GI bleeding at that time as well.  Denies abdominal pain, no vomiting, no significant symptoms, blood pressure is 126/45, heart rate 98 bpm, plan on transfusion of 2 units since he does have underlying cardiac disease.  Consulted with Dr. Alita Irwin from GI-he does not feel patient needs any procedures, he had recent ablations of AVMs.  Since he is not having active hemorrhage patient can be discharged after transfusion, he did recommend 1 dose of subcu octreotide.  Also recommended iron profile and IV iron while he is here.  Patient does have antibodies also,  will need admission to monitor him while getting the transfusion due to delay in obtaining  the appropriate blood products. I ordered medication including packed red blood cells for the low hemoglobin Patient reevaluated in the ER multiple times, blood pressure and heart rate unchanged.  No need for emergent release blood at this time. I have reviewed the patients home medicines and have made adjustments as needed       Amount and/or Complexity of Data Reviewed Labs: ordered.  Risk Prescription drug management. Decision regarding hospitalization.           Final Clinical Impression(s) / ED Diagnoses Final diagnoses:  Gastrointestinal hemorrhage, unspecified gastrointestinal hemorrhage type  Symptomatic anemia    Rx / DC Orders ED Discharge Orders     None         Joshua Nieves 12/25/23 1130    Mordecai Applebaum, MD 12/25/23 1300

## 2023-12-25 NOTE — Hospital Course (Signed)
 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  He was supposed to be taking octreotide subcu injections however due to the exorbitant cost of the medication he has not been able to afford it on an outpatient basis.  He reported feeling weak prompting his PCP to check a CBC. He says he was told his Hg was down to 6.1 and he needed to go to ED.  His low Hg was confirmed in ED with low iron stores noted.  He was given IV venofer infusion in ED.  He was given 1 dose of subcutaneous octreotide.  He was typed and screened for 2 units PRBC and admission was requested due to prolonged time required to crossmatch his blood with autoantibodies and subsequent high risk transfusion will need to be monitored.  He again tested hemoccult positive. GI was consulted by ED provider.

## 2023-12-25 NOTE — Progress Notes (Signed)
 This care took over patient care at 1914 pRBC that was infusing at the time of shift change is now completed at [facility] nurse attempted to stop the infusion in the flowsheet but unable able to as there was no documentation of start time. There is a progress note indicating reason.    Infusion stop time 1928   See post transfusion VS at 1930  Total volume infused 334 ml   No reaction symptoms noted

## 2023-12-26 DIAGNOSIS — D509 Iron deficiency anemia, unspecified: Secondary | ICD-10-CM | POA: Diagnosis not present

## 2023-12-26 DIAGNOSIS — Z720 Tobacco use: Secondary | ICD-10-CM | POA: Diagnosis not present

## 2023-12-26 DIAGNOSIS — R1319 Other dysphagia: Secondary | ICD-10-CM

## 2023-12-26 DIAGNOSIS — K219 Gastro-esophageal reflux disease without esophagitis: Secondary | ICD-10-CM | POA: Diagnosis not present

## 2023-12-26 DIAGNOSIS — J449 Chronic obstructive pulmonary disease, unspecified: Secondary | ICD-10-CM | POA: Diagnosis not present

## 2023-12-26 DIAGNOSIS — I10 Essential (primary) hypertension: Secondary | ICD-10-CM | POA: Diagnosis not present

## 2023-12-26 DIAGNOSIS — R195 Other fecal abnormalities: Secondary | ICD-10-CM | POA: Diagnosis not present

## 2023-12-26 DIAGNOSIS — I5042 Chronic combined systolic (congestive) and diastolic (congestive) heart failure: Secondary | ICD-10-CM | POA: Diagnosis not present

## 2023-12-26 DIAGNOSIS — N182 Chronic kidney disease, stage 2 (mild): Secondary | ICD-10-CM | POA: Diagnosis not present

## 2023-12-26 DIAGNOSIS — J9611 Chronic respiratory failure with hypoxia: Secondary | ICD-10-CM

## 2023-12-26 DIAGNOSIS — D62 Acute posthemorrhagic anemia: Secondary | ICD-10-CM | POA: Diagnosis not present

## 2023-12-26 LAB — BASIC METABOLIC PANEL WITH GFR
Anion gap: 9 (ref 5–15)
BUN: 15 mg/dL (ref 8–23)
CO2: 26 mmol/L (ref 22–32)
Calcium: 8.9 mg/dL (ref 8.9–10.3)
Chloride: 102 mmol/L (ref 98–111)
Creatinine, Ser: 1.29 mg/dL — ABNORMAL HIGH (ref 0.61–1.24)
GFR, Estimated: 60 mL/min — ABNORMAL LOW (ref >60)
Glucose, Bld: 161 mg/dL — ABNORMAL HIGH (ref 70–99)
Potassium: 4.3 mmol/L (ref 3.5–5.1)
Sodium: 137 mmol/L (ref 135–145)

## 2023-12-26 LAB — CBC
HCT: 27.5 % — ABNORMAL LOW (ref 39.0–52.0)
Hemoglobin: 7.3 g/dL — ABNORMAL LOW (ref 13.0–17.0)
MCH: 21.8 pg — ABNORMAL LOW (ref 26.0–34.0)
MCHC: 26.5 g/dL — ABNORMAL LOW (ref 30.0–36.0)
MCV: 82.1 fL (ref 80.0–100.0)
Platelets: 182 10*3/uL (ref 150–400)
RBC: 3.35 MIL/uL — ABNORMAL LOW (ref 4.22–5.81)
RDW: 19.4 % — ABNORMAL HIGH (ref 11.5–15.5)
WBC: 13.6 10*3/uL — ABNORMAL HIGH (ref 4.0–10.5)
nRBC: 0.1 % (ref 0.0–0.2)

## 2023-12-26 LAB — GLUCOSE, CAPILLARY
Glucose-Capillary: 157 mg/dL — ABNORMAL HIGH (ref 70–99)
Glucose-Capillary: 154 mg/dL — ABNORMAL HIGH (ref 70–99)
Glucose-Capillary: 229 mg/dL — ABNORMAL HIGH (ref 70–99)

## 2023-12-26 LAB — MAGNESIUM: Magnesium: 1.9 mg/dL (ref 1.7–2.4)

## 2023-12-26 MED ORDER — INSULIN NPH ISOPHANE & REGULAR (70-30) 100 UNIT/ML ~~LOC~~ SUSP: 15.0000 [IU] | Freq: Two times a day (BID) | SUBCUTANEOUS | Status: AC

## 2023-12-26 MED ORDER — VITAMIN C 500 MG PO CAPS: 500.0000 mg | ORAL_CAPSULE | Freq: Every day | ORAL | 1 refills | Status: AC

## 2023-12-26 MED ORDER — OCTREOTIDE ACETATE 100 MCG/ML IJ SOLN
100.0000 ug | Freq: Once | INTRAMUSCULAR | Status: AC
  Administered 2023-12-26: 100 ug via SUBCUTANEOUS
  Filled 2023-12-26: qty 1

## 2023-12-26 MED ORDER — INSULIN ASPART PROT & ASPART (70-30 MIX) 100 UNIT/ML ~~LOC~~ SUSP
8.0000 [IU] | Freq: Two times a day (BID) | SUBCUTANEOUS | Status: DC
  Administered 2023-12-26: 8 [IU] via SUBCUTANEOUS
  Filled 2023-12-26: qty 10

## 2023-12-26 MED ORDER — IRON (FERROUS SULFATE) 325 (65 FE) MG PO TABS: 1.0000 | ORAL_TABLET | Freq: Every day | ORAL | 1 refills | Status: AC

## 2023-12-26 NOTE — Discharge Summary (Signed)
 Physician Discharge Summary  Gregory Alvarado ZOX:[id number] DOB: 08/07/1951 DOA: 12/25/2023  PCP: Omie Bickers, MD GI: [facility]  Admit date: 12/25/2023 Discharge date: 12/26/2023  Admitted From:  Home  Disposition: Home   Recommendations for Outpatient Follow-up:  Follow up with PCP in 3 days for repeat CBC Follow up with [facility] in 2 weeks Please check CBC in 3-5 days Please follow up on the patient assistance application for octreotide  Discharge Condition: STABLE  CODE STATUS: FULL DIET: low sodium heart healthy, iron rich foods   Brief Hospitalization Summary: Please see all hospital notes, images, labs for full details of the hospitalization. 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  He was supposed to be taking octreotide subcu injections however due to the exorbitant cost of the medication he has not been able to afford it on an outpatient basis.  He reported feeling weak prompting his PCP to check a CBC. He says he was told his Hg was down to 6.1 and he needed to go to ED.  His low Hg was confirmed in ED with low iron stores noted.  He was given IV venofer infusion in ED.  He was given 1 dose of subcutaneous octreotide.  He was typed and screened for 2 units PRBC and admission was requested due to prolonged time required to crossmatch his blood with autoantibodies and subsequent high risk transfusion will need to be monitored.  He again tested hemoccult  positive. GI was consulted by ED provider.   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  He should be taking octreotide twice daily however the cost of the medication has been prohibitive and [facility] is working with him for patient assistance to receive octreotide injections for home use.  He was transfused 2 units PRBCs and tolerated well.  Hemoglobin up to 7.2.  He was advised to stay another night to have his CBC checked again in the morning however patient declines to stay and reports that he would rather go to see his PCP in the next 2 to 3 days to have his hemoglobin rechecked on an outpatient basis.  He said he will return to ED if he develops any acute symptoms.  Patient was treated with a dose of IV Venofer in the ED for iron supplementation.  He was given 2 units of PRBCs.  He will discharge on oral iron supplementation with vitamin C supplementation to assist with better iron absorption.  He made informed consent decision to discharge home today.  I strongly advised him to see his PCP in a couple of days to have his CBC rechecked.  He is going to follow-up with [facility] as well.  Patient being discharged home in stable condition.  He is high risk for readmission given ongoing occult GI bleeding and inability to afford the octreotide medication.  Hopefully patient assistance will approve for him to receive the medication at  home.  In my opinion patient does have decisional capacity to make informed medical decisions regarding his health care at this time.  Discharge Diagnoses:  Principal Problem:   Acute on chronic blood loss anemia Active Problems:   Essential hypertension   Mixed hyperlipidemia   Obesity, Class II, BMI 35-39.9   Tobacco abuse   Personal history of noncompliance with medical treatment, presenting hazards to health   Iron deficiency  anemia   Esophageal dysphagia   Chronic combined systolic and diastolic CHF (congestive heart failure) (HCC)   Exertional angina (HCC)   CRI (chronic renal insufficiency), stage 2 (mild)   Guaiac positive stools   Type 2 diabetes mellitus with hyperglycemia (HCC)   COPD (chronic obstructive pulmonary disease) (HCC)   Chronic respiratory failure with hypoxia (HCC)   Coronary artery disease   GERD (gastroesophageal reflux disease)   Chronic pain  Discharge Instructions:  Allergies as of 12/26/2023       Reactions   Lisinopril Swelling   ACEi angioedema requiring intubation 07/2022   Caduet [amlodipine-atorvastatin] Swelling        Medication List     TAKE these medications    acetaminophen 500 MG tablet Commonly known as: TYLENOL Take 1,000 mg by mouth every 6 (six) hours as needed for mild pain.   Breztri Aerosphere 160-9-4.8 MCG/ACT Aero inhaler Generic drug: budeson-glycopyrrolate-formoterol Inhale 2 puffs into the lungs 2 (two) times daily.   cyanocobalamin 1000 MCG/ML injection Commonly known as: VITAMIN B12 Inject 1,000 mcg into the muscle every 30 (thirty) days.   diphenhydramine-acetaminophen 25-500 MG Tabs tablet Commonly known as: TYLENOL PM Take 2 tablets by mouth at bedtime.   empagliflozin 10 MG Tabs tablet Commonly known as: JARDIANCE Take 1 tablet (10 mg total) by mouth daily.   insulin NPH-regular Human (70-30) 100 UNIT/ML injection Inject 15 Units into the skin in the morning and at bedtime. What changed: how much to take   Iron (Ferrous Sulfate) 325 (65 Fe) MG Tabs Take 1 tablet by mouth daily in the afternoon. Start taking on: December 27, 2023   levocetirizine 5 MG tablet Commonly known as: XYZAL Take 5 mg by mouth at bedtime.   metoprolol succinate 25 MG 24 hr tablet Commonly known as: TOPROL-XL Take 1 tablet (25 mg total) by mouth 2 (two) times daily.   nitroGLYCERIN 0.4 MG SL tablet Commonly known as: NITROSTAT DISSOLVE 1 TABLET  SUBLINGUALLY AS NEEDED FOR CHEST PAIN, MAY REPEAT EVERY 5 MINUTES. AFTER 3 CALL 911. What changed: See the new instructions.   octreotide 100 MCG/ML Soln injection Commonly known as: SANDOSTATIN Inject 1 mL (100 mcg total) into the skin every 12 (twelve) hours.   pantoprazole 40 MG tablet Commonly known as: Protonix Take 1 tablet (40 mg total) by mouth 2 (two) times daily.   pregabalin 25 MG capsule Commonly known as: LYRICA Take 25 mg by mouth 2 (two) times daily.   rosuvastatin 40 MG tablet Commonly known as: CRESTOR Take 1 tablet (40 mg total) by mouth daily.   spironolactone 25 MG tablet Commonly known as: ALDACTONE Take 0.5 tablets (12.5 mg total) by mouth daily.   SYRINGE 3CC/25GX5/8" 25G X 5/8" 3 ML Misc Inject 1 mL in the morning and at bedtime.   traMADol 50 MG tablet Commonly known as: ULTRAM Take 50-100 mg by mouth in the morning and at bedtime.   Vitamin C 500 MG Caps Take 500 mg by mouth daily. Take with iron tablet for better iron absorption.  zolpidem 5 MG tablet Commonly known as: AMBIEN Take 5 mg by mouth at bedtime.        Follow-up Information     Omie Bickers, MD. Schedule an appointment as soon as possible for a visit in 3 day(s).   Specialty: Internal Medicine Why: Hospital Follow Up and recheck labs Contact information: [address] [place] [facility] [address] [phone]         [facility]. Schedule an appointment as soon as possible for a visit in 2 week(s).   Why: Hospital Follow Up Contact information: [address] [place]  [address] [phone]               Allergies  Allergen Reactions   Lisinopril Swelling    ACEi angioedema requiring intubation 07/2022   Caduet [Amlodipine-Atorvastatin] Swelling   Allergies as of 12/26/2023       Reactions   Lisinopril Swelling   ACEi angioedema requiring intubation 07/2022   Caduet [amlodipine-atorvastatin] Swelling         Medication List     TAKE these medications    acetaminophen 500 MG tablet Commonly known as: TYLENOL Take 1,000 mg by mouth every 6 (six) hours as needed for mild pain.   Breztri Aerosphere 160-9-4.8 MCG/ACT Aero inhaler Generic drug: budeson-glycopyrrolate-formoterol Inhale 2 puffs into the lungs 2 (two) times daily.   cyanocobalamin 1000 MCG/ML injection Commonly known as: VITAMIN B12 Inject 1,000 mcg into the muscle every 30 (thirty) days.   diphenhydramine-acetaminophen 25-500 MG Tabs tablet Commonly known as: TYLENOL PM Take 2 tablets by mouth at bedtime.   empagliflozin 10 MG Tabs tablet Commonly known as: JARDIANCE Take 1 tablet (10 mg total) by mouth daily.   insulin NPH-regular Human (70-30) 100 UNIT/ML injection Inject 15 Units into the skin in the morning and at bedtime. What changed: how much to take   Iron (Ferrous Sulfate) 325 (65 Fe) MG Tabs Take 1 tablet by mouth daily in the afternoon. Start taking on: December 27, 2023   levocetirizine 5 MG tablet Commonly known as: XYZAL Take 5 mg by mouth at bedtime.   metoprolol succinate 25 MG 24 hr tablet Commonly known as: TOPROL-XL Take 1 tablet (25 mg total) by mouth 2 (two) times daily.   nitroGLYCERIN 0.4 MG SL tablet Commonly known as: NITROSTAT DISSOLVE 1 TABLET SUBLINGUALLY AS NEEDED FOR CHEST PAIN, MAY REPEAT EVERY 5 MINUTES. AFTER 3 CALL 911. What changed: See the new instructions.   octreotide 100 MCG/ML Soln injection Commonly known as: SANDOSTATIN Inject 1 mL (100 mcg total) into the skin every 12 (twelve) hours.   pantoprazole 40 MG tablet Commonly known as: Protonix Take 1 tablet (40 mg total) by mouth 2 (two) times daily.   pregabalin 25 MG capsule Commonly known as: LYRICA Take 25 mg by mouth 2 (two) times daily.   rosuvastatin 40 MG tablet Commonly known as: CRESTOR Take 1 tablet (40 mg total) by mouth daily.   spironolactone 25 MG tablet Commonly known as: ALDACTONE Take 0.5  tablets (12.5 mg total) by mouth daily.   SYRINGE 3CC/25GX5/8" 25G X 5/8" 3 ML Misc Inject 1 mL in the morning and at bedtime.   traMADol 50 MG tablet Commonly known as: ULTRAM Take 50-100 mg by mouth in the morning and at bedtime.   Vitamin C 500 MG Caps Take 500 mg by mouth daily. Take with iron tablet for better iron absorption.   zolpidem 5 MG tablet Commonly known as: AMBIEN Take  5 mg by mouth at bedtime.        Procedures/Studies: ECHOCARDIOGRAM LIMITED Result Date: 11/26/2023    ECHOCARDIOGRAM LIMITED REPORT   [name] Date of Exam: 11/26/2023 Medical Rec #:  [id number]     Height:       72.0 in Accession #:    [id number]    Weight:       260.9 lb Date of Birth:  01/22/1951     BSA:          2.385 m Patient Age:    72 years      BP:           128/80 mmHg Patient Gender: M             HR:           86 bpm. Exam Location:  [facility] Procedure: Limited Echo, Limited Color Doppler and Cardiac Doppler (Both            Spectral and Color Flow Doppler were utilized during procedure). Indications:    Pericardial effusion I31.3  History:        Patient has prior history of Echocardiogram examinations, most                 recent 09/29/2023. CHF, CAD, COPD; Risk Factors:Hypertension,                 Diabetes and Dyslipidemia.  Sonographer:    Denese Finn RCS Referring Phys: [id number] Joyceann No BRANCH IMPRESSIONS  1. A small pericardial effusion is present. The pericardial effusion is circumferential. There is no evidence of cardiac tamponade.The effusion has decreased in size since September 29, 2023 study  2. Limited study to evaluate pericardial effusion FINDINGS  Left Ventricle: Pericardium: A small pericardial effusion is present. The pericardial effusion is circumferential. There is no evidence of cardiac tamponade. LEFT VENTRICLE PLAX 2D LVIDd:         5.20 cm LVIDs:         3.60 cm LV PW:         1.40 cm LV IVS:        1.20 cm LVOT diam:     2.70 cm LVOT Area:     5.73 cm   RIGHT VENTRICLE TAPSE (M-mode): 3.0 cm LEFT ATRIUM         Index LA diam:    4.30 cm 1.80 cm/m   AORTA Ao Root diam: 4.20 cm MITRAL VALVE MV Area (PHT): 1.79 cm     SHUNTS MV Decel Time: 423 msec     Systemic Diam: 2.70 cm MV E velocity: 124.00 cm/s MV A velocity: 130.00 cm/s MV E/A ratio:  0.95 Armida Lander MD Electronically signed by Armida Lander MD Signature Date/Time: 11/26/2023/4:32:33 PM    Final      Subjective: Pt says he is feeling a lot better after transfusions.  He says that he has not seen melanotic stools since admission.  He says he wants to go home today.  Says he can go to PCP to have Hg rechecked in a couple of days.   Discharge Exam: Vitals:   12/26/23 0428 12/26/23 0840  BP: (!) 118/94   Pulse: 83   Resp: 18   Temp: 98 F (36.7 C)   SpO2: 96% 90%   Vitals:   12/25/23 2350 12/26/23 0215 12/26/23 0428 12/26/23 0840  BP: (!) 113/42 (!) 119/59 (!) 118/94   Pulse: 81 76 83  Resp: 19 17 18    Temp: 98.3 F (36.8 C) 98.8 F (37.1 C) 98 F (36.7 C)   TempSrc: Oral Oral Oral   SpO2: 98% 100% 96% 90%  Weight:      Height:       General: Pt is alert, awake, not in acute distress Cardiovascular: normal S1/S2 +, no rubs, no gallops Respiratory: CTA bilaterally, no wheezing, no rhonchi Abdominal: Soft, NT, ND, bowel sounds + Extremities: trace bilateral pretibial and ankle edema, no cyanosis   The results of significant diagnostics from this hospitalization (including imaging, microbiology, ancillary and laboratory) are listed below for reference.     Microbiology: No results found for this or any previous visit (from the past 240 hours).   Labs: BNP (last 3 results) Recent Labs    09/25/23 2136  BNP 637.0*   Basic Metabolic Panel: Recent Labs  Lab 12/25/23 0937 12/26/23 0440  NA 138 137  K 4.2 4.3  CL 102 102  CO2 27 26  GLUCOSE 253* 161*  BUN 17 15  CREATININE 1.26* 1.29*  CALCIUM 9.1 8.9  MG  --  1.9   Liver Function Tests: Recent Labs   Lab 12/25/23 0937  AST 14*  ALT 9  ALKPHOS 72  BILITOT 0.3  PROT 7.1  ALBUMIN 3.3*   No results for input(s): "LIPASE", "AMYLASE" in the last 168 hours. No results for input(s): "AMMONIA" in the last 168 hours. CBC: Recent Labs  Lab 12/25/23 0937 12/26/23 0440  WBC 11.6* 13.6*  HGB 6.2* 7.3*  HCT 25.0* 27.5*  MCV 81.4 82.1  PLT 184 182   Cardiac Enzymes: No results for input(s): "CKTOTAL", "CKMB", "CKMBINDEX", "TROPONINI" in the last 168 hours. BNP: Invalid input(s): "POCBNP" CBG: Recent Labs  Lab 12/25/23 2145 12/25/23 2203 12/26/23 0242 12/26/23 0711 12/26/23 1127  GLUCAP 66* 76 157* 154* 229*   D-Dimer No results for input(s): "DDIMER" in the last 72 hours. Hgb A1c Recent Labs    12/25/23 1111  HGBA1C 6.9*   Lipid Profile No results for input(s): "CHOL", "HDL", "LDLCALC", "TRIG", "CHOLHDL", "LDLDIRECT" in the last 72 hours. Thyroid function studies No results for input(s): "TSH", "T4TOTAL", "T3FREE", "THYROIDAB" in the last 72 hours.  Invalid input(s): "FREET3" Anemia work up Recent Labs    12/25/23 1111  VITAMINB12 288  FOLATE 6.7  FERRITIN 6*  TIBC 525*  IRON 16*  RETICCTPCT 4.0*   Urinalysis    Component Value Date/Time   COLORURINE YELLOW 08/28/2021 1025   APPEARANCEUR CLEAR 08/28/2021 1025   LABSPEC 1.010 08/28/2021 1025   PHURINE 5.5 08/28/2021 1025   GLUCOSEU NEGATIVE 08/28/2021 1025   HGBUR LARGE (A) 08/28/2021 1025   BILIRUBINUR NEGATIVE 08/28/2021 1025   KETONESUR NEGATIVE 08/28/2021 1025   PROTEINUR NEGATIVE 08/28/2021 1025   NITRITE NEGATIVE 08/28/2021 1025   LEUKOCYTESUR NEGATIVE 08/28/2021 1025   Sepsis Labs Recent Labs  Lab 12/25/23 0937 12/26/23 0440  WBC 11.6* 13.6*   Microbiology No results found for this or any previous visit (from the past 240 hours).  Time coordinating discharge:  44 mins  SIGNED:  Faustino Hook, MD  [facility] 12/26/2023, 11:56 AM How to contact the [facility] Attending or  Consulting provider 7A - 7P or covering provider during after hours 7P -7A, for this patient?  Check the care team in [facility] and look for a) attending/consulting [facility] provider listed and b) the [facility] team listed Log into [email or web address] and use 's universal password to access. If you do not have  the password, please contact the hospital operator. Locate the [facility] provider you are looking for under [facility] and page to a number that you can be directly reached. If you still have difficulty reaching the provider, please page the [facility] (Director on Call) for the Hospitalists listed on amion for assistance.

## 2023-12-26 NOTE — Consult Note (Signed)
 Lavone Barrientes Faizan Noraa Pickeral, M.D. Gastroenterology & Hepatology                                           Patient Name: Gregory Alvarado Account #: @FLAACCTNO @   MRN: [id number] Admission Date: 12/25/2023 Date of Evaluation:  12/26/2023 Time of Evaluation: 11:33 AM  Chief Complaint:  Anemia  HPI:  This is a 73 y.o. 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  GI is consulted for recurrent anemia.  Patient reports that he has been intermittently noticing black-colored stools at home.  Has not been taking iron as directed previously but recently started taking p.o. iron.  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  Hemoccult test positive.  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  Past Medical History: SEE CHRONIC ISSSUES: Past Medical History:  Diagnosis Date   Anemia    Arthritis    left shoulder   COPD (chronic obstructive pulmonary disease) (HCC)    Coronary artery disease 09/11/2020   NSTEMI and 3  stents in [facility]   Diabetes mellitus without complication (HCC)    Dyslipidemia    Hypertension    Insomnia    Insomnia    Morbid obesity (HCC)    Tobacco use    Past Surgical History:  Past Surgical History:  Procedure Laterality Date   BIOPSY  10/28/2019   Procedure: BIOPSY;  Surgeon: Alyce Jubilee, MD;  [facility];  Service: Endoscopy;;  gastric   BIOPSY  04/15/2020   Procedure: BIOPSY;  Surgeon: Suzette Espy, MD;  [facility];  Service: Endoscopy;;  gastric   CARDIAC CATHETERIZATION  08/2020   3 stents placed   COLON RESECTION  1990s?   DIVERTICULITIS   COLONOSCOPY WITH PROPOFOL N/A 04/15/2020   Rourk: Poor colon prep. two 6 to 8 mm polyps removed from the hepatic flexure, tubular adenomas.  Repeat colonoscopy later this year.   ENTEROSCOPY N/A 11/26/2021   Procedure: ENTEROSCOPY;  Surgeon: Urban Garden, MD;  [facility];  Service: Gastroenterology;  Laterality: N/A;   ENTEROSCOPY N/A 03/13/2022   Procedure: ENTEROSCOPY;  Surgeon: Vinetta Greening, DO;  [facility];  Service: Endoscopy;  Laterality: N/A;   ENTEROSCOPY N/A 09/28/2023   Procedure: ENTEROSCOPY;  Surgeon: Urban Garden, MD;  [facility];  Service: Gastroenterology;  Laterality: N/A;   ESOPHAGOGASTRODUODENOSCOPY (EGD) WITH PROPOFOL N/A 10/28/2019   Dr. Nolene Baumgarten: Multiple cratered gastric ulcers, gastritis/duodenitis.  Path showed H. pylori.  Patient treated with amoxicillin/Biaxin/PPI twice daily.   ESOPHAGOGASTRODUODENOSCOPY (EGD) WITH PROPOFOL N/A 04/15/2020   Rourk: Patchy gastric erythema status post biopsy to document eradication of H. pylori.  Biopsy showed persistent H. pylori.  Previously noted gastric ulcer is completely healed.   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   ESOPHAGOGASTRODUODENOSCOPY (EGD) WITH  PROPOFOL N/A 03/13/2022   Procedure: ESOPHAGOGASTRODUODENOSCOPY (EGD) WITH PROPOFOL;  Surgeon: Vinetta Greening, DO;  [facility];  Service: Endoscopy;  Laterality: N/A;   HERNIA REPAIR     UHR   HOT HEMOSTASIS  11/26/2021   Procedure: HOT HEMOSTASIS (ARGON PLASMA COAGULATION/BICAP);  Surgeon: Umberto Ganong, Bearl Limes, MD;  [facility];  Service: Gastroenterology;;   HOT HEMOSTASIS  09/28/2023   Procedure: HOT HEMOSTASIS (ARGON PLASMA COAGULATION/BICAP);  Surgeon: Umberto Ganong, Bearl Limes, MD;  [facility];  Service: Gastroenterology;;   Londa Rival DILATION N/A 04/15/2020   Procedure: Dorothyann Gather;  Surgeon: Suzette Espy, MD;  [facility];  Service: Endoscopy;  Laterality: N/A;   POLYPECTOMY  04/15/2020   Procedure: POLYPECTOMY;  Surgeon: Suzette Espy, MD;  [facility];  Service: Endoscopy;;  colon   ROTATOR CUFF REPAIR Bilateral    TYMPANOPLASTY     Family History:  Family History  Problem Relation Age of Onset   Diabetes Mother    Heart attack Mother    Heart attack Father    Heart attack Brother    Colon cancer Neg Hx    Stomach cancer Neg Hx    Social History:  Social History   Tobacco Use   Smoking status: Every Day    Current packs/day: 2.00    Types: Cigarettes   Smokeless tobacco: Never  Vaping Use   Vaping status: Never Used  Substance Use Topics   Alcohol use: Not Currently    Comment: very rare   Drug use: No    Home Medications:  Prior to Admission medications   Medication Sig Start Date End Date Taking? Authorizing Provider  acetaminophen (TYLENOL) 500 MG tablet Take 1,000 mg by mouth every 6 (six) hours as needed for mild pain.   Yes [provider]  BREZTRI AEROSPHERE 160-9-4.8 MCG/ACT AERO Inhale 2 puffs into the lungs 2 (two) times daily. 08/26/23  Yes [provider]  cyanocobalamin (,VITAMIN B-12,) 1000 MCG/ML injection Inject 1,000 mcg into the muscle every 30 (thirty)  days. 02/11/22  Yes [provider]  diphenhydramine-acetaminophen (TYLENOL PM) 25-500 MG TABS tablet Take 2 tablets by mouth at bedtime.   Yes [provider]  empagliflozin (JARDIANCE) 10 MG TABS tablet Take 1 tablet (10 mg total) by mouth daily. 08/01/22  Yes Sheikh, Omair Latif, DO  insulin NPH-regular Human (NOVOLIN 70/30) (70-30) 100 UNIT/ML injection Inject 40 Units into the skin in the morning and at bedtime.   Yes [provider]  metoprolol succinate (TOPROL-XL) 25 MG 24 hr tablet Take 1 tablet (25 mg total) by mouth 2 (two) times daily. 12/16/23  Yes Branch, Joyceann No, MD  nitroGLYCERIN (NITROSTAT) 0.4 MG SL tablet DISSOLVE 1 TABLET SUBLINGUALLY AS NEEDED FOR CHEST PAIN, MAY REPEAT EVERY 5 MINUTES. AFTER 3 CALL 911. Patient taking differently: Place 0.4 mg under the tongue every 5 (five) minutes as needed for chest pain. 11/30/23  Yes Laurann Pollock, MD  pantoprazole (PROTONIX) 40 MG tablet Take 1 tablet (  40 mg total) by mouth 2 (two) times daily. 09/30/23 09/29/24 Yes Justina Oman, MD  pregabalin (LYRICA) 25 MG capsule Take 25 mg by mouth 2 (two) times daily. 10/27/23  Yes [provider]  rosuvastatin (CRESTOR) 40 MG tablet Take 1 tablet (40 mg total) by mouth daily. 05/07/23 05/01/24 Yes BranchJoyceann No, MD  spironolactone (ALDACTONE) 25 MG tablet Take 0.5 tablets (12.5 mg total) by mouth daily. 10/01/23  Yes Justina Oman, MD  traMADol (ULTRAM) 50 MG tablet Take 50-100 mg by mouth in the morning and at bedtime. 09/20/19  Yes [provider]  zolpidem (AMBIEN) 5 MG tablet Take 5 mg by mouth at bedtime. 09/26/20  Yes [provider]  levocetirizine (XYZAL) 5 MG tablet Take 5 mg by mouth at bedtime. 03/11/22   [provider]  octreotide (SANDOSTATIN) 100 MCG/ML SOLN injection Inject 1 mL (100 mcg total) into the skin every 12 (twelve) hours. Patient not taking: Reported on 12/25/2023 11/19/23 02/17/24  Evander Hills, PA-C   Syringe/Needle, Disp, (SYRINGE 3CC/25GX5/8") 25G X 5/8" 3 ML MISC Inject 1 mL in the morning and at bedtime. 11/19/23   Evander Hills, PA-C    Inpatient Medications:  Current Facility-Administered Medications:    acetaminophen (TYLENOL) tablet 650 mg, 650 mg, Oral, Q6H PRN, 650 mg at 12/26/23 0154 **OR** acetaminophen (TYLENOL) suppository 650 mg, 650 mg, Rectal, Q6H PRN, Johnson, Clanford L, MD   budeson-glycopyrrolate-formoterol (BREZTRI) 160-9-4.8 MCG/ACT inhaler 2 puff, 2 puff, Inhalation, BID, Johnson, Clanford L, MD, 2 puff at 12/26/23 0836   empagliflozin (JARDIANCE) tablet 10 mg, 10 mg, Oral, Daily, Johnson, Clanford L, MD, 10 mg at 12/26/23 0838   fentaNYL (SUBLIMAZE) injection 12.5 mcg, 12.5 mcg, Intravenous, Q2H PRN, Johnson, Clanford L, MD   insulin aspart (novoLOG) injection 0-15 Units, 0-15 Units, Subcutaneous, TID WC, Johnson, Clanford L, MD, 3 Units at 12/26/23 0836   insulin aspart protamine- aspart (NOVOLOG MIX 70/30) injection 8 Units, 8 Units, Subcutaneous, BID WC, Johnson, Clanford L, MD, 8 Units at 12/26/23 0836   loratadine (CLARITIN) tablet 10 mg, 10 mg, Oral, QHS, Johnson, Clanford L, MD, 10 mg at 12/25/23 2251   metoprolol succinate (TOPROL-XL) 24 hr tablet 25 mg, 25 mg, Oral, BID, Johnson, Clanford L, MD, 25 mg at 12/26/23 0838   nitroGLYCERIN (NITROSTAT) SL tablet 0.4 mg, 0.4 mg, Sublingual, Q5 min PRN, Johnson, Clanford L, MD   ondansetron (ZOFRAN) tablet 4 mg, 4 mg, Oral, Q6H PRN **OR** ondansetron (ZOFRAN) injection 4 mg, 4 mg, Intravenous, Q6H PRN, Johnson, Clanford L, MD   Oral care mouth rinse, 15 mL, Mouth Rinse, PRN, Johnson, Clanford L, MD   oxyCODONE (Oxy IR/ROXICODONE) immediate release tablet 5 mg, 5 mg, Oral, Q6H PRN, Johnson, Clanford L, MD, 5 mg at 12/25/23 2251   pantoprazole (PROTONIX) EC tablet 40 mg, 40 mg, Oral, BID WC, Johnson, Clanford L, MD, 40 mg at 12/26/23 0838   pregabalin (LYRICA) capsule 25 mg, 25 mg, Oral, BID, Johnson, Clanford L, MD, 25  mg at 12/26/23 1007   prochlorperazine (COMPAZINE) injection 10 mg, 10 mg, Intravenous, Q4H PRN, Johnson, Clanford L, MD   rosuvastatin (CRESTOR) tablet 40 mg, 40 mg, Oral, q1800, Johnson, Clanford L, MD, 40 mg at 12/25/23 1649   spironolactone (ALDACTONE) tablet 12.5 mg, 12.5 mg, Oral, Daily, Johnson, Clanford L, MD, 12.5 mg at 12/26/23 0838   traZODone (DESYREL) tablet 25 mg, 25 mg, Oral, QHS PRN, Lincoln Renshaw, Clanford L, MD, 25 mg at 12/25/23 2251 Allergies: Lisinopril and Caduet [  amlodipine-atorvastatin]  Complete Review of Systems: GENERAL: negative for malaise, night sweats HEENT: No changes in hearing or vision, no nose bleeds or other nasal problems. NECK: Negative for lumps, goiter, pain and significant neck swelling RESPIRATORY: Negative for cough, wheezing CARDIOVASCULAR: Negative for chest pain, leg swelling, palpitations, orthopnea GI: SEE HPI MUSCULOSKELETAL: Negative for joint pain or swelling, back pain, and muscle pain. SKIN: Negative for lesions, rash PSYCH: Negative for sleep disturbance, mood disorder and recent psychosocial stressors. HEMATOLOGY Negative for prolonged bleeding, bruising easily, and swollen nodes. ENDOCRINE: Negative for cold or heat intolerance, polyuria, polydipsia and goiter. NEURO: negative for tremor, gait imbalance, syncope and seizures. The remainder of the review of systems is noncontributory.  Physical Exam: BP (!) 118/94 (BP Location: Left Arm)   Pulse 83   Temp 98 F (36.7 C) (Oral)   Resp 18   Ht 6' (1.829 m)   Wt 127.1 kg   SpO2 90%   BMI 38.00 kg/m  GENERAL: The patient is AO x3, in no acute distress. HEENT: Head is normocephalic and atraumatic. EOMI are intact. Mouth is well hydrated and without lesions. NECK: Supple. No masses LUNGS: Clear to auscultation. No presence of rhonchi/wheezing/rales. Adequate chest expansion HEART: RRR, normal s1 and s2. ABDOMEN: Soft, nontender, no guarding, no peritoneal signs, and nondistended. BS +.  No masses. EXTREMITIES: Without any cyanosis, clubbing, rash, lesions or edema. NEUROLOGIC: AOx3, no focal motor deficit. SKIN: no jaundice, no rashes  Laboratory Data CBC:     Component Value Date/Time   WBC 13.6 (H) 12/26/2023 0440   RBC 3.35 (L) 12/26/2023 0440   HGB 7.3 (L) 12/26/2023 0440   HGB 13.7 06/09/2017 1141   HCT 27.5 (L) 12/26/2023 0440   PLT 182 12/26/2023 0440   MCV 82.1 12/26/2023 0440   MCH 21.8 (L) 12/26/2023 0440   MCHC 26.5 (L) 12/26/2023 0440   RDW 19.4 (H) 12/26/2023 0440   LYMPHSABS 0.9 09/25/2023 2136   MONOABS 0.8 09/25/2023 2136   EOSABS 0.1 09/25/2023 2136   BASOSABS 0.1 09/25/2023 2136   COAG:  Lab Results  Component Value Date   INR 1.1 11/24/2021   INR 1.0 08/28/2021   INR 1.1 10/26/2019    BMP:     Latest Ref Rng & Units 12/26/2023    4:40 AM 12/25/2023    9:37 AM 11/10/2023    4:42 AM  BMP  Glucose 70 - 99 mg/dL 161  096  045   BUN 8 - 23 mg/dL 15  17  20    Creatinine 0.61 - 1.24 mg/dL 4.09  8.11  9.14   Sodium 135 - 145 mmol/L 137  138  136   Potassium 3.5 - 5.1 mmol/L 4.3  4.2  4.1   Chloride 98 - 111 mmol/L 102  102  102   CO2 22 - 32 mmol/L 26  27  26    Calcium 8.9 - 10.3 mg/dL 8.9  9.1  9.1     HEPATIC:     Latest Ref Rng & Units 12/25/2023    9:37 AM 11/10/2023    4:42 AM 11/09/2023    7:18 PM  Hepatic Function  Total Protein 6.5 - 8.1 g/dL 7.1  6.7  6.7   Albumin 3.5 - 5.0 g/dL 3.3  3.3  3.3   AST 15 - 41 U/L 14  10  11    ALT 0 - 44 U/L 9  8  8    Alk Phosphatase 38 - 126 U/L 72  57  63  Total Bilirubin 0.0 - 1.2 mg/dL 0.3  0.5  0.5     CARDIAC:  Lab Results  Component Value Date   TROPONINI <0.30 11/21/2012     Imaging: I personally reviewed and interpreted the available imaging.  Assessment & Plan: This is a 73 y.o. male with history of chronic systolic and diastolic heart failure, coronary artery disease, type 2 diabetes, chronic respiratory failure on supplemental oxygen at 2 to 3 L, recurrent GI bleed due to  angiectasia presented with anemia.  GI is consulted for recurrent anemia  #Acute on chronic anemia #Severe iron deficiency anemie  Patient with intermittent ongoing dark stool at home but no active overt bleeding here in the hospital.  I myself examined patient's stool this afternoon which was brown and formed in color/consistency (no overt bleeding or melena)  Patient was recommended to start octreotide for angiodysplastic lesion but was not able to afford due to the cost  I suspect that we are again dealing with occult bleeding for small bowel angiodysplastic lesion and endoscopic evaluation without active overt bleeding will be of very low yield  There is also component of underproduction of erythrocytes as patient iron stores has not been adequate for at least past 2 years with ferritin of only 6  Would recommend conservative management for now with transfusion, iron supplementation and in hopes to attempt starting octreotide, I see recently paperwork patient assistance was completed by one of our PAs  Recommendation:  Recommend PPI daily  Octreotide 100 mg subcu twice daily  Transfuse to keep hemoglobin more than 7  Avoid NSAIDs  IV iron loading while admitted and follow-up with hematology to restore adequate iron stores;  as restoring iron stores is imperative  If patient continues to be anemic despite iron supplementation at that time would benefit from capsule endoscopy as outpatient .  Rylea Selway Faizan Dujuan Stankowski, MD Gastroenterology and Hepatology [facility] Gastroenterology  This chart has been completed using [facility] Dictation software, and while attempts have been made to ensure accuracy , certain words and phrases may not be transcribed as intended

## 2023-12-26 NOTE — Care Management Obs Status (Signed)
 MEDICARE OBSERVATION STATUS NOTIFICATION   Patient Details  Name: Gregory Alvarado MRN: [id number] Date of Birth: [birth year]   Medicare Observation Status Notification Given:   Yes.    Lynda Sands, RN 12/26/2023, 7:34 AM

## 2023-12-26 NOTE — Care Management Obs Status (Signed)
 MEDICARE OBSERVATION STATUS NOTIFICATION   Patient Details  Name: VINAL ROSENGRANT MRN: [id number] Date of Birth: 01/11/1951   Medicare Observation Status Notification Given:   Yes.    Lynda Sands, RN 12/26/2023, 12:33 PM

## 2023-12-26 NOTE — Plan of Care (Signed)
  Problem: Education: Goal: Knowledge of General Education information will improve Description: Including pain rating scale, medication(s)/side effects and non-pharmacologic comfort measures Outcome: Progressing   Problem: Health Behavior/Discharge Planning: Goal: Ability to manage health-related needs will improve Outcome: Progressing   Problem: Clinical Measurements: Goal: Ability to maintain clinical measurements within normal limits will improve Outcome: Progressing Goal: Will remain free from infection Outcome: Progressing   Problem: Activity: Goal: Risk for activity intolerance will decrease Outcome: Progressing   Problem: Nutrition: Goal: Adequate nutrition will be maintained Outcome: Progressing   Problem: Elimination: Goal: Will not experience complications related to bowel motility Outcome: Progressing   Problem: Pain Managment: Goal: General experience of comfort will improve and/or be controlled Outcome: Progressing   Problem: Safety: Goal: Ability to remain free from injury will improve Outcome: Progressing   Problem: Skin Integrity: Goal: Risk for impaired skin integrity will decrease Outcome: Progressing

## 2023-12-26 NOTE — Progress Notes (Signed)
   12/26/23 1410  TOC Brief Assessment  Insurance and Status Reviewed  Patient has primary care physician Yes  Home environment has been reviewed Single family home  Prior level of function: Independent  Prior/Current Home Services Current home services (Home Oxygen)  Transition of care needs transition of care needs identified, TOC will continue to follow   Transition of Care Department (TOC) has reviewed patient and no TOC needs have been identified at this time. We will continue to monitor patient advancement through interdisciplinary progression rounds. If new patient transition needs arise, please place a TOC consult.

## 2023-12-26 NOTE — Discharge Instructions (Signed)
 IMPORTANT INFORMATION: PAY CLOSE ATTENTION   PHYSICIAN DISCHARGE INSTRUCTIONS  Follow with Primary care provider  Benita Stabile, MD  and other consultants as instructed by your Hospitalist Physician  SEEK MEDICAL CARE OR RETURN TO EMERGENCY ROOM IF SYMPTOMS COME BACK, WORSEN OR NEW PROBLEM DEVELOPS   Please note: You were cared for by a hospitalist during your hospital stay. Every effort will be made to forward records to your primary care provider.  You can request that your primary care provider send for your hospital records if they have not received them.  Once you are discharged, your primary care physician will handle any further medical issues. Please note that NO REFILLS for any discharge medications will be authorized once you are discharged, as it is imperative that you return to your primary care physician (or establish a relationship with a primary care physician if you do not have one) for your post hospital discharge needs so that they can reassess your need for medications and monitor your lab values.  Please get a complete blood count and chemistry panel checked by your Primary MD at your next visit, and again as instructed by your Primary MD.  Get Medicines reviewed and adjusted: Please take all your medications with you for your next visit with your Primary MD  Laboratory/radiological data: Please request your Primary MD to go over all hospital tests and procedure/radiological results at the follow up, please ask your primary care provider to get all Hospital records sent to his/her office.  In some cases, they will be blood work, cultures and biopsy results pending at the time of your discharge. Please request that your primary care provider follow up on these results.  If you are diabetic, please bring your blood sugar readings with you to your follow up appointment with primary care.    Please call and make your follow up appointments as soon as possible.    Also Note the  following: If you experience worsening of your admission symptoms, develop shortness of breath, life threatening emergency, suicidal or homicidal thoughts you must seek medical attention immediately by calling 911 or calling your MD immediately  if symptoms less severe.  You must read complete instructions/literature along with all the possible adverse reactions/side effects for all the Medicines you take and that have been prescribed to you. Take any new Medicines after you have completely understood and accpet all the possible adverse reactions/side effects.   Do not drive when taking Pain medications or sleeping medications (Benzodiazepines)  Do not take more than prescribed Pain, Sleep and Anxiety Medications. It is not advisable to combine anxiety,sleep and pain medications without talking with your primary care practitioner  Special Instructions: If you have smoked or chewed Tobacco  in the last 2 yrs please stop smoking, stop any regular Alcohol  and or any Recreational drug use.  Wear Seat belts while driving.  Do not drive if taking any narcotic, mind altering or controlled substances or recreational drugs or alcohol.

## 2023-12-29 LAB — BPAM RBC
Blood Product Expiration Date: 202504282359
Blood Product Unit Number: 202505112359
Blood Product Unit Number: 202505112359
Blood Product Unit Number: 202505182359
ISSUE DATE / TIME: 202504122320
PRODUCT CODE: 202504121626
PRODUCT CODE: 202504122320
PRODUCT CODE: 202505182359
Unit Type and Rh: 202504292359
Unit Type and Rh: 202504292359
Unit Type and Rh: 202505182359
Unit Type and Rh: 202505182359
Unit Type and Rh: 5100
Unit Type and Rh: 5100
Unit Type and Rh: 5100
Unit Type and Rh: 6200
Unit Type and Rh: 6200

## 2023-12-29 LAB — TYPE AND SCREEN
ABO/RH(D): A POS
Antibody Screen: POSITIVE
Donor AG Type: NEGATIVE
Donor AG Type: NEGATIVE
Donor AG Type: NEGATIVE
Donor AG Type: NEGATIVE
Unit division: 0
Unit division: 0
Unit division: 0
Unit division: 0

## 2024-01-02 NOTE — Telephone Encounter (Signed)
Can we follow-up on this?

## 2024-01-04 NOTE — Telephone Encounter (Signed)
 Lmom for pt to return call

## 2024-01-05 DIAGNOSIS — J449 Chronic obstructive pulmonary disease, unspecified: Secondary | ICD-10-CM | POA: Diagnosis not present

## 2024-01-07 NOTE — Telephone Encounter (Signed)
 Called [employer] and the form need to be filled out again and signed. The medication had to be filled in by the brand name. I placed a new form on your desk to be signed.

## 2024-01-07 NOTE — Telephone Encounter (Signed)
 Noted. I faxed new forms

## 2024-01-07 NOTE — Telephone Encounter (Signed)
 Signed and returned to Donel Fujisawa, CMA.

## 2024-01-08 NOTE — Progress Notes (Unsigned)
 Referring Provider: Omie Bickers, MD Primary Care Physician:  Omie Bickers, MD Primary GI Physician: Dr. Mordechai April  No chief complaint on file.   HPI:   Gregory Alvarado is a 73 y.o. 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  He presented with a hemoglobin of 6.2 with low iron  and low normal B12 at 288.  Hemoccult was positive.  He received 2 unit PRBCs with hemoglobin improved to 7.3.  Recommended conservative management, PPI twice daily, hope to start octreotide  soon, transfuse to keep hemoglobin more than 7, IV iron  while admitted and follow-up with hematology.  Recommended considering capsule if patient continued to be anemic despite iron  supplementation.   Today:      Hematology referral***     Prior GI work-up: Colonoscopy August 2021 -inadequate preparation of the colon, two 6/8 mm polyps at the hepatic flexure   EGD August 2021 - normal esophagus s/p dilation, patchy gastric erythema, previously noted gastric ulcer completely healed, normal duodenum   EGD/enteroscopy November 2021 - multiple angioectasias in the duodenum and jejunum s/p APC therapy   EGD March 2023 - single recent bleeding angiodysplastic lesion in the stomach, 6 nonbleeding lesions in the duodenum, 6 nonbleeding lesions in the jejunum all treated with APC therapy.  Enteroscopy 09/2023: 3 nonbleeding angiodysplastic lesions in the stomach treated with APC, 8 nonbleeding angiodysplastic lesions in the duodenum treated with APC.   Past Medical History:  Diagnosis Date   Anemia    Arthritis     left shoulder   COPD (chronic obstructive pulmonary disease) (HCC)    Coronary artery disease 09/11/2020   NSTEMI and 3 stents in [facility]   Diabetes mellitus without complication (HCC)    Dyslipidemia    Hypertension    Insomnia    Insomnia    Morbid obesity (HCC)    Tobacco use     Past Surgical History:  Procedure Laterality Date   BIOPSY  10/28/2019   Procedure: BIOPSY;  Surgeon: Alyce Jubilee, MD;  [facility];  Service: Endoscopy;;  gastric   BIOPSY  04/15/2020   Procedure: BIOPSY;  Surgeon: Suzette Espy, MD;  [facility];  Service: Endoscopy;;  gastric   CARDIAC CATHETERIZATION  08/2020   3 stents placed   COLON RESECTION  1990s?   DIVERTICULITIS   COLONOSCOPY WITH PROPOFOL  N/A 04/15/2020   Rourk: Poor colon prep. two 6 to 8 mm polyps removed from the hepatic flexure, tubular adenomas.  Repeat colonoscopy later this year.   ENTEROSCOPY N/A 11/26/2021   Procedure: ENTEROSCOPY;  Surgeon: Urban Garden, MD;  [facility];  Service: Gastroenterology;  Laterality: N/A;   ENTEROSCOPY N/A 03/13/2022   Procedure: ENTEROSCOPY;  Surgeon: Vinetta Greening, DO;  [facility];  Service: Endoscopy;  Laterality: N/A;   ENTEROSCOPY N/A 09/28/2023   Procedure: ENTEROSCOPY;  Surgeon: Urban Garden, MD;  [facility];  Service: Gastroenterology;  Laterality: N/A;   ESOPHAGOGASTRODUODENOSCOPY (EGD) WITH PROPOFOL  N/A 10/28/2019   Dr. Nolene Baumgarten: Multiple cratered gastric ulcers, gastritis/duodenitis.  Path showed H. pylori.  Patient treated with  amoxicillin /Biaxin /PPI twice daily.   ESOPHAGOGASTRODUODENOSCOPY (EGD) WITH PROPOFOL  N/A 04/15/2020   Rourk: Patchy gastric erythema status post biopsy to document eradication of H. pylori.  Biopsy showed persistent H. pylori.  Previously noted gastric ulcer is completely healed.   ESOPHAGOGASTRODUODENOSCOPY (EGD) WITH PROPOFOL  N/A 07/23/2020   Castaneda: small bowel  enteroscopy: 5 duodenal AVMs and 3 jejunal AVMs s/p APC ablation.   ESOPHAGOGASTRODUODENOSCOPY (EGD) WITH PROPOFOL  N/A 03/13/2022   Procedure: ESOPHAGOGASTRODUODENOSCOPY (EGD) WITH PROPOFOL ;  Surgeon: Vinetta Greening, DO;  [facility];  Service: Endoscopy;  Laterality: N/A;   HERNIA REPAIR     UHR   HOT HEMOSTASIS  11/26/2021   Procedure: HOT HEMOSTASIS (ARGON PLASMA COAGULATION/BICAP);  Surgeon: Umberto Ganong, Bearl Limes, MD;  [facility];  Service: Gastroenterology;;   HOT HEMOSTASIS  09/28/2023   Procedure: HOT HEMOSTASIS (ARGON PLASMA COAGULATION/BICAP);  Surgeon: Umberto Ganong, Bearl Limes, MD;  [facility];  Service: Gastroenterology;;   Londa Rival DILATION N/A 04/15/2020   Procedure: Dorothyann Gather;  Surgeon: Suzette Espy, MD;  [facility];  Service: Endoscopy;  Laterality: N/A;   POLYPECTOMY  04/15/2020   Procedure: POLYPECTOMY;  Surgeon: Suzette Espy, MD;  [facility];  Service: Endoscopy;;  colon   ROTATOR CUFF REPAIR Bilateral    TYMPANOPLASTY      Current Outpatient Medications  Medication Sig Dispense Refill   acetaminophen  (TYLENOL ) 500 MG tablet Take 1,000 mg by mouth every 6 (six) hours as needed for mild pain.     Ascorbic Acid (VITAMIN C ) 500 MG CAPS Take 500 mg by mouth daily. Take with iron  tablet for better iron  absorption. 30 capsule 1   BREZTRI  AEROSPHERE 160-9-4.8 MCG/ACT AERO Inhale 2 puffs into the lungs 2 (two) times daily.     cyanocobalamin  (,VITAMIN B-12,) 1000 MCG/ML injection Inject 1,000 mcg into the muscle every 30 (thirty) days.     diphenhydramine -acetaminophen  (TYLENOL  PM) 25-500 MG TABS tablet Take 2 tablets by mouth at bedtime.     empagliflozin  (JARDIANCE ) 10 MG TABS tablet Take 1 tablet (10 mg total) by mouth daily. 30 tablet 0   insulin  NPH-regular Human (70-30) 100 UNIT/ML injection Inject 15 Units into the skin in the morning and at bedtime.     Iron , Ferrous Sulfate , 325 (65 Fe) MG TABS  Take 1 tablet by mouth daily in the afternoon. 30 tablet 1   levocetirizine (XYZAL ) 5 MG tablet Take 5 mg by mouth at bedtime.     metoprolol  succinate (TOPROL -XL) 25 MG 24 hr tablet Take 1 tablet (25 mg total) by mouth 2 (two) times daily. 180 tablet 3   nitroGLYCERIN  (NITROSTAT ) 0.4 MG SL tablet DISSOLVE 1 TABLET SUBLINGUALLY AS NEEDED FOR CHEST PAIN, MAY REPEAT EVERY 5 MINUTES. AFTER 3 CALL 911. (Patient taking differently: Place 0.4 mg under the tongue every 5 (five) minutes as needed for chest pain.) 25 tablet 3   octreotide  (SANDOSTATIN ) 100 MCG/ML SOLN injection Inject 1 mL (100 mcg total) into the skin every 12 (twelve) hours. (Patient not taking: Reported on 12/25/2023) 90 mL 1   pantoprazole  (PROTONIX ) 40 MG tablet Take 1 tablet (40 mg total) by mouth 2 (two) times daily. 60 tablet 2   pregabalin  (LYRICA ) 25 MG capsule Take 25 mg by mouth 2 (two) times daily.     rosuvastatin  (CRESTOR ) 40 MG tablet Take 1 tablet (40 mg total) by mouth daily. 90 tablet 3   spironolactone  (ALDACTONE ) 25 MG tablet Take 0.5 tablets (12.5 mg total)  by mouth daily. 30 tablet 1   Syringe/Needle, Disp, (SYRINGE 3CC/25GX5/8") 25G X 5/8" 3 ML MISC Inject 1 mL in the morning and at bedtime. 100 each 3   traMADol  (ULTRAM ) 50 MG tablet Take 50-100 mg by mouth in the morning and at bedtime.     zolpidem  (AMBIEN ) 5 MG tablet Take 5 mg by mouth at bedtime.     No current facility-administered medications for this visit.    Allergies as of 01/10/2024 - Review Complete 12/25/2023  Allergen Reaction Noted   Lisinopril  Swelling 07/26/2022   Caduet [amlodipine-atorvastatin ] Swelling 09/18/2015    Family History  Problem Relation Age of Onset   Diabetes Mother    Heart attack Mother    Heart attack Father    Heart attack Brother    Colon cancer Neg Hx    Stomach cancer Neg Hx     Social History   Socioeconomic History   Marital status: Divorced    Spouse name: Not on file   Number of children: 2   Years of  education: Not on file   Highest education level: High school graduate  Occupational History   Occupation: Retired  Tobacco Use   Smoking status: Every Day    Current packs/day: 2.00    Types: Cigarettes   Smokeless tobacco: Never  Vaping Use   Vaping status: Never Used  Substance and Sexual Activity   Alcohol use: Not Currently    Comment: very rare   Drug use: No   Sexual activity: Not on file  Other Topics Concern   Not on file  Social History Narrative   Not on file   Social Drivers of Health   Financial Resource Strain: Low Risk  (08/06/2020)   Overall Financial Resource Strain (CARDIA)    Difficulty of Paying Living Expenses: Not very hard  Food Insecurity: No Food Insecurity (12/25/2023)   Hunger Vital Sign    Worried About Running Out of Food in the Last Year: Never true    Ran Out of Food in the Last Year: Never true  Transportation Needs: No Transportation Needs (12/25/2023)   PRAPARE - Administrator, Civil Service (Medical): No    Lack of Transportation (Non-Medical): No  Physical Activity: Inactive (08/06/2020)   Exercise Vital Sign    Days of Exercise per Week: 0 days    Minutes of Exercise per Session: 0 min  Stress: No Stress Concern Present (08/06/2020)   [employer] of Occupational Health - Occupational Stress Questionnaire    Feeling of Stress : Not at all  Social Connections: Socially Isolated (12/25/2023)   Social Connection and Isolation Panel [NHANES]    Frequency of Communication with Friends and Family: More than three times a week    Frequency of Social Gatherings with Friends and Family: More than three times a week    Attends Religious Services: Never    Database administrator or Organizations: No    Attends Banker Meetings: Never    Marital Status: Divorced    Review of Systems: Gen: Denies fever, chills, anorexia. Denies fatigue, weakness, weight loss.  CV: Denies chest pain, palpitations, syncope,  peripheral edema, and claudication. Resp: Denies dyspnea at rest, cough, wheezing, coughing up blood, and pleurisy. GI: Denies vomiting blood, jaundice, and fecal incontinence.   Denies dysphagia or odynophagia. Derm: Denies rash, itching, dry skin Psych: Denies depression, anxiety, memory loss, confusion. No homicidal or suicidal ideation.  Heme: Denies bruising, bleeding, and enlarged lymph  nodes.  Physical Exam: There were no vitals taken for this visit. General:   Alert and oriented. No distress noted. Pleasant and cooperative.  Head:  Normocephalic and atraumatic. Eyes:  Conjuctiva clear without scleral icterus. Heart:  S1, S2 present without murmurs appreciated. Lungs:  Clear to auscultation bilaterally. No wheezes, rales, or rhonchi. No distress.  Abdomen:  +BS, soft, non-tender and non-distended. No rebound or guarding. No HSM or masses noted. Msk:  Symmetrical without gross deformities. Normal posture. Extremities:  Without edema. Neurologic:  Alert and  oriented x4 Psych:  Normal mood and affect.    Assessment:     Plan:  ***   Shana Daring, PA-C [facility] 01/10/2024

## 2024-01-10 ENCOUNTER — Encounter: Payer: Self-pay | Admitting: Gastroenterology

## 2024-01-10 ENCOUNTER — Telehealth: Payer: Self-pay | Admitting: Gastroenterology

## 2024-01-10 ENCOUNTER — Ambulatory Visit: Admitting: Gastroenterology

## 2024-01-10 VITALS — BP 118/65 | HR 82 | Temp 98.0°F | Ht 72.0 in | Wt 277.0 lb

## 2024-01-10 DIAGNOSIS — K219 Gastro-esophageal reflux disease without esophagitis: Secondary | ICD-10-CM | POA: Diagnosis not present

## 2024-01-10 DIAGNOSIS — D509 Iron deficiency anemia, unspecified: Secondary | ICD-10-CM | POA: Diagnosis not present

## 2024-01-10 DIAGNOSIS — Z8719 Personal history of other diseases of the digestive system: Secondary | ICD-10-CM

## 2024-01-10 DIAGNOSIS — K922 Gastrointestinal hemorrhage, unspecified: Secondary | ICD-10-CM

## 2024-01-10 DIAGNOSIS — D500 Iron deficiency anemia secondary to blood loss (chronic): Secondary | ICD-10-CM

## 2024-01-10 DIAGNOSIS — K5520 Angiodysplasia of colon without hemorrhage: Secondary | ICD-10-CM

## 2024-01-10 MED ORDER — PANTOPRAZOLE SODIUM 40 MG PO TBEC: 40.0000 mg | DELAYED_RELEASE_TABLET | Freq: Two times a day (BID) | ORAL | 5 refills | Status: DC

## 2024-01-10 NOTE — Patient Instructions (Signed)
 Please have labs completed at [facility].   Continue pantoprazole  40 mg twice daily before meals.  Continue taking iron   Avoid all NSAID products including ibuprofen, Aleve , Advil, BC powders, Goody powders, and anything that says but NSAID" in the package.  We are still trying to get octreotide  for you.  Hopefully, we will hear something back by the end of this week.  I will plan to see you back in the office in 8 weeks, but we will plan to continue to keep a close check on your hemoglobin.   Shana Daring, PA-C [facility]

## 2024-01-10 NOTE — Telephone Encounter (Signed)
 Pt left office before making fu 8 week appt tried to call left message

## 2024-01-11 NOTE — Telephone Encounter (Signed)
 Received a letter from [employer], they needs move information. I have called a left a message for pt to call office. I placed a copy on your desk.

## 2024-01-12 DIAGNOSIS — D509 Iron deficiency anemia, unspecified: Secondary | ICD-10-CM | POA: Diagnosis not present

## 2024-01-12 DIAGNOSIS — I10 Essential (primary) hypertension: Secondary | ICD-10-CM | POA: Diagnosis not present

## 2024-01-12 DIAGNOSIS — I5042 Chronic combined systolic (congestive) and diastolic (congestive) heart failure: Secondary | ICD-10-CM | POA: Diagnosis not present

## 2024-01-12 DIAGNOSIS — J449 Chronic obstructive pulmonary disease, unspecified: Secondary | ICD-10-CM | POA: Diagnosis not present

## 2024-01-12 NOTE — Telephone Encounter (Signed)
 Left message for pt to call office again.

## 2024-01-12 NOTE — Telephone Encounter (Signed)
 Reviewed. There is additional financial information needed from patient and patient will need to apply for LIS program if he qualifies based on income and have denial letter for patient assistance to take over. Returned paperwork to [place] so she can reach patient about this.

## 2024-01-14 NOTE — Telephone Encounter (Signed)
 LMOM for pt to call office

## 2024-01-18 NOTE — Telephone Encounter (Signed)
 LMOM for pt to call office

## 2024-02-04 DIAGNOSIS — K5520 Angiodysplasia of colon without hemorrhage: Secondary | ICD-10-CM | POA: Diagnosis not present

## 2024-02-04 DIAGNOSIS — D500 Iron deficiency anemia secondary to blood loss (chronic): Secondary | ICD-10-CM | POA: Diagnosis not present

## 2024-02-04 DIAGNOSIS — J449 Chronic obstructive pulmonary disease, unspecified: Secondary | ICD-10-CM | POA: Diagnosis not present

## 2024-02-05 LAB — CBC WITH DIFFERENTIAL/PLATELET
Basophils Absolute: 0.1 10*3/uL (ref 0.0–0.2)
Basos: 1 %
EOS (ABSOLUTE): 0.5 10*3/uL — ABNORMAL HIGH (ref 0.0–0.4)
Eos: 6 %
Hematocrit: 39.8 % (ref 37.5–51.0)
Hemoglobin: 12 g/dL — ABNORMAL LOW (ref 13.0–17.7)
Immature Grans (Abs): 0 10*3/uL (ref 0.0–0.1)
Immature Granulocytes: 1 %
Lymphocytes Absolute: 1.2 10*3/uL (ref 0.7–3.1)
Lymphs: 15 %
MCH: 28.7 pg (ref 26.6–33.0)
MCHC: 30.2 g/dL — ABNORMAL LOW (ref 31.5–35.7)
MCV: 95 fL (ref 79–97)
Monocytes Absolute: 0.7 10*3/uL (ref 0.1–0.9)
Monocytes: 8 %
Neutrophils Absolute: 5.8 10*3/uL (ref 1.4–7.0)
Neutrophils: 69 %
Platelets: 135 10*3/uL — ABNORMAL LOW (ref 150–450)
RBC: 4.18 x10E6/uL (ref 4.14–5.80)
RDW: 18.6 % — ABNORMAL HIGH (ref 11.6–15.4)
WBC: 8.2 10*3/uL (ref 3.4–10.8)

## 2024-02-06 ENCOUNTER — Ambulatory Visit: Payer: Self-pay | Admitting: Gastroenterology

## 2024-02-10 DIAGNOSIS — E782 Mixed hyperlipidemia: Secondary | ICD-10-CM | POA: Diagnosis not present

## 2024-02-10 DIAGNOSIS — E1169 Type 2 diabetes mellitus with other specified complication: Secondary | ICD-10-CM | POA: Diagnosis not present

## 2024-02-10 DIAGNOSIS — J449 Chronic obstructive pulmonary disease, unspecified: Secondary | ICD-10-CM | POA: Diagnosis not present

## 2024-02-10 DIAGNOSIS — G629 Polyneuropathy, unspecified: Secondary | ICD-10-CM | POA: Diagnosis not present

## 2024-02-10 DIAGNOSIS — I5042 Chronic combined systolic (congestive) and diastolic (congestive) heart failure: Secondary | ICD-10-CM | POA: Diagnosis not present

## 2024-02-10 DIAGNOSIS — K219 Gastro-esophageal reflux disease without esophagitis: Secondary | ICD-10-CM | POA: Diagnosis not present

## 2024-02-10 DIAGNOSIS — K5909 Other constipation: Secondary | ICD-10-CM | POA: Diagnosis not present

## 2024-02-10 DIAGNOSIS — J302 Other seasonal allergic rhinitis: Secondary | ICD-10-CM | POA: Diagnosis not present

## 2024-02-10 DIAGNOSIS — D509 Iron deficiency anemia, unspecified: Secondary | ICD-10-CM | POA: Diagnosis not present

## 2024-02-10 DIAGNOSIS — I2510 Atherosclerotic heart disease of native coronary artery without angina pectoris: Secondary | ICD-10-CM | POA: Diagnosis not present

## 2024-02-10 DIAGNOSIS — I25118 Atherosclerotic heart disease of native coronary artery with other forms of angina pectoris: Secondary | ICD-10-CM | POA: Diagnosis not present

## 2024-02-10 DIAGNOSIS — I10 Essential (primary) hypertension: Secondary | ICD-10-CM | POA: Diagnosis not present

## 2024-02-12 DIAGNOSIS — E1151 Type 2 diabetes mellitus with diabetic peripheral angiopathy without gangrene: Secondary | ICD-10-CM | POA: Diagnosis not present

## 2024-02-12 DIAGNOSIS — J9611 Chronic respiratory failure with hypoxia: Secondary | ICD-10-CM | POA: Diagnosis not present

## 2024-02-12 DIAGNOSIS — I5042 Chronic combined systolic (congestive) and diastolic (congestive) heart failure: Secondary | ICD-10-CM | POA: Diagnosis not present

## 2024-02-12 DIAGNOSIS — D509 Iron deficiency anemia, unspecified: Secondary | ICD-10-CM | POA: Diagnosis not present

## 2024-03-06 DIAGNOSIS — J449 Chronic obstructive pulmonary disease, unspecified: Secondary | ICD-10-CM | POA: Diagnosis not present

## 2024-03-11 NOTE — Progress Notes (Unsigned)
 Referring Provider: Shona Norleen PEDLAR, MD Primary Care Physician:  Shona Norleen PEDLAR, MD Primary GI Physician: Dr. Cindie  Chief Complaint  Patient presents with   Follow-up    Follow up. No problem     HPI:   Gregory Alvarado is a 73 y.o. 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  Prior GI work-up: Colonoscopy August 2021 -inadequate preparation of the colon, two 6/8 mm polyps at the hepatic flexure and has declined ever having a colonoscopy again.    EGD August 2021 - normal esophagus s/p dilation, patchy gastric erythema, previously noted gastric ulcer completely healed, normal duodenum   EGD/enteroscopy November 2021 - multiple angioectasias in the duodenum and jejunum s/p APC therapy   EGD March 2023 - single recent bleeding angiodysplastic lesion in the stomach, 6 nonbleeding lesions in the duodenum, 6 nonbleeding lesions in the jejunum all treated with APC therapy.   Enteroscopy 09/28/2023: 3 nonbleeding angiodysplastic lesions in the stomach treated with APC, 8 nonbleeding angiodysplastic lesions in the duodenum treated with APC.     Last seen in the office 01/10/2024 Reported no recurrent GI bleeding.  No concerning GI symptoms.  Had just started taking iron  twice daily.  Reported occasional BC powder use.  Discussed referral to hematology for IV iron , but patient wanted to hold off as he just started oral iron .  Still waiting on response for octreotide  patient assistance.  Plan to update labs, continue iron  and PPI twice daily, follow-up in 8 weeks.  Labs completed 02/04/2024 with hemoglobin improved to  12.0.   Today:  Not on octreotide . Would need to apply for LES program and get denied prior to possible approval for assistance.   Reports he is doing well overall.  No BRBPR, melena.  No other GI concerns.  Denies abdominal pain, constipation, diarrhea, nausea, vomiting, GERD symptoms, dysphagia.  He is taking pantoprazole  40 mg twice daily. Also taking oral iron  daily.  NSAIDs: 1 BC powder daily for headaches.  States he was previously taking 3/day.  States nothing else helps him.  Has not discussed other options with PCP.   Past Medical History:  Diagnosis Date   Anemia    Arthritis    left shoulder   AVM (arteriovenous malformation) of small bowel, acquired    COPD (chronic obstructive pulmonary disease) (HCC)    Coronary artery disease 09/11/2020   NSTEMI and 3 stents in [facility]   Diabetes mellitus without complication (HCC)    Dyslipidemia    Hypertension    Insomnia    Insomnia    Morbid obesity (HCC)    Tobacco use     Past Surgical History:  Procedure Laterality Date   BIOPSY  10/28/2019   Procedure: BIOPSY;  Surgeon: Harvey Margo CROME, MD;  [facility];  Service: Endoscopy;;  gastric   BIOPSY  04/15/2020   Procedure: BIOPSY;  Surgeon: Shaaron Lamar HERO, MD;  [facility];  Service: Endoscopy;;  gastric   CARDIAC CATHETERIZATION  08/2020   3 stents placed   COLON RESECTION  1990s?   DIVERTICULITIS   COLONOSCOPY WITH PROPOFOL  N/A 04/15/2020   Rourk: Poor colon  prep. two 6 to 8 mm polyps removed from the hepatic flexure, tubular adenomas.  Repeat colonoscopy later this year.   ENTEROSCOPY N/A 11/26/2021   Procedure: ENTEROSCOPY;  Surgeon: Eartha Angelia Sieving, MD;  [facility];  Service: Gastroenterology;  Laterality: N/A;   ENTEROSCOPY N/A 03/13/2022   Procedure: ENTEROSCOPY;  Surgeon: Cindie Carlin POUR, DO;  [facility]: [facility];  Service: Endoscopy;  Laterality: N/A;   ENTEROSCOPY N/A 09/28/2023   Procedure: ENTEROSCOPY;   Surgeon: Eartha Angelia Sieving, MD;  [facility];  Service: Gastroenterology;  Laterality: N/A;   ESOPHAGOGASTRODUODENOSCOPY (EGD) WITH PROPOFOL  N/A 10/28/2019   Dr. Harvey: Multiple cratered gastric ulcers, gastritis/duodenitis.  Path showed H. pylori.  Patient treated with amoxicillin /Biaxin /PPI twice daily.   ESOPHAGOGASTRODUODENOSCOPY (EGD) WITH PROPOFOL  N/A 04/15/2020   Rourk: Patchy gastric erythema status post biopsy to document eradication of H. pylori.  Biopsy showed persistent H. pylori.  Previously noted gastric ulcer is completely healed.   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   ESOPHAGOGASTRODUODENOSCOPY (EGD) WITH PROPOFOL  N/A 03/13/2022   Procedure: ESOPHAGOGASTRODUODENOSCOPY (EGD) WITH PROPOFOL ;  Surgeon: Cindie Carlin POUR, DO;  [facility];  Service: Endoscopy;  Laterality: N/A;   HERNIA REPAIR     UHR   HOT HEMOSTASIS  11/26/2021   Procedure: HOT HEMOSTASIS (ARGON PLASMA COAGULATION/BICAP);  Surgeon: Eartha Angelia, Sieving, MD;  [facility];  Service: Gastroenterology;;   HOT HEMOSTASIS  09/28/2023   Procedure: HOT HEMOSTASIS (ARGON PLASMA COAGULATION/BICAP);  Surgeon: Eartha Angelia, Sieving, MD;  [facility];  Service: Gastroenterology;;   AGAPITO DILATION N/A 04/15/2020   Procedure: AGAPITO HODGKIN;  Surgeon: Shaaron Lamar HERO, MD;  [facility];  Service: Endoscopy;  Laterality: N/A;   POLYPECTOMY  04/15/2020   Procedure: POLYPECTOMY;  Surgeon: Shaaron Lamar HERO, MD;  [facility];  Service: Endoscopy;;  colon   ROTATOR CUFF REPAIR Bilateral    TYMPANOPLASTY      Current Outpatient Medications  Medication Sig Dispense Refill   acetaminophen  (TYLENOL ) 500 MG tablet Take 1,000 mg by mouth every 6 (six) hours as needed for mild pain.     Ascorbic Acid (VITAMIN C ) 500 MG CAPS Take 500 mg by  mouth daily. Take with iron  tablet for better iron  absorption. 30 capsule 1   BREZTRI  AEROSPHERE 160-9-4.8 MCG/ACT AERO Inhale 2 puffs into the lungs 2 (two) times daily.     cyanocobalamin  (,VITAMIN B-12,) 1000 MCG/ML injection Inject 1,000 mcg into the muscle every 30 (thirty) days.     diphenhydramine -acetaminophen  (TYLENOL  PM) 25-500 MG TABS tablet Take 2 tablets by mouth at bedtime.     empagliflozin  (JARDIANCE ) 10 MG TABS tablet Take 1 tablet (10 mg total) by mouth daily. 30 tablet 0   insulin  NPH-regular Human (70-30) 100 UNIT/ML injection Inject 15 Units into the skin in the morning and at bedtime.     Iron , Ferrous Sulfate , 325 (65 Fe) MG TABS Take 1 tablet by mouth daily in the afternoon. 30 tablet 1   levocetirizine (XYZAL ) 5 MG tablet Take 5 mg by mouth at bedtime.     metoprolol  succinate (TOPROL -XL) 25 MG 24 hr tablet Take 1 tablet (25 mg total) by mouth 2 (two) times daily. 180 tablet 3   nitroGLYCERIN  (NITROSTAT ) 0.4 MG SL tablet DISSOLVE 1 TABLET SUBLINGUALLY AS NEEDED FOR CHEST PAIN, MAY REPEAT EVERY 5 MINUTES. AFTER 3 CALL  911. 25 tablet 3   pantoprazole  (PROTONIX ) 40 MG tablet Take 1 tablet (40 mg total) by mouth 2 (two) times daily. 60 tablet 5   pregabalin  (LYRICA ) 25 MG capsule Take 25 mg by mouth 2 (two) times daily.     rosuvastatin  (CRESTOR ) 40 MG tablet Take 1 tablet (40 mg total) by mouth daily. 90 tablet 3   spironolactone  (ALDACTONE ) 25 MG tablet Take 0.5 tablets (12.5 mg total) by mouth daily. 30 tablet 1   Syringe/Needle, Disp, (SYRINGE 3CC/25GX5/8) 25G X 5/8 3 ML MISC Inject 1 mL in the morning and at bedtime. 100 each 3   traMADol  (ULTRAM ) 50 MG tablet Take 50-100 mg by mouth in the morning and at bedtime.     zolpidem  (AMBIEN ) 5 MG tablet Take 5 mg by mouth at bedtime.     No current facility-administered medications for this visit.    Allergies as of 03/13/2024 - Review Complete 03/13/2024  Allergen Reaction Noted   Lisinopril  Swelling 07/26/2022   Caduet  [amlodipine-atorvastatin ] Swelling 09/18/2015    Family History  Problem Relation Age of Onset   Diabetes Mother    Heart attack Mother    Heart attack Father    Heart attack Brother    Colon cancer Neg Hx    Stomach cancer Neg Hx     Social History   Socioeconomic History   Marital status: Divorced    Spouse name: Not on file   Number of children: 2   Years of education: Not on file   Highest education level: High school graduate  Occupational History   Occupation: Retired  Tobacco Use   Smoking status: Every Day    Current packs/day: 2.00    Types: Cigarettes   Smokeless tobacco: Never  Vaping Use   Vaping status: Never Used  Substance and Sexual Activity   Alcohol use: Not Currently    Comment: very rare   Drug use: No   Sexual activity: Not on file  Other Topics Concern   Not on file  Social History Narrative   Not on file   Social Drivers of Health   Financial Resource Strain: Low Risk  (08/06/2020)   Overall Financial Resource Strain (CARDIA)    Difficulty of Paying Living Expenses: Not very hard  Food Insecurity: No Food Insecurity (12/25/2023)   Hunger Vital Sign    Worried About Running Out of Food in the Last Year: Never true    Ran Out of Food in the Last Year: Never true  Transportation Needs: No Transportation Needs (12/25/2023)   PRAPARE - Administrator, Civil Service (Medical): No    Lack of Transportation (Non-Medical): No  Physical Activity: Inactive (08/06/2020)   Exercise Vital Sign    Days of Exercise per Week: 0 days    Minutes of Exercise per Session: 0 min  Stress: No Stress Concern Present (08/06/2020)   [employer] of Occupational Health - Occupational Stress Questionnaire    Feeling of Stress : Not at all  Social Connections: Socially Isolated (12/25/2023)   Social Connection and Isolation Panel    Frequency of Communication with Friends and Family: More than three times a week    Frequency of Social Gatherings  with Friends and Family: More than three times a week    Attends Religious Services: Never    Database administrator or Organizations: No    Attends Banker Meetings: Never    Marital Status: Divorced    Review  of Systems: Gen: Denies fever, chills, cold or flulike symptoms, presyncope, syncope. CV: Denies chest pain, palpitations. Resp: Denies dyspnea, cough. GI: See HPI Heme: See HPI  Physical Exam: BP 122/65 (BP Location: Left Arm, Patient Position: Sitting, Cuff Size: Large)   Pulse 89   Temp 97.7 F (36.5 C) (Temporal)   Ht 5' 11 (1.803 m)   Wt 253 lb 8 oz (115 kg)   BMI 35.36 kg/m  General:   Alert and oriented. No distress noted. Pleasant and cooperative.  Head:  Normocephalic and atraumatic. Eyes:  Conjuctiva clear without scleral icterus. Abdomen:  +BS, soft, non-tender and non-distended. No rebound or guarding. Limited exam with patient in wheel chair.  Msk:  Symmetrical without gross deformities. Normal posture. Neurologic:  Alert and  oriented x4 Psych:  Normal mood and affect.    Assessment:  73 y.o. 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   Clinically he is doing well with no overt GI bleeding. We have tried to obtain subcu octreotide , but patient was unable to obtain this due to cost.  We submitted for patient assistance, but patient would have to apply for LES program and get denied prior to possible approval for assistance for SQ dosing; however patient declined and preferred to hold off on this medication if possible.  Also previously recommended referral to hematology for IV  iron  and close monitoring of his hemoglobin, but patient declined.  He started oral iron  daily in April 2025 and encouragingly, hemoglobin has improved to 12.0 on 02/04/2024.  I do note that he is still taking 1 BC powder daily. We had extensive discussion today about the importance of strict NSAID avoidance. Recommended follow-up with PCP to discuss other options for headache management.   At this point, we will continue to hold off on octreotide  and referral to hematology per patient's request as hemoglobin is stable.  If hemoglobin declines, would recommend pursuing IM long-acting octreotide  which patient could receive once a month.    Plan:  CBC, iron  panel Continue oral iron  daily. Continue pantoprazole  40 mg BID.  Strict NSAID avoidance.  Follow-up with PCP for headache management.  If Hgb declines, recommend trying to get long acting IM octreotide  approved and referral to hematology.  Follow-up in3 months or sooner if needed.    Josette Centers, PA-C [facility] 03/13/2024

## 2024-03-13 ENCOUNTER — Ambulatory Visit: Admitting: Gastroenterology

## 2024-03-13 ENCOUNTER — Encounter: Payer: Self-pay | Admitting: Gastroenterology

## 2024-03-13 VITALS — BP 122/65 | HR 89 | Temp 97.7°F | Ht 71.0 in | Wt 253.5 lb

## 2024-03-13 DIAGNOSIS — D649 Anemia, unspecified: Secondary | ICD-10-CM | POA: Diagnosis not present

## 2024-03-13 DIAGNOSIS — D509 Iron deficiency anemia, unspecified: Secondary | ICD-10-CM | POA: Diagnosis not present

## 2024-03-13 DIAGNOSIS — E782 Mixed hyperlipidemia: Secondary | ICD-10-CM | POA: Diagnosis not present

## 2024-03-13 DIAGNOSIS — I110 Hypertensive heart disease with heart failure: Secondary | ICD-10-CM | POA: Diagnosis not present

## 2024-03-13 DIAGNOSIS — D500 Iron deficiency anemia secondary to blood loss (chronic): Secondary | ICD-10-CM

## 2024-03-13 DIAGNOSIS — I5042 Chronic combined systolic (congestive) and diastolic (congestive) heart failure: Secondary | ICD-10-CM | POA: Diagnosis not present

## 2024-03-13 DIAGNOSIS — K5520 Angiodysplasia of colon without hemorrhage: Secondary | ICD-10-CM

## 2024-03-13 NOTE — Patient Instructions (Signed)
 Please have your blood work completed with your primary care doctor to recheck your hemoglobin and iron  levels.  Continue oral iron  daily.   I recommend that you stop using BC powders altogether and follow-up with your primary care doctor to discuss other options for headache management. Along with BC powders, it is important to avoid ibuprofen, Aleve , Advil, Goody powders, and anything that says NSAID on the package as these items can cause GI bleeding.  Continue pantoprazole  twice a day.   I will see you back in the office in 3 months or sooner if needed.  Josette Centers, PA-C [facility]

## 2024-03-14 ENCOUNTER — Encounter: Payer: Self-pay | Admitting: Gastroenterology

## 2024-04-05 DIAGNOSIS — J449 Chronic obstructive pulmonary disease, unspecified: Secondary | ICD-10-CM | POA: Diagnosis not present

## 2024-05-06 DIAGNOSIS — J449 Chronic obstructive pulmonary disease, unspecified: Secondary | ICD-10-CM | POA: Diagnosis not present

## 2024-05-12 ENCOUNTER — Encounter: Payer: Self-pay | Admitting: Gastroenterology

## 2024-05-24 DIAGNOSIS — E1169 Type 2 diabetes mellitus with other specified complication: Secondary | ICD-10-CM | POA: Diagnosis not present

## 2024-05-24 DIAGNOSIS — E782 Mixed hyperlipidemia: Secondary | ICD-10-CM | POA: Diagnosis not present

## 2024-05-24 DIAGNOSIS — E538 Deficiency of other specified B group vitamins: Secondary | ICD-10-CM | POA: Diagnosis not present

## 2024-06-01 ENCOUNTER — Encounter: Payer: Self-pay | Admitting: Internal Medicine

## 2024-06-01 DIAGNOSIS — D6949 Other primary thrombocytopenia: Secondary | ICD-10-CM | POA: Diagnosis not present

## 2024-06-01 DIAGNOSIS — D509 Iron deficiency anemia, unspecified: Secondary | ICD-10-CM | POA: Diagnosis not present

## 2024-06-01 DIAGNOSIS — E1151 Type 2 diabetes mellitus with diabetic peripheral angiopathy without gangrene: Secondary | ICD-10-CM | POA: Diagnosis not present

## 2024-06-01 DIAGNOSIS — M545 Low back pain, unspecified: Secondary | ICD-10-CM | POA: Diagnosis not present

## 2024-06-01 DIAGNOSIS — I110 Hypertensive heart disease with heart failure: Secondary | ICD-10-CM | POA: Diagnosis not present

## 2024-06-01 DIAGNOSIS — E1165 Type 2 diabetes mellitus with hyperglycemia: Secondary | ICD-10-CM | POA: Diagnosis not present

## 2024-06-01 DIAGNOSIS — I5042 Chronic combined systolic (congestive) and diastolic (congestive) heart failure: Secondary | ICD-10-CM | POA: Diagnosis not present

## 2024-06-01 DIAGNOSIS — J449 Chronic obstructive pulmonary disease, unspecified: Secondary | ICD-10-CM | POA: Diagnosis not present

## 2024-06-01 DIAGNOSIS — R944 Abnormal results of kidney function studies: Secondary | ICD-10-CM | POA: Diagnosis not present

## 2024-06-01 DIAGNOSIS — G629 Polyneuropathy, unspecified: Secondary | ICD-10-CM | POA: Diagnosis not present

## 2024-06-01 DIAGNOSIS — I3139 Other pericardial effusion (noninflammatory): Secondary | ICD-10-CM | POA: Diagnosis not present

## 2024-06-01 DIAGNOSIS — J9611 Chronic respiratory failure with hypoxia: Secondary | ICD-10-CM | POA: Diagnosis not present

## 2024-06-06 DIAGNOSIS — J449 Chronic obstructive pulmonary disease, unspecified: Secondary | ICD-10-CM | POA: Diagnosis not present

## 2024-07-14 DIAGNOSIS — E1169 Type 2 diabetes mellitus with other specified complication: Secondary | ICD-10-CM | POA: Diagnosis not present

## 2024-07-14 DIAGNOSIS — I110 Hypertensive heart disease with heart failure: Secondary | ICD-10-CM | POA: Diagnosis not present

## 2024-07-14 DIAGNOSIS — E782 Mixed hyperlipidemia: Secondary | ICD-10-CM | POA: Diagnosis not present

## 2024-07-14 DIAGNOSIS — I5042 Chronic combined systolic (congestive) and diastolic (congestive) heart failure: Secondary | ICD-10-CM | POA: Diagnosis not present

## 2024-07-17 ENCOUNTER — Other Ambulatory Visit: Payer: Self-pay | Admitting: Cardiology

## 2024-07-17 DIAGNOSIS — J449 Chronic obstructive pulmonary disease, unspecified: Secondary | ICD-10-CM | POA: Diagnosis not present

## 2024-07-19 ENCOUNTER — Encounter (INDEPENDENT_AMBULATORY_CARE_PROVIDER_SITE_OTHER): Payer: Self-pay | Admitting: Gastroenterology

## 2024-07-29 ENCOUNTER — Telehealth: Payer: Self-pay | Admitting: Gastroenterology

## 2024-07-29 NOTE — Telephone Encounter (Signed)
 Patient last seen by [facility]. Labs from 05/2024 with normal H/H. History of IDA. Last two set of labs, with mild thrombocytopenia.   He was mailed letter to make OV but no response.  He needs ov with CM, or LSL we have both seen him previously.

## 2024-08-13 DIAGNOSIS — E782 Mixed hyperlipidemia: Secondary | ICD-10-CM | POA: Diagnosis not present

## 2024-08-13 DIAGNOSIS — I5042 Chronic combined systolic (congestive) and diastolic (congestive) heart failure: Secondary | ICD-10-CM | POA: Diagnosis not present

## 2024-08-13 DIAGNOSIS — E1151 Type 2 diabetes mellitus with diabetic peripheral angiopathy without gangrene: Secondary | ICD-10-CM | POA: Diagnosis not present

## 2024-08-13 DIAGNOSIS — I110 Hypertensive heart disease with heart failure: Secondary | ICD-10-CM | POA: Diagnosis not present

## 2024-09-25 ENCOUNTER — Ambulatory Visit: Admitting: Gastroenterology

## 2024-09-27 ENCOUNTER — Encounter: Payer: Self-pay | Admitting: Gastroenterology

## 2024-10-03 ENCOUNTER — Other Ambulatory Visit: Payer: Self-pay | Admitting: Gastroenterology

## 2024-10-03 DIAGNOSIS — K922 Gastrointestinal hemorrhage, unspecified: Secondary | ICD-10-CM

## 2024-10-03 DIAGNOSIS — D500 Iron deficiency anemia secondary to blood loss (chronic): Secondary | ICD-10-CM

## 2024-10-07 ENCOUNTER — Other Ambulatory Visit: Payer: Self-pay | Admitting: Cardiology
